# Patient Record
Sex: Female | Born: 1955
Health system: Southern US, Community
[De-identification: ages and names within clinical notes are randomized; demographics above are authoritative.]

## PROBLEM LIST (undated history)

## (undated) DIAGNOSIS — R7989 Other specified abnormal findings of blood chemistry: Secondary | ICD-10-CM

## (undated) DIAGNOSIS — Z95810 Presence of automatic (implantable) cardiac defibrillator: Secondary | ICD-10-CM

## (undated) DIAGNOSIS — I2510 Atherosclerotic heart disease of native coronary artery without angina pectoris: Secondary | ICD-10-CM

## (undated) DIAGNOSIS — I509 Heart failure, unspecified: Secondary | ICD-10-CM

## (undated) DIAGNOSIS — R519 Headache, unspecified: Secondary | ICD-10-CM

## (undated) DIAGNOSIS — F419 Anxiety disorder, unspecified: Secondary | ICD-10-CM

## (undated) DIAGNOSIS — E785 Hyperlipidemia, unspecified: Secondary | ICD-10-CM

## (undated) DIAGNOSIS — D126 Benign neoplasm of colon, unspecified: Secondary | ICD-10-CM

## (undated) DIAGNOSIS — R945 Abnormal results of liver function studies: Secondary | ICD-10-CM

## (undated) DIAGNOSIS — R413 Other amnesia: Secondary | ICD-10-CM

## (undated) DIAGNOSIS — R51 Headache: Secondary | ICD-10-CM

## (undated) DIAGNOSIS — Q2381 Bicuspid aortic valve: Secondary | ICD-10-CM

## (undated) DIAGNOSIS — Q231 Congenital insufficiency of aortic valve: Secondary | ICD-10-CM

## (undated) DIAGNOSIS — R296 Repeated falls: Secondary | ICD-10-CM

## (undated) DIAGNOSIS — I10 Essential (primary) hypertension: Secondary | ICD-10-CM

## (undated) DIAGNOSIS — R011 Cardiac murmur, unspecified: Secondary | ICD-10-CM

## (undated) DIAGNOSIS — I429 Cardiomyopathy, unspecified: Secondary | ICD-10-CM

## (undated) DIAGNOSIS — I350 Nonrheumatic aortic (valve) stenosis: Secondary | ICD-10-CM

## (undated) DIAGNOSIS — F32A Depression, unspecified: Secondary | ICD-10-CM

## (undated) DIAGNOSIS — F329 Major depressive disorder, single episode, unspecified: Secondary | ICD-10-CM

## (undated) HISTORY — DX: Benign neoplasm of colon, unspecified: D12.6

## (undated) HISTORY — DX: Nonrheumatic aortic (valve) stenosis: I35.0

## (undated) HISTORY — DX: Abnormal results of liver function studies: R94.5

## (undated) HISTORY — DX: Cardiomyopathy, unspecified: I42.9

## (undated) HISTORY — DX: Hypercalcemia: E83.52

## (undated) HISTORY — DX: Hyperlipidemia, unspecified: E78.5

## (undated) HISTORY — DX: Presence of automatic (implantable) cardiac defibrillator: Z95.810

## (undated) HISTORY — DX: Essential (primary) hypertension: I10

## (undated) HISTORY — PX: CHOLECYSTECTOMY: SHX55

## (undated) HISTORY — PX: OTHER SURGICAL HISTORY: SHX169

## (undated) HISTORY — DX: Repeated falls: R29.6

## (undated) HISTORY — DX: Other specified abnormal findings of blood chemistry: R79.89

## (undated) HISTORY — DX: Heart failure, unspecified: I50.9

## (undated) HISTORY — DX: Congenital insufficiency of aortic valve: Q23.1

## (undated) HISTORY — DX: Bicuspid aortic valve: Q23.81

## (undated) HISTORY — DX: Other amnesia: R41.3

---

## 1982-09-25 HISTORY — PX: BREAST EXCISIONAL BIOPSY: SUR124

## 1997-12-29 ENCOUNTER — Other Ambulatory Visit: Admission: RE | Admit: 1997-12-29 | Discharge: 1997-12-29 | Payer: Self-pay | Admitting: Gynecology

## 1999-04-28 ENCOUNTER — Other Ambulatory Visit: Admission: RE | Admit: 1999-04-28 | Discharge: 1999-04-28 | Payer: Self-pay | Admitting: Gynecology

## 2000-03-08 ENCOUNTER — Encounter: Payer: Self-pay | Admitting: Gynecology

## 2000-03-08 ENCOUNTER — Ambulatory Visit (HOSPITAL_COMMUNITY): Admission: RE | Admit: 2000-03-08 | Discharge: 2000-03-08 | Payer: Self-pay | Admitting: Gynecology

## 2000-07-25 ENCOUNTER — Other Ambulatory Visit: Admission: RE | Admit: 2000-07-25 | Discharge: 2000-07-25 | Payer: Self-pay | Admitting: Gynecology

## 2001-02-13 ENCOUNTER — Encounter (INDEPENDENT_AMBULATORY_CARE_PROVIDER_SITE_OTHER): Payer: Self-pay | Admitting: Specialist

## 2001-02-13 ENCOUNTER — Other Ambulatory Visit: Admission: RE | Admit: 2001-02-13 | Discharge: 2001-02-13 | Payer: Self-pay | Admitting: Gynecology

## 2001-03-11 ENCOUNTER — Encounter: Payer: Self-pay | Admitting: Gynecology

## 2001-03-11 ENCOUNTER — Ambulatory Visit (HOSPITAL_COMMUNITY): Admission: RE | Admit: 2001-03-11 | Discharge: 2001-03-11 | Payer: Self-pay | Admitting: Gynecology

## 2001-04-19 ENCOUNTER — Encounter (INDEPENDENT_AMBULATORY_CARE_PROVIDER_SITE_OTHER): Payer: Self-pay | Admitting: Specialist

## 2001-04-19 ENCOUNTER — Ambulatory Visit (HOSPITAL_COMMUNITY): Admission: RE | Admit: 2001-04-19 | Discharge: 2001-04-19 | Payer: Self-pay | Admitting: Gastroenterology

## 2001-04-19 DIAGNOSIS — D126 Benign neoplasm of colon, unspecified: Secondary | ICD-10-CM

## 2001-04-19 HISTORY — DX: Benign neoplasm of colon, unspecified: D12.6

## 2001-09-25 HISTORY — PX: OTHER SURGICAL HISTORY: SHX169

## 2002-06-19 ENCOUNTER — Inpatient Hospital Stay (HOSPITAL_COMMUNITY): Admission: EM | Admit: 2002-06-19 | Discharge: 2002-06-24 | Payer: Self-pay | Admitting: Emergency Medicine

## 2002-06-19 ENCOUNTER — Encounter: Payer: Self-pay | Admitting: Emergency Medicine

## 2002-06-20 ENCOUNTER — Encounter (INDEPENDENT_AMBULATORY_CARE_PROVIDER_SITE_OTHER): Payer: Self-pay | Admitting: Cardiology

## 2002-07-05 ENCOUNTER — Encounter: Payer: Self-pay | Admitting: *Deleted

## 2002-07-05 ENCOUNTER — Emergency Department (HOSPITAL_COMMUNITY): Admission: EM | Admit: 2002-07-05 | Discharge: 2002-07-05 | Payer: Self-pay | Admitting: *Deleted

## 2002-07-16 ENCOUNTER — Other Ambulatory Visit: Admission: RE | Admit: 2002-07-16 | Discharge: 2002-07-16 | Payer: Self-pay | Admitting: Gynecology

## 2003-03-03 ENCOUNTER — Other Ambulatory Visit: Admission: RE | Admit: 2003-03-03 | Discharge: 2003-03-03 | Payer: Self-pay | Admitting: Gynecology

## 2003-10-08 ENCOUNTER — Other Ambulatory Visit: Admission: RE | Admit: 2003-10-08 | Discharge: 2003-10-08 | Payer: Self-pay | Admitting: Gynecology

## 2004-04-18 ENCOUNTER — Ambulatory Visit (HOSPITAL_COMMUNITY): Admission: RE | Admit: 2004-04-18 | Discharge: 2004-04-18 | Payer: Self-pay | Admitting: Family Medicine

## 2004-09-05 ENCOUNTER — Ambulatory Visit: Payer: Self-pay | Admitting: Gastroenterology

## 2004-09-05 ENCOUNTER — Inpatient Hospital Stay (HOSPITAL_COMMUNITY): Admission: EM | Admit: 2004-09-05 | Discharge: 2004-09-07 | Payer: Self-pay | Admitting: Gastroenterology

## 2004-09-05 ENCOUNTER — Ambulatory Visit: Payer: Self-pay | Admitting: Internal Medicine

## 2004-09-06 ENCOUNTER — Encounter (INDEPENDENT_AMBULATORY_CARE_PROVIDER_SITE_OTHER): Payer: Self-pay | Admitting: *Deleted

## 2004-09-30 ENCOUNTER — Ambulatory Visit: Payer: Self-pay | Admitting: Internal Medicine

## 2004-10-06 ENCOUNTER — Inpatient Hospital Stay (HOSPITAL_COMMUNITY): Admission: EM | Admit: 2004-10-06 | Discharge: 2004-10-08 | Payer: Self-pay | Admitting: Emergency Medicine

## 2005-03-08 ENCOUNTER — Ambulatory Visit (HOSPITAL_COMMUNITY): Admission: RE | Admit: 2005-03-08 | Discharge: 2005-03-08 | Payer: Self-pay | Admitting: Cardiology

## 2005-03-09 ENCOUNTER — Ambulatory Visit: Payer: Self-pay | Admitting: Internal Medicine

## 2005-03-21 ENCOUNTER — Ambulatory Visit: Payer: Self-pay | Admitting: Internal Medicine

## 2005-03-24 ENCOUNTER — Inpatient Hospital Stay (HOSPITAL_COMMUNITY): Admission: RE | Admit: 2005-03-24 | Discharge: 2005-03-25 | Payer: Self-pay | Admitting: Internal Medicine

## 2005-03-24 ENCOUNTER — Ambulatory Visit: Payer: Self-pay | Admitting: Internal Medicine

## 2005-03-31 ENCOUNTER — Ambulatory Visit: Payer: Self-pay | Admitting: Cardiology

## 2005-04-12 ENCOUNTER — Ambulatory Visit: Payer: Self-pay

## 2005-04-25 ENCOUNTER — Ambulatory Visit: Payer: Self-pay

## 2005-04-28 ENCOUNTER — Ambulatory Visit: Payer: Self-pay

## 2005-05-16 ENCOUNTER — Ambulatory Visit: Payer: Self-pay | Admitting: Cardiology

## 2005-05-31 ENCOUNTER — Ambulatory Visit: Payer: Self-pay | Admitting: Cardiology

## 2005-06-02 ENCOUNTER — Ambulatory Visit: Payer: Self-pay | Admitting: Internal Medicine

## 2005-06-19 ENCOUNTER — Ambulatory Visit: Payer: Self-pay | Admitting: Internal Medicine

## 2005-07-14 ENCOUNTER — Ambulatory Visit: Payer: Self-pay | Admitting: Internal Medicine

## 2005-07-14 ENCOUNTER — Ambulatory Visit: Payer: Self-pay | Admitting: Cardiovascular Disease

## 2005-09-19 ENCOUNTER — Ambulatory Visit: Payer: Self-pay | Admitting: Internal Medicine

## 2005-10-11 ENCOUNTER — Ambulatory Visit: Payer: Self-pay | Admitting: Internal Medicine

## 2005-10-17 ENCOUNTER — Ambulatory Visit: Payer: Self-pay | Admitting: *Deleted

## 2005-11-02 ENCOUNTER — Encounter: Admission: RE | Admit: 2005-11-02 | Discharge: 2005-11-02 | Payer: Self-pay | Admitting: Family Medicine

## 2005-12-26 ENCOUNTER — Ambulatory Visit (HOSPITAL_COMMUNITY): Admission: RE | Admit: 2005-12-26 | Discharge: 2005-12-26 | Payer: Self-pay | Admitting: Family Medicine

## 2006-04-12 ENCOUNTER — Ambulatory Visit: Payer: Self-pay | Admitting: Internal Medicine

## 2006-07-12 ENCOUNTER — Ambulatory Visit: Payer: Self-pay | Admitting: Internal Medicine

## 2006-10-11 ENCOUNTER — Ambulatory Visit: Payer: Self-pay | Admitting: Internal Medicine

## 2006-11-26 ENCOUNTER — Ambulatory Visit: Payer: Self-pay | Admitting: Family Medicine

## 2006-12-10 ENCOUNTER — Ambulatory Visit: Payer: Self-pay | Admitting: Family Medicine

## 2006-12-27 ENCOUNTER — Ambulatory Visit: Payer: Self-pay | Admitting: Family Medicine

## 2007-01-04 ENCOUNTER — Ambulatory Visit: Payer: Self-pay | Admitting: Internal Medicine

## 2007-01-07 ENCOUNTER — Ambulatory Visit: Payer: Self-pay | Admitting: *Deleted

## 2007-01-09 ENCOUNTER — Ambulatory Visit: Payer: Self-pay | Admitting: Family Medicine

## 2007-01-10 ENCOUNTER — Ambulatory Visit: Payer: Self-pay | Admitting: Internal Medicine

## 2007-01-16 ENCOUNTER — Ambulatory Visit (HOSPITAL_COMMUNITY): Admission: RE | Admit: 2007-01-16 | Discharge: 2007-01-16 | Payer: Self-pay | Admitting: Family Medicine

## 2007-04-12 ENCOUNTER — Ambulatory Visit: Payer: Self-pay | Admitting: Internal Medicine

## 2007-06-27 ENCOUNTER — Ambulatory Visit: Payer: Self-pay | Admitting: Cardiology

## 2007-07-04 ENCOUNTER — Ambulatory Visit: Payer: Self-pay | Admitting: Cardiology

## 2007-07-11 ENCOUNTER — Ambulatory Visit: Payer: Self-pay | Admitting: Internal Medicine

## 2007-10-10 ENCOUNTER — Ambulatory Visit: Payer: Self-pay | Admitting: Internal Medicine

## 2007-11-13 ENCOUNTER — Ambulatory Visit: Payer: Self-pay | Admitting: Gastroenterology

## 2007-12-25 ENCOUNTER — Ambulatory Visit: Payer: Self-pay | Admitting: Gastroenterology

## 2008-01-09 ENCOUNTER — Ambulatory Visit: Payer: Self-pay | Admitting: Internal Medicine

## 2008-01-23 ENCOUNTER — Ambulatory Visit (HOSPITAL_COMMUNITY): Admission: RE | Admit: 2008-01-23 | Discharge: 2008-01-23 | Payer: Self-pay | Admitting: Family Medicine

## 2008-04-28 ENCOUNTER — Ambulatory Visit: Payer: Self-pay | Admitting: Internal Medicine

## 2008-05-05 ENCOUNTER — Ambulatory Visit: Payer: Self-pay

## 2008-05-05 ENCOUNTER — Encounter: Payer: Self-pay | Admitting: Internal Medicine

## 2008-07-30 ENCOUNTER — Ambulatory Visit: Payer: Self-pay | Admitting: Internal Medicine

## 2008-11-03 ENCOUNTER — Encounter: Payer: Self-pay | Admitting: Internal Medicine

## 2008-11-17 ENCOUNTER — Ambulatory Visit: Payer: Self-pay | Admitting: Internal Medicine

## 2009-02-12 ENCOUNTER — Telehealth: Payer: Self-pay | Admitting: Internal Medicine

## 2009-02-16 ENCOUNTER — Telehealth (INDEPENDENT_AMBULATORY_CARE_PROVIDER_SITE_OTHER): Payer: Self-pay | Admitting: *Deleted

## 2009-02-18 ENCOUNTER — Ambulatory Visit: Payer: Self-pay | Admitting: Internal Medicine

## 2009-03-12 ENCOUNTER — Encounter: Payer: Self-pay | Admitting: Internal Medicine

## 2009-03-18 ENCOUNTER — Telehealth (INDEPENDENT_AMBULATORY_CARE_PROVIDER_SITE_OTHER): Payer: Self-pay | Admitting: *Deleted

## 2009-04-13 ENCOUNTER — Telehealth: Payer: Self-pay | Admitting: Internal Medicine

## 2009-05-29 DIAGNOSIS — F17200 Nicotine dependence, unspecified, uncomplicated: Secondary | ICD-10-CM | POA: Insufficient documentation

## 2009-05-29 DIAGNOSIS — Q231 Congenital insufficiency of aortic valve: Secondary | ICD-10-CM | POA: Insufficient documentation

## 2009-05-29 DIAGNOSIS — Z862 Personal history of diseases of the blood and blood-forming organs and certain disorders involving the immune mechanism: Secondary | ICD-10-CM | POA: Insufficient documentation

## 2009-05-29 DIAGNOSIS — I359 Nonrheumatic aortic valve disorder, unspecified: Secondary | ICD-10-CM | POA: Insufficient documentation

## 2009-05-29 DIAGNOSIS — I429 Cardiomyopathy, unspecified: Secondary | ICD-10-CM | POA: Insufficient documentation

## 2009-05-29 DIAGNOSIS — I509 Heart failure, unspecified: Secondary | ICD-10-CM | POA: Insufficient documentation

## 2009-05-29 DIAGNOSIS — I10 Essential (primary) hypertension: Secondary | ICD-10-CM | POA: Insufficient documentation

## 2009-05-29 DIAGNOSIS — E785 Hyperlipidemia, unspecified: Secondary | ICD-10-CM | POA: Insufficient documentation

## 2009-05-29 DIAGNOSIS — Z8719 Personal history of other diseases of the digestive system: Secondary | ICD-10-CM | POA: Insufficient documentation

## 2009-05-29 DIAGNOSIS — Z8639 Personal history of other endocrine, nutritional and metabolic disease: Secondary | ICD-10-CM

## 2009-06-02 ENCOUNTER — Encounter: Payer: Self-pay | Admitting: Internal Medicine

## 2009-06-02 ENCOUNTER — Ambulatory Visit: Payer: Self-pay

## 2009-06-02 ENCOUNTER — Ambulatory Visit: Payer: Self-pay | Admitting: Internal Medicine

## 2009-06-02 DIAGNOSIS — Z95810 Presence of automatic (implantable) cardiac defibrillator: Secondary | ICD-10-CM | POA: Insufficient documentation

## 2009-06-10 ENCOUNTER — Ambulatory Visit (HOSPITAL_COMMUNITY): Admission: RE | Admit: 2009-06-10 | Discharge: 2009-06-10 | Payer: Self-pay | Admitting: Family Medicine

## 2009-08-19 ENCOUNTER — Ambulatory Visit: Payer: Self-pay | Admitting: Internal Medicine

## 2009-08-20 ENCOUNTER — Encounter: Payer: Self-pay | Admitting: Internal Medicine

## 2009-11-19 ENCOUNTER — Encounter: Payer: Self-pay | Admitting: Internal Medicine

## 2009-12-02 ENCOUNTER — Ambulatory Visit: Payer: Self-pay | Admitting: Internal Medicine

## 2010-01-24 ENCOUNTER — Telehealth (INDEPENDENT_AMBULATORY_CARE_PROVIDER_SITE_OTHER): Payer: Self-pay | Admitting: *Deleted

## 2010-02-14 ENCOUNTER — Telehealth (INDEPENDENT_AMBULATORY_CARE_PROVIDER_SITE_OTHER): Payer: Self-pay | Admitting: *Deleted

## 2010-03-10 ENCOUNTER — Ambulatory Visit: Payer: Self-pay | Admitting: Internal Medicine

## 2010-03-30 ENCOUNTER — Encounter: Payer: Self-pay | Admitting: Internal Medicine

## 2010-06-14 ENCOUNTER — Ambulatory Visit (HOSPITAL_COMMUNITY): Admission: RE | Admit: 2010-06-14 | Discharge: 2010-06-14 | Payer: Self-pay | Admitting: Family Medicine

## 2010-06-20 ENCOUNTER — Telehealth: Payer: Self-pay | Admitting: Internal Medicine

## 2010-06-22 ENCOUNTER — Encounter: Admission: RE | Admit: 2010-06-22 | Discharge: 2010-06-22 | Payer: Self-pay | Admitting: Family Medicine

## 2010-06-29 ENCOUNTER — Ambulatory Visit: Payer: Self-pay | Admitting: Internal Medicine

## 2010-09-29 ENCOUNTER — Ambulatory Visit
Admission: RE | Admit: 2010-09-29 | Discharge: 2010-09-29 | Payer: Self-pay | Source: Home / Self Care | Attending: Internal Medicine | Admitting: Internal Medicine

## 2010-09-29 ENCOUNTER — Encounter: Payer: Self-pay | Admitting: Internal Medicine

## 2010-09-29 ENCOUNTER — Telehealth: Payer: Self-pay | Admitting: Internal Medicine

## 2010-10-15 ENCOUNTER — Encounter: Payer: Self-pay | Admitting: Family Medicine

## 2010-10-16 ENCOUNTER — Encounter: Payer: Self-pay | Admitting: Family Medicine

## 2010-10-16 ENCOUNTER — Encounter: Payer: Self-pay | Admitting: Cardiology

## 2010-10-18 ENCOUNTER — Encounter: Payer: Self-pay | Admitting: Internal Medicine

## 2010-10-18 ENCOUNTER — Other Ambulatory Visit: Payer: Self-pay | Admitting: Internal Medicine

## 2010-10-18 ENCOUNTER — Ambulatory Visit
Admission: RE | Admit: 2010-10-18 | Discharge: 2010-10-18 | Payer: Self-pay | Source: Home / Self Care | Attending: Internal Medicine | Admitting: Internal Medicine

## 2010-10-19 LAB — PROTIME-INR
INR: 2.2 ratio — ABNORMAL HIGH (ref 0.8–1.0)
Prothrombin Time: 22.8 s — ABNORMAL HIGH (ref 10.2–12.4)

## 2010-10-19 LAB — CBC WITH DIFFERENTIAL/PLATELET
Basophils Absolute: 0 10*3/uL (ref 0.0–0.1)
Basophils Relative: 0.5 % (ref 0.0–3.0)
Eosinophils Absolute: 0 10*3/uL (ref 0.0–0.7)
Eosinophils Relative: 0.5 % (ref 0.0–5.0)
HCT: 44.9 % (ref 36.0–46.0)
Hemoglobin: 15 g/dL (ref 12.0–15.0)
Lymphocytes Relative: 53.1 % — ABNORMAL HIGH (ref 12.0–46.0)
Lymphs Abs: 2.7 10*3/uL (ref 0.7–4.0)
MCHC: 33.5 g/dL (ref 30.0–36.0)
MCV: 92.3 fl (ref 78.0–100.0)
Monocytes Absolute: 0.5 10*3/uL (ref 0.1–1.0)
Monocytes Relative: 9.7 % (ref 3.0–12.0)
Neutro Abs: 1.8 10*3/uL (ref 1.4–7.7)
Neutrophils Relative %: 36.2 % — ABNORMAL LOW (ref 43.0–77.0)
Platelets: 113 10*3/uL — ABNORMAL LOW (ref 150.0–400.0)
RBC: 4.86 Mil/uL (ref 3.87–5.11)
RDW: 15.5 % — ABNORMAL HIGH (ref 11.5–14.6)
WBC: 5.1 10*3/uL (ref 4.5–10.5)

## 2010-10-19 LAB — BASIC METABOLIC PANEL
BUN: 22 mg/dL (ref 6–23)
CO2: 27 mEq/L (ref 19–32)
Calcium: 10 mg/dL (ref 8.4–10.5)
Chloride: 107 mEq/L (ref 96–112)
Creatinine, Ser: 0.9 mg/dL (ref 0.4–1.2)
GFR: 83.08 mL/min (ref >60.00)
Glucose, Bld: 96 mg/dL (ref 70–99)
Potassium: 3.9 mEq/L (ref 3.5–5.1)
Sodium: 140 mEq/L (ref 135–145)

## 2010-10-19 LAB — APTT: aPTT: 44.6 s — ABNORMAL HIGH (ref 21.7–28.8)

## 2010-10-25 ENCOUNTER — Ambulatory Visit (HOSPITAL_COMMUNITY)
Admission: RE | Admit: 2010-10-25 | Discharge: 2010-10-25 | Payer: Self-pay | Source: Home / Self Care | Attending: Internal Medicine | Admitting: Internal Medicine

## 2010-10-25 ENCOUNTER — Encounter: Payer: Self-pay | Admitting: Internal Medicine

## 2010-10-25 LAB — SURGICAL PCR SCREEN
MRSA, PCR: NEGATIVE
Staphylococcus aureus: NEGATIVE

## 2010-10-25 LAB — PROTIME-INR
INR: 2.05 — ABNORMAL HIGH (ref 0.00–1.49)
Prothrombin Time: 23.3 seconds — ABNORMAL HIGH (ref 11.6–15.2)

## 2010-10-27 NOTE — Letter (Signed)
Summary: Device-Delinquent Phone Proofreader, [place]  [address] [place]   [place], [place] [address]   Phone: [phone]  Fax: [phone]     November 19, 2009 MRN: [id number]   [name] [place], [place]  [address]   Dear Ms. Dorian,  According to our records, you were scheduled for a device phone transmission on   November 18, 2009.     We did not receive any results from this check.  If you transmitted on your scheduled day, please call us to help troubleshoot your system.  If you forgot to send your transmission, please send one upon receipt of this letter.  Thank you,   [facility]

## 2010-10-27 NOTE — Progress Notes (Signed)
   Walk in Patient Form Recieved 02/16/09 patient needs Disability forms filled out sent over to [facility] for her to Complete "PLease call patient when ready she will pick up"  [facility]  Feb 16, 2009 12:10 PM

## 2010-10-27 NOTE — Progress Notes (Signed)
Summary: DENTAL QUESTION  Phone Note Call from Patient Call back at Home Phone [phone]   Reason for Call: Talk to Nurse Summary of Call: [place], .  Initial call taken by: Neil Crouch,  April 13, 2009 2:23 PM  Follow-up for Phone Call        pt wanted to know if I knew of a dentist that did dental work for people with no insurance that wouldn't charge so much.  I told her that I was not sure but that she could call [facility] and ask them and they maybe could guide her in the right direction. Janan Halter, RN, BSN  April 13, 2009 5:13 PM

## 2010-10-27 NOTE — Cardiovascular Report (Signed)
Summary: Office Visit  Office Visit   Imported By: Marilynne Drivers 06/18/2009 10:21:39  _____________________________________________________________________  External Attachment:    Type:   Image     Comment:   External Document

## 2010-10-27 NOTE — Letter (Signed)
Summary: Remote Device Check  [place], [place]  [id number] [place]   [place], [place] [address]   Phone: [phone]  Fax: [phone]     March 12, 2009 MRN: [id number]   [place] [place], [place]  [address]   Dear Ms. Corter,   Your remote transmission was recieved and reviewed by your physician.  All diagnostics were within normal limits for you.    ___X___Your next office visit is scheduled for:  August with Dr. Lovena Le with repeat echocardiogram. Please call our office to schedule an appointment.    Sincerely,  Hotel manager

## 2010-10-27 NOTE — Cardiovascular Report (Signed)
Summary: Office Visit Remote   Office Visit Remote   Imported By: Sallee Provencal 04/05/2010 13:34:21  _____________________________________________________________________  External Attachment:    Type:   Image     Comment:   External Document

## 2010-10-27 NOTE — Cardiovascular Report (Signed)
Summary: Office Visit Remote   Office Visit Remote   Imported By: Sallee Provencal 12/23/2009 11:56:16  _____________________________________________________________________  External Attachment:    Type:   Image     Comment:   External Document

## 2010-10-27 NOTE — Progress Notes (Signed)
  Pt dropped off papers to be completed from [place] for [place] sent to [facility] along w/ disability Packet she completed. Joelene Millin [place]ore  Feb 14, 2010 4:07 PM

## 2010-10-27 NOTE — Assessment & Plan Note (Signed)
Summary: 1 yr fu/mt   History of Present Illness: [name] returns today for ICD followup.  She is a very pleasant middle-aged woman with a history of nonischemic cardiomyopathy and left bundle-branch block and congestive heart failure, who is status post BiV  ICD insertion in 2006.  She returns today for followup.  She has donewell in the interim since her last visit a year ago.  She denies c/p, sob peripheral edema or any intercurrent ICD therapies.  She has moderate AS by echo.  She notes some non-exertional discomfort in her feet.  Current Medications (verified): 1)  Aldactone 25 Mg Tabs (Spironolactone) .Marland Kitchen.. 1 By Mouth Once Daily 2)  Coreg 25 Mg Tabs (Carvedilol) .... Take 1 Tablet By Mouth Twice A Day 3)  Bidil 20-37.5 Mg Tabs (Isosorb Dinitrate-Hydralazine) .Marland Kitchen.. 1 By Mouth Two Times A Day 4)  Benicar 20 Mg Tabs (Olmesartan Medoxomil) .... 1/2 By Mouth Once Daily 5)  Furosemide 40 Mg Tabs (Furosemide) .... Take One Tablet By Mouth Every Other Day 6)  Crestor 10 Mg Tabs (Rosuvastatin Calcium) .... Take One Tablet By Mouth Daily. 7)  Coumadin 5 Mg Tabs (Warfarin Sodium) .... As Directed  Allergies: No Known Drug Allergies  Past History:  Past Medical History: Last updated: 05/29/2009 AORTIC STENOSIS (ICD-424.1) CARDIOMYOPATHY, SECONDARY (ICD-425.9) SMOKER (ICD-305.1) HYPERLIPIDEMIA (ICD-272.4) HYPERTENSION (ICD-401.9) BICUSPID AORTIC VALVE (ICD-746.4) LIVER FUNCTION TESTS, ABNORMAL, HX OF (ICD-V12.2) CHF (ICD-428.0) CHOLECYSTITIS, HX OF (ICD-V12.70)  Past Surgical History: Last updated: 05/29/2009  Laparoscopic cholecystectomy with intraoperative cholangiogram. implantation of a Guidant  biventricular ICD   03/24/2005 Champ Mungo. Lovena Le, M.D.  Review of Systems  The patient denies chest pain, syncope, dyspnea on exertion, and peripheral edema.    Vital Signs:  Patient profile:   55 year old female Height:      65 inches Weight:      127 pounds BMI:     21.21 Pulse  rate:   78 / minute Resp:     14 per minute BP sitting:   121 / 73  (left arm)  Vitals Entered By: Tiffany Basque, [name] (June 29, 2010 12:23 PM)  Physical Exam  General:  Well developed, well nourished, in no acute distress. Head:  normocephalic and atraumatic Eyes:  PERRLA/EOM intact; conjunctiva and lids normal. Mouth:  Teeth, gums and palate normal. Oral mucosa normal. Neck:  Neck supple, no JVD. No masses, thyromegaly or abnormal cervical nodes. Chest Wall:  Well healed ICD incision. Lungs:  Clear bilaterally with no wheezes, rales, or rhonchi or increased work of breathing. Heart:  RRR with normal S1. S2 is reduced but present.  A 3/6 murmur of AS is noted.  Abdomen:  Bowel sounds positive; abdomen soft and non-tender without masses, organomegaly, or hernias noted. No hepatosplenomegaly. Msk:  Back normal, normal gait. Muscle strength and tone normal. Pulses:  pulses normal in all 4 extremities Extremities:  No clubbing or cyanosis. Neurologic:  Alert and oriented x 3.   EKG  Procedure date:  06/29/2010  Findings:      Normal sinus rhythm with rate of:  78. BiV pacing is present.   ICD Specifications Following MD:  Cristopher Peru, MD     ICD Vendor:  [place] Number:  [id number]     ICD Serial Number:  [id number] ICD DOI:  03/24/2005      Lead 1:    Location: RA     DOI: 03/24/2005     Model #: [id number]  Serial #: [id number]     Status: active Lead 2:    Location: RV     DOI: 03/24/2005     Model #: [id number]     Serial #: [id number]     Status: active Lead 3:    Location: LV     DOI: 03/24/2005     Model #: [id number]     Serial #: [id number]     Status: active  Indications::  NICM  Explantation Comments: Latitude  ICD Follow Up Battery Voltage:  2.54 V     Charge Time:  8.4 seconds     Underlying rhythm:  SR   ICD Device Measurements Atrium:  Amplitude: 4.5 mV, Impedance: 511 ohms, Threshold: 1.0 V at 0.5 msec Right Ventricle:  Amplitude: 14.0 mV, Impedance: 656 ohms,  Threshold: 0.8 V at 0.4 msec Left Ventricle:  Amplitude: 11.0 mV, Impedance: 468 ohms, Threshold: 1.0 V at 0.4 msec Shock Impedance: 44 ohms   Episodes MS Episodes:  1     Coumadin:  Yes Shock:  0     ATP:  0     Nonsustained:  0     Atrial Therapies:  0 Atrial Pacing:  0%     Ventricular Pacing:  100%  Brady Parameters Mode DDD     Lower Rate Limit:  40     Upper Rate Limit 120 PAV 120      Tachy Zones VF:  250     VT:  200     VT1:  175     Next Remote Date:  09/29/2010     Next Cardiology Appt Due:  06/26/2011 Tech Comments:  NORMAL DEVICE FUNCTION.  NO EPISODES SINCE LAST CHECK.  BATTERY VOLTAGE 2.54 V--WILL MONITOR W/LATITUDE TRANSMISSIONS. NO CHANGES MADE. LATITUDE TRANSMISSION 09-29-09 AND ROV IN 12 MTHS W/GT. Shelly Bombard  June 29, 2010 1:17 PM MD Comments:  Agree with above.  Normal device function.  Impression & Recommendations:  Problem # 1:  AUTOMATIC IMPLANTABLE CARDIAC DEFIBRILLATOR SITU (ICD-V45.02) Her device is working normally.  She is approaching ERI.  I will see her back in a year unless the device is in need of genertor change.  Problem # 2:  AORTIC STENOSIS (ICD-424.1) Her exam suggests moderate AS.  I will recheck her 2D echo next year. Her updated medication list for this problem includes:    Aldactone 25 Mg Tabs (Spironolactone) .Marland Kitchen... 1 by mouth once daily    Coreg 25 Mg Tabs (Carvedilol) .Marland Kitchen... Take 1 tablet by mouth twice a day    Benicar 20 Mg Tabs (Olmesartan medoxomil) .Marland Kitchen... 1/2 by mouth once daily    Furosemide 40 Mg Tabs (Furosemide) .Marland Kitchen... Take one tablet by mouth every other day  Problem # 3:  CHF (ICD-428.0) Her symptoms remain class 2.  A low sodium diet and current medications are requested. Her updated medication list for this problem includes:    Aldactone 25 Mg Tabs (Spironolactone) .Marland Kitchen... 1 by mouth once daily    Coreg 25 Mg Tabs (Carvedilol) .Marland Kitchen... Take 1 tablet by mouth twice a day    Benicar 20 Mg Tabs (Olmesartan medoxomil) .Marland Kitchen... 1/2 by  mouth once daily    Furosemide 40 Mg Tabs (Furosemide) .Marland Kitchen... Take one tablet by mouth every other day    Coumadin 5 Mg Tabs (Warfarin sodium) .Marland Kitchen... As directed  Other Orders: EKG w/ Interpretation (93000)  Patient Instructions: 1)  Your physician recommends that you continue on your current medications as directed. Please refer  to the Current Medication list given to you today. 2)  Your physician wants you to follow-up in: 1year  You will receive a reminder letter in the mail two months in advance. If you don't receive a letter, please call our office to schedule the follow-up appointment.

## 2010-10-27 NOTE — Progress Notes (Signed)
Summary: REQUEST CALL  Phone Note Call from Patient Call back at Home Phone [phone]   Caller: Patient Summary of Call: PT REQUEST CALL Initial call taken by: Delsa Sale,  September 29, 2010 12:54 PM  Follow-up for Phone Call        lmom to return call Janan Halter, RN, BSN  September 29, 2010 1:58 PM she sent lattitude transmission 09/28/2010 Janan Halter, RN, BSN  September 29, 2010 2:24 PM

## 2010-10-27 NOTE — Progress Notes (Signed)
Summary: pt needs disablity forms   Phone Note Call from Patient Call back at Home Phone [phone]   Caller: Patient Reason for Call: Talk to Nurse, Insurance Question Summary of Call: pt called to get disability paperwork. not at front desk Ms. Rachal sent it back over on the 17th. Initial call taken by: Shelda Pal,  March 18, 2009 9:15 AM  Follow-up for Phone Call        [place]. [id number] Neil Crouch  March 30, 2009 12:03 PM    Additional Follow-up for Phone Call Additional follow up Details #1::        This message just appeared on desk top today [facility] for pt to call if she hadnt gotten her paperwork Janan Halter, RN, BSN  April 16, 2009 10:03 AM

## 2010-10-27 NOTE — Assessment & Plan Note (Signed)
Summary: 6 MO F/U DF2   Visit Type:  Follow-up  CC:  No concerns .  History of Present Illness: [name] returns today for ICD followup.  She is a very pleasant middle-aged woman with a history of nonischemic cardiomyopathy and left bundle-branch block and congestive heart failure, who is status post BiV  ICD insertion in 2006.  She returns today for followup.  She has done well in the interim since her last visit a year ago.  She denies c/p, sob peripheral edema or any intercurrent ICD therapies.  Current Medications (verified): 1)  Aldactone 25 Mg Tabs (Spironolactone) .Marland Kitchen.. 1 By Mouth Once Daily 2)  Coreg 25 Mg Tabs (Carvedilol) .... Take 1 Tablet By Mouth Twice A Day 3)  Bidil 20-37.5 Mg Tabs (Isosorb Dinitrate-Hydralazine) .Marland Kitchen.. 1 By Mouth Once Daily 4)  Benicar 20 Mg Tabs (Olmesartan Medoxomil) .... 1/2 By Mouth Once Daily 5)  Furosemide 40 Mg Tabs (Furosemide) .... Take One Tablet By Mouth Every Other Day 6)  Crestor 10 Mg Tabs (Rosuvastatin Calcium) .... Take One Tablet By Mouth Daily. 7)  Coumadin 5 Mg Tabs (Warfarin Sodium) .... As Directed  Allergies (verified): No Known Drug Allergies  Past History:  Past Medical History: Last updated: 05/29/2009 AORTIC STENOSIS (ICD-424.1) CARDIOMYOPATHY, SECONDARY (ICD-425.9) SMOKER (ICD-305.1) HYPERLIPIDEMIA (ICD-272.4) HYPERTENSION (ICD-401.9) BICUSPID AORTIC VALVE (ICD-746.4) LIVER FUNCTION TESTS, ABNORMAL, HX OF (ICD-V12.2) CHF (ICD-428.0) CHOLECYSTITIS, HX OF (ICD-V12.70)  Past Surgical History: Last updated: 05/29/2009  Laparoscopic cholecystectomy with intraoperative cholangiogram. implantation of a Guidant  biventricular ICD   03/24/2005 Champ Mungo. Lovena Le, M.D.  Review of Systems  The patient denies chest pain, syncope, dyspnea on exertion, and peripheral edema.    Vital Signs:  Patient profile:   55 year old female Height:      65 inches Weight:      123.31 pounds BMI:     20.59 Pulse rate:   76 /  minute Pulse rhythm:   regular BP sitting:   124 / 76  (left arm) Cuff size:   regular  Vitals Entered By: Darrick Meigs (June 02, 2009 12:24 PM)  Physical Exam  General:  Well developed, well nourished, in no acute distress. Head:  normocephalic and atraumatic Eyes:  PERRLA/EOM intact; conjunctiva and lids normal. Mouth:  Teeth, gums and palate normal. Oral mucosa normal. Neck:  Neck supple, no JVD. No masses, thyromegaly or abnormal cervical nodes. Chest Wall:  Well healed ICD incision. Lungs:  Clear bilaterally with no wheezes, rales, or rhonchi or increased work of breathing. Heart:  RRR with normal S1 and S2.  A 3/6 murmur of AS is noted. Abdomen:  Bowel sounds positive; abdomen soft and non-tender without masses, organomegaly, or hernias noted. No hepatosplenomegaly. Msk:  Back normal, normal gait. Muscle strength and tone normal. Pulses:  pulses normal in all 4 extremities Extremities:  No clubbing or cyanosis. Neurologic:  Alert and oriented x 3.    ICD Specifications Following MD:  Cristopher Peru, MD     ICD Vendor:  [place]     ICD Model Number:  H210     ICD Serial Number:  [id number] ICD DOI:  03/24/2005      Lead 1:    Location: RA     DOI: 03/24/2005     Model #: [id number]     Serial #: [id number]     Status: active Lead 2:    Location: RV     DOI: 03/24/2005     Model #: [id number]  Serial #: [id number]     Status: active Lead 3:    Location: LV     DOI: 03/24/2005     Model #: [id number]     Serial #: [id number]     Status: active  Indications::  NICM  Explantation Comments: Latitude  ICD Follow Up Remote Check?  No Battery Voltage:  2.56 V     Charge Time:  7.4 seconds     Battery Est. Longevity:  15% Underlying rhythm:  SR@74    ICD Device Measurements Atrium:  Amplitude: 6.6 mV, Impedance: 517 ohms, Threshold: 1.0 V at 0.5 msec Right Ventricle:  Amplitude: 16.1 mV, Impedance: 630 ohms, Threshold: 0.8 V at 0.4 msec Left Ventricle:  Amplitude: 15.8 mV, Impedance: 500 ohms,  Threshold: 0.8 V at 0.4 msec Shock Impedance: 44 ohms   Episodes MS Episodes:  0     Percent Mode Switch:  0     Coumadin:  Yes Shock:  0     ATP:  0     Nonsustained:  0     Atrial Pacing:  0%     Ventricular Pacing:  100%  Brady Parameters Mode DDD     Lower Rate Limit:  40     Upper Rate Limit 120 PAV 120      Tachy Zones VF:  250     VT:  200     VT1:  175     Next Remote Date:  09/02/2009     Next Cardiology Appt Due:  05/26/2010 Tech Comments:  Battery near ERI, tones demonstrated.  She will send a Latitude transmission every 3 months with a return office visit in one year with Dr. Lovena Le. Alma Friendly, LPN  September  8, 624THL 12:33 PM  MD Comments:  Agree with above.  We turned her outputs down slightly to maximize battery longevity.  Impression & Recommendations:  Problem # 1:  AUTOMATIC IMPLANTABLE CARDIAC DEFIBRILLATOR SITU (ICD-V45.02) Her device is working normally though she is approaching ERI.  Will followup in several months.  Problem # 2:  CHF (ICD-428.0) Her chronic systolic CHF is well compensated.  Continue current meds as noted below.  A low sodium diet is encouraged. Her updated medication list for this problem includes:    Aldactone 25 Mg Tabs (Spironolactone) .Marland Kitchen... 1 by mouth once daily    Coreg 25 Mg Tabs (Carvedilol) .Marland Kitchen... Take 1 tablet by mouth twice a day    Benicar 20 Mg Tabs (Olmesartan medoxomil) .Marland Kitchen... 1/2 by mouth once daily    Furosemide 40 Mg Tabs (Furosemide) .Marland Kitchen... Take one tablet by mouth every other day    Coumadin 5 Mg Tabs (Warfarin sodium) .Marland Kitchen... As directed  Problem # 3:  AORTIC STENOSIS (ICD-424.1) Her valve does not appear to be tight by exam.  She will need a followup echo in the next year. Her updated medication list for this problem includes:    Aldactone 25 Mg Tabs (Spironolactone) .Marland Kitchen... 1 by mouth once daily    Coreg 25 Mg Tabs (Carvedilol) .Marland Kitchen... Take 1 tablet by mouth twice a day    Benicar 20 Mg Tabs (Olmesartan medoxomil) .Marland Kitchen... 1/2  by mouth once daily    Furosemide 40 Mg Tabs (Furosemide) .Marland Kitchen... Take one tablet by mouth every other day

## 2010-10-27 NOTE — Letter (Signed)
Summary: Implantable Device Instructions  Press photographer, [place]  [id number] [place]   [place], [place] [address]   Phone: [phone]  Fax: [phone]      Implantable Device Instructions  You are scheduled for:   _____ Generator Change  on  10/25/10 with Dr. Lovena Le.  1.  Please arrive at the [facility] at 5:30am on the day of your procedure.  2.  Do not eat or drink after midnight the night before your procedure.  3.  Complete lab work on 10/18/10.   You do not have to be fasting.  4.  Do NOT take these medications for the morningof your procedure:  Furosemide.  Take your last dose of Coumadin on --I will call you if you need to stop(usually do not stop)  5.  Plan for an overnight stay.  Bring your insurance cards and a list of your medications.  6.  Wash your chest and neck with antibacterial soap (any brand) the evening before and the morning of your procedure.  Rinse well.   *If you have ANY questions after you get home, please call the office [phone]. Leonia Reader  *Every attempt is made to prevent procedures from being rescheduled.  Due to the nauture of Electrophysiology, rescheduling can happen.  The physician is always aware and directs the staff when this occurs.

## 2010-10-27 NOTE — Cardiovascular Report (Signed)
Summary: Office Visit Remote  Office Visit Remote   Imported By: Sallee Provencal 09/02/2009 16:13:02  _____________________________________________________________________  External Attachment:    Type:   Image     Comment:   External Document

## 2010-10-27 NOTE — Progress Notes (Signed)
Summary: refill bidil per GT  Medications Added COREG 25 MG TABS (CARVEDILOL) Take 1 tablet by mouth twice a day BIDIL 20-37.5 MG TABS (ISOSORB DINITRATE-HYDRALAZINE) 1 by mouth once daily       Phone Note Refill Request Call back at Home Phone [phone] Message from:  Patient  Refills Requested: Medication #1:  BIDIL   Supply Requested: 1 month PATIENT WILL PICK UP RX. CALL WHEN READY   Method Requested: Pick up at Office Initial call taken by: Alexander Bergeron,  Feb 12, 2009 9:34 AM  Follow-up for Phone Call        PT CALLED AND ASKING ABT THE PRESCRIPTION REQUEST. PLS CALL PT WHEN IT IS READY TO PICK UP.  Follow-up by: Malena Edman,  Feb 15, 2009 10:15 AM    New/Updated Medications: COREG 25 MG TABS (CARVEDILOL) Take 1 tablet by mouth twice a day BIDIL 20-37.5 MG TABS (ISOSORB DINITRATE-HYDRALAZINE) 1 by mouth once daily   Prescriptions: BIDIL 20-37.5 MG TABS (ISOSORB DINITRATE-HYDRALAZINE) 1 by mouth once daily  #30 x 6   Entered by:   Darrick Meigs   Authorized by:   Sophronia Simas, MD, [facility]   Signed by:   Darrick Meigs on 02/17/2009   Method used:   Electronically to        [facility] (retail)       [phone]       [place], [place]  [address]       Ph: [phone]       Fax: [phone]   RxID:   [id number]

## 2010-10-27 NOTE — Cardiovascular Report (Signed)
Summary: Office Visit Remote   Office Visit Remote   Imported By: Sallee Provencal 10/14/2010 11:05:24  _____________________________________________________________________  External Attachment:    Type:   Image     Comment:   External Document

## 2010-10-27 NOTE — Letter (Signed)
Summary: Remote Device Check  [place], [place]  [id number] [place]   [place], [place] [address]   Phone: [phone]  Fax: [phone]     March 30, 2010 MRN: [id number]   [name] [name], [place]  [address]   Dear Ms. Stryker,   Your remote transmission was recieved and reviewed by your physician.  All diagnostics were within normal limits for you.   __X____Your next office visit is scheduled for:   September 2011. Please call our office to schedule an appointment.    Sincerely,  Shelly Bombard

## 2010-10-27 NOTE — Assessment & Plan Note (Signed)
Summary: reached eri/mt   Visit Type:  Follow-up   History of Present Illness: [name] returns today for ICD followup.  She is a very pleasant middle-aged woman with a history of nonischemic cardiomyopathy and left bundle-branch block and congestive heart failure, who is status post BiV  ICD insertion in 2006.  She returns today for followup.  She has done well in the interim since her last visit a year ago.  She denies c/p, sob peripheral edema or any intercurrent ICD therapies.  She has moderate AS by echo.  She notes some non-exertional discomfort in her feet. She is at [facility].    Current Medications (verified): 1)  Aldactone 25 Mg Tabs (Spironolactone) .Marland Kitchen.. 1 By Mouth Once Daily 2)  Coreg 25 Mg Tabs (Carvedilol) .... Take 1 Tablet By Mouth Twice A Day 3)  Bidil 20-37.5 Mg Tabs (Isosorb Dinitrate-Hydralazine) .Marland Kitchen.. 1 By Mouth Two Times A Day 4)  Benicar 20 Mg Tabs (Olmesartan Medoxomil) .... 1/2 By Mouth Once Daily 5)  Furosemide 40 Mg Tabs (Furosemide) .... Take One Tablet By Mouth Every Other Day 6)  Crestor 10 Mg Tabs (Rosuvastatin Calcium) .... Take One Tablet By Mouth Daily. 7)  Coumadin 5 Mg Tabs (Warfarin Sodium) .... As Directed  Allergies (verified): No Known Drug Allergies  Past History:  Past Medical History: Last updated: 05/29/2009 AORTIC STENOSIS (ICD-424.1) CARDIOMYOPATHY, SECONDARY (ICD-425.9) SMOKER (ICD-305.1) HYPERLIPIDEMIA (ICD-272.4) HYPERTENSION (ICD-401.9) BICUSPID AORTIC VALVE (ICD-746.4) LIVER FUNCTION TESTS, ABNORMAL, HX OF (ICD-V12.2) CHF (ICD-428.0) CHOLECYSTITIS, HX OF (ICD-V12.70)  Past Surgical History: Last updated: 05/29/2009  Laparoscopic cholecystectomy with intraoperative cholangiogram. implantation of a Guidant  biventricular ICD   03/24/2005 Champ Mungo. Lovena Le, M.D.  Review of Systems  The patient denies chest pain, syncope, dyspnea on exertion, and peripheral edema.    Vital Signs:  Patient profile:   55 year old female Height:       65 inches Weight:      125 pounds BMI:     20.88 Pulse rate:   76 / minute BP sitting:   106 / 60  (left arm)  Vitals Entered By: [name] CMA (October 18, 2010 2:58 PM)  Physical Exam  General:  Well developed, well nourished, in no acute distress. Head:  normocephalic and atraumatic Eyes:  PERRLA/EOM intact; conjunctiva and lids normal. Mouth:  Teeth, gums and palate normal. Oral mucosa normal. Neck:  Neck supple, no JVD. No masses, thyromegaly or abnormal cervical nodes. Chest Wall:  Well healed ICD incision. Lungs:  Clear bilaterally with no wheezes, rales, or rhonchi or increased work of breathing. Heart:  RRR with normal S1. S2 is reduced but present.  A 3/6 murmur of AS is noted.  Abdomen:  Bowel sounds positive; abdomen soft and non-tender without masses, organomegaly, or hernias noted. No hepatosplenomegaly. Msk:  Back normal, normal gait. Muscle strength and tone normal. Pulses:  pulses normal in all 4 extremities Extremities:  No clubbing or cyanosis. Neurologic:  Alert and oriented x 3.    ICD Specifications Following MD:  Cristopher Peru, MD     ICD Vendor:  [place] Number:  [id number]     ICD Serial Number:  [id number] ICD DOI:  03/24/2005      Lead 1:    Location: RA     DOI: 03/24/2005     Model #: [id number]     Serial #: [id number]     Status: active Lead 2:    Location: RV  DOI: 03/24/2005     Model #: [id number]     Serial #: [id number]     Status: active Lead 3:    Location: LV     DOI: 03/24/2005     Model #: [id number]     Serial #: [id number]     Status: active  Indications::  NICM  Explantation Comments: Latitude  Episodes Coumadin:  Yes  Brady Parameters Mode DDD     Lower Rate Limit:  40     Upper Rate Limit 120 PAV 120      Tachy Zones VF:  250     VT:  200     VT1:  175     MD Comments:  Her device is at [facility]. Will schedule generator change.  Impression & Recommendations:  Problem # 1:  AUTOMATIC IMPLANTABLE CARDIAC DEFIBRILLATOR SITU (ICD-V45.02) Her  device is at ERI. will schedule gen change. Orders: TLB-BMP (Basic Metabolic Panel-BMET) (99991111) TLB-CBC Platelet - w/Differential (85025-CBCD) TLB-PT (Protime) (85610-PTP) TLB-PTT (85730-PTTL)  Problem # 2:  HYPERTENSION (ICD-401.9) Her blood pressure is well controlled. Continue her current meds. Her updated medication list for this problem includes:    Aldactone 25 Mg Tabs (Spironolactone) .Marland Kitchen... 1 by mouth once daily    Coreg 25 Mg Tabs (Carvedilol) .Marland Kitchen... Take 1 tablet by mouth twice a day    Benicar 20 Mg Tabs (Olmesartan medoxomil) .Marland Kitchen... 1/2 by mouth once daily    Furosemide 40 Mg Tabs (Furosemide) .Marland Kitchen... Take one tablet by mouth every other day  Orders: TLB-BMP (Basic Metabolic Panel-BMET) (99991111) TLB-CBC Platelet - w/Differential (85025-CBCD) TLB-PT (Protime) (85610-PTP) TLB-PTT (85730-PTTL)  Patient Instructions: 1)  Schedule generator change for 10/25/10 at 7:30am

## 2010-10-27 NOTE — Progress Notes (Signed)
Summary: pt has questions regarding defib and her vacation  Phone Note Call from Patient Call back at Home Phone [phone]   Caller: Patient Reason for Call: Talk to Nurse, Talk to Doctor Summary of Call: pt is going on vacation 5/13 -5/16 and she has a low battery and she has a few questions prior to her going Initial call taken by: Shelda Pal,  Jan 24, 2010 11:42 AM  Follow-up for Phone Call        The patient was concerned about going on vacation with the battery near ERI.  Instructed her to not worry if she does reach ERI while away she has app. 3 months remaining battery life.  She can call us when she gets back and we'll turn off the alert and set her up for a change out.  Follow-up by: Alma Friendly, LPN,  May  2, [id number] PM

## 2010-10-27 NOTE — Miscellaneous (Signed)
Summary: Device preload  Clinical Lists Changes  Observations: Added new observation of ICDEXPLCOMM: Latitude (11/03/2008 16:28) Added new observation of ICD INDICATN: NICM (11/03/2008 16:28) Added new observation of ICDLEADSTAT3: active (11/03/2008 16:28) Added new observation of ICDLEADSER3: [id number]  (11/03/2008 16:28) Added new observation of ICDLEADMOD3: [id number]  (11/03/2008 16:28) Added new observation of ICDLEADDOI3: 03/24/2005  (11/03/2008 16:28) Added new observation of ICDLEADLOC3: LV  (11/03/2008 16:28) Added new observation of ICDLEADSTAT2: active  (11/03/2008 16:28) Added new observation of ICDLEADSER2: [id number]  (11/03/2008 16:28) Added new observation of ICDLEADMOD2: 0157  (11/03/2008 16:28) Added new observation of ICDLEADDOI2: 03/24/2005  (11/03/2008 16:28) Added new observation of ICDLEADLOC2: RV  (11/03/2008 16:28) Added new observation of ICDLEADSTAT1: active  (11/03/2008 16:28) Added new observation of ICDLEADSER1: [id number]  (11/03/2008 16:28) Added new observation of ICDLEADMOD1: [id number]  (11/03/2008 16:28) Added new observation of ICDLEADDOI1: 03/24/2005  (11/03/2008 16:28) Added new observation of ICDLEADLOC1: RA  (11/03/2008 16:28) Added new observation of ICD IMPL DTE: 03/24/2005  (11/03/2008 16:28) Added new observation of ICD SERL#: [id number]  (11/03/2008 16:28) Added new observation of ICD MODL#: [id number]  (11/03/2008 16:28) Added new observation of ICDMANUFACTR: [employer]  (11/03/2008 16:28) Added new observation of CARDIO MD: Cristopher Peru, MD  (11/03/2008 16:28)      PPM Specifications Following MD:  Cristopher Peru, MD       ICD Specifications Following MD: Cristopher Peru, MD      ICD Vendor: [place]     ICD Model Number: H210     ICD Serial Number: [id number]  ICD DOI: 03/24/2005      Lead 1:    Location: RA     DOI: 03/24/2005     Model #: [id number]     Serial #: [id number]     Status: active Lead 2:    Location: RV     DOI: 03/24/2005     Model #: [id number]      Serial #: [id number]     Status: active Lead 3:    Location: LV     DOI: 03/24/2005     Model #: [id number]     Serial #: [id number]     Status: active  Indications::  NICM  Explantation Comments: Latitude

## 2010-10-27 NOTE — Progress Notes (Signed)
Summary: TALK TO NURSE  Phone Note Call from Patient Call back at Home Phone [phone]   Caller: Patient Summary of Call: PT WOULD LIKE TO TALK TO NURSE CONCERNING SOME LABS SHE WANTS TO HAVE DONE.  Initial call taken by: Lorraine Lax,  June 20, 2010 9:15 AM  Follow-up for Phone Call        problems with feet.  Have concerns feet not swollen just feel swollen and no numbness just "tingling"  Have run a TSH, CBC, and COMP MET and VIT B 12 FOLate.  Will discuss with Dr Lovena Le at office visit and let him order if necessary. Janan Halter, RN, BSN  June 20, 2010 9:47 AM

## 2010-10-27 NOTE — Cardiovascular Report (Signed)
Summary: Office Visit Remote  Office Visit Remote   Imported By: Sallee Provencal 03/30/2009 15:20:49  _____________________________________________________________________  External Attachment:    Type:   Image     Comment:   External Document

## 2010-11-02 NOTE — Miscellaneous (Signed)
Summary: Device change out  Clinical Lists Changes  Observations: Added new observation of ICD IMP MD: Cristopher Peru, MD (10/25/2010 19:48) Added new observation of ICD IMPL DTE: 10/25/2010 (10/25/2010 19:48) Added new observation of ICD SERL#: [id number]  (10/25/2010 19:48) Added new observation of ICD MODL#: N119  (10/25/2010 19:48) Added new observation of ICDEXPLCOMM: 10/25/10 [place] H210/201780 explanted  (10/25/2010 19:48)       ICD Specifications Following MD:  Cristopher Peru, MD     ICD Vendor:  [place] Number:  [id number]     ICD Serial Number:  [id number] ICD DOI:  10/25/2010     ICD Implanting MD:  Cristopher Peru, MD  Lead 1:    Location: RA     DOI: 03/24/2005     Model #: [id number]     Serial #: [id number]     Status: active Lead 2:    Location: RV     DOI: 03/24/2005     Model #: [id number]     Serial #: [id number]     Status: active Lead 3:    Location: LV     DOI: 03/24/2005     Model #: [id number]     Serial #: [id number]     Status: active  Indications::  NICM  Explantation Comments: 10/25/10 [employer] H210/201780 explanted  Episodes Coumadin:  Yes  Brady Parameters Mode DDD     Lower Rate Limit:  40     Upper Rate Limit 120 PAV 120      Tachy Zones VF:  250     VT:  200     VT1:  175

## 2010-11-03 NOTE — Op Note (Signed)
  NAMEVENICIA, [name] NO.:  [id number]  MEDICAL RECORD NO.:  [id number]          PATIENT TYPE:  OIB  LOCATION:  2899                         FACILITY:  [facility]  PHYSICIAN:  Champ Mungo. Lovena Le, MD    DATE OF BIRTH:  [birth year]  DATE OF PROCEDURE:  10/25/2010 DATE OF DISCHARGE:  10/25/2010                              OPERATIVE REPORT   PROCEDURE PERFORMED:  Implantable cardioverter-defibrillator removal and insertion of a new implantable cardioverter-defibrillator with defibrillation threshold testing.  INTRODUCTION:  The patient is a 55 year old woman with a nonischemic cardiomyopathy and severe LV dysfunction, class III heart failure who underwent BiV ICD insertion approximately 5-1/2 years ago.  Her heart failure improved from class III to class II, and her EF improved as well.  She has reached elective replacement indication on her device, and she is now referred for insertion of a new device.  PROCEDURE IN DETAIL:  After informed consent was obtained, the patient was taken to the diagnostic EP lab in a fasting state.  After usual preparation and draping, intravenous fentanyl and midazolam were given for sedation.  A 30 mL of lidocaine was infiltrated into the left infraclavicular region.  A 7-cm incision was carried out over this region.  Electrocautery was utilized to dissect down to the ICD pocket. The pocket was entered with electrocautery and under gentle traction, the device was removed in total.  The leads were evaluated and found to be working satisfactorily.  P-waves were 6, R-waves were 16, the impedance was 470 in the atrium, 560 in the RV, and 530 in the LV. Thresholds were less than 1.5 volts at 0.4 milliseconds in both RA, RV, and LV.  With these satisfactory parameters, the pocket was irrigated with gentamicin irrigation.  The new [place] ICD, serial number [id number] was connected to the atrial RV and LV leads and placed back  in the subcutaneous pocket.  The pocket was irrigated with additional gentamicin irrigation, and the patient was more deeply sedated for defibrillation threshold testing.  After the patient was deeply sedated with fentanyl and Versed, VF was induced with a T-wave shock.  A 23-joule shock was delivered, which terminated VF and restored sinus rhythm.  No additional defibrillation threshold testing was carried out, and the incision was closed with 2-0 and 3-0 Vicryl.  Benzoin and Steri-Strips were painted on the skin.  A pressure dressing was applied and the patient was returned to her room in satisfactory condition.  COMPLICATIONS:  There were no immediate procedure complications.  RESULTS:  This demonstrated successful removal of previously implanted BiV ICD, which had reached elective replacement indication, insertion of a new BiV ICD with defibrillation threshold testing.    Champ Mungo. Lovena Le, MD    GWT/MEDQ  D:  10/25/2010  T:  10/25/2010  Job:  [id number]  Electronically Signed by Cristopher Peru MD on 11/03/2010 05:18:17 PM

## 2010-11-10 NOTE — Cardiovascular Report (Signed)
Summary: Pre-Op Orders  Pre-Op Orders   Imported By: Marilynne Drivers 11/03/2010 17:27:03  _____________________________________________________________________  External Attachment:    Type:   Image     Comment:   External Document

## 2010-11-15 ENCOUNTER — Telehealth: Payer: Self-pay | Admitting: Internal Medicine

## 2010-11-17 ENCOUNTER — Ambulatory Visit (INDEPENDENT_AMBULATORY_CARE_PROVIDER_SITE_OTHER): Payer: Medicare Other

## 2010-11-17 ENCOUNTER — Encounter: Payer: Self-pay | Admitting: Internal Medicine

## 2010-11-17 DIAGNOSIS — I428 Other cardiomyopathies: Secondary | ICD-10-CM

## 2010-11-22 NOTE — Progress Notes (Signed)
Summary: question re surgery  Phone Note Call from Patient Call back at Home Phone [phone]   Caller: Patient Reason for Call: Talk to Nurse Summary of Call: pt has question re outpatient surgery she had done. Initial call taken by: Regan Lemming,  November 15, 2010 8:52 AM  Follow-up for Phone Call        pt was never sch a post hosp wound check after her generator change.  Sch her for 11/17/10 at 11:00am Janan Halter, RN, BSN  November 15, 2010 12:59 PM

## 2010-12-01 NOTE — Procedures (Signed)
Summary: gen change on 10/25/10 [facility] Bi-VICD-[place]   Current Medications (verified): 1)  Aldactone 25 Mg Tabs (Spironolactone) .Marland Kitchen.. 1 By Mouth Once Daily 2)  Coreg 25 Mg Tabs (Carvedilol) .... Take 1 Tablet By Mouth Twice A Day 3)  Bidil 20-37.5 Mg Tabs (Isosorb Dinitrate-Hydralazine) .Marland Kitchen.. 1 By Mouth Two Times A Day 4)  Benicar 20 Mg Tabs (Olmesartan Medoxomil) .... 1/2 By Mouth Once Daily 5)  Furosemide 40 Mg Tabs (Furosemide) .... Take One Tablet By Mouth Every Other Day 6)  Crestor 10 Mg Tabs (Rosuvastatin Calcium) .... Take One Tablet By Mouth Daily. 7)  Coumadin 5 Mg Tabs (Warfarin Sodium) .... As Directed  Allergies (verified): No Known Drug Allergies    ICD Specifications Following MD:  Cristopher Peru, MD     ICD Vendor:  [place] Number:  [phone]     ICD Serial Number:  [id number] ICD DOI:  10/25/2010     ICD Implanting MD:  Cristopher Peru, MD  Lead 1:    Location: RA     DOI: 03/24/2005     Model #: [id number]     Serial #: [id number]     Status: active Lead 2:    Location: RV     DOI: 03/24/2005     Model #: [id number]     Serial #: [id number]     Status: active Lead 3:    Location: LV     DOI: 03/24/2005     Model #: [id number]     Serial #: [id number]     Status: active  Indications::  NICM  Explantation Comments: 10/25/10 [employer] H210/201780 explanted  Episodes Coumadin:  Yes  Brady Parameters Mode DDD     Lower Rate Limit:  40     Upper Rate Limit 120 PAV 120      Tachy Zones VF:  250     VT:  200     VT1:  175     Tech Comments:  See PaceArt Prescriptions: COREG 25 MG TABS (CARVEDILOL) Take 1 tablet by mouth twice a day  #60 x 5   Entered by:   Margaretmary Bayley CMA   Authorized by:   Sophronia Simas, MD, [facility]   Signed by:   Margaretmary Bayley CMA on 11/17/2010   Method used:   Electronically to        [facility] (retail)       [phone]       [place], [place]  [address]       Ph: [phone]       Fax: [phone]   RxID:    [id number]

## 2010-12-06 NOTE — Cardiovascular Report (Signed)
Summary: Office Visit   Office Visit   Imported By: Sallee Provencal 12/02/2010 15:04:58  _____________________________________________________________________  External Attachment:    Type:   Image     Comment:   External Document

## 2010-12-27 ENCOUNTER — Other Ambulatory Visit: Payer: Self-pay | Admitting: *Deleted

## 2010-12-27 DIAGNOSIS — I10 Essential (primary) hypertension: Secondary | ICD-10-CM

## 2010-12-27 MED ORDER — CARVEDILOL 25 MG PO TABS: 25.0000 mg | ORAL_TABLET | Freq: Two times a day (BID) | ORAL | Status: DC

## 2011-02-07 NOTE — Assessment & Plan Note (Signed)
[facility] OFFICE NOTE   [name], [name]                      MRN:          [id number]  DATE:04/12/2007                            DOB:          [birth year]    [name] returns today for followup.  She is a very pleasant middle-  aged woman with a nonischemic cardiomyopathy, left bundle branch block,  and fairly severe congestive heart failure, who underwent BiV ICD  implantation back in June, 2006.  She returns today for followup.  Overall, her heart failure symptoms are much improved.  She has gone  from class IIIB to class IIA at worst.  She otherwise had no specific  complaints today.  She denies peripheral edema.  She is walking between  30-45 minutes every day.   Her medications include Coumadin, multivitamin, Crestor, BiDil,  Aldactone, Benicar, Lasix, Coreg 25 mg 1 tablet in the morning and a  half tablet in the evening.   PHYSICAL EXAMINATION:  GENERAL:  She is a pleasant, well-appearing,  middle-aged woman in no acute distress.  VITAL SIGNS:  Blood pressure is 102/58.  The pulse was 75 and regular.  The respirations were 18.  The weight was 124 pounds.  NECK:  No jugular venous distention.  LUNGS:  Clear bilaterally to auscultation.  There are no wheezes, rales  or rhonchi present.  CARDIOVASCULAR:  Regular rate and rhythm with a normal S1 and S2.  There  is a soft systolic murmur at the left lower sternal border.  ABDOMEN:  Soft and nontender.  EXTREMITIES:  No clubbing, cyanosis or edema.   Interrogation of her defibrillator demonstrates a Guidant H-210 BiV  device with P waves of 7, R waves of 17, and impedance of 500 in the  atrium, 614 in the right ventricle, and 512 in the left ventricle with a  threshold of 1 volt at 0.4 in the atrium and 0.8 at 0.4 in the right  ventricle and 1 volt of 0.4 in the left ventricle.  The battery voltage  was 3.01 volts.  The underlying rhythm was  sinus.  There were no  intercurrent ICD therapies.   The EKG today demonstrates a sinus rhythm with biventricular pacing.   IMPRESSION:  1. Nonischemic cardiomyopathy.  2. Congestive heart failure.  3. Status post biventricular implantable cardioverter/defibrillator      insertion with much improvement in her heart failure symptoms.   DISCUSSION:  I have asked the patient to follow back up with Dr. Concepcion Elk, who is her primary cardiologist.  She may have already undergone  2D echo, but if not, I have asked that  she asked to have this accomplished to see if we have had an incremental  improvement in her LV function, which was previously 10%.     Champ Mungo. Lovena Le, MD  Electronically Signed    GWT/MedQ  DD: 04/12/2007  DT: 04/12/2007  Job #: [id number]   cc:   Freeman Caldron. Pauline Aus, M.D.

## 2011-02-07 NOTE — Assessment & Plan Note (Signed)
[name] OFFICE NOTE   [name], [name]                      MRN:          [id number]  DATE:11/17/2008                            DOB:          1956/09/08    [name] returns today for ICD followup.  She is a very pleasant  middle-aged woman with a history of nonischemic cardiomyopathy and left  bundle-branch block and congestive heart failure, who is status post  biopsy ICD insertion in 2006.  She returns today for followup.  Her  heart failure remains class II.  She does note that she has stopped  smoking back in December.  She had no specific complaints today.   CURRENT MEDICATIONS:  1. Coumadin 5 mg a day as directed.  2. Multivitamin.  3. Crestor 10 a day.  4. BiDil 5 mg daily.  5. Benicar 20 mg daily.  6. Aldactone 2.5 mg daily.  7. Lasix 40 a day.  8. Coreg 25 twice a day.   PHYSICAL EXAMINATION:  GENERAL:  She is a pleasant middle-aged woman in  no acute distress.  VITAL SIGNS:  Blood pressure today was 109/70, the pulse 72 and regular,  respirations were 18.  Weight was not recorded.  NECK:  No jugular venous distention.  There is no thyromegaly.  LUNGS:  Clear bilaterally to auscultation.  No wheezes, rales, or  rhonchi are present with no increased work of breathing.  ABDOMEN:  Soft, nontender.  There is no organomegaly.  EXTREMITIES:  No edema.  CARDIOVASCULAR:  She had a grade 3/6 systolic murmur best heard at the  right upper sternal border.   IMPRESSION:  1. Nonischemic cardiomyopathy status post biventricular implantable      cardioverter-defibrillator insertion now with near normalization of      her left ventricular function.  2. Congestive heart failure secondary to nonischemic cardiomyopathy      status post biventricular implantable cardioverter-defibrillator      insertion now with near normalization of her left ventricular      function, now class I-II.  3. Aortic  stenosis by 2-D echo demonstrated back in August.   DISCUSSION:  Overall, the patient is stable.  Her defibrillator is  working normally.  I should note that P and R waves were 4 and 16  respectively.  The impedance was 500 and 600 and 500 in the right  atrium, right ventricle, and left ventricle respectively, and thresholds  were all less than 1 volt at 0.5 milliseconds in the right atrium, right  ventricle, and left ventricle.  Her battery voltage was 2.57, which is  MOL 2.   I have recommended a period of watchful waiting with the patient.  She  has aortic stenosis and will likely some day need valve surgery, though  she is not severe enough to recommend this at that time.  Her return to  normal LV function with biV defibrillator insertion has resulted in her  being much improved symptomatically.  I will see her back in several  months.  My expectation is that we will repeat her 2-D echo a year  after  the most recent one to see what was going on with her aortic valve.     Champ Mungo. Lovena Le, MD  Electronically Signed    GWT/MedQ  DD: 11/17/2008  DT: 11/18/2008  Job #: [id number]   cc:   Teodoro Kil. Jonny Ruiz, M.D.

## 2011-02-07 NOTE — Assessment & Plan Note (Signed)
[facility] OFFICE NOTE   [name], [name]                      MRN:          [id number]  DATE:11/13/2007                            DOB:          Apr 23, 1956    REQUESTING PHYSICIAN:  [name], M.D.   REASON FOR CONSULTATION:  Personal history of adenomatous colon polyps,  on Coumadin anticoagulation.   HISTORY OF PRESENT ILLNESS:  This is a 55 year old African-American  female who previously worked in [employer].  She was diagnosed with a nonischemic cardiomyopathy and congestive heart  failure and is status post biventricular implantable  cardioverter/defibrillator.  She has had a relatively good course since  her implantable defibrillator was placed and she is followed by Dr.  Pauline [name].  She previously underwent colonoscopy in April, 1999 which  revealed an adenomatous colon polyp.  A subsequent colonoscopy was  performed in July, 2002, which revealed a hyperplastic polyp.  She was  recommended to return for a five year recall, but due to her cardiac  problems, she has deferred until this time.  She relates that her mother  had pancreatic cancer, and her brother had a form of bone cancer.  She  does not know of any family members with colon cancer, colon polyps, or  inflammatory bowel disease.  She has no gastrointestinal complaints  whatsoever and specifically denies any abdominal pain, dyspeptic  symptoms, change in bowel habits, change in stool caliber, melena, or  hematochezia.   PAST MEDICAL HISTORY:  1. Adenomatous colon polyps, initial diagnosis in April, 1999.  2. Nonischemic cardiomyopathy.  3. Congestive heart failure, status post biventricular implantable      cardioverter/defibrillator placement.  4. Status post cholecystectomy.  5. Status post hysterectomy.  6. Status post tubal ligation.   CURRENT MEDICATIONS:  Listed on the chart, updated, and  reviewed.   MEDICATION ALLERGIES:  None known.   SOCIAL HISTORY:  Per the handwritten form.   REVIEW OF SYSTEMS:  Per the handwritten form.   PHYSICAL EXAMINATION:  Well-developed and well-nourished in no acute  distress.  Height 5 feet 3 inches, weight 128 pounds.  Blood pressure 152/72, pulse  80 and regular.  HEENT:  Anicteric sclerae.  Oropharynx clear.  CHEST:  Clear to auscultation bilaterally.  CARDIAC:  Regular rate and rhythm without murmurs appreciated.  The  implantable cardioverter/defibrillator is palpable in the left upper  chest.  ABDOMEN:  Soft, nontender, nondistended.  Normoactive bowel sounds.  No  palpable organomegaly, masses, or hernias.  RECTAL:  Deferred to the time of colonoscopy.  EXTREMITIES:  Without clubbing, cyanosis or edema.  NEUROLOGIC:  Alert and oriented x3.  Grossly nonfocal.   ASSESSMENT/PLAN:  A personal history of adenomatous colon polyps.  Coumadin anticoagulation for cardiomyopathy.  Status post implantable  cardioverter/defibrillator for cardiomyopathy.  Risks, benefits and  alternatives to colonoscopy with possible biopsy and possible  polypectomy performed while off Coumadin anticoagulation for five days  discussed with the patient, and she consents to proceed.  This will be  scheduled electively.  We will obtain clearance from Dr. Pauline [name] for a  hold  on Coumadin with plans to restart Coumadin on the day of the  procedure.  If electrocautery is needed, her implantable defibrillator  will need to be switched off temporarily.     [name] Plan, MD, [facility]  Electronically Signed    MTS/MedQ  DD: 11/13/2007  DT: 11/14/2007  Job #: [id number]   cc:   [name], M.D.

## 2011-02-07 NOTE — Assessment & Plan Note (Signed)
[place] OFFICE NOTE   ROBBEN, FANFAN                      MRN:          [id number]  DATE:04/28/2008                            DOB:          08-23-1956    Ms. Leffers returns today for followup.  She is a very pleasant middle-  aged woman with a history of nonischemic cardiomyopathy, left bundle-  branch block, and severe congestive heart failure who is status post BiV  ICD insertion back in 2006.  She returns today for followup.  Her heart  failure continues to be class II.  She has very mild dyspnea with  exertion.  She had no other complaints today.   MEDICATIONS:  1. Coumadin daily as directed.  2. Crestor 10 a day.  3. BiDil daily as directed.  4. Aldactone 25 a day.  5. Benicar 20 a day.  6. Coreg 25 twice a day.  7. Lasix 40 mg every other day.   PHYSICAL EXAMINATION:  GENERAL:  She is a pleasant, middle-aged woman in  no acute distress.  VITAL SIGNS:  Blood pressure was 122/68, the pulse was 80 and regular,  the respirations were 18, and weight was 125 pounds.  NECK:  No jugular venous distention.  LUNGS:  Clear bilaterally to auscultation.  CAROTIDS:  Had a very loud murmurs bilaterally versus bruits, I suspect  the former.  CARDIOVASCULAR:  Regular rate and rhythm with normal S1 and a grade 3/6  systolic murmur at the right upper sternal border (this is louder than I  remember).  PMI is enlarged and laterally displaced.  ABDOMEN:  Soft and nontender.  EXTREMITIES:  Demonstrated no edema.   Interrogation of her defibrillator demonstrates a Guidant contact H-210.  The P-waves are 5, the R-waves are 14, the impedance is 495 in the A,  614 in the V,  512 in the LV.  Threshold volt at 0.5 in the A, 8.4 in  the RV, and 1.4 in the LV.  Battery voltage was 2.64 volts.  There were  no intercurrent IC therapies.   IMPRESSION:  1. Nonischemic cardiomyopathy.  2. Congestive heart  failure.  3. Status post biventricular pacemaker-implantable cardioverter      defibrillator insertion.   DISCUSSION:  Overall, Ms. Broman is stable and her defibrillator is  working normally.  We will plan to see the patient back in the office in  several months.  Because of her worsening murmur, I will plan on  obtaining a 2-D echo to evaluate aortic stenosis and mitral  regurgitation.     Champ Mungo. Lovena Le, MD  Electronically Signed    GWT/MedQ  DD: 04/28/2008  DT: 04/29/2008  Job #: [id number]

## 2011-02-10 NOTE — Consult Note (Signed)
NAMECAPRICIA, [name]                         ACCOUNT NO.:  [id number]   MEDICAL RECORD NO.:  [id number]                   PATIENT TYPE:  INP   LOCATION:  0160                                 FACILITY:  [facility]   PHYSICIAN:  Freeman Caldron. Pauline Aus, M.D.                DATE OF BIRTH:  August 04, 1956   DATE OF CONSULTATION:  06/20/2002  DATE OF DISCHARGE:                                   CONSULTATION   HISTORY OF PRESENT ILLNESS:  It is a privilege to be asked to participate in  the care of this very nice 55 year old patient, a patient of Dr. Sherrian Divers, by  providing consultative services at his request, in regard to her congestive  heart failure.   The patient has had known bicuspid aortic valve since the 1970s.  This has  been treated with sustained release Nifedipine, in accordance with the  studies published in the early 1990s.  I had seen her sporadically for 20  years, but have not been directly involved in her care in the last three or  four years since she has been seeing another physician.  Her primary care  physician is Dr. Hosie Poisson.   She relates a history of three to four weeks of suspected bronchitis.  She  says that she did not respond very well to Z-pack, and was on prednisone for  a number of days.  She became gradually more short of breath, and for  approximately eight days prior to admission, could not lie down flat to  sleep.  She says that she did not notice that her ankles were swelling.  Finally, on the day of admission she became so short of breath that she  could not tolerate it anymore, and presented for evaluation.  It is of note  that she ran out of her sustained release nifedipine about one week prior to  this admission.   PAST MEDICAL HISTORY:  She has had a total abdominal hysterectomy in about  1999 and had a ruptured appendix requiring a month of hospitalization in the  late 1970s.   FAMILY HISTORY:  Her mother died of cancer of the pancreas.  Her father and  a brother both have high blood pressure.   SOCIAL HISTORY:  She is separated from her husband who is currently on  dialysis.  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  She smokes  one pack per day and has done so for many years (in spite of extensive  admonishing to the contrary).   CURRENT MEDICATIONS:  1. Adalat 60 mg daily when she has it.  2. Hydrochlorothiazide 25 mg daily.  3. Multivitamins.  4. Hormone patch.  5. Aspirin one daily.   REVIEW OF SYSTEMS:  CONSTITUTIONAL:  She denies fever or chills with her  recent bronchitis.  She does not think that she gained much weight while  taking the prednisone.  Edema as noted  above.  EYES:  No diplopia or  blurring.  She does wear reading glasses.  ENT:  No deafness or dizziness.  She has full dentures.  CARDIOVASCULAR:  See history of present illness.  RESPIRATORY:  She has had a good bit of cough with the recent problems.  Her  smoking history is as noted above.  GASTROINTESTINAL:  She has been  constipated and uses Metamucil p.r.n.  GU:  No dysuria or pyuria.  MUSCULOSKELETAL:  No  swelling joint or myalgias.  SKIN AND BREAST:  No rash  or nodule.  NEUROLOGIC:  No faintness or syncope.  PSYCHIATRIC:  No  depression or hallucinations.  ENDOCRINE:  No diabetes or thyroid disease.  She is postmenopausal and uses  a hormone replacement place following her  hysterectomy.  HEMATOLOGY AND LYMPH:  No swelling in neck, axilla or groin.  ALLERGIC/LYMPHATIC:  She has no known drug allergy.  All the remaining  systems in her comprehensive 14-system review are negative.   PHYSICAL EXAMINATION:  GENERAL APPEARANCE:  She is a well-developed, well-  nourished, thin middle-age woman now in no acute distress.  She is oriented  to person, place and time.  Her mood and affect are appropriate.  VITAL SIGNS:  Blood pressure 140/90, pulse 100 and regular, respiratory rate  20 to 28 and slightly labored.  Temperature afebrile.  HEENT:   Conjunctivae and lids reveal no xanthelasma, icterus or senilis.  She has full dentures.  The oral mucosa reveals no pallor or cyanosis.  NECK:  Supple and symmetric.  Trachea is midline and mobile.  There is no  palpable thyromegaly or cervical node.  No carotid bruit or apparent jugular  venous distension.  LUNGS:  Her respiratory effort is mildly decreased.  Her diaphragmatic  excursion is normal.  Her lungs now reveal very few rales and no wheezes.  At the initial history and physical, reports considerable rales.  BACK:  Straight and nontender.  Her gait is not tested. She could undergo  stress test, however.  Muscle strength and tone are normal.  CARDIOVASCULAR:  The cardiac apical impulse is somewhat prolonged and there  is a palpable third sound at the apex, which lies in the fifth intercostal  space just at the left anterior axillary line.  The heart rhythm is regular  and rate is 100 beats per minute.  There is a grade 3/6 systolic murmur at  the right upper sternal border. There is a grade 3/6 apical systolic murmur  with faint radiations at the axilla.  There is a third sound or a short  diastolic murmur at the apex as well.  ABDOMEN:  Flat and nontender. There is no definite enlargement of liver or  spleen.  The abdominal aorta is palpable and there is no bruit.  The femoral  arteries are without bruits.  EXTREMITIES:  The digits and nails reveal no clubbing or cyanosis.  The  pedal pulses are intact.  There is 2+ posterior tibial and dorsal pedis  pulses bilaterally.  Her legs reveal no edema.  SKIN AND SUBCUTANEOUS TISSUE:  No stasis dermatitis or ulcer.   Her admitting electrocardiogram shows sinus rhythm, left axis deviation,  left bundle branch block, and biatrial enlargement.   The patient has congestive heart failure, in the setting of a known bicuspid  aortic valve, with her hemodynamic situation complicated by a long period of bronchitis, multiple medication  therapy, including a considerable dose of  prednisone, and her untimely lack of her usual  sustained release nifedipine.  Her screening tests for ischemia are negative, and that is not surprising.   At this point, she will clearly need a cardiac catheterization because she  now has symptomatic disease related to her valvular status.  Whether her  current deterioration is simply a result of multiple concurrent issues noted  above, or whether her aortic insufficiency has increased, or whether with  the other problems and left ventricular dilatation, her mitral regurgitation  has gotten worse, would be difficult to sort out without additional testing.  I do not think she really needs to remain in the hospital over the weekend  for cardiac catheterization on Monday if her hemodynamic situation continues  to improve.  She did have a period of tachycardia this morning, which  resolved, and I have given her additional diuretics.  When she is stable, if  that occurs before the weekend is over, it would not be unreasonable to  discharge her and schedule her for cardiac catheterization next week.  I  will be glad to arrange this as an outpatient catheterization with Dr.  Glade Lloyd next week if you wish or if you desire to arrange that per your own  group's cardiologist, that also would be quite reasonable.   To summarize, it would be reasonable to think that her current episode of  congestive heart failure is due to her multiplicity of elements, indicated  above, all occurring in context of chronic bicuspid aortic valve.  Since it  took her several weeks to get into congestive heart failure, I think we can  assume that her left ventricular function is not so poor as to obviate the  reasonableness of evaluation in the outpatient setting when she is stable.  She should be on her Adalat and she should be on a loop diuretic and a  minimum of 40 mg of Lasix a day.  Further evaluation will need to follow  the  cardiac catheterization.  It may well be time to replace one or another  valve.  Incidentally, I did not mention above that we cannot discount the  possibility that she has ruptured some chordae from mitral valve, and in  that case would need mitral valve repair as well.   I appreciate the opportunity to see her.  [facility] will  be covering for me over the weekend and they will be available if she needs  further follow-up.  I presume that she will not need any more specific  cardiology follow-up; however, before her cardiac catheterization.                                                Freeman Caldron. Pauline Aus, M.D.    DDG/MEDQ  D:  06/20/2002  T:  06/20/2002  Job:  [id number]

## 2011-02-10 NOTE — Consult Note (Signed)
[name], HYMAN NO.:  [id number]   MEDICAL RECORD NO.:  [id number]          PATIENT TYPE:  INP   LOCATION:  0162                         FACILITY:  [name]   PHYSICIAN:  Kathrin Penner, M.D.   DATE OF BIRTH:  [birth year]   DATE OF CONSULTATION:  09/05/2004  DATE OF DISCHARGE:                                   CONSULTATION   REFERRING PHYSICIAN:  Dr. Lucio Edward and Ms. Amy __________ PA-C.   PROBLEMS:  1.  Acute cholecystitis.  2.  Dilated cardiomyopathy on anticoagulation with left ventricular ejection      fraction of less than 25%.  3.  History of hypertension.  4.  History of coronary artery disease, status post catheterization showing      noncritical coronary artery stenoses.   CURRENT MEDICATIONS:  1.  Coumadin 2 mg daily.  2.  Lisinopril 10 mg daily.  3.  Zoloft 50 mg daily.  4.  Lasix 40 mg daily.  5.  Zocor 40 mg daily.  6.  Toprol XL 50 mg b.i.d.   ALLERGIES:  No known allergies.   PAST MEDICAL/SURGICAL HISTORY:  1.  Depression.  2.  Status post appendectomy.  3.  Status post abdominal hysterectomy.   HISTORY:  [facility] patient is a 55 year old African-American female with a 3 week  history of worsening epigastric and right upper quadrant abdominal pain  associated with nausea and vomiting.  Prior to admission, she had a  temperature up to 103.  She was [facility] at [name]  where an abdominal ultrasound was done and showed a thick-walled  gallbladder, measuring some 13 mm in thickness.  There were no stone seen.  There was no evidence of ductal dilatation.  She had a white blood cell  count of 4.8 with a hemoglobin of 14.7 and a platelet count of 218,000.  Amylase was normal.  Total bilirubin is mildly elevated to 1.4.  SGOT and  SGPT were mildly elevated, and coags showed an INR of 1.8.   SOCIAL HISTORY:  [facility] patient is a 55 year old African-American female.  She  lives alone.  Her closest relative is a son, who is  82 years old and lives  in [name].  She drinks alcohol occasionally.  She smokes 1 pack of  cigarettes a day, although she says she quit approximately 15 days prior to  admission.   REVIEW OF SYSTEMS:  Negative in detail except as outlined above.   PHYSICAL EXAMINATION:  VITAL SIGNS:  [facility] patient is afebrile with a  temperature of 97.  Pulse is 76, respirations 20, and her blood pressure is  normal at 130/84.  HEENT:  Sclerae anicteric.  She does have a positive jugular venous  distension.  There is no thyromegaly and no palpable cervical nodes.  LUNGS:  Clear to auscultation bilaterally.  HEART:  Regular rate and rhythm.  There is a 2/6 systolic murmur.  ABDOMEN:  Tender in [facility] right upper quadrant without rebound.  There are no  masses or visceromegaly palpated.  EXTREMITIES:  There is no cyanosis, clubbing, or edema.  NEUROLOGIC EXAMINATION:  [facility] patient is fully alert and oriented.  She moves  all four extremities well and shows of evidence of sensory deficits.   ASSESSMENT:  1.  Acute acalculous cholecystitis.  2.  Dilated cardiomyopathy with congestive heart failure.  3.  Bicuspid aortic valve.  No significant gradient noted in 2003.  4.  Currently on Coumadin anticoagulation with an INR of 1.8.   PLAN:  We will reverse anticoagulation as necessary.  We are giving her  vitamin K and fresh frozen plasma.  I agree with her being on Unasyn as her  initial antibiotic.  I will request cardiologic evaluation from Dr. Concepcion Elk, who is her cardiologist.  I will ask [facility] critical care management  team to take a look at her, as I anticipate that because of her  cardiomyopathy, she may require much closer fluid monitoring during surgery.  Following this, she will be taken to [facility] operating room for laparoscopic  cholecystectomy.  [facility] risks and potential benefits of surgery have been  discussed with her, including her cardiac risks, and [facility] patient understands  and gives  consent.     Patr   PB/MEDQ  D:  09/06/2004  T:  09/06/2004  Job:  [id number]   cc:   Pricilla [name]. Fuller Plan, M.D. [facility]

## 2011-02-10 NOTE — H&P (Signed)
[name], [name] NO.:  [id number]   MEDICAL RECORD NO.:  [id number]          PATIENT TYPE:  INP   LOCATION:  [place]                         FACILITY:  [facility]   PHYSICIAN:  Malcolm T. Fuller Plan, M.D. [facility] OF BIRTH:  21-Oct-1955   DATE OF ADMISSION:  09/05/2004  DATE OF DISCHARGE:                                HISTORY & PHYSICAL   CHIEF COMPLAINT:  Epigastric pain, nausea, vomiting, and fever.   HISTORY:  Oshun is a pleasant 55 year old African-American female, employee  of [employer], who has history of hypertension, congestive heart failure,  cardiomyopathy with ejection fraction 25 to 35% documented in 2003. She has  had a bicuspid aortic valve diagnosed in the 70s. She also has left bundle  branch block. The patient was admitted last in September of 2003 with an  episode of congestive heart failure. She did undergo cardiac catheterization  at that time and was found to have noncritical coronary disease. She now  presents with a 2 to 3 week history of intermittent epigastric pain which  has progressed. This has been associated with nausea and vomiting and some  radiation of discomfort into her back. Her appetite has been okay. She has  had some intermittent mild diarrhea. No melena or hematochezia. Apparently,  she has been having fevers off and on as well to 101 at home and had  temperature last night of 103. Her pain has been worse over the past few  days associated with some dyspnea. She says that she thought her pain may  have been cardiac and thus went to see Dr. Pauline Aus on Friday, September 02, 2004. She did not have any changes in her regimen at that time. She came  back to work today ill with nausea and vomiting and was seen by Dr. Fuller Plan.  Abdominal ultrasound today showed a markedly abnormal gallbladder with walls  13 to 15 mm. No stones seen. Common bile duct normal at 2.4 mm, liver with  some increased portal markings. Labs showed a WBC of 4.8,  hemoglobin 14.7,  hematocrit of 45.9, potassium 4.7, BUN 71, creatinine 1.3, amylase 104;  total bilirubin 1.4, alkaline phosphatase 120, SGOT 141, and SGPT of 127.  She is admitted at this time with acute cholecystitis for pain control,  antiemetics, IV antibiotics, and surgical consultation.   CURRENT MEDICATIONS:  1.  Coumadin 2 mg q.d.  2.  Lasix 40 q.d.  3.  Zoloft 50 q.d.  4.  Zocor 40 q.d.  5.  Lisinopril 10 p.o. q.d.   ALLERGIES:  No known drug allergies.   PAST HISTORY:  As outlined above. She is also status post total abdominal  hysterectomy, bicuspid aortic valve, and is status post appendectomy with a  ruptured appendix in the 15s.   FAMILY HISTORY:  Mother deceased with pancreatic CA. She has a maternal aunt  with colon CA. One brother with bone CA.   SOCIAL HISTORY:  The patient is employed as a Research scientist (physical sciences) at [employer]. She  is a smoker but has not had any cigarettes over the past 15 days. Occasional  ETOH.  REVIEW OF SYSTEMS:  Currently denies any chest pain. PULMONARY:  Pertinent  for some recent increase in dyspnea with movement and/or pain.  GENITOURINARY:  Negative. GASTROINTESTINAL:  As above.   PHYSICAL EXAMINATION:  GENERAL:  Well-developed, African-American female in  no acute distress. She is ill appearing. Temperature is 97.7, blood pressure  130/84, pulse is 76.  HEENT:  Normocephalic, atraumatic. EOMI. PERRLA. Sclerae anicteric. She does  have some jugular venous distention to the jawline.  CARDIOVASCULAR:  Regular rate and rhythm with S1 and S2. She has systolic  murmur.  PULMONARY:  Clear.  ABDOMEN:  Soft. Bowel sounds are active. She is tender in the right upper  quadrant. There is no definite hepatomegaly. No masses.  RECTAL:  Not done today.  EXTREMITIES:  Without edema. Pulses were intact.   LABORATORY DATA:  Remaining labs are pending.   IMPRESSION:  61.  A 55 year old African-American female with acute cholecystitis with      nausea,  vomiting, fever, and abnormal gallbladder on ultrasound.  2.  Mildly elevated liver function tests.  3.  Congestive heart failure.  4.  Bicuspid aortic valve.  5.  Cardiomyopathy with ejection fraction 25 to 35% documented in 2003.  6.  Chronically anticoagulated secondary to bicuspid valve.  7.  Hypertension.  8.  Hyperlipidemia.  9.  Smoker.   PLAN:  The patient is admitted. Will check coags. Obtain surgical  consultation. Hold her Coumadin. We may need to reverse this. May cardiac  clearance per Dr. Pauline Aus. She will be covered with IV Unasyn. For details,  please see the orders.      AE/MEDQ  D:  09/05/2004  T:  09/05/2004  Job:  [id number]

## 2011-02-10 NOTE — Cardiovascular Report (Signed)
[name], [name] NO.:  [id number]   MEDICAL RECORD NO.:  [id number]                   PATIENT TYPE:  INP   LOCATION:  [id number]                                 FACILITY:  [facility]   PHYSICIAN:  Lorretta Harp, M.D.             DATE OF BIRTH:  1956-03-17   DATE OF PROCEDURE:  06/23/2002  DATE OF DISCHARGE:                              CARDIAC CATHETERIZATION   INDICATIONS FOR PROCEDURE:  The patient is a 55 year old African-American  female with congenital bi____aortic stenosis.  She was admitted September 25  with congestive heart failure.  She presents now for right and left heart  catheterization.   DESCRIPTION OF PROCEDURE:  The patient was brought to the second floor [facility] in the postabsorptive state.  She was  premedicated with p.o. Valium and IV Versed.  Her right groin was prepped  and shaved in the usual sterile fashion.  Xylocaine, 1%, was used for local  anesthesia.  A #7 French sheath was inserted into the right femoral artery  using the standard Seldinger technique.  A #8 French sheath was inserted  into the right femoral vein.  A #7 French balloon-tip thermodilution Swan-  Ganz catheter was then advanced to the right heart chambers obtaining  sequential pressures, Fick and thermodilution cardiac output.  The #6 French  right and left Judkins diagnostic catheters along with a #6 French pigtail  catheter were used for selective coronary angiography, left  ventriculography, and abdominal aortography.  Omnipaque dye was used for the  entirety of the case.  Retrograde aortic, left ventricular, and pullback  pressures were recorded.   HEMODYNAMICS:  1. Aortic systolic pressure [id number], diastolic pressure 76.  2. Left ventricular systolic pressure [id number], end diastolic pressure 19.  3. RA pressure:  A wave 6, V wave 3, mean 2.  4. Right ventricular pressure:  Systolic 40, diastolic pressure 2.  5. PA pressure:   Systolic 39, diastolic 19, mean 31.  6. Pulmonary capillary wedge pressure:  A wave 26, V wave 27, mean 23.  7. Aortic valve gradient was 7 mmHg with a aortic valve area calculated by     the continuity equation at 1.34 cm sq.  8. Cardiac output by Fick was 2.7 liters/minute with an index of 1.75     liters/minute per m sq.  9. The cardiac output by thermodilution was 2.6 liters/minute with an index     of 1.69 liters/minute per m sq.   SELECTIVE CORONARY ANGIOGRAPHY:  1. Left main:  Normal.  2. LAD:  The LAD had segmental 50% stenosis in the proximal third between     the first and second diagonal branches.  3. Left circumflex:  This is a large vessel but was nondominant.  It is free     of significant disease.  4. Right coronary artery:  This is a large dominant vessel with a 95%  stenosis in a small distal PLA branch.  The acute marginal branch which     is a bifurcating vessel had a 95% proximal stenosis.  5. Left ventriculography:  RAO left ventriculogram was performed using 25 cc     of Omnipaque dye at 12 cc/second.  LVEF was estimated at approximately     30% with global hypokinesia.  6. Distal abdominal aortography:  A distal abdominal aortogram was performed     using 20 cc of Omnipaque dye at 20 cc/second.  The renal arteries were     widely patent.  The infrarenal abdominal aorta and the iliac bifurcation     appeared free of significant atherosclerotic changes.   IMPRESSION:  The patient essentially has noncritical coronary artery disease  with moderate left ventricular dysfunction out of proportion suggesting a  nonischemic cardiomyopathy.  Her aortic stenosis is trivial with no  significant gradient.  Medical therapy will be activated.   The sheaths were removed and pressure was held on the groin to achieve  hemostasis.  The patient left the lab in stable condition.  She will be  transferred back to [facility] where continued medical therapy  will be  recommended.  Dr. Concepcion Elk was notified of these results.                                               Lorretta Harp, M.D.    JJB/MEDQ  D:  06/23/2002  T:  06/25/2002  Job:  [id number]   cc:   Jerelene Redden, MD  [address] [place]  [place] [address]  Fax: [phone]   Freeman Caldron. Pauline Aus, M.D.  [address] [place]  [place] [address]  Fax: [phone]

## 2011-02-10 NOTE — Op Note (Signed)
[name], [name] NO.:  [id number]   MEDICAL RECORD NO.:  [id number]          PATIENT TYPE:  INP   LOCATION:  0499                         FACILITY:  [facility]   PHYSICIAN:  Kathrin Penner, M.D.   DATE OF BIRTH:  28-Oct-1955   DATE OF PROCEDURE:  09/06/2004  DATE OF DISCHARGE:                                 OPERATIVE REPORT   PREOPERATIVE DIAGNOSES:  Acute cholecystitis.   POSTOPERATIVE DIAGNOSES:  Acute cholecystitis.   PROCEDURE:  Laparoscopic cholecystectomy with intraoperative cholangiogram.   SURGEON:  Kathrin Penner, M.D.   ASSISTANT:  Orson Ape. Rise Patience, M.D.   ANESTHESIA:  General.   The patient is a 55 year old female with comorbidities of dilated  cardiomyopathy, hypertension and coronary artery disease admitted acutely  for three week history of rising temperature and severe abdominal pain.  A  gallbladder ultrasound showing a thick walled acutely inflamed gallbladder  with pericholecystic fluid and without any dilated ducts.  The patient comes  to the operating room now after the risks and potential benefits of surgery  have been fully discussed and all questions answered. Her anticoagulation  has been reversed for the purposes of surgery.   DESCRIPTION OF PROCEDURE:  Following the induction, of satisfactory general  anesthesia, the patient is positioned supinely, the abdomen is prepped and  draped routinely, open laparoscopy is created at the umbilicus with  insufflation of the peritoneal cavity to 14 mmHg pressure through a Hasson  cannula. The camera is inserted, the liver is somewhat sugar coated, the  gallbladder is acutely inflamed. There are multiple adhesions that around  the ampulla of the gallbladder. There are also several adhesions over the  dome of the liver and in the pelvis from previous surgery.  The anterior  gastric wall and duodenum appeared to be normal. Epigastric and lateral  ports are placed. The gallbladder is grasped  and retracted cephalad and  adhesions of the gallbladder taken down to expose the ampulla. Dissection  carried down in the region of the ampulla near the hepatoduodenal ligament  where the cystic artery and cystic duct were isolated. The cystic artery was  clipped proximally and opened and the cystic duct cholangiogram was carried  out by passing a Cook catheter through the abdominal wall and placing it in  the cystic duct and injecting through it 1/2 strength Hypaque. The resulting  cholangiogram showed prompt flow of contrast into the duodenum, no filling  defects, no ductal diltation noted.  The cholangiocatheter was removed and  the cystic duct was triply clipped and then transected. The cystic artery  was doubly clipped and transected and the gallbladder was dissected free  from the liver bed using electrocautery and maintaining hemostasis  throughout the course of the dissection.  At the end of the dissection, the  liver bed was again checked for hemostasis and noted to be dry. The  gallbladder was placed in the endoscopic pouch and retrieved through the  umbilical wound without difficulty. The right upper quadrant was then  thoroughly irrigated with multiple aliquots of saline and sucked dry.  All  areas of dissection  checked for hemostasis and noted to be dry. Sponge,  instrument and sharp counts were verified. The wound was then closed after  the trocars were removed under direct vision, the umbilical wound in two  layers with  #0 Vicryl and 4-0 Monocryl, epigastric and lateral flank wounds closed 4-0  Monocryl. All wounds are reinforced with Steri-Strips and sterile dressings  are applied. The anesthetic is reversed, patient removed from the operating  room to the recovery room in stable condition. She tolerated the procedure  well.     Patr   PB/MEDQ  D:  09/06/2004  T:  09/06/2004  Job:  [id number]   cc:   Pricilla Riffle. Fuller Plan, M.D. [facility]

## 2011-02-10 NOTE — Consult Note (Signed)
[name], [name] NO.:  [id number]   MEDICAL RECORD NO.:  [id number]          PATIENT TYPE:  INP   LOCATION:  [place]                         FACILITY:  [facility]   PHYSICIAN:  Christena Deem. Melvyn Novas, M.D. [name] OF BIRTH:  [birth year]   DATE OF CONSULTATION:  DATE OF DISCHARGE:                                   CONSULTATION   REQUESTING PHYSICIAN:  [name], M.D.   PREOPERATIVE DIAGNOSIS:  Preoperatively pulmonary evaluation.   HISTORY:  This is a 55 year old black female who carries a diagnosis of  nonischemic cardiomyopathy with increasing dyspnea associated with dry cough  for the last month and now being evaluated for cholecystectomy for tomorrow.  She states that she can walk perhaps a half block to a block before giving  out due to dyspnea.  She has noticed that her exercise tolerance has  declined but not her ability to lie flat and associates the dyspnea with a  raspy upper airway type of cough with no sputum production, chest pain,  fever, chills, sweats, or leg swelling.  Of note, she quit smoking 15 days  ago because she felt so bad with the coughing.   She comes in now complaining of new onset of epigastric pain, which on  workup is felt to be consistent with acute and chronic cholecystitis, and  therefore, in need of cholecystectomy.   PAST MEDICAL HISTORY:  She is status post abdominal hysterectomy and has a  bicuspid aortic valve.  She is status post appendectomy with a ruptured  appendix in 1970.   FAMILY HISTORY:  Mother died of pancreatic cancer.  She has a maternal aunt  with colon cancer.  One brother with bone cancer.   SOCIAL HISTORY:  The patient is a Research scientist (physical sciences) at [employer].  She  quit smoking 15 days ago, as noted above.   MEDICATIONS:  Coumadin, Lasix, Zoloft, Zocor, and lisinopril.   ALLERGIES:  None known.   REVIEW OF SYSTEMS:  Taken in detail, essentially negative except as outlined  above.  Note the absence of  __________.   PHYSICAL EXAMINATION:  VITAL SIGNS:  Stable vital signs.  GENERAL:  This is a pleasant ambulatory black female in no acute distress.  HEENT:  Unremarkable.  Pharynx is clear.  LUNGS:  Lung fields have a trace of crackles on inspiration.  She has trace  of pseudowheeze.  No true wheezing.  HEART:  She has a [id number] systolic murmur.  There is no wheezing on  expiration.  ABDOMEN:  Soft, benign with positive tenderness in the right upper quadrant.  No definite guarding or rebound.  No hepatomegaly.  EXTREMITIES:  Warm without calf tenderness.  No clubbing, cyanosis or edema.   Chest x-ray reveals cardiomegaly but no definite infiltrates or effusions.   Hemoglobin saturation is 100% on question FiO2.   IMPRESSION:  Progressive dyspnea over the last month associated with a dry,  raspy cough and pseudowheeze on exam.  This is classic for ACE inhibitor-  induced upper airway dysfunction, perhaps exacerbated by occult reflux.  My  experience has been ACE inhibitors by  themselves do not usually cause a  problem unless they are associated with second insult such as URI,  sinusitis, reflux, or intubation.  Since this patient is to undergo  intubation for general anesthesia tomorrow, we need to be careful that she  does not develop laryngospasm postop, but I would use this opportunity to  take her off of ACE inhibitor to see to what extent her dyspnea improves.  A  BNP will also need to be checked serially as well as perhaps perioperative  management with either CVP or Swan-Ganz to optimize her fluid status;  however, I see no pulmonary indications to postpone surgery with excellent  cough effectiveness and no significant excess sputum production.      MBW/MEDQ  D:  09/05/2004  T:  09/05/2004  Job:  [id number]

## 2011-02-10 NOTE — Discharge Summary (Signed)
NAMEBURNIE, Tiffany NO.:  [id number]   MEDICAL RECORD NO.:  [id number]                   PATIENT TYPE:  INP   LOCATION:  [id number]                                 FACILITY:  [facility]   PHYSICIAN:  Judithann Graves, M.D.        DATE OF BIRTH:  October 05, 1955   DATE OF ADMISSION:  06/19/2002  DATE OF DISCHARGE:  06/24/2002                                 DISCHARGE SUMMARY   PRIMARY CARE DOCTOR:  Dr. Jonny Ruiz.   CARDIOLOGIST:  Dr. Pauline Aus.   DISCHARGE DIAGNOSES:  1. Cardiomyopathy, likely dilated idiopathic cardiomyopathy with ejection     fraction of 25-35%.  2. Congestive heart failure.  3. Left bundle branch block.  4. Bicuspid aortic valve.  5. Hypertension.  6. Tobacco abuse.   HISTORY OF PRESENT ILLNESS:  The patient is a 55 year old who presented to  the emergency room with two weeks of shortness of breath, orthopnea, PND,  and rather severe exertional dyspnea.  In the emergency room she was found  to be in congestive heart failure.  She was started on aggressive diuresis  and admitted for rule out myocardial infarction.   HOSPITAL COURSE:  Cardiovascular:  The patient ruled out for myocardial  infarction.  Troponin peaked at 0.10.  Her CPKs were all within normal  limits.  Cardiology consultation was obtained as the patient had been  followed by Dr. Pauline Aus for her bicuspid aortic valve.  They felt that she  should have a cardiac catheterization in order to assess both her valves and  the coronary arteries.  This was done on June 23, 2002.  The results  were noncritical coronary artery disease with moderate left ventricular  dysfunction, EF of 25%, mild aortic stenosis.  Aortic valve area was 1.4 cm  sq without sign of a gradient.  Her wedge pressure was 23.  Her cardiac  output was 2.7 with a cardiac index of 1.75.  Dr. Pauline Aus recommended  continuing ACE inhibitor, beta blocker, and statin and decided to start the  patient on Coumadin as  treatment for her dilated cardiomyopathy.  He is to  follow her up next week at which case he might titrate her medications.   The patient continued to do well and on September 30 she was discharged in  stable condition, afebrile, pulse was 81, blood pressure was 116/72.  She  was alert and oriented x 3.  There was no JVD.  Normal S1, S2.  Rhythm was  regular.  She had the systolic ejection murmur.  Her lungs were clear  without any rales.  Her abdomen was soft.   DISCHARGE LABORATORY DATA:  Within normal limits.   DISCHARGE MEDICATIONS:  1. Coumadin 2 mg p.o. q.d.  2. Lisinopril 10 mg p.o. q.d.  3. Zocor 20 mg p.o. q.h.s.  4. Lasix 40 mg p.o. q.d.  5. Lopressor 25 mg p.o. b.i.d.   DISCHARGE INSTRUCTIONS:  The patient called Dr. Gertie Gowda  office for the first  available appointment.  She is to be followed up in his office July 01, 2002, at 1:45 p.m.  She has an appointment with his physician assistant.  She is to have her INR checked at that appointment.  The above plan was  discussed with Dr. Pauline Aus.  She is to call earlier if she is having increased  shortness of breath, if her weight is increasing, or if she has any chest  pain.  She is to refrain from strenuous exertion until cleared by Dr. Pauline Aus.                                               Judithann Graves, M.D.    AMC/MEDQ  D:  06/24/2002  T:  06/25/2002  Job:  [id number]   cc:   Teodoro Kil. Jonny Ruiz, M.D.  [place]  [place] [address]  Fax: [phone]   Freeman Caldron. Pauline Aus, M.D.  [address] [place]  [place] [address]  Fax: [phone]

## 2011-02-10 NOTE — Op Note (Signed)
NAMEROZENIA, SWANSEN NO.:  [id number]   MEDICAL RECORD NO.:  [id number]          PATIENT TYPE:  INP   LOCATION:  2899                         FACILITY:  [facility]   PHYSICIAN:  Champ Mungo. Lovena Le, M.D.  DATE OF BIRTH:  12-Dec-1955   DATE OF PROCEDURE:  03/24/2005  DATE OF DISCHARGE:                                 OPERATIVE REPORT   PROCEDURE PERFORMED:  Biventricular implantable cardioverter-defibrillator  implantation with defibrillation threshold testing.   SURGEON:  Champ Mungo. Lovena Le, M.D.   I. INTRODUCTION:  The patient is a 55 year old woman with a history of  nonischemic cardiomyopathy secondary to valvular heart disease and  hypertension.  She has class III heart failure and left bundle branch block.  She is referred for BiV ICD implantation as she is maximally medically  treated.   II. PROCEDURE:  After informed consent was obtained, the patient is taken to  the diagnostic EP lab in the fasting state.  After the usual preparation and  draping, intravenous fentanyl and midazolam were given for sedation.  Thirty  milliliters of lidocaine were infiltrated into the left infraclavicular  region.  A 9-cm incision was carried out over this region and electrocautery  utilized to dissect down to the fascial plane.  The left subclavian vein was  documented to be patent after 10 mL of contrast were injected in the left  upper extremity venous system.  The subclavian vein was punctured x3.  The  Guidant model [id number] active-fixation defibrillation lead was advanced  into the right ventricle, the Guidant model [id number] active-fixation  pacing lead was advanced into the right atrium.  Mapping was carried out in  the right ventricle and at the final site, the R-waves measured 13 mV and  the pacing impedance was 598 ohms and the pacing threshold 0.4 volts at 0.5  milliseconds, once lead was actively affixed.  Ten-volt pacing did not  stimulate the diaphragm.  With the  RV lead in satisfactory position,  attention was then turned to placement of the atrial lead.  It was placed in  the right atrial appendage where P waves measured 3.8 mV and the pacing  impedance was 614 ohms.  The pacing threshold was 0.4 volts at 0.5  milliseconds.  With both atrial and RV leads in satisfactory position,  attention was then turned to placement of the LV lead.  Coronary sinus  cannulation was difficult.  Multiple guiding catheters were utilized.  Venography of the right atrium was utilized and contrast was infused.  Ultimately, the coronary sinus was cannulated with a large sweep guiding  catheter.  Venography of the coronary sinus was carried out, demonstrating  an acceptable lateral vein.  This was cannulated using the rapid RAPIDO  guide system.  Venography of this vein was subselectively carried out.  The  RAPIDO was removed and the Guidant EASYTRAK 2, model [id number] LV pacing  lead was advanced by way of the guiding catheter over the angioplasty wire  into the vein.  At the final site, approximately two-thirds from base to  apex, the R-waves measured 20 mV and the  pacing impedance was 981 ohms with  a pacing threshold 0.5 volts at 0.5 milliseconds.  Ten-volt pacing did not  stimulate the diaphragm.  With the LV lead in satisfactory position, both  LV, defibrillation, and right atrial pacing leads were secured to the  subpectoralis fascia with a figure-of-eight silk suture.  Sew-in sleeves  were also secured with silk suture.  Electrocautery was utilized to make a  subcutaneous pocket.  Kanamycin irrigation was utilized to irrigate the  pocket.  Electrocautery was utilized to assure hemostasis.  The Guidant  Contak RENEWAL 3 RF, model H210, serial number [id number], was connected to the  right atrial, right ventricular and left ventricular leads, and placed back  in the subcutaneous pocket.  The generator was secured with silk suture.  The patient was then more deeply  sedated and defibrillation threshold  testing carried out.   After the patient was more deeply sedated with fentanyl and Versed, VF was  induced with a T-wave shock.  A 14-joule shock was delivered which did not  terminate VF.  A 21-joule shock was then delivered which in fact terminated  ventricular fibrillation.  Five minutes were allowed to elapse and a second  DFT test carried out.  Again, VF was induced with a T wave shock and again,  a 21-joule shock was delivered which terminated ventricular fibrillation and  restored sinus rhythm.  With these satisfactory parameters, the incision was  closed with a layer of 2-0 Vicryl, followed by a layer 3-0 Vicryl, followed  by a layer of 4-0 Vicryl.  Benzoin was painted on the skin, Steri-Strips  were applied and a pressure dressing was placed, and the patient was  returned to her room in satisfactory condition.   III. COMPLICATIONS:  There were no immediate procedure complications.   IV. RESULTS:  This demonstrates successful implantation of a Guidant  biventricular ICD in a patient with a nonischemic cardiomyopathy, class III  heart failure, and left bundle branch block.       GWT/MEDQ  D:  03/24/2005  T:  03/24/2005  Job:  [id number]

## 2011-02-10 NOTE — Discharge Summary (Signed)
NAMEHEATHR, SCHWERIN NO.:  [id number]   MEDICAL RECORD NO.:  [id number]          PATIENT TYPE:  INP   LOCATION:  3733                         FACILITY:  [facility]   PHYSICIAN:  Champ Mungo. Lovena Le, M.D.  DATE OF BIRTH:  March 15, 1956   DATE OF ADMISSION:  03/24/2005  DATE OF DISCHARGE:  03/25/2005                           DISCHARGE SUMMARY - REFERRING   SUMMARY OF HISTORY:  Ms. Lupton is a 55 year old female with a nonischemic  cardiomyopathy and history of valvular heart disease.  She was referred to  Dr. Lovena Le on March 09, 2005, in regards to possible cardiodefibrillator  insertion give her ejection fraction of 20% by cardiac MRI, severe AI, as  well as MR.  She is felt not to be a good candidate for valvular heart  surgery.  After evaluating, Dr. Lovena Le felt that given her maximal medical  treatment, she would be a good candidate for BiV-ICD.   Her medical history is also notable for peripheral vascular disease, class  III congestive heart failure, tobacco use.   LABORATORY DATA:  Chest x-ray shows cardiomegaly and bronchitic changes.  Post-procedure chest x-ray did not reveal any pneumothorax, per Dr. Tanna Furry  verbal report.  Admission hemoglobin was 14.2, hematocrit 43.8, normal  indices, platelets 151,000, WBC 3.3.  On March 21, 2005, PT was 27.3 with INR  of 4.2.  On March 24, 2005, PT was 22.1 with an INR of 1.9.  Sodium 141,  potassium 3.7, BUN 18, creatinine 0.9, glucose 98.  The EKG on March 24, 2005  showed ventricular pacing.   HOSPITAL COURSE:  Ms. Koopmans underwent a Guidant Contact Renewal BiV-ICD  without difficulty by Dr. Lovena Le.  He did note she had a difficult CS  cannulation.  On March 25, 2005, she was re-assessed by Dr. Lovena Le.  Dr.  Lovena Le felt that her incision was intact and that she could be discharged  later today.   DISCHARGE DIAGNOSES:  1.  Dilated cardiomyopathy with Class III congestive heart failure symptoms,      status post Guidant  BiV-ICD.  2.  History, as previously.   DISPOSITION:  She was asked to continue her home medications which include:  1.  Coumadin as previously.  2.  Lasix 20 mg b.i.d.  3.  Toprol XL 100 mg 1/2 tablet daily.  4.  Multivitamin daily.  5.  B12 daily.  6.  Crestor 10 mg daily.  7.  Coreg 25 b.i.d.  8.  BiDil 20/37.5 mg b.i.d.  9.  Benicar 20 mg daily.  10. Spironolactone 25 daily.   She was asked to call our office on Monday after 10 a.m. to arrange a PT/INR  in approximately one week, a wound check/pacer clinic in approximately 20  days, and Dr. Lovena Le appointment in approximately three months.  She also  received supplemental discharge instructions for pacer defibrillator  patients.      Raquel Sarna   EW/MEDQ  D:  03/25/2005  T:  03/25/2005  Job:  [id number]   cc:   Freeman Caldron. Pauline Aus, M.D.  [place]  [place] [address]  Fax: [phone]

## 2011-02-14 ENCOUNTER — Encounter: Payer: Self-pay | Admitting: Internal Medicine

## 2011-02-21 ENCOUNTER — Encounter: Payer: Self-pay | Admitting: Internal Medicine

## 2011-02-21 ENCOUNTER — Ambulatory Visit (INDEPENDENT_AMBULATORY_CARE_PROVIDER_SITE_OTHER): Payer: Medicare Other | Admitting: Internal Medicine

## 2011-02-21 VITALS — BP 103/57 | HR 75 | Resp 16 | Ht 64.0 in | Wt 123.0 lb

## 2011-02-21 DIAGNOSIS — I472 Ventricular tachycardia: Secondary | ICD-10-CM

## 2011-02-21 DIAGNOSIS — I509 Heart failure, unspecified: Secondary | ICD-10-CM

## 2011-02-21 DIAGNOSIS — I10 Essential (primary) hypertension: Secondary | ICD-10-CM

## 2011-02-21 DIAGNOSIS — Z95810 Presence of automatic (implantable) cardiac defibrillator: Secondary | ICD-10-CM

## 2011-02-21 NOTE — Assessment & Plan Note (Signed)
Her device is working normally. We'll recheck in several months.

## 2011-02-21 NOTE — Progress Notes (Signed)
HPI [name] returns today for followup. She is a 55 year old woman with a nonischemic cardiomyopathy, congestive heart failure, and ventricular tachycardia. She is status post ICD generator change several months ago. She denies tenderness or pain at her insertion site. She has had no trouble with her incision. She denies fever or chills. No syncope. No chest pain and no shortness of breath. Not on File   Current Outpatient Prescriptions  Medication Sig Dispense Refill  . carvedilol (COREG) 25 MG tablet Take 1 tablet (25 mg total) by mouth 2 (two) times daily with a meal.  180 tablet  3  . furosemide (LASIX) 40 MG tablet Take 40 mg by mouth every other day.        . isosorbide-hydrALAZINE (BIDIL) 20-37.5 MG per tablet Take 1 tablet by mouth 2 (two) times daily.        . Multiple Vitamin (MULTIVITAMIN) capsule Take 1 capsule by mouth daily.        Marland Kitchen olmesartan (BENICAR) 20 MG tablet Take 10 mg by mouth daily.        . rosuvastatin (CRESTOR) 10 MG tablet Take 10 mg by mouth daily.        Marland Kitchen spironolactone (ALDACTONE) 25 MG tablet Take 25 mg by mouth daily.        Marland Kitchen warfarin (COUMADIN) 5 MG tablet Use as directed          Past Medical History  Diagnosis Date  . Aortic stenosis   . Cardiomyopathy secondary   . Hyperlipidemia   . HTN (hypertension)   . Bicuspid aortic valve   . Abnormal liver function tests   . CHF (congestive heart failure)   . Cholecystitis     ROS:   All systems reviewed and negative except as noted in the HPI.   Past Surgical History  Procedure Date  . Laparoscopic cholecystectomy with intraoperative cholangiogram   . Implantation of a guidant biventricular icd      Family History  Problem Relation Age of Onset  . Pancreatic cancer Mother   . Colon cancer Maternal Aunt   . Bone cancer Brother      History   Social History  . Marital Status: Single    Spouse Name: N/A    Number of Children: N/A  . Years of Education: N/A   Occupational  History  . [employer] receptionist    Social History Main Topics  . Smoking status: Former Smoker    Quit date: 09/25/2010  . Smokeless tobacco: Not on file   Comment: none in last 15 days  . Alcohol Use: Yes     occasional  . Drug Use: Not on file  . Sexually Active: Not on file   Other Topics Concern  . Not on file   Social History Narrative  . No narrative on file     BP 103/57  Pulse 75  Resp 16  Ht 5\' 4"  (1.626 m)  Wt 123 lb (55.792 kg)  BMI 21.11 kg/m2  Physical Exam:  Well appearing NAD HEENT: Unremarkable Neck:  No JVD, no thyromegally Lymphatics:  No adenopathy Back:  No CVA tenderness Lungs:  Clear. Well-healed ICD incision. HEART:  Regular rate rhythm with a soft low pitched systolic murmur at the right upper sternal border. Abd:  Flat, positive bowel sounds, no organomegally, no rebound, no guarding Ext:  2 plus pulses, no edema, no cyanosis, no clubbing Skin:  No rashes no nodules Neuro:  CN II through XII intact, motor grossly intact  DEVICE  Normal device function.  See PaceArt for details.   Assess/Plan:

## 2011-02-21 NOTE — Assessment & Plan Note (Signed)
Her chronic systolic heart failure is well compensated. She will continue her regular daily exercise and maintain a low-sodium diet. She will continue her current medications.

## 2011-02-21 NOTE — Patient Instructions (Signed)
Your physician wants you to follow-up in: 12 months with Dr. Taylor. You will receive a reminder letter in the mail two months in advance. If you don't receive a letter, please call our office to schedule the follow-up appointment.    

## 2011-02-21 NOTE — Assessment & Plan Note (Signed)
She has had several nonsustained episodes. These are largely asymptomatic. No additional medical therapy is recommended today.

## 2011-02-21 NOTE — Assessment & Plan Note (Signed)
Her blood pressure is well controlled. I have asked that she maintain her current medical therapy and a low-sodium diet.

## 2011-04-06 ENCOUNTER — Other Ambulatory Visit: Payer: Self-pay | Admitting: *Deleted

## 2011-04-06 MED ORDER — SPIRONOLACTONE 25 MG PO TABS: 25.0000 mg | ORAL_TABLET | Freq: Every day | ORAL | Status: DC

## 2011-05-01 ENCOUNTER — Other Ambulatory Visit: Payer: Self-pay | Admitting: *Deleted

## 2011-05-01 MED ORDER — SPIRONOLACTONE 25 MG PO TABS: 25.0000 mg | ORAL_TABLET | Freq: Every day | ORAL | Status: DC

## 2011-05-01 MED ORDER — ROSUVASTATIN CALCIUM 10 MG PO TABS: 10.0000 mg | ORAL_TABLET | Freq: Every day | ORAL | Status: DC

## 2011-05-25 ENCOUNTER — Other Ambulatory Visit: Payer: Self-pay | Admitting: Internal Medicine

## 2011-05-25 ENCOUNTER — Encounter: Payer: Self-pay | Admitting: Internal Medicine

## 2011-05-25 ENCOUNTER — Ambulatory Visit (INDEPENDENT_AMBULATORY_CARE_PROVIDER_SITE_OTHER): Payer: Medicare Other | Admitting: *Deleted

## 2011-05-25 DIAGNOSIS — I428 Other cardiomyopathies: Secondary | ICD-10-CM

## 2011-05-26 LAB — REMOTE ICD DEVICE
AL AMPLITUDE: 2.9 mv
AL IMPEDENCE ICD: 489 Ohm
ATRIAL PACING ICD: 0 pct
BRDY-0002RA: 40 {beats}/min
BRDY-0003RA: 120 {beats}/min
CHARGE TIME: 8.5 s
DEVICE MODEL ICD: 498164
FVT: 0
HV IMPEDENCE: 52 Ohm
LV LEAD IMPEDENCE ICD: 579 Ohm
PACEART VT: 0
RV LEAD IMPEDENCE ICD: 610 Ohm
TOT-0006: 20120529000000
TZAT-0001FASTVT: 1
TZAT-0001FASTVT: 2
TZAT-0001SLOWVT: 1
TZAT-0001SLOWVT: 2
TZAT-0002FASTVT: NEGATIVE
TZAT-0013FASTVT: 1
TZAT-0013SLOWVT: 2
TZAT-0013SLOWVT: 2
TZAT-0018FASTVT: NEGATIVE
TZAT-0018FASTVT: NEGATIVE
TZAT-0018SLOWVT: NEGATIVE
TZAT-0018SLOWVT: NEGATIVE
TZON-0003FASTVT: 300 ms
TZON-0003SLOWVT: 342.9 ms
TZST-0001FASTVT: 3
TZST-0001FASTVT: 4
TZST-0001FASTVT: 5
TZST-0001FASTVT: 6
TZST-0001FASTVT: 7
TZST-0001FASTVT: 8
TZST-0001SLOWVT: 3
TZST-0001SLOWVT: 4
TZST-0001SLOWVT: 5
TZST-0001SLOWVT: 6
TZST-0001SLOWVT: 7
TZST-0003FASTVT: 31 J
TZST-0003FASTVT: 41 J
TZST-0003FASTVT: 41 J
TZST-0003FASTVT: 41 J
TZST-0003FASTVT: 41 J
TZST-0003FASTVT: 41 J
TZST-0003SLOWVT: 31 J
TZST-0003SLOWVT: 41 J
TZST-0003SLOWVT: 41 J
TZST-0003SLOWVT: 41 J
TZST-0003SLOWVT: 41 J
VENTRICULAR PACING ICD: 100 pct
VF: 0

## 2011-06-08 NOTE — Progress Notes (Signed)
ICD checked by remote. 

## 2011-06-22 ENCOUNTER — Other Ambulatory Visit: Payer: Self-pay | Admitting: *Deleted

## 2011-06-22 MED ORDER — ISOSORB DINITRATE-HYDRALAZINE 20-37.5 MG PO TABS: 1.0000 | ORAL_TABLET | Freq: Two times a day (BID) | ORAL | Status: DC

## 2011-06-29 ENCOUNTER — Other Ambulatory Visit (HOSPITAL_COMMUNITY): Payer: Self-pay | Admitting: Family Medicine

## 2011-06-29 DIAGNOSIS — Z1231 Encounter for screening mammogram for malignant neoplasm of breast: Secondary | ICD-10-CM

## 2011-07-12 ENCOUNTER — Ambulatory Visit (HOSPITAL_COMMUNITY): Payer: Medicare Other

## 2011-07-13 ENCOUNTER — Ambulatory Visit (HOSPITAL_COMMUNITY)
Admission: RE | Admit: 2011-07-13 | Discharge: 2011-07-13 | Disposition: A | Discharge status: Home / Self Care | Payer: Medicare Other | Source: Ambulatory Visit | Attending: Family Medicine | Admitting: Family Medicine

## 2011-07-13 DIAGNOSIS — Z1231 Encounter for screening mammogram for malignant neoplasm of breast: Secondary | ICD-10-CM

## 2011-07-18 ENCOUNTER — Other Ambulatory Visit: Payer: Self-pay | Admitting: *Deleted

## 2011-07-18 NOTE — Telephone Encounter (Signed)
Open in error

## 2011-08-31 ENCOUNTER — Encounter: Payer: Self-pay | Admitting: Internal Medicine

## 2011-08-31 ENCOUNTER — Ambulatory Visit (INDEPENDENT_AMBULATORY_CARE_PROVIDER_SITE_OTHER): Payer: Medicare Other | Admitting: *Deleted

## 2011-08-31 DIAGNOSIS — I472 Ventricular tachycardia: Secondary | ICD-10-CM

## 2011-08-31 DIAGNOSIS — Z95810 Presence of automatic (implantable) cardiac defibrillator: Secondary | ICD-10-CM

## 2011-09-06 ENCOUNTER — Other Ambulatory Visit: Payer: Self-pay

## 2011-09-06 DIAGNOSIS — I10 Essential (primary) hypertension: Secondary | ICD-10-CM

## 2011-09-06 LAB — REMOTE ICD DEVICE
AL AMPLITUDE: 3.9 mv
AL IMPEDENCE ICD: 494 Ohm
ATRIAL PACING ICD: 0 pct
BRDY-0002RA: 40 {beats}/min
BRDY-0003RA: 120 {beats}/min
CHARGE TIME: 8.6 s
DEVICE MODEL ICD: 498164
FVT: 0
HV IMPEDENCE: 52 Ohm
LV LEAD IMPEDENCE ICD: 601 Ohm
PACEART VT: 0
RV LEAD IMPEDENCE ICD: 597 Ohm
TOT-0006: 20120830000000
TZAT-0001FASTVT: 1
TZAT-0001FASTVT: 2
TZAT-0001SLOWVT: 1
TZAT-0001SLOWVT: 2
TZAT-0002FASTVT: NEGATIVE
TZAT-0013FASTVT: 1
TZAT-0013SLOWVT: 2
TZAT-0013SLOWVT: 2
TZAT-0018FASTVT: NEGATIVE
TZAT-0018FASTVT: NEGATIVE
TZAT-0018SLOWVT: NEGATIVE
TZAT-0018SLOWVT: NEGATIVE
TZON-0003FASTVT: 300 ms
TZON-0003SLOWVT: 342.9 ms
TZST-0001FASTVT: 3
TZST-0001FASTVT: 4
TZST-0001FASTVT: 5
TZST-0001FASTVT: 6
TZST-0001FASTVT: 7
TZST-0001FASTVT: 8
TZST-0001SLOWVT: 3
TZST-0001SLOWVT: 4
TZST-0001SLOWVT: 5
TZST-0001SLOWVT: 6
TZST-0001SLOWVT: 7
TZST-0003FASTVT: 31 J
TZST-0003FASTVT: 41 J
TZST-0003FASTVT: 41 J
TZST-0003FASTVT: 41 J
TZST-0003FASTVT: 41 J
TZST-0003FASTVT: 41 J
TZST-0003SLOWVT: 31 J
TZST-0003SLOWVT: 41 J
TZST-0003SLOWVT: 41 J
TZST-0003SLOWVT: 41 J
TZST-0003SLOWVT: 41 J
VENTRICULAR PACING ICD: 100 pct
VF: 0

## 2011-09-06 MED ORDER — CARVEDILOL 25 MG PO TABS: 25.0000 mg | ORAL_TABLET | Freq: Two times a day (BID) | ORAL | Status: DC

## 2011-09-12 NOTE — Progress Notes (Signed)
Remote icd check  

## 2011-09-29 ENCOUNTER — Other Ambulatory Visit: Payer: Self-pay

## 2011-09-29 DIAGNOSIS — I10 Essential (primary) hypertension: Secondary | ICD-10-CM

## 2011-11-03 ENCOUNTER — Other Ambulatory Visit: Payer: Self-pay

## 2011-11-03 DIAGNOSIS — I10 Essential (primary) hypertension: Secondary | ICD-10-CM

## 2011-11-03 MED ORDER — CARVEDILOL 25 MG PO TABS: 25.0000 mg | ORAL_TABLET | Freq: Two times a day (BID) | ORAL | Status: DC

## 2011-11-10 DIAGNOSIS — F341 Dysthymic disorder: Secondary | ICD-10-CM | POA: Diagnosis not present

## 2011-11-10 DIAGNOSIS — I509 Heart failure, unspecified: Secondary | ICD-10-CM | POA: Diagnosis not present

## 2011-11-10 DIAGNOSIS — Z7901 Long term (current) use of anticoagulants: Secondary | ICD-10-CM | POA: Diagnosis not present

## 2011-11-22 ENCOUNTER — Other Ambulatory Visit: Payer: Self-pay | Admitting: *Deleted

## 2011-12-07 ENCOUNTER — Encounter: Payer: Self-pay | Admitting: Internal Medicine

## 2011-12-07 ENCOUNTER — Ambulatory Visit (INDEPENDENT_AMBULATORY_CARE_PROVIDER_SITE_OTHER): Payer: Medicare Other | Admitting: *Deleted

## 2011-12-07 DIAGNOSIS — I429 Cardiomyopathy, unspecified: Secondary | ICD-10-CM | POA: Diagnosis not present

## 2011-12-07 DIAGNOSIS — I472 Ventricular tachycardia: Secondary | ICD-10-CM | POA: Diagnosis not present

## 2011-12-08 LAB — REMOTE ICD DEVICE
AL AMPLITUDE: 4.3 mv
AL IMPEDENCE ICD: 476 Ohm
ATRIAL PACING ICD: 0 pct
BRDY-0002RA: 40 {beats}/min
BRDY-0003RA: 120 {beats}/min
CHARGE TIME: 8.6 s
DEVICE MODEL ICD: 498164
FVT: 0
HV IMPEDENCE: 53 Ohm
LV LEAD IMPEDENCE ICD: 579 Ohm
PACEART VT: 0
RV LEAD IMPEDENCE ICD: 589 Ohm
TOT-0006: 20130307000000
TZAT-0001FASTVT: 1
TZAT-0001FASTVT: 2
TZAT-0001SLOWVT: 1
TZAT-0001SLOWVT: 2
TZAT-0002FASTVT: NEGATIVE
TZAT-0013FASTVT: 1
TZAT-0013SLOWVT: 2
TZAT-0013SLOWVT: 2
TZAT-0018FASTVT: NEGATIVE
TZAT-0018FASTVT: NEGATIVE
TZAT-0018SLOWVT: NEGATIVE
TZAT-0018SLOWVT: NEGATIVE
TZON-0003FASTVT: 300 ms
TZON-0003SLOWVT: 342.9 ms
TZST-0001FASTVT: 3
TZST-0001FASTVT: 4
TZST-0001FASTVT: 5
TZST-0001FASTVT: 6
TZST-0001FASTVT: 7
TZST-0001FASTVT: 8
TZST-0001SLOWVT: 3
TZST-0001SLOWVT: 4
TZST-0001SLOWVT: 5
TZST-0001SLOWVT: 6
TZST-0001SLOWVT: 7
TZST-0003FASTVT: 31 J
TZST-0003FASTVT: 41 J
TZST-0003FASTVT: 41 J
TZST-0003FASTVT: 41 J
TZST-0003FASTVT: 41 J
TZST-0003FASTVT: 41 J
TZST-0003SLOWVT: 31 J
TZST-0003SLOWVT: 41 J
TZST-0003SLOWVT: 41 J
TZST-0003SLOWVT: 41 J
TZST-0003SLOWVT: 41 J
VENTRICULAR PACING ICD: 100 pct
VF: 0

## 2011-12-15 DIAGNOSIS — I509 Heart failure, unspecified: Secondary | ICD-10-CM | POA: Diagnosis not present

## 2011-12-15 DIAGNOSIS — F329 Major depressive disorder, single episode, unspecified: Secondary | ICD-10-CM | POA: Diagnosis not present

## 2011-12-15 DIAGNOSIS — I428 Other cardiomyopathies: Secondary | ICD-10-CM | POA: Diagnosis not present

## 2011-12-15 DIAGNOSIS — Z7901 Long term (current) use of anticoagulants: Secondary | ICD-10-CM | POA: Diagnosis not present

## 2011-12-15 DIAGNOSIS — I10 Essential (primary) hypertension: Secondary | ICD-10-CM | POA: Diagnosis not present

## 2011-12-20 ENCOUNTER — Encounter: Payer: Self-pay | Admitting: *Deleted

## 2011-12-21 NOTE — Progress Notes (Signed)
Remote icd check  

## 2012-01-02 ENCOUNTER — Telehealth: Payer: Self-pay | Admitting: Internal Medicine

## 2012-01-02 NOTE — Telephone Encounter (Signed)
Walk In pt form " Pt Dropped Off Supplementary Claim Disability Benefits papers, No ROI or PMT collected, will forward to [employer] for Completion  01/02/12/Km

## 2012-01-11 ENCOUNTER — Encounter: Payer: Self-pay | Admitting: Internal Medicine

## 2012-01-16 DIAGNOSIS — Z7901 Long term (current) use of anticoagulants: Secondary | ICD-10-CM | POA: Diagnosis not present

## 2012-01-22 ENCOUNTER — Other Ambulatory Visit: Payer: Self-pay | Admitting: *Deleted

## 2012-01-22 MED ORDER — SPIRONOLACTONE 25 MG PO TABS: 25.0000 mg | ORAL_TABLET | Freq: Every day | ORAL | Status: DC

## 2012-02-01 ENCOUNTER — Other Ambulatory Visit: Payer: Self-pay | Admitting: Internal Medicine

## 2012-02-01 MED ORDER — SPIRONOLACTONE 25 MG PO TABS: 25.0000 mg | ORAL_TABLET | Freq: Every day | ORAL | Status: DC

## 2012-02-08 DIAGNOSIS — H04129 Dry eye syndrome of unspecified lacrimal gland: Secondary | ICD-10-CM | POA: Diagnosis not present

## 2012-02-08 DIAGNOSIS — H10029 Other mucopurulent conjunctivitis, unspecified eye: Secondary | ICD-10-CM | POA: Diagnosis not present

## 2012-02-27 ENCOUNTER — Ambulatory Visit (INDEPENDENT_AMBULATORY_CARE_PROVIDER_SITE_OTHER): Payer: Medicare Other | Admitting: Internal Medicine

## 2012-02-27 ENCOUNTER — Ambulatory Visit (HOSPITAL_COMMUNITY): Payer: Medicare Other | Attending: Cardiovascular Disease | Admitting: Radiology

## 2012-02-27 ENCOUNTER — Encounter: Payer: Self-pay | Admitting: Internal Medicine

## 2012-02-27 VITALS — BP 107/61 | HR 80 | Ht 63.0 in | Wt 125.0 lb

## 2012-02-27 DIAGNOSIS — I359 Nonrheumatic aortic valve disorder, unspecified: Secondary | ICD-10-CM

## 2012-02-27 DIAGNOSIS — I2589 Other forms of chronic ischemic heart disease: Secondary | ICD-10-CM

## 2012-02-27 DIAGNOSIS — Z95810 Presence of automatic (implantable) cardiac defibrillator: Secondary | ICD-10-CM | POA: Diagnosis not present

## 2012-02-27 DIAGNOSIS — I509 Heart failure, unspecified: Secondary | ICD-10-CM

## 2012-02-27 DIAGNOSIS — E785 Hyperlipidemia, unspecified: Secondary | ICD-10-CM | POA: Insufficient documentation

## 2012-02-27 DIAGNOSIS — I350 Nonrheumatic aortic (valve) stenosis: Secondary | ICD-10-CM

## 2012-02-27 DIAGNOSIS — I5022 Chronic systolic (congestive) heart failure: Secondary | ICD-10-CM | POA: Diagnosis not present

## 2012-02-27 DIAGNOSIS — I10 Essential (primary) hypertension: Secondary | ICD-10-CM | POA: Insufficient documentation

## 2012-02-27 DIAGNOSIS — Z87891 Personal history of nicotine dependence: Secondary | ICD-10-CM | POA: Insufficient documentation

## 2012-02-27 DIAGNOSIS — I429 Cardiomyopathy, unspecified: Secondary | ICD-10-CM | POA: Diagnosis not present

## 2012-02-27 LAB — ICD DEVICE OBSERVATION
AL AMPLITUDE: 6.1 mv
AL IMPEDENCE ICD: 493 Ohm
AL THRESHOLD: 0.9 V
DEVICE MODEL ICD: 498164
HV IMPEDENCE: 57 Ohm
LV LEAD AMPLITUDE: 19 mv
LV LEAD IMPEDENCE ICD: 651 Ohm
LV LEAD THRESHOLD: 0.9 V
RV LEAD AMPLITUDE: 14.9 mv
RV LEAD IMPEDENCE ICD: 630 Ohm
RV LEAD THRESHOLD: 0.7 V
TZAT-0001FASTVT: 1
TZAT-0001FASTVT: 2
TZAT-0001SLOWVT: 1
TZAT-0001SLOWVT: 2
TZAT-0002FASTVT: NEGATIVE
TZAT-0013FASTVT: 1
TZAT-0013SLOWVT: 2
TZAT-0013SLOWVT: 2
TZAT-0018FASTVT: NEGATIVE
TZAT-0018FASTVT: NEGATIVE
TZAT-0018SLOWVT: NEGATIVE
TZAT-0018SLOWVT: NEGATIVE
TZON-0003FASTVT: 300 ms
TZON-0003SLOWVT: 342.9 ms
TZST-0001FASTVT: 3
TZST-0001FASTVT: 4
TZST-0001FASTVT: 5
TZST-0001FASTVT: 6
TZST-0001FASTVT: 7
TZST-0001FASTVT: 8
TZST-0001SLOWVT: 3
TZST-0001SLOWVT: 4
TZST-0001SLOWVT: 5
TZST-0001SLOWVT: 6
TZST-0001SLOWVT: 7
TZST-0003FASTVT: 31 J
TZST-0003FASTVT: 41 J
TZST-0003FASTVT: 41 J
TZST-0003FASTVT: 41 J
TZST-0003FASTVT: 41 J
TZST-0003FASTVT: 41 J
TZST-0003SLOWVT: 31 J
TZST-0003SLOWVT: 41 J
TZST-0003SLOWVT: 41 J
TZST-0003SLOWVT: 41 J
TZST-0003SLOWVT: 41 J

## 2012-02-27 NOTE — Assessment & Plan Note (Signed)
Her device is working normally. She is biventricular pacing. No recent ICD shocks.

## 2012-02-27 NOTE — Assessment & Plan Note (Signed)
Her chronic systolic heart failure is currently class II. She will continue her current medical therapy, and maintain a low-sodium diet.

## 2012-02-27 NOTE — Progress Notes (Signed)
HPI [name] returns today for followup. She is a very pleasant 56 year old woman with a long-standing nonischemic cardiomyopathy, left bundle branch block, chronic systolic heart failure, status post biventricular ICD implantation. Her heart failure remains class II. She denies chest pain, shortness of breath, or any recent ICD discharge. No syncope. No peripheral edema. No Known Allergies   Current Outpatient Prescriptions  Medication Sig Dispense Refill  . carvedilol (COREG) 25 MG tablet Take 1 tablet (25 mg total) by mouth 2 (two) times daily with a meal.  180 tablet  3  . furosemide (LASIX) 40 MG tablet Take 40 mg by mouth every other day.        . isosorbide-hydrALAZINE (BIDIL) 20-37.5 MG per tablet Take 1 tablet by mouth 2 (two) times daily.  180 tablet  2  . Multiple Vitamin (MULTIVITAMIN) capsule Take 1 capsule by mouth daily.        Marland Kitchen olmesartan (BENICAR) 20 MG tablet Take 10 mg by mouth daily.        . rosuvastatin (CRESTOR) 10 MG tablet Take 1 tablet (10 mg total) by mouth daily.  90 tablet  3  . spironolactone (ALDACTONE) 25 MG tablet Take 1 tablet (25 mg total) by mouth daily.  90 tablet  0  . warfarin (COUMADIN) 5 MG tablet Use as directed          Past Medical History  Diagnosis Date  . Aortic stenosis   . Cardiomyopathy secondary   . Hyperlipidemia   . HTN (hypertension)   . Bicuspid aortic valve   . Abnormal liver function tests   . CHF (congestive heart failure)   . Cholecystitis     ROS:   All systems reviewed and negative except as noted in the HPI.   Past Surgical History  Procedure Date  . Laparoscopic cholecystectomy with intraoperative cholangiogram   . Implantation of a guidant biventricular icd      Family History  Problem Relation Age of Onset  . Pancreatic cancer Mother   . Colon cancer Maternal Aunt   . Bone cancer Brother      History   Social History  . Marital Status: Single    Spouse Name: N/A    Number of Children: N/A  .  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 Smoking status: Former Smoker    Quit date: 09/25/2010  . Smokeless tobacco: Not on file   Comment: none in last 15 days  . Alcohol Use: Yes     occasional  . Drug Use: Not on file  . Sexually Active: Not on file   Other Topics Concern  . Not on file   Social History Narrative  . No narrative on file     BP 107/61  Pulse 80  Ht 5\' 3"  (1.6 m)  Wt 125 lb (56.7 kg)  BMI 22.14 kg/m2  Physical Exam:  Well appearing middle-aged woman, NAD HEENT: Unremarkable Neck:  7 cm JVD, no thyromegally Lungs:  Clear with no wheezes, rales, or rhonchi. Well-healed ICD incision. HEART:  Regular rate rhythm, grade 2/6 systolic murmur heard best at the right upper sternal border., no rubs, no clicks, S2 is markedly reduced. Abd:  soft, positive bowel sounds, no organomegally, no rebound, no guarding Ext:  2 plus pulses, no edema, no cyanosis, no clubbing Skin:  No rashes no nodules Neuro:  CN II through XII intact, motor grossly intact  DEVICE  Normal device function.  See PaceArt for details.   Assess/Plan:

## 2012-02-27 NOTE — Patient Instructions (Addendum)
Your physician has requested that you have an echocardiogram. Echocardiography is a painless test that uses sound waves to create images of your heart. It provides your doctor with information about the size and shape of your heart and how well your heart's chambers and valves are working. This procedure takes approximately one hour. There are no restrictions for this procedure.---TODAY if possible for AS    Remote monitoring is used to monitor your Pacemaker of ICD from home. This monitoring reduces the number of office visits required to check your device to one time per year. It allows [place] to keep an eye on the functioning of your device to ensure it is working properly. You are scheduled for a device check from home on 06/06/12. You may send your transmission at any time that day. If you have a wireless device, the transmission will be sent automatically. After your physician reviews your transmission, you will receive a postcard with your next transmission date.  Your physician wants you to follow-up in: 12 months with Dr Knox Saliva will receive a reminder letter in the mail two months in advance. If you don't receive a letter, please call our office to schedule the follow-up appointment.

## 2012-02-27 NOTE — Assessment & Plan Note (Signed)
By exam, her aortic stenosis appears to be worsened. She is minimally symptomatic. I will recommend a repeat 2-D echocardiogram.

## 2012-02-27 NOTE — Progress Notes (Signed)
Echocardiogram performed.  

## 2012-03-14 DIAGNOSIS — Z79899 Other long term (current) drug therapy: Secondary | ICD-10-CM | POA: Diagnosis not present

## 2012-03-14 DIAGNOSIS — I428 Other cardiomyopathies: Secondary | ICD-10-CM | POA: Diagnosis not present

## 2012-03-14 DIAGNOSIS — I10 Essential (primary) hypertension: Secondary | ICD-10-CM | POA: Diagnosis not present

## 2012-03-14 DIAGNOSIS — F411 Generalized anxiety disorder: Secondary | ICD-10-CM | POA: Diagnosis not present

## 2012-03-14 DIAGNOSIS — E7800 Pure hypercholesterolemia, unspecified: Secondary | ICD-10-CM | POA: Diagnosis not present

## 2012-03-14 DIAGNOSIS — Z7901 Long term (current) use of anticoagulants: Secondary | ICD-10-CM | POA: Diagnosis not present

## 2012-04-02 ENCOUNTER — Telehealth: Payer: Self-pay | Admitting: Internal Medicine

## 2012-04-02 DIAGNOSIS — R5381 Other malaise: Secondary | ICD-10-CM | POA: Diagnosis not present

## 2012-04-02 DIAGNOSIS — I428 Other cardiomyopathies: Secondary | ICD-10-CM | POA: Diagnosis not present

## 2012-04-02 DIAGNOSIS — R5383 Other fatigue: Secondary | ICD-10-CM | POA: Diagnosis not present

## 2012-04-02 DIAGNOSIS — Z7901 Long term (current) use of anticoagulants: Secondary | ICD-10-CM | POA: Diagnosis not present

## 2012-04-02 DIAGNOSIS — I998 Other disorder of circulatory system: Secondary | ICD-10-CM | POA: Diagnosis not present

## 2012-04-02 NOTE — Telephone Encounter (Signed)
Mrs Vaneaton is calling for the results of her echo.

## 2012-04-02 NOTE — Telephone Encounter (Signed)
New msg Pt was calling about echo results. She also said she went to her pcp and her BP was low 87/47. Please call her back

## 2012-04-02 NOTE — Telephone Encounter (Signed)
Pt states her BP was low today at her pcp's office so her benicar was decreased to 5mg  daily.

## 2012-04-16 DIAGNOSIS — I10 Essential (primary) hypertension: Secondary | ICD-10-CM | POA: Diagnosis not present

## 2012-04-16 DIAGNOSIS — Z7901 Long term (current) use of anticoagulants: Secondary | ICD-10-CM | POA: Diagnosis not present

## 2012-04-16 DIAGNOSIS — I428 Other cardiomyopathies: Secondary | ICD-10-CM | POA: Diagnosis not present

## 2012-04-16 DIAGNOSIS — R5381 Other malaise: Secondary | ICD-10-CM | POA: Diagnosis not present

## 2012-04-16 DIAGNOSIS — I509 Heart failure, unspecified: Secondary | ICD-10-CM | POA: Diagnosis not present

## 2012-04-17 ENCOUNTER — Telehealth: Payer: Self-pay | Admitting: Internal Medicine

## 2012-04-17 NOTE — Telephone Encounter (Signed)
Her BP is still 88/44  She is not symptomatic however.  She feels better since the decrease in Benicar on 03/14/12.  She is now taking 5mg  of Benicar daily.

## 2012-04-17 NOTE — Telephone Encounter (Signed)
New Problem:    Patient called in wanting about her ECHO results and had a few questions about a drop in her BP.  Please call back.

## 2012-04-18 NOTE — Telephone Encounter (Signed)
She is going to see Dr Lovena Le next week on 04/23/12

## 2012-04-18 NOTE — Telephone Encounter (Signed)
Will set up to see Dr Lovena Le soon per him

## 2012-04-23 ENCOUNTER — Ambulatory Visit (INDEPENDENT_AMBULATORY_CARE_PROVIDER_SITE_OTHER): Payer: Medicare Other | Admitting: Internal Medicine

## 2012-04-23 ENCOUNTER — Encounter: Payer: Self-pay | Admitting: Internal Medicine

## 2012-04-23 ENCOUNTER — Other Ambulatory Visit: Payer: Self-pay | Admitting: Cardiology

## 2012-04-23 VITALS — BP 110/60 | HR 70 | Ht 64.0 in | Wt 124.4 lb

## 2012-04-23 DIAGNOSIS — I10 Essential (primary) hypertension: Secondary | ICD-10-CM

## 2012-04-23 DIAGNOSIS — Z95810 Presence of automatic (implantable) cardiac defibrillator: Secondary | ICD-10-CM

## 2012-04-23 DIAGNOSIS — I472 Ventricular tachycardia: Secondary | ICD-10-CM

## 2012-04-23 DIAGNOSIS — I350 Nonrheumatic aortic (valve) stenosis: Secondary | ICD-10-CM

## 2012-04-23 DIAGNOSIS — I359 Nonrheumatic aortic valve disorder, unspecified: Secondary | ICD-10-CM | POA: Diagnosis not present

## 2012-04-23 DIAGNOSIS — I429 Cardiomyopathy, unspecified: Secondary | ICD-10-CM | POA: Diagnosis not present

## 2012-04-23 LAB — ICD DEVICE OBSERVATION
AL AMPLITUDE: 4.8 mv
AL IMPEDENCE ICD: 483 Ohm
AL THRESHOLD: 1 V
ATRIAL PACING ICD: 1 pct
CHARGE TIME: 8.7 s
DEVICE MODEL ICD: 498164
HV IMPEDENCE: 56 Ohm
LV LEAD AMPLITUDE: 16.4 mv
LV LEAD IMPEDENCE ICD: 674 Ohm
LV LEAD THRESHOLD: 1.1 V
RV LEAD AMPLITUDE: 18.1 mv
RV LEAD IMPEDENCE ICD: 602 Ohm
RV LEAD THRESHOLD: 0.9 V
TZAT-0001FASTVT: 1
TZAT-0001FASTVT: 2
TZAT-0001SLOWVT: 1
TZAT-0001SLOWVT: 2
TZAT-0002FASTVT: NEGATIVE
TZAT-0013FASTVT: 1
TZAT-0013SLOWVT: 2
TZAT-0013SLOWVT: 2
TZAT-0018FASTVT: NEGATIVE
TZAT-0018FASTVT: NEGATIVE
TZAT-0018SLOWVT: NEGATIVE
TZAT-0018SLOWVT: NEGATIVE
TZON-0003FASTVT: 300 ms
TZON-0003SLOWVT: 342.9 ms
TZST-0001FASTVT: 3
TZST-0001FASTVT: 4
TZST-0001FASTVT: 5
TZST-0001FASTVT: 6
TZST-0001FASTVT: 7
TZST-0001FASTVT: 8
TZST-0001SLOWVT: 3
TZST-0001SLOWVT: 4
TZST-0001SLOWVT: 5
TZST-0001SLOWVT: 6
TZST-0001SLOWVT: 7
TZST-0003FASTVT: 31 J
TZST-0003FASTVT: 41 J
TZST-0003FASTVT: 41 J
TZST-0003FASTVT: 41 J
TZST-0003FASTVT: 41 J
TZST-0003FASTVT: 41 J
TZST-0003SLOWVT: 31 J
TZST-0003SLOWVT: 41 J
TZST-0003SLOWVT: 41 J
TZST-0003SLOWVT: 41 J
TZST-0003SLOWVT: 41 J
VENTRICULAR PACING ICD: 100 pct

## 2012-04-23 MED ORDER — ISOSORB DINITRATE-HYDRALAZINE 20-37.5 MG PO TABS: 1.0000 | ORAL_TABLET | Freq: Two times a day (BID) | ORAL | Status: DC

## 2012-04-23 NOTE — Progress Notes (Signed)
HPI [name] returns today for followup. She is a pleasant middle aged woman with a h/o chronic systolic CHF, LBBB, s/p BiV ICD with marked improvement in her LV function, (30% to 55%) after BiV ICD insertion. Over the past 2-3 yrs, she has developed an increasingly concerning AS murmur. Her 2D echo 3 years ago demonstrated moderate aortic stenosis, and she underwent repeat echo 2 months ago which demonstrated severe AS with a mean gradient of 40 mm Hg. Despite this, she remains active and denies chest pain or sob. No trouble walking up two flights of stairs. No edema, chest pain or syncope.  No Known Allergies   Current Outpatient Prescriptions  Medication Sig Dispense Refill  . carvedilol (COREG) 25 MG tablet Take 1 tablet (25 mg total) by mouth 2 (two) times daily with a meal.  180 tablet  3  . citalopram (CELEXA) 20 MG tablet Take 10 mg by mouth daily.      . furosemide (LASIX) 40 MG tablet Take 40 mg by mouth every other day.        . Multiple Vitamin (MULTIVITAMIN) capsule Take 1 capsule by mouth daily.        Marland Kitchen olmesartan (BENICAR) 20 MG tablet Take 10 mg by mouth daily.        . rosuvastatin (CRESTOR) 10 MG tablet Take 1 tablet (10 mg total) by mouth daily.  90 tablet  3  . spironolactone (ALDACTONE) 25 MG tablet Take 1 tablet (25 mg total) by mouth daily.  90 tablet  0  . warfarin (COUMADIN) 5 MG tablet Use as directed       . isosorbide-hydrALAZINE (BIDIL) 20-37.5 MG per tablet Take 1 tablet by mouth 2 (two) times daily.  180 tablet  3     Past Medical History  Diagnosis Date  . Aortic stenosis   . Cardiomyopathy secondary   . Hyperlipidemia   . HTN (hypertension)   . Bicuspid aortic valve   . Abnormal liver function tests   . CHF (congestive heart failure)   . Cholecystitis     ROS:   All systems reviewed and negative except as noted in the HPI.   Past Surgical History  Procedure Date  . Laparoscopic cholecystectomy with intraoperative cholangiogram   .  Implantation of a guidant biventricular icd      Family History  Problem Relation Age of Onset  . Pancreatic cancer Mother   . Colon cancer Maternal Aunt   . Bone cancer Brother      History   Social History  . Marital Status: Single    Spouse Name: N/A    Number of Children: N/A  . 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 Smoking status: Former Smoker    Quit date: 09/25/2010  . Smokeless tobacco: Not on file   Comment: none in last 15 days  . Alcohol Use: Yes     occasional  . Drug Use: Not on file  . Sexually Active: Not on file   Other Topics Concern  . Not on file   Social History Narrative  . No narrative on file     BP 110/60  Pulse 70  Ht 5\' 4"  (1.626 m)  Wt 124 lb 6.4 oz (56.427 kg)  BMI 21.35 kg/m2  Physical Exam:  Well appearing NAD HEENT: Unremarkable Neck:  No JVD, no thyromegally Lungs:  Clear with no wheezes, rales, or rhonchi. HEART:  Regular rate rhythm, 2/6 AS murmur, no rubs, no clicks, S2 is reduced. Abd:  soft, positive bowel sounds, no organomegally, no rebound, no guarding Ext:  2 plus pulses, no edema, no cyanosis, no clubbing Skin:  No rashes no nodules Neuro:  CN II through XII intact, motor grossly intact  DEVICE  Normal device function.  See PaceArt for details.   Assess/Plan:

## 2012-04-23 NOTE — Patient Instructions (Addendum)
Your physician wants you to follow-up in: 12 months with Dr Knox Saliva will receive a reminder letter in the mail two months in advance. If you don't receive a letter, please call our office to schedule the follow-up appointment.   Remote monitoring is used to monitor your Pacemaker of ICD from home. This monitoring reduces the number of office visits required to check your device to one time per year. It allows [place] to keep an eye on the functioning of your device to ensure it is working properly. You are scheduled for a device check from home on 07/25/12. You may send your transmission at any time that day. If you have a wireless device, the transmission will be sent automatically. After your physician reviews your transmission, you will receive a postcard with your next transmission date.   Your physician has requested that you have an echocardiogram. Echocardiography is a painless test that uses sound waves to create images of your heart. It provides your doctor with information about the size and shape of your heart and how well your heart's chambers and valves are working. This procedure takes approximately one hour. There are no restrictions for this procedure.---IN June ---Please call before if you have any chest pain, Shortness of breath, or you pass out

## 2012-04-23 NOTE — Assessment & Plan Note (Signed)
Her blood pressure is well controlled. She will continue a low sodium diet.

## 2012-04-23 NOTE — Assessment & Plan Note (Signed)
She has severe AS but is currently asymptomatic. I have recommended that she undergo watchful waiting. If she develops any symptoms then I will refer her for AVR.

## 2012-04-23 NOTE — Assessment & Plan Note (Signed)
Her device is working normally. Continue current meds.

## 2012-05-07 ENCOUNTER — Other Ambulatory Visit: Payer: Self-pay

## 2012-05-07 MED ORDER — SPIRONOLACTONE 25 MG PO TABS: 25.0000 mg | ORAL_TABLET | Freq: Every day | ORAL | Status: DC

## 2012-05-07 NOTE — Telephone Encounter (Signed)
..   Requested Prescriptions   Signed Prescriptions Disp Refills  . spironolactone (ALDACTONE) 25 MG tablet 90 tablet 3    Sig: Take 1 tablet (25 mg total) by mouth daily.    Authorizing Provider: Evans Lance    Ordering User: Ardis Hughs, Elaysha Bevard Jerilynn Mages

## 2012-06-05 DIAGNOSIS — Z7901 Long term (current) use of anticoagulants: Secondary | ICD-10-CM | POA: Diagnosis not present

## 2012-06-21 ENCOUNTER — Other Ambulatory Visit (HOSPITAL_COMMUNITY): Payer: Self-pay | Admitting: Family Medicine

## 2012-06-21 DIAGNOSIS — Z1231 Encounter for screening mammogram for malignant neoplasm of breast: Secondary | ICD-10-CM

## 2012-07-05 ENCOUNTER — Ambulatory Visit (HOSPITAL_COMMUNITY): Payer: Medicare Other

## 2012-07-17 ENCOUNTER — Ambulatory Visit (HOSPITAL_COMMUNITY): Payer: Medicare Other

## 2012-07-23 DIAGNOSIS — Z23 Encounter for immunization: Secondary | ICD-10-CM | POA: Diagnosis not present

## 2012-07-23 DIAGNOSIS — Z7901 Long term (current) use of anticoagulants: Secondary | ICD-10-CM | POA: Diagnosis not present

## 2012-07-25 ENCOUNTER — Ambulatory Visit (INDEPENDENT_AMBULATORY_CARE_PROVIDER_SITE_OTHER): Payer: Medicare Other | Admitting: *Deleted

## 2012-07-25 ENCOUNTER — Encounter: Payer: Self-pay | Admitting: Internal Medicine

## 2012-07-25 DIAGNOSIS — I472 Ventricular tachycardia, unspecified: Secondary | ICD-10-CM

## 2012-07-25 DIAGNOSIS — Z95810 Presence of automatic (implantable) cardiac defibrillator: Secondary | ICD-10-CM

## 2012-07-26 LAB — REMOTE ICD DEVICE
AL AMPLITUDE: 5.1 mv
AL IMPEDENCE ICD: 492 Ohm
ATRIAL PACING ICD: 0 pct
BRDY-0002RA: 40 {beats}/min
BRDY-0003RA: 120 {beats}/min
CHARGE TIME: 8.7 s
DEVICE MODEL ICD: 498164
FVT: 0
HV IMPEDENCE: 58 Ohm
LV LEAD IMPEDENCE ICD: 786 Ohm
PACEART VT: 0
RV LEAD IMPEDENCE ICD: 623 Ohm
TOT-0006: 20130730000000
TZAT-0001FASTVT: 1
TZAT-0001FASTVT: 2
TZAT-0001SLOWVT: 1
TZAT-0001SLOWVT: 2
TZAT-0002FASTVT: NEGATIVE
TZAT-0013FASTVT: 1
TZAT-0013SLOWVT: 2
TZAT-0013SLOWVT: 2
TZAT-0018FASTVT: NEGATIVE
TZAT-0018FASTVT: NEGATIVE
TZAT-0018SLOWVT: NEGATIVE
TZAT-0018SLOWVT: NEGATIVE
TZON-0003FASTVT: 300 ms
TZON-0003SLOWVT: 342.9 ms
TZST-0001FASTVT: 3
TZST-0001FASTVT: 4
TZST-0001FASTVT: 5
TZST-0001FASTVT: 6
TZST-0001FASTVT: 7
TZST-0001FASTVT: 8
TZST-0001SLOWVT: 3
TZST-0001SLOWVT: 4
TZST-0001SLOWVT: 5
TZST-0001SLOWVT: 6
TZST-0001SLOWVT: 7
TZST-0003FASTVT: 31 J
TZST-0003FASTVT: 41 J
TZST-0003FASTVT: 41 J
TZST-0003FASTVT: 41 J
TZST-0003FASTVT: 41 J
TZST-0003FASTVT: 41 J
TZST-0003SLOWVT: 31 J
TZST-0003SLOWVT: 41 J
TZST-0003SLOWVT: 41 J
TZST-0003SLOWVT: 41 J
TZST-0003SLOWVT: 41 J
VENTRICULAR PACING ICD: 100 pct
VF: 0

## 2012-09-06 DIAGNOSIS — Z7901 Long term (current) use of anticoagulants: Secondary | ICD-10-CM | POA: Diagnosis not present

## 2012-09-13 DIAGNOSIS — Z7901 Long term (current) use of anticoagulants: Secondary | ICD-10-CM | POA: Diagnosis not present

## 2012-10-21 ENCOUNTER — Telehealth: Payer: Self-pay | Admitting: Internal Medicine

## 2012-10-21 NOTE — Telephone Encounter (Signed)
New Problem     Pt C/O increasing numbness in feet

## 2012-10-21 NOTE — Telephone Encounter (Signed)
Spoke with patient and let her know if she continues to have problems with her feet she should make an appointment with her PCP to discuss.  She is a little worried because she has not been able to keep appointments and pick up her medications regularly due to some hardships.  I have let her know that she is due to see Dr Lovena Le in 03/2013 and should get a reminder in the mail.  If her PCP wants there to be seen for her feet then they can call us to set up.  She verbalized understanding

## 2012-10-24 ENCOUNTER — Ambulatory Visit (INDEPENDENT_AMBULATORY_CARE_PROVIDER_SITE_OTHER): Payer: Medicare Other | Admitting: *Deleted

## 2012-10-24 DIAGNOSIS — I472 Ventricular tachycardia, unspecified: Secondary | ICD-10-CM

## 2012-10-24 DIAGNOSIS — Z95810 Presence of automatic (implantable) cardiac defibrillator: Secondary | ICD-10-CM | POA: Diagnosis not present

## 2012-11-08 LAB — REMOTE ICD DEVICE
AL AMPLITUDE: 4.2 mv
AL IMPEDENCE ICD: 490 Ohm
ATRIAL PACING ICD: 0 pct
BRDY-0002RA: 40 {beats}/min
BRDY-0003RA: 120 {beats}/min
CHARGE TIME: 8.8 s
DEVICE MODEL ICD: 498164
FVT: 0
HV IMPEDENCE: 58 Ohm
LV LEAD IMPEDENCE ICD: 800 Ohm
PACEART VT: 0
RV LEAD IMPEDENCE ICD: 620 Ohm
TOT-0006: 20131031000000
TZAT-0001FASTVT: 1
TZAT-0001FASTVT: 2
TZAT-0001SLOWVT: 1
TZAT-0001SLOWVT: 2
TZAT-0002FASTVT: NEGATIVE
TZAT-0013FASTVT: 1
TZAT-0013SLOWVT: 2
TZAT-0013SLOWVT: 2
TZAT-0018FASTVT: NEGATIVE
TZAT-0018FASTVT: NEGATIVE
TZAT-0018SLOWVT: NEGATIVE
TZAT-0018SLOWVT: NEGATIVE
TZON-0003FASTVT: 300 ms
TZON-0003SLOWVT: 342.9 ms
TZST-0001FASTVT: 3
TZST-0001FASTVT: 4
TZST-0001FASTVT: 5
TZST-0001FASTVT: 6
TZST-0001FASTVT: 7
TZST-0001FASTVT: 8
TZST-0001SLOWVT: 3
TZST-0001SLOWVT: 4
TZST-0001SLOWVT: 5
TZST-0001SLOWVT: 6
TZST-0001SLOWVT: 7
TZST-0003FASTVT: 31 J
TZST-0003FASTVT: 41 J
TZST-0003FASTVT: 41 J
TZST-0003FASTVT: 41 J
TZST-0003FASTVT: 41 J
TZST-0003FASTVT: 41 J
TZST-0003SLOWVT: 31 J
TZST-0003SLOWVT: 41 J
TZST-0003SLOWVT: 41 J
TZST-0003SLOWVT: 41 J
TZST-0003SLOWVT: 41 J
VENTRICULAR PACING ICD: 100 pct
VF: 0

## 2012-11-22 DIAGNOSIS — I509 Heart failure, unspecified: Secondary | ICD-10-CM | POA: Diagnosis not present

## 2012-11-22 DIAGNOSIS — I10 Essential (primary) hypertension: Secondary | ICD-10-CM | POA: Diagnosis not present

## 2012-11-22 DIAGNOSIS — Z79899 Other long term (current) drug therapy: Secondary | ICD-10-CM | POA: Diagnosis not present

## 2012-11-22 DIAGNOSIS — Z0000 Encounter for general adult medical examination without abnormal findings: Secondary | ICD-10-CM | POA: Diagnosis not present

## 2012-11-22 DIAGNOSIS — R209 Unspecified disturbances of skin sensation: Secondary | ICD-10-CM | POA: Diagnosis not present

## 2012-11-22 DIAGNOSIS — Z7901 Long term (current) use of anticoagulants: Secondary | ICD-10-CM | POA: Diagnosis not present

## 2012-11-22 DIAGNOSIS — E7800 Pure hypercholesterolemia, unspecified: Secondary | ICD-10-CM | POA: Diagnosis not present

## 2012-11-22 DIAGNOSIS — I428 Other cardiomyopathies: Secondary | ICD-10-CM | POA: Diagnosis not present

## 2012-11-27 ENCOUNTER — Encounter: Payer: Self-pay | Admitting: *Deleted

## 2012-12-04 ENCOUNTER — Encounter: Payer: Self-pay | Admitting: Internal Medicine

## 2012-12-04 DIAGNOSIS — Z7901 Long term (current) use of anticoagulants: Secondary | ICD-10-CM | POA: Diagnosis not present

## 2012-12-13 DIAGNOSIS — Z7901 Long term (current) use of anticoagulants: Secondary | ICD-10-CM | POA: Diagnosis not present

## 2012-12-30 ENCOUNTER — Other Ambulatory Visit (HOSPITAL_COMMUNITY): Payer: Self-pay | Admitting: Family Medicine

## 2012-12-30 DIAGNOSIS — Z1231 Encounter for screening mammogram for malignant neoplasm of breast: Secondary | ICD-10-CM

## 2013-01-01 ENCOUNTER — Encounter: Payer: Self-pay | Admitting: Gastroenterology

## 2013-01-03 ENCOUNTER — Ambulatory Visit (HOSPITAL_COMMUNITY)
Admission: RE | Admit: 2013-01-03 | Discharge: 2013-01-03 | Disposition: A | Discharge status: Home / Self Care | Payer: Medicare Other | Source: Ambulatory Visit | Attending: Family Medicine | Admitting: Family Medicine

## 2013-01-03 DIAGNOSIS — Z1231 Encounter for screening mammogram for malignant neoplasm of breast: Secondary | ICD-10-CM | POA: Insufficient documentation

## 2013-01-03 DIAGNOSIS — Z79899 Other long term (current) drug therapy: Secondary | ICD-10-CM | POA: Diagnosis not present

## 2013-01-20 DIAGNOSIS — Z7901 Long term (current) use of anticoagulants: Secondary | ICD-10-CM | POA: Diagnosis not present

## 2013-01-23 ENCOUNTER — Ambulatory Visit (INDEPENDENT_AMBULATORY_CARE_PROVIDER_SITE_OTHER): Payer: Medicare Other | Admitting: *Deleted

## 2013-01-23 DIAGNOSIS — I472 Ventricular tachycardia, unspecified: Secondary | ICD-10-CM

## 2013-01-23 DIAGNOSIS — Z95810 Presence of automatic (implantable) cardiac defibrillator: Secondary | ICD-10-CM | POA: Diagnosis not present

## 2013-01-27 DIAGNOSIS — Z7901 Long term (current) use of anticoagulants: Secondary | ICD-10-CM | POA: Diagnosis not present

## 2013-01-30 ENCOUNTER — Other Ambulatory Visit: Payer: Self-pay | Admitting: Internal Medicine

## 2013-01-30 ENCOUNTER — Encounter: Payer: Self-pay | Admitting: Internal Medicine

## 2013-02-03 ENCOUNTER — Encounter: Payer: Self-pay | Admitting: Physician Assistant

## 2013-02-07 DIAGNOSIS — Z7901 Long term (current) use of anticoagulants: Secondary | ICD-10-CM | POA: Diagnosis not present

## 2013-02-13 ENCOUNTER — Ambulatory Visit: Payer: Medicare Other | Admitting: Physician Assistant

## 2013-02-17 LAB — REMOTE ICD DEVICE
AL AMPLITUDE: 8.1 mv
AL IMPEDENCE ICD: 507 Ohm
ATRIAL PACING ICD: 0 pct
BRDY-0002RA: 40 {beats}/min
BRDY-0003RA: 120 {beats}/min
CHARGE TIME: 8.9 s
DEVICE MODEL ICD: 498164
FVT: 0
HV IMPEDENCE: 62 Ohm
LV LEAD IMPEDENCE ICD: 825 Ohm
PACEART VT: 0
RV LEAD IMPEDENCE ICD: 631 Ohm
TOT-0006: 20140130000000
TZAT-0001FASTVT: 1
TZAT-0001FASTVT: 2
TZAT-0001SLOWVT: 1
TZAT-0001SLOWVT: 2
TZAT-0002FASTVT: NEGATIVE
TZAT-0013FASTVT: 1
TZAT-0013SLOWVT: 2
TZAT-0013SLOWVT: 2
TZAT-0018FASTVT: NEGATIVE
TZAT-0018FASTVT: NEGATIVE
TZAT-0018SLOWVT: NEGATIVE
TZAT-0018SLOWVT: NEGATIVE
TZON-0003FASTVT: 300 ms
TZON-0003SLOWVT: 342.9 ms
TZST-0001FASTVT: 3
TZST-0001FASTVT: 4
TZST-0001FASTVT: 5
TZST-0001FASTVT: 6
TZST-0001FASTVT: 7
TZST-0001FASTVT: 8
TZST-0001SLOWVT: 3
TZST-0001SLOWVT: 4
TZST-0001SLOWVT: 5
TZST-0001SLOWVT: 6
TZST-0001SLOWVT: 7
TZST-0003FASTVT: 31 J
TZST-0003FASTVT: 41 J
TZST-0003FASTVT: 41 J
TZST-0003FASTVT: 41 J
TZST-0003FASTVT: 41 J
TZST-0003FASTVT: 41 J
TZST-0003SLOWVT: 31 J
TZST-0003SLOWVT: 41 J
TZST-0003SLOWVT: 41 J
TZST-0003SLOWVT: 41 J
TZST-0003SLOWVT: 41 J
VENTRICULAR PACING ICD: 100 pct
VF: 0

## 2013-02-21 ENCOUNTER — Encounter: Payer: Self-pay | Admitting: *Deleted

## 2013-02-21 DIAGNOSIS — Z7901 Long term (current) use of anticoagulants: Secondary | ICD-10-CM | POA: Diagnosis not present

## 2013-02-28 ENCOUNTER — Telehealth: Payer: Self-pay | Admitting: Internal Medicine

## 2013-02-28 NOTE — Telephone Encounter (Signed)
Spoke With Pt Let Her Know We Do not Have any paper Work here that Was Dropped Off, She stated she made a Mistake and her Paperwork Is still st Her house with Her. She Will Take this To [employer] Directly and Drop Off.

## 2013-02-28 NOTE — Telephone Encounter (Signed)
Follow up  Pt is calling about a disability form she dropped off in May.  She asked if you could give her a call.

## 2013-04-02 DIAGNOSIS — Z7901 Long term (current) use of anticoagulants: Secondary | ICD-10-CM | POA: Diagnosis not present

## 2013-04-08 ENCOUNTER — Encounter: Payer: Self-pay | Admitting: Internal Medicine

## 2013-04-08 ENCOUNTER — Ambulatory Visit (INDEPENDENT_AMBULATORY_CARE_PROVIDER_SITE_OTHER): Payer: Medicare Other | Admitting: Internal Medicine

## 2013-04-08 VITALS — BP 104/65 | HR 69 | Ht 63.0 in | Wt 121.2 lb

## 2013-04-08 DIAGNOSIS — Z95810 Presence of automatic (implantable) cardiac defibrillator: Secondary | ICD-10-CM

## 2013-04-08 DIAGNOSIS — I472 Ventricular tachycardia: Secondary | ICD-10-CM

## 2013-04-08 DIAGNOSIS — I509 Heart failure, unspecified: Secondary | ICD-10-CM

## 2013-04-08 LAB — ICD DEVICE OBSERVATION
AL AMPLITUDE: 7 mv
AL IMPEDENCE ICD: 489 Ohm
AL THRESHOLD: 1 V
ATRIAL PACING ICD: 1 pct
DEVICE MODEL ICD: 498164
HV IMPEDENCE: 56 Ohm
LV LEAD AMPLITUDE: 21.9 mv
LV LEAD IMPEDENCE ICD: 823 Ohm
LV LEAD THRESHOLD: 1.2 V
RV LEAD AMPLITUDE: 14.9 mv
RV LEAD IMPEDENCE ICD: 603 Ohm
RV LEAD THRESHOLD: 0.9 V
TZAT-0001FASTVT: 1
TZAT-0001FASTVT: 2
TZAT-0001SLOWVT: 1
TZAT-0001SLOWVT: 2
TZAT-0002FASTVT: NEGATIVE
TZAT-0013FASTVT: 1
TZAT-0013SLOWVT: 2
TZAT-0013SLOWVT: 2
TZAT-0018FASTVT: NEGATIVE
TZAT-0018FASTVT: NEGATIVE
TZAT-0018SLOWVT: NEGATIVE
TZAT-0018SLOWVT: NEGATIVE
TZON-0003FASTVT: 300 ms
TZON-0003SLOWVT: 342.9 ms
TZST-0001FASTVT: 3
TZST-0001FASTVT: 4
TZST-0001FASTVT: 5
TZST-0001FASTVT: 6
TZST-0001FASTVT: 7
TZST-0001FASTVT: 8
TZST-0001SLOWVT: 3
TZST-0001SLOWVT: 4
TZST-0001SLOWVT: 5
TZST-0001SLOWVT: 6
TZST-0001SLOWVT: 7
TZST-0003FASTVT: 31 J
TZST-0003FASTVT: 41 J
TZST-0003FASTVT: 41 J
TZST-0003FASTVT: 41 J
TZST-0003FASTVT: 41 J
TZST-0003FASTVT: 41 J
TZST-0003SLOWVT: 31 J
TZST-0003SLOWVT: 41 J
TZST-0003SLOWVT: 41 J
TZST-0003SLOWVT: 41 J
TZST-0003SLOWVT: 41 J
VENTRICULAR PACING ICD: 100 pct

## 2013-04-08 NOTE — Assessment & Plan Note (Signed)
Her device is working normally.no intercurrent ICD shocks. Appropriate pacing and sensing thresholds. Estimated battery longevity 8 years

## 2013-04-08 NOTE — Progress Notes (Signed)
HPI [name] returns today for followup. She is a very pleasant 57 year old woman with a history of chronic systolic heart failure and left bundle branch block, as well as dyslipidemia. She is status post biventricular ICD implantation. In the interim, she has done well. She has rare palpitations, denies chest pain, shortness of breath, or peripheral edema. She notes occasional tingling in her lower extremities. This is worse at night. No Known Allergies   Current Outpatient Prescriptions  Medication Sig Dispense Refill  . carvedilol (COREG) 25 MG tablet Take 25 mg by mouth 2 (two) times daily with a meal. Take 1/2 tab twice a day      . furosemide (LASIX) 40 MG tablet Take 40 mg by mouth. Take one tablet every other day      . isosorbide-hydrALAZINE (BIDIL) 20-37.5 MG per tablet Take 1 tablet by mouth 2 (two) times daily.      . Multiple Vitamin (MULTIVITAMIN) capsule Take 1 capsule by mouth daily.        Marland Kitchen olmesartan (BENICAR) 20 MG tablet Take 10 mg by mouth daily.        . rosuvastatin (CRESTOR) 10 MG tablet Take 1 tablet (10 mg total) by mouth daily.  90 tablet  3  . spironolactone (ALDACTONE) 25 MG tablet Take 1 tablet (25 mg total) by mouth daily.  90 tablet  3  . warfarin (COUMADIN) 5 MG tablet Use as directed        No current facility-administered medications for this visit.     Past Medical History  Diagnosis Date  . Aortic stenosis   . Cardiomyopathy secondary   . Hyperlipidemia   . HTN (hypertension)   . Bicuspid aortic valve   . Abnormal liver function tests   . CHF (congestive heart failure)   . Cholecystitis     ROS:   All systems reviewed and negative except as noted in the HPI.   Past Surgical History  Procedure Laterality Date  . Laparoscopic cholecystectomy with intraoperative cholangiogram    . Implantation of a guidant biventricular icd       Family History  Problem Relation Age of Onset  . Pancreatic cancer Mother   . Colon cancer Maternal Aunt    . Bone cancer Brother      History   Social History  . Marital Status: Single    Spouse Name: N/A    Number of Children: N/A  . 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 Smoking status: Former Smoker    Quit date: 09/25/2010  . Smokeless tobacco: Not on file     Comment: none in last 15 days  . Alcohol Use: Yes     Comment: occasional  . Drug Use: Not on file  . Sexually Active: Not on file   Other Topics Concern  . Not on file   Social History Narrative  . No narrative on file     BP 104/65  Pulse 69  Ht 5\' 3"  (1.6 m)  Wt 121 lb 3.2 oz (54.976 kg)  BMI 21.48 kg/m2  Physical Exam:  Well appearing middle aged woman, NAD HEENT: Unremarkable Neck: 7 cm  JVD, no thyromegally Lungs:  Clear with no wheezes, rales, or rhonchi. HEART:  Regular rate rhythm, no murmurs, no rubs, no clicks Abd:  soft, positive bowel sounds, no organomegally, no rebound, no guarding Ext:  2 plus pulses, no edema, no cyanosis, no  clubbing Skin:  No rashes no nodules Neuro:  CN II through XII intact, motor grossly intact  ECG - normal sinus rhythm with biventricular pacing  DEVICE  Normal device function.  See PaceArt for details.   Assess/Plan:

## 2013-04-08 NOTE — Assessment & Plan Note (Signed)
She has had no sustained ventricular arrhythmias. Interrogation of her [place] demonstrates several nonsustained episodes only

## 2013-04-08 NOTE — Assessment & Plan Note (Signed)
Her chronic systolic heart failure is currently class IIA. She will continue her current medical therapy, and maintain a low-sodium diet.

## 2013-04-08 NOTE — Patient Instructions (Addendum)
Your physician wants you to follow-up in: 12 months with Dr Knox Saliva will receive a reminder letter in the mail two months in advance. If you don't receive a letter, please call our office to schedule the follow-up appointment.   Remote monitoring is used to monitor your Pacemaker or ICD from home. This monitoring reduces the number of office visits required to check your device to one time per year. It allows [place] to keep an eye on the functioning of your device to ensure it is working properly. You are scheduled for a device check from home on 07/14/13. You may send your transmission at any time that day. If you have a wireless device, the transmission will be sent automatically. After your physician reviews your transmission, you will receive a postcard with your next transmission date.

## 2013-04-28 ENCOUNTER — Telehealth: Payer: Self-pay | Admitting: Internal Medicine

## 2013-04-28 NOTE — Telephone Encounter (Signed)
Follow Up    [name] calling over following up on disability forms that should have been faxed to him. Please call.

## 2013-05-14 ENCOUNTER — Other Ambulatory Visit: Payer: Self-pay | Admitting: Cardiology

## 2013-05-14 DIAGNOSIS — I509 Heart failure, unspecified: Secondary | ICD-10-CM

## 2013-05-14 MED ORDER — ISOSORB DINITRATE-HYDRALAZINE 20-37.5 MG PO TABS: 1.0000 | ORAL_TABLET | Freq: Two times a day (BID) | ORAL | Status: DC

## 2013-05-15 ENCOUNTER — Other Ambulatory Visit: Payer: Self-pay | Admitting: *Deleted

## 2013-05-15 DIAGNOSIS — I509 Heart failure, unspecified: Secondary | ICD-10-CM

## 2013-05-15 DIAGNOSIS — Z7901 Long term (current) use of anticoagulants: Secondary | ICD-10-CM | POA: Diagnosis not present

## 2013-05-15 MED ORDER — ISOSORB DINITRATE-HYDRALAZINE 20-37.5 MG PO TABS: 1.0000 | ORAL_TABLET | Freq: Two times a day (BID) | ORAL | Status: DC

## 2013-05-15 MED ORDER — FUROSEMIDE 40 MG PO TABS: 40.0000 mg | ORAL_TABLET | ORAL | Status: DC

## 2013-05-15 NOTE — Telephone Encounter (Deleted)
error 

## 2013-05-22 DIAGNOSIS — Z7901 Long term (current) use of anticoagulants: Secondary | ICD-10-CM | POA: Diagnosis not present

## 2013-05-22 DIAGNOSIS — R209 Unspecified disturbances of skin sensation: Secondary | ICD-10-CM | POA: Diagnosis not present

## 2013-06-04 ENCOUNTER — Other Ambulatory Visit: Payer: Self-pay

## 2013-06-04 MED ORDER — SPIRONOLACTONE 25 MG PO TABS: 25.0000 mg | ORAL_TABLET | Freq: Every day | ORAL | Status: DC

## 2013-06-05 DIAGNOSIS — Z7901 Long term (current) use of anticoagulants: Secondary | ICD-10-CM | POA: Diagnosis not present

## 2013-06-17 ENCOUNTER — Ambulatory Visit (INDEPENDENT_AMBULATORY_CARE_PROVIDER_SITE_OTHER): Payer: Medicare Other | Admitting: Physician Assistant

## 2013-06-17 ENCOUNTER — Encounter: Payer: Self-pay | Admitting: Physician Assistant

## 2013-06-17 VITALS — BP 136/70 | HR 70 | Ht 64.0 in | Wt 123.0 lb

## 2013-06-17 DIAGNOSIS — I255 Ischemic cardiomyopathy: Secondary | ICD-10-CM

## 2013-06-17 DIAGNOSIS — Z7901 Long term (current) use of anticoagulants: Secondary | ICD-10-CM | POA: Diagnosis not present

## 2013-06-17 DIAGNOSIS — Z86010 Personal history of colonic polyps: Secondary | ICD-10-CM | POA: Diagnosis not present

## 2013-06-17 DIAGNOSIS — I2589 Other forms of chronic ischemic heart disease: Secondary | ICD-10-CM | POA: Diagnosis not present

## 2013-06-17 NOTE — Patient Instructions (Addendum)
Make follow up appointment as needed.

## 2013-06-17 NOTE — Progress Notes (Signed)
Subjective:    Patient ID: [name], female    DOB: 15-Oct-1955, 57 y.o.   MRN: [id number]  HPI  [name] is a very nice 57 year old Afro-American female known to Dr. Fuller Plan who comes in today to discuss followup colonoscopy. She initially had colonoscopy in 1999 and had one to 2 tubular adenomas found and removed. She had followup colonoscopy in 2002 with 24 mm polyps removed from the left colon path on these consistent with hyperplastic polyps. Last colonoscopy 2009 with no polyps found, she does have internal hemorrhoids. Patient has significant comorbidities with history of chronic systolic heart failure and cardiomyopathy though last ease EF was measured at 65% in July of 2013. She also has a history of nonsustained V. tach and is status post ICD placement. She has a left bundle-branch block and aortic stenosis as well as hypertension and hyperlipidemia. She is maintained on chronic Coumadin. She currently has no GI symptoms, specifically denies any abdominal pain changes in her bowel habits melena or hematochezia. Family history is positive for one maternal aunt with colon cancer, no First-degree relatives with colon cancers. She expresses concerns today about fear of having a stroke, sedation and coming off of her Coumadin.    Review of Systems  Constitutional: Negative.   HENT: Negative.   Eyes: Negative.   Respiratory: Negative.   Cardiovascular: Negative.   Gastrointestinal: Negative.   Endocrine: Negative.   Genitourinary: Negative.   Musculoskeletal: Negative.   Skin: Negative.   Allergic/Immunologic: Negative.   Neurological: Positive for numbness.  Hematological: Negative.   Psychiatric/Behavioral: Negative.    Outpatient Prescriptions Prior to Visit  Medication Sig Dispense Refill  . carvedilol (COREG) 25 MG tablet Take 25 mg by mouth 2 (two) times daily with a meal. Take 1/2 tab twice a day      . furosemide (LASIX) 40 MG tablet Take 1 tablet (40 mg total) by mouth  every other day. Take one tablet every other day  15 tablet  5  . furosemide (LASIX) 40 MG tablet Take 1 tablet (40 mg total) by mouth every other day.  15 tablet  5  . isosorbide-hydrALAZINE (BIDIL) 20-37.5 MG per tablet Take 1 tablet by mouth 2 (two) times daily.  60 tablet  5  . Multiple Vitamin (MULTIVITAMIN) capsule Take 1 capsule by mouth daily.        Marland Kitchen olmesartan (BENICAR) 20 MG tablet Take 10 mg by mouth daily.        . rosuvastatin (CRESTOR) 10 MG tablet Take 1 tablet (10 mg total) by mouth daily.  90 tablet  3  . spironolactone (ALDACTONE) 25 MG tablet Take 1 tablet (25 mg total) by mouth daily.  90 tablet  3  . warfarin (COUMADIN) 5 MG tablet Use as directed        No facility-administered medications prior to visit.   No Known Allergies Patient Active Problem List   Diagnosis Date Noted  . Ventricular tachycardia 02/21/2011  . AUTOMATIC IMPLANTABLE CARDIAC DEFIBRILLATOR SITU 06/02/2009  . HYPERLIPIDEMIA 05/29/2009  . SMOKER 05/29/2009  . HYPERTENSION 05/29/2009  . AORTIC STENOSIS 05/29/2009  . CARDIOMYOPATHY, SECONDARY 05/29/2009  . CHF 05/29/2009  . BICUSPID AORTIC VALVE 05/29/2009  . LIVER FUNCTION TESTS, ABNORMAL, HX OF 05/29/2009  . CHOLECYSTITIS, HX OF 05/29/2009   History  Substance Use Topics  . Smoking status: Former Smoker    Quit date: 09/25/2010  . Smokeless tobacco: Never Used     Comment: none in last 15 days  .  Alcohol Use: Yes     Comment: occasional   family history includes Bone cancer in her brother; Colon cancer in her maternal aunt; Pancreatic cancer in her mother.     Objective:   Physical Exam   Female in no acute distress, pleasant blood pressure 136/70 pulse 70 height 5 foot 4 weight 123. HEENT; nontraumatic normocephalic EOMI PERRLA sclera anicteric, Supple; no JVD, Cardiovascular; regular rate and rhythm with S1-S2 she has a 4/6 systolic murmur and has a defibrillator in the left chest wall. Lungs; clear bilaterally, Abdomen; soft  nontender nondistended bowel sounds are active there is no palpable mass or hepatosplenomegaly, Rectal; exam not done, Extremities; no clubbing cyanosis or edema skin warm and dry, Psych; mood and affect normal and appropriate.       Assessment & Plan:  #7  57 year old African American female with history of adenomatous colon polyps on initial exam it 1999, with no recurrent adenomatous polyps on 2 serial followup exams and no polyps found on last colonoscopy 2009.. Current AGA guidelines states that she is in a low risk category and can be followed at a 10 year interval #2 Significant comorbidities including chronic congestive heart failure, cardiomyopathy, history of nonsustained V. tach and prior placement of an ICD. #3 chronic anticoagulation with Coumadin #4 aortic stenosis Plan; discussed with patient in detail, regarding current guidelines. Do not feel that she needs followup colonoscopy at this time and will put in for recall in 5 years. She is aware that we will be happy to see her in the interim for any problems. She is accepting of this plan.

## 2013-06-17 NOTE — Progress Notes (Signed)
Reviewed and agree with management plan. I discussed the plans with Nicoletta Ba, PAC during the office visit.  Pricilla Riffle. Fuller Plan, MD [facility]

## 2013-06-26 DIAGNOSIS — I10 Essential (primary) hypertension: Secondary | ICD-10-CM | POA: Diagnosis not present

## 2013-06-26 DIAGNOSIS — Z7901 Long term (current) use of anticoagulants: Secondary | ICD-10-CM | POA: Diagnosis not present

## 2013-06-26 DIAGNOSIS — R209 Unspecified disturbances of skin sensation: Secondary | ICD-10-CM | POA: Diagnosis not present

## 2013-06-26 DIAGNOSIS — Z23 Encounter for immunization: Secondary | ICD-10-CM | POA: Diagnosis not present

## 2013-07-09 ENCOUNTER — Encounter: Payer: Self-pay | Admitting: Internal Medicine

## 2013-07-10 ENCOUNTER — Ambulatory Visit (INDEPENDENT_AMBULATORY_CARE_PROVIDER_SITE_OTHER): Payer: Medicare Other | Admitting: *Deleted

## 2013-07-10 DIAGNOSIS — Z95810 Presence of automatic (implantable) cardiac defibrillator: Secondary | ICD-10-CM | POA: Diagnosis not present

## 2013-07-10 DIAGNOSIS — I472 Ventricular tachycardia: Secondary | ICD-10-CM

## 2013-07-10 DIAGNOSIS — I429 Cardiomyopathy, unspecified: Secondary | ICD-10-CM

## 2013-07-14 LAB — REMOTE ICD DEVICE
AL AMPLITUDE: 3.9 mv
AL IMPEDENCE ICD: 471 Ohm
ATRIAL PACING ICD: 100 pct
BRDY-0002RA: 40 {beats}/min
BRDY-0003RA: 120 {beats}/min
DEVICE MODEL ICD: 498164
HV IMPEDENCE: 51 Ohm
LV LEAD IMPEDENCE ICD: 875 Ohm
RV LEAD IMPEDENCE ICD: 566 Ohm
TZAT-0001FASTVT: 1
TZAT-0001FASTVT: 2
TZAT-0001SLOWVT: 1
TZAT-0001SLOWVT: 2
TZAT-0002FASTVT: NEGATIVE
TZAT-0013FASTVT: 1
TZAT-0013SLOWVT: 2
TZAT-0013SLOWVT: 2
TZAT-0018FASTVT: NEGATIVE
TZAT-0018FASTVT: NEGATIVE
TZAT-0018SLOWVT: NEGATIVE
TZAT-0018SLOWVT: NEGATIVE
TZON-0003FASTVT: 300 ms
TZON-0003SLOWVT: 342.9 ms
TZST-0001FASTVT: 3
TZST-0001FASTVT: 4
TZST-0001FASTVT: 5
TZST-0001FASTVT: 6
TZST-0001FASTVT: 7
TZST-0001FASTVT: 8
TZST-0001SLOWVT: 3
TZST-0001SLOWVT: 4
TZST-0001SLOWVT: 5
TZST-0001SLOWVT: 6
TZST-0001SLOWVT: 7
TZST-0003FASTVT: 31 J
TZST-0003FASTVT: 41 J
TZST-0003FASTVT: 41 J
TZST-0003FASTVT: 41 J
TZST-0003FASTVT: 41 J
TZST-0003FASTVT: 41 J
TZST-0003SLOWVT: 31 J
TZST-0003SLOWVT: 41 J
TZST-0003SLOWVT: 41 J
TZST-0003SLOWVT: 41 J
TZST-0003SLOWVT: 41 J
VENTRICULAR PACING ICD: 100 pct

## 2013-07-30 DIAGNOSIS — Z7901 Long term (current) use of anticoagulants: Secondary | ICD-10-CM | POA: Diagnosis not present

## 2013-07-30 DIAGNOSIS — I509 Heart failure, unspecified: Secondary | ICD-10-CM | POA: Diagnosis not present

## 2013-08-08 DIAGNOSIS — Z7901 Long term (current) use of anticoagulants: Secondary | ICD-10-CM | POA: Diagnosis not present

## 2013-08-08 DIAGNOSIS — I509 Heart failure, unspecified: Secondary | ICD-10-CM | POA: Diagnosis not present

## 2013-08-20 DIAGNOSIS — Z7901 Long term (current) use of anticoagulants: Secondary | ICD-10-CM | POA: Diagnosis not present

## 2013-08-20 DIAGNOSIS — I509 Heart failure, unspecified: Secondary | ICD-10-CM | POA: Diagnosis not present

## 2013-09-05 DIAGNOSIS — Z7901 Long term (current) use of anticoagulants: Secondary | ICD-10-CM | POA: Diagnosis not present

## 2013-09-05 DIAGNOSIS — I509 Heart failure, unspecified: Secondary | ICD-10-CM | POA: Diagnosis not present

## 2013-09-09 ENCOUNTER — Telehealth: Payer: Self-pay | Admitting: Internal Medicine

## 2013-09-09 ENCOUNTER — Other Ambulatory Visit: Payer: Self-pay | Admitting: *Deleted

## 2013-09-09 DIAGNOSIS — I509 Heart failure, unspecified: Secondary | ICD-10-CM

## 2013-09-09 MED ORDER — ISOSORB DINITRATE-HYDRALAZINE 20-37.5 MG PO TABS: 1.0000 | ORAL_TABLET | Freq: Two times a day (BID) | ORAL | Status: DC

## 2013-09-09 NOTE — Telephone Encounter (Signed)
New problem    Pt called about her BIDIL her pharm ( [facility]) is out.    Pt asked if this office can call it in to [facility] please.  If any questions please give her a call.

## 2013-09-15 DIAGNOSIS — I509 Heart failure, unspecified: Secondary | ICD-10-CM | POA: Diagnosis not present

## 2013-09-15 DIAGNOSIS — Z7901 Long term (current) use of anticoagulants: Secondary | ICD-10-CM | POA: Diagnosis not present

## 2013-10-02 DIAGNOSIS — Z7901 Long term (current) use of anticoagulants: Secondary | ICD-10-CM | POA: Diagnosis not present

## 2013-10-02 DIAGNOSIS — I509 Heart failure, unspecified: Secondary | ICD-10-CM | POA: Diagnosis not present

## 2013-10-09 ENCOUNTER — Encounter: Payer: Self-pay | Admitting: Internal Medicine

## 2013-10-09 ENCOUNTER — Encounter: Payer: Medicare Other | Admitting: *Deleted

## 2013-10-09 DIAGNOSIS — I472 Ventricular tachycardia, unspecified: Secondary | ICD-10-CM

## 2013-10-09 DIAGNOSIS — I509 Heart failure, unspecified: Secondary | ICD-10-CM

## 2013-10-09 DIAGNOSIS — Z95810 Presence of automatic (implantable) cardiac defibrillator: Secondary | ICD-10-CM

## 2013-10-09 LAB — MDC_IDC_ENUM_SESS_TYPE_REMOTE
Brady Statistic RA Percent Paced: 0 %
HighPow Impedance: 52 Ohm
Implantable Pulse Generator Serial Number: 498164
Lead Channel Impedance Value: 1026 Ohm
Lead Channel Impedance Value: 473 Ohm
Lead Channel Impedance Value: 575 Ohm
Lead Channel Sensing Intrinsic Amplitude: 7.7 mV
Lead Channel Setting Pacing Amplitude: 2 V
Lead Channel Setting Pacing Amplitude: 2.2 V
Lead Channel Setting Pacing Amplitude: 2.4 V
Lead Channel Setting Pacing Pulse Width: 0.4 ms
Lead Channel Setting Pacing Pulse Width: 0.4 ms
Zone Setting Detection Interval: 240 ms
Zone Setting Detection Interval: 300 ms
Zone Setting Detection Interval: 342.9 ms

## 2013-10-21 ENCOUNTER — Encounter: Payer: Self-pay | Admitting: *Deleted

## 2013-10-31 DIAGNOSIS — I509 Heart failure, unspecified: Secondary | ICD-10-CM | POA: Diagnosis not present

## 2013-10-31 DIAGNOSIS — Z7901 Long term (current) use of anticoagulants: Secondary | ICD-10-CM | POA: Diagnosis not present

## 2013-11-25 DIAGNOSIS — I10 Essential (primary) hypertension: Secondary | ICD-10-CM | POA: Diagnosis not present

## 2013-11-25 DIAGNOSIS — Z0000 Encounter for general adult medical examination without abnormal findings: Secondary | ICD-10-CM | POA: Diagnosis not present

## 2013-11-25 DIAGNOSIS — L821 Other seborrheic keratosis: Secondary | ICD-10-CM | POA: Diagnosis not present

## 2013-11-25 DIAGNOSIS — Z7901 Long term (current) use of anticoagulants: Secondary | ICD-10-CM | POA: Diagnosis not present

## 2013-11-25 DIAGNOSIS — E7800 Pure hypercholesterolemia, unspecified: Secondary | ICD-10-CM | POA: Diagnosis not present

## 2013-11-25 DIAGNOSIS — I509 Heart failure, unspecified: Secondary | ICD-10-CM | POA: Diagnosis not present

## 2013-11-25 DIAGNOSIS — I428 Other cardiomyopathies: Secondary | ICD-10-CM | POA: Diagnosis not present

## 2013-11-27 ENCOUNTER — Other Ambulatory Visit: Payer: Self-pay

## 2013-11-27 DIAGNOSIS — I509 Heart failure, unspecified: Secondary | ICD-10-CM

## 2013-11-27 MED ORDER — FUROSEMIDE 40 MG PO TABS: 40.0000 mg | ORAL_TABLET | ORAL | Status: DC

## 2013-12-03 DIAGNOSIS — Z7901 Long term (current) use of anticoagulants: Secondary | ICD-10-CM | POA: Diagnosis not present

## 2013-12-03 DIAGNOSIS — I509 Heart failure, unspecified: Secondary | ICD-10-CM | POA: Diagnosis not present

## 2013-12-25 DIAGNOSIS — Z7901 Long term (current) use of anticoagulants: Secondary | ICD-10-CM | POA: Diagnosis not present

## 2013-12-25 DIAGNOSIS — I509 Heart failure, unspecified: Secondary | ICD-10-CM | POA: Diagnosis not present

## 2014-01-02 ENCOUNTER — Other Ambulatory Visit (HOSPITAL_COMMUNITY): Payer: Self-pay | Admitting: Family Medicine

## 2014-01-02 DIAGNOSIS — Z1231 Encounter for screening mammogram for malignant neoplasm of breast: Secondary | ICD-10-CM

## 2014-01-05 DIAGNOSIS — H11159 Pinguecula, unspecified eye: Secondary | ICD-10-CM | POA: Diagnosis not present

## 2014-01-05 DIAGNOSIS — H04129 Dry eye syndrome of unspecified lacrimal gland: Secondary | ICD-10-CM | POA: Diagnosis not present

## 2014-01-05 DIAGNOSIS — H43399 Other vitreous opacities, unspecified eye: Secondary | ICD-10-CM | POA: Diagnosis not present

## 2014-01-06 ENCOUNTER — Ambulatory Visit (HOSPITAL_COMMUNITY)
Admission: RE | Admit: 2014-01-06 | Discharge: 2014-01-06 | Disposition: A | Discharge status: Home / Self Care | Payer: Medicare Other | Source: Ambulatory Visit | Attending: Family Medicine | Admitting: Family Medicine

## 2014-01-06 DIAGNOSIS — Z1231 Encounter for screening mammogram for malignant neoplasm of breast: Secondary | ICD-10-CM | POA: Diagnosis not present

## 2014-01-15 ENCOUNTER — Encounter: Payer: Self-pay | Admitting: Internal Medicine

## 2014-01-15 ENCOUNTER — Ambulatory Visit (INDEPENDENT_AMBULATORY_CARE_PROVIDER_SITE_OTHER): Payer: Medicare Other | Admitting: *Deleted

## 2014-01-15 ENCOUNTER — Telehealth: Payer: Self-pay | Admitting: Internal Medicine

## 2014-01-15 DIAGNOSIS — I429 Cardiomyopathy, unspecified: Secondary | ICD-10-CM | POA: Diagnosis not present

## 2014-01-15 DIAGNOSIS — I509 Heart failure, unspecified: Secondary | ICD-10-CM

## 2014-01-15 DIAGNOSIS — Z7901 Long term (current) use of anticoagulants: Secondary | ICD-10-CM | POA: Diagnosis not present

## 2014-01-15 LAB — MDC_IDC_ENUM_SESS_TYPE_REMOTE
Battery Remaining Longevity: 96 mo
Brady Statistic RA Percent Paced: 0 %
Brady Statistic RV Percent Paced: 100 %
Implantable Pulse Generator Serial Number: 498164
Lead Channel Impedance Value: 1061 Ohm
Lead Channel Impedance Value: 472 Ohm
Lead Channel Impedance Value: 591 Ohm
Lead Channel Sensing Intrinsic Amplitude: 4.1 mV
Lead Channel Setting Pacing Amplitude: 2 V
Lead Channel Setting Pacing Amplitude: 2.2 V
Lead Channel Setting Pacing Amplitude: 2.4 V
Lead Channel Setting Pacing Pulse Width: 0.4 ms
Lead Channel Setting Pacing Pulse Width: 0.4 ms
Zone Setting Detection Interval: 240 ms
Zone Setting Detection Interval: 300 ms
Zone Setting Detection Interval: 342.9 ms

## 2014-01-15 MED ORDER — ISOSORB DINITRATE-HYDRALAZINE 20-37.5 MG PO TABS: 1.0000 | ORAL_TABLET | Freq: Two times a day (BID) | ORAL | Status: DC

## 2014-01-15 NOTE — Telephone Encounter (Signed)
New Prob    Pt requesting a call regarding a prescription. Please call.

## 2014-01-15 NOTE — Telephone Encounter (Signed)
Gets her Bi-dil through the [facility] on [address] in the [employer] building

## 2014-01-26 NOTE — Progress Notes (Signed)
ICD remote 

## 2014-01-27 ENCOUNTER — Encounter: Payer: Self-pay | Admitting: *Deleted

## 2014-02-09 DIAGNOSIS — I509 Heart failure, unspecified: Secondary | ICD-10-CM | POA: Diagnosis not present

## 2014-02-09 DIAGNOSIS — Z7901 Long term (current) use of anticoagulants: Secondary | ICD-10-CM | POA: Diagnosis not present

## 2014-02-09 DIAGNOSIS — I10 Essential (primary) hypertension: Secondary | ICD-10-CM | POA: Diagnosis not present

## 2014-02-09 DIAGNOSIS — Z23 Encounter for immunization: Secondary | ICD-10-CM | POA: Diagnosis not present

## 2014-02-09 DIAGNOSIS — R51 Headache: Secondary | ICD-10-CM | POA: Diagnosis not present

## 2014-02-09 DIAGNOSIS — I428 Other cardiomyopathies: Secondary | ICD-10-CM | POA: Diagnosis not present

## 2014-02-09 DIAGNOSIS — L84 Corns and callosities: Secondary | ICD-10-CM | POA: Diagnosis not present

## 2014-03-19 ENCOUNTER — Telehealth: Payer: Self-pay | Admitting: Internal Medicine

## 2014-03-19 NOTE — Telephone Encounter (Signed)
error 

## 2014-03-20 DIAGNOSIS — I509 Heart failure, unspecified: Secondary | ICD-10-CM | POA: Diagnosis not present

## 2014-03-20 DIAGNOSIS — Z7901 Long term (current) use of anticoagulants: Secondary | ICD-10-CM | POA: Diagnosis not present

## 2014-03-20 DIAGNOSIS — B029 Zoster without complications: Secondary | ICD-10-CM | POA: Diagnosis not present

## 2014-04-17 DIAGNOSIS — Z7901 Long term (current) use of anticoagulants: Secondary | ICD-10-CM | POA: Diagnosis not present

## 2014-04-17 DIAGNOSIS — I509 Heart failure, unspecified: Secondary | ICD-10-CM | POA: Diagnosis not present

## 2014-04-27 DIAGNOSIS — Z7901 Long term (current) use of anticoagulants: Secondary | ICD-10-CM | POA: Diagnosis not present

## 2014-04-27 DIAGNOSIS — I509 Heart failure, unspecified: Secondary | ICD-10-CM | POA: Diagnosis not present

## 2014-05-06 DIAGNOSIS — Z7901 Long term (current) use of anticoagulants: Secondary | ICD-10-CM | POA: Diagnosis not present

## 2014-05-06 DIAGNOSIS — I509 Heart failure, unspecified: Secondary | ICD-10-CM | POA: Diagnosis not present

## 2014-05-07 ENCOUNTER — Ambulatory Visit (INDEPENDENT_AMBULATORY_CARE_PROVIDER_SITE_OTHER): Payer: Medicare Other | Admitting: Internal Medicine

## 2014-05-07 ENCOUNTER — Ambulatory Visit (HOSPITAL_COMMUNITY): Payer: Medicare Other | Attending: Internal Medicine | Admitting: Radiology

## 2014-05-07 ENCOUNTER — Encounter: Payer: Self-pay | Admitting: Internal Medicine

## 2014-05-07 VITALS — BP 85/52 | HR 81 | Ht 64.0 in | Wt 117.1 lb

## 2014-05-07 DIAGNOSIS — I5022 Chronic systolic (congestive) heart failure: Secondary | ICD-10-CM

## 2014-05-07 DIAGNOSIS — I359 Nonrheumatic aortic valve disorder, unspecified: Secondary | ICD-10-CM | POA: Diagnosis not present

## 2014-05-07 DIAGNOSIS — I472 Ventricular tachycardia, unspecified: Secondary | ICD-10-CM | POA: Insufficient documentation

## 2014-05-07 DIAGNOSIS — I447 Left bundle-branch block, unspecified: Secondary | ICD-10-CM | POA: Diagnosis not present

## 2014-05-07 DIAGNOSIS — I4729 Other ventricular tachycardia: Secondary | ICD-10-CM | POA: Diagnosis not present

## 2014-05-07 DIAGNOSIS — Z95810 Presence of automatic (implantable) cardiac defibrillator: Secondary | ICD-10-CM

## 2014-05-07 DIAGNOSIS — E785 Hyperlipidemia, unspecified: Secondary | ICD-10-CM | POA: Diagnosis not present

## 2014-05-07 DIAGNOSIS — I059 Rheumatic mitral valve disease, unspecified: Secondary | ICD-10-CM | POA: Insufficient documentation

## 2014-05-07 DIAGNOSIS — Z87891 Personal history of nicotine dependence: Secondary | ICD-10-CM | POA: Insufficient documentation

## 2014-05-07 DIAGNOSIS — I10 Essential (primary) hypertension: Secondary | ICD-10-CM | POA: Insufficient documentation

## 2014-05-07 DIAGNOSIS — I5032 Chronic diastolic (congestive) heart failure: Secondary | ICD-10-CM | POA: Insufficient documentation

## 2014-05-07 NOTE — Progress Notes (Signed)
Echocardiogram performed.  

## 2014-05-07 NOTE — Assessment & Plan Note (Signed)
Her biventricular ICD ([place]) is working normally. Will recheck in several months.

## 2014-05-07 NOTE — Assessment & Plan Note (Signed)
She has minimal symptoms. She has a fairly worrisome murmur. I will ask her to repeat her echo as she has been 2 years since she has gotten an echo.

## 2014-05-07 NOTE — Assessment & Plan Note (Signed)
She has had normalization of her LV function by echo 2 years ago. She is now class 1. She will continue her current meds.

## 2014-05-07 NOTE — Patient Instructions (Signed)
Your physician recommends that you continue on your current medications as directed. Please refer to the Current Medication list given to you today.  Your physician has requested that you have an echocardiogram. Echocardiography is a painless test that uses sound waves to create images of your heart. It provides your doctor with information about the size and shape of your heart and how well your heart's chambers and valves are working. This procedure takes approximately one hour. There are no restrictions for this procedure.  Your physician wants you to follow-up in: 1 year with  You will receive a reminder letter in the mail two months in advance. If you don't receive a letter, please call our office to schedule the follow-up appointment.

## 2014-05-07 NOTE — Progress Notes (Signed)
HPI [name] returns today for followup. She is a very pleasant 58 year old woman with a history of chronic systolic heart failure and left bundle branch block, as well as dyslipidemia. She is status post biventricular ICD implantation. In the interim, she has done well. She has a h/o aortic stenosis but has remained asymptomatic. She has rare palpitations, denies chest pain, shortness of breath, or peripheral edema.  No Known Allergies   Current Outpatient Prescriptions  Medication Sig Dispense Refill  . carvedilol (COREG) 25 MG tablet Take 25 mg by mouth 2 (two) times daily with a meal. Take 1/2 tab twice a day      . furosemide (LASIX) 40 MG tablet Take 1 tablet (40 mg total) by mouth every other day. Take one tablet every other day  15 tablet  5  . isosorbide-hydrALAZINE (BIDIL) 20-37.5 MG per tablet Take 1 tablet by mouth 2 (two) times daily.  180 tablet  3  . Multiple Vitamin (MULTIVITAMIN) capsule Take 1 capsule by mouth daily.        Marland Kitchen olmesartan (BENICAR) 20 MG tablet Take 10 mg by mouth daily.        . rosuvastatin (CRESTOR) 10 MG tablet Take 1 tablet (10 mg total) by mouth daily.  90 tablet  3  . spironolactone (ALDACTONE) 25 MG tablet Take 1 tablet (25 mg total) by mouth daily.  90 tablet  3  . warfarin (COUMADIN) 5 MG tablet Use as directed        No current facility-administered medications for this visit.     Past Medical History  Diagnosis Date  . Aortic stenosis   . Cardiomyopathy secondary   . Hyperlipidemia   . HTN (hypertension)   . Bicuspid aortic valve   . Abnormal liver function tests   . CHF (congestive heart failure)   . Benign neoplasm of colon 04/19/2001    Hyperplastic  . S/P ICD (internal cardiac defibrillator) procedure     ROS:   All systems reviewed and negative except as noted in the HPI.   Past Surgical History  Procedure Laterality Date  . Laparoscopic cholecystectomy with intraoperative cholangiogram    . Implantation of a guidant  biventricular icd       Family History  Problem Relation Age of Onset  . Pancreatic cancer Mother   . Colon cancer Maternal Aunt   . Bone cancer Brother      History   Social History  . Marital Status: Single    Spouse Name: N/A    Number of Children: 1  . Years of Education: N/A   Occupational History  . Therapist, art   .     Social History Main Topics  . Smoking status: Former Smoker    Quit date: 09/25/2010  . Smokeless tobacco: Never Used     Comment: none in last 15 days  . Alcohol Use: Yes     Comment: occasional  . Drug Use: No  . Sexual Activity: Not on file   Other Topics Concern  . Not on file   Social History Narrative  . No narrative on file     BP 85/52  Pulse 81  Ht 5\' 4"  (1.626 m)  Wt 117 lb 1.9 oz (53.125 kg)  BMI 20.09 kg/m2  Physical Exam:  Well appearing middle aged woman, NAD HEENT: Unremarkable Neck: 7 cm  JVD, no thyromegally Lungs:  Clear with no wheezes, rales, or rhonchi. HEART:  Regular rate rhythm, 2/6 systolic murmurs and soft  diastolic murmur, no rubs, no clicks Abd:  soft, positive bowel sounds, no organomegally, no rebound, no guarding Ext:  2 plus pulses, no edema, no cyanosis, no clubbing Skin:  No rashes no nodules Neuro:  CN II through XII intact, motor grossly intact  ECG - normal sinus rhythm with biventricular pacing  DEVICE  Normal device function.  See PaceArt for details.   Assess/Plan:

## 2014-05-08 LAB — MDC_IDC_ENUM_SESS_TYPE_INCLINIC
Battery Remaining Longevity: 96 mo
Brady Statistic RA Percent Paced: 1 %
Brady Statistic RV Percent Paced: 100 %
HighPow Impedance: 56 Ohm
Implantable Pulse Generator Serial Number: 498164
Lead Channel Impedance Value: 1104 Ohm
Lead Channel Impedance Value: 496 Ohm
Lead Channel Impedance Value: 594 Ohm
Lead Channel Pacing Threshold Amplitude: 0.9 V
Lead Channel Pacing Threshold Amplitude: 1 V
Lead Channel Pacing Threshold Amplitude: 1.1 V
Lead Channel Pacing Threshold Pulse Width: 0.4 ms
Lead Channel Pacing Threshold Pulse Width: 0.4 ms
Lead Channel Pacing Threshold Pulse Width: 0.4 ms
Lead Channel Sensing Intrinsic Amplitude: 16.1 mV
Lead Channel Sensing Intrinsic Amplitude: 19.6 mV
Lead Channel Sensing Intrinsic Amplitude: 5.1 mV
Lead Channel Setting Pacing Amplitude: 2 V
Lead Channel Setting Pacing Amplitude: 2.2 V
Lead Channel Setting Pacing Amplitude: 2.4 V
Lead Channel Setting Pacing Pulse Width: 0.4 ms
Lead Channel Setting Pacing Pulse Width: 0.4 ms
Zone Setting Detection Interval: 240 ms
Zone Setting Detection Interval: 300 ms
Zone Setting Detection Interval: 342.9 ms

## 2014-05-13 DIAGNOSIS — I509 Heart failure, unspecified: Secondary | ICD-10-CM | POA: Diagnosis not present

## 2014-05-13 DIAGNOSIS — Z7901 Long term (current) use of anticoagulants: Secondary | ICD-10-CM | POA: Diagnosis not present

## 2014-05-20 DIAGNOSIS — I509 Heart failure, unspecified: Secondary | ICD-10-CM | POA: Diagnosis not present

## 2014-05-20 DIAGNOSIS — Z7901 Long term (current) use of anticoagulants: Secondary | ICD-10-CM | POA: Diagnosis not present

## 2014-05-25 ENCOUNTER — Telehealth: Payer: Self-pay | Admitting: Internal Medicine

## 2014-05-25 NOTE — Telephone Encounter (Signed)
Spoke with patient and gave her Dr Tanna Furry recommendations

## 2014-05-25 NOTE — Telephone Encounter (Signed)
New message      Want test results from echo.

## 2014-06-04 DIAGNOSIS — E7800 Pure hypercholesterolemia, unspecified: Secondary | ICD-10-CM | POA: Diagnosis not present

## 2014-06-04 DIAGNOSIS — I428 Other cardiomyopathies: Secondary | ICD-10-CM | POA: Diagnosis not present

## 2014-06-04 DIAGNOSIS — Z23 Encounter for immunization: Secondary | ICD-10-CM | POA: Diagnosis not present

## 2014-06-04 DIAGNOSIS — I10 Essential (primary) hypertension: Secondary | ICD-10-CM | POA: Diagnosis not present

## 2014-06-04 DIAGNOSIS — Z79899 Other long term (current) drug therapy: Secondary | ICD-10-CM | POA: Diagnosis not present

## 2014-06-04 DIAGNOSIS — Z7901 Long term (current) use of anticoagulants: Secondary | ICD-10-CM | POA: Diagnosis not present

## 2014-06-04 DIAGNOSIS — I509 Heart failure, unspecified: Secondary | ICD-10-CM | POA: Diagnosis not present

## 2014-06-05 ENCOUNTER — Other Ambulatory Visit: Payer: Self-pay

## 2014-06-05 MED ORDER — SPIRONOLACTONE 25 MG PO TABS
25.0000 mg | ORAL_TABLET | Freq: Every day | ORAL | Status: DC
Start: 2014-06-05 — End: 2015-07-08

## 2014-07-08 DIAGNOSIS — Z7901 Long term (current) use of anticoagulants: Secondary | ICD-10-CM | POA: Diagnosis not present

## 2014-07-08 DIAGNOSIS — I509 Heart failure, unspecified: Secondary | ICD-10-CM | POA: Diagnosis not present

## 2014-08-07 DIAGNOSIS — I509 Heart failure, unspecified: Secondary | ICD-10-CM | POA: Diagnosis not present

## 2014-08-07 DIAGNOSIS — Z7901 Long term (current) use of anticoagulants: Secondary | ICD-10-CM | POA: Diagnosis not present

## 2014-08-10 ENCOUNTER — Ambulatory Visit (INDEPENDENT_AMBULATORY_CARE_PROVIDER_SITE_OTHER): Payer: Medicare Other | Admitting: *Deleted

## 2014-08-10 ENCOUNTER — Encounter: Payer: Self-pay | Admitting: Internal Medicine

## 2014-08-10 DIAGNOSIS — I472 Ventricular tachycardia, unspecified: Secondary | ICD-10-CM

## 2014-08-10 LAB — MDC_IDC_ENUM_SESS_TYPE_REMOTE
Battery Remaining Longevity: 90 mo
Battery Remaining Percentage: 100 %
Brady Statistic RA Percent Paced: 0 %
Brady Statistic RV Percent Paced: 100 %
Date Time Interrogation Session: 20151116062200
HighPow Impedance: 53 Ohm
Implantable Pulse Generator Serial Number: 498164
Lead Channel Impedance Value: 1078 Ohm
Lead Channel Impedance Value: 457 Ohm
Lead Channel Impedance Value: 567 Ohm
Lead Channel Pacing Threshold Amplitude: 0.9 V
Lead Channel Pacing Threshold Amplitude: 1 V
Lead Channel Pacing Threshold Amplitude: 1.1 V
Lead Channel Pacing Threshold Pulse Width: 0.4 ms
Lead Channel Pacing Threshold Pulse Width: 0.4 ms
Lead Channel Pacing Threshold Pulse Width: 0.4 ms
Lead Channel Sensing Intrinsic Amplitude: 2.6 mV
Lead Channel Setting Pacing Amplitude: 2 V
Lead Channel Setting Pacing Amplitude: 2.2 V
Lead Channel Setting Pacing Amplitude: 2.4 V
Lead Channel Setting Pacing Pulse Width: 0.4 ms
Lead Channel Setting Pacing Pulse Width: 0.4 ms
Lead Channel Setting Sensing Sensitivity: 0.5 mV
Lead Channel Setting Sensing Sensitivity: 1 mV
Zone Setting Detection Interval: 240 ms
Zone Setting Detection Interval: 300 ms
Zone Setting Detection Interval: 343 ms

## 2014-08-10 NOTE — Progress Notes (Signed)
Remote ICD transmission.   

## 2014-08-13 ENCOUNTER — Encounter: Payer: Self-pay | Admitting: Cardiology

## 2014-08-18 ENCOUNTER — Other Ambulatory Visit: Payer: Self-pay | Admitting: *Deleted

## 2014-08-18 MED ORDER — FUROSEMIDE 40 MG PO TABS: ORAL_TABLET | ORAL | Status: DC

## 2014-09-04 DIAGNOSIS — Z7901 Long term (current) use of anticoagulants: Secondary | ICD-10-CM | POA: Diagnosis not present

## 2014-09-04 DIAGNOSIS — I509 Heart failure, unspecified: Secondary | ICD-10-CM | POA: Diagnosis not present

## 2014-09-22 DIAGNOSIS — I509 Heart failure, unspecified: Secondary | ICD-10-CM | POA: Diagnosis not present

## 2014-09-22 DIAGNOSIS — Z7901 Long term (current) use of anticoagulants: Secondary | ICD-10-CM | POA: Diagnosis not present

## 2014-10-13 DIAGNOSIS — Z7901 Long term (current) use of anticoagulants: Secondary | ICD-10-CM | POA: Diagnosis not present

## 2014-11-10 ENCOUNTER — Ambulatory Visit (INDEPENDENT_AMBULATORY_CARE_PROVIDER_SITE_OTHER): Payer: Medicare Other | Admitting: *Deleted

## 2014-11-10 DIAGNOSIS — I472 Ventricular tachycardia, unspecified: Secondary | ICD-10-CM

## 2014-11-10 LAB — MDC_IDC_ENUM_SESS_TYPE_REMOTE
Battery Remaining Longevity: 84 mo
Battery Remaining Percentage: 100 %
Brady Statistic RA Percent Paced: 0 %
Brady Statistic RV Percent Paced: 100 %
Date Time Interrogation Session: 20160216062100
HighPow Impedance: 55 Ohm
Implantable Pulse Generator Serial Number: 498164
Lead Channel Impedance Value: 1105 Ohm
Lead Channel Impedance Value: 464 Ohm
Lead Channel Impedance Value: 584 Ohm
Lead Channel Pacing Threshold Amplitude: 0.9 V
Lead Channel Pacing Threshold Amplitude: 1 V
Lead Channel Pacing Threshold Amplitude: 1.1 V
Lead Channel Pacing Threshold Pulse Width: 0.4 ms
Lead Channel Pacing Threshold Pulse Width: 0.4 ms
Lead Channel Pacing Threshold Pulse Width: 0.4 ms
Lead Channel Setting Pacing Amplitude: 2 V
Lead Channel Setting Pacing Amplitude: 2.2 V
Lead Channel Setting Pacing Amplitude: 2.4 V
Lead Channel Setting Pacing Pulse Width: 0.4 ms
Lead Channel Setting Pacing Pulse Width: 0.4 ms
Lead Channel Setting Sensing Sensitivity: 0.5 mV
Lead Channel Setting Sensing Sensitivity: 1 mV
Zone Setting Detection Interval: 240 ms
Zone Setting Detection Interval: 300 ms
Zone Setting Detection Interval: 343 ms

## 2014-11-10 NOTE — Progress Notes (Signed)
Remote ICD transmission.   

## 2014-11-13 DIAGNOSIS — Z7901 Long term (current) use of anticoagulants: Secondary | ICD-10-CM | POA: Diagnosis not present

## 2014-11-20 ENCOUNTER — Encounter: Payer: Self-pay | Admitting: *Deleted

## 2014-11-27 DIAGNOSIS — E780 Pure hypercholesterolemia: Secondary | ICD-10-CM | POA: Diagnosis not present

## 2014-11-27 DIAGNOSIS — Z79899 Other long term (current) drug therapy: Secondary | ICD-10-CM | POA: Diagnosis not present

## 2014-11-27 DIAGNOSIS — I10 Essential (primary) hypertension: Secondary | ICD-10-CM | POA: Diagnosis not present

## 2014-11-27 DIAGNOSIS — Z0001 Encounter for general adult medical examination with abnormal findings: Secondary | ICD-10-CM | POA: Diagnosis not present

## 2014-11-27 DIAGNOSIS — R52 Pain, unspecified: Secondary | ICD-10-CM | POA: Diagnosis not present

## 2014-11-27 DIAGNOSIS — I5022 Chronic systolic (congestive) heart failure: Secondary | ICD-10-CM | POA: Diagnosis not present

## 2014-11-27 DIAGNOSIS — Z7901 Long term (current) use of anticoagulants: Secondary | ICD-10-CM | POA: Diagnosis not present

## 2014-12-01 ENCOUNTER — Encounter: Payer: Self-pay | Admitting: Internal Medicine

## 2014-12-29 ENCOUNTER — Other Ambulatory Visit (HOSPITAL_COMMUNITY): Payer: Self-pay | Admitting: Family Medicine

## 2014-12-29 DIAGNOSIS — Z1231 Encounter for screening mammogram for malignant neoplasm of breast: Secondary | ICD-10-CM

## 2015-01-05 DIAGNOSIS — Z7901 Long term (current) use of anticoagulants: Secondary | ICD-10-CM | POA: Diagnosis not present

## 2015-01-12 ENCOUNTER — Ambulatory Visit (HOSPITAL_COMMUNITY)
Admission: RE | Admit: 2015-01-12 | Discharge: 2015-01-12 | Disposition: A | Discharge status: Home / Self Care | Payer: Medicare Other | Source: Ambulatory Visit | Attending: Family Medicine | Admitting: Family Medicine

## 2015-01-12 DIAGNOSIS — Z1231 Encounter for screening mammogram for malignant neoplasm of breast: Secondary | ICD-10-CM

## 2015-02-09 ENCOUNTER — Encounter: Payer: Self-pay | Admitting: Internal Medicine

## 2015-02-09 ENCOUNTER — Ambulatory Visit (INDEPENDENT_AMBULATORY_CARE_PROVIDER_SITE_OTHER): Payer: Medicare Other | Admitting: *Deleted

## 2015-02-09 DIAGNOSIS — I472 Ventricular tachycardia, unspecified: Secondary | ICD-10-CM

## 2015-02-09 LAB — CUP PACEART REMOTE DEVICE CHECK
Battery Remaining Longevity: 6.5
Brady Statistic RA Percent Paced: 0 %
Brady Statistic RV Percent Paced: 100 %
Date Time Interrogation Session: 20160520105541
HighPow Impedance: 50 Ohm
Lead Channel Impedance Value: 1067 Ohm
Lead Channel Impedance Value: 449 Ohm
Lead Channel Impedance Value: 564 Ohm
Lead Channel Sensing Intrinsic Amplitude: 2.6 mV
Lead Channel Setting Pacing Amplitude: 2 V
Lead Channel Setting Pacing Amplitude: 2.2 V
Lead Channel Setting Pacing Amplitude: 2.4 V
Lead Channel Setting Pacing Pulse Width: 0.4 ms
Lead Channel Setting Pacing Pulse Width: 0.4 ms
Pulse Gen Serial Number: 498164
Zone Setting Detection Interval: 240 ms
Zone Setting Detection Interval: 300 ms
Zone Setting Detection Interval: 342.9 ms

## 2015-02-09 NOTE — Progress Notes (Signed)
Remote ICD transmission.   

## 2015-02-10 DIAGNOSIS — Z7901 Long term (current) use of anticoagulants: Secondary | ICD-10-CM | POA: Diagnosis not present

## 2015-02-24 ENCOUNTER — Encounter: Payer: Self-pay | Admitting: Cardiology

## 2015-03-11 DIAGNOSIS — Z7901 Long term (current) use of anticoagulants: Secondary | ICD-10-CM | POA: Diagnosis not present

## 2015-03-12 ENCOUNTER — Other Ambulatory Visit: Payer: Self-pay | Admitting: *Deleted

## 2015-03-12 DIAGNOSIS — I509 Heart failure, unspecified: Secondary | ICD-10-CM

## 2015-03-12 MED ORDER — ISOSORB DINITRATE-HYDRALAZINE 20-37.5 MG PO TABS: 1.0000 | ORAL_TABLET | Freq: Two times a day (BID) | ORAL | Status: DC

## 2015-04-19 ENCOUNTER — Telehealth: Payer: Self-pay | Admitting: Internal Medicine

## 2015-04-19 NOTE — Telephone Encounter (Signed)
New Message  Pt request a call back to discuss if it is ok to intimate. Please call

## 2015-04-19 NOTE — Telephone Encounter (Signed)
Okay to be sexually active

## 2015-04-27 DIAGNOSIS — Z7901 Long term (current) use of anticoagulants: Secondary | ICD-10-CM | POA: Diagnosis not present

## 2015-04-28 ENCOUNTER — Encounter: Payer: Self-pay | Admitting: Gastroenterology

## 2015-05-04 DIAGNOSIS — Z7901 Long term (current) use of anticoagulants: Secondary | ICD-10-CM | POA: Diagnosis not present

## 2015-05-18 DIAGNOSIS — I10 Essential (primary) hypertension: Secondary | ICD-10-CM | POA: Diagnosis not present

## 2015-05-18 DIAGNOSIS — I959 Hypotension, unspecified: Secondary | ICD-10-CM | POA: Diagnosis not present

## 2015-05-18 DIAGNOSIS — Z7901 Long term (current) use of anticoagulants: Secondary | ICD-10-CM | POA: Diagnosis not present

## 2015-05-18 DIAGNOSIS — I5022 Chronic systolic (congestive) heart failure: Secondary | ICD-10-CM | POA: Diagnosis not present

## 2015-06-02 ENCOUNTER — Encounter: Payer: Self-pay | Admitting: Internal Medicine

## 2015-06-02 ENCOUNTER — Ambulatory Visit (INDEPENDENT_AMBULATORY_CARE_PROVIDER_SITE_OTHER): Payer: Medicare Other | Admitting: Internal Medicine

## 2015-06-02 VITALS — BP 110/56 | HR 71 | Ht 63.0 in | Wt 120.0 lb

## 2015-06-02 DIAGNOSIS — Z95810 Presence of automatic (implantable) cardiac defibrillator: Secondary | ICD-10-CM | POA: Diagnosis not present

## 2015-06-02 DIAGNOSIS — I359 Nonrheumatic aortic valve disorder, unspecified: Secondary | ICD-10-CM | POA: Diagnosis not present

## 2015-06-02 DIAGNOSIS — I5022 Chronic systolic (congestive) heart failure: Secondary | ICD-10-CM

## 2015-06-02 DIAGNOSIS — I472 Ventricular tachycardia, unspecified: Secondary | ICD-10-CM

## 2015-06-02 LAB — CUP PACEART INCLINIC DEVICE CHECK
Date Time Interrogation Session: 20160907040000
HighPow Impedance: 38 Ohm
HighPow Impedance: 56 Ohm
Lead Channel Impedance Value: 1163 Ohm
Lead Channel Impedance Value: 481 Ohm
Lead Channel Impedance Value: 598 Ohm
Lead Channel Pacing Threshold Amplitude: 0.9 V
Lead Channel Pacing Threshold Amplitude: 1 V
Lead Channel Pacing Threshold Amplitude: 1.1 V
Lead Channel Pacing Threshold Pulse Width: 0.4 ms
Lead Channel Pacing Threshold Pulse Width: 0.4 ms
Lead Channel Pacing Threshold Pulse Width: 0.4 ms
Lead Channel Sensing Intrinsic Amplitude: 16.6 mV
Lead Channel Sensing Intrinsic Amplitude: 18.1 mV
Lead Channel Sensing Intrinsic Amplitude: 3.7 mV
Lead Channel Setting Pacing Amplitude: 2 V
Lead Channel Setting Pacing Amplitude: 2.2 V
Lead Channel Setting Pacing Amplitude: 2.4 V
Lead Channel Setting Pacing Pulse Width: 0.4 ms
Lead Channel Setting Pacing Pulse Width: 0.4 ms
Lead Channel Setting Sensing Sensitivity: 0.5 mV
Lead Channel Setting Sensing Sensitivity: 1 mV
Pulse Gen Serial Number: 498164
Zone Setting Detection Interval: 240 ms
Zone Setting Detection Interval: 300 ms
Zone Setting Detection Interval: 343 ms

## 2015-06-02 NOTE — Patient Instructions (Addendum)
Medication Instructions:  Your physician recommends that you continue on your current medications as directed. Please refer to the Current Medication list given to you today.  Labwork: None ordered  Testing/Procedures: Your physician has requested that you have an echocardiogram in 5 months (before next appointment with Dr. Lovena Le). Echocardiography is a painless test that uses sound waves to create images of your heart. It provides your doctor with information about the size and shape of your heart and how well your heart's chambers and valves are working. This procedure takes approximately one hour. There are no restrictions for this procedure.  Follow-Up: Remote monitoring is used to monitor your Pacemaker of ICD from home. This monitoring reduces the number of office visits required to check your device to one time per year. It allows [place] to keep an eye on the functioning of your device to ensure it is working properly. You are scheduled for a device check from home on 09/01/15. You may send your transmission at any time that day. If you have a wireless device, the transmission will be sent automatically. After your physician reviews your transmission, you will receive a postcard with your next transmission date.  Your physician wants you to follow-up in: 6 months with Dr. Lovena Le.  You will receive a reminder letter in the mail two months in advance. If you don't receive a letter, please call our office to schedule the follow-up appointment.   Any Other Special Instructions Will Be Listed Below (If Applicable). Thank you for choosing [facility]!!

## 2015-06-02 NOTE — Assessment & Plan Note (Signed)
Her BiV ICD is working normally. Will recheck in several months.

## 2015-06-02 NOTE — Assessment & Plan Note (Signed)
Her EF has normalized by echo after BiV implant. She is on maixmal CHF medications. Will follow.

## 2015-06-02 NOTE — Assessment & Plan Note (Signed)
She remains asymptomatic despite a severely increased mean gradient. I have discussed the symptoms to watch for: syncope, sob, and chest pressure. She denies all. She will call us if she develops any of the above. Surgery would be consulted.

## 2015-06-02 NOTE — Progress Notes (Signed)
HPI [name] returns today for followup. She is a very pleasant 59 year old woman with a history of chronic systolic heart failure and left bundle branch block, with normalization of her LV function after BiV Pacing as well as dyslipidemia. She is status post biventricular ICD implantation. In the interim, she has done well. She has a h/o aortic stenosis but has remained asymptomatic despite an elevated mean gradient. She has rare palpitations, denies chest pain, shortness of breath, or peripheral edema. Her dose of Bidil has been decreased. No Known Allergies   Current Outpatient Prescriptions  Medication Sig Dispense Refill  . carvedilol (COREG) 25 MG tablet Take 25 mg by mouth 2 (two) times daily with a meal. Take 1/2 tab twice a day    . furosemide (LASIX) 40 MG tablet Take 1 tablet (40 mg) by mouth every other day. 15 tablet 11  . isosorbide-hydrALAZINE (BIDIL) 20-37.5 MG per tablet Take 1 tablet by mouth 2 (two) times daily. (Patient taking differently: Take 1 tablet by mouth 2 (two) times daily. 1/2 twice daily) 180 tablet 1  . Multiple Vitamin (MULTIVITAMIN) capsule Take 1 capsule by mouth daily.      Marland Kitchen olmesartan (BENICAR) 20 MG tablet Take 10 mg by mouth daily.      . rosuvastatin (CRESTOR) 10 MG tablet Take 1 tablet (10 mg total) by mouth daily. 90 tablet 3  . spironolactone (ALDACTONE) 25 MG tablet Take 1 tablet (25 mg total) by mouth daily. 90 tablet 3  . warfarin (COUMADIN) 5 MG tablet Use as directed      No current facility-administered medications for this visit.     Past Medical History  Diagnosis Date  . Aortic stenosis   . Cardiomyopathy secondary   . Hyperlipidemia   . HTN (hypertension)   . Bicuspid aortic valve   . Abnormal liver function tests   . CHF (congestive heart failure)   . Benign neoplasm of colon 04/19/2001    Hyperplastic  . S/P ICD (internal cardiac defibrillator) procedure     ROS:   All systems reviewed and negative except as noted in the  HPI.   Past Surgical History  Procedure Laterality Date  . Laparoscopic cholecystectomy with intraoperative cholangiogram    . Implantation of a guidant biventricular icd       Family History  Problem Relation Age of Onset  . Pancreatic cancer Mother   . Colon cancer Maternal Aunt   . Bone cancer Brother      Social History   Social History  . Marital Status: Single    Spouse Name: N/A  . Number of Children: 1  . Years of Education: N/A   Occupational History  . Therapist, art   .     Social History Main Topics  . Smoking status: Former Smoker    Quit date: 09/25/2010  . Smokeless tobacco: Never Used     Comment: none in last 15 days  . Alcohol Use: Yes     Comment: occasional  . Drug Use: No  . Sexual Activity: Not on file   Other Topics Concern  . Not on file   Social History Narrative     BP 110/56 mmHg  Pulse 71  Ht 5\' 3"  (1.6 m)  Wt 120 lb (54.432 kg)  BMI 21.26 kg/m2  Physical Exam:  Well appearing middle aged woman, NAD HEENT: Unremarkable Neck: 7 cm  JVD, no thyromegally Lungs:  Clear with no wheezes, rales, or rhonchi. HEART:  Regular rate rhythm,  2/6 systolic murmurs and soft diastolic murmur, no rubs, no clicks, A2 is reduced markedly Abd:  soft, positive bowel sounds, no organomegally, no rebound, no guarding Ext:  2 plus pulses, no edema, no cyanosis, no clubbing Skin:  No rashes no nodules Neuro:  CN II through XII intact, motor grossly intact  ECG - normal sinus rhythm with biventricular pacing  DEVICE  Normal device function.  See PaceArt for details.   Assess/Plan:

## 2015-06-09 DIAGNOSIS — Z79899 Other long term (current) drug therapy: Secondary | ICD-10-CM | POA: Diagnosis not present

## 2015-06-09 DIAGNOSIS — Z7901 Long term (current) use of anticoagulants: Secondary | ICD-10-CM | POA: Diagnosis not present

## 2015-06-09 DIAGNOSIS — I10 Essential (primary) hypertension: Secondary | ICD-10-CM | POA: Diagnosis not present

## 2015-06-09 DIAGNOSIS — I5022 Chronic systolic (congestive) heart failure: Secondary | ICD-10-CM | POA: Diagnosis not present

## 2015-06-09 DIAGNOSIS — I429 Cardiomyopathy, unspecified: Secondary | ICD-10-CM | POA: Diagnosis not present

## 2015-06-15 DIAGNOSIS — Z7901 Long term (current) use of anticoagulants: Secondary | ICD-10-CM | POA: Diagnosis not present

## 2015-06-29 DIAGNOSIS — Z7901 Long term (current) use of anticoagulants: Secondary | ICD-10-CM | POA: Diagnosis not present

## 2015-07-06 DIAGNOSIS — Z23 Encounter for immunization: Secondary | ICD-10-CM | POA: Diagnosis not present

## 2015-07-08 ENCOUNTER — Other Ambulatory Visit: Payer: Self-pay

## 2015-07-08 MED ORDER — SPIRONOLACTONE 25 MG PO TABS: 25.0000 mg | ORAL_TABLET | Freq: Every day | ORAL | Status: DC

## 2015-07-08 NOTE — Telephone Encounter (Signed)
Tiffany Lance, MD at 06/02/2015 8:23 AM  spironolactone (ALDACTONE) 25 MG tabletTake 1 tablet (25 mg total) by mouth daily Patient Instructions     Medication Instructions:  Your physician recommends that you continue on your current medications as directed. Please refer to the Current Medication list given to you today.

## 2015-07-28 DIAGNOSIS — Z7901 Long term (current) use of anticoagulants: Secondary | ICD-10-CM | POA: Diagnosis not present

## 2015-08-04 DIAGNOSIS — Z7901 Long term (current) use of anticoagulants: Secondary | ICD-10-CM | POA: Diagnosis not present

## 2015-08-11 DIAGNOSIS — F320 Major depressive disorder, single episode, mild: Secondary | ICD-10-CM | POA: Diagnosis not present

## 2015-08-11 DIAGNOSIS — F411 Generalized anxiety disorder: Secondary | ICD-10-CM | POA: Diagnosis not present

## 2015-08-11 DIAGNOSIS — I5022 Chronic systolic (congestive) heart failure: Secondary | ICD-10-CM | POA: Diagnosis not present

## 2015-08-11 DIAGNOSIS — Z7901 Long term (current) use of anticoagulants: Secondary | ICD-10-CM | POA: Diagnosis not present

## 2015-08-26 DIAGNOSIS — Z7901 Long term (current) use of anticoagulants: Secondary | ICD-10-CM | POA: Diagnosis not present

## 2015-08-30 DIAGNOSIS — F4323 Adjustment disorder with mixed anxiety and depressed mood: Secondary | ICD-10-CM | POA: Diagnosis not present

## 2015-09-01 ENCOUNTER — Ambulatory Visit (INDEPENDENT_AMBULATORY_CARE_PROVIDER_SITE_OTHER): Payer: Medicare Other | Admitting: *Deleted

## 2015-09-01 DIAGNOSIS — I472 Ventricular tachycardia, unspecified: Secondary | ICD-10-CM

## 2015-09-02 NOTE — Progress Notes (Signed)
Remote ICD transmission.   

## 2015-09-08 DIAGNOSIS — F411 Generalized anxiety disorder: Secondary | ICD-10-CM | POA: Diagnosis not present

## 2015-09-09 LAB — CUP PACEART REMOTE DEVICE CHECK
Battery Remaining Longevity: 72 mo
Brady Statistic RA Percent Paced: 0 %
Brady Statistic RV Percent Paced: 100 %
Date Time Interrogation Session: 20161215152621
HighPow Impedance: 50 Ohm
Implantable Lead Implant Date: 20060630
Implantable Lead Implant Date: 20060630
Implantable Lead Implant Date: 20060630
Implantable Lead Location: 753858
Implantable Lead Location: 753859
Implantable Lead Location: 753860
Implantable Lead Model: 157
Implantable Lead Model: 4086
Implantable Lead Model: 4543
Implantable Lead Serial Number: 119717
Implantable Lead Serial Number: 131677
Implantable Lead Serial Number: 223436
Lead Channel Impedance Value: 1124 Ohm
Lead Channel Impedance Value: 454 Ohm
Lead Channel Impedance Value: 549 Ohm
Lead Channel Sensing Intrinsic Amplitude: 3.1 mV
Pulse Gen Serial Number: 498164

## 2015-09-15 ENCOUNTER — Encounter: Payer: Self-pay | Admitting: Cardiology

## 2015-09-16 DIAGNOSIS — Z7901 Long term (current) use of anticoagulants: Secondary | ICD-10-CM | POA: Diagnosis not present

## 2015-10-07 DIAGNOSIS — F4323 Adjustment disorder with mixed anxiety and depressed mood: Secondary | ICD-10-CM | POA: Diagnosis not present

## 2015-10-19 DIAGNOSIS — Z7901 Long term (current) use of anticoagulants: Secondary | ICD-10-CM | POA: Diagnosis not present

## 2015-10-26 DIAGNOSIS — Z7901 Long term (current) use of anticoagulants: Secondary | ICD-10-CM | POA: Diagnosis not present

## 2015-11-02 ENCOUNTER — Ambulatory Visit (HOSPITAL_COMMUNITY): Payer: Medicare Other | Attending: Cardiovascular Disease

## 2015-11-02 ENCOUNTER — Other Ambulatory Visit: Payer: Self-pay

## 2015-11-02 DIAGNOSIS — I10 Essential (primary) hypertension: Secondary | ICD-10-CM | POA: Diagnosis not present

## 2015-11-02 DIAGNOSIS — I472 Ventricular tachycardia, unspecified: Secondary | ICD-10-CM

## 2015-11-02 DIAGNOSIS — I059 Rheumatic mitral valve disease, unspecified: Secondary | ICD-10-CM | POA: Diagnosis not present

## 2015-11-02 DIAGNOSIS — E785 Hyperlipidemia, unspecified: Secondary | ICD-10-CM | POA: Insufficient documentation

## 2015-11-02 DIAGNOSIS — I352 Nonrheumatic aortic (valve) stenosis with insufficiency: Secondary | ICD-10-CM | POA: Insufficient documentation

## 2015-11-02 DIAGNOSIS — I517 Cardiomegaly: Secondary | ICD-10-CM | POA: Diagnosis not present

## 2015-11-02 DIAGNOSIS — I5022 Chronic systolic (congestive) heart failure: Secondary | ICD-10-CM

## 2015-11-09 DIAGNOSIS — Z7901 Long term (current) use of anticoagulants: Secondary | ICD-10-CM | POA: Diagnosis not present

## 2015-11-09 NOTE — Progress Notes (Unsigned)
The patients AS has worsened and is now critical. She will need referral to our structural heart program to consider AVR vs TAVR.

## 2015-11-25 DIAGNOSIS — L02411 Cutaneous abscess of right axilla: Secondary | ICD-10-CM | POA: Diagnosis not present

## 2015-11-25 DIAGNOSIS — Z7901 Long term (current) use of anticoagulants: Secondary | ICD-10-CM | POA: Diagnosis not present

## 2015-11-25 DIAGNOSIS — F411 Generalized anxiety disorder: Secondary | ICD-10-CM | POA: Diagnosis not present

## 2015-11-29 DIAGNOSIS — L02411 Cutaneous abscess of right axilla: Secondary | ICD-10-CM | POA: Diagnosis not present

## 2015-11-29 DIAGNOSIS — Z7901 Long term (current) use of anticoagulants: Secondary | ICD-10-CM | POA: Diagnosis not present

## 2015-12-02 ENCOUNTER — Encounter: Payer: Self-pay | Admitting: Cardiovascular Disease

## 2015-12-02 ENCOUNTER — Ambulatory Visit (INDEPENDENT_AMBULATORY_CARE_PROVIDER_SITE_OTHER): Payer: Medicare Other | Admitting: Cardiovascular Disease

## 2015-12-02 VITALS — BP 88/40 | HR 68 | Ht 64.0 in | Wt 128.4 lb

## 2015-12-02 DIAGNOSIS — I350 Nonrheumatic aortic (valve) stenosis: Secondary | ICD-10-CM

## 2015-12-02 NOTE — Patient Instructions (Signed)
Medication Instructions:  Your physician recommends that you continue on your current medications as directed. Please refer to the Current Medication list given to you today.   Labwork: none  Testing/Procedures: Your physician has requested that you have an echocardiogram. Echocardiography is a painless test that uses sound waves to create images of your heart. It provides your doctor with information about the size and shape of your heart and how well your heart's chambers and valves are working. This procedure takes approximately one hour. There are no restrictions for this procedure. To be done in 6 months.     Follow-Up: Your physician wants you to follow-up in: 6 months--week or so after echo.  You will receive a reminder letter in the mail two months in advance. If you don't receive a letter, please call our office to schedule the follow-up appointment.   Any Other Special Instructions Will Be Listed Below (If Applicable).     If you need a refill on your cardiac medications before your next appointment, please call your pharmacy.

## 2015-12-02 NOTE — Progress Notes (Signed)
Multi-disciplinary Valve Clinic  Chief Complaint  Patient presents with  . Aortic Stenosis   Referring: Cristopher Peru  History of Present Illness: 60 yo female with history of non-ischemic cardiomyopathy s/p ICD with resolution of LV function, chronic systolic CHF, aortic stenosis, HLD, HTN here today as a new patient for evaluation of aortic stenosis. She is followed by Dr. Lovena Le for her non-ischemic cardiomyopathy and ICD. She is referred to valve clinic today for further of her aortic valve stenosis. Most recent echo 11/02/15 with severe aortic valve stenosis with mean gradient of 51 mm Hg. She is a former smoker. She tells me today that she has felt well. She has no dyspnea, dizziness, chest pain, weight gain or lower extremity edema. She is on chronic coumadin therapy. She was told this was for her cardiomyopathy. She has had no bleeding issues. She is an active person.   Primary Care Physician: Lujean Amel, MD   Past Medical History  Diagnosis Date  . Aortic stenosis   . Cardiomyopathy secondary   . Hyperlipidemia   . HTN (hypertension)   . Bicuspid aortic valve   . Abnormal liver function tests   . CHF (congestive heart failure) ([place])   . Benign neoplasm of colon 04/19/2001    Hyperplastic  . S/P ICD (internal cardiac defibrillator) procedure     Past Surgical History  Procedure Laterality Date  . Laparoscopic cholecystectomy with intraoperative cholangiogram    . Implantation of a guidant biventricular icd      Current Outpatient Prescriptions  Medication Sig Dispense Refill  . ALPRAZolam (XANAX) 0.25 MG tablet Take 0.25 mg by mouth at bedtime as needed for anxiety.    . carvedilol (COREG) 25 MG tablet Take 25 mg by mouth 2 (two) times daily with a meal.     . furosemide (LASIX) 40 MG tablet Take 1 tablet (40 mg) by mouth every other day. 15 tablet 11  . isosorbide-hydrALAZINE (BIDIL) 20-37.5 MG tablet Take by mouth. TAKE 1/2 TABLET BY MOUTH TWO TIMES DAILY    .  Multiple Vitamin (MULTIVITAMIN) capsule Take 1 capsule by mouth daily.      Marland Kitchen olmesartan (BENICAR) 20 MG tablet Take 10 mg by mouth daily.      . rosuvastatin (CRESTOR) 10 MG tablet Take 1 tablet (10 mg total) by mouth daily. 90 tablet 3  . spironolactone (ALDACTONE) 25 MG tablet Take 1 tablet (25 mg total) by mouth daily. 90 tablet 3  . sulfamethoxazole-trimethoprim (BACTRIM DS,SEPTRA DS) 800-160 MG tablet Take 1 tablet by mouth 2 (two) times daily.    Marland Kitchen warfarin (COUMADIN) 5 MG tablet Use as directed      No current facility-administered medications for this visit.    No Known Allergies  Social History   Social History  . Marital Status: Single    Spouse Name: N/A  . Number of Children: 1  . Years of Education: N/A   Occupational History  . Therapist, art   .     Social History Main Topics  . Smoking status: Former Smoker -- 1.00 packs/day for 40 years    Types: Cigarettes    Quit date: 09/25/2010  . Smokeless tobacco: Never Used     Comment: none in last 15 days  . Alcohol Use: 0.0 oz/week    0 Standard drinks or equivalent per week     Comment: occasional  . Drug Use: No  . Sexual Activity: Not on file   Other Topics Concern  . Not on file  Social History Narrative    Family History  Problem Relation Age of Onset  . Pancreatic cancer Mother   . Colon cancer Maternal Aunt   . Bone cancer Brother   . Heart attack Brother     Review of Systems:  As stated in the HPI and otherwise negative.   BP 88/40 mmHg  Pulse 68  Ht 5\' 4"  (1.626 m)  Wt 128 lb 6.4 oz (58.242 kg)  BMI 22.03 kg/m2  Physical Examination: General: Well developed, well nourished, NAD HEENT: OP clear, mucus membranes moist SKIN: warm, dry. No rashes. Neuro: No focal deficits Musculoskeletal: Muscle strength 5/5 all ext Psychiatric: Mood and affect normal Neck: No JVD, no carotid bruits, no thyromegaly, no lymphadenopathy. Lungs:Clear bilaterally, no wheezes, rhonci,  crackles Cardiovascular: Regular rate and rhythm. Loud harsh systolic murmur. No gallops or rubs. Abdomen:Soft. Bowel sounds present. Non-tender.  Extremities: No lower extremity edema. Pulses are 2 + in the bilateral DP/PT.  Echo 11/02/15: Left ventricle: The cavity size was normal. There was moderate  concentric hypertrophy. Systolic function was normal. The  estimated ejection fraction was in the range of 60% to 65%. Wall  motion was normal; there were no regional wall motion  abnormalities. Features are consistent with a pseudonormal left  ventricular filling pattern, with concomitant abnormal relaxation  and increased filling pressure (grade 2 diastolic dysfunction).  Doppler parameters are consistent with elevated ventricular  end-diastolic filling pressure. - Aortic valve: Trileaflet; moderately thickened, severely  calcified leaflets. Valve mobility was restricted. There was  severe stenosis. There was moderate regurgitation. Mean gradient  (S): 51 mm Hg. Valve area (VTI): 0.81 cm^2. - Mitral valve: Calcified annulus. Transvalvular velocity was  within the normal range. There was no evidence for stenosis. - Right ventricle: The cavity size was normal. Wall thickness was  normal. Systolic function was mildly reduced. - Inferior vena cava: The vessel was normal in size. The  respirophasic diameter changes were in the normal range (>= 50%),  consistent with normal central venous pressure.  EKG:  EKG is not ordered today. The ekg from 9/16 shows ventricular pacing  Recent Labs: No results found for requested labs within last 365 days.    Wt Readings from Last 3 Encounters:  12/02/15 128 lb 6.4 oz (58.242 kg)  06/02/15 120 lb (54.432 kg)  05/07/14 117 lb 1.9 oz (53.125 kg)    Other studies Reviewed: Additional studies/ records that were reviewed today include: Echo images. I personally reviewed. . Review of the above records demonstrates: severe AS  STS Risk  Score:  Risk of Mortality: 1.231%  Morbidity or Mortality: 10.963%  Long Length of Stay: 3.462%  Short Length of Stay: 51.122%  Permanent Stroke: 0.987%  Prolonged Ventilation: 5.904%  DSW Infection: 0.075%  Renal Failure: 1.635%  Reoperation: 6.114%    Assessment and Plan:   1. Aortic stenosis: She has severe aortic valve stenosis. Her aortic valve is thickened and calcified. I have personally reviewed her echo images. She most likely has a bicuspid valve. She is asymptomatic at this time so I think we have time to wait before proceeding with AVR. She is most likely will not be considered for TAVR as she is young with few comorbid conditions. She would likely be a candidate for conventional surgical AVR. STS risk of mortality 1.2% with surgical AVR. I have reviewed in detail today with the patient the etiology of aortic stenosis, symptoms and treatment options. Will repeat echo in 6 months and will plan to  see her back in valve clinic after that.  It is not clear why she is on coumadin and she does not know.   Current medicines are reviewed at length with the patient today.  The patient does not have concerns regarding medicines.  The following changes have been made:  no change  Labs/ tests ordered today include: Echo in 6 months  Orders Placed This Encounter  Procedures  . Echocardiogram    Disposition:   FU with me in 6 months  Signed, Lauree Chandler, MD 12/02/2015 1:37 PM    [facility] [address], [place], [place]  [address] Phone: [phone]; Fax: [phone]

## 2015-12-06 ENCOUNTER — Encounter: Payer: Self-pay | Admitting: Internal Medicine

## 2015-12-06 ENCOUNTER — Ambulatory Visit (INDEPENDENT_AMBULATORY_CARE_PROVIDER_SITE_OTHER): Payer: Medicare Other | Admitting: Internal Medicine

## 2015-12-06 VITALS — BP 110/86 | HR 76 | Ht 64.0 in | Wt 128.0 lb

## 2015-12-06 DIAGNOSIS — I10 Essential (primary) hypertension: Secondary | ICD-10-CM

## 2015-12-06 DIAGNOSIS — I5022 Chronic systolic (congestive) heart failure: Secondary | ICD-10-CM | POA: Diagnosis not present

## 2015-12-06 DIAGNOSIS — Z95810 Presence of automatic (implantable) cardiac defibrillator: Secondary | ICD-10-CM | POA: Diagnosis not present

## 2015-12-06 DIAGNOSIS — I472 Ventricular tachycardia, unspecified: Secondary | ICD-10-CM

## 2015-12-06 LAB — CUP PACEART INCLINIC DEVICE CHECK
Brady Statistic RA Percent Paced: 1 %
Brady Statistic RV Percent Paced: 100 %
Date Time Interrogation Session: 20170313040000
HighPow Impedance: 38 Ohm
HighPow Impedance: 62 Ohm
Implantable Lead Implant Date: 20060630
Implantable Lead Implant Date: 20060630
Implantable Lead Implant Date: 20060630
Implantable Lead Location: 753858
Implantable Lead Location: 753859
Implantable Lead Location: 753860
Implantable Lead Model: 157
Implantable Lead Model: 4086
Implantable Lead Model: 4543
Implantable Lead Serial Number: 119717
Implantable Lead Serial Number: 131677
Implantable Lead Serial Number: 223436
Lead Channel Impedance Value: 1281 Ohm
Lead Channel Impedance Value: 487 Ohm
Lead Channel Impedance Value: 637 Ohm
Lead Channel Pacing Threshold Amplitude: 0.7 V
Lead Channel Pacing Threshold Amplitude: 0.9 V
Lead Channel Pacing Threshold Amplitude: 1 V
Lead Channel Pacing Threshold Pulse Width: 0.4 ms
Lead Channel Pacing Threshold Pulse Width: 0.4 ms
Lead Channel Pacing Threshold Pulse Width: 0.4 ms
Lead Channel Sensing Intrinsic Amplitude: 14.9 mV
Lead Channel Sensing Intrinsic Amplitude: 19.7 mV
Lead Channel Sensing Intrinsic Amplitude: 3.5 mV
Lead Channel Setting Pacing Amplitude: 2 V
Lead Channel Setting Pacing Amplitude: 2.2 V
Lead Channel Setting Pacing Amplitude: 2.4 V
Lead Channel Setting Pacing Pulse Width: 0.4 ms
Lead Channel Setting Pacing Pulse Width: 0.4 ms
Lead Channel Setting Sensing Sensitivity: 0.5 mV
Lead Channel Setting Sensing Sensitivity: 1 mV
Pulse Gen Serial Number: 498164

## 2015-12-06 NOTE — Progress Notes (Signed)
HPI [name] returns today for followup. She is a very pleasant 60 year old woman with a history of chronic systolic heart failure and left bundle branch block, with normalization of her LV function after BiV Pacing as well as dyslipidemia. She is status post biventricular ICD implantation. In the interim, she has done well. She has a h/o aortic stenosis but has remained asymptomatic despite an elevated mean gradient. She has rare palpitations, denies chest pain, shortness of breath, or peripheral edema. Her dose of Bidil has been decreased. She saw Dr. [facility] several weeks ago but she was felt not to be symptomatic enough to justify valve replacement and he thought her preoperative risk not high enough to warrant TAVR. He did note that it was unclear as to why she was on coumadin. I am not sure either. Previously she had severe LV dysfunction and sometime around 2005 but not in 2003 was she placed on coumadin and has been on this medication since. No bleeding problems. I do not see a h/o of atrial fib. She may have had a LV thrombus. No Known Allergies   Current Outpatient Prescriptions  Medication Sig Dispense Refill  . ALPRAZolam (XANAX) 0.25 MG tablet Take 0.25 mg by mouth at bedtime as needed for anxiety.    . carvedilol (COREG) 25 MG tablet Take 25 mg by mouth 2 (two) times daily with a meal.     . furosemide (LASIX) 40 MG tablet Take 1 tablet (40 mg) by mouth every other day. 15 tablet 11  . isosorbide-hydrALAZINE (BIDIL) 20-37.5 MG tablet Take by mouth. TAKE 1/2 TABLET BY MOUTH TWO TIMES DAILY    . Multiple Vitamin (MULTIVITAMIN) capsule Take 1 capsule by mouth daily.      Marland Kitchen olmesartan (BENICAR) 20 MG tablet Take 10 mg by mouth daily.      . rosuvastatin (CRESTOR) 10 MG tablet Take 1 tablet (10 mg total) by mouth daily. 90 tablet 3  . spironolactone (ALDACTONE) 25 MG tablet Take 1 tablet (25 mg total) by mouth daily. 90 tablet 3  . sulfamethoxazole-trimethoprim (BACTRIM DS,SEPTRA DS) 800-160  MG tablet Take 1 tablet by mouth 2 (two) times daily.    Marland Kitchen warfarin (COUMADIN) 5 MG tablet TAKES 2.5 MG 3 DAYS A WEEK AND 5 MG THE OTHER DAYS BY MOUTH. Use as directed     No current facility-administered medications for this visit.     Past Medical History  Diagnosis Date  . Aortic stenosis   . Cardiomyopathy secondary   . Hyperlipidemia   . HTN (hypertension)   . Bicuspid aortic valve   . Abnormal liver function tests   . CHF (congestive heart failure) ([place])   . Benign neoplasm of colon 04/19/2001    Hyperplastic  . S/P ICD (internal cardiac defibrillator) procedure     ROS:   All systems reviewed and negative except as noted in the HPI.   Past Surgical History  Procedure Laterality Date  . Laparoscopic cholecystectomy with intraoperative cholangiogram    . Implantation of a guidant biventricular icd       Family History  Problem Relation Age of Onset  . Pancreatic cancer Mother   . Colon cancer Maternal Aunt   . Bone cancer Brother   . Heart attack Brother      Social History   Social History  . Marital Status: Single    Spouse Name: N/A  . Number of Children: 1  . Years of Education: N/A   Occupational History  . [employer]  receptionist   .     Social History Main Topics  . Smoking status: Former Smoker -- 1.00 packs/day for 40 years    Types: Cigarettes    Quit date: 09/25/2010  . Smokeless tobacco: Never Used     Comment: none in last 15 days  . Alcohol Use: 0.0 oz/week    0 Standard drinks or equivalent per week     Comment: occasional  . Drug Use: No  . Sexual Activity: Not on file   Other Topics Concern  . Not on file   Social History Narrative     BP 110/86 mmHg  Pulse 76  Ht 5\' 4"  (1.626 m)  Wt 128 lb (58.06 kg)  BMI 21.96 kg/m2  Physical Exam:  Well appearing middle aged woman, NAD HEENT: Unremarkable Neck: 7 cm  JVD, no thyromegally Lungs:  Clear with no wheezes, rales, or rhonchi. HEART:  Regular rate rhythm, 2/6 systolic  murmurs and soft diastolic murmur, no rubs, no clicks, A2 is reduced markedly Abd:  soft, positive bowel sounds, no organomegally, no rebound, no guarding Ext:  2 plus pulses, no edema, no cyanosis, no clubbing Skin:  No rashes no nodules Neuro:  CN II through XII intact, motor grossly intact  ECG - normal sinus rhythm with biventricular pacing  DEVICE  Normal device function.  See PaceArt for details.   Assess/Plan: 1. Chronic systolic heart failure, now with normalization of her LV function - will continue her current meds for now. 2. Aortic stenosis, likely related to a bicuspid aortic valve - she remains minimally if at all symptomatic. She will follow with Dr. [facility] 3. ICD - she is s/p Biv ICD and her device is working normally.   Mikle Bosworth.D.

## 2015-12-06 NOTE — Patient Instructions (Signed)
Medication Instructions:  Your physician recommends that you continue on your current medications as directed. Please refer to the Current Medication list given to you today.   Labwork: none  Testing/Procedures: none  Follow-Up: Remote monitoring is used to monitor your Pacemaker or ICD from home. This monitoring reduces the number of office visits required to check your device to one time per year. It allows [place] to keep an eye on the functioning of your device to ensure it is working properly. You are scheduled for a device check from home on 03/06/2016. You may send your transmission at any time that day. If you have a wireless device, the transmission will be sent automatically. After your physician reviews your transmission, you will receive a postcard with your next transmission date.  Your physician wants you to follow-up in: 12 months with Dr. Lovena Le. You will receive a reminder letter in the mail two months in advance. If you don't receive a letter, please call our office to schedule the follow-up appointment.   Any Other Special Instructions Will Be Listed Below (If Applicable).     If you need a refill on your cardiac medications before your next appointment, please call your pharmacy.

## 2015-12-16 DIAGNOSIS — Z79899 Other long term (current) drug therapy: Secondary | ICD-10-CM | POA: Diagnosis not present

## 2015-12-16 DIAGNOSIS — Z0001 Encounter for general adult medical examination with abnormal findings: Secondary | ICD-10-CM | POA: Diagnosis not present

## 2015-12-16 DIAGNOSIS — I5022 Chronic systolic (congestive) heart failure: Secondary | ICD-10-CM | POA: Diagnosis not present

## 2015-12-16 DIAGNOSIS — I10 Essential (primary) hypertension: Secondary | ICD-10-CM | POA: Diagnosis not present

## 2015-12-16 DIAGNOSIS — Z7901 Long term (current) use of anticoagulants: Secondary | ICD-10-CM | POA: Diagnosis not present

## 2015-12-16 DIAGNOSIS — E7800 Pure hypercholesterolemia, unspecified: Secondary | ICD-10-CM | POA: Diagnosis not present

## 2015-12-16 DIAGNOSIS — F411 Generalized anxiety disorder: Secondary | ICD-10-CM | POA: Diagnosis not present

## 2016-01-13 DIAGNOSIS — Z7901 Long term (current) use of anticoagulants: Secondary | ICD-10-CM | POA: Diagnosis not present

## 2016-01-13 LAB — PROTIME-INR: INR: 2.7 — AB (ref 0.9–1.1)

## 2016-01-26 ENCOUNTER — Other Ambulatory Visit: Payer: Self-pay

## 2016-01-26 DIAGNOSIS — Z1231 Encounter for screening mammogram for malignant neoplasm of breast: Secondary | ICD-10-CM

## 2016-02-04 ENCOUNTER — Ambulatory Visit
Admission: RE | Admit: 2016-02-04 | Discharge: 2016-02-04 | Disposition: A | Discharge status: Home / Self Care | Payer: Medicare Other | Source: Ambulatory Visit

## 2016-02-04 DIAGNOSIS — Z1231 Encounter for screening mammogram for malignant neoplasm of breast: Secondary | ICD-10-CM

## 2016-02-08 ENCOUNTER — Telehealth: Payer: Self-pay | Admitting: Cardiovascular Disease

## 2016-02-08 NOTE — Telephone Encounter (Signed)
New Message   Patient calling the office for samples of medication:   1.  What medication and dosage are you requesting samples for? Bidil 20-37.5  2.  Are you currently out of this medication? Yes

## 2016-02-16 ENCOUNTER — Encounter: Payer: Self-pay | Admitting: Endocrinology

## 2016-02-16 ENCOUNTER — Ambulatory Visit (INDEPENDENT_AMBULATORY_CARE_PROVIDER_SITE_OTHER): Payer: Medicare Other | Admitting: Endocrinology

## 2016-02-16 DIAGNOSIS — E213 Hyperparathyroidism, unspecified: Secondary | ICD-10-CM

## 2016-02-16 DIAGNOSIS — E210 Primary hyperparathyroidism: Secondary | ICD-10-CM | POA: Insufficient documentation

## 2016-02-16 DIAGNOSIS — N951 Menopausal and female climacteric states: Secondary | ICD-10-CM

## 2016-02-16 LAB — MAGNESIUM: Magnesium: 1.9 mg/dL (ref 1.5–2.5)

## 2016-02-16 LAB — PHOSPHORUS: Phosphorus: 2.5 mg/dL (ref 2.3–4.6)

## 2016-02-16 LAB — VITAMIN D 25 HYDROXY (VIT D DEFICIENCY, FRACTURES): VITD: 33.62 ng/mL (ref 30.00–100.00)

## 2016-02-16 NOTE — Patient Instructions (Addendum)
blood tests, and a 24-HR urine test, are requested for you today. Let's also check a bone density test.   We'll let you know about the results.

## 2016-02-16 NOTE — Progress Notes (Signed)
Subjective:    Patient ID: [name], female    DOB: 11-May-1956, 60 y.o.   MRN: [id number]  HPI Pt was noted to have moderate hypercalcemia in 2017 (it was normal in 2016). she has never had osteoporosis, urolithiasis, thyroid probs, parathyroid probs, sarcoidosis, cancer, PUD, pancreatitis, recent severe injury, depression, or bony fracture.  she does not take vitamin-D or A supplements.  Pt has no h/o any of these: [place], or prolonged immobilization.  Pt denies taking antacids, Li++, or HCTZ.  She has intermittent mild cramping of the feet, and assoc anxiety.   Past Medical History  Diagnosis Date  . Aortic stenosis   . Cardiomyopathy secondary   . Hyperlipidemia   . HTN (hypertension)   . Bicuspid aortic valve   . Abnormal liver function tests   . CHF (congestive heart failure) ([place])   . Benign neoplasm of colon 04/19/2001    Hyperplastic  . S/P ICD (internal cardiac defibrillator) procedure   . Hypercalcemia     Past Surgical History  Procedure Laterality Date  . Laparoscopic cholecystectomy with intraoperative cholangiogram    . Implantation of a guidant biventricular icd      Social History   Social History  . Marital Status: Single    Spouse Name: N/A  . Number of Children: 1  . Years of Education: N/A   Occupational History  . Therapist, art   .     Social History Main Topics  . Smoking status: Former Smoker -- 1.00 packs/day for 40 years    Types: Cigarettes    Quit date: 09/25/2010  . Smokeless tobacco: Never Used     Comment: none in last 15 days  . Alcohol Use: 0.0 oz/week    0 Standard drinks or equivalent per week     Comment: occasional  . Drug Use: No  . Sexual Activity: Not on file   Other Topics Concern  . Not on file   Social History Narrative    Current Outpatient Prescriptions on File Prior to Visit  Medication Sig Dispense Refill  . ALPRAZolam (XANAX) 0.25 MG tablet Take 0.25 mg by mouth at bedtime as needed for anxiety.      . carvedilol (COREG) 25 MG tablet Take 25 mg by mouth 2 (two) times daily with a meal.     . furosemide (LASIX) 40 MG tablet Take 1 tablet (40 mg) by mouth every other day. 15 tablet 11  . isosorbide-hydrALAZINE (BIDIL) 20-37.5 MG tablet Take by mouth. TAKE 1/2 TABLET BY MOUTH TWO TIMES DAILY    . Multiple Vitamin (MULTIVITAMIN) capsule Take 1 capsule by mouth daily.      Marland Kitchen olmesartan (BENICAR) 20 MG tablet Take 10 mg by mouth daily.      . rosuvastatin (CRESTOR) 10 MG tablet Take 1 tablet (10 mg total) by mouth daily. 90 tablet 3  . spironolactone (ALDACTONE) 25 MG tablet Take 1 tablet (25 mg total) by mouth daily. 90 tablet 3  . warfarin (COUMADIN) 5 MG tablet TAKES 2.5 MG 3 DAYS A WEEK AND 5 MG THE OTHER DAYS BY MOUTH. Use as directed     No current facility-administered medications on file prior to visit.    No Known Allergies  Family History  Problem Relation Age of Onset  . Pancreatic cancer Mother   . Colon cancer Maternal Aunt   . Bone cancer Brother   . Heart attack Brother   . Hypercalcemia Neg Hx     BP 134/80  mmHg  Pulse 71  Temp(Src) 98.2 F (36.8 C) (Oral)  Ht 5\' 4"  (1.626 m)  Wt 127 lb (57.607 kg)  BMI 21.79 kg/m2  SpO2 98% Review of Systems denies weight loss, galactorrhea, numbness, abdominal pain, muscle weakness, hematuria, hypoglycemia, skin rash, visual loss, sob, diarrhea, rhinorrhea, easy bruising.  She has depression.      Objective:   Physical Exam VS: see vs page GEN: no distress HEAD: head: no deformity eyes: no periorbital swelling, no proptosis external nose and ears are normal mouth: no lesion seen.  Upper and lower dental plates NECK: supple, thyroid is not enlarged CHEST WALL: no deformity.  No kyphosis.  LUNGS: clear to auscultation.  CV: reg rate and rhythm; soft systolic murmur.  ABD: abdomen is soft, nontender.  no hepatosplenomegaly.  not distended.  no hernia MUSCULOSKELETAL: muscle bulk and strength are grossly normal.  no  obvious joint swelling.  gait is normal and steady EXTEMITIES: no deformity.  no ulcer on the feet.  feet are of normal color and temp.  no edema PULSES: dorsalis pedis intact bilat.  no carotid bruit NEURO:  cn 2-12 grossly intact.   readily moves all 4's.  sensation is intact to touch on the feet SKIN:  Normal texture and temperature.  No rash or suspicious lesion is visible.   NODES:  None palpable at the neck PSYCH: alert, well-oriented.  Does not appear anxious nor depressed.    I have reviewed outside records, and summarized: Pt was noted to have elevated Ca++, and referred here.  She also reported that she does not cry, even with the death of a family member.  HTN and CHF are well-controlled.  outside test results are reviewed: Ca++=11.0 PTH=63 AP=74 Creat=0.9  CT L-spine (2007): no mention is made of osteoporosis.     Assessment & Plan:  Hypercalcemia, new, uncertain etiology.    Patient is advised the following: Patient Instructions  blood tests, and a 24-HR urine test, are requested for you today. Let's also check a bone density test.   We'll let you know about the results.

## 2016-02-17 ENCOUNTER — Other Ambulatory Visit (INDEPENDENT_AMBULATORY_CARE_PROVIDER_SITE_OTHER): Payer: Medicare Other

## 2016-02-17 LAB — TSH: TSH: 1.02 u[IU]/mL (ref 0.35–4.50)

## 2016-02-18 LAB — PTH, INTACT AND CALCIUM
Calcium: 11 mg/dL — ABNORMAL HIGH (ref 8.4–10.5)
PTH: 116 pg/mL — ABNORMAL HIGH (ref 14–64)

## 2016-02-20 LAB — VITAMIN D 1,25 DIHYDROXY
Vitamin D 1, 25 (OH)2 Total: 64 pg/mL (ref 18–72)
Vitamin D2 1, 25 (OH)2: <8 pg/mL
Vitamin D3 1, 25 (OH)2: 64 pg/mL

## 2016-02-22 LAB — PROTEIN ELECTROPHORESIS, SERUM
Albumin ELP: 4.4 g/dL (ref 3.8–4.8)
Alpha-1-Globulin: 0.3 g/dL (ref 0.2–0.3)
Alpha-2-Globulin: 0.6 g/dL (ref 0.5–0.9)
Beta 2: 0.5 g/dL (ref 0.2–0.5)
Beta Globulin: 0.5 g/dL (ref 0.4–0.6)
Gamma Globulin: 1.5 g/dL (ref 0.8–1.7)
Total Protein, Serum Electrophoresis: 7.8 g/dL (ref 6.1–8.1)

## 2016-02-22 LAB — VITAMIN A: Vitamin A (Retinoic Acid): 69 ug/dL (ref 38–98)

## 2016-02-24 NOTE — Addendum Note (Signed)
Addended by: Kaylyn Lim I on: 02/24/2016 11:55 AM   Modules accepted: Orders

## 2016-02-25 LAB — CALCIUM, URINE, 24 HOUR
Calcium, 24 hour urine: 72 mg/24 h (ref 35–250)
Calcium, Ur: 8 mg/dL

## 2016-02-28 ENCOUNTER — Ambulatory Visit (INDEPENDENT_AMBULATORY_CARE_PROVIDER_SITE_OTHER)
Admission: RE | Admit: 2016-02-28 | Discharge: 2016-02-28 | Disposition: A | Discharge status: Home / Self Care | Payer: Commercial Managed Care - HMO | Source: Ambulatory Visit | Attending: Endocrinology | Admitting: Endocrinology

## 2016-02-28 DIAGNOSIS — E213 Hyperparathyroidism, unspecified: Secondary | ICD-10-CM | POA: Diagnosis not present

## 2016-03-06 ENCOUNTER — Ambulatory Visit (INDEPENDENT_AMBULATORY_CARE_PROVIDER_SITE_OTHER): Payer: Commercial Managed Care - HMO | Admitting: *Deleted

## 2016-03-06 DIAGNOSIS — I472 Ventricular tachycardia, unspecified: Secondary | ICD-10-CM

## 2016-03-06 DIAGNOSIS — Z95810 Presence of automatic (implantable) cardiac defibrillator: Secondary | ICD-10-CM

## 2016-03-06 NOTE — Progress Notes (Signed)
Remote ICD transmission.   

## 2016-03-07 DIAGNOSIS — Z7901 Long term (current) use of anticoagulants: Secondary | ICD-10-CM | POA: Diagnosis not present

## 2016-03-13 LAB — CUP PACEART REMOTE DEVICE CHECK
Battery Remaining Longevity: 66 mo
Battery Remaining Percentage: 100 %
Brady Statistic RA Percent Paced: 0 %
Brady Statistic RV Percent Paced: 100 %
Date Time Interrogation Session: 20170612052200
HighPow Impedance: 58 Ohm
Implantable Lead Implant Date: 20060630
Implantable Lead Implant Date: 20060630
Implantable Lead Implant Date: 20060630
Implantable Lead Location: 753858
Implantable Lead Location: 753859
Implantable Lead Location: 753860
Implantable Lead Model: 157
Implantable Lead Model: 4086
Implantable Lead Model: 4543
Implantable Lead Serial Number: 119717
Implantable Lead Serial Number: 131677
Implantable Lead Serial Number: 223436
Lead Channel Impedance Value: 1209 Ohm
Lead Channel Impedance Value: 461 Ohm
Lead Channel Impedance Value: 573 Ohm
Lead Channel Pacing Threshold Amplitude: 0.7 V
Lead Channel Pacing Threshold Amplitude: 0.9 V
Lead Channel Pacing Threshold Amplitude: 1 V
Lead Channel Pacing Threshold Pulse Width: 0.4 ms
Lead Channel Pacing Threshold Pulse Width: 0.4 ms
Lead Channel Pacing Threshold Pulse Width: 0.4 ms
Lead Channel Setting Pacing Amplitude: 2 V
Lead Channel Setting Pacing Amplitude: 2.2 V
Lead Channel Setting Pacing Amplitude: 2.4 V
Lead Channel Setting Pacing Pulse Width: 0.4 ms
Lead Channel Setting Pacing Pulse Width: 0.4 ms
Lead Channel Setting Sensing Sensitivity: 0.5 mV
Lead Channel Setting Sensing Sensitivity: 1 mV
Pulse Gen Serial Number: 498164

## 2016-03-16 ENCOUNTER — Encounter: Payer: Self-pay | Admitting: Cardiology

## 2016-04-04 DIAGNOSIS — Z7901 Long term (current) use of anticoagulants: Secondary | ICD-10-CM | POA: Diagnosis not present

## 2016-05-03 DIAGNOSIS — Z7901 Long term (current) use of anticoagulants: Secondary | ICD-10-CM | POA: Diagnosis not present

## 2016-05-03 DIAGNOSIS — Z79899 Other long term (current) drug therapy: Secondary | ICD-10-CM | POA: Diagnosis not present

## 2016-05-05 DIAGNOSIS — T148 Other injury of unspecified body region: Secondary | ICD-10-CM | POA: Diagnosis not present

## 2016-05-05 DIAGNOSIS — L309 Dermatitis, unspecified: Secondary | ICD-10-CM | POA: Diagnosis not present

## 2016-05-16 DIAGNOSIS — D582 Other hemoglobinopathies: Secondary | ICD-10-CM | POA: Diagnosis not present

## 2016-05-24 DIAGNOSIS — L299 Pruritus, unspecified: Secondary | ICD-10-CM | POA: Diagnosis not present

## 2016-05-24 DIAGNOSIS — R413 Other amnesia: Secondary | ICD-10-CM | POA: Diagnosis not present

## 2016-05-24 DIAGNOSIS — R202 Paresthesia of skin: Secondary | ICD-10-CM | POA: Diagnosis not present

## 2016-05-24 DIAGNOSIS — Z7901 Long term (current) use of anticoagulants: Secondary | ICD-10-CM | POA: Diagnosis not present

## 2016-05-24 DIAGNOSIS — D751 Secondary polycythemia: Secondary | ICD-10-CM | POA: Diagnosis not present

## 2016-05-25 ENCOUNTER — Telehealth: Payer: Self-pay | Admitting: Endocrinology

## 2016-05-25 NOTE — Telephone Encounter (Signed)
please call patient: Dr Dorthy Cooler called today He found you had too many red blood cells.   Therefore, please move up appt to next available.

## 2016-05-25 NOTE — Telephone Encounter (Signed)
Transferred referring MD to Dr. Loanne Drilling

## 2016-05-25 NOTE — Telephone Encounter (Signed)
Patient scheduled for 06/06/2016 at 1 pm.

## 2016-06-03 ENCOUNTER — Other Ambulatory Visit: Payer: Self-pay | Admitting: Family Medicine

## 2016-06-03 DIAGNOSIS — R413 Other amnesia: Secondary | ICD-10-CM

## 2016-06-05 ENCOUNTER — Telehealth: Payer: Self-pay | Admitting: Cardiology

## 2016-06-05 ENCOUNTER — Ambulatory Visit (INDEPENDENT_AMBULATORY_CARE_PROVIDER_SITE_OTHER): Payer: Commercial Managed Care - HMO | Admitting: *Deleted

## 2016-06-05 DIAGNOSIS — I472 Ventricular tachycardia, unspecified: Secondary | ICD-10-CM

## 2016-06-05 NOTE — Telephone Encounter (Signed)
LMOVM reminding pt to send remote transmission.   

## 2016-06-05 NOTE — Progress Notes (Signed)
Remote ICD transmission.   

## 2016-06-06 ENCOUNTER — Ambulatory Visit (INDEPENDENT_AMBULATORY_CARE_PROVIDER_SITE_OTHER): Payer: Commercial Managed Care - HMO | Admitting: Endocrinology

## 2016-06-06 ENCOUNTER — Encounter: Payer: Self-pay | Admitting: Endocrinology

## 2016-06-06 VITALS — BP 122/80 | HR 87 | Ht 64.0 in | Wt 131.0 lb

## 2016-06-06 DIAGNOSIS — E213 Hyperparathyroidism, unspecified: Secondary | ICD-10-CM

## 2016-06-06 DIAGNOSIS — D751 Secondary polycythemia: Secondary | ICD-10-CM | POA: Diagnosis not present

## 2016-06-06 LAB — CBC WITH DIFFERENTIAL/PLATELET
Basophils Absolute: 0 10*3/uL (ref 0.0–0.1)
Basophils Relative: 0.5 % (ref 0.0–3.0)
Eosinophils Absolute: 0.1 10*3/uL (ref 0.0–0.7)
Eosinophils Relative: 1.2 % (ref 0.0–5.0)
HCT: 48.8 % — ABNORMAL HIGH (ref 36.0–46.0)
Hemoglobin: 16.2 g/dL — ABNORMAL HIGH (ref 12.0–15.0)
Lymphocytes Relative: 41 % (ref 12.0–46.0)
Lymphs Abs: 1.9 10*3/uL (ref 0.7–4.0)
MCHC: 33.3 g/dL (ref 30.0–36.0)
MCV: 91.6 fl (ref 78.0–100.0)
Monocytes Absolute: 0.5 10*3/uL (ref 0.1–1.0)
Monocytes Relative: 11.9 % (ref 3.0–12.0)
Neutro Abs: 2.1 10*3/uL (ref 1.4–7.7)
Neutrophils Relative %: 45.4 % (ref 43.0–77.0)
Platelets: 130 10*3/uL — ABNORMAL LOW (ref 150.0–400.0)
RBC: 5.33 Mil/uL — ABNORMAL HIGH (ref 3.87–5.11)
RDW: 15 % (ref 11.5–15.5)
WBC: 4.5 10*3/uL (ref 4.0–10.5)

## 2016-06-06 LAB — VITAMIN D 25 HYDROXY (VIT D DEFICIENCY, FRACTURES): VITD: 31.26 ng/mL (ref 30.00–100.00)

## 2016-06-06 LAB — IBC PANEL
Iron: 74 ug/dL (ref 42–145)
Saturation Ratios: 22.3 % (ref 20.0–50.0)
Transferrin: 237 mg/dL (ref 212.0–360.0)

## 2016-06-06 NOTE — Patient Instructions (Addendum)
blood tests are requested for you today.  We'll let you know about the results. If the calcium is mildly high, Please come back for a follow-up appointment in 6 months for recheck.  If the blood cells are high, I'll request for your to see a blood specialist.  We'll let you know about the results.

## 2016-06-06 NOTE — Progress Notes (Signed)
Subjective:    Patient ID: [name], female    DOB: 1955-10-22, 60 y.o.   MRN: [id number]  HPI Pt was noted to have primary hyperparathyroidism (dx'ed 2017 (it was normal in 2016); she has never had osteoporosis, urolithiasis, thyroid probs, prolonged immobilization, sarcoidosis, cancer, PUD, pancreatitis, recent severe injury, depression, or bony fracture; she does not take vitamin-D or A supplements;  Pt denies taking antacids, Li++, or HCTZ; these labs were normal: 24-HR urine Ca++,1,25-OH vit-D, TSH, phos, Mg++, and BMD).  She was recently found to have polycythemia.  pt states she feels well in general, except for difficulty with concentration.    Past Medical History:  Diagnosis Date  . Abnormal liver function tests   . Aortic stenosis   . Benign neoplasm of colon 04/19/2001   Hyperplastic  . Bicuspid aortic valve   . Cardiomyopathy secondary   . CHF (congestive heart failure) ([place])   . HTN (hypertension)   . Hypercalcemia   . Hyperlipidemia   . S/P ICD (internal cardiac defibrillator) procedure     Past Surgical History:  Procedure Laterality Date  . implantation of a Guidant biventricular ICD    . laparoscopic cholecystectomy with intraoperative cholangiogram      Social History   Social History  . Marital status: Single    Spouse name: N/A  . Number of children: 1  . Years of education: N/A   Occupational History  . Therapist, art   .  Unemployed   Social History Main Topics  . Smoking status: Former Smoker    Packs/day: 1.00    Years: 40.00    Types: Cigarettes    Quit date: 09/25/2010  . Smokeless tobacco: Never Used     Comment: none in last 15 days  . Alcohol use 0.0 oz/week     Comment: occasional  . Drug use: No  . Sexual activity: Not on file   Other Topics Concern  . Not on file   Social History Narrative  . No narrative on file    Current Outpatient Prescriptions on File Prior to Visit  Medication Sig Dispense Refill  .  carvedilol (COREG) 25 MG tablet Take 25 mg by mouth 2 (two) times daily with a meal.     . furosemide (LASIX) 40 MG tablet Take 1 tablet (40 mg) by mouth every other day. 15 tablet 11  . isosorbide-hydrALAZINE (BIDIL) 20-37.5 MG tablet Take by mouth. TAKE 1/2 TABLET BY MOUTH TWO TIMES DAILY    . Multiple Vitamin (MULTIVITAMIN) capsule Take 1 capsule by mouth daily.      Marland Kitchen olmesartan (BENICAR) 20 MG tablet Take 10 mg by mouth daily.      . rosuvastatin (CRESTOR) 10 MG tablet Take 1 tablet (10 mg total) by mouth daily. 90 tablet 3  . spironolactone (ALDACTONE) 25 MG tablet Take 1 tablet (25 mg total) by mouth daily. 90 tablet 3  . warfarin (COUMADIN) 5 MG tablet TAKES 2.5 MG 3 DAYS A WEEK AND 5 MG THE OTHER DAYS BY MOUTH. Use as directed     No current facility-administered medications on file prior to visit.     No Known Allergies  Family History  Problem Relation Age of Onset  . Pancreatic cancer Mother   . Colon cancer Maternal Aunt   . Bone cancer Brother   . Heart attack Brother   . Hypercalcemia Neg Hx     BP 122/80   Pulse 87   Ht 5\' 4"  (1.626 m)  Wt 131 lb (59.4 kg)   SpO2 99%   BMI 22.49 kg/m   Review of Systems Denies numbness and sob, but she has depression.      Objective:   Physical Exam VITAL SIGNS:  See vs page.  GENERAL: no distress. head: no deformity  eyes: no periorbital swelling, no proptosis  external nose and ears are normal  NECK: There is no palpable thyroid enlargement.  No thyroid nodule is palpable.  No palpable lymphadenopathy at the anterior neck.  Chest wall: no kyphosis.  Ext: no edema.  MSK: gait is normal and steady PSYCH: Alert and well-oriented.  Does not appear anxious nor depressed.    Lab Results  Component Value Date   PTH 116 (H) 02/16/2016   CALCIUM 11.0 (H) 02/16/2016   PHOS 2.5 02/16/2016   Lab Results  Component Value Date   WBC 4.5 06/06/2016   HGB 16.2 (H) 06/06/2016   HCT 48.8 (H) 06/06/2016   MCV 91.6 06/06/2016     PLT 130.0 (L) 06/06/2016      Assessment & Plan:  Primary hyperparathyroidism: doesn't meet criteria for surgery; Polycythemia: I am unaware of any connection between this and PTH.  Ref hematol

## 2016-06-07 ENCOUNTER — Telehealth: Payer: Self-pay | Admitting: Hematology

## 2016-06-07 ENCOUNTER — Encounter: Payer: Self-pay | Admitting: Hematology

## 2016-06-07 LAB — PTH, INTACT AND CALCIUM
Calcium: 10.4 mg/dL (ref 8.6–10.4)
PTH: 137 pg/mL — ABNORMAL HIGH (ref 14–64)

## 2016-06-07 NOTE — Telephone Encounter (Signed)
Pt confirmed appt, completed intake, contacted referring provider with date/time of appt, gave info to obtain prior autho for appt (lt mess), requested autho be faxed and provided number, mailed pt letter.

## 2016-06-08 ENCOUNTER — Encounter: Payer: Self-pay | Admitting: Cardiology

## 2016-06-13 ENCOUNTER — Ambulatory Visit (HOSPITAL_COMMUNITY): Payer: Commercial Managed Care - HMO | Attending: Cardiology

## 2016-06-13 ENCOUNTER — Other Ambulatory Visit: Payer: Self-pay

## 2016-06-13 DIAGNOSIS — I340 Nonrheumatic mitral (valve) insufficiency: Secondary | ICD-10-CM | POA: Diagnosis not present

## 2016-06-13 DIAGNOSIS — I350 Nonrheumatic aortic (valve) stenosis: Secondary | ICD-10-CM | POA: Diagnosis not present

## 2016-06-13 DIAGNOSIS — I447 Left bundle-branch block, unspecified: Secondary | ICD-10-CM | POA: Diagnosis not present

## 2016-06-13 DIAGNOSIS — I509 Heart failure, unspecified: Secondary | ICD-10-CM | POA: Insufficient documentation

## 2016-06-13 DIAGNOSIS — E785 Hyperlipidemia, unspecified: Secondary | ICD-10-CM | POA: Insufficient documentation

## 2016-06-13 DIAGNOSIS — Z87891 Personal history of nicotine dependence: Secondary | ICD-10-CM | POA: Insufficient documentation

## 2016-06-13 DIAGNOSIS — I071 Rheumatic tricuspid insufficiency: Secondary | ICD-10-CM | POA: Insufficient documentation

## 2016-06-14 DIAGNOSIS — Z7901 Long term (current) use of anticoagulants: Secondary | ICD-10-CM | POA: Diagnosis not present

## 2016-06-16 ENCOUNTER — Ambulatory Visit (INDEPENDENT_AMBULATORY_CARE_PROVIDER_SITE_OTHER): Payer: Commercial Managed Care - HMO | Admitting: Cardiovascular Disease

## 2016-06-16 ENCOUNTER — Encounter: Payer: Self-pay | Admitting: *Deleted

## 2016-06-16 VITALS — BP 130/60 | HR 72 | Ht 64.0 in | Wt 129.8 lb

## 2016-06-16 DIAGNOSIS — I350 Nonrheumatic aortic (valve) stenosis: Secondary | ICD-10-CM | POA: Diagnosis not present

## 2016-06-16 NOTE — Progress Notes (Signed)
Multi-disciplinary Valve Clinic  Chief Complaint  Patient presents with  . Chronic Systolic Heart Failure    f/u  . Aortic Valve Disorder    f/u   Referring: Cristopher Peru  History of Present Illness: 60 yo female with history of non-ischemic cardiomyopathy s/p ICD with resolution of LV function, chronic systolic CHF, severe aortic stenosis, HLD, HTN who is here today for follow up of her aortic stenosis. I met her in March 2017. She was referred to the valve clinic by Dr. Lovena Le for evaluation of aortic stenosis. She is followed by Dr. Lovena Le for her non-ischemic cardiomyopathy and ICD. Echo 11/02/15 with normal LV systolic function, severe aortic valve stenosis with mean gradient of 51 mm Hg. She was asymptomatic at that time. Repeat echo September 2017 with normal LV function, severe AS with mean gradient 47 mmHg, peak gradient 86 mmHg, AVA 0.79cm2, mild AI, mild MR.   She is here today for follow up. She tells me today that she does not feel well. She denies chest pain or dyspnea. She does endorse dizziness when first standing. No syncope. No weight gain or lower extremity edema. She is on chronic coumadin therapy. She was told this was for her cardiomyopathy. She has had no bleeding issues. She is an active person but has been moving slower lately due to fatigue.    Primary Care Physician: Lujean Amel, MD   Past Medical History:  Diagnosis Date  . Abnormal liver function tests   . Aortic stenosis   . Benign neoplasm of colon 04/19/2001   Hyperplastic  . Bicuspid aortic valve   . Cardiomyopathy secondary   . CHF (congestive heart failure) ([place])   . HTN (hypertension)   . Hypercalcemia   . Hyperlipidemia   . S/P ICD (internal cardiac defibrillator) procedure     Past Surgical History:  Procedure Laterality Date  . implantation of a Guidant biventricular ICD    . laparoscopic cholecystectomy with intraoperative cholangiogram      Current Outpatient Prescriptions  Medication  Sig Dispense Refill  . carvedilol (COREG) 25 MG tablet Take 25 mg by mouth 2 (two) times daily with a meal.     . furosemide (LASIX) 40 MG tablet Take 1 tablet (40 mg) by mouth every other day. 15 tablet 11  . isosorbide-hydrALAZINE (BIDIL) 20-37.5 MG tablet Take by mouth. TAKE 1/2 TABLET BY MOUTH TWO TIMES DAILY    . Multiple Vitamin (MULTIVITAMIN) capsule Take 1 capsule by mouth daily.      Marland Kitchen olmesartan (BENICAR) 20 MG tablet Take 10 mg by mouth daily.      . rosuvastatin (CRESTOR) 10 MG tablet Take 1 tablet (10 mg total) by mouth daily. 90 tablet 3  . spironolactone (ALDACTONE) 25 MG tablet Take 1 tablet (25 mg total) by mouth daily. 90 tablet 3  . warfarin (COUMADIN) 5 MG tablet TAKES 2.5 MG 3 DAYS A WEEK AND 5 MG THE OTHER DAYS BY MOUTH. Use as directed     No current facility-administered medications for this visit.     No Known Allergies  Social History   Social History  . Marital status: Single    Spouse name: N/A  . Number of children: 1  . Years of education: N/A   Occupational History  . Therapist, art   .  Unemployed   Social History Main Topics  . Smoking status: Former Smoker    Packs/day: 1.00    Years: 40.00    Types: Cigarettes    Quit  date: 09/25/2010  . Smokeless tobacco: Never Used     Comment: none in last 15 days  . Alcohol use 0.0 oz/week     Comment: occasional  . Drug use: No  . Sexual activity: Not on file   Other Topics Concern  . Not on file   Social History Narrative  . No narrative on file    Family History  Problem Relation Age of Onset  . Pancreatic cancer Mother   . Colon cancer Maternal Aunt   . Bone cancer Brother   . Heart attack Brother   . Hypercalcemia Neg Hx     Review of Systems:  As stated in the HPI and otherwise negative.   BP 130/60   Pulse 72   Ht _0  (1.626 m)   Wt 58.9 kg (129 lb 12.8 oz)   SpO2 99%   BMI 22.28 kg/m   Physical Examination: General: Well developed, well nourished, NAD  HEENT: OP  clear, mucus membranes moist  SKIN: warm, dry. No rashes. Neuro: No focal deficits  Musculoskeletal: Muscle strength 5/5 all ext  Psychiatric: Mood and affect normal  Neck: No JVD, no carotid bruits, no thyromegaly, no lymphadenopathy.  Lungs:Clear bilaterally, no wheezes, rhonci, crackles Cardiovascular: Regular rate and rhythm. Loud harsh systolic murmur. No gallops or rubs. Abdomen:Soft. Bowel sounds present. Non-tender.  Extremities: No lower extremity edema. Pulses are 2 + in the bilateral DP/PT.  Echo 06/13/16: Left ventricle: The cavity size was normal. Wall thickness was   increased in a pattern of moderate LVH. Systolic function was   normal. The estimated ejection fraction was in the range of 55%   to 60%. Wall motion was normal; there were no regional wall   motion abnormalities. Doppler parameters are consistent with   abnormal left ventricular relaxation (grade 1 diastolic   dysfunction). Aortic valve:   Trileaflet; severely calcified leaflets. Valve mobility was restricted.  Doppler:   There was severe stenosis. There was mild regurgitation.    VTI ratio of LVOT to aortic valve: 0.23. Valve area (VTI): 0.74 cm^2. Indexed valve area (VTI): 0.45 cm^2/m^2. Peak velocity ratio of LVOT to aortic valve: 0.25. Valve area (Vmax): 0.79 cm^2. Indexed valve area (Vmax): 0.48 cm^2/m^2. Mean velocity ratio of LVOT to aortic valve: 0.23. Valve area (Vmean): 0.71 cm^2. Indexed valve area (Vmean): 0.43 cm^2/m^2. Mean gradient (S): 47 mm Hg. Peak gradient (S): 86 mm Hg. - Mitral valve: Calcified annulus. There was mild regurgitation. - Left atrium: The atrium was mildly dilated.  Impressions:  - Normal LV systolic function; moderate LVH; grade 1 diastolic   dysfunction; severely calcified aortic valve with severe AS; mild   AI; mild MR; mild LAE; trace TR.   EKG:  EKG is not  ordered today.  Recent Labs: 02/16/2016: Magnesium 1.9 02/17/2016: TSH 1.02 06/06/2016: Hemoglobin 16.2;  Platelets 130.0    Wt Readings from Last 3 Encounters:  06/16/16 58.9 kg (129 lb 12.8 oz)  06/06/16 59.4 kg (131 lb)  02/16/16 57.6 kg (127 lb)    Other studies Reviewed: Additional studies/ records that were reviewed today include: Echo images. I personally reviewed. . Review of the above records demonstrates: severe AS  STS Risk Score:  Risk of Mortality: 1.231%  Morbidity or Mortality: 10.963%  Long Length of Stay: 3.462%  Short Length of Stay: 51.122%  Permanent Stroke: 0.987%  Prolonged Ventilation: 5.904%  DSW Infection: 0.075%  Renal Failure: 1.635%  Reoperation: 6.114%    Assessment and Plan:   1.  Aortic stenosis: She has severe aortic valve stenosis. Her aortic valve is thickened and calcified. I have personally reviewed her echo images from this month. She most likely has a bicuspid valve. She is now symptomatic. I think AVR is indicated. I think she will likely be treated with a surgical AVR as her risk is low (STS risk score 1.2). I have reviewed in detail today with the patient the etiology of aortic stenosis, symptoms and treatment options. I will arrange a right and left heart catheterization at [facility] on 07/05/16. Will check pre-cath labs the week of the procedure. Risks and benefits of cath reviewed with pt. She will hold coumadin 5 days before the cath. She has no cardiac indication for coumadin. I will route this to her primary care doctor. If there is no other reason for coumadin, this can be stopped long term.  She will call her primary care doctor to discuss this issue.   -I will refer her to CT surgery following her cath to discuss surgical AVR  Current medicines are reviewed at length with the patient today.  The patient does not have concerns regarding medicines.  The following changes have been made:  no change  Labs/ tests ordered today include:   Orders Placed This Encounter  Procedures  . Basic Metabolic Panel (BMET)  . CBC w/Diff  . INR/PT     Disposition:   FU with me in 6 months  Signed, Lauree Chandler, MD 06/16/2016 10:41 AM    [facility] [address], [place], [place]  [address] Phone: [phone]; Fax: [phone]

## 2016-06-16 NOTE — Patient Instructions (Signed)
Medication Instructions:  Your physician recommends that you continue on your current medications as directed. Please refer to the Current Medication list given to you today.   Labwork: Your physician recommends that you return for lab work on October 9,2017. The lab opens at  7:30 AM.  You do not have to fast for this lab work.    Testing/Procedures: Your physician has requested that you have a cardiac catheterization. Cardiac catheterization is used to diagnose and/or treat various heart conditions. Doctors may recommend this procedure for a number of different reasons. The most common reason is to evaluate chest pain. Chest pain can be a symptom of coronary artery disease (CAD), and cardiac catheterization can show whether plaque is narrowing or blocking your heart's arteries. This procedure is also used to evaluate the valves, as well as measure the blood flow and oxygen levels in different parts of your heart. For further information please visit [email or web address]. Please follow instruction sheet, as given.    Follow-Up: To be determined after catheterization.   Any Other Special Instructions Will Be Listed Below (If Applicable).     If you need a refill on your cardiac medications before your next appointment, please call your pharmacy.

## 2016-06-16 NOTE — Addendum Note (Signed)
Addended by: Thompson Grayer on: 06/16/2016 10:49 AM   Modules accepted: Orders

## 2016-06-20 LAB — CUP PACEART REMOTE DEVICE CHECK
Battery Remaining Longevity: 66 mo
Battery Remaining Percentage: 100 %
Brady Statistic RA Percent Paced: 0 %
Brady Statistic RV Percent Paced: 100 %
Date Time Interrogation Session: 20170911190100
HighPow Impedance: 61 Ohm
Implantable Lead Implant Date: 20060630
Implantable Lead Implant Date: 20060630
Implantable Lead Implant Date: 20060630
Implantable Lead Location: 753858
Implantable Lead Location: 753859
Implantable Lead Location: 753860
Implantable Lead Model: 157
Implantable Lead Model: 4086
Implantable Lead Model: 4543
Implantable Lead Serial Number: 119717
Implantable Lead Serial Number: 131677
Implantable Lead Serial Number: 223436
Lead Channel Impedance Value: 1332 Ohm
Lead Channel Impedance Value: 481 Ohm
Lead Channel Impedance Value: 605 Ohm
Lead Channel Pacing Threshold Amplitude: 0.7 V
Lead Channel Pacing Threshold Amplitude: 0.9 V
Lead Channel Pacing Threshold Amplitude: 1 V
Lead Channel Pacing Threshold Pulse Width: 0.4 ms
Lead Channel Pacing Threshold Pulse Width: 0.4 ms
Lead Channel Pacing Threshold Pulse Width: 0.4 ms
Lead Channel Setting Pacing Amplitude: 2 V
Lead Channel Setting Pacing Amplitude: 2.2 V
Lead Channel Setting Pacing Amplitude: 2.4 V
Lead Channel Setting Pacing Pulse Width: 0.4 ms
Lead Channel Setting Pacing Pulse Width: 0.4 ms
Lead Channel Setting Sensing Sensitivity: 0.5 mV
Lead Channel Setting Sensing Sensitivity: 1 mV
Pulse Gen Serial Number: 498164

## 2016-06-21 ENCOUNTER — Encounter: Payer: Self-pay | Admitting: Endocrinology

## 2016-06-21 ENCOUNTER — Ambulatory Visit (INDEPENDENT_AMBULATORY_CARE_PROVIDER_SITE_OTHER): Payer: Commercial Managed Care - HMO | Admitting: Endocrinology

## 2016-06-21 VITALS — BP 116/78 | HR 78 | Ht 64.0 in | Wt 131.0 lb

## 2016-06-21 DIAGNOSIS — E213 Hyperparathyroidism, unspecified: Secondary | ICD-10-CM | POA: Diagnosis not present

## 2016-06-21 NOTE — Progress Notes (Signed)
Subjective:    Patient ID: [name], female    DOB: November 07, 1955, 60 y.o.   MRN: [id number]  HPI Pt returns for f/u of primary hyperparathyroidism (dx'ed 2017 (it was normal in 2016); she has never had osteoporosis, urolithiasis, thyroid probs, prolonged immobilization, sarcoidosis, cancer, PUD, pancreatitis, recent severe injury, depression, or bony fracture; she does not take vitamin-D or A supplements;  Pt denies taking antacids, Li++, or HCTZ; these labs were normal: 24-HR urine Ca++,1,25-OH vit-D, TSH, phos, Mg++, and BMD).  pt reports fatigue, difficulty with concentration, and anxiety.   Past Medical History:  Diagnosis Date  . Abnormal liver function tests   . Aortic stenosis   . Benign neoplasm of colon 04/19/2001   Hyperplastic  . Bicuspid aortic valve   . Cardiomyopathy secondary   . CHF (congestive heart failure) ([place])   . HTN (hypertension)   . Hypercalcemia   . Hyperlipidemia   . S/P ICD (internal cardiac defibrillator) procedure     Past Surgical History:  Procedure Laterality Date  . implantation of a Guidant biventricular ICD    . laparoscopic cholecystectomy with intraoperative cholangiogram      Social History   Social History  . Marital status: Single    Spouse name: N/A  . Number of children: 1  . Years of education: N/A   Occupational History  . Therapist, art   .  Unemployed   Social History Main Topics  . Smoking status: Former Smoker    Packs/day: 1.00    Years: 40.00    Types: Cigarettes    Quit date: 09/25/2010  . Smokeless tobacco: Never Used     Comment: none in last 15 days  . Alcohol use 0.0 oz/week     Comment: occasional  . Drug use: No  . Sexual activity: Not on file   Other Topics Concern  . Not on file   Social History Narrative  . No narrative on file    Current Outpatient Prescriptions on File Prior to Visit  Medication Sig Dispense Refill  . carvedilol (COREG) 25 MG tablet Take 25 mg by mouth 2 (two) times  daily with a meal.     . furosemide (LASIX) 40 MG tablet Take 1 tablet (40 mg) by mouth every other day. 15 tablet 11  . isosorbide-hydrALAZINE (BIDIL) 20-37.5 MG tablet Take by mouth. TAKE 1/2 TABLET BY MOUTH TWO TIMES DAILY    . Multiple Vitamin (MULTIVITAMIN) capsule Take 1 capsule by mouth daily.      Marland Kitchen olmesartan (BENICAR) 20 MG tablet Take 10 mg by mouth daily.      . rosuvastatin (CRESTOR) 10 MG tablet Take 1 tablet (10 mg total) by mouth daily. 90 tablet 3  . spironolactone (ALDACTONE) 25 MG tablet Take 1 tablet (25 mg total) by mouth daily. 90 tablet 3  . warfarin (COUMADIN) 5 MG tablet TAKES 2.5 MG 3 DAYS A WEEK AND 5 MG THE OTHER DAYS BY MOUTH. Use as directed     No current facility-administered medications on file prior to visit.     No Known Allergies  Family History  Problem Relation Age of Onset  . Pancreatic cancer Mother   . Colon cancer Maternal Aunt   . Bone cancer Brother   . Heart attack Brother   . Hypercalcemia Neg Hx     BP 116/78   Pulse 78   Ht 5\' 4"  (1.626 m)   Wt 131 lb (59.4 kg)   SpO2 97%  BMI 22.49 kg/m   Review of Systems She has slight numbness of the feet.      Objective:   Physical Exam VITAL SIGNS:  See vs page GENERAL: no distress. Ext: no edema.    Lab Results  Component Value Date   PTH 142 (H) 06/21/2016   CALCIUM 10.3 06/21/2016   PHOS 2.5 02/16/2016       Assessment & Plan:  Primary hyperparathyroidism: no current indication for surgery

## 2016-06-21 NOTE — Patient Instructions (Addendum)
blood tests are requested for you today.  We'll let you know about the results.  Please come back for a follow-up appointment in 6 months for recheck.

## 2016-06-22 LAB — PTH, INTACT AND CALCIUM
Calcium: 10.3 mg/dL (ref 8.6–10.4)
PTH: 142 pg/mL — ABNORMAL HIGH (ref 14–64)

## 2016-06-30 ENCOUNTER — Telehealth: Payer: Self-pay

## 2016-06-30 NOTE — Telephone Encounter (Signed)
Pt called, stated she was confused about her appts for next week and inquired why she was seeing Dr. Irene Limbo. Informed her she was referred for polycythemia by her PCP. Pt verbalized understanding and remembered talking about that, states she just has so many appts next week she wanted to make sure she knew what was going on. Informed pt to call if she had any more questions or concerns.

## 2016-07-03 ENCOUNTER — Other Ambulatory Visit: Payer: Commercial Managed Care - HMO | Admitting: *Deleted

## 2016-07-03 DIAGNOSIS — I350 Nonrheumatic aortic (valve) stenosis: Secondary | ICD-10-CM

## 2016-07-03 LAB — CBC WITH DIFFERENTIAL/PLATELET
Basophils Absolute: 42 cells/uL (ref 0–200)
Basophils Relative: 1 %
Eosinophils Absolute: 42 cells/uL (ref 15–500)
Eosinophils Relative: 1 %
HCT: 46.2 % — ABNORMAL HIGH (ref 35.0–45.0)
Hemoglobin: 15.1 g/dL (ref 11.7–15.5)
Lymphocytes Relative: 47 %
Lymphs Abs: 1974 cells/uL (ref 850–3900)
MCH: 30.3 pg (ref 27.0–33.0)
MCHC: 32.7 g/dL (ref 32.0–36.0)
MCV: 92.8 fL (ref 80.0–100.0)
MPV: 10.6 fL (ref 7.5–12.5)
Monocytes Absolute: 504 cells/uL (ref 200–950)
Monocytes Relative: 12 %
Neutro Abs: 1638 cells/uL (ref 1500–7800)
Neutrophils Relative %: 39 %
Platelets: 168 10*3/uL (ref 140–400)
RBC: 4.98 MIL/uL (ref 3.80–5.10)
RDW: 15 % (ref 11.0–15.0)
WBC: 4.2 10*3/uL (ref 3.8–10.8)

## 2016-07-03 LAB — BASIC METABOLIC PANEL
BUN: 14 mg/dL (ref 7–25)
CO2: 22 mmol/L (ref 20–31)
Calcium: 10.1 mg/dL (ref 8.6–10.4)
Chloride: 105 mmol/L (ref 98–110)
Creat: 0.95 mg/dL (ref 0.50–0.99)
Glucose, Bld: 116 mg/dL — ABNORMAL HIGH (ref 65–99)
Potassium: 4.2 mmol/L (ref 3.5–5.3)
Sodium: 138 mmol/L (ref 135–146)

## 2016-07-03 LAB — PROTIME-INR
INR: 1
Prothrombin Time: 10.3 s (ref 9.0–11.5)

## 2016-07-04 ENCOUNTER — Ambulatory Visit (HOSPITAL_BASED_OUTPATIENT_CLINIC_OR_DEPARTMENT_OTHER): Payer: Commercial Managed Care - HMO | Admitting: Hematology

## 2016-07-04 ENCOUNTER — Encounter: Payer: Self-pay | Admitting: Hematology

## 2016-07-04 VITALS — BP 109/63 | HR 92 | Temp 99.0°F | Resp 18 | Wt 132.3 lb

## 2016-07-04 DIAGNOSIS — D751 Secondary polycythemia: Secondary | ICD-10-CM | POA: Diagnosis not present

## 2016-07-04 NOTE — Progress Notes (Signed)
Marland Kitchen    HEMATOLOGY/ONCOLOGY CONSULTATION NOTE  Date of Service: 07/04/2016  Patient Care Team: Dibas Koirala, MD as PCP - General (Family Medicine)  CHIEF COMPLAINTS/PURPOSE OF CONSULTATION:  Polycythemia  HISTORY OF PRESENTING ILLNESS:   [name] is a wonderful 60 y.o. female who has been referred to [place] by Dr .Dorthy Cooler, Dib[place], MD for evaluation and management of Polycythemia.  Patient has multiple medical problems as noted below including hypertension, dyslipidemia, CHF, aortic stenosis, hyperparathyroidism, but difficult to disease, pancreatitis, depression who was referred to Dr. Dorthy Cooler for polycythemia.  Patient had labs on 06/06/2016 that showed a slightly elevated hemoglobin of 16.2 with hematocrit of 48.8 with a normal WBC count of 4.5k and mild thrombocytopenic with platelets of 130k.  She had repeat labs done on 07/03/2016 that showed normalization of her CBC with resolution of her thrombocytopenia with platelets of 168k and hemoglobin of 15.1 and a hematocrit of 46.2.  Rapid normalization of counts suggest that the previous mild polycythemia was likely related to dehydration or hemoconcentration possibly from her diuretics.  Patient is a previous smoker and smoked 1 pack per day for 40 years but quit in 2012.   she has had issues with fatigue memory loss and decreased appetite which appears to be a function of her multiple other medical comorbidities.      MEDICAL HISTORY:  Past Medical History:  Diagnosis Date  . Abnormal liver function tests   . Aortic stenosis   . Benign neoplasm of colon 04/19/2001   Hyperplastic  . Bicuspid aortic valve   . Cardiomyopathy secondary   . CHF (congestive heart failure) ([place])   . HTN (hypertension)   . Hypercalcemia   . Hyperlipidemia   . S/P ICD (internal cardiac defibrillator) procedure     SURGICAL HISTORY: Past Surgical History:  Procedure Laterality Date  . implantation of a Guidant biventricular ICD    .  laparoscopic cholecystectomy with intraoperative cholangiogram      SOCIAL HISTORY: Social History   Social History  . Marital status: Single    Spouse name: N/A  . Number of children: 1  . Years of education: N/A   Occupational History  . Therapist, art   .  Unemployed   Social History Main Topics  . Smoking status: Former Smoker    Packs/day: 1.00    Years: 40.00    Types: Cigarettes    Quit date: 09/25/2010  . Smokeless tobacco: Never Used     Comment: none in last 15 days  . Alcohol use 0.0 oz/week     Comment: occasional  . Drug use: No  . Sexual activity: Not on file   Other Topics Concern  . Not on file   Social History Narrative  . No narrative on file    FAMILY HISTORY: Family History  Problem Relation Age of Onset  . Pancreatic cancer Mother   . Colon cancer Maternal Aunt   . Bone cancer Brother   . Heart attack Brother   . Hypercalcemia Neg Hx     ALLERGIES:  has No Known Allergies.  MEDICATIONS:  Current Outpatient Prescriptions  Medication Sig Dispense Refill  . acetaminophen (TYLENOL) 325 MG tablet Take 325 mg by mouth every 6 (six) hours as needed for mild pain.    . carvedilol (COREG) 25 MG tablet Take 25 mg by mouth 2 (two) times daily with a meal.     . furosemide (LASIX) 40 MG tablet Take 1 tablet (40 mg) by mouth every other  day. (Patient taking differently: Take 40 mg by mouth every other day. Take 1 tablet (40 mg) by mouth every other day.) 15 tablet 11  . isosorbide-hydrALAZINE (BIDIL) 20-37.5 MG tablet Take 0.5 tablets by mouth 2 (two) times daily. TAKE 1/2 TABLET BY MOUTH TWO TIMES DAILY     . Multiple Vitamin (MULTIVITAMIN) capsule Take 1 capsule by mouth daily.      Marland Kitchen olmesartan (BENICAR) 20 MG tablet Take 20 mg by mouth daily.     . rosuvastatin (CRESTOR) 10 MG tablet Take 1 tablet (10 mg total) by mouth daily. 90 tablet 3  . spironolactone (ALDACTONE) 25 MG tablet Take 1 tablet (25 mg total) by mouth daily. 90 tablet 3   No  current facility-administered medications for this visit.     REVIEW OF SYSTEMS:    10 Point review of Systems was done is negative except as noted above.  PHYSICAL EXAMINATION: ECOG PERFORMANCE STATUS: 2 - Symptomatic, <50% confined to bed  . Vitals:   07/04/16 1406  BP: 109/63  Pulse: 92  Resp: 18  Temp: 99 F (37.2 C)   Filed Weights   07/04/16 1406  Weight: 132 lb 4.8 oz (60 kg)   .Body mass index is 22.71 kg/m.  GENERAL:alert, in no acute distress and comfortable SKIN: skin color, texture, turgor are normal, no rashes or significant lesions EYES: normal, conjunctiva are pink and non-injected, sclera clear OROPHARYNX:no exudate, no erythema and lips, buccal mucosa, and tongue normal  NECK: supple, no JVD, thyroid normal size, non-tender, without nodularity LYMPH:  no palpable lymphadenopathy in the cervical, axillary or inguinal LUNGS: clear to auscultation with normal respiratory effort HEART: regular rate & rhythm,  no murmurs and no lower extremity edema ABDOMEN: abdomen soft, non-tender, normoactive bowel sounds  Musculoskeletal: no cyanosis of digits and no clubbing  PSYCH: alert & oriented x 3 with fluent speech NEURO: no focal motor/sensory deficits  LABORATORY DATA:  I have reviewed the data as listed  . CBC Latest Ref Rng & Units 07/03/2016 06/06/2016 10/18/2010  WBC 3.8 - 10.8 K/uL 4.2 4.5 5.1  Hemoglobin 11.7 - 15.5 g/dL 15.1 16.2(H) 15.0  Hematocrit 35.0 - 45.0 % 46.2(H) 48.8(H) 44.9  Platelets 140 - 400 K/uL 168 130.0(L) 113.0(L)    . CMP Latest Ref Rng & Units 07/03/2016 06/21/2016 06/06/2016  Glucose 65 - 99 mg/dL 116(H) - -  BUN 7 - 25 mg/dL 14 - -  Creatinine 0.50 - 0.99 mg/dL 0.95 - -  Sodium 135 - 146 mmol/L 138 - -  Potassium 3.5 - 5.3 mmol/L 4.2 - -  Chloride 98 - 110 mmol/L 105 - -  CO2 20 - 31 mmol/L 22 - -  Calcium 8.6 - 10.4 mg/dL 10.1 10.3 10.4     RADIOGRAPHIC STUDIES: I have personally reviewed the radiological images as listed  and agreed with the findings in the report. No results found.  ASSESSMENT & PLAN:   60 year old female with   #1 transient borderline polycythemia with hemoglobin of 16.2/hematocrit of 48.8. This appears to be secondary to hemoconcentration likely due to her diuretics. On repeat labs done yesterday on 07/13/2016 the patient's polycythemia has resolved and her hemoglobin and hematocrit are down to 15.1 and 46.2 with a normal platelet count of 168k.  Plan -Patient's polycythemia has resolved. This patient much rules out the possibility of polycythemia vera. -No additional workup and recommended for the patient's polycythemia at this time which appeared to be due to hemoconcentration.  #2  Patient  Active Problem List   Diagnosis Date Noted  . S/P AVR (aortic valve replacement) 12/12/2016  . Aortic stenosis 11/16/2016  . Severe aortic stenosis   . Polycythemia 06/06/2016  . Menopausal state 02/16/2016  . Hyperparathyroidism ([place]) 02/16/2016  . Chronic systolic heart failure ([place]) 05/07/2014  . Hx of colonic polyps 06/17/2013  . Ventricular tachycardia ([place]) 02/21/2011  . Automatic implantable cardioverter-defibrillator in situ 06/02/2009  . HYPERLIPIDEMIA 05/29/2009  . SMOKER 05/29/2009  . Essential hypertension 05/29/2009  . Aortic valve disorder 05/29/2009  . CARDIOMYOPATHY, SECONDARY 05/29/2009  . CHF 05/29/2009  . BICUSPID AORTIC VALVE 05/29/2009  . LIVER FUNCTION TESTS, ABNORMAL, HX OF 05/29/2009  . CHOLECYSTITIS, HX OF 05/29/2009   -Continue follow-up with primary care physician for management of other medical comorbidities.  Please call if any new questions or concerns arise  All of the patients questions were answered with apparent satisfaction. The patient knows to call the clinic with any problems, questions or concerns.  I spent 30 minutes counseling the patient face to face. The total time spent in the appointment was 40 minutes and more than 50% was on counseling  and direct patient cares.    Sullivan Lone MD [place] AAHIVMS [facility] Hematology/Oncology Physician [facility]  (Office):       [phone] (Work cell):  [phone] (Fax):           [phone]  07/04/2016 2:19 PM

## 2016-07-05 ENCOUNTER — Ambulatory Visit (HOSPITAL_COMMUNITY)
Admission: RE | Admit: 2016-07-05 | Discharge: 2016-07-05 | Disposition: A | Discharge status: Home / Self Care | Payer: Commercial Managed Care - HMO | Source: Ambulatory Visit | Attending: Cardiovascular Disease | Admitting: Cardiovascular Disease

## 2016-07-05 ENCOUNTER — Encounter (HOSPITAL_COMMUNITY): Payer: Self-pay | Admitting: Cardiovascular Disease

## 2016-07-05 ENCOUNTER — Other Ambulatory Visit: Payer: Self-pay

## 2016-07-05 ENCOUNTER — Encounter (HOSPITAL_COMMUNITY)
Admission: RE | Disposition: A | Discharge status: Home / Self Care | Payer: Self-pay | Source: Ambulatory Visit | Attending: Cardiovascular Disease

## 2016-07-05 DIAGNOSIS — Z8249 Family history of ischemic heart disease and other diseases of the circulatory system: Secondary | ICD-10-CM | POA: Diagnosis not present

## 2016-07-05 DIAGNOSIS — I5022 Chronic systolic (congestive) heart failure: Secondary | ICD-10-CM | POA: Insufficient documentation

## 2016-07-05 DIAGNOSIS — Z7901 Long term (current) use of anticoagulants: Secondary | ICD-10-CM | POA: Insufficient documentation

## 2016-07-05 DIAGNOSIS — I2510 Atherosclerotic heart disease of native coronary artery without angina pectoris: Secondary | ICD-10-CM | POA: Diagnosis not present

## 2016-07-05 DIAGNOSIS — I110 Hypertensive heart disease with heart failure: Secondary | ICD-10-CM | POA: Diagnosis not present

## 2016-07-05 DIAGNOSIS — Z87891 Personal history of nicotine dependence: Secondary | ICD-10-CM | POA: Insufficient documentation

## 2016-07-05 DIAGNOSIS — I082 Rheumatic disorders of both aortic and tricuspid valves: Secondary | ICD-10-CM | POA: Insufficient documentation

## 2016-07-05 DIAGNOSIS — Z800 Family history of malignant neoplasm of digestive organs: Secondary | ICD-10-CM | POA: Insufficient documentation

## 2016-07-05 DIAGNOSIS — I350 Nonrheumatic aortic (valve) stenosis: Secondary | ICD-10-CM | POA: Diagnosis not present

## 2016-07-05 DIAGNOSIS — E785 Hyperlipidemia, unspecified: Secondary | ICD-10-CM | POA: Diagnosis not present

## 2016-07-05 DIAGNOSIS — Z808 Family history of malignant neoplasm of other organs or systems: Secondary | ICD-10-CM | POA: Insufficient documentation

## 2016-07-05 DIAGNOSIS — Z95810 Presence of automatic (implantable) cardiac defibrillator: Secondary | ICD-10-CM | POA: Diagnosis not present

## 2016-07-05 DIAGNOSIS — I428 Other cardiomyopathies: Secondary | ICD-10-CM | POA: Insufficient documentation

## 2016-07-05 HISTORY — PX: CARDIAC CATHETERIZATION: SHX172

## 2016-07-05 LAB — POCT I-STAT 3, VENOUS BLOOD GAS (G3P V)
Acid-base deficit: 3 mmol/L — ABNORMAL HIGH (ref 0.0–2.0)
Acid-base deficit: 3 mmol/L — ABNORMAL HIGH (ref 0.0–2.0)
Bicarbonate: 21.2 mmol/L (ref 20.0–28.0)
Bicarbonate: 22.1 mmol/L (ref 20.0–28.0)
O2 Saturation: 68 %
O2 Saturation: 94 %
TCO2: 22 mmol/L (ref 0–100)
TCO2: 23 mmol/L (ref 0–100)
pCO2, Ven: 36.2 mmHg — ABNORMAL LOW (ref 44.0–60.0)
pCO2, Ven: 39.7 mmHg — ABNORMAL LOW (ref 44.0–60.0)
pH, Ven: 7.354 (ref 7.250–7.430)
pH, Ven: 7.375 (ref 7.250–7.430)
pO2, Ven: 37 mmHg (ref 32.0–45.0)
pO2, Ven: 71 mmHg — ABNORMAL HIGH (ref 32.0–45.0)

## 2016-07-05 LAB — POCT ACTIVATED CLOTTING TIME
Activated Clotting Time: 202 seconds
Activated Clotting Time: 180 seconds

## 2016-07-05 SURGERY — RIGHT/LEFT HEART CATH AND CORONARY ANGIOGRAPHY
Anesthesia: LOCAL

## 2016-07-05 MED ORDER — SODIUM CHLORIDE 0.9 % IV SOLN: 250.0000 mL | INTRAVENOUS | Status: DC | PRN

## 2016-07-05 MED ORDER — ASPIRIN 81 MG PO CHEW
CHEWABLE_TABLET | ORAL | Status: AC
  Administered 2016-07-05: 81 mg
  Filled 2016-07-05: qty 1

## 2016-07-05 MED ORDER — SODIUM CHLORIDE 0.9 % IV SOLN: INTRAVENOUS | Status: AC

## 2016-07-05 MED ORDER — MIDAZOLAM HCL 2 MG/2ML IJ SOLN
INTRAMUSCULAR | Status: AC
  Filled 2016-07-05: qty 2

## 2016-07-05 MED ORDER — LIDOCAINE HCL (PF) 1 % IJ SOLN
INTRAMUSCULAR | Status: DC | PRN
  Administered 2016-07-05 (×2): 2 mL via INTRADERMAL
  Administered 2016-07-05: 20 mL via INTRADERMAL

## 2016-07-05 MED ORDER — SODIUM CHLORIDE 0.9% FLUSH: 3.0000 mL | Freq: Two times a day (BID) | INTRAVENOUS | Status: DC

## 2016-07-05 MED ORDER — ASPIRIN 81 MG PO CHEW: 81.0000 mg | CHEWABLE_TABLET | ORAL | Status: DC

## 2016-07-05 MED ORDER — LIDOCAINE HCL (PF) 1 % IJ SOLN
INTRAMUSCULAR | Status: AC
  Filled 2016-07-05: qty 30

## 2016-07-05 MED ORDER — IOPAMIDOL (ISOVUE-370) INJECTION 76%
INTRAVENOUS | Status: AC
  Filled 2016-07-05: qty 100

## 2016-07-05 MED ORDER — VERAPAMIL HCL 2.5 MG/ML IV SOLN
INTRAVENOUS | Status: AC
  Filled 2016-07-05: qty 2

## 2016-07-05 MED ORDER — NITROGLYCERIN 1 MG/10 ML FOR IR/CATH LAB
INTRA_ARTERIAL | Status: AC
  Filled 2016-07-05: qty 10

## 2016-07-05 MED ORDER — HEPARIN SODIUM (PORCINE) 1000 UNIT/ML IJ SOLN
INTRAMUSCULAR | Status: AC
  Filled 2016-07-05: qty 1

## 2016-07-05 MED ORDER — VERAPAMIL HCL 2.5 MG/ML IV SOLN
INTRAVENOUS | Status: DC | PRN
  Administered 2016-07-05: 10 mL via INTRA_ARTERIAL

## 2016-07-05 MED ORDER — ACETAMINOPHEN 500 MG PO TABS
1000.0000 mg | ORAL_TABLET | Freq: Once | ORAL | Status: AC
  Administered 2016-07-05: 1000 mg via ORAL

## 2016-07-05 MED ORDER — SODIUM CHLORIDE 0.9% FLUSH: 3.0000 mL | INTRAVENOUS | Status: DC | PRN

## 2016-07-05 MED ORDER — ACETAMINOPHEN 500 MG PO TABS
ORAL_TABLET | ORAL | Status: AC
  Administered 2016-07-05: 1000 mg via ORAL
  Filled 2016-07-05: qty 2

## 2016-07-05 MED ORDER — MIDAZOLAM HCL 2 MG/2ML IJ SOLN
INTRAMUSCULAR | Status: DC | PRN
  Administered 2016-07-05: 1 mg via INTRAVENOUS
  Administered 2016-07-05: 2 mg via INTRAVENOUS

## 2016-07-05 MED ORDER — HEPARIN (PORCINE) IN NACL 2-0.9 UNIT/ML-% IJ SOLN
INTRAMUSCULAR | Status: AC
  Filled 2016-07-05: qty 1000

## 2016-07-05 MED ORDER — FENTANYL CITRATE (PF) 100 MCG/2ML IJ SOLN
INTRAMUSCULAR | Status: DC | PRN
  Administered 2016-07-05 (×2): 25 ug via INTRAVENOUS

## 2016-07-05 MED ORDER — IOPAMIDOL (ISOVUE-370) INJECTION 76%
INTRAVENOUS | Status: DC | PRN
Start: 2016-07-05 — End: 2016-07-05
  Administered 2016-07-05: 70 mL via INTRA_ARTERIAL

## 2016-07-05 MED ORDER — FENTANYL CITRATE (PF) 100 MCG/2ML IJ SOLN
INTRAMUSCULAR | Status: AC
  Filled 2016-07-05: qty 2

## 2016-07-05 MED ORDER — HEPARIN SODIUM (PORCINE) 1000 UNIT/ML IJ SOLN
INTRAMUSCULAR | Status: DC | PRN
  Administered 2016-07-05: 3000 [IU] via INTRAVENOUS

## 2016-07-05 MED ORDER — SODIUM CHLORIDE 0.9 % IV SOLN
INTRAVENOUS | Status: DC
  Administered 2016-07-05: 07:00:00 via INTRAVENOUS

## 2016-07-05 MED ORDER — HEPARIN (PORCINE) IN NACL 2-0.9 UNIT/ML-% IJ SOLN
INTRAMUSCULAR | Status: DC | PRN
  Administered 2016-07-05: 1000 mL

## 2016-07-05 SURGICAL SUPPLY — 18 items
CATH BALLN WEDGE 5F 110CM (CATHETERS) ×2 IMPLANT
CATH INFINITI 5 FR 3DRC (CATHETERS) ×2 IMPLANT
CATH INFINITI 5 FR AL2 (CATHETERS) ×2 IMPLANT
CATH INFINITI 5FR AL1 (CATHETERS) ×2 IMPLANT
CATH INFINITI 5FR MULTPACK ANG (CATHETERS) ×2 IMPLANT
DEVICE RAD COMP TR BAND LRG (VASCULAR PRODUCTS) ×2 IMPLANT
GLIDESHEATH SLEND SS 6F .021 (SHEATH) ×2 IMPLANT
KIT HEART LEFT (KITS) ×2 IMPLANT
KIT HEART RIGHT NAMIC (KITS) ×2 IMPLANT
PACK CARDIAC CATHETERIZATION (CUSTOM PROCEDURE TRAY) ×2 IMPLANT
SHEATH FAST CATH BRACH 5F 5CM (SHEATH) ×2 IMPLANT
SHEATH PINNACLE 5F 10CM (SHEATH) ×2 IMPLANT
TRANSDUCER W/STOPCOCK (MISCELLANEOUS) ×2 IMPLANT
TUBING CIL FLEX 10 FLL-RA (TUBING) ×2 IMPLANT
WIRE EMERALD 3MM-J .035X150CM (WIRE) ×2 IMPLANT
WIRE EMERALD 3MM-J .035X260CM (WIRE) ×2 IMPLANT
WIRE EMERALD ST .035X150CM (WIRE) ×2 IMPLANT
WIRE HI TORQ VERSACORE-J 145CM (WIRE) ×2 IMPLANT

## 2016-07-05 NOTE — H&P (View-Only) (Signed)
[facility]ve Clinic  Chief Complaint  Patient presents with  . Chronic Systolic Heart Failure    f/u  . Aortic Valve Disorder    f/u   Referring: Cristopher Peru  History of Present Illness: 60 yo female with history of non-ischemic cardiomyopathy s/p ICD with resolution of LV function, chronic systolic CHF, severe aortic stenosis, HLD, HTN who is here today for follow up of her aortic stenosis. I met her in March 2017. She was referred to the valve clinic by Dr. Lovena Le for evaluation of aortic stenosis. She is followed by Dr. Lovena Le for her non-ischemic cardiomyopathy and ICD. Echo 11/02/15 with normal LV systolic function, severe aortic valve stenosis with mean gradient of 51 mm Hg. She was asymptomatic at that time. Repeat echo September 2017 with normal LV function, severe AS with mean gradient 47 mmHg, peak gradient 86 mmHg, AVA 0.79cm2, mild AI, mild MR.   She is here today for follow up. She tells me today that she does not feel well. She denies chest pain or dyspnea. She does endorse dizziness when first standing. No syncope. No weight gain or lower extremity edema. She is on chronic coumadin therapy. She was told this was for her cardiomyopathy. She has had no bleeding issues. She is an active person but has been moving slower lately due to fatigue.    Primary Care Physician: Lujean Amel, MD   Past Medical History:  Diagnosis Date  . Abnormal liver function tests   . Aortic stenosis   . Benign neoplasm of colon 04/19/2001   Hyperplastic  . Bicuspid aortic valve   . Cardiomyopathy secondary   . CHF (congestive heart failure) ([place])   . HTN (hypertension)   . Hypercalcemia   . Hyperlipidemia   . S/P ICD (internal cardiac defibrillator) procedure     Past Surgical History:  Procedure Laterality Date  . implantation of a Guidant biventricular ICD    . laparoscopic cholecystectomy with intraoperative cholangiogram      Current Outpatient Prescriptions  Medication  Sig Dispense Refill  . carvedilol (COREG) 25 MG tablet Take 25 mg by mouth 2 (two) times daily with a meal.     . furosemide (LASIX) 40 MG tablet Take 1 tablet (40 mg) by mouth every other day. 15 tablet 11  . isosorbide-hydrALAZINE (BIDIL) 20-37.5 MG tablet Take by mouth. TAKE 1/2 TABLET BY MOUTH TWO TIMES DAILY    . Multiple Vitamin (MULTIVITAMIN) capsule Take 1 capsule by mouth daily.      Marland Kitchen olmesartan (BENICAR) 20 MG tablet Take 10 mg by mouth daily.      . rosuvastatin (CRESTOR) 10 MG tablet Take 1 tablet (10 mg total) by mouth daily. 90 tablet 3  . spironolactone (ALDACTONE) 25 MG tablet Take 1 tablet (25 mg total) by mouth daily. 90 tablet 3  . warfarin (COUMADIN) 5 MG tablet TAKES 2.5 MG 3 DAYS A WEEK AND 5 MG THE OTHER DAYS BY MOUTH. Use as directed     No current facility-administered medications for this visit.     No Known Allergies  Social History   Social History  . Marital status: Single    Spouse name: N/A  . Number of children: 1  . Years of education: N/A   Occupational History  . Therapist, art   .  Unemployed   Social History Main Topics  . Smoking status: Former Smoker    Packs/day: 1.00    Years: 40.00    Types: Cigarettes    Quit  date: 09/25/2010  . Smokeless tobacco: Never Used     Comment: none in last 15 days  . Alcohol use 0.0 oz/week     Comment: occasional  . Drug use: No  . Sexual activity: Not on file   Other Topics Concern  . Not on file   Social History Narrative  . No narrative on file    Family History  Problem Relation Age of Onset  . Pancreatic cancer Mother   . Colon cancer Maternal Aunt   . Bone cancer Brother   . Heart attack Brother   . Hypercalcemia Neg Hx     Review of Systems:  As stated in the HPI and otherwise negative.   BP 130/60   Pulse 72   Ht _0  (1.626 m)   Wt 58.9 kg (129 lb 12.8 oz)   SpO2 99%   BMI 22.28 kg/m   Physical Examination: General: Well developed, well nourished, NAD  HEENT: OP  clear, mucus membranes moist  SKIN: warm, dry. No rashes. Neuro: No focal deficits  Musculoskeletal: Muscle strength 5/5 all ext  Psychiatric: Mood and affect normal  Neck: No JVD, no carotid bruits, no thyromegaly, no lymphadenopathy.  Lungs:Clear bilaterally, no wheezes, rhonci, crackles Cardiovascular: Regular rate and rhythm. Loud harsh systolic murmur. No gallops or rubs. Abdomen:Soft. Bowel sounds present. Non-tender.  Extremities: No lower extremity edema. Pulses are 2 + in the bilateral DP/PT.  Echo 06/13/16: Left ventricle: The cavity size was normal. Wall thickness was   increased in a pattern of moderate LVH. Systolic function was   normal. The estimated ejection fraction was in the range of 55%   to 60%. Wall motion was normal; there were no regional wall   motion abnormalities. Doppler parameters are consistent with   abnormal left ventricular relaxation (grade 1 diastolic   dysfunction). Aortic valve:   Trileaflet; severely calcified leaflets. Valve mobility was restricted.  Doppler:   There was severe stenosis. There was mild regurgitation.    VTI ratio of LVOT to aortic valve: 0.23. Valve area (VTI): 0.74 cm^2. Indexed valve area (VTI): 0.45 cm^2/m^2. Peak velocity ratio of LVOT to aortic valve: 0.25. Valve area (Vmax): 0.79 cm^2. Indexed valve area (Vmax): 0.48 cm^2/m^2. Mean velocity ratio of LVOT to aortic valve: 0.23. Valve area (Vmean): 0.71 cm^2. Indexed valve area (Vmean): 0.43 cm^2/m^2. Mean gradient (S): 47 mm Hg. Peak gradient (S): 86 mm Hg. - Mitral valve: Calcified annulus. There was mild regurgitation. - Left atrium: The atrium was mildly dilated.  Impressions:  - Normal LV systolic function; moderate LVH; grade 1 diastolic   dysfunction; severely calcified aortic valve with severe AS; mild   AI; mild MR; mild LAE; trace TR.   EKG:  EKG is not  ordered today.  Recent Labs: 02/16/2016: Magnesium 1.9 02/17/2016: TSH 1.02 06/06/2016: Hemoglobin 16.2;  Platelets 130.0    Wt Readings from Last 3 Encounters:  06/16/16 58.9 kg (129 lb 12.8 oz)  06/06/16 59.4 kg (131 lb)  02/16/16 57.6 kg (127 lb)    Other studies Reviewed: Additional studies/ records that were reviewed today include: Echo images. I personally reviewed. . Review of the above records demonstrates: severe AS  STS Risk Score:  Risk of Mortality: 1.231%  Morbidity or Mortality: 10.963%  Long Length of Stay: 3.462%  Short Length of Stay: 51.122%  Permanent Stroke: 0.987%  Prolonged Ventilation: 5.904%  DSW Infection: 0.075%  Renal Failure: 1.635%  Reoperation: 6.114%    Assessment and Plan:   1.  Aortic stenosis: She has severe aortic valve stenosis. Her aortic valve is thickened and calcified. I have personally reviewed her echo images from this month. She most likely has a bicuspid valve. She is now symptomatic. I think AVR is indicated. I think she will likely be treated with a surgical AVR as her risk is low (STS risk score 1.2). I have reviewed in detail today with the patient the etiology of aortic stenosis, symptoms and treatment options. I will arrange a right and left heart catheterization at [facility] on 07/05/16. Will check pre-cath labs the week of the procedure. Risks and benefits of cath reviewed with pt. She will hold coumadin 5 days before the cath. She has no cardiac indication for coumadin. I will route this to her primary care doctor. If there is no other reason for coumadin, this can be stopped long term.  She will call her primary care doctor to discuss this issue.   -I will refer her to CT surgery following her cath to discuss surgical AVR  Current medicines are reviewed at length with the patient today.  The patient does not have concerns regarding medicines.  The following changes have been made:  no change  Labs/ tests ordered today include:   Orders Placed This Encounter  Procedures  . Basic Metabolic Panel (BMET)  . CBC w/Diff  . INR/PT     Disposition:   FU with me in 6 months  Signed, Lauree Chandler, MD 06/16/2016 10:41 AM    [facility] [address], [place], [place]  [address] Phone: [phone]; Fax: [phone]

## 2016-07-05 NOTE — Interval H&P Note (Signed)
History and Physical Interval Note:  07/05/2016 8:34 AM  [name]  has presented today for cardiac cath with the diagnosis of Aortic Stenosis  The various methods of treatment have been discussed with the patient and family. After consideration of risks, benefits and other options for treatment, the patient has consented to  Procedure(s): Right/Left Heart Cath and Coronary Angiography (N/A) as a surgical intervention .  The patient's history has been reviewed, patient examined, no change in status, stable for surgery.  I have reviewed the patient's chart and labs.  Questions were answered to the patient's satisfaction.    No PCI planned.    Lauree Chandler

## 2016-07-05 NOTE — Discharge Instructions (Signed)
Angiogram, Care After °Refer to this sheet in the next few weeks. These instructions provide you with information about caring for yourself after your procedure. Your health care provider may also give you more specific instructions. Your treatment has been planned according to current medical practices, but problems sometimes occur. Call your health care provider if you have any problems or questions after your procedure. °WHAT TO EXPECT AFTER THE PROCEDURE °After your procedure, it is typical to have the following: °· Bruising at the catheter insertion site that usually fades within 1-2 weeks. °· Blood collecting in the tissue (hematoma) that may be painful to the touch. It should usually decrease in size and tenderness within 1-2 weeks. °HOME CARE INSTRUCTIONS °· Take medicines only as directed by your health care provider. °· You may shower 24-48 hours after the procedure or as directed by your health care provider. Remove the bandage (dressing) and gently wash the site with plain soap and water. Pat the area dry with a clean towel. Do not rub the site, because this may cause bleeding. °· Do not take baths, swim, or use a hot tub until your health care provider approves. °· Check your insertion site every day for redness, swelling, or drainage. °· Do not apply powder or lotion to the site. °· Do not lift over 10 lb (4.5 kg) for 5 days after your procedure or as directed by your health care provider. °· Ask your health care provider when it is okay to: °¨ Return to work or school. °¨ Resume usual physical activities or sports. °¨ Resume sexual activity. °· Do not drive home if you are discharged the same day as the procedure. Have someone else drive you. °· You may drive 24 hours after the procedure unless otherwise instructed by your health care provider. °· Do not operate machinery or power tools for 24 hours after the procedure or as directed by your health care provider. °· If your procedure was done as an  outpatient procedure, which means that you went home the same day as your procedure, a responsible adult should be with you for the first 24 hours after you arrive home. °· Keep all follow-up visits as directed by your health care provider. This is important. °SEEK MEDICAL CARE IF: °· You have a fever. °· You have chills. °· You have increased bleeding from the catheter insertion site. Hold pressure on the site. °SEEK IMMEDIATE MEDICAL CARE IF: °· You have unusual pain at the catheter insertion site. °· You have redness, warmth, or swelling at the catheter insertion site. °· You have drainage (other than a small amount of blood on the dressing) from the catheter insertion site. °· The catheter insertion site is bleeding, and the bleeding does not stop after 30 minutes of holding steady pressure on the site. °· The area near or just beyond the catheter insertion site becomes pale, cool, tingly, or numb. °  °This information is not intended to replace advice given to you by your health care provider. Make sure you discuss any questions you have with your health care provider. °  °Document Released: 03/30/2005 Document Revised: 10/02/2014 Document Reviewed: 02/12/2013 °Elsevier Interactive Patient Education ©2016 [employer] °Radial Site Care °Refer to this sheet in the next few weeks. These instructions provide you with information about caring for yourself after your procedure. Your health care provider may also give you more specific instructions. Your treatment has been planned according to current medical practices, but problems sometimes occur. Call your   health care provider if you have any problems or questions after your procedure. °WHAT TO EXPECT AFTER THE PROCEDURE °After your procedure, it is typical to have the following: °· Bruising at the radial site that usually fades within 1-2 weeks. °· Blood collecting in the tissue (hematoma) that may be painful to the touch. It should usually decrease in size and  tenderness within 1-2 weeks. °HOME CARE INSTRUCTIONS °· Take medicines only as directed by your health care provider. °· You may shower 24-48 hours after the procedure or as directed by your health care provider. Remove the bandage (dressing) and gently wash the site with plain soap and water. Pat the area dry with a clean towel. Do not rub the site, because this may cause bleeding. °· Do not take baths, swim, or use a hot tub until your health care provider approves. °· Check your insertion site every day for redness, swelling, or drainage. °· Do not apply powder or lotion to the site. °· Do not flex or bend the affected arm for 24 hours or as directed by your health care provider. °· Do not push or pull heavy objects with the affected arm for 24 hours or as directed by your health care provider. °· Do not lift over 10 lb (4.5 kg) for 5 days after your procedure or as directed by your health care provider. °· Ask your health care provider when it is okay to: °¨ Return to work or school. °¨ Resume usual physical activities or sports. °¨ Resume sexual activity. °· Do not drive home if you are discharged the same day as the procedure. Have someone else drive you. °· You may drive 24 hours after the procedure unless otherwise instructed by your health care provider. °· Do not operate machinery or power tools for 24 hours after the procedure. °· If your procedure was done as an outpatient procedure, which means that you went home the same day as your procedure, a responsible adult should be with you for the first 24 hours after you arrive home. °· Keep all follow-up visits as directed by your health care provider. This is important. °SEEK MEDICAL CARE IF: °· You have a fever. °· You have chills. °· You have increased bleeding from the radial site. Hold pressure on the site. °SEEK IMMEDIATE MEDICAL CARE IF: °· You have unusual pain at the radial site. °· You have redness, warmth, or swelling at the radial site. °· You  have drainage (other than a small amount of blood on the dressing) from the radial site. °· The radial site is bleeding, and the bleeding does not stop after 30 minutes of holding steady pressure on the site. °· Your arm or hand becomes pale, cool, tingly, or numb. °  °This information is not intended to replace advice given to you by your health care provider. Make sure you discuss any questions you have with your health care provider. °  °Document Released: 10/14/2010 Document Revised: 10/02/2014 Document Reviewed: 03/30/2014 °Elsevier Interactive Patient Education ©2016 [employer] ° °

## 2016-07-05 NOTE — Progress Notes (Addendum)
Site area: RFA Site Prior to Removal:  Level 0 Pressure Applied For:2min Manual:  YES  Patient Status During Pull:  stable Post Pull Site:  Level Post Pull Instructions Given:  yes Post Pull Pulses Present: palpable Dressing Applied: tegaderm  Bedrest begins @ 2233 till 1415 Comments:

## 2016-07-06 MED FILL — Nitroglycerin IV Soln 100 MCG/ML in D5W: INTRA_ARTERIAL | Qty: 10 | Status: AC

## 2016-07-12 ENCOUNTER — Institutional Professional Consult (permissible substitution) (INDEPENDENT_AMBULATORY_CARE_PROVIDER_SITE_OTHER): Payer: Commercial Managed Care - HMO | Admitting: Surgery

## 2016-07-12 ENCOUNTER — Encounter: Payer: Self-pay | Admitting: Surgery

## 2016-07-12 VITALS — BP 92/62 | HR 79 | Resp 20 | Ht 64.0 in | Wt 132.0 lb

## 2016-07-12 DIAGNOSIS — I350 Nonrheumatic aortic (valve) stenosis: Secondary | ICD-10-CM

## 2016-07-13 ENCOUNTER — Encounter: Payer: Self-pay | Admitting: Surgery

## 2016-07-13 NOTE — Progress Notes (Signed)
Cardiothoracic Surgery Consultation  PCP is Lujean Amel, MD Referring Provider is Burnell Blanks*  Chief Complaint  Patient presents with  . Aortic Stenosis    Surgical eval, Cardiac Cath 07/05/16, ECHO 06/13/16    HPI:  The patient is a 60 year old woman with hypertension, hyperlipidemia and a remote history of non-ischemic cardiomyopathy dating back to 05/2002. She developed shortness of breath at that time and an echo showed an EF of 20-30% with mild AS, mild-moderate AI, and mild-moderate MR. She was treated medically with improvement and subsequently underwent implantation of a Bi-V ICD on 03/24/2005 for NICM with class III CHF and LBBB morphology. The etiology of her cardiomyopathy at that time was felt to be valvular heart disease and HTN. She says that she felt much better after that and a follow-up echo in 04/2008 showed an improvement in her EF to 65% with overall normal LV function, moderate AS with a mean gradient of 21 mm Hg and an AVA of 1.08 cm2 with mild to moderate AI, mild MR. She has a repeat echo in 02/2012 that showed an increased in the AV gradient to 42 mm Hg consistent with severe AS. The mean gradient was 38 mm Hg in 04/2014. She had an echo in 10/2015 that showed an increase in the mean gradient to 51 mm Hg with an AVA of 0.81 cm2 with moderate AI and normal LV function with moderate LVH. She was seen by Dr. Angelena Form at that time but was asymptomatic. A repeat echo in 05/2016 showed a mean gradient of 47 mm Hg with an AVA of 0.79 cm2 with mild AI and mild MR. She now says that she has developed exertional fatigue and tiredness. She denies any chest pain or pressure and has no shortness of breath. She describes some mental cloudiness and memory disturbance. Her niece and son are with her today and say that they have noted some decline in her activity and functional capacity.   Past Medical History:  Diagnosis Date  . Abnormal liver function tests   . Aortic stenosis    . Benign neoplasm of colon 04/19/2001   Hyperplastic  . Bicuspid aortic valve   . Cardiomyopathy secondary   . CHF (congestive heart failure) ([place])   . HTN (hypertension)   . Hypercalcemia   . Hyperlipidemia   . S/P ICD (internal cardiac defibrillator) procedure     Past Surgical History:  Procedure Laterality Date  . CARDIAC CATHETERIZATION N/A 07/05/2016   Procedure: Right/Left Heart Cath and Coronary Angiography;  Surgeon: Burnell Blanks, MD;  Location: [facility];  Service: Cardiovascular;  Laterality: N/A;  . implantation of a Guidant biventricular ICD    . laparoscopic cholecystectomy with intraoperative cholangiogram      Family History  Problem Relation Age of Onset  . Pancreatic cancer Mother   . Colon cancer Maternal Aunt   . Bone cancer Brother   . Heart attack Brother   . Hypercalcemia Neg Hx     Social History Social History  Substance Use Topics  . Smoking status: Current Smoker    Packs/day: 1.00    Years: 40.00    Types: Cigarettes    Quit date:   . Smokeless tobacco: Never Used     Comment: none in last 15 days  . Alcohol use 0.0 oz/week     Comment: occasional   She minimizes her smoking but her family says that she still smokes a pack a day. Only quit briefly  in the past.   Current Outpatient Prescriptions  Medication Sig Dispense Refill  . acetaminophen (TYLENOL) 325 MG tablet Take 325 mg by mouth every 6 (six) hours as needed for mild pain.    . carvedilol (COREG) 25 MG tablet Take 25 mg by mouth 2 (two) times daily with a meal.     . furosemide (LASIX) 40 MG tablet Take 1 tablet (40 mg) by mouth every other day. (Patient taking differently: Take 40 mg by mouth every other day. Take 1 tablet (40 mg) by mouth every other day.) 15 tablet 11  . isosorbide-hydrALAZINE (BIDIL) 20-37.5 MG tablet Take 0.5 tablets by mouth 2 (two) times daily. TAKE 1/2 TABLET BY MOUTH TWO TIMES DAILY     . Multiple Vitamin (MULTIVITAMIN) capsule Take 1  capsule by mouth daily.      Marland Kitchen olmesartan (BENICAR) 20 MG tablet Take 20 mg by mouth daily.     . rosuvastatin (CRESTOR) 10 MG tablet Take 1 tablet (10 mg total) by mouth daily. 90 tablet 3  . spironolactone (ALDACTONE) 25 MG tablet Take 1 tablet (25 mg total) by mouth daily. 90 tablet 3   No current facility-administered medications for this visit.     No Known Allergies  Review of Systems  Constitutional: Positive for activity change, appetite change, fatigue and unexpected weight change.  HENT: Positive for dental problem.        Has a loose tooth and has not seen dentist in years.  Eyes: Negative.   Respiratory: Negative for chest tightness and shortness of breath.   Cardiovascular: Negative for chest pain, palpitations and leg swelling.  Gastrointestinal: Negative.   Endocrine: Negative.   Genitourinary: Negative.   Musculoskeletal: Negative.   Allergic/Immunologic: Negative.   Neurological: Positive for dizziness.       Memory disturbance  Hematological: Negative.   Psychiatric/Behavioral: Positive for decreased concentration. The patient is nervous/anxious.     BP 92/62 (BP Location: Right Arm, Cuff Size: Small)   Pulse 79   Resp 20   Ht 5\' 4"  (1.626 m)   Wt 132 lb (59.9 kg)   SpO2 98% Comment: RA  BMI 22.66 kg/m  Physical Exam  Constitutional: She is oriented to person, place, and time. She appears well-developed and well-nourished. No distress.  HENT:  Head: Normocephalic and atraumatic.  Mouth/Throat: Oropharynx is clear and moist.  Multiple missing teeth.  Eyes: EOM are normal. Pupils are equal, round, and reactive to light.  Neck: Normal range of motion. Neck supple. No JVD present. No thyromegaly present.  Cardiovascular: Normal rate, regular rhythm and intact distal pulses.   Murmur heard. 3/6 systolic murmur RSB  Pulmonary/Chest: Effort normal and breath sounds normal. No respiratory distress. She has no wheezes. She has no rales.  Abdominal: Soft. Bowel  sounds are normal. She exhibits no distension and no mass. There is no tenderness.  Musculoskeletal: Normal range of motion. She exhibits no edema.  Lymphadenopathy:    She has no cervical adenopathy.  Neurological: She is alert and oriented to person, place, and time. She has normal strength. No cranial nerve deficit or sensory deficit.  Skin: Skin is warm and dry.  Psychiatric: She has a normal mood and affect.    Diagnostic Tests:   [facility]                        [address]  [place], [place] [address]                            [phone]  ------------------------------------------------------------------- Transthoracic Echocardiography  Patient:    Kristiana, Jacko MR #:       [id number] Study Date: 06/13/2016 Gender:     F Age:        66 Height:     162.6 cm Weight:     59.4 kg BSA:        1.64 m^2 Pt. Status: Room:   ATTENDING    Kirk Ruths  SONOGRAPHER  Oletta Lamas, Will  Willia Craze, Christopher  REFERRING    McAlhany, Christopher  PERFORMING   [facility], Outpatient  cc:  ------------------------------------------------------------------- LV EF: 55% -   60%  ------------------------------------------------------------------- History:   PMH:  LBBB. Bicuspid aortic valve. ICD. Acquired from the patient and from the patient&'s chart.  Congestive heart failure.  Aortic stenosis.  Aortic valve disease.  Risk factors: Former tobacco use. Hypertension. Dyslipidemia.  ------------------------------------------------------------------- Study Conclusions  - Left ventricle: The cavity size was normal. Wall thickness was   increased in a pattern of moderate LVH. Systolic function was   normal. The estimated ejection fraction was in the range of 55%   to 60%. Wall motion was normal; there were no regional wall   motion abnormalities. Doppler parameters are consistent with   abnormal left ventricular relaxation  (grade 1 diastolic   dysfunction). - Aortic valve: Valve mobility was restricted. There was severe   stenosis. There was mild regurgitation. - Mitral valve: Calcified annulus. There was mild regurgitation. - Left atrium: The atrium was mildly dilated.  Impressions:  - Normal LV systolic function; moderate LVH; grade 1 diastolic   dysfunction; severely calcified aortic valve with severe AS; mild   AI; mild MR; mild LAE; trace TR.  ------------------------------------------------------------------- Study data:  Comparison was made to the study of 11/02/2015.  Study status:  Routine.  Procedure:  The patient reported no pain pre or post test. Transthoracic echocardiography for left ventricular function evaluation and for assessment of valvular function. Image quality was adequate.  Study completion:  There were no complications.          Transthoracic echocardiography.  M-mode, complete 2D, spectral Doppler, and color Doppler.  Birthdate: Patient birthdate: [birth year].  Age:  Patient is 60 yr old.  Sex: Gender: female.    BMI: 22.5 kg/m^2.  Blood pressure:     88/40 Patient status:  Outpatient.  Study date:  Study date: 06/13/2016. Study time: 10:55 AM.  Location:  [facility]  -------------------------------------------------------------------  ------------------------------------------------------------------- Left ventricle:  The cavity size was normal. Wall thickness was increased in a pattern of moderate LVH. Systolic function was normal. The estimated ejection fraction was in the range of 55% to 60%. Wall motion was normal; there were no regional wall motion abnormalities. Doppler parameters are consistent with abnormal left ventricular relaxation (grade 1 diastolic dysfunction).  ------------------------------------------------------------------- Aortic valve:   Trileaflet; severely calcified leaflets. Valve mobility was restricted.  Doppler:   There was severe  stenosis. There was mild regurgitation.    VTI ratio of LVOT to aortic valve: 0.23. Valve area (VTI): 0.74 cm^2. Indexed valve area (VTI): 0.45 cm^2/m^2. Peak velocity ratio of LVOT to aortic valve: 0.25. Valve area (Vmax): 0.79 cm^2. Indexed valve area (Vmax): 0.48 cm^2/m^2. Mean velocity ratio of LVOT to aortic valve: 0.23. Valve area (Vmean): 0.71 cm^2. Indexed valve  area (Vmean): 0.43 cm^2/m^2. Mean gradient (S): 47 mm Hg. Peak gradient (S): 86 mm Hg.  ------------------------------------------------------------------- Aorta:  Aortic root: The aortic root was normal in size.  ------------------------------------------------------------------- Mitral valve:   Calcified annulus. Mobility was not restricted. Doppler:  Transvalvular velocity was within the normal range. There was no evidence for stenosis. There was mild regurgitation.    Peak gradient (D): 3 mm Hg.  ------------------------------------------------------------------- Left atrium:  The atrium was mildly dilated.  ------------------------------------------------------------------- Right ventricle:  The cavity size was normal. Pacer wire or catheter noted in right ventricle. Systolic function was normal.   ------------------------------------------------------------------- Pulmonic valve:    Doppler:  Transvalvular velocity was within the normal range. There was no evidence for stenosis.  ------------------------------------------------------------------- Tricuspid valve:   Structurally normal valve.    Doppler: Transvalvular velocity was within the normal range. There was trivial regurgitation.  ------------------------------------------------------------------- Pulmonary artery:   Systolic pressure was within the normal range.   ------------------------------------------------------------------- Right atrium:  The atrium was normal in size. Pacer wire or catheter noted in right  atrium.  ------------------------------------------------------------------- Pericardium:  There was no pericardial effusion.  ------------------------------------------------------------------- Systemic veins: Inferior vena cava: The vessel was normal in size.  ------------------------------------------------------------------- Measurements   Left ventricle                            Value          Reference  LV ID, ED, PLAX chordal           (L)     40.2  mm       43 - 52  LV ID, ES, PLAX chordal                   27.3  mm       23 - 38  LV fx shortening, PLAX chordal            32    %        >=29  LV PW thickness, ED                       13.18 mm       ---------  IVS/LV PW ratio, ED                       1.02           <=1.3  Stroke volume, 2D                         79    ml       ---------  Stroke volume/bsa, 2D                     48    ml/m^2   ---------    Ventricular septum                        Value          Reference  IVS thickness, ED                         13.43 mm       ---------    LVOT  Value          Reference  LVOT ID, S                                20    mm       ---------  LVOT area                                 3.14  cm^2     ---------  LVOT ID                                   20    mm       ---------  LVOT peak velocity, S                     116   cm/s     ---------  LVOT mean velocity, S                     72.1  cm/s     ---------  LVOT VTI, S                               25.3  cm       ---------  LVOT peak gradient, S                     5     mm Hg    ---------  Stroke volume (SV), LVOT DP               79.5  ml       ---------  Stroke index (SV/bsa), LVOT DP            48.4  ml/m^2   ---------    Aortic valve                              Value          Reference  Aortic valve peak velocity, S             464   cm/s     ---------  Aortic valve mean velocity, S             318   cm/s     ---------   Aortic valve VTI, S                       108   cm       ---------  Aortic mean gradient, S                   47    mm Hg    ---------  Aortic peak gradient, S                   86    mm Hg    ---------  VTI ratio, LVOT/AV                        0.23           ---------  Aortic valve area, VTI  0.74  cm^2     ---------  Aortic valve area/bsa, VTI                0.45  cm^2/m^2 ---------  Velocity ratio, peak, LVOT/AV             0.25           ---------  Aortic valve area, peak velocity          0.79  cm^2     ---------  Aortic valve area/bsa, peak               0.48  cm^2/m^2 ---------  velocity  Velocity ratio, mean, LVOT/AV             0.23           ---------  Aortic valve area, mean velocity          0.71  cm^2     ---------  Aortic valve area/bsa, mean               0.43  cm^2/m^2 ---------  velocity  Aortic regurg pressure half-time          349   ms       ---------    Aorta                                     Value          Reference  Aortic root ID, ED                        30    mm       ---------  Ascending aorta ID, A-P, S                35    mm       ---------    Left atrium                               Value          Reference  LA ID, A-P, ES                            37    mm       ---------  LA ID/bsa, A-P                    (H)     2.25  cm/m^2   <=2.2  LA volume, S                              56    ml       ---------  LA volume/bsa, S                          34.1  ml/m^2   ---------  LA volume, ES, 1-p A4C                    52    ml       ---------  LA volume/bsa, ES, 1-p A4C                31.7  ml/m^2   ---------  LA volume, ES, 1-p A2C                    60    ml       ---------  LA volume/bsa, ES, 1-p A2C                36.5  ml/m^2   ---------    Mitral valve                              Value          Reference  Mitral E-wave peak velocity               80.5  cm/s     ---------  Mitral A-wave peak velocity               98.7  cm/s      ---------  Mitral deceleration time          (H)     271   ms       150 - 230  Mitral peak gradient, D                   3     mm Hg    ---------  Mitral E/A ratio, peak                    0.8            ---------    Pulmonary arteries                        Value          Reference  PA pressure, S, DP                        20    mm Hg    <=30    Tricuspid valve                           Value          Reference  Tricuspid regurg peak velocity            205   cm/s     ---------  Tricuspid peak RV-RA gradient             17    mm Hg    ---------    Systemic veins                            Value          Reference  Estimated CVP                             3     mm Hg    ---------    Right ventricle                           Value          Reference  RV pressure, S, DP                        20    mm Hg    <=  30  Legend: (L)  and  (H)  mark values outside specified reference range.  ------------------------------------------------------------------- Prepared and Electronically Authenticated by  Kirk Ruths 2017-09-19T14:22:54   Burnell Blanks, MD (Primary)    Procedures   Right/Left Heart Cath and Coronary Angiography  Conclusion     Prox RCA lesion, 20 %stenosed.  Mid RCA lesion, 50 %stenosed.  Dist RCA lesion, 90 %stenosed.  Ost 2nd Mrg to 2nd Mrg lesion, 20 %stenosed.  Ost LAD to Mid LAD lesion, 20 %stenosed.  Ost 2nd Diag lesion, 20 %stenosed.  There is severe aortic valve stenosis.   1. No obstructive disease noted in the major epicardial vessels.  2. Severe aortic valve stenosis (mean gradient 51.4 mmHg, peak gradient 59 mm Hg, AVA 0.5cm2)  Recommendations: Medical management of CAD. Surgical referral for evaluation for surgical aortic valve replacement.    Indications   Severe aortic stenosis [I35.0 (ICD-10-CM)]  Procedural Details/Technique   Technical Details Indication: 60 yo female with severe aortic stenosis, NICM, HTN, HLD.    Procedure: The risks, benefits, complications, treatment options, and expected outcomes were discussed with the patient. The patient and/or family concurred with the proposed plan, giving informed consent. The patient was brought to the cath lab after IV hydration was begun and oral premedication was given. The patient was further sedated with Versed and Fentanyl. There was an IV catheter present in the right antecubital vein. This area was prepped and draped. I changed out the IV catheter for a 5 French sheath. Balloon tipped catheter used for right heart catheterization. The right wrist was prepped and draped in a sterile fashion. 1% lidocaine was used for local anesthesia. Using the modified Seldinger access technique, a 5 French sheath was placed in the right radial artery. 3 mg Verapamil was given through the sheath. 3000 units IV heparin was given. I could not engage the coronaries due to severe tortuosity of the right subclavian artery. I then prepped the right groin and placed a 5 French sheath in the right femoral artery. Standard diagnostic catheters were used to perform selective coronary angiography. I crossed the aortic valve with an AL-1 catheter and a straight wire. No LV gram performed. The sheath was removed from the right radial artery and a Terumo hemostasis band was applied at the arteriotomy site on the right wrist.     Estimated blood loss <50 mL.  During this procedure the patient was administered the following to achieve and maintain moderate conscious sedation: Versed 3 mg, Fentanyl 50 mcg, while the patient's heart rate, blood pressure, and oxygen saturation were continuously monitored. The period of conscious sedation was 60 minutes, of which I was present face-to-face 100% of this time.    Complications   Complications documented before study signed (07/05/2016 10:27 AM EDT)    RIGHT/LEFT HEART CATH AND CORONARY ANGIOGRAPHY   None Documented by Burnell Blanks, MD  07/05/2016 9:51 AM EDT  Time Range: Intra-procedure      Coronary Findings   Dominance: Right  Left Anterior Descending  Ost LAD to Mid LAD lesion, 20% stenosed.  First Diagonal Branch  Vessel is small in size.  Second Diagonal Branch  Vessel is moderate in size.  Ost 2nd Diag lesion, 20% stenosed.  Third Diagonal Branch  Vessel is small in size.  Left Circumflex  Vessel is large.  First Obtuse Marginal Branch  Vessel is small in size.  Second Obtuse Marginal Branch  Vessel is large in size.  Ost 2nd Mrg to 2nd  Mrg lesion, 20% stenosed.  Third Obtuse Marginal Branch  Vessel is moderate in size.  Right Coronary Artery  Prox RCA lesion, 20% stenosed.  Mid RCA lesion, 50% stenosed.  Dist RCA lesion, 90% stenosed.  Left Heart   Aortic Valve There is severe aortic valve stenosis. The aortic valve is calcified.    Coronary Diagrams   Diagnostic Diagram     Implants     No implant documentation for this case.  PACS Images   Show images for Cardiac catheterization   Link to Procedure Log   Procedure Log    Hemo Data   Flowsheet Row Most Recent Value  Fick Cardiac Output 4.08 L/min  Fick Cardiac Output Index 2.49 (L/min)/BSA  Aortic Mean Gradient 51.4 mmHg  Aortic Peak Gradient 59 mmHg  Aortic Valve Area 0.50  Aortic Value Area Index 0.3 cm2/BSA  RA A Wave 3 mmHg  RA V Wave 2 mmHg  RA Mean 0 mmHg  RV Systolic Pressure 23 mmHg  RV Diastolic Pressure 0 mmHg  RV EDP 3 mmHg  PA Systolic Pressure 23 mmHg  PA Diastolic Pressure 2 mmHg  PA Mean 12 mmHg  PW A Wave 4 mmHg  PW V Wave 5 mmHg  PW Mean 3 mmHg  AO Systolic Pressure 676 mmHg  AO Diastolic Pressure 62 mmHg  AO Mean 85 mmHg  LV Systolic Pressure 195 mmHg  LV Diastolic Pressure 8 mmHg  LV EDP 16 mmHg  Arterial Occlusion Pressure Extended Systolic Pressure 95 mmHg  Arterial Occlusion Pressure Extended Diastolic Pressure 51 mmHg  Arterial Occlusion Pressure Extended Mean Pressure 69 mmHg  Left  Ventricular Apex Extended Systolic Pressure 093 mmHg  Left Ventricular Apex Extended Diastolic Pressure 7 mmHg  Left Ventricular Apex Extended EDP Pressure 14 mmHg  QP/QS 1  TPVR Index 4.81 HRUI  TSVR Index 34.13 HRUI  TPVR/TSVR Ratio 0.14     Impression:  This 60 year old woman has stage D severe symptomatic aortic stenosis with development of exertional fatigue and tiredness. Her echo reports document severe aortic stenosis back in 2013 but she was asymptomatic at that time. I have personally reviewed and interpreted her recent echo and cath. She has a trileaflet aortic valve that is severely calcified with restricted leaflet motion with a mean gradient of 47 mm Hg. Her LV function is normal and has been since 2009 with moderate concentric LVH despite having NICM and an EF of 20-30% back in 2003. The etiology of this is unclear. Her cath shows mild nonobstructive CAD with a 90% stenosis in the very distal RCA before a small PL branch and a 50% mid RCA lesion. I think this can be managed medically and does not require coronary bypass grafting. I think AVR is indicated for relief of symptoms and improvement in long term prognosis. I discussed the options for prosthesis including mechanical and tissue valves. She had been on coumadin for a long time since she was diagnosed with NICM and it was recently stopped prior to her cath with no plans to restart it since there is no indication for it. I think a tissue or mechanical valve would be fine at her age. She has never had any problems being on coumadin. She does describe some memory problems recently and it is not clear if this is related to her AS and feeling poorly in general or some early dementia. It is probably better to err on the side of caution and use a tissue valve. She does not  feel strongly about either choice.   She has not seen a dentist in several years and has a loose tooth so she needs to have dental evaluation and treatment prior to  valve replacement. Her niece is going to get her into see her dentist and once that is completed she will call us to schedule AVR.  I discussed the operative procedure with the patient and family including alternatives, benefits and risks; including but not limited to bleeding, blood transfusion, infection, stroke, myocardial infarction, heart block and pacer dependence, organ dysfunction, and death.  [name] understands and agrees to proceed.    Plan:  She will call to schedule AVR after her dental evaluation and treatment.    I spent 60 minutes performing this consultation and > 50% of this time was spent face to face counseling and coordinating the care of this patient's severe aortic stenosis.   Gaye Pollack, MD [facility] [phone]

## 2016-07-18 DIAGNOSIS — Z7901 Long term (current) use of anticoagulants: Secondary | ICD-10-CM | POA: Diagnosis not present

## 2016-07-18 DIAGNOSIS — D582 Other hemoglobinopathies: Secondary | ICD-10-CM | POA: Diagnosis not present

## 2016-07-18 DIAGNOSIS — Z23 Encounter for immunization: Secondary | ICD-10-CM | POA: Diagnosis not present

## 2016-08-11 DIAGNOSIS — R69 Illness, unspecified: Secondary | ICD-10-CM | POA: Diagnosis not present

## 2016-08-16 ENCOUNTER — Encounter (HOSPITAL_COMMUNITY): Payer: Self-pay | Admitting: Emergency Medicine

## 2016-08-16 ENCOUNTER — Emergency Department (HOSPITAL_COMMUNITY)
Admission: EM | Admit: 2016-08-16 | Discharge: 2016-08-16 | Disposition: A | Discharge status: Home / Self Care | Payer: Commercial Managed Care - HMO | Attending: Emergency Medicine | Admitting: Emergency Medicine

## 2016-08-16 ENCOUNTER — Emergency Department (HOSPITAL_COMMUNITY): Payer: Commercial Managed Care - HMO

## 2016-08-16 DIAGNOSIS — S20211A Contusion of right front wall of thorax, initial encounter: Secondary | ICD-10-CM | POA: Insufficient documentation

## 2016-08-16 DIAGNOSIS — F17210 Nicotine dependence, cigarettes, uncomplicated: Secondary | ICD-10-CM | POA: Diagnosis not present

## 2016-08-16 DIAGNOSIS — I5022 Chronic systolic (congestive) heart failure: Secondary | ICD-10-CM | POA: Diagnosis not present

## 2016-08-16 DIAGNOSIS — Y999 Unspecified external cause status: Secondary | ICD-10-CM | POA: Diagnosis not present

## 2016-08-16 DIAGNOSIS — I110 Hypertensive heart disease with heart failure: Secondary | ICD-10-CM | POA: Insufficient documentation

## 2016-08-16 DIAGNOSIS — Y939 Activity, unspecified: Secondary | ICD-10-CM | POA: Diagnosis not present

## 2016-08-16 DIAGNOSIS — Z79899 Other long term (current) drug therapy: Secondary | ICD-10-CM | POA: Diagnosis not present

## 2016-08-16 DIAGNOSIS — Y92410 Unspecified street and highway as the place of occurrence of the external cause: Secondary | ICD-10-CM | POA: Diagnosis not present

## 2016-08-16 DIAGNOSIS — S299XXA Unspecified injury of thorax, initial encounter: Secondary | ICD-10-CM | POA: Diagnosis present

## 2016-08-16 DIAGNOSIS — R079 Chest pain, unspecified: Secondary | ICD-10-CM | POA: Diagnosis not present

## 2016-08-16 MED ORDER — HYDROCODONE-ACETAMINOPHEN 5-325 MG PO TABS: 1.0000 | ORAL_TABLET | Freq: Four times a day (QID) | ORAL | 0 refills | Status: DC | PRN

## 2016-08-16 MED ORDER — HYDROCODONE-ACETAMINOPHEN 5-325 MG PO TABS
1.0000 | ORAL_TABLET | Freq: Once | ORAL | Status: AC
  Administered 2016-08-16: 1 via ORAL
  Filled 2016-08-16: qty 1
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
## 2016-08-16 NOTE — Discharge Instructions (Signed)
You have been given a prescription for Vicodin or hydrocodone for pain control.  Please take this as needed for severe pain.  He can intersperse this with Tylenol.  Please follow-up with your primary care physician as needed for persistent or worsening pain.  Your chest x-ray shows no rib fractures or sternal injury

## 2016-08-16 NOTE — ED Notes (Signed)
ED Provider at bedside. 

## 2016-08-16 NOTE — ED Notes (Signed)
Bed: WA08 Expected date:  Expected time:  Means of arrival:  Comments: 
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
## 2016-08-16 NOTE — ED Notes (Signed)
Pt transported to radiology.

## 2016-09-04 ENCOUNTER — Ambulatory Visit (INDEPENDENT_AMBULATORY_CARE_PROVIDER_SITE_OTHER): Payer: Commercial Managed Care - HMO | Admitting: *Deleted

## 2016-09-04 DIAGNOSIS — I472 Ventricular tachycardia, unspecified: Secondary | ICD-10-CM

## 2016-09-04 NOTE — Progress Notes (Signed)
Remote ICD transmission.   

## 2016-09-08 ENCOUNTER — Encounter: Payer: Self-pay | Admitting: Cardiology

## 2016-09-11 ENCOUNTER — Telehealth: Payer: Self-pay | Admitting: Cardiovascular Disease

## 2016-09-11 NOTE — Telephone Encounter (Signed)
[name] is interested in getting rid of her landline phone. I will have [employer] send a cellular adapter for her home monitor. She is agreeable.

## 2016-09-11 NOTE — Telephone Encounter (Signed)
New Message  Pt voiced she is has questions about her defib.  Pt voiced if we needed a home phone to keep it plugged up and do we have to have internet/cable.  Pt voiced if she is away from home with remote check is it able to pick up if she's not at home.  Pt voiced is there a defib that can work off the internet instead of home phone jack.  Please f/u with pt

## 2016-09-28 ENCOUNTER — Telehealth: Payer: Self-pay | Admitting: Cardiovascular Disease

## 2016-09-28 NOTE — Telephone Encounter (Signed)
New Message   Patient calling regarding surgery that Dr. Angelena Form suggested. Requesting call back

## 2016-09-28 NOTE — Telephone Encounter (Signed)
I spoke with pt. She has seen dentist and would now like to proceed with AVR.  I told her this would be performed by Dr. Cyndia Bent.  I told her I would forward message to his office and they would contact her to schedule appt.

## 2016-09-28 NOTE — Telephone Encounter (Signed)
Did not need  This encounter

## 2016-10-02 ENCOUNTER — Telehealth: Payer: Self-pay | Admitting: Internal Medicine

## 2016-10-02 LAB — CUP PACEART REMOTE DEVICE CHECK
Battery Remaining Longevity: 60 mo
Battery Remaining Percentage: 95 %
Brady Statistic RA Percent Paced: 0 %
Brady Statistic RV Percent Paced: 100 %
Date Time Interrogation Session: 20171211062100
HighPow Impedance: 57 Ohm
Implantable Lead Implant Date: 20060630
Implantable Lead Implant Date: 20060630
Implantable Lead Implant Date: 20060630
Implantable Lead Location: 753858
Implantable Lead Location: 753859
Implantable Lead Location: 753860
Implantable Lead Model: 157
Implantable Lead Model: 4086
Implantable Lead Model: 4543
Implantable Lead Serial Number: 119717
Implantable Lead Serial Number: 131677
Implantable Lead Serial Number: 223436
Implantable Pulse Generator Implant Date: 20120131
Lead Channel Impedance Value: 1211 Ohm
Lead Channel Impedance Value: 474 Ohm
Lead Channel Impedance Value: 536 Ohm
Lead Channel Pacing Threshold Amplitude: 0.7 V
Lead Channel Pacing Threshold Amplitude: 0.9 V
Lead Channel Pacing Threshold Amplitude: 1 V
Lead Channel Pacing Threshold Pulse Width: 0.4 ms
Lead Channel Pacing Threshold Pulse Width: 0.4 ms
Lead Channel Pacing Threshold Pulse Width: 0.4 ms
Lead Channel Setting Pacing Amplitude: 2 V
Lead Channel Setting Pacing Amplitude: 2.2 V
Lead Channel Setting Pacing Amplitude: 2.4 V
Lead Channel Setting Pacing Pulse Width: 0.4 ms
Lead Channel Setting Pacing Pulse Width: 0.4 ms
Lead Channel Setting Sensing Sensitivity: 0.5 mV
Lead Channel Setting Sensing Sensitivity: 1 mV
Pulse Gen Serial Number: 498164

## 2016-10-02 NOTE — Telephone Encounter (Signed)
I reached out via email to [employer] with [employer] to order a Oncologist. He will contact [name] via phone with the best way to get her the adapter (drop off or mail).

## 2016-10-02 NOTE — Telephone Encounter (Signed)
New message      Pt no longer has a landline phone.  She has a cell phone only.  How long will it take to get the adaptor?

## 2016-10-04 ENCOUNTER — Ambulatory Visit: Payer: Commercial Managed Care - HMO | Admitting: Surgery

## 2016-10-11 ENCOUNTER — Ambulatory Visit: Payer: Medicare HMO | Admitting: Surgery

## 2016-10-17 ENCOUNTER — Ambulatory Visit (INDEPENDENT_AMBULATORY_CARE_PROVIDER_SITE_OTHER): Payer: Medicare HMO | Admitting: Surgery

## 2016-10-17 ENCOUNTER — Encounter: Payer: Self-pay | Admitting: Surgery

## 2016-10-17 VITALS — BP 138/78 | HR 84 | Resp 20 | Ht 64.0 in | Wt 132.0 lb

## 2016-10-17 DIAGNOSIS — I350 Nonrheumatic aortic (valve) stenosis: Secondary | ICD-10-CM | POA: Diagnosis not present

## 2016-10-21 ENCOUNTER — Encounter: Payer: Self-pay | Admitting: Surgery

## 2016-10-21 NOTE — Progress Notes (Signed)
HPI:  [name] returns today for further discussion of aortic valve replacement. She is a 61 year old woman has stage D severe symptomatic aortic stenosis with development of exertional fatigue and tiredness. Her echo reports document severe aortic stenosis back in 2013 but she was asymptomatic at that time. I have personally reviewed and interpreted her recent echo and cath. She has a trileaflet aortic valve that is severely calcified with restricted leaflet motion with a mean gradient of 47 mm Hg. Her LV function is normal and has been since 2009 with moderate concentric LVH despite having NICM and an EF of 20-30% back in 2003. The etiology of this is unclear. Her cath shows mild nonobstructive CAD with a 90% stenosis in the very distal RCA before a small PL branch and a 50% mid RCA lesion. I think this can be managed medically and does not require coronary bypass grafting. I saw her on 07/12/2016 for initial consultation and she had not seen a dentist in years and was having some problems with her teeth. She was going to see her dentist and then call to schedule surgery after her teeth were repaired but we had not heard back from her. She has been doing fairly well at home and her symptoms are about the same. She says that she is ready to get surgery done. She saw her dentist and had a tooth extracted and was told that her teeth were in adequate condition for valve surgery.  Current Outpatient Prescriptions  Medication Sig Dispense Refill  . acetaminophen (TYLENOL) 325 MG tablet Take 325 mg by mouth every 6 (six) hours as needed for mild pain.    . carvedilol (COREG) 25 MG tablet Take 25 mg by mouth 2 (two) times daily with a meal.     . furosemide (LASIX) 40 MG tablet Take 1 tablet (40 mg) by mouth every other day. (Patient taking differently: Take 40 mg by mouth every other day. Take 1 tablet (40 mg) by mouth every other day.) 15 tablet 11  . HYDROcodone-acetaminophen (NORCO/VICODIN) 5-325  MG tablet Take 1 tablet by mouth every 6 (six) hours as needed for severe pain. 10 tablet 0  . isosorbide-hydrALAZINE (BIDIL) 20-37.5 MG tablet Take 0.5 tablets by mouth 2 (two) times daily. TAKE 1/2 TABLET BY MOUTH TWO TIMES DAILY     . Multiple Vitamin (MULTIVITAMIN) capsule Take 1 capsule by mouth daily.      Marland Kitchen olmesartan (BENICAR) 20 MG tablet Take 20 mg by mouth daily.     . rosuvastatin (CRESTOR) 10 MG tablet Take 1 tablet (10 mg total) by mouth daily. 90 tablet 3  . spironolactone (ALDACTONE) 25 MG tablet Take 1 tablet (25 mg total) by mouth daily. 90 tablet 3   No current facility-administered medications for this visit.      Physical Exam: BP 138/78   Pulse 84   Resp 20   Ht 5\' 4"  (1.626 m)   Wt 132 lb (59.9 kg)   SpO2 99% Comment: RA  BMI 22.66 kg/m  She looks well Cardiac exam shows a regular rate and rhythm with a 3/6 systolic murmur along the RSB Lung exam is clear There is no peripheral edema  Diagnostic Tests:  None today  Impression:   I think AVR is indicated for relief of symptoms and improvement in long term prognosis. I discussed the options for prosthesis including mechanical and tissue valves. She had been on coumadin for a long time since she  was diagnosed with NICM and it was recently stopped prior to her cath with no plans to restart it since there is no indication for it. I think a tissue or mechanical valve would be fine at her age. She has never had any problems being on coumadin. She does describe some memory problems recently and it is not clear if this is related to her AS and feeling poorly in general or some early dementia. It is probably better to err on the side of caution and use a tissue valve. She is in agreement with that and would rather have a tissue valve.   Plan:  She is going to think about the timing of AVR and will call us back to schedule.   Gaye Pollack, MD [facility] [phone]

## 2016-11-02 ENCOUNTER — Other Ambulatory Visit: Payer: Self-pay | Admitting: *Deleted

## 2016-11-02 DIAGNOSIS — I350 Nonrheumatic aortic (valve) stenosis: Secondary | ICD-10-CM

## 2016-11-13 NOTE — Pre-Procedure Instructions (Signed)
JANASHIA PARCO  11/13/2016      [facility] [place] [address] Phone: [phone] Fax: [phone]  [place], [place] [address] [place] [address] [place] [address] Phone: [phone] Fax: [phone]  [facility] [address] [place], [place] [address] [place] [address] Phone: [phone] Fax: [phone]  [facility] [address] [place], [place] [address] [place] [address] Phone: [phone] Fax: [phone]    Your procedure is scheduled on Feb 22  Report to [facility] at 530 A.M.  Call this number if you have problems the morning of surgery:  [phone]   Remember:  Do not eat food or drink liquids after midnight.  Take these medicines the morning of surgery with A SIP OF WATER Tylenol if needed, carvedilol (Coreg), Hydrocodone (norco) if needed,  Stop taking aspirin, BC's, Goody's, Herbal medications, fish oil, ibuprofen, Advil, Motrin, Aleve   Do not wear jewelry, make-up or nail polish.  Do not wear lotions, powders, or perfumes, or deoderant.  Do not shave 48 hours prior to surgery.  Men may shave face and neck.  Do not bring valuables to the hospital.  [facility] is not responsible for any belongings or valuables.  Contacts, dentures or bridgework may not be worn into surgery.  Leave your suitcase in the car.  After surgery it may be brought to your room.  For patients admitted to the hospital, discharge time will be determined by your treatment team.  Patients discharged the day of surgery will not be allowed to drive home.    Special instructions:  [facility] - Preparing for Surgery  Before surgery, you can play an important role.  Because skin is not sterile, your skin needs to be as free of germs as possible.  You can  reduce the number of germs on you skin by washing with CHG (chlorahexidine gluconate) soap before surgery.  CHG is an antiseptic cleaner which kills germs and bonds with the skin to continue killing germs even after washing.  Please DO NOT use if you have an allergy to CHG or antibacterial soaps.  If your skin becomes reddened/irritated stop using the CHG and inform your nurse when you arrive at Short Stay.  Do not shave (including legs and underarms) for at least 48 hours prior to the first CHG shower.  You may shave your face.  Please follow these instructions carefully:   1.  Shower with CHG Soap the night before surgery and the   morning of Surgery.  2.  If you choose to wash your hair, wash your hair first as usual with your normal shampoo.  3.  After you shampoo, rinse your hair and body thoroughly to remove the   Shampoo.  4.  Use CHG as you would any other liquid soap.  You can apply chg directly   to the skin and wash gently with scrungie or a clean washcloth.  5.  Apply the CHG Soap to your body ONLY FROM THE NECK DOWN.  Do not use on open wounds or open sores.  Avoid contact with your eyes,       ears, mouth and genitals (private parts).  Wash genitals (private parts)  with your normal soap.  6.  Wash thoroughly, paying special attention to the area where your surgery will be performed.  7.  Thoroughly rinse your body with warm water from the neck down.  8.  DO NOT shower/wash with your normal soap after using and rinsing off  the CHG Soap.  9.  Pat yourself dry with a clean towel.            10.  Wear clean pajamas.            11.  Place clean sheets on your bed the night of your first shower and do not sleep with pets.  Day of Surgery  Do not apply any lotions/deoderants the morning of surgery.  Please wear clean clothes to the hospital/surgery center.     Please read over the following fact sheets that you were given. Pain Booklet, Coughing and Deep Breathing, MRSA  Information and Surgical Site Infection Prevention

## 2016-11-14 ENCOUNTER — Encounter (HOSPITAL_COMMUNITY)
Admission: RE | Admit: 2016-11-14 | Discharge: 2016-11-14 | Disposition: A | Discharge status: Home / Self Care | Payer: Medicare HMO | Source: Ambulatory Visit | Attending: Surgery | Admitting: Surgery

## 2016-11-14 ENCOUNTER — Ambulatory Visit (HOSPITAL_COMMUNITY)
Admission: RE | Admit: 2016-11-14 | Discharge: 2016-11-14 | Disposition: A | Discharge status: Home / Self Care | Payer: Medicare HMO | Source: Ambulatory Visit | Attending: Surgery | Admitting: Surgery

## 2016-11-14 ENCOUNTER — Ambulatory Visit (HOSPITAL_BASED_OUTPATIENT_CLINIC_OR_DEPARTMENT_OTHER)
Admission: RE | Admit: 2016-11-14 | Discharge: 2016-11-14 | Disposition: A | Discharge status: Home / Self Care | Payer: Medicare HMO | Source: Ambulatory Visit | Attending: Surgery | Admitting: Surgery

## 2016-11-14 ENCOUNTER — Encounter (HOSPITAL_COMMUNITY): Payer: Self-pay

## 2016-11-14 DIAGNOSIS — I350 Nonrheumatic aortic (valve) stenosis: Secondary | ICD-10-CM | POA: Diagnosis present

## 2016-11-14 DIAGNOSIS — Z01810 Encounter for preprocedural cardiovascular examination: Secondary | ICD-10-CM

## 2016-11-14 DIAGNOSIS — I10 Essential (primary) hypertension: Secondary | ICD-10-CM | POA: Diagnosis not present

## 2016-11-14 DIAGNOSIS — I428 Other cardiomyopathies: Secondary | ICD-10-CM | POA: Diagnosis present

## 2016-11-14 DIAGNOSIS — Z79899 Other long term (current) drug therapy: Secondary | ICD-10-CM | POA: Diagnosis not present

## 2016-11-14 DIAGNOSIS — Z4682 Encounter for fitting and adjustment of non-vascular catheter: Secondary | ICD-10-CM | POA: Diagnosis not present

## 2016-11-14 DIAGNOSIS — I080 Rheumatic disorders of both mitral and aortic valves: Secondary | ICD-10-CM | POA: Diagnosis not present

## 2016-11-14 DIAGNOSIS — Z87891 Personal history of nicotine dependence: Secondary | ICD-10-CM | POA: Diagnosis not present

## 2016-11-14 DIAGNOSIS — I509 Heart failure, unspecified: Secondary | ICD-10-CM | POA: Diagnosis not present

## 2016-11-14 DIAGNOSIS — J90 Pleural effusion, not elsewhere classified: Secondary | ICD-10-CM | POA: Diagnosis not present

## 2016-11-14 DIAGNOSIS — I2510 Atherosclerotic heart disease of native coronary artery without angina pectoris: Secondary | ICD-10-CM | POA: Diagnosis present

## 2016-11-14 DIAGNOSIS — Z95810 Presence of automatic (implantable) cardiac defibrillator: Secondary | ICD-10-CM | POA: Diagnosis not present

## 2016-11-14 DIAGNOSIS — Z01818 Encounter for other preprocedural examination: Secondary | ICD-10-CM | POA: Diagnosis not present

## 2016-11-14 DIAGNOSIS — J9811 Atelectasis: Secondary | ICD-10-CM | POA: Diagnosis not present

## 2016-11-14 DIAGNOSIS — I110 Hypertensive heart disease with heart failure: Secondary | ICD-10-CM | POA: Diagnosis present

## 2016-11-14 DIAGNOSIS — E119 Type 2 diabetes mellitus without complications: Secondary | ICD-10-CM | POA: Diagnosis present

## 2016-11-14 DIAGNOSIS — R5383 Other fatigue: Secondary | ICD-10-CM | POA: Diagnosis present

## 2016-11-14 DIAGNOSIS — R51 Headache: Secondary | ICD-10-CM | POA: Diagnosis not present

## 2016-11-14 DIAGNOSIS — I358 Other nonrheumatic aortic valve disorders: Secondary | ICD-10-CM | POA: Diagnosis not present

## 2016-11-14 DIAGNOSIS — F17210 Nicotine dependence, cigarettes, uncomplicated: Secondary | ICD-10-CM | POA: Diagnosis present

## 2016-11-14 DIAGNOSIS — E785 Hyperlipidemia, unspecified: Secondary | ICD-10-CM | POA: Diagnosis present

## 2016-11-14 DIAGNOSIS — I5032 Chronic diastolic (congestive) heart failure: Secondary | ICD-10-CM | POA: Diagnosis present

## 2016-11-14 DIAGNOSIS — I700 Atherosclerosis of aorta: Secondary | ICD-10-CM | POA: Diagnosis not present

## 2016-11-14 HISTORY — DX: Anxiety disorder, unspecified: F41.9

## 2016-11-14 HISTORY — DX: Headache, unspecified: R51.9

## 2016-11-14 HISTORY — DX: Headache: R51

## 2016-11-14 HISTORY — DX: Presence of automatic (implantable) cardiac defibrillator: Z95.810

## 2016-11-14 HISTORY — DX: Depression, unspecified: F32.A

## 2016-11-14 HISTORY — DX: Major depressive disorder, single episode, unspecified: F32.9

## 2016-11-14 HISTORY — DX: Cardiac murmur, unspecified: R01.1

## 2016-11-14 HISTORY — DX: Atherosclerotic heart disease of native coronary artery without angina pectoris: I25.10

## 2016-11-14 LAB — COMPREHENSIVE METABOLIC PANEL
ALT: 35 U/L (ref 14–54)
AST: 31 U/L (ref 15–41)
Albumin: 4.3 g/dL (ref 3.5–5.0)
Alkaline Phosphatase: 78 U/L (ref 38–126)
Anion gap: 10 (ref 5–15)
BUN: 14 mg/dL (ref 6–20)
CO2: 20 mmol/L — ABNORMAL LOW (ref 22–32)
Calcium: 11 mg/dL — ABNORMAL HIGH (ref 8.9–10.3)
Chloride: 108 mmol/L (ref 101–111)
Creatinine, Ser: 0.83 mg/dL (ref 0.44–1.00)
GFR calc Af Amer: >60 mL/min (ref >60)
GFR calc non Af Amer: >60 mL/min (ref >60)
Glucose, Bld: 100 mg/dL — ABNORMAL HIGH (ref 65–99)
Potassium: 4.2 mmol/L (ref 3.5–5.1)
Sodium: 138 mmol/L (ref 135–145)
Total Bilirubin: 1.1 mg/dL (ref 0.3–1.2)
Total Protein: 7.5 g/dL (ref 6.5–8.1)

## 2016-11-14 LAB — PULMONARY FUNCTION TEST
DL/VA % pred: 87 %
DL/VA: 4.2 ml/min/mmHg/L
DLCO cor % pred: 62 %
DLCO cor: 15.18 ml/min/mmHg
DLCO unc % pred: 62 %
DLCO unc: 15.18 ml/min/mmHg
FEF 25-75 Post: 2.08 L/sec
FEF 25-75 Pre: 1.42 L/sec
FEF2575-%Change-Post: 46 %
FEF2575-%Pred-Post: 105 %
FEF2575-%Pred-Pre: 72 %
FEV1-%Change-Post: 9 %
FEV1-%Pred-Post: 87 %
FEV1-%Pred-Pre: 80 %
FEV1-Post: 1.77 L
FEV1-Pre: 1.62 L
FEV1FVC-%Change-Post: 3 %
FEV1FVC-%Pred-Pre: 99 %
FEV6-%Change-Post: 5 %
FEV6-%Pred-Post: 87 %
FEV6-%Pred-Pre: 82 %
FEV6-Post: 2.18 L
FEV6-Pre: 2.06 L
FEV6FVC-%Pred-Post: 103 %
FEV6FVC-%Pred-Pre: 103 %
FVC-%Change-Post: 5 %
FVC-%Pred-Post: 84 %
FVC-%Pred-Pre: 80 %
FVC-Post: 2.18 L
FVC-Pre: 2.06 L
Post FEV1/FVC ratio: 81 %
Post FEV6/FVC ratio: 100 %
Pre FEV1/FVC ratio: 78 %
Pre FEV6/FVC Ratio: 100 %
RV % pred: 109 %
RV: 2.24 L
TLC % pred: 84 %
TLC: 4.29 L

## 2016-11-14 LAB — CBC
HCT: 47.1 % — ABNORMAL HIGH (ref 36.0–46.0)
Hemoglobin: 15.9 g/dL — ABNORMAL HIGH (ref 12.0–15.0)
MCH: 29.8 pg (ref 26.0–34.0)
MCHC: 33.8 g/dL (ref 30.0–36.0)
MCV: 88.2 fL (ref 78.0–100.0)
Platelets: 113 10*3/uL — ABNORMAL LOW (ref 150–400)
RBC: 5.34 MIL/uL — ABNORMAL HIGH (ref 3.87–5.11)
RDW: 15.3 % (ref 11.5–15.5)
WBC: 4.9 10*3/uL (ref 4.0–10.5)

## 2016-11-14 LAB — URINALYSIS, ROUTINE W REFLEX MICROSCOPIC
Bilirubin Urine: NEGATIVE
Glucose, UA: NEGATIVE mg/dL
Hgb urine dipstick: NEGATIVE
Ketones, ur: NEGATIVE mg/dL
Leukocytes, UA: NEGATIVE
Nitrite: NEGATIVE
Protein, ur: 100 mg/dL — AB
Specific Gravity, Urine: 1.011 (ref 1.005–1.030)
pH: 7 (ref 5.0–8.0)

## 2016-11-14 LAB — PROTIME-INR
INR: 0.88
Prothrombin Time: 11.9 seconds (ref 11.4–15.2)

## 2016-11-14 LAB — BLOOD GAS, ARTERIAL
Acid-base deficit: 0.4 mmol/L (ref 0.0–2.0)
Bicarbonate: 23.1 mmol/L (ref 20.0–28.0)
Drawn by: 421801
FIO2: 21
O2 Saturation: 97.4 %
Patient temperature: 98.6
pCO2 arterial: 34.2 mmHg (ref 32.0–48.0)
pH, Arterial: 7.444 (ref 7.350–7.450)
pO2, Arterial: 101 mmHg (ref 83.0–108.0)

## 2016-11-14 LAB — VAS US DOPPLER PRE CABG
LEFT ECA DIAS: -8 cm/s
LEFT VERTEBRAL DIAS: -5 cm/s
Left CCA dist dias: -7 cm/s
Left CCA dist sys: -45 cm/s
Left CCA prox dias: -6 cm/s
Left CCA prox sys: -68 cm/s
Left ICA dist dias: -14 cm/s
Left ICA dist sys: -57 cm/s
Left ICA prox dias: -13 cm/s
Left ICA prox sys: -89 cm/s
RIGHT ECA DIAS: -8 cm/s
RIGHT VERTEBRAL DIAS: -5 cm/s
Right CCA prox dias: -5 cm/s
Right CCA prox sys: -69 cm/s
Right cca dist sys: -58 cm/s

## 2016-11-14 LAB — SURGICAL PCR SCREEN
MRSA, PCR: NEGATIVE
Staphylococcus aureus: NEGATIVE

## 2016-11-14 LAB — ABO/RH: ABO/RH(D): A POS

## 2016-11-14 LAB — APTT: aPTT: 33 seconds (ref 24–36)

## 2016-11-14 MED ORDER — ALBUTEROL SULFATE (2.5 MG/3ML) 0.083% IN NEBU
2.5000 mg | INHALATION_SOLUTION | Freq: Once | RESPIRATORY_TRACT | Status: AC
  Administered 2016-11-14: 2.5 mg via RESPIRATORY_TRACT

## 2016-11-14 NOTE — Progress Notes (Signed)
Call from [place] at [employer] to call him on the day of surgery 20 mins before we want to turn off the ICD [facility] [phone]

## 2016-11-14 NOTE — Progress Notes (Addendum)
PCP is Dr. Addison Bailey Cardiologist is Dr Crissie Sickles States she was told to stop taking Coumadin back in Oct. By Dr Angelena Form States she doesn't remember the dentist name she saw. Echo noted from 06-13-16 Card cath noted from 07-05-16 Message left with [employer] with date and time of surgery.

## 2016-11-14 NOTE — Progress Notes (Signed)
Pre-op Cardiac Surgery  Carotid Findings:   Findings are consistent with a 1-39 percent stenosis involving the right internal carotid artery and the left internal carotid artery. The vertebral arteries demonstrate antegrade flow.  Upper Extremity Right Left  Brachial Pressures 178  Triphasic 155  Triphasic  Radial Waveforms Triphasic Triphasic  Ulnar Waveforms Triphasic Triphasic  Palmar Arch (Allen's Test) Palmar waveforms remain within normal limits with radial compression and are decreased greater than fifty percent with ulnar compression. Palmar waveforms remain within normal limits with radial compression and are decreased greater than fifty percent with ulnar compression.

## 2016-11-14 NOTE — Progress Notes (Signed)
Call received back from [place] states she will let DR Cyndia Bent know about the UA results. She states that the pt did see a dentist and is ok to proceed.

## 2016-11-14 NOTE — Progress Notes (Signed)
Called [place] and left message about ua results.

## 2016-11-15 ENCOUNTER — Encounter (HOSPITAL_COMMUNITY): Payer: Self-pay | Admitting: Certified Registered Nurse Anesthetist

## 2016-11-15 LAB — HEMOGLOBIN A1C
Hgb A1c MFr Bld: 6.1 % — ABNORMAL HIGH (ref 4.8–5.6)
Mean Plasma Glucose: 128

## 2016-11-15 MED ORDER — EPINEPHRINE PF 1 MG/ML IJ SOLN
0.0000 ug/min | INTRAVENOUS | Status: DC
  Filled 2016-11-15: qty 4

## 2016-11-15 MED ORDER — DEXMEDETOMIDINE HCL IN NACL 400 MCG/100ML IV SOLN
0.1000 ug/kg/h | INTRAVENOUS | Status: DC
  Filled 2016-11-15: qty 100

## 2016-11-15 MED ORDER — SODIUM CHLORIDE 0.9 % IV SOLN
30.0000 ug/min | INTRAVENOUS | Status: DC
  Filled 2016-11-15: qty 2

## 2016-11-15 MED ORDER — METOPROLOL TARTRATE 12.5 MG HALF TABLET: 12.5000 mg | ORAL_TABLET | Freq: Once | ORAL | Status: DC

## 2016-11-15 MED ORDER — PLASMA-LYTE 148 IV SOLN
INTRAVENOUS | Status: DC
  Filled 2016-11-15: qty 2.5

## 2016-11-15 MED ORDER — POTASSIUM CHLORIDE 2 MEQ/ML IV SOLN
80.0000 meq | INTRAVENOUS | Status: DC
  Filled 2016-11-15: qty 40

## 2016-11-15 MED ORDER — SODIUM CHLORIDE 0.9 % IV SOLN
INTRAVENOUS | Status: DC
  Filled 2016-11-15: qty 30

## 2016-11-15 MED ORDER — SODIUM CHLORIDE 0.9 % IV SOLN
INTRAVENOUS | Status: DC
  Filled 2016-11-15: qty 2.5

## 2016-11-15 MED ORDER — NITROGLYCERIN IN D5W 200-5 MCG/ML-% IV SOLN
2.0000 ug/min | INTRAVENOUS | Status: AC
  Administered 2016-11-16: 10 ug/min via INTRAVENOUS
  Filled 2016-11-15: qty 250

## 2016-11-15 MED ORDER — TRANEXAMIC ACID (OHS) PUMP PRIME SOLUTION
2.0000 mg/kg | INTRAVENOUS | Status: DC
  Filled 2016-11-15: qty 1.21

## 2016-11-15 MED ORDER — TRANEXAMIC ACID (OHS) BOLUS VIA INFUSION
15.0000 mg/kg | INTRAVENOUS | Status: AC
Start: 2016-11-16 — End: 2016-11-16
  Administered 2016-11-16: 909 mg via INTRAVENOUS
  Filled 2016-11-15: qty 909

## 2016-11-15 MED ORDER — VANCOMYCIN HCL 10 G IV SOLR
1250.0000 mg | INTRAVENOUS | Status: AC
  Administered 2016-11-16: 1250 mg via INTRAVENOUS
  Filled 2016-11-15: qty 1250

## 2016-11-15 MED ORDER — DEXTROSE 5 % IV SOLN
750.0000 mg | INTRAVENOUS | Status: DC
  Filled 2016-11-15: qty 750

## 2016-11-15 MED ORDER — MAGNESIUM SULFATE 50 % IJ SOLN
40.0000 meq | INTRAMUSCULAR | Status: DC
  Filled 2016-11-15: qty 10

## 2016-11-15 MED ORDER — DEXTROSE 5 % IV SOLN
1.5000 g | INTRAVENOUS | Status: AC
  Administered 2016-11-16: 1.5 g via INTRAVENOUS
  Administered 2016-11-16: .75 g via INTRAVENOUS
  Filled 2016-11-15: qty 1.5

## 2016-11-15 MED ORDER — SODIUM CHLORIDE 0.9 % IV SOLN
1.5000 mg/kg/h | INTRAVENOUS | Status: AC
  Administered 2016-11-16: 1.5 mg/kg/h via INTRAVENOUS
  Filled 2016-11-15: qty 25

## 2016-11-15 MED ORDER — DOPAMINE-DEXTROSE 3.2-5 MG/ML-% IV SOLN
0.0000 ug/kg/min | INTRAVENOUS | Status: DC
  Filled 2016-11-15: qty 250

## 2016-11-15 MED ORDER — CHLORHEXIDINE GLUCONATE 0.12 % MT SOLN
15.0000 mL | Freq: Once | OROMUCOSAL | Status: AC
  Administered 2016-11-16: 15 mL via OROMUCOSAL
  Filled 2016-11-15: qty 15

## 2016-11-15 NOTE — Progress Notes (Signed)
Anesthesia chart review: Patient is a 61 year old female scheduled for aortic valve replacement on 11/16/2016 by Dr. Cyndia Bent.  History includes former smoker, severe AS, CAD, non-ischemic cardiomyopathy '03 (normalization of EF following ICD implant), chronic systolic CHF, s/p BiV ICD [employer]) '06 (last 10/25/10), hypertension, hyperlipidemia, hypercalcemia, depression, anxiety, headaches, abnormal LFTs, cholecystectomy.   PCP is Dr. Dorthy Cooler. Cardiologist is Dr. Lovena Le.   Meds include Coreg, Lasix, Norco, isosorbide--hydralazine, olmesartan, rosuvastatin, Aldactone.  BP (!) 159/70   Pulse 69   Temp 36.8 C   Resp 20   Ht 5\' 4"  (1.626 m)   Wt 133 lb 8 oz (60.6 kg)   SpO2 100%   BMI 22.92 kg/m   EKG 11/14/2016: Atrial sensed ventricular paced rhythm.  Cardiac cath 07/05/16:  Prox RCA lesion, 20 %stenosed.  Mid RCA lesion, 50 %stenosed.  Dist RCA lesion, 90 %stenosed.  Ost 2nd Mrg to 2nd Mrg lesion, 20 %stenosed.  Ost LAD to Mid LAD lesion, 20 %stenosed.  Ost 2nd Diag lesion, 20 %stenosed.  There is severe aortic valve stenosis.  1. No obstructive disease noted in the major epicardial vessels.  2. Severe aortic valve stenosis (mean gradient 51.4 mmHg, peak gradient 59 mm Hg, AVA 0.5cm2) Recommendations: Medical management of CAD. Surgical referral for evaluation for surgical aortic valve replacement.   Echo 06/13/16: Impressions: - Normal LV systolic function; moderate LVH; grade 1 diastolic dysfunction; severely calcified aortic valve with severe AS.VTI ratio of LVOT to aortic valve: 0.23. Valve area (VTI): 0.74 cm^2. Indexed valve area (VTI): 0.45 cm^2/m^2. Peak velocity ratio of LVOT to aortic valve: 0.25. Valve area (Vmax): 0.79 cm^2. Indexed valve area (Vmax): 0.48 cm^2/m^2. Mean velocity ratio of LVOT to aortic valve: 0.23. Valve area (Vmean): 0.71 cm^2. Indexed valve area (Vmean): 0.43 cm^2/m^2. Mean gradient (S): 47 mm Hg. Peak gradient (S): 86 mm Hg. ; mild AI;  mild MR; mild LAE; trace TR.  Carotid U/S 11/14/16: Summary: Findings are consistent with a 1-39 percent stenosis involving the right internal carotid artery and the left internal carotid artery. The vertebral arteries demonstrate antegrade flow.    CXR 11/14/16: IMPRESSION: No active cardiopulmonary disease. Tortuosity and calcific atherosclerotic disease of the aorta.  PFTs 11/14/16: FVC 2.06 (80%), FEV1 1.62 (80%), DLCOunc 15.18 (62%).   Preoperative labs noted. Ca 11.0. LFTs WNL. Cr 0.83. H/H 15.9/47.1, PLT 113. PT/PTT WNL. A1c 6.1. T&S done.   [place] requests to be called 20 minutes prior to desired time to disable tachy therapies on ICD.   George Hugh [facility] Short Stay Center/Anesthesiology Phone [phone] 11/15/2016 10:16 AM

## 2016-11-16 ENCOUNTER — Inpatient Hospital Stay (HOSPITAL_COMMUNITY): Payer: Medicare HMO | Admitting: Certified Registered Nurse Anesthetist

## 2016-11-16 ENCOUNTER — Encounter (HOSPITAL_COMMUNITY): Payer: Self-pay | Admitting: *Deleted

## 2016-11-16 ENCOUNTER — Inpatient Hospital Stay (HOSPITAL_COMMUNITY)
Admission: RE | Admit: 2016-11-16 | Discharge: 2016-11-22 | DRG: 220 | Disposition: A | Discharge status: Home / Self Care | Payer: Medicare HMO | Source: Ambulatory Visit | Attending: Surgery | Admitting: Surgery

## 2016-11-16 ENCOUNTER — Inpatient Hospital Stay (HOSPITAL_COMMUNITY): Payer: Medicare HMO

## 2016-11-16 ENCOUNTER — Encounter (HOSPITAL_COMMUNITY)
Admission: RE | Disposition: A | Discharge status: Home / Self Care | Payer: Self-pay | Source: Ambulatory Visit | Attending: Surgery

## 2016-11-16 ENCOUNTER — Inpatient Hospital Stay (HOSPITAL_COMMUNITY): Payer: Medicare HMO | Admitting: Vascular Surgery

## 2016-11-16 DIAGNOSIS — R5383 Other fatigue: Secondary | ICD-10-CM | POA: Diagnosis present

## 2016-11-16 DIAGNOSIS — I5032 Chronic diastolic (congestive) heart failure: Secondary | ICD-10-CM | POA: Diagnosis present

## 2016-11-16 DIAGNOSIS — Z95810 Presence of automatic (implantable) cardiac defibrillator: Secondary | ICD-10-CM | POA: Diagnosis not present

## 2016-11-16 DIAGNOSIS — I350 Nonrheumatic aortic (valve) stenosis: Secondary | ICD-10-CM | POA: Diagnosis present

## 2016-11-16 DIAGNOSIS — I110 Hypertensive heart disease with heart failure: Secondary | ICD-10-CM | POA: Diagnosis present

## 2016-11-16 DIAGNOSIS — I428 Other cardiomyopathies: Secondary | ICD-10-CM | POA: Diagnosis present

## 2016-11-16 DIAGNOSIS — E785 Hyperlipidemia, unspecified: Secondary | ICD-10-CM | POA: Diagnosis present

## 2016-11-16 DIAGNOSIS — Z09 Encounter for follow-up examination after completed treatment for conditions other than malignant neoplasm: Secondary | ICD-10-CM

## 2016-11-16 DIAGNOSIS — I2510 Atherosclerotic heart disease of native coronary artery without angina pectoris: Secondary | ICD-10-CM | POA: Diagnosis present

## 2016-11-16 DIAGNOSIS — Z952 Presence of prosthetic heart valve: Secondary | ICD-10-CM

## 2016-11-16 DIAGNOSIS — E119 Type 2 diabetes mellitus without complications: Secondary | ICD-10-CM | POA: Diagnosis present

## 2016-11-16 DIAGNOSIS — Z79899 Other long term (current) drug therapy: Secondary | ICD-10-CM

## 2016-11-16 DIAGNOSIS — Z87891 Personal history of nicotine dependence: Secondary | ICD-10-CM

## 2016-11-16 DIAGNOSIS — I358 Other nonrheumatic aortic valve disorders: Secondary | ICD-10-CM | POA: Diagnosis not present

## 2016-11-16 DIAGNOSIS — F17210 Nicotine dependence, cigarettes, uncomplicated: Secondary | ICD-10-CM | POA: Diagnosis present

## 2016-11-16 HISTORY — PX: TEE WITHOUT CARDIOVERSION: SHX5443

## 2016-11-16 HISTORY — PX: AORTIC VALVE REPLACEMENT: SHX41

## 2016-11-16 LAB — PROTIME-INR
INR: 1.41
Prothrombin Time: 17.4 seconds — ABNORMAL HIGH (ref 11.4–15.2)

## 2016-11-16 LAB — POCT I-STAT, CHEM 8
BUN: 15 mg/dL (ref 6–20)
BUN: 13 mg/dL (ref 6–20)
BUN: 14 mg/dL (ref 6–20)
BUN: 12 mg/dL (ref 6–20)
BUN: 12 mg/dL (ref 6–20)
BUN: 11 mg/dL (ref 6–20)
Calcium, Ion: 1.39 mmol/L (ref 1.15–1.40)
Calcium, Ion: 1.13 mmol/L — ABNORMAL LOW (ref 1.15–1.40)
Calcium, Ion: 1.31 mmol/L (ref 1.15–1.40)
Calcium, Ion: 1.07 mmol/L — ABNORMAL LOW (ref 1.15–1.40)
Calcium, Ion: 0.97 mmol/L — ABNORMAL LOW (ref 1.15–1.40)
Calcium, Ion: 1.22 mmol/L (ref 1.15–1.40)
Chloride: 109 mmol/L (ref 101–111)
Chloride: 106 mmol/L (ref 101–111)
Chloride: 109 mmol/L (ref 101–111)
Chloride: 103 mmol/L (ref 101–111)
Chloride: 104 mmol/L (ref 101–111)
Chloride: 110 mmol/L (ref 101–111)
Creatinine, Ser: 0.6 mg/dL (ref 0.44–1.00)
Creatinine, Ser: 0.5 mg/dL (ref 0.44–1.00)
Creatinine, Ser: 0.6 mg/dL (ref 0.44–1.00)
Creatinine, Ser: 0.5 mg/dL (ref 0.44–1.00)
Creatinine, Ser: 0.6 mg/dL (ref 0.44–1.00)
Creatinine, Ser: 0.8 mg/dL (ref 0.44–1.00)
Glucose, Bld: 108 mg/dL — ABNORMAL HIGH (ref 65–99)
Glucose, Bld: 105 mg/dL — ABNORMAL HIGH (ref 65–99)
Glucose, Bld: 115 mg/dL — ABNORMAL HIGH (ref 65–99)
Glucose, Bld: 106 mg/dL — ABNORMAL HIGH (ref 65–99)
Glucose, Bld: 120 mg/dL — ABNORMAL HIGH (ref 65–99)
Glucose, Bld: 107 mg/dL — ABNORMAL HIGH (ref 65–99)
HCT: 43 % (ref 36.0–46.0)
HCT: 29 % — ABNORMAL LOW (ref 36.0–46.0)
HCT: 40 % (ref 36.0–46.0)
HCT: 29 % — ABNORMAL LOW (ref 36.0–46.0)
HCT: 27 % — ABNORMAL LOW (ref 36.0–46.0)
HCT: 33 % — ABNORMAL LOW (ref 36.0–46.0)
Hemoglobin: 14.6 g/dL (ref 12.0–15.0)
Hemoglobin: 13.6 g/dL (ref 12.0–15.0)
Hemoglobin: 9.9 g/dL — ABNORMAL LOW (ref 12.0–15.0)
Hemoglobin: 9.9 g/dL — ABNORMAL LOW (ref 12.0–15.0)
Hemoglobin: 9.2 g/dL — ABNORMAL LOW (ref 12.0–15.0)
Hemoglobin: 11.2 g/dL — ABNORMAL LOW (ref 12.0–15.0)
Potassium: 3.9 mmol/L (ref 3.5–5.1)
Potassium: 3.5 mmol/L (ref 3.5–5.1)
Potassium: 3.8 mmol/L (ref 3.5–5.1)
Potassium: 5.2 mmol/L — ABNORMAL HIGH (ref 3.5–5.1)
Potassium: 4.9 mmol/L (ref 3.5–5.1)
Potassium: 4.7 mmol/L (ref 3.5–5.1)
Sodium: 143 mmol/L (ref 135–145)
Sodium: 141 mmol/L (ref 135–145)
Sodium: 143 mmol/L (ref 135–145)
Sodium: 138 mmol/L (ref 135–145)
Sodium: 140 mmol/L (ref 135–145)
Sodium: 141 mmol/L (ref 135–145)
TCO2: 24 mmol/L (ref 0–100)
TCO2: 23 mmol/L (ref 0–100)
TCO2: 29 mmol/L (ref 0–100)
TCO2: 26 mmol/L (ref 0–100)
TCO2: 24 mmol/L (ref 0–100)
TCO2: 24 mmol/L (ref 0–100)

## 2016-11-16 LAB — POCT I-STAT 3, ART BLOOD GAS (G3+)
Acid-Base Excess: 2 mmol/L (ref 0.0–2.0)
Acid-base deficit: 2 mmol/L (ref 0.0–2.0)
Acid-base deficit: 4 mmol/L — ABNORMAL HIGH (ref 0.0–2.0)
Acid-base deficit: 5 mmol/L — ABNORMAL HIGH (ref 0.0–2.0)
Bicarbonate: 25.8 mmol/L (ref 20.0–28.0)
Bicarbonate: 22.4 mmol/L (ref 20.0–28.0)
Bicarbonate: 20.5 mmol/L (ref 20.0–28.0)
Bicarbonate: 20.4 mmol/L (ref 20.0–28.0)
O2 Saturation: 100 %
O2 Saturation: 100 %
O2 Saturation: 98 %
O2 Saturation: 98 %
Patient temperature: 34.6
Patient temperature: 36.5
TCO2: 27 mmol/L (ref 0–100)
TCO2: 23 mmol/L (ref 0–100)
TCO2: 21 mmol/L (ref 0–100)
TCO2: 22 mmol/L (ref 0–100)
pCO2 arterial: 38.2 mmHg (ref 32.0–48.0)
pCO2 arterial: 34.8 mmHg (ref 32.0–48.0)
pCO2 arterial: 29.9 mmHg — ABNORMAL LOW (ref 32.0–48.0)
pCO2 arterial: 37.7 mmHg (ref 32.0–48.0)
pH, Arterial: 7.438 (ref 7.350–7.450)
pH, Arterial: 7.417 (ref 7.350–7.450)
pH, Arterial: 7.433 (ref 7.350–7.450)
pH, Arterial: 7.339 — ABNORMAL LOW (ref 7.350–7.450)
pO2, Arterial: 417 mmHg — ABNORMAL HIGH (ref 83.0–108.0)
pO2, Arterial: 367 mmHg — ABNORMAL HIGH (ref 83.0–108.0)
pO2, Arterial: 97 mmHg (ref 83.0–108.0)
pO2, Arterial: 106 mmHg (ref 83.0–108.0)

## 2016-11-16 LAB — CBC
HCT: 31.3 % — ABNORMAL LOW (ref 36.0–46.0)
HCT: 33.4 % — ABNORMAL LOW (ref 36.0–46.0)
Hemoglobin: 10.4 g/dL — ABNORMAL LOW (ref 12.0–15.0)
Hemoglobin: 11 g/dL — ABNORMAL LOW (ref 12.0–15.0)
MCH: 29.4 pg (ref 26.0–34.0)
MCH: 29 pg (ref 26.0–34.0)
MCHC: 33.2 g/dL (ref 30.0–36.0)
MCHC: 32.9 g/dL (ref 30.0–36.0)
MCV: 88.4 fL (ref 78.0–100.0)
MCV: 88.1 fL (ref 78.0–100.0)
Platelets: 160 10*3/uL (ref 150–400)
Platelets: 177 10*3/uL (ref 150–400)
RBC: 3.54 MIL/uL — ABNORMAL LOW (ref 3.87–5.11)
RBC: 3.79 MIL/uL — ABNORMAL LOW (ref 3.87–5.11)
RDW: 15.3 % (ref 11.5–15.5)
RDW: 15.2 % (ref 11.5–15.5)
WBC: 7.4 10*3/uL (ref 4.0–10.5)
WBC: 5.6 10*3/uL (ref 4.0–10.5)

## 2016-11-16 LAB — POCT I-STAT 4, (NA,K, GLUC, HGB,HCT)
Glucose, Bld: 93 mg/dL (ref 65–99)
HCT: 32 % — ABNORMAL LOW (ref 36.0–46.0)
Hemoglobin: 10.9 g/dL — ABNORMAL LOW (ref 12.0–15.0)
Potassium: 3.9 mmol/L (ref 3.5–5.1)
Sodium: 142 mmol/L (ref 135–145)

## 2016-11-16 LAB — CREATININE, SERUM
Creatinine, Ser: 0.81 mg/dL (ref 0.44–1.00)
GFR calc Af Amer: >60 mL/min (ref >60)
GFR calc non Af Amer: >60 mL/min (ref >60)

## 2016-11-16 LAB — GLUCOSE, CAPILLARY
Glucose-Capillary: 98 mg/dL (ref 65–99)
Glucose-Capillary: 100 mg/dL — ABNORMAL HIGH (ref 65–99)
Glucose-Capillary: 104 mg/dL — ABNORMAL HIGH (ref 65–99)
Glucose-Capillary: 109 mg/dL — ABNORMAL HIGH (ref 65–99)

## 2016-11-16 LAB — HEMOGLOBIN AND HEMATOCRIT, BLOOD
HCT: 27.9 % — ABNORMAL LOW (ref 36.0–46.0)
Hemoglobin: 9.4 g/dL — ABNORMAL LOW (ref 12.0–15.0)

## 2016-11-16 LAB — MAGNESIUM: Magnesium: 3.3 mg/dL — ABNORMAL HIGH (ref 1.7–2.4)

## 2016-11-16 LAB — APTT: aPTT: 38 seconds — ABNORMAL HIGH (ref 24–36)

## 2016-11-16 LAB — PLATELET COUNT: Platelets: 68 10*3/uL — ABNORMAL LOW (ref 150–400)

## 2016-11-16 SURGERY — REPLACEMENT, AORTIC VALVE, OPEN
Anesthesia: General | Site: Chest

## 2016-11-16 MED ORDER — ORAL CARE MOUTH RINSE
15.0000 mL | Freq: Two times a day (BID) | OROMUCOSAL | Status: DC
  Administered 2016-11-16 – 2016-11-22 (×7): 15 mL via OROMUCOSAL

## 2016-11-16 MED ORDER — ROSUVASTATIN CALCIUM 10 MG PO TABS
10.0000 mg | ORAL_TABLET | Freq: Every day | ORAL | Status: DC
  Administered 2016-11-17 – 2016-11-22 (×6): 10 mg via ORAL
  Filled 2016-11-16 (×6): qty 1

## 2016-11-16 MED ORDER — PHENYLEPHRINE 40 MCG/ML (10ML) SYRINGE FOR IV PUSH (FOR BLOOD PRESSURE SUPPORT)
PREFILLED_SYRINGE | INTRAVENOUS | Status: AC
  Filled 2016-11-16: qty 10

## 2016-11-16 MED ORDER — BISACODYL 5 MG PO TBEC
10.0000 mg | DELAYED_RELEASE_TABLET | Freq: Every day | ORAL | Status: DC
  Administered 2016-11-17 – 2016-11-19 (×3): 10 mg via ORAL
  Filled 2016-11-16 (×3): qty 2

## 2016-11-16 MED ORDER — ACETAMINOPHEN 500 MG PO TABS
1000.0000 mg | ORAL_TABLET | Freq: Four times a day (QID) | ORAL | Status: DC
  Administered 2016-11-17 – 2016-11-19 (×6): 1000 mg via ORAL
  Filled 2016-11-16 (×6): qty 2

## 2016-11-16 MED ORDER — FENTANYL CITRATE (PF) 250 MCG/5ML IJ SOLN
INTRAMUSCULAR | Status: AC
  Filled 2016-11-16: qty 5

## 2016-11-16 MED ORDER — THROMBIN 20000 UNITS EX SOLR
CUTANEOUS | Status: DC | PRN
  Administered 2016-11-16: 20000 [IU] via TOPICAL

## 2016-11-16 MED ORDER — GELATIN ABSORBABLE MT POWD
OROMUCOSAL | Status: DC | PRN
  Administered 2016-11-16 (×3): 4 mL via TOPICAL

## 2016-11-16 MED ORDER — LACTATED RINGERS IV SOLN
INTRAVENOUS | Status: DC | PRN
  Administered 2016-11-16: 07:00:00 via INTRAVENOUS

## 2016-11-16 MED ORDER — METOPROLOL TARTRATE 12.5 MG HALF TABLET: 12.5000 mg | ORAL_TABLET | Freq: Two times a day (BID) | ORAL | Status: DC

## 2016-11-16 MED ORDER — METOPROLOL TARTRATE 25 MG/10 ML ORAL SUSPENSION: 12.5000 mg | Freq: Two times a day (BID) | ORAL | Status: DC

## 2016-11-16 MED ORDER — LACTATED RINGERS IV SOLN
INTRAVENOUS | Status: DC | PRN
  Administered 2016-11-16 (×2): via INTRAVENOUS

## 2016-11-16 MED ORDER — INSULIN REGULAR BOLUS VIA INFUSION
0.0000 [IU] | Freq: Three times a day (TID) | INTRAVENOUS | Status: DC
  Filled 2016-11-16: qty 10

## 2016-11-16 MED ORDER — METOPROLOL TARTRATE 5 MG/5ML IV SOLN: 2.5000 mg | INTRAVENOUS | Status: DC | PRN

## 2016-11-16 MED ORDER — NITROGLYCERIN IN D5W 200-5 MCG/ML-% IV SOLN
0.0000 ug/min | INTRAVENOUS | Status: DC
  Administered 2016-11-18: 5 ug/min via INTRAVENOUS
  Filled 2016-11-16: qty 250

## 2016-11-16 MED ORDER — FENTANYL CITRATE (PF) 250 MCG/5ML IJ SOLN
INTRAMUSCULAR | Status: AC
Start: 2016-11-16 — End: 2016-11-16
  Filled 2016-11-16: qty 5

## 2016-11-16 MED ORDER — ACETAMINOPHEN 650 MG RE SUPP
650.0000 mg | Freq: Once | RECTAL | Status: AC
  Administered 2016-11-16: 650 mg via RECTAL

## 2016-11-16 MED ORDER — FENTANYL CITRATE (PF) 250 MCG/5ML IJ SOLN
INTRAMUSCULAR | Status: DC | PRN
  Administered 2016-11-16: 50 ug via INTRAVENOUS
  Administered 2016-11-16: 250 ug via INTRAVENOUS
  Administered 2016-11-16: 100 ug via INTRAVENOUS
  Administered 2016-11-16: 150 ug via INTRAVENOUS
  Administered 2016-11-16 (×5): 50 ug via INTRAVENOUS
  Administered 2016-11-16: 100 ug via INTRAVENOUS
  Administered 2016-11-16: 250 ug via INTRAVENOUS
  Administered 2016-11-16 (×2): 50 ug via INTRAVENOUS
  Administered 2016-11-16 (×3): 100 ug via INTRAVENOUS
  Administered 2016-11-16: 150 ug via INTRAVENOUS
  Administered 2016-11-16: 50 ug via INTRAVENOUS
  Administered 2016-11-16: 250 ug via INTRAVENOUS

## 2016-11-16 MED ORDER — MORPHINE SULFATE (PF) 2 MG/ML IV SOLN: 1.0000 mg | INTRAVENOUS | Status: AC | PRN

## 2016-11-16 MED ORDER — SODIUM CHLORIDE 0.9 % IV SOLN
INTRAVENOUS | Status: DC
  Filled 2016-11-16: qty 2.5

## 2016-11-16 MED ORDER — ROCURONIUM BROMIDE 50 MG/5ML IV SOSY
PREFILLED_SYRINGE | INTRAVENOUS | Status: AC
  Filled 2016-11-16: qty 5

## 2016-11-16 MED ORDER — OXYCODONE HCL 5 MG PO TABS
5.0000 mg | ORAL_TABLET | ORAL | Status: DC | PRN
  Administered 2016-11-17 (×2): 5 mg via ORAL
  Administered 2016-11-18: 10 mg via ORAL
  Filled 2016-11-16 (×2): qty 1
  Filled 2016-11-16: qty 2

## 2016-11-16 MED ORDER — MORPHINE SULFATE (PF) 2 MG/ML IV SOLN
2.0000 mg | INTRAVENOUS | Status: DC | PRN
  Administered 2016-11-17 – 2016-11-18 (×3): 2 mg via INTRAVENOUS
  Filled 2016-11-16 (×3): qty 1

## 2016-11-16 MED ORDER — PROPOFOL 10 MG/ML IV BOLUS
INTRAVENOUS | Status: DC | PRN
  Administered 2016-11-16: 30 mg via INTRAVENOUS

## 2016-11-16 MED ORDER — FAMOTIDINE IN NACL 20-0.9 MG/50ML-% IV SOLN
20.0000 mg | Freq: Two times a day (BID) | INTRAVENOUS | Status: AC
  Administered 2016-11-16: 20 mg via INTRAVENOUS

## 2016-11-16 MED ORDER — MIDAZOLAM HCL 5 MG/5ML IJ SOLN
INTRAMUSCULAR | Status: DC | PRN
  Administered 2016-11-16: 3 mg via INTRAVENOUS
  Administered 2016-11-16 (×2): 1 mg via INTRAVENOUS

## 2016-11-16 MED ORDER — ROCURONIUM BROMIDE 50 MG/5ML IV SOSY
PREFILLED_SYRINGE | INTRAVENOUS | Status: AC
  Filled 2016-11-16: qty 15

## 2016-11-16 MED ORDER — ORAL CARE MOUTH RINSE
15.0000 mL | OROMUCOSAL | Status: DC
  Administered 2016-11-16 (×2): 15 mL via OROMUCOSAL

## 2016-11-16 MED ORDER — ADULT MULTIVITAMIN W/MINERALS CH
1.0000 | ORAL_TABLET | Freq: Every day | ORAL | Status: DC
  Administered 2016-11-17 – 2016-11-21 (×5): 1 via ORAL
  Filled 2016-11-16 (×5): qty 1

## 2016-11-16 MED ORDER — DEXTROSE 5 % IV SOLN
1.5000 g | Freq: Two times a day (BID) | INTRAVENOUS | Status: AC
  Administered 2016-11-16 – 2016-11-18 (×4): 1.5 g via INTRAVENOUS
  Filled 2016-11-16 (×4): qty 1.5

## 2016-11-16 MED ORDER — MIDAZOLAM HCL 2 MG/2ML IJ SOLN: 2.0000 mg | INTRAMUSCULAR | Status: DC | PRN

## 2016-11-16 MED ORDER — INSULIN REGULAR HUMAN 100 UNIT/ML IJ SOLN
INTRAMUSCULAR | Status: DC | PRN
  Administered 2016-11-16: 1 [IU]/h via INTRAVENOUS

## 2016-11-16 MED ORDER — ASPIRIN 81 MG PO CHEW: 324.0000 mg | CHEWABLE_TABLET | Freq: Every day | ORAL | Status: DC

## 2016-11-16 MED ORDER — INSULIN ASPART 100 UNIT/ML ~~LOC~~ SOLN: 0.0000 [IU] | SUBCUTANEOUS | Status: DC

## 2016-11-16 MED ORDER — ACETAMINOPHEN 160 MG/5ML PO SOLN
650.0000 mg | Freq: Once | ORAL | Status: AC
Start: 2016-11-16 — End: 2016-11-16

## 2016-11-16 MED ORDER — HEPARIN SODIUM (PORCINE) 1000 UNIT/ML IJ SOLN
INTRAMUSCULAR | Status: DC | PRN
  Administered 2016-11-16: 18 mL via INTRAVENOUS

## 2016-11-16 MED ORDER — PANTOPRAZOLE SODIUM 40 MG PO TBEC
40.0000 mg | DELAYED_RELEASE_TABLET | Freq: Every day | ORAL | Status: DC
  Administered 2016-11-18 – 2016-11-19 (×2): 40 mg via ORAL
  Filled 2016-11-16 (×2): qty 1

## 2016-11-16 MED ORDER — ASPIRIN EC 325 MG PO TBEC
325.0000 mg | DELAYED_RELEASE_TABLET | Freq: Every day | ORAL | Status: DC
  Administered 2016-11-17 – 2016-11-19 (×3): 325 mg via ORAL
  Filled 2016-11-16 (×3): qty 1

## 2016-11-16 MED ORDER — ONDANSETRON HCL 4 MG/2ML IJ SOLN: 4.0000 mg | Freq: Four times a day (QID) | INTRAMUSCULAR | Status: DC | PRN

## 2016-11-16 MED ORDER — PROPOFOL 10 MG/ML IV BOLUS
INTRAVENOUS | Status: AC
  Filled 2016-11-16: qty 20

## 2016-11-16 MED ORDER — PHENYLEPHRINE HCL 10 MG/ML IJ SOLN
INTRAVENOUS | Status: DC | PRN
  Administered 2016-11-16: 20 ug/min via INTRAVENOUS

## 2016-11-16 MED ORDER — SODIUM CHLORIDE 0.45 % IV SOLN
INTRAVENOUS | Status: DC | PRN
  Administered 2016-11-16: 14:00:00 via INTRAVENOUS

## 2016-11-16 MED ORDER — SODIUM CHLORIDE 0.9 % IV SOLN
Freq: Once | INTRAVENOUS | Status: AC
  Administered 2016-11-16: 12:00:00 via INTRAVENOUS

## 2016-11-16 MED ORDER — SODIUM CHLORIDE 0.9 % IV SOLN
0.0000 ug/min | INTRAVENOUS | Status: DC
  Filled 2016-11-16: qty 2

## 2016-11-16 MED ORDER — LACTATED RINGERS IV SOLN: 500.0000 mL | Freq: Once | INTRAVENOUS | Status: DC | PRN

## 2016-11-16 MED ORDER — SODIUM CHLORIDE 0.9 % IV SOLN
INTRAVENOUS | Status: DC | PRN
  Administered 2016-11-16: 0.2 ug/kg/h via INTRAVENOUS

## 2016-11-16 MED ORDER — LACTATED RINGERS IV SOLN
INTRAVENOUS | Status: DC
  Administered 2016-11-16 (×2): via INTRAVENOUS

## 2016-11-16 MED ORDER — HEMOSTATIC AGENTS (NO CHARGE) OPTIME
TOPICAL | Status: DC | PRN
  Administered 2016-11-16 (×2): 1 via TOPICAL

## 2016-11-16 MED ORDER — MIDAZOLAM HCL 10 MG/2ML IJ SOLN
INTRAMUSCULAR | Status: AC
  Filled 2016-11-16: qty 2

## 2016-11-16 MED ORDER — DOCUSATE SODIUM 100 MG PO CAPS
200.0000 mg | ORAL_CAPSULE | Freq: Every day | ORAL | Status: DC
  Administered 2016-11-17 – 2016-11-19 (×3): 200 mg via ORAL
  Filled 2016-11-16 (×3): qty 2

## 2016-11-16 MED ORDER — CHLORHEXIDINE GLUCONATE 0.12% ORAL RINSE (MEDLINE KIT)
15.0000 mL | Freq: Two times a day (BID) | OROMUCOSAL | Status: DC
  Administered 2016-11-16: 15 mL via OROMUCOSAL

## 2016-11-16 MED ORDER — SODIUM CHLORIDE 0.9% FLUSH
3.0000 mL | INTRAVENOUS | Status: DC | PRN
  Administered 2016-11-17: 3 mL via INTRAVENOUS
  Filled 2016-11-16: qty 3

## 2016-11-16 MED ORDER — ROCURONIUM BROMIDE 100 MG/10ML IV SOLN
INTRAVENOUS | Status: DC | PRN
  Administered 2016-11-16 (×3): 50 mg via INTRAVENOUS

## 2016-11-16 MED ORDER — SODIUM CHLORIDE 0.9 % IV SOLN
30.0000 meq | Freq: Once | INTRAVENOUS | Status: AC
  Administered 2016-11-16: 30 meq via INTRAVENOUS
  Filled 2016-11-16 (×2): qty 15

## 2016-11-16 MED ORDER — PROTAMINE SULFATE 10 MG/ML IV SOLN
INTRAVENOUS | Status: DC | PRN
  Administered 2016-11-16: 10 mg via INTRAVENOUS
  Administered 2016-11-16: 190 mg via INTRAVENOUS

## 2016-11-16 MED ORDER — THROMBIN 20000 UNITS EX SOLR
CUTANEOUS | Status: AC
  Filled 2016-11-16: qty 20000

## 2016-11-16 MED ORDER — VANCOMYCIN HCL IN DEXTROSE 1-5 GM/200ML-% IV SOLN
1000.0000 mg | Freq: Once | INTRAVENOUS | Status: AC
  Administered 2016-11-16: 1000 mg via INTRAVENOUS
  Filled 2016-11-16: qty 200

## 2016-11-16 MED ORDER — MAGNESIUM SULFATE 4 GM/100ML IV SOLN
4.0000 g | Freq: Once | INTRAVENOUS | Status: AC
  Administered 2016-11-16: 4 g via INTRAVENOUS
  Filled 2016-11-16: qty 100

## 2016-11-16 MED ORDER — TRAMADOL HCL 50 MG PO TABS: 50.0000 mg | ORAL_TABLET | ORAL | Status: DC | PRN

## 2016-11-16 MED ORDER — SODIUM CHLORIDE 0.9% FLUSH
3.0000 mL | Freq: Two times a day (BID) | INTRAVENOUS | Status: DC
  Administered 2016-11-17 – 2016-11-19 (×5): 3 mL via INTRAVENOUS

## 2016-11-16 MED ORDER — BISACODYL 10 MG RE SUPP: 10.0000 mg | Freq: Every day | RECTAL | Status: DC

## 2016-11-16 MED ORDER — DEXMEDETOMIDINE HCL IN NACL 200 MCG/50ML IV SOLN: 0.0000 ug/kg/h | INTRAVENOUS | Status: DC

## 2016-11-16 MED ORDER — SODIUM CHLORIDE 0.9 % IV SOLN: 250.0000 mL | INTRAVENOUS | Status: DC

## 2016-11-16 MED ORDER — LACTATED RINGERS IV SOLN
INTRAVENOUS | Status: DC
  Administered 2016-11-16: 13:00:00 via INTRAVENOUS

## 2016-11-16 MED ORDER — ALBUMIN HUMAN 5 % IV SOLN
250.0000 mL | INTRAVENOUS | Status: AC | PRN
  Administered 2016-11-16 (×3): 250 mL via INTRAVENOUS
  Filled 2016-11-16: qty 250

## 2016-11-16 MED ORDER — 0.9 % SODIUM CHLORIDE (POUR BTL) OPTIME
TOPICAL | Status: DC | PRN
  Administered 2016-11-16: 6000 mL

## 2016-11-16 MED ORDER — SODIUM CHLORIDE 0.9 % IV SOLN
INTRAVENOUS | Status: DC
  Administered 2016-11-16: 13:00:00 via INTRAVENOUS

## 2016-11-16 MED ORDER — ACETAMINOPHEN 160 MG/5ML PO SOLN
1000.0000 mg | Freq: Four times a day (QID) | ORAL | Status: DC
  Administered 2016-11-18 (×2): 1000 mg
  Filled 2016-11-16 (×2): qty 40.6

## 2016-11-16 MED ORDER — ETOMIDATE 2 MG/ML IV SOLN
INTRAVENOUS | Status: DC | PRN
  Administered 2016-11-16: 20 mg via INTRAVENOUS

## 2016-11-16 MED ORDER — CHLORHEXIDINE GLUCONATE 0.12 % MT SOLN
15.0000 mL | OROMUCOSAL | Status: AC
  Administered 2016-11-16: 15 mL via OROMUCOSAL

## 2016-11-16 MED FILL — Magnesium Sulfate Inj 50%: INTRAMUSCULAR | Qty: 10 | Status: AC

## 2016-11-16 MED FILL — Heparin Sodium (Porcine) Inj 1000 Unit/ML: INTRAMUSCULAR | Qty: 30 | Status: AC

## 2016-11-16 MED FILL — Potassium Chloride Inj 2 mEq/ML: INTRAVENOUS | Qty: 40 | Status: AC

## 2016-11-16 SURGICAL SUPPLY — 90 items
ADAPTER CARDIO PERF ANTE/RETRO (ADAPTER) ×3 IMPLANT
BAG DECANTER FOR FLEXI CONT (MISCELLANEOUS) IMPLANT
BLADE STERNUM SYSTEM 6 (BLADE) ×3 IMPLANT
BLADE SURG 15 STRL LF DISP TIS (BLADE) ×2 IMPLANT
BLADE SURG 15 STRL SS (BLADE) ×1
CANISTER SUCTION 2500CC (MISCELLANEOUS) ×3 IMPLANT
CANNULA GUNDRY RCSP 15FR (MISCELLANEOUS) ×3 IMPLANT
CATH HEART VENT LEFT (CATHETERS) ×2 IMPLANT
CATH ROBINSON RED A/P 18FR (CATHETERS) ×9 IMPLANT
CATH THORACIC 36FR (CATHETERS) ×3 IMPLANT
CATH THORACIC 36FR RT ANG (CATHETERS) ×3 IMPLANT
CONT SPEC 4OZ CLIKSEAL STRL BL (MISCELLANEOUS) ×3 IMPLANT
CONT SPEC STER OR (MISCELLANEOUS) ×3 IMPLANT
COVER MAYO STAND STRL (DRAPES) ×3 IMPLANT
COVER SURGICAL LIGHT HANDLE (MISCELLANEOUS) IMPLANT
CRADLE DONUT ADULT HEAD (MISCELLANEOUS) ×3 IMPLANT
DRAPE SLUSH/WARMER DISC (DRAPES) ×3 IMPLANT
DRSG COVADERM 4X14 (GAUZE/BANDAGES/DRESSINGS) ×3 IMPLANT
ELECT CAUTERY BLADE 6.4 (BLADE) ×3 IMPLANT
ELECT REM PT RETURN 9FT ADLT (ELECTROSURGICAL) ×6
ELECTRODE REM PT RTRN 9FT ADLT (ELECTROSURGICAL) ×4 IMPLANT
FELT TEFLON 1X6 (MISCELLANEOUS) ×3 IMPLANT
GAUZE SPONGE 4X4 12PLY STRL (GAUZE/BANDAGES/DRESSINGS) ×3 IMPLANT
GLOVE BIO SURGEON STRL SZ 6 (GLOVE) ×6 IMPLANT
GLOVE BIO SURGEON STRL SZ 6.5 (GLOVE) ×3 IMPLANT
GLOVE BIO SURGEON STRL SZ7 (GLOVE) IMPLANT
GLOVE BIO SURGEON STRL SZ7.5 (GLOVE) IMPLANT
GLOVE BIO SURGEON STRL SZ8 (GLOVE) ×3 IMPLANT
GLOVE BIOGEL PI IND STRL 6 (GLOVE) ×4 IMPLANT
GLOVE BIOGEL PI IND STRL 6.5 (GLOVE) ×4 IMPLANT
GLOVE BIOGEL PI IND STRL 7.0 (GLOVE) ×4 IMPLANT
GLOVE BIOGEL PI INDICATOR 6 (GLOVE) ×2
GLOVE BIOGEL PI INDICATOR 6.5 (GLOVE) ×2
GLOVE BIOGEL PI INDICATOR 7.0 (GLOVE) ×2
GLOVE EUDERMIC 7 POWDERFREE (GLOVE) ×6 IMPLANT
GOWN STRL REUS W/ TWL LRG LVL3 (GOWN DISPOSABLE) ×12 IMPLANT
GOWN STRL REUS W/ TWL XL LVL3 (GOWN DISPOSABLE) ×2 IMPLANT
GOWN STRL REUS W/TWL LRG LVL3 (GOWN DISPOSABLE) ×6
GOWN STRL REUS W/TWL XL LVL3 (GOWN DISPOSABLE) ×1
HEART VENT LT CURVED (MISCELLANEOUS) ×3 IMPLANT
HEMOSTAT POWDER SURGIFOAM 1G (HEMOSTASIS) ×9 IMPLANT
HEMOSTAT SURGICEL 2X14 (HEMOSTASIS) ×6 IMPLANT
KIT BASIN OR (CUSTOM PROCEDURE TRAY) ×3 IMPLANT
KIT CATH CPB BARTLE (MISCELLANEOUS) ×3 IMPLANT
KIT ROOM TURNOVER OR (KITS) ×3 IMPLANT
KIT SUCTION CATH 14FR (SUCTIONS) ×3 IMPLANT
LINE VENT (MISCELLANEOUS) ×3 IMPLANT
NS IRRIG 1000ML POUR BTL (IV SOLUTION) ×18 IMPLANT
PACK OPEN HEART (CUSTOM PROCEDURE TRAY) ×3 IMPLANT
PAD ARMBOARD 7.5X6 YLW CONV (MISCELLANEOUS) ×6 IMPLANT
SET CARDIOPLEGIA MPS 5001102 (MISCELLANEOUS) ×3 IMPLANT
SUT BONE WAX W31G (SUTURE) ×3 IMPLANT
SUT ETHIBON 2 0 V 52N 30 (SUTURE) ×12 IMPLANT
SUT ETHIBON EXCEL 2-0 V-5 (SUTURE) ×24 IMPLANT
SUT ETHIBOND 2 0 SH (SUTURE) ×4
SUT ETHIBOND 2 0 SH 36X2 (SUTURE) ×8 IMPLANT
SUT ETHIBOND V-5 VALVE (SUTURE) ×24 IMPLANT
SUT PROLENE 3 0 SH DA (SUTURE) IMPLANT
SUT PROLENE 3 0 SH1 36 (SUTURE) ×3 IMPLANT
SUT PROLENE 4 0 RB 1 (SUTURE) ×5
SUT PROLENE 4-0 RB1 .5 CRCL 36 (SUTURE) ×10 IMPLANT
SUT PROLENE 5 0 C 1 36 (SUTURE) ×6 IMPLANT
SUT PROLENE 6 0 C 1 30 (SUTURE) ×6 IMPLANT
SUT PROLENE 8 0 BV175 6 (SUTURE) ×6 IMPLANT
SUT SILK  1 MH (SUTURE) ×3
SUT SILK 1 MH (SUTURE) ×6 IMPLANT
SUT SILK 1 TIES 10X30 (SUTURE) ×3 IMPLANT
SUT SILK 2 0 SH CR/8 (SUTURE) ×6 IMPLANT
SUT SILK 2 0 TIES 10X30 (SUTURE) ×3 IMPLANT
SUT SILK 2 0 TIES 17X18 (SUTURE) ×1
SUT SILK 2-0 18XBRD TIE BLK (SUTURE) ×2 IMPLANT
SUT SILK 3 0 SH CR/8 (SUTURE) ×3 IMPLANT
SUT SILK 4 0 TIE 10X30 (SUTURE) ×6 IMPLANT
SUT STEEL 6MS V (SUTURE) IMPLANT
SUT TEM PAC WIRE 2 0 SH (SUTURE) ×18 IMPLANT
SUT VIC AB 1 CTX 36 (SUTURE) ×3
SUT VIC AB 1 CTX36XBRD ANBCTR (SUTURE) ×6 IMPLANT
SUT VIC AB 2-0 CTX 27 (SUTURE) ×6 IMPLANT
SUT VIC AB 3-0 X1 27 (SUTURE) ×6 IMPLANT
SUTURE E-PAK OPEN HEART (SUTURE) IMPLANT
SYSTEM SAHARA CHEST DRAIN ATS (WOUND CARE) ×3 IMPLANT
TOWEL GREEN STERILE (TOWEL DISPOSABLE) IMPLANT
TOWEL GREEN STERILE FF (TOWEL DISPOSABLE) ×3 IMPLANT
TOWEL OR 17X24 6PK STRL BLUE (TOWEL DISPOSABLE) ×6 IMPLANT
TOWEL OR 17X26 10 PK STRL BLUE (TOWEL DISPOSABLE) ×6 IMPLANT
TRAY FOLEY IC TEMP SENS 16FR (CATHETERS) ×3 IMPLANT
UNDERPAD 30X30 (UNDERPADS AND DIAPERS) ×3 IMPLANT
VALVE MAGNA EASE AORTIC 19MM (Prosthesis & Implant Heart) ×3 IMPLANT
VENT LEFT HEART 12002 (CATHETERS) ×3
WATER STERILE IRR 1000ML POUR (IV SOLUTION) ×6 IMPLANT

## 2016-11-16 NOTE — Transfer of Care (Signed)
Immediate Anesthesia Transfer of Care Note  Patient: [name]  Procedure(s) Performed: Procedure(s) with comments: AORTIC VALVE REPLACEMENT (AVR) (N/A) - 34mm Perimount Magna Ease Pericardial Bioprosthesis TRANSESOPHAGEAL ECHOCARDIOGRAM (TEE) (N/A)  Patient Location: SICU  Anesthesia Type:General  Level of Consciousness: sedated and Patient remains intubated per anesthesia plan  Airway & Oxygen Therapy: Patient remains intubated per anesthesia plan and Patient placed on Ventilator (see vital sign flow sheet for setting)  Post-op Assessment: Report given to RN and Post -op Vital signs reviewed and stable  Post vital signs: Reviewed and stable  Last Vitals:  Vitals:   11/16/16 0559  BP: (!) 183/64  Pulse: 67  Resp: 20  Temp: 36.3 C    Last Pain:  Vitals:   11/16/16 0559  TempSrc: Oral      Patients Stated Pain Goal: 2 (80/88/11 0315)  Complications: No apparent anesthesia complications

## 2016-11-16 NOTE — OR Nursing (Signed)
Forty-five minute call to SICU charge nurse at 1138. Spoke to [place].

## 2016-11-16 NOTE — Progress Notes (Signed)
[facility] BRIEF SICU PROGRESS NOTE  Day of Surgery  S/P Procedure(s) (LRB): AORTIC VALVE REPLACEMENT (AVR) (N/A) TRANSESOPHAGEAL ECHOCARDIOGRAM (TEE) (N/A)   Sedated on vent NSR w/ stable hemodynamics O2 sats 100% Chest tube output low UOP adequate Labs okay  Plan: Continue routine early postop  Rexene Alberts, MD 11/16/2016 6:13 PM

## 2016-11-16 NOTE — Brief Op Note (Signed)
11/16/2016  11:12 AM  PATIENT:  [name]  61 y.o. female  PRE-OPERATIVE DIAGNOSIS:  SEVERE AS  POST-OPERATIVE DIAGNOSIS:  SEVERE AS  PROCEDURE:  TRANSESOPHAGEAL ECHOCARDIOGRAM (TEE), MEDIA STERNOTOMY for AORTIC VALVE REPLACEMENT (AVR)  (using a  Perimount Magna Ease Pericardial Bioprosthesis, size 19)  SURGEON:  Surgeon(s) and Role:    * Gaye Pollack, MD - Primary  PHYSICIAN ASSISTANT: Lars Pinks PA-C  ANESTHESIA:   general  EBL:  Total I/O In: 2000 [I.V.:2000] Out: 875 [Urine:875]  BLOOD ADMINISTERED:Two PLTS  DRAINS: Chest tubes placed in the mediastinal and pleural spaces   SPECIMEN:  Source of Specimen:  Native AV leaflets  DISPOSITION OF SPECIMEN:  PATHOLOGY  COUNTS CORRECT:  YES  DICTATION: .Dragon Dictation  PLAN OF CARE: Admit to inpatient   PATIENT DISPOSITION:  ICU - intubated and hemodynamically stable.   Delay start of Pharmacological VTE agent (>24hrs) due to surgical blood loss or risk of bleeding: yes  BASELINE WEIGHT: 61 kg

## 2016-11-16 NOTE — Interval H&P Note (Signed)
History and Physical Interval Note:  11/16/2016 5:52 AM  [name]  has presented today for surgery, with the diagnosis of SEVERE AS  The various methods of treatment have been discussed with the patient and family. After consideration of risks, benefits and other options for treatment, the patient has consented to  Procedure(s): AORTIC VALVE REPLACEMENT (AVR) (N/A) TRANSESOPHAGEAL ECHOCARDIOGRAM (TEE) (N/A) as a surgical intervention .  The patient's history has been reviewed, patient examined, no change in status, stable for surgery.  I have reviewed the patient's chart and labs.  Questions were answered to the patient's satisfaction.     Gaye Pollack

## 2016-11-16 NOTE — Procedures (Addendum)
Extubation Procedure Note  Cardiac rapid wean complete w/o complication Pt follows all commands NIF: -22 VC: 820 No cuff leak (RN called and Dr. Cyndia Bent wanted to proceed with extubation) No Stridor Clearly speaks name/location Coughs/clears secretions Tolerating 4L [place]  Pt working with IS   Patient Details:   Name: [name] DOB: 1956/02/17 MRN: [id number]   Airway Documentation:     Evaluation  O2 sats: stable throughout Complications: No apparent complications Patient did tolerate procedure well. Bilateral Breath Sounds: Clear, Diminished   Yes  Sharen Hint 11/16/2016, 8:53 PM

## 2016-11-16 NOTE — Anesthesia Procedure Notes (Signed)
Central Venous Catheter Insertion Performed by: Marcie Bal Tris Howell, anesthesiologist Start/End2/22/2018 7:08 AM, 11/16/2016 7:18 AM Patient location: Pre-op. Preanesthetic checklist: patient identified, IV checked, site marked, risks and benefits discussed, surgical consent, monitors and equipment checked, pre-op evaluation, timeout performed and anesthesia consent Position: Trendelenburg Lidocaine 1% used for infiltration and patient sedated Hand hygiene performed , maximum sterile barriers used  and Seldinger technique used Catheter size: 8.5 Fr Central line and PA cath was placed.Sheath introducer Swan type:thermodilation Procedure performed using ultrasound guided technique. Ultrasound Notes:anatomy identified, needle tip was noted to be adjacent to the nerve/plexus identified, no ultrasound evidence of intravascular and/or intraneural injection and image(s) printed for medical record Attempts: 1 Following insertion, line sutured, dressing applied and Biopatch. Post procedure assessment: blood return through all ports, free fluid flow and no air  Patient tolerated the procedure well with no immediate complications.

## 2016-11-16 NOTE — Progress Notes (Signed)
RT NOTE:   CPAP/PS initiated. Pt follows commands

## 2016-11-16 NOTE — Anesthesia Procedure Notes (Signed)
Performed by: Maddoxx Burkitt P       

## 2016-11-16 NOTE — Plan of Care (Signed)
Problem: Activity: Goal: Risk for activity intolerance will decrease Outcome: Progressing Pt was able to tolerate dangling at the bedside well.  Problem: Respiratory: Goal: Ability to tolerate decreased levels of ventilator support will improve Outcome: Completed/Met Date Met: 11/16/16 Pt is extubated and tolerating 4L nasal cannula well  Problem: Skin Integrity: Goal: Risk for impaired skin integrity will decrease Outcome: Progressing Pt has a sacral foam in place to prevent pressure ulcers

## 2016-11-16 NOTE — Progress Notes (Signed)
  Echocardiogram Echocardiogram Transesophageal has been performed.  Bobbye Charleston 11/16/2016, 8:56 AM

## 2016-11-16 NOTE — OR Nursing (Signed)
Twenty minute call to SICU charge nurse at 1209. Spoke to [place].

## 2016-11-16 NOTE — Anesthesia Procedure Notes (Addendum)
Procedure Name: Intubation Date/Time: 11/16/2016 8:02 AM Performed by: Carney Living Pre-anesthesia Checklist: Patient identified, Emergency Drugs available, Suction available, Patient being monitored and Timeout performed Patient Re-evaluated:Patient Re-evaluated prior to inductionOxygen Delivery Method: Circle system utilized Preoxygenation: Pre-oxygenation with 100% oxygen Intubation Type: IV induction Ventilation: Mask ventilation without difficulty and Oral airway inserted - appropriate to patient size Laryngoscope Size: Sabra Heck and 2 Grade View: Grade I Tube type: Oral Tube size: 7.5 mm Number of attempts: 1 Airway Equipment and Method: Stylet Placement Confirmation: ETT inserted through vocal cords under direct vision,  positive ETCO2 and breath sounds checked- equal and bilateral Secured at: 22 cm Tube secured with: Tape Dental Injury: Teeth and Oropharynx as per pre-operative assessment  Comments: Intubation performed by Ronita Hipps, SRNA

## 2016-11-16 NOTE — Op Note (Signed)
CARDIOVASCULAR SURGERY OPERATIVE NOTE  11/16/2016 [name] [id number]  Surgeon:  Gaye Pollack, MD  First Assistant: Lars Pinks, PA-C   Preoperative Diagnosis:  Severe aortic stenosis   Postoperative Diagnosis:  Same   Procedure:  1. Median Sternotomy 2. Extracorporeal circulation 3.   Aortic valve replacement using a 19 mm Edwards Magna-Ease pericardial valve.  Anesthesia:  General Endotracheal   Clinical History/Surgical Indication:  The patient is a 61 year old woman with hypertension, hyperlipidemia and a remote history of non-ischemic cardiomyopathy dating back to 05/2002. She developed shortness of breath at that time and an echo showed an EF of 20-30% with mild AS, mild-moderate AI, and mild-moderate MR. She was treated medically with improvement and subsequently underwent implantation of a Bi-V ICD on 03/24/2005 for NICM with class III CHF and LBBB morphology. The etiology of her cardiomyopathy at that time was felt to be valvular heart disease and HTN. She says that she felt much better after that and a follow-up echo in 04/2008 showed an improvement in her EF to 65% with overall normal LV function, moderate AS with a mean gradient of 21 mm Hg and an AVA of 1.08 cm2 with mild to moderate AI, mild MR. She has a repeat echo in 02/2012 that showed an increased in the AV gradient to 42 mm Hg consistent with severe AS. The mean gradient was 38 mm Hg in 04/2014. She had an echo in 10/2015 that showed an increase in the mean gradient to 51 mm Hg with an AVA of 0.81 cm2 with moderate AI and normal LV function with moderate LVH. She was seen by Dr. Angelena Form at that time but was asymptomatic. A repeat echo in 05/2016 showed a mean gradient of 47 mm Hg with an AVA of 0.79 cm2 with mild AI and mild MR. She now says that she has developed exertional fatigue and tiredness.  She has stage D severe symptomatic aortic stenosis with development of exertional fatigue and tiredness. Her  echo reports document severe aortic stenosis back in 2013 but she was asymptomatic at that time. I have personally reviewed and interpreted her recent echo and cath. She has a trileaflet aortic valve that is severely calcified with restricted leaflet motion with a mean gradient of 47 mm Hg. Her LV function is normal and has been since 2009 with moderate concentric LVH despite having NICM and an EF of 20-30% back in 2003. The etiology of this is unclear. Her cath shows mild nonobstructive CAD with a 90% stenosis in the very distal RCA before a small PL branch and a 50% mid RCA lesion. I think this can be managed medically and does not require coronary bypass grafting. I think AVR is indicated for relief of symptoms and improvement in long term prognosis. I discussed the options for prosthesis including mechanical and tissue valves. She had been on coumadin for a long time since she was diagnosed with NICM and it was recently stopped prior to her cath with no plans to restart it since there is no indication for it. I think a tissue or mechanical valve would be fine at her age. She has never had any problems being on coumadin. She does describe some memory problems recently and it is not clear if this is related to her AS and feeling poorly in general or some early dementia. It is probably better to err on the side of caution and use a tissue valve. I discussed the operative procedure with the patient  and family including alternatives, benefits and risks; including but not limited to bleeding, blood transfusion, infection, stroke, myocardial infarction, heart block and pacer dependence, organ dysfunction, and death. Tiffany Pilgrim Ingramunderstands and agrees to proceed.    Preparation:  The patient was seen in the preoperative holding area and the correct patient, correct operation were confirmed with the patient after reviewing the medical record and catheterization. The consent was signed by me. Preoperative  antibiotics were given. A pulmonary arterial line and radial arterial line were placed by the anesthesia team. The patient was taken back to the operating room and positioned supine on the operating room table. After being placed under general endotracheal anesthesia by the anesthesia team a foley catheter was placed. The neck, chest, abdomen, and both legs were prepped with betadine soap and solution and draped in the usual sterile manner. A surgical time-out was taken and the correct patient and operative procedure were confirmed with the nursing and anesthesia staff.   Pre-bypass TEE:   Complete TEE assessment was performed by Dr. Marcie Bal. This showed severe calcific aortic stenosis with mild AI. LV function was normal with severe LVH. There was mild MR.     Post-bypass TEE:   Normal functioning prosthetic aortic valve with no perivalvular leak or regurgitation through the valve. Left ventricular function preserved. Unchanged mild mitral regurgitation.    Cardiopulmonary Bypass:  A median sternotomy was performed. The pericardium was opened in the midline. Right ventricular function appeared normal. The ascending aorta was of normal size and had no palpable plaque. There were no contraindications to aortic cannulation or cross-clamping. The patient was fully systemically heparinized and the ACT was maintained > 400 sec. The proximal aortic arch was cannulated with a 20 F aortic cannula for arterial inflow. Venous cannulation was performed via the right atrial appendage using a two-staged venous cannula. An antegrade cardioplegia/vent cannula was inserted into the mid-ascending aorta. A left ventricular vent was placed via the right superior pulmonary vein. A retrograde cardioplegia cannnula could not placed into the coronary sinus via the right atrium. I tried for about 15 minutes but could not get the catheter to engage the coronary sinus ostium. Aortic occlusion was performed with a single  cross-clamp. Systemic cooling to 32 degrees Centigrade and topical cooling of the heart with iced saline were used. Hyperkalemic antegrade cold blood cardioplegia was used to induce diastolic arrest and then cold blood antegrade cardioplegia was given directly into the coronary ostia at about 20 minute intervals throughout the period of arrest to maintain myocardial temperature at or below 10 degrees centigrade. A temperature probe was inserted into the interventricular septum and an insulating pad was placed in the pericardium. Carbon dioxide was insufflated into the pericardium at 5L/min throughout the procedure to minimize intracardiac air.   Aortic Valve Replacement:   A transverse aortotomy was performed 1 cm above the take-off of the right coronary artery. The posterior wall of the ascending aorta was calcified. There was a large amount of calcium around the STJ posteriorly. The native valve was tricuspid with calcified, immobile leaflets and moderate annular calcification. The ostia of the coronary arteries were in normal position and were not obstructed but did have circumferential calcified plaque around them. The native valve leaflets were excised and the annulus was decalcified with rongeurs. Care was taken to remove all particulate debris. The left ventricle was directly inspected for debris and then irrigated with ice saline solution. The annulus was sized and a size 19 mm  Oletta Lamas  Magna-Ease pericardial valve was chosen. The model number was [id number] and the serial number was [id number]. Since the patient's BSA was 1.6 I thought that a 19 mm pericardial valve would be fine. She had a lot of calcium in the aorta and around the coronary ostial which would have made root enlargement or replacement much more difficult and risky. While the valve was being prepared 2-0 Ethibond small-pledgeted horizontal mattress sutures were placed around the annulus with the pledgets in a sub-annular position. The sutures  were placed through the sewing ring and the valve lowered into place. The sutures were tied sequentially. The valve seated nicely and the coronary ostia were not obstructed. The prosthetic valve leaflets moved normally and there was no sub-valvular obstruction. The aortotomy was closed using 4-0 Prolene suture in 2 layers with felt strips to reinforce the closure.  Completion:  The patient was rewarmed to 37 degrees Centigrade. De-airing maneuvers were performed and the head placed in trendelenburg position. The crossclamp was removed with a time of 97 minutes. There was spontaneous return of sinus rhythm. The aortotomy was checked for hemostasis. Two temporary epicardial pacing wires were placed on the right atrium and two on the right ventricle. The left ventricular vent and retrograde cardioplegia cannulas were removed. The patient was weaned from CPB without difficulty on no inotropes. CPB time was 131 minutes. Cardiac output was 3.6 LPM. Heparin was fully reversed with protamine and the aortic and venous cannulas removed. Hemostasis was achieved. Mediastinal drainage tubes were placed. The sternum was closed with double #6 stainless steel wires. The fascia was closed with continuous # 1 vicryl suture. The subcutaneous tissue was closed with 2-0 vicryl continuous suture. The skin was closed with 3-0 vicryl subcuticular suture. All sponge, needle, and instrument counts were reported correct at the end of the case. Dry sterile dressings were placed over the incisions and around the chest tubes which were connected to pleurevac suction. The patient was then transported to the surgical intensive care unit in critical but stable condition.

## 2016-11-16 NOTE — Anesthesia Preprocedure Evaluation (Addendum)
Anesthesia Evaluation  Patient identified by MRN, date of birth, ID band Patient awake    Reviewed: Allergy & Precautions, H&P , NPO status , Patient's Chart, lab work & pertinent test results  Airway Mallampati: II  TM Distance: >3 FB Neck ROM: full    Dental  (+) Edentulous Upper, Edentulous Lower, Dental Advisory Given   Pulmonary former smoker,    breath sounds clear to auscultation       Cardiovascular hypertension, + CAD and +CHF  + Cardiac Defibrillator + Valvular Problems/Murmurs AS  Rhythm:regular Rate:Normal + Systolic murmurs Cath (63/8466):  1. No obstructive disease noted in the major epicardial vessels.  2. Severe aortic valve stenosis (mean gradient 51.4 mmHg, peak gradient 59 mm Hg, AVA 0.5cm2)   Neuro/Psych  Headaches,    GI/Hepatic   Endo/Other    Renal/GU      Musculoskeletal   Abdominal   Peds  Hematology   Anesthesia Other Findings   Reproductive/Obstetrics                            Anesthesia Physical Anesthesia Plan  ASA: III  Anesthesia Plan: General   Post-op Pain Management:    Induction: Intravenous  Airway Management Planned: Oral ETT  Additional Equipment: Arterial line, CVP, PA Cath, TEE and Ultrasound Guidance Line Placement  Intra-op Plan:   Post-operative Plan: Post-operative intubation/ventilation  Informed Consent: I have reviewed the patients History and Physical, chart, labs and discussed the procedure including the risks, benefits and alternatives for the proposed anesthesia with the patient or authorized representative who has indicated his/her understanding and acceptance.   Dental advisory given  Plan Discussed with: CRNA, Anesthesiologist and Surgeon  Anesthesia Plan Comments:        Anesthesia Quick Evaluation

## 2016-11-16 NOTE — H&P (Signed)
[name]       [place] [address]             [id number]      Cardiothoracic Surgery Admission History and Physical   PCP is Lujean Amel, MD Referring Provider is Burnell Blanks*      Chief Complaint  Patient presents with  . Aortic Stenosis        HPI:  The patient is a 61 year old woman with hypertension, hyperlipidemia and a remote history of non-ischemic cardiomyopathy dating back to 05/2002. She developed shortness of breath at that time and an echo showed an EF of 20-30% with mild AS, mild-moderate AI, and mild-moderate MR. She was treated medically with improvement and subsequently underwent implantation of a Bi-V ICD on 03/24/2005 for NICM with class III CHF and LBBB morphology. The etiology of her cardiomyopathy at that time was felt to be valvular heart disease and HTN. She says that she felt much better after that and a follow-up echo in 04/2008 showed an improvement in her EF to 65% with overall normal LV function, moderate AS with a mean gradient of 21 mm Hg and an AVA of 1.08 cm2 with mild to moderate AI, mild MR. She has a repeat echo in 02/2012 that showed an increased in the AV gradient to 42 mm Hg consistent with severe AS. The mean gradient was 38 mm Hg in 04/2014. She had an echo in 10/2015 that showed an increase in the mean gradient to 51 mm Hg with an AVA of 0.81 cm2 with moderate AI and normal LV function with moderate LVH. She was seen by Dr. Angelena Form at that time but was asymptomatic. A repeat echo in 05/2016 showed a mean gradient of 47 mm Hg with an AVA of 0.79 cm2 with mild AI and mild MR. She now says that she has developed exertional fatigue and tiredness. She denies any chest pain or pressure and has no shortness of breath. She describes some mental cloudiness and memory disturbance. Her niece and son said that they have noted some decline in her activity and functional capacity. She had not seen a dentist in years but  saw  her dentist and had a tooth extracted and was told that her teeth were in adequate condition for valve surgery.       Past Medical History:  Diagnosis Date  . Abnormal liver function tests   . Aortic stenosis   . Benign neoplasm of colon 04/19/2001   Hyperplastic  . Bicuspid aortic valve   . Cardiomyopathy secondary   . CHF (congestive heart failure) ([place])   . HTN (hypertension)   . Hypercalcemia   . Hyperlipidemia   . S/P ICD (internal cardiac defibrillator) procedure          Past Surgical History:  Procedure Laterality Date  . CARDIAC CATHETERIZATION N/A 07/05/2016   Procedure: Right/Left Heart Cath and Coronary Angiography;  Surgeon: Burnell Blanks, MD;  Location: [facility];  Service: Cardiovascular;  Laterality: N/A;  . implantation of a Guidant biventricular ICD    . laparoscopic cholecystectomy with intraoperative cholangiogram           Family History  Problem Relation Age of Onset  . Pancreatic cancer Mother   . Colon cancer Maternal Aunt   . Bone cancer Brother   . Heart attack Brother   . Hypercalcemia Neg Hx     Social History  Social History  Substance Use Topics  . Smoking status: Current Smoker    Packs/day: 1.00    Years: 40.00    Types: Cigarettes    Quit date:   . Smokeless tobacco: Never Used     Comment: none in last 15 days  . Alcohol use 0.0 oz/week      Comment: occasional   She minimizes her smoking but her family says that she still smokes a pack a day. Only quit briefly in the past.         Current Outpatient Prescriptions  Medication Sig Dispense Refill  . acetaminophen (TYLENOL) 325 MG tablet Take 325 mg by mouth every 6 (six) hours as needed for mild pain.    . carvedilol (COREG) 25 MG tablet Take 25 mg by mouth 2 (two) times daily with a meal.     . furosemide (LASIX) 40 MG tablet Take 1 tablet (40 mg) by mouth every other day. (Patient taking differently:  Take 40 mg by mouth every other day. Take 1 tablet (40 mg) by mouth every other day.) 15 tablet 11  . isosorbide-hydrALAZINE (BIDIL) 20-37.5 MG tablet Take 0.5 tablets by mouth 2 (two) times daily. TAKE 1/2 TABLET BY MOUTH TWO TIMES DAILY     . Multiple Vitamin (MULTIVITAMIN) capsule Take 1 capsule by mouth daily.      Marland Kitchen olmesartan (BENICAR) 20 MG tablet Take 20 mg by mouth daily.     . rosuvastatin (CRESTOR) 10 MG tablet Take 1 tablet (10 mg total) by mouth daily. 90 tablet 3  . spironolactone (ALDACTONE) 25 MG tablet Take 1 tablet (25 mg total) by mouth daily. 90 tablet 3   No current facility-administered medications for this visit.     No Known Allergies  Review of Systems  Constitutional: Positive for activity change, appetite change, fatigue and unexpected weight change.  HENT: Positive for dental problem.        Had a loose tooth and had not seen dentist in years but recently saw her dentist and had that tooth extracted. Eyes: Negative.   Respiratory: Negative for chest tightness and shortness of breath.   Cardiovascular: Negative for chest pain, palpitations and leg swelling.  Gastrointestinal: Negative.   Endocrine: Negative.   Genitourinary: Negative.   Musculoskeletal: Negative.   Allergic/Immunologic: Negative.   Neurological: Positive for dizziness.       Memory disturbance  Hematological: Negative.   Psychiatric/Behavioral: Positive for decreased concentration. The patient is nervous/anxious.     BP 92/62 (BP Location: Right Arm, Cuff Size: Small)   Pulse 79   Resp 20   Ht 5\' 4"  (1.626 m)   Wt 132 lb (59.9 kg)   SpO2 98% Comment: RA  BMI 22.66 kg/m  Physical Exam  Constitutional: She is oriented to person, place, and time. She appears well-developed and well-nourished. No distress.  HENT:  Head: Normocephalic and atraumatic.  Mouth/Throat: Oropharynx is clear and moist.  Multiple missing teeth.  Eyes: EOM are normal. Pupils are equal, round, and  reactive to light.  Neck: Normal range of motion. Neck supple. No JVD present. No thyromegaly present.  Cardiovascular: Normal rate, regular rhythm and intact distal pulses.   Murmur heard. 3/6 systolic murmur RSB  Pulmonary/Chest: Effort normal and breath sounds normal. No respiratory distress. She has no wheezes. She has no rales.  Abdominal: Soft. Bowel sounds are normal. She exhibits no distension and no mass. There is no tenderness.  Musculoskeletal: Normal range of motion. She exhibits no edema.  Lymphadenopathy:    She has no cervical adenopathy.  Neurological: She is alert and oriented to person, place, and time. She has normal strength. No cranial nerve deficit or sensory deficit.  Skin: Skin is warm and dry.  Psychiatric: She has a normal mood and affect.    Diagnostic Tests:  [facility] [address], [place] [address] [phone]  ------------------------------------------------------------------- Transthoracic Echocardiography  Patient: Roseana, Rhine MR #: [id number] Study Date: 06/13/2016 Gender: F Age: 73 Height: 162.6 cm Weight: 59.4 kg BSA: 1.64 m^2 Pt. Status: Room:  ATTENDING Kirk Ruths SONOGRAPHER Oletta Lamas, Will Willia Craze, Christopher REFERRING McAlhany, Christopher PERFORMING [facility], Outpatient  cc:  ------------------------------------------------------------------- LV EF: 55% - 60%  ------------------------------------------------------------------- History: PMH: LBBB. Bicuspid aortic valve. ICD. Acquired from the patient and from the patient&'s chart. Congestive heart failure. Aortic stenosis. Aortic valve disease. Risk factors: Former tobacco use. Hypertension. Dyslipidemia.  ------------------------------------------------------------------- Study  Conclusions  - Left ventricle: The cavity size was normal. Wall thickness was increased in a pattern of moderate LVH. Systolic function was normal. The estimated ejection fraction was in the range of 55% to 60%. Wall motion was normal; there were no regional wall motion abnormalities. Doppler parameters are consistent with abnormal left ventricular relaxation (grade 1 diastolic dysfunction). - Aortic valve: Valve mobility was restricted. There was severe stenosis. There was mild regurgitation. - Mitral valve: Calcified annulus. There was mild regurgitation. - Left atrium: The atrium was mildly dilated.  Impressions:  - Normal LV systolic function; moderate LVH; grade 1 diastolic dysfunction; severely calcified aortic valve with severe AS; mild AI; mild MR; mild LAE; trace TR.  ------------------------------------------------------------------- Study data: Comparison was made to the study of 11/02/2015. Study status: Routine. Procedure: The patient reported no pain pre or post test. Transthoracic echocardiography for left ventricular function evaluation and for assessment of valvular function. Image quality was adequate. Study completion: There were no complications. Transthoracic echocardiography. M-mode, complete 2D, spectral Doppler, and color Doppler. Birthdate: Patient birthdate: 01/11/1956. Age: Patient is 61 yr old. Sex: Gender: female. BMI: 22.5 kg/m^2. Blood pressure: 88/40 Patient status: Outpatient. Study date: Study date: 06/13/2016. Study time: 10:55 AM. Location: [facility]  -------------------------------------------------------------------  ------------------------------------------------------------------- Left ventricle: The cavity size was normal. Wall thickness was increased in a pattern of moderate LVH. Systolic function was normal. The estimated ejection fraction was in the range of 55%  to 60%. Wall motion was normal; there were no regional wall motion abnormalities. Doppler parameters are consistent with abnormal left ventricular relaxation (grade 1 diastolic dysfunction).  ------------------------------------------------------------------- Aortic valve: Trileaflet; severely calcified leaflets. Valve mobility was restricted. Doppler: There was severe stenosis. There was mild regurgitation. VTI ratio of LVOT to aortic valve: 0.23. Valve area (VTI): 0.74 cm^2. Indexed valve area (VTI): 0.45 cm^2/m^2. Peak velocity ratio of LVOT to aortic valve: 0.25. Valve area (Vmax): 0.79 cm^2. Indexed valve area (Vmax): 0.48 cm^2/m^2. Mean velocity ratio of LVOT to aortic valve: 0.23. Valve area (Vmean): 0.71 cm^2. Indexed valve area (Vmean): 0.43 cm^2/m^2. Mean gradient (S): 47 mm Hg. Peak gradient (S): 86 mm Hg.  ------------------------------------------------------------------- Aorta: Aortic root: The aortic root was normal in size.  ------------------------------------------------------------------- Mitral valve: Calcified annulus. Mobility was not restricted. Doppler: Transvalvular velocity was within the normal range. There was no evidence for stenosis. There was mild regurgitation. Peak gradient (D): 3 mm Hg.  ------------------------------------------------------------------- Left atrium: The atrium was mildly dilated.  ------------------------------------------------------------------- Right ventricle: The cavity size was normal. Pacer wire or catheter noted in right ventricle.  Systolic function was normal.  ------------------------------------------------------------------- Pulmonic valve: Doppler: Transvalvular velocity was within the normal range. There was no evidence for stenosis.  ------------------------------------------------------------------- Tricuspid valve: Structurally normal valve. Doppler: Transvalvular velocity was  within the normal range. There was trivial regurgitation.  ------------------------------------------------------------------- Pulmonary artery: Systolic pressure was within the normal range.  ------------------------------------------------------------------- Right atrium: The atrium was normal in size. Pacer wire or catheter noted in right atrium.  ------------------------------------------------------------------- Pericardium: There was no pericardial effusion.  ------------------------------------------------------------------- Systemic veins: Inferior vena cava: The vessel was normal in size.  ------------------------------------------------------------------- Measurements  Left ventricle Value Reference LV ID, ED, PLAX chordal (L) 40.2 mm 43 - 52 LV ID, ES, PLAX chordal 27.3 mm 23 - 38 LV fx shortening, PLAX chordal 32 % >=29 LV PW thickness, ED 13.18 mm --------- IVS/LV PW ratio, ED 1.02 <=1.3 Stroke volume, 2D 79 ml --------- Stroke volume/bsa, 2D 48 ml/m^2 ---------  Ventricular septum Value Reference IVS thickness, ED 13.43 mm ---------  LVOT Value Reference LVOT ID, S 20 mm --------- LVOT area 3.14 cm^2 --------- LVOT ID 20 mm --------- LVOT peak velocity, S 116 cm/s --------- LVOT mean velocity, S 72.1 cm/s --------- LVOT VTI, S 25.3 cm --------- LVOT peak gradient, S 5 mm Hg  --------- Stroke volume (SV), LVOT DP 79.5 ml --------- Stroke index (SV/bsa), LVOT DP 48.4 ml/m^2 ---------  Aortic valve Value Reference Aortic valve peak velocity, S 464 cm/s --------- Aortic valve mean velocity, S 318 cm/s --------- Aortic valve VTI, S 108 cm --------- Aortic mean gradient, S 47 mm Hg --------- Aortic peak gradient, S 86 mm Hg --------- VTI ratio, LVOT/AV 0.23 --------- Aortic valve area, VTI 0.74 cm^2 --------- Aortic valve area/bsa, VTI 0.45 cm^2/m^2 --------- Velocity ratio, peak, LVOT/AV 0.25 --------- Aortic valve area, peak velocity 0.79 cm^2 --------- Aortic valve area/bsa, peak 0.48 cm^2/m^2 --------- velocity Velocity ratio, mean, LVOT/AV 0.23 --------- Aortic valve area, mean velocity 0.71 cm^2 --------- Aortic valve area/bsa, mean 0.43 cm^2/m^2 --------- velocity Aortic regurg pressure half-time 349 ms ---------  Aorta Value Reference Aortic root ID, ED 30 mm --------- Ascending aorta ID, A-P, S 35 mm ---------  Left atrium Value Reference LA ID, A-P, ES 37 mm --------- LA ID/bsa, A-P (H) 2.25 cm/m^2 <=2.2 LA volume, S 56 ml --------- LA volume/bsa, S 34.1 ml/m^2 --------- LA volume, ES, 1-p A4C 52 ml --------- LA volume/bsa, ES, 1-p A4C  31.7 ml/m^2 --------- LA volume, ES, 1-p A2C 60 ml --------- LA volume/bsa, ES, 1-p A2C 36.5 ml/m^2 ---------  Mitral valve Value Reference Mitral E-wave peak velocity 80.5 cm/s --------- Mitral A-wave peak velocity 98.7 cm/s --------- Mitral deceleration time (H) 271 ms 150 - 230 Mitral peak gradient, D 3 mm Hg --------- Mitral E/A ratio, peak 0.8 ---------  Pulmonary arteries Value Reference PA pressure, S, DP 20 mm Hg <=30  Tricuspid valve Value Reference Tricuspid regurg peak velocity 205 cm/s --------- Tricuspid peak RV-RA gradient 17 mm Hg ---------  Systemic veins Value Reference Estimated CVP 3 mm Hg ---------  Right ventricle Value Reference RV pressure, S, DP 20 mm Hg <=30  Legend: (L) and (H) mark values outside specified reference range.  ------------------------------------------------------------------- Prepared and Electronically Authenticated by  Kirk Ruths 2017-09-19T14:22:54   Burnell Blanks, MD (Primary)    Procedures   Right/Left Heart Cath and Coronary Angiography  Conclusion     Prox RCA lesion, 20 %stenosed.  Mid RCA lesion, 50 %stenosed.  Dist RCA lesion, 90 %stenosed.  Ost 2nd Mrg to 2nd Mrg lesion, 20 %stenosed.  Ost LAD to Mid LAD lesion, 20 %stenosed.  Ost 2nd Diag lesion, 20 %stenosed.  There is severe aortic valve stenosis.  1. No obstructive disease noted in the major epicardial vessels.  2. Severe aortic  valve stenosis (mean gradient 51.4 mmHg, peak gradient 59 mm Hg, AVA 0.5cm2)  Recommendations: Medical management of CAD. Surgical referral for evaluation for surgical aortic valve replacement.    Indications   Severe aortic stenosis [I35.0 (ICD-10-CM)]  Procedural Details/Technique   Technical Details Indication: 61 yo female with severe aortic stenosis, NICM, HTN, HLD.   Procedure: The risks, benefits, complications, treatment options, and expected outcomes were discussed with the patient. The patient and/or family concurred with the proposed plan, giving informed consent. The patient was brought to the cath lab after IV hydration was begun and oral premedication was given. The patient was further sedated with Versed and Fentanyl. There was an IV catheter present in the right antecubital vein. This area was prepped and draped. I changed out the IV catheter for a 5 French sheath. Balloon tipped catheter used for right heart catheterization. The right wrist was prepped and draped in a sterile fashion. 1% lidocaine was used for local anesthesia. Using the modified Seldinger access technique, a 5 French sheath was placed in the right radial artery. 3 mg Verapamil was given through the sheath. 3000 units IV heparin was given. I could not engage the coronaries due to severe tortuosity of the right subclavian artery. I then prepped the right groin and placed a 5 French sheath in the right femoral artery. Standard diagnostic catheters were used to perform selective coronary angiography. I crossed the aortic valve with an AL-1 catheter and a straight wire. No LV gram performed. The sheath was removed from the right radial artery and a Terumo hemostasis band was applied at the arteriotomy site on the right wrist.     Estimated blood loss <50 mL.  During this procedure the patient was administered the following to achieve and maintain moderate conscious sedation: Versed 3 mg, Fentanyl 50 mcg, while the  patient's heart rate, blood pressure, and oxygen saturation were continuously monitored. The period of conscious sedation was 60 minutes, of which I was present face-to-face 100% of this time.    Complications   Complications documented before study signed (07/05/2016 10:27 AM EDT)    RIGHT/LEFT HEART CATH AND CORONARY ANGIOGRAPHY   None Documented by Burnell Blanks, MD 07/05/2016 9:51 AM EDT  Time Range: Intra-procedure      Coronary Findings   Dominance: Right  Left Anterior Descending  Ost LAD to Mid LAD lesion, 20% stenosed.  First Diagonal Branch  Vessel is small in size.  Second Diagonal Branch  Vessel is moderate in size.  Ost 2nd Diag lesion, 20% stenosed.  Third Diagonal Branch  Vessel is small in size.  Left Circumflex  Vessel is large.  First Obtuse Marginal Branch  Vessel is small in size.  Second Obtuse Marginal Branch  Vessel is large in size.  Ost 2nd Mrg to 2nd Mrg lesion, 20% stenosed.  Third Obtuse Marginal Branch  Vessel is moderate in size.  Right Coronary Artery  Prox RCA lesion, 20% stenosed.  Mid RCA lesion, 50% stenosed.  Dist RCA lesion, 90% stenosed.  Left Heart   Aortic Valve There is severe aortic valve stenosis. The aortic valve is calcified.    Coronary Diagrams   Diagnostic Diagram     Implants     No implant documentation for this case.  PACS Images   Show images for Cardiac catheterization   Link to Procedure Log   Procedure Log    Hemo Data   Flowsheet Row Most Recent Value  Fick Cardiac Output 4.08 L/min  Fick Cardiac Output Index 2.49 (L/min)/BSA  Aortic Mean Gradient 51.4 mmHg  Aortic Peak Gradient 59 mmHg  Aortic Valve Area 0.50  Aortic Value Area Index 0.3 cm2/BSA  RA A Wave 3 mmHg  RA V Wave 2 mmHg  RA Mean 0 mmHg  RV Systolic Pressure 23 mmHg  RV Diastolic Pressure 0 mmHg  RV EDP 3 mmHg  PA Systolic Pressure 23 mmHg  PA Diastolic Pressure 2 mmHg  PA Mean 12 mmHg  PW A Wave 4 mmHg   PW V Wave 5 mmHg  PW Mean 3 mmHg  AO Systolic Pressure 009 mmHg  AO Diastolic Pressure 62 mmHg  AO Mean 85 mmHg  LV Systolic Pressure 381 mmHg  LV Diastolic Pressure 8 mmHg  LV EDP 16 mmHg  Arterial Occlusion Pressure Extended Systolic Pressure 95 mmHg  Arterial Occlusion Pressure Extended Diastolic Pressure 51 mmHg  Arterial Occlusion Pressure Extended Mean Pressure 69 mmHg  Left Ventricular Apex Extended Systolic Pressure 829 mmHg  Left Ventricular Apex Extended Diastolic Pressure 7 mmHg  Left Ventricular Apex Extended EDP Pressure 14 mmHg  QP/QS 1  TPVR Index 4.81 HRUI  TSVR Index 34.13 HRUI  TPVR/TSVR Ratio 0.14     Impression:  This 61 year old woman has stage D severe symptomatic aortic stenosis with development of exertional fatigue and tiredness. Her echo reports document severe aortic stenosis back in 2013 but she was asymptomatic at that time. I have personally reviewed and interpreted her recent echo and cath. She has a trileaflet aortic valve that is severely calcified with restricted leaflet motion with a mean gradient of 47 mm Hg. Her LV function is normal and has been since 2009 with moderate concentric LVH despite having NICM and an EF of 20-30% back in 2003. The etiology of this is unclear. Her cath shows mild nonobstructive CAD with a 90% stenosis in the very distal RCA before a small PL branch and a 50% mid RCA lesion. I think this can be managed medically and does not require coronary bypass grafting. I think AVR is indicated for relief of symptoms and improvement in long term prognosis. I discussed the options for prosthesis including mechanical and tissue valves. She had been on coumadin for a long time since she was diagnosed with NICM and it was recently stopped prior to her cath with no plans to restart it since there is no indication for it. I think a tissue or mechanical valve would be fine at her age. She has never had any problems being on coumadin. She does  describe some memory problems recently and it is not clear if this is related to her AS and feeling poorly in general or some early dementia. It is probably better to err on the side of caution and use a tissue valve. She does not feel strongly about either choice.   I discussed the operative procedure with the patient and family including alternatives, benefits and risks; including but not limited to bleeding, blood transfusion, infection, stroke, myocardial infarction, heart block and pacer dependence, organ dysfunction, and death.  [name] understands and agrees to proceed.    Plan:  Aortic valve replacement using a bioprosthetic valve.  Gaye Pollack, MD [facility] [phone]

## 2016-11-17 ENCOUNTER — Encounter (HOSPITAL_COMMUNITY): Payer: Self-pay | Admitting: Surgery

## 2016-11-17 ENCOUNTER — Inpatient Hospital Stay (HOSPITAL_COMMUNITY): Payer: Medicare HMO

## 2016-11-17 LAB — POCT I-STAT 3, ART BLOOD GAS (G3+)
Acid-base deficit: 5 mmol/L — ABNORMAL HIGH (ref 0.0–2.0)
Bicarbonate: 20.3 mmol/L (ref 20.0–28.0)
O2 Saturation: 95 %
Patient temperature: 36.3
TCO2: 21 mmol/L (ref 0–100)
pCO2 arterial: 36.6 mmHg (ref 32.0–48.0)
pH, Arterial: 7.348 — ABNORMAL LOW (ref 7.350–7.450)
pO2, Arterial: 76 mmHg — ABNORMAL LOW (ref 83.0–108.0)

## 2016-11-17 LAB — POCT I-STAT, CHEM 8
BUN: 10 mg/dL (ref 6–20)
Calcium, Ion: 1.32 mmol/L (ref 1.15–1.40)
Chloride: 98 mmol/L — ABNORMAL LOW (ref 101–111)
Creatinine, Ser: 0.9 mg/dL (ref 0.44–1.00)
Glucose, Bld: 113 mg/dL — ABNORMAL HIGH (ref 65–99)
HCT: 36 % (ref 36.0–46.0)
Hemoglobin: 12.2 g/dL (ref 12.0–15.0)
Potassium: 3.8 mmol/L (ref 3.5–5.1)
Sodium: 134 mmol/L — ABNORMAL LOW (ref 135–145)
TCO2: 26 mmol/L (ref 0–100)

## 2016-11-17 LAB — BASIC METABOLIC PANEL
Anion gap: 4 — ABNORMAL LOW (ref 5–15)
BUN: 9 mg/dL (ref 6–20)
CO2: 21 mmol/L — ABNORMAL LOW (ref 22–32)
Calcium: 8.9 mg/dL (ref 8.9–10.3)
Chloride: 109 mmol/L (ref 101–111)
Creatinine, Ser: 0.8 mg/dL (ref 0.44–1.00)
GFR calc Af Amer: >60 mL/min (ref >60)
GFR calc non Af Amer: >60 mL/min (ref >60)
Glucose, Bld: 119 mg/dL — ABNORMAL HIGH (ref 65–99)
Potassium: 4.4 mmol/L (ref 3.5–5.1)
Sodium: 134 mmol/L — ABNORMAL LOW (ref 135–145)

## 2016-11-17 LAB — CBC
HCT: 32.8 % — ABNORMAL LOW (ref 36.0–46.0)
HCT: 33.4 % — ABNORMAL LOW (ref 36.0–46.0)
Hemoglobin: 10.7 g/dL — ABNORMAL LOW (ref 12.0–15.0)
Hemoglobin: 11.4 g/dL — ABNORMAL LOW (ref 12.0–15.0)
MCH: 28.6 pg (ref 26.0–34.0)
MCH: 29.8 pg (ref 26.0–34.0)
MCHC: 32.6 g/dL (ref 30.0–36.0)
MCHC: 34.1 g/dL (ref 30.0–36.0)
MCV: 87.7 fL (ref 78.0–100.0)
MCV: 87.2 fL (ref 78.0–100.0)
Platelets: 190 10*3/uL (ref 150–400)
Platelets: 169 10*3/uL (ref 150–400)
RBC: 3.74 MIL/uL — ABNORMAL LOW (ref 3.87–5.11)
RBC: 3.83 MIL/uL — ABNORMAL LOW (ref 3.87–5.11)
RDW: 15.4 % (ref 11.5–15.5)
RDW: 15.2 % (ref 11.5–15.5)
WBC: 6.2 10*3/uL (ref 4.0–10.5)
WBC: 9 10*3/uL (ref 4.0–10.5)

## 2016-11-17 LAB — PREPARE PLATELET PHERESIS
Unit division: 0
Unit division: 0

## 2016-11-17 LAB — GLUCOSE, CAPILLARY
Glucose-Capillary: 108 mg/dL — ABNORMAL HIGH (ref 65–99)
Glucose-Capillary: 114 mg/dL — ABNORMAL HIGH (ref 65–99)

## 2016-11-17 LAB — CREATININE, SERUM
Creatinine, Ser: 0.92 mg/dL (ref 0.44–1.00)
GFR calc Af Amer: >60 mL/min (ref >60)
GFR calc non Af Amer: >60 mL/min (ref >60)

## 2016-11-17 LAB — TYPE AND SCREEN
ABO/RH(D): A POS
Antibody Screen: NEGATIVE

## 2016-11-17 LAB — MAGNESIUM
Magnesium: 2.3 mg/dL (ref 1.7–2.4)
Magnesium: 1.9 mg/dL (ref 1.7–2.4)

## 2016-11-17 MED ORDER — SPIRONOLACTONE 25 MG PO TABS
25.0000 mg | ORAL_TABLET | Freq: Every day | ORAL | Status: DC
  Administered 2016-11-17 – 2016-11-20 (×4): 25 mg via ORAL
  Filled 2016-11-17 (×4): qty 1

## 2016-11-17 MED ORDER — SODIUM CHLORIDE 0.9 % IV SOLN
30.0000 meq | Freq: Once | INTRAVENOUS | Status: AC
  Administered 2016-11-17: 30 meq via INTRAVENOUS
  Filled 2016-11-17: qty 15

## 2016-11-17 MED ORDER — CARVEDILOL 25 MG PO TABS
25.0000 mg | ORAL_TABLET | Freq: Two times a day (BID) | ORAL | Status: DC
  Administered 2016-11-17 – 2016-11-19 (×5): 25 mg via ORAL
  Filled 2016-11-17 (×5): qty 1

## 2016-11-17 MED ORDER — FUROSEMIDE 10 MG/ML IJ SOLN
40.0000 mg | Freq: Two times a day (BID) | INTRAMUSCULAR | Status: AC
  Administered 2016-11-17 (×2): 40 mg via INTRAVENOUS
  Filled 2016-11-17 (×2): qty 4

## 2016-11-17 MED ORDER — ENOXAPARIN SODIUM 40 MG/0.4ML ~~LOC~~ SOLN
40.0000 mg | Freq: Every day | SUBCUTANEOUS | Status: DC
  Administered 2016-11-17 – 2016-11-21 (×5): 40 mg via SUBCUTANEOUS
  Filled 2016-11-17 (×5): qty 0.4

## 2016-11-17 MED FILL — Sodium Chloride IV Soln 0.9%: INTRAVENOUS | Qty: 2000 | Status: AC

## 2016-11-17 MED FILL — Electrolyte-R (PH 7.4) Solution: INTRAVENOUS | Qty: 5000 | Status: AC

## 2016-11-17 MED FILL — Lidocaine HCl IV Inj 20 MG/ML: INTRAVENOUS | Qty: 5 | Status: AC

## 2016-11-17 MED FILL — Heparin Sodium (Porcine) Inj 1000 Unit/ML: INTRAMUSCULAR | Qty: 2500 | Status: AC

## 2016-11-17 MED FILL — Sodium Chloride IV Soln 0.9%: INTRAVENOUS | Qty: 100 | Status: AC

## 2016-11-17 MED FILL — Dexmedetomidine HCl IV Soln 200 MCG/2ML: INTRAVENOUS | Qty: 4 | Status: AC

## 2016-11-17 MED FILL — Heparin Sodium (Porcine) Inj 1000 Unit/ML: INTRAMUSCULAR | Qty: 10 | Status: AC

## 2016-11-17 MED FILL — Sodium Bicarbonate IV Soln 8.4%: INTRAVENOUS | Qty: 50 | Status: AC

## 2016-11-17 MED FILL — Mannitol IV Soln 20%: INTRAVENOUS | Qty: 500 | Status: AC

## 2016-11-17 NOTE — Progress Notes (Signed)
1 Day Post-Op Procedure(s) (LRB): AORTIC VALVE REPLACEMENT (AVR) (N/A) TRANSESOPHAGEAL ECHOCARDIOGRAM (TEE) (N/A) Subjective:  Sore throat. Otherwise feels as expected after surgery  Objective: Vital signs in last 24 hours: Temp:  [94.1 F (34.5 C)-99.7 F (37.6 C)] 99.5 F (37.5 C) (02/23 0700) Pulse Rate:  [79-103] 82 (02/23 0700) Cardiac Rhythm: Atrial paced (02/22 2000) Resp:  [12-30] 21 (02/23 0700) BP: (91-138)/(69-93) 119/90 (02/23 0700) SpO2:  [97 %-100 %] 98 % (02/23 0700) Arterial Line BP: (78-144)/(34-84) 126/67 (02/23 0700) FiO2 (%):  [40 %-50 %] 40 % (02/22 2000) Weight:  [60.5 kg (133 lb 6.1 oz)-66.5 kg (146 lb 9.7 oz)] 66.5 kg (146 lb 9.7 oz) (02/23 0500)  Hemodynamic parameters for last 24 hours: PAP: (19-46)/(7-24) 32/14 CO:  [2.6 L/min-3.8 L/min] 3.8 L/min CI:  [1.6 L/min/m2-2.3 L/min/m2] 2.3 L/min/m2  Intake/Output from previous day: 02/22 0701 - 02/23 0700 In: 6461.8 [I.V.:4347.8; Blood:849; IV ZOXWRUEAV:4098] Out: 42 [Urine:2650; Emesis/NG output:100; Blood:1100; Chest Tube:410] Intake/Output this shift: No intake/output data recorded.  General appearance: alert and cooperative Neurologic: intact Heart: regular rate and rhythm, S1, S2 normal, no murmur, click, rub or gallop Lungs: clear to auscultation bilaterally Extremities: edema moderate edema Wound: dressing dry  Lab Results:  Recent Labs  11/16/16 1840 11/16/16 1843 11/17/16 0355  WBC 5.6  --  6.2  HGB 11.0* 11.2* 10.7*  HCT 33.4* 33.0* 32.8*  PLT 177  --  190   BMET:  Recent Labs  11/14/16 1140  11/16/16 1843 11/17/16 0355  NA 138  < > 141 134*  K 4.2  < > 4.7 4.4  CL 108  < > 110 109  CO2 20*  --   --  21*  GLUCOSE 100*  < > 107* 119*  BUN 14  < > 11 9  CREATININE 0.83  < > 0.80 0.80  CALCIUM 11.0*  --   --  8.9  < > = values in this interval not displayed.  PT/INR:  Recent Labs  11/16/16 1300  LABPROT 17.4*  INR 1.41   ABG    Component Value Date/Time   PHART 7.339 (L) 11/16/2016 1955   HCO3 20.4 11/16/2016 1955   TCO2 22 11/16/2016 1955   ACIDBASEDEF 5.0 (H) 11/16/2016 1955   O2SAT 98.0 11/16/2016 1955   CBG (last 3)   Recent Labs  11/16/16 1957 11/17/16 0012 11/17/16 0402  GLUCAP 109* 114* 108*   CLINICAL DATA:  Chest tube. Chest soreness. Status post aortic valve replacement  EXAM: PORTABLE CHEST 1 VIEW  COMPARISON:  Yesterday  FINDINGS: Swan-Ganz catheter from the right with tip directed towards the right main pulmonary artery. Endotracheal and orogastric tubes been removed. Lower volumes with increased atelectasis. Probable small pleural effusions. No pulmonary edema seen in the apical lung zones. Biventricular ICD/ pacer from the left. Thoracic drain without visible pneumothorax. Stable postoperative heart size.  IMPRESSION: Increased atelectasis after extubation.   Electronically Signed   By: Monte Fantasia M.D.   On: 11/17/2016 07:13  ECG: A-sensed and V-paced internally  Assessment/Plan: S/P Procedure(s) (LRB): AORTIC VALVE REPLACEMENT (AVR) (N/A) TRANSESOPHAGEAL ECHOCARDIOGRAM (TEE) (N/A)  She is hemodynamically stable in atrial sensed and ventricular paced rhythm. She has BiV ICD due to remote non-ischemic cardiomyopathy with low EF and class III CHF with LBBB morphology. LV systolic function has subsequently returned to normal. She has severe LVH with diastolic dysfunction and chronic diastolic heart failure. Will resume Coreg, Bidil and Benicar, Aldactone as BP allows.  Mobilize Diuresis d/c tubes/lines Continue  foley due to diuresing patient and patient in ICU See progression orders   LOS: 1 day    Gaye Pollack 11/17/2016

## 2016-11-17 NOTE — Progress Notes (Signed)
Patient ID: [name], female   DOB: Dec 17, 1955, 61 y.o.   MRN: [id number] EVENING ROUNDS NOTE :     [address]       [place] [address]             [phone]                 1 Day Post-Op Procedure(s) (LRB): AORTIC VALVE REPLACEMENT (AVR) (N/A) TRANSESOPHAGEAL ECHOCARDIOGRAM (TEE) (N/A)  Total Length of Stay:  LOS: 1 day  BP 114/85   Pulse 75   Temp 99.1 F (37.3 C) (Oral)   Resp 16   Ht 5\' 4"  (1.626 m)   Wt 146 lb 9.7 oz (66.5 kg)   SpO2 98%   BMI 25.16 kg/m   .Intake/Output      02/22 0701 - 02/23 0700 02/23 0701 - 02/24 0700   P.O.  480   I.V. (mL/kg) 4347.8 (65.4) 360 (5.4)   Blood 849    IV Piggyback 1265 315   Total Intake(mL/kg) 6461.8 (97.2) 1155 (17.4)   Urine (mL/kg/hr) 2650 (1.7) 2305 (3)   Emesis/NG output 100 (0.1)    Blood 1100 (0.7)    Chest Tube 410 (0.3)    Total Output 4260 2305   Net +2201.8 -1150          . sodium chloride Stopped (11/17/16 0902)  . sodium chloride    . sodium chloride Stopped (11/17/16 0902)  . lactated ringers Stopped (11/16/16 2200)  . lactated ringers 20 mL/hr at 11/17/16 1800  . nitroGLYCERIN Stopped (11/17/16 0658)     Lab Results  Component Value Date   WBC 9.0 11/17/2016   HGB 12.2 11/17/2016   HCT 36.0 11/17/2016   PLT 169 11/17/2016   GLUCOSE 113 (H) 11/17/2016   ALT 35 11/14/2016   AST 31 11/14/2016   NA 134 (L) 11/17/2016   K 3.8 11/17/2016   CL 98 (L) 11/17/2016   CREATININE 0.90 11/17/2016   BUN 10 11/17/2016   CO2 21 (L) 11/17/2016   TSH 1.02 02/17/2016   INR 1.41 11/16/2016   HGBA1C 6.1 (H) 11/14/2016   Stable day, very slow mobilizing Pt consult ordered  [name] MD  Beeper [phone] Office [phone] 11/17/2016 6:33 PM

## 2016-11-17 NOTE — Care Management Note (Signed)
Case Management Note  Patient Details  Name: ADRIEN DIETZMAN MRN: [id number] Date of Birth: 04/03/1956  Subjective/Objective:     S/p AVR               Action/Plan:   Pt alert and oriented with nieces at bedside during assessment.  PTA pt was home alone independent.  Pt and family voiced concerns regarding pt returning home without 24/7 supervision post discharge - family are in the process of trying to arrange coverage but has not been able to firm up a plan.  CM informed both pt and family that PT will be ordered when medically stable (pt is currently on pressor) for evaluation to help determine appropriate discharge plan.  CM requested PT order via bedside nurse and physician sticky tab.  CM will continue to follow for discharge needs   Expected Discharge Date:                  Expected Discharge Plan:     In-House Referral:  Clinical Social Work  Discharge planning Services  CM Consult  Post Acute Care Choice:    Choice offered to:     DME Arranged:    DME Agency:     HH Arranged:    [place] Agency:     Status of Service:  In process, will continue to follow  If discussed at Long Length of Stay Meetings, dates discussed:    Additional Comments:  Maryclare Labrador, RN 11/17/2016, 10:05 AM

## 2016-11-18 ENCOUNTER — Inpatient Hospital Stay (HOSPITAL_COMMUNITY): Payer: Medicare HMO

## 2016-11-18 LAB — POCT I-STAT 4, (NA,K, GLUC, HGB,HCT)
Glucose, Bld: 101 mg/dL — ABNORMAL HIGH (ref 65–99)
HCT: 35 % — ABNORMAL LOW (ref 36.0–46.0)
Hemoglobin: 11.9 g/dL — ABNORMAL LOW (ref 12.0–15.0)
Potassium: 3.4 mmol/L — ABNORMAL LOW (ref 3.5–5.1)
Sodium: 137 mmol/L (ref 135–145)

## 2016-11-18 LAB — CBC
HCT: 33.6 % — ABNORMAL LOW (ref 36.0–46.0)
Hemoglobin: 11.2 g/dL — ABNORMAL LOW (ref 12.0–15.0)
MCH: 28.9 pg (ref 26.0–34.0)
MCHC: 33.3 g/dL (ref 30.0–36.0)
MCV: 86.8 fL (ref 78.0–100.0)
Platelets: 163 10*3/uL (ref 150–400)
RBC: 3.87 MIL/uL (ref 3.87–5.11)
RDW: 15.2 % (ref 11.5–15.5)
WBC: 8.4 10*3/uL (ref 4.0–10.5)

## 2016-11-18 LAB — BASIC METABOLIC PANEL
Anion gap: 7 (ref 5–15)
BUN: 14 mg/dL (ref 6–20)
CO2: 26 mmol/L (ref 22–32)
Calcium: 9.6 mg/dL (ref 8.9–10.3)
Chloride: 102 mmol/L (ref 101–111)
Creatinine, Ser: 0.98 mg/dL (ref 0.44–1.00)
GFR calc Af Amer: >60 mL/min (ref >60)
GFR calc non Af Amer: >60 mL/min (ref >60)
Glucose, Bld: 113 mg/dL — ABNORMAL HIGH (ref 65–99)
Potassium: 3.8 mmol/L (ref 3.5–5.1)
Sodium: 135 mmol/L (ref 135–145)

## 2016-11-18 MED ORDER — POTASSIUM CHLORIDE CRYS ER 20 MEQ PO TBCR
20.0000 meq | EXTENDED_RELEASE_TABLET | Freq: Once | ORAL | Status: AC
  Administered 2016-11-18: 20 meq via ORAL
  Filled 2016-11-18: qty 1

## 2016-11-18 NOTE — Evaluation (Signed)
Physical Therapy Evaluation Patient Details Name: [name] MRN: [id number] DOB: 1955-12-13 Today's Date: 11/18/2016   History of Present Illness  Patient is a 61 yo female admitted 11/16/16 with AS now s/p AVR on 11/16/16.    PMH:  NICM, pacemaker, CHF, LBBB  Clinical Impression  Patient presents with problems listed below.  Will benefit from acute PT to maximize functional mobility prior to discharge.  Patient requiring assist for mobility and gait, and does not have 24/7 assist.  Recommend ST-SNF for continued therapy at d/c with goal to return home at Mod I level.    Follow Up Recommendations SNF;Supervision/Assistance - 24 hour    Equipment Recommendations  Rolling walker with 5" wheels;3in1 (PT)    Recommendations for Other Services       Precautions / Restrictions Precautions Precautions: Sternal Precaution Comments: Reviewed sternal precautions with patient. Restrictions Weight Bearing Restrictions: Yes Other Position/Activity Restrictions: Sternal precautions      Mobility  Bed Mobility Overal bed mobility: Needs Assistance Bed Mobility: Rolling;Sidelying to Sit;Sit to Sidelying Rolling: Min guard Sidelying to sit: Min assist     Sit to sidelying: Mod assist General bed mobility comments: Verbal cues for technique to maintain sternal precautions.  Assist to bring trunk upright.  Once upright, patient with good sitting balance.  Assist to bring LE's onto bed to return to supine.  Transfers Overall transfer level: Needs assistance Equipment used: None Transfers: Sit to/from Stand Sit to Stand: Min assist         General transfer comment: Verbal cues for technique.  Patient holding pillow at chest.  Assist to translate trunk forward over feet and rise to standing.  Patient able to use LE's to power up to stance.  Ambulation/Gait Ambulation/Gait assistance: Min guard Ambulation Distance (Feet): 200 Feet Assistive device:  (Eva walker) Gait  Pattern/deviations: Step-through pattern;Decreased stride length Gait velocity: decreased Gait velocity interpretation: Below normal speed for age/gender General Gait Details: Patient with slow steady gait with Harmon Pier walker.  Upright posture encouraged.  Stairs            Wheelchair Mobility    Modified Rankin (Stroke Patients Only)       Balance Overall balance assessment: Needs assistance Sitting-balance support: No upper extremity supported;Feet supported Sitting balance-Leahy Scale: Good     Standing balance support: No upper extremity supported Standing balance-Leahy Scale: Fair                               Pertinent Vitals/Pain Pain Assessment: No/denies pain    Home Living Family/patient expects to be discharged to:: Skilled nursing facility Living Arrangements: Alone Available Help at Discharge: Family;Available PRN/intermittently (Family only available in evenings (not days or nights)) Type of Home: Apartment Home Access: Stairs to enter Entrance Stairs-Rails: Psychiatric nurse of Steps: 8   Home Equipment: None      Prior Function Level of Independence: Independent               Hand Dominance        Extremity/Trunk Assessment   Upper Extremity Assessment Upper Extremity Assessment: Generalized weakness (Difficult to test due to sternal precautions)    Lower Extremity Assessment Lower Extremity Assessment: Generalized weakness       Communication   Communication: No difficulties  Cognition Arousal/Alertness: Awake/alert Behavior During Therapy: WFL for tasks assessed/performed Overall Cognitive Status: Within Functional Limits for tasks assessed  General Comments      Exercises     Assessment/Plan    PT Assessment Patient needs continued PT services  PT Problem List Decreased strength;Decreased balance;Decreased mobility;Decreased knowledge of use of DME;Decreased  safety awareness;Decreased knowledge of precautions;Cardiopulmonary status limiting activity;Decreased skin integrity       PT Treatment Interventions DME instruction;Gait training;Functional mobility training;Therapeutic activities;Patient/family education    PT Goals (Current goals can be found in the Care Plan section)  Acute Rehab PT Goals Patient Stated Goal: To be able to return home to apartment PT Goal Formulation: With patient Time For Goal Achievement: 11/25/16 Potential to Achieve Goals: Good    Frequency Min 3X/week   Barriers to discharge Inaccessible home environment;Decreased caregiver support Patient's apartment has 8 steps to enter.  Patient lives alone.  Family works and can only help in evenings - not days or nights.    Co-evaluation               End of Session   Activity Tolerance: Patient tolerated treatment well;Patient limited by fatigue Patient left: in bed;with call bell/phone within reach Nurse Communication: Mobility status PT Visit Diagnosis: Other abnormalities of gait and mobility (R26.89)         Time: 5051-8335 PT Time Calculation (min) (ACUTE ONLY): 23 min   Charges:   PT Evaluation $PT Eval High Complexity: 1 Procedure PT Treatments $Gait Training: 8-22 mins   PT G Codes:         Despina Pole 01-Dec-2016, 9:04 PM Carita Pian. Sanjuana Kava, [place] Pager [phone]

## 2016-11-18 NOTE — Progress Notes (Signed)
Paged Servando Snare and made aware of patients rhythm change

## 2016-11-18 NOTE — Progress Notes (Signed)
Patient ID: [name], female   DOB: Jun 10, 1956, 61 y.o.   MRN: [id number] EVENING ROUNDS NOTE :     [address]       [place] [address]             [phone]                 2 Days Post-Op Procedure(s) (LRB): AORTIC VALVE REPLACEMENT (AVR) (N/A) TRANSESOPHAGEAL ECHOCARDIOGRAM (TEE) (N/A)  Total Length of Stay:  LOS: 2 days  BP (!) 152/92   Pulse 75   Temp 99.7 F (37.6 C) (Oral)   Resp (!) 22   Ht 5\' 4"  (1.626 m)   Wt 137 lb 11.2 oz (62.5 kg)   SpO2 99%   BMI 23.64 kg/m   .Intake/Output      02/24 0701 - 02/25 0700   P.O. 360   I.V. (mL/kg) 260 (4.2)   Other    IV Piggyback    Total Intake(mL/kg) 620 (9.9)   Urine (mL/kg/hr) 885 (1.1)   Total Output 885   Net -265         . sodium chloride Stopped (11/17/16 0902)  . sodium chloride    . sodium chloride Stopped (11/17/16 0902)  . lactated ringers Stopped (11/16/16 2200)  . lactated ringers 20 mL/hr at 11/18/16 1200  . nitroGLYCERIN Stopped (11/18/16 1832)     Lab Results  Component Value Date   WBC 8.4 11/18/2016   HGB 11.9 (L) 11/18/2016   HCT 35.0 (L) 11/18/2016   PLT 163 11/18/2016   GLUCOSE 101 (H) 11/18/2016   ALT 35 11/14/2016   AST 31 11/14/2016   NA 137 11/18/2016   K 3.4 (L) 11/18/2016   CL 102 11/18/2016   CREATININE 0.98 11/18/2016   BUN 14 11/18/2016   CO2 26 11/18/2016   TSH 1.02 02/17/2016   INR 1.41 11/16/2016   HGBA1C 6.1 (H) 11/14/2016   Paced rhythm, frequent pac's   [name] MD  Beeper [phone] Office [phone] 11/18/2016 7:27 PM

## 2016-11-18 NOTE — Progress Notes (Signed)
Patient ID: [name], female   DOB: Feb 02, 1956, 61 y.o.   MRN: [id number] TCTS DAILY ICU PROGRESS NOTE                   [address]            [place], [address]          [phone]   2 Days Post-Op Procedure(s) (LRB): AORTIC VALVE REPLACEMENT (AVR) (N/A) TRANSESOPHAGEAL ECHOCARDIOGRAM (TEE) (N/A)  Total Length of Stay:  LOS: 2 days   Subjective: Alert, awake , slow to mobilize   Objective: Vital signs in last 24 hours: Temp:  [97.5 F (36.4 C)-99.7 F (37.6 C)] 99.7 F (37.6 C) (02/24 1229) Pulse Rate:  [60-77] 75 (02/24 1200) Cardiac Rhythm: Ventricular paced;Heart block (02/24 1200) Resp:  [14-31] 24 (02/24 1200) BP: (97-144)/(65-94) 102/80 (02/24 1100) SpO2:  [96 %-99 %] 99 % (02/24 1200) Weight:  [137 lb 11.2 oz (62.5 kg)] 137 lb 11.2 oz (62.5 kg) (02/24 0600)  Filed Weights   11/16/16 1300 11/17/16 0500 11/18/16 0600  Weight: 133 lb 6.1 oz (60.5 kg) 146 lb 9.7 oz (66.5 kg) 137 lb 11.2 oz (62.5 kg)    Weight change: 4 lb 5.2 oz (1.96 kg)   Hemodynamic parameters for last 24 hours:    Intake/Output from previous day: 02/23 0701 - 02/24 0700 In: 2075 [P.O.:1080; I.V.:580; IV Piggyback:365] Out: 2985 [Urine:2985]  Intake/Output this shift: Total I/O In: 140 [I.V.:140] Out: 275 [Urine:275]  Current Meds: Scheduled Meds: . acetaminophen  1,000 mg Oral Q6H   Or  . acetaminophen (TYLENOL) oral liquid 160 mg/5 mL  1,000 mg Per Tube Q6H  . aspirin EC  325 mg Oral Daily   Or  . aspirin  324 mg Per Tube Daily  . bisacodyl  10 mg Oral Daily   Or  . bisacodyl  10 mg Rectal Daily  . carvedilol  25 mg Oral BID WC  . docusate sodium  200 mg Oral Daily  . enoxaparin (LOVENOX) injection  40 mg Subcutaneous QHS  . mouth rinse  15 mL Mouth Rinse BID  . multivitamin with minerals  1 tablet Oral QHS  . pantoprazole  40 mg Oral Daily  . rosuvastatin  10 mg Oral Daily  . sodium chloride flush  3 mL Intravenous Q12H  . spironolactone  25 mg  Oral Daily   Continuous Infusions: . sodium chloride Stopped (11/17/16 0902)  . sodium chloride    . sodium chloride Stopped (11/17/16 0902)  . lactated ringers Stopped (11/16/16 2200)  . lactated ringers 20 mL/hr at 11/18/16 1200  . nitroGLYCERIN Stopped (11/17/16 0658)   PRN Meds:.sodium chloride, lactated ringers, morphine injection, ondansetron (ZOFRAN) IV, oxyCODONE, sodium chloride flush, traMADol  General appearance: alert, cooperative and appears older than stated age Neurologic: intact Heart: regular rate and rhythm, S1, S2 normal, no murmur, click, rub or gallop Lungs: diminished breath sounds bibasilar Abdomen: soft, non-tender; bowel sounds normal; no masses,  no organomegaly Extremities: extremities normal, atraumatic, no cyanosis or edema and Homans sign is negative, no sign of DVT Wound: sternum stable  Lab Results: CBC: Recent Labs  11/17/16 1550 11/17/16 1553 11/18/16 0346  WBC 9.0  --  8.4  HGB 11.4* 12.2 11.2*  HCT 33.4* 36.0 33.6*  PLT 169  --  163   BMET:  Recent Labs  11/17/16 0355  11/17/16 1553 11/18/16 0346  NA 134*  --  134* 135  K 4.4  --  3.8 3.8  CL 109  --  98* 102  CO2 21*  --   --  26  GLUCOSE 119*  --  113* 113*  BUN 9  --  10 14  CREATININE 0.80  < > 0.90 0.98  CALCIUM 8.9  --   --  9.6  < > = values in this interval not displayed.  CMET: Lab Results  Component Value Date   WBC 8.4 11/18/2016   HGB 11.2 (L) 11/18/2016   HCT 33.6 (L) 11/18/2016   PLT 163 11/18/2016   GLUCOSE 113 (H) 11/18/2016   ALT 35 11/14/2016   AST 31 11/14/2016   NA 135 11/18/2016   K 3.8 11/18/2016   CL 102 11/18/2016   CREATININE 0.98 11/18/2016   BUN 14 11/18/2016   CO2 26 11/18/2016   TSH 1.02 02/17/2016   INR 1.41 11/16/2016   HGBA1C 6.1 (H) 11/14/2016      PT/INR:  Recent Labs  11/16/16 1300  LABPROT 17.4*  INR 1.41   Radiology: Dg Chest Port 1 View  Result Date: 11/18/2016 CLINICAL DATA:  Aortic valve replacement. EXAM:  PORTABLE CHEST 1 VIEW COMPARISON:  11/17/2016. FINDINGS: Trachea is midline. Right IJ catheter sheath remains in place. Pacemaker and ICD lead tips are stable in position. Epicardial pacer wires are in place. Heart is enlarged, stable. Lungs are somewhat low in volume with minimal bibasilar atelectasis and tiny bilateral pleural effusions. IMPRESSION: Minimal bibasilar atelectasis and tiny bilateral pleural effusions. Electronically Signed   By: Lorin Picket M.D.   On: 11/18/2016 07:58     Assessment/Plan: S/P Procedure(s) (LRB): AORTIC VALVE REPLACEMENT (AVR) (N/A) TRANSESOPHAGEAL ECHOCARDIOGRAM (TEE) (N/A) Mobilize Diuresis Diabetes control Continue foley due to strict I&O and urinary output monitoring     [name] 11/18/2016 1:55 PM

## 2016-11-18 NOTE — Plan of Care (Signed)
Problem: Cardiac: Goal: Hemodynamic stability will improve Outcome: Completed/Met Date Met: 11/18/16 Pt showing no hemodynamic instability of pressors, AICD working appropriately.  Problem: Respiratory: Goal: Levels of oxygenation will improve Outcome: Completed/Met Date Met: 11/18/16 Pt O2 sats on room air 97-99% Goal: Respiratory status will improve Outcome: Progressing Pt maintaining adequate oxygenation after extubation. Able to demonstrate proper pulmonary toilet, I.S. 750 cc consistently. No increase in W.O.B. With activity.

## 2016-11-19 LAB — BASIC METABOLIC PANEL
Anion gap: 8 (ref 5–15)
BUN: 12 mg/dL (ref 6–20)
CO2: 24 mmol/L (ref 22–32)
Calcium: 9.5 mg/dL (ref 8.9–10.3)
Chloride: 105 mmol/L (ref 101–111)
Creatinine, Ser: 0.85 mg/dL (ref 0.44–1.00)
GFR calc Af Amer: >60 mL/min (ref >60)
GFR calc non Af Amer: >60 mL/min (ref >60)
Glucose, Bld: 98 mg/dL (ref 65–99)
Potassium: 3.9 mmol/L (ref 3.5–5.1)
Sodium: 137 mmol/L (ref 135–145)

## 2016-11-19 MED ORDER — SODIUM CHLORIDE 0.9% FLUSH
3.0000 mL | Freq: Two times a day (BID) | INTRAVENOUS | Status: DC
  Administered 2016-11-19 – 2016-11-21 (×5): 3 mL via INTRAVENOUS

## 2016-11-19 MED ORDER — SODIUM CHLORIDE 0.9 % IV SOLN: 250.0000 mL | INTRAVENOUS | Status: DC | PRN

## 2016-11-19 MED ORDER — BISACODYL 10 MG RE SUPP: 10.0000 mg | Freq: Every day | RECTAL | Status: DC | PRN

## 2016-11-19 MED ORDER — MOVING RIGHT ALONG BOOK
Freq: Once | Status: AC
  Administered 2016-11-19: 10:00:00
  Filled 2016-11-19: qty 1

## 2016-11-19 MED ORDER — ACETAMINOPHEN 325 MG PO TABS
650.0000 mg | ORAL_TABLET | Freq: Four times a day (QID) | ORAL | Status: DC | PRN
  Administered 2016-11-20 – 2016-11-21 (×3): 650 mg via ORAL
  Filled 2016-11-19 (×3): qty 2

## 2016-11-19 MED ORDER — BISACODYL 5 MG PO TBEC: 10.0000 mg | DELAYED_RELEASE_TABLET | Freq: Every day | ORAL | Status: DC | PRN

## 2016-11-19 MED ORDER — ASPIRIN EC 325 MG PO TBEC
325.0000 mg | DELAYED_RELEASE_TABLET | Freq: Every day | ORAL | Status: DC
  Administered 2016-11-20 – 2016-11-22 (×3): 325 mg via ORAL
  Filled 2016-11-19 (×3): qty 1

## 2016-11-19 MED ORDER — SODIUM CHLORIDE 0.9% FLUSH
3.0000 mL | INTRAVENOUS | Status: DC | PRN
  Administered 2016-11-21: 3 mL via INTRAVENOUS
  Filled 2016-11-19: qty 3

## 2016-11-19 MED ORDER — NAPHAZOLINE-GLYCERIN 0.012-0.2 % OP SOLN
1.0000 [drp] | Freq: Four times a day (QID) | OPHTHALMIC | Status: DC | PRN
  Filled 2016-11-19: qty 15

## 2016-11-19 MED ORDER — CARVEDILOL 25 MG PO TABS
25.0000 mg | ORAL_TABLET | Freq: Two times a day (BID) | ORAL | Status: DC
  Administered 2016-11-19 – 2016-11-22 (×6): 25 mg via ORAL
  Filled 2016-11-19 (×6): qty 1

## 2016-11-19 MED ORDER — DOCUSATE SODIUM 100 MG PO CAPS
200.0000 mg | ORAL_CAPSULE | Freq: Every day | ORAL | Status: DC
  Administered 2016-11-20 – 2016-11-22 (×3): 200 mg via ORAL
  Filled 2016-11-19 (×3): qty 2

## 2016-11-19 MED ORDER — ONDANSETRON HCL 4 MG PO TABS: 4.0000 mg | ORAL_TABLET | Freq: Four times a day (QID) | ORAL | Status: DC | PRN

## 2016-11-19 MED ORDER — NAPHAZOLINE-GLYCERIN 0.03-0.5 % OP SOLN
1.0000 [drp] | Freq: Four times a day (QID) | OPHTHALMIC | Status: DC | PRN
  Filled 2016-11-19: qty 15

## 2016-11-19 MED ORDER — INSULIN ASPART 100 UNIT/ML ~~LOC~~ SOLN: 0.0000 [IU] | Freq: Three times a day (TID) | SUBCUTANEOUS | Status: DC

## 2016-11-19 MED ORDER — ONDANSETRON HCL 4 MG/2ML IJ SOLN: 4.0000 mg | Freq: Four times a day (QID) | INTRAMUSCULAR | Status: DC | PRN

## 2016-11-19 MED ORDER — OXYCODONE HCL 5 MG PO TABS: 5.0000 mg | ORAL_TABLET | ORAL | Status: DC | PRN

## 2016-11-19 MED ORDER — TRAMADOL HCL 50 MG PO TABS: 50.0000 mg | ORAL_TABLET | ORAL | Status: DC | PRN

## 2016-11-19 MED ORDER — PANTOPRAZOLE SODIUM 40 MG PO TBEC
40.0000 mg | DELAYED_RELEASE_TABLET | Freq: Every day | ORAL | Status: DC
  Administered 2016-11-20 – 2016-11-22 (×3): 40 mg via ORAL
  Filled 2016-11-19 (×3): qty 1

## 2016-11-19 NOTE — Progress Notes (Signed)
Patient ID: [name], female   DOB: 08-26-1956, 61 y.o.   MRN: [id number] TCTS DAILY ICU PROGRESS NOTE                   [address]            [place] [address]          [phone]   3 Days Post-Op Procedure(s) (LRB): AORTIC VALVE REPLACEMENT (AVR) (N/A) TRANSESOPHAGEAL ECHOCARDIOGRAM (TEE) (N/A)  Total Length of Stay:  LOS: 3 days   Subjective: Alert, awake , walking much better today    Objective: Vital signs in last 24 hours: Temp:  [97.7 F (36.5 C)-99.7 F (37.6 C)] 98.9 F (37.2 C) (02/25 0833) Pulse Rate:  [59-80] 73 (02/25 0900) Cardiac Rhythm: Ventricular paced (02/25 0800) Resp:  [15-38] 22 (02/25 0900) BP: (85-171)/(54-101) 143/84 (02/25 0900) SpO2:  [96 %-100 %] 99 % (02/25 0900) Weight:  [135 lb 9.3 oz (61.5 kg)] 135 lb 9.3 oz (61.5 kg) (02/25 0600)  Filed Weights   11/17/16 0500 11/18/16 0600 11/19/16 0600  Weight: 146 lb 9.7 oz (66.5 kg) 137 lb 11.2 oz (62.5 kg) 135 lb 9.3 oz (61.5 kg)    Weight change: -2 lb 1.9 oz (-0.96 kg)   Hemodynamic parameters for last 24 hours:    Intake/Output from previous day: 02/24 0701 - 02/25 0700 In: 1580 [P.O.:1080; I.V.:500] Out: 2545 [Urine:2545]  Intake/Output this shift: Total I/O In: -  Out: 200 [Urine:200]  Current Meds: Scheduled Meds: . acetaminophen  1,000 mg Oral Q6H   Or  . acetaminophen (TYLENOL) oral liquid 160 mg/5 mL  1,000 mg Per Tube Q6H  . aspirin EC  325 mg Oral Daily   Or  . aspirin  324 mg Per Tube Daily  . bisacodyl  10 mg Oral Daily   Or  . bisacodyl  10 mg Rectal Daily  . carvedilol  25 mg Oral BID WC  . docusate sodium  200 mg Oral Daily  . enoxaparin (LOVENOX) injection  40 mg Subcutaneous QHS  . mouth rinse  15 mL Mouth Rinse BID  . multivitamin with minerals  1 tablet Oral QHS  . pantoprazole  40 mg Oral Daily  . rosuvastatin  10 mg Oral Daily  . sodium chloride flush  3 mL Intravenous Q12H  . spironolactone  25 mg Oral Daily   Continuous  Infusions: . sodium chloride Stopped (11/17/16 0902)  . sodium chloride    . sodium chloride Stopped (11/17/16 0902)  . lactated ringers Stopped (11/16/16 2200)  . lactated ringers Stopped (11/19/16 0600)  . nitroGLYCERIN Stopped (11/18/16 1832)   PRN Meds:.sodium chloride, lactated ringers, ondansetron (ZOFRAN) IV, oxyCODONE, sodium chloride flush, traMADol  General appearance: alert, cooperative and appears older than stated age Neurologic: intact Heart: regular rate and rhythm, S1, S2 normal, no murmur, click, rub or gallop Lungs: diminished breath sounds bibasilar Abdomen: soft, non-tender; bowel sounds normal; no masses,  no organomegaly Extremities: extremities normal, atraumatic, no cyanosis or edema and Homans sign is negative, no sign of DVT Wound: sternum stable  Lab Results: CBC: Recent Labs  11/17/16 1550  11/18/16 0346 11/18/16 1748  WBC 9.0  --  8.4  --   HGB 11.4*  < > 11.2* 11.9*  HCT 33.4*  < > 33.6* 35.0*  PLT 169  --  163  --   < > = values in this interval not displayed. BMET:   Recent Labs  11/18/16 0346 11/18/16  1748 11/19/16 0430  NA 135 137 137  K 3.8 3.4* 3.9  CL 102  --  105  CO2 26  --  24  GLUCOSE 113* 101* 98  BUN 14  --  12  CREATININE 0.98  --  0.85  CALCIUM 9.6  --  9.5    CMET: Lab Results  Component Value Date   WBC 8.4 11/18/2016   HGB 11.9 (L) 11/18/2016   HCT 35.0 (L) 11/18/2016   PLT 163 11/18/2016   GLUCOSE 98 11/19/2016   ALT 35 11/14/2016   AST 31 11/14/2016   NA 137 11/19/2016   K 3.9 11/19/2016   CL 105 11/19/2016   CREATININE 0.85 11/19/2016   BUN 12 11/19/2016   CO2 24 11/19/2016   TSH 1.02 02/17/2016   INR 1.41 11/16/2016   HGBA1C 6.1 (H) 11/14/2016      PT/INR:   Recent Labs  11/16/16 1300  LABPROT 17.4*  INR 1.41   Radiology: No results found.   Assessment/Plan: S/P Procedure(s) (LRB): AORTIC VALVE REPLACEMENT (AVR) (N/A) TRANSESOPHAGEAL ECHOCARDIOGRAM (TEE)  (N/A) Mobilize Diuresis Diabetes control Plan for transfer to step-down: see transfer orders     [name] 11/19/2016 9:50 AMPatient ID: [name], female   DOB: [birth year], 61 y.o.   MRN: [id number]

## 2016-11-19 NOTE — Plan of Care (Signed)
Problem: Activity: Goal: Risk for activity intolerance will decrease Outcome: Progressing Pt tolerating getting OOB for meals and increasing ambulation distance. Periods of rest provided, Cardiac rehab consulted.  Problem: Respiratory: Goal: Respiratory status will improve Outcome: Completed/Met Date Met: 11/19/16 Pt continues to maintain good O2 sats on room air and demonstrates good pulmonary toilet exercises. Lung sounds clear and aeration improving on CXR.  Problem: Pain Management: Goal: Pain level will decrease Outcome: Progressing Pt's pain level is tolerable on current regime of scheduled Tylenol and occasional analgesic supplement.

## 2016-11-19 NOTE — Progress Notes (Signed)
Patient ID: [name], female   DOB: 1955-10-07, 61 y.o.   MRN: [id number] EVENING ROUNDS NOTE :     [address]       [place] [address]             [phone]                 3 Days Post-Op Procedure(s) (LRB): AORTIC VALVE REPLACEMENT (AVR) (N/A) TRANSESOPHAGEAL ECHOCARDIOGRAM (TEE) (N/A)  Total Length of Stay:  LOS: 3 days  BP 113/76   Pulse 79   Temp 99.1 F (37.3 C) (Oral)   Resp (!) 21   Ht 5\' 4"  (1.626 m)   Wt 135 lb 9.3 oz (61.5 kg)   SpO2 100%   BMI 23.27 kg/m   .Intake/Output      02/25 0701 - 02/26 0700   P.O. 600   I.V. (mL/kg)    Total Intake(mL/kg) 600 (9.8)   Urine (mL/kg/hr) 1425 (1.9)   Total Output 1425   Net -825            Lab Results  Component Value Date   WBC 8.4 11/18/2016   HGB 11.9 (L) 11/18/2016   HCT 35.0 (L) 11/18/2016   PLT 163 11/18/2016   GLUCOSE 98 11/19/2016   ALT 35 11/14/2016   AST 31 11/14/2016   NA 137 11/19/2016   K 3.9 11/19/2016   CL 105 11/19/2016   CREATININE 0.85 11/19/2016   BUN 12 11/19/2016   CO2 24 11/19/2016   TSH 1.02 02/17/2016   INR 1.41 11/16/2016   HGBA1C 6.1 (H) 11/14/2016   Stable day , waiting for step down bed   [name] MD  Beeper [phone] Office [phone] 11/19/2016 7:32 PM

## 2016-11-20 ENCOUNTER — Inpatient Hospital Stay (HOSPITAL_COMMUNITY): Payer: Medicare HMO

## 2016-11-20 LAB — BASIC METABOLIC PANEL
Anion gap: 10 (ref 5–15)
BUN: 11 mg/dL (ref 6–20)
CO2: 21 mmol/L — ABNORMAL LOW (ref 22–32)
Calcium: 10 mg/dL (ref 8.9–10.3)
Chloride: 105 mmol/L (ref 101–111)
Creatinine, Ser: 0.78 mg/dL (ref 0.44–1.00)
GFR calc Af Amer: >60 mL/min (ref >60)
GFR calc non Af Amer: >60 mL/min (ref >60)
Glucose, Bld: 107 mg/dL — ABNORMAL HIGH (ref 65–99)
Potassium: 3.8 mmol/L (ref 3.5–5.1)
Sodium: 136 mmol/L (ref 135–145)

## 2016-11-20 LAB — CBC
HCT: 35.3 % — ABNORMAL LOW (ref 36.0–46.0)
Hemoglobin: 12.1 g/dL (ref 12.0–15.0)
MCH: 29.4 pg (ref 26.0–34.0)
MCHC: 34.3 g/dL (ref 30.0–36.0)
MCV: 85.7 fL (ref 78.0–100.0)
Platelets: 157 10*3/uL (ref 150–400)
RBC: 4.12 MIL/uL (ref 3.87–5.11)
RDW: 15.2 % (ref 11.5–15.5)
WBC: 7 10*3/uL (ref 4.0–10.5)

## 2016-11-20 NOTE — Care Management Note (Signed)
Case Management Note  Patient Details  Name: [name] MRN: [id number] Date of Birth: 20-Nov-1955  Subjective/Objective:     S/p AVR               Action/Plan:   Pt alert and oriented with nieces at bedside during assessment.  PTA pt was home alone independent.  Pt and family voiced concerns regarding pt returning home without 24/7 supervision post discharge - family are in the process of trying to arrange coverage but has not been able to firm up a plan.  CM informed both pt and family that PT will be ordered when medically stable (pt is currently on pressor) for evaluation to help determine appropriate discharge plan.  CM requested PT order via bedside nurse and physician sticky tab.  CM will continue to follow for discharge needs  2/26 Sheldahl RN, BSN-  S/p AVR replacement,  Alert and oriented, patient states [name] will be with her for one week after she discharges home.  Per pt eval no pt f/u needed, she will need  Cardiac Rehab which will be set up at the cardiologist office at her follow up apt.  This was explained to patient.  She states she does not want a rolling walker. She has a PCP Dr. Blanchard Mane at [facility], she has medication coverage and she will have transportation at discharge. NCM will cont to follow for dc needs.                  Action/Plan:   Expected Discharge Date:                  Expected Discharge Plan:  Home/Self Care  In-House Referral:  Clinical Social Work  Discharge planning Services  CM Consult  Post Acute Care Choice:    Choice offered to:     DME Arranged:    DME Agency:     HH Arranged:    HH Agency:     Status of Service:  In process, will continue to follow  If discussed at Long Length of Stay Meetings, dates discussed:    Additional Comments:  Zenon Mayo, RN 11/20/2016, 11:26 AM

## 2016-11-20 NOTE — Plan of Care (Signed)
Problem: Activity: Goal: Risk for activity intolerance will decrease Outcome: Completed/Met Date Met: 11/20/16 Pt able to ambulate with minimal assistance 300' 3 times per day. Transfers OOB to [facility] and to chair without difficulty. Pt reports adequate sleep and naps.  Problem: Nutritional: Goal: Risk for body nutrition deficit will decrease Outcome: Progressing Pt advanced to Heart healthy diet and tolerating well with intake of 50-75 % each meal. No B.M. but passing flatus.  Problem: Urinary Elimination: Goal: Ability to achieve and maintain adequate renal perfusion and functioning will improve Outcome: Progressing Pt voiding post foley removal. Weigh down closer to pre-op weight. I/O balance remains (-)

## 2016-11-20 NOTE — Anesthesia Postprocedure Evaluation (Signed)
Anesthesia Post Note  Patient: [name]  Procedure(s) Performed: Procedure(s) (LRB): AORTIC VALVE REPLACEMENT (AVR) (N/A) TRANSESOPHAGEAL ECHOCARDIOGRAM (TEE) (N/A)  Patient location during evaluation: ICU Anesthesia Type: General Level of consciousness: sedated and patient remains intubated per anesthesia plan Pain management: pain level controlled Vital Signs Assessment: post-procedure vital signs reviewed and stable Respiratory status: patient remains intubated per anesthesia plan and patient on ventilator - see flowsheet for VS Cardiovascular status: stable Anesthetic complications: no       Last Vitals:  Vitals:   11/20/16 0700 11/20/16 0800  BP:  (!) 153/94  Pulse:  77  Resp:  (!) 22  Temp: 36.9 C     Last Pain:  Vitals:   11/20/16 0700  TempSrc: Oral  PainSc:                  [place]

## 2016-11-20 NOTE — Progress Notes (Signed)
4 Days Post-Op Procedure(s) (LRB): AORTIC VALVE REPLACEMENT (AVR) (N/A) TRANSESOPHAGEAL ECHOCARDIOGRAM (TEE) (N/A) Subjective:  No complaints  Objective: Vital signs in last 24 hours: Temp:  [98.4 F (36.9 C)-99.1 F (37.3 C)] 98.5 F (36.9 C) (02/26 0700) Pulse Rate:  [67-84] 72 (02/26 0400) Cardiac Rhythm: Ventricular paced (02/25 2000) Resp:  [18-38] 19 (02/26 0400) BP: (113-166)/(75-94) 125/83 (02/26 0400) SpO2:  [96 %-100 %] 96 % (02/26 0400)  Hemodynamic parameters for last 24 hours:    Intake/Output from previous day: 02/25 0701 - 02/26 0700 In: 960 [P.O.:960] Out: 2425 [Urine:2425] Intake/Output this shift: No intake/output data recorded.  General appearance: alert and cooperative Neurologic: intact Heart: regular rate and rhythm, S1, S2 normal, no murmur, click, rub or gallop Lungs: diminished breath sounds bibasilar Extremities: extremities normal, atraumatic, no cyanosis or edema Wound: dressing dry  Lab Results:  Recent Labs  11/18/16 0346 11/18/16 1748 11/20/16 0244  WBC 8.4  --  7.0  HGB 11.2* 11.9* 12.1  HCT 33.6* 35.0* 35.3*  PLT 163  --  157   BMET:  Recent Labs  11/19/16 0430 11/20/16 0244  NA 137 136  K 3.9 3.8  CL 105 105  CO2 24 21*  GLUCOSE 98 107*  BUN 12 11  CREATININE 0.85 0.78  CALCIUM 9.5 10.0    PT/INR: No results for input(s): LABPROT, INR in the last 72 hours. ABG    Component Value Date/Time   PHART 7.348 (L) 11/16/2016 2139   HCO3 20.3 11/16/2016 2139   TCO2 26 11/17/2016 1553   ACIDBASEDEF 5.0 (H) 11/16/2016 2139   O2SAT 95.0 11/16/2016 2139   CBG (last 3)  No results for input(s): GLUCAP in the last 72 hours.  CXR: mild bibasilar atelectasis.  Assessment/Plan: S/P Procedure(s) (LRB): AORTIC VALVE REPLACEMENT (AVR) (N/A) TRANSESOPHAGEAL ECHOCARDIOGRAM (TEE) (N/A)  She is doing well POD 4. Continue ambulation and IS. Transfer to 2W telemetry. May be ready to go home tomorrow.   LOS: 4 days    Gaye Pollack 11/20/2016

## 2016-11-20 NOTE — Progress Notes (Signed)
CARDIAC REHAB PHASE I   Pt recently transferred from ICU, states she has walked three times today. Gave pt moving right along book to review, will follow up tomorrow.   Lenna Sciara, RN, BSN 11/20/2016 3:15 PM

## 2016-11-20 NOTE — Progress Notes (Signed)
Physical Therapy Treatment Patient Details Name: [name] MRN: [id number] DOB: 1956/05/22 Today's Date: 11/20/2016    History of Present Illness Patient is a 61 yo female admitted 11/16/16 with AS now s/p AVR on 11/16/16.    PMH:  NICM, pacemaker, CHF, LBBB    PT Comments    Pt progressing well. Pt able to begin ambulation without AD and tolerated stair negotiation well and adhered to sternal precautions. Anticipate in the next couple of days pt to advance to safe mod I level of function for safe d/c home once medically appropriate.   Follow Up Recommendations  Supervision - Intermittent;No PT follow up (transition to cardiac rehab when appropriate)     Equipment Recommendations  Rolling walker with 5" wheels;3in1 (PT)    Recommendations for Other Services       Precautions / Restrictions Precautions Precautions: Sternal Precaution Comments: Reviewed sternal precautions with patient. Restrictions Weight Bearing Restrictions: Yes Other Position/Activity Restrictions: sternal precautions    Mobility  Bed Mobility               General bed mobility comments: pt up in chair upon PT arrival  Transfers Overall transfer level: Needs assistance Equipment used: None Transfers: Sit to/from Stand Sit to Stand: Min guard         General transfer comment: Verbal cues for technique.  Patient holding pillow at chest.  Assist to translate trunk forward over feet and rise to standing.  Patient able to use LE's to power up to stance. Pt able to complete 2 trials of sit to stand with supervision  Ambulation/Gait Ambulation/Gait assistance: Min guard Ambulation Distance (Feet): 300 Feet   Gait Pattern/deviations: Step-through pattern;Decreased stride length Gait velocity: dec Gait velocity interpretation: Below normal speed for age/gender General Gait Details: pt guarded and slow. pt initially amb with RW at supervision transitioned to no AD with min guard. pt with no  overt LOB. pt with increased step length with RW but was steady both with and without RW   Stairs Stairs: Yes   Stair Management: One rail Left;Sideways Number of Stairs: 12 General stair comments: v/c's for sequencing, dec pull on UEs increased use of LEs  Wheelchair Mobility    Modified Rankin (Stroke Patients Only)       Balance Overall balance assessment: Needs assistance Sitting-balance support: No upper extremity supported;Feet supported Sitting balance-Leahy Scale: Good     Standing balance support: No upper extremity supported Standing balance-Leahy Scale: Fair                      Cognition Arousal/Alertness: Awake/alert Behavior During Therapy: WFL for tasks assessed/performed Overall Cognitive Status: Within Functional Limits for tasks assessed                      Exercises      General Comments General comments (skin integrity, edema, etc.): discussed not doing laundry or lifting more than a gallon of milk      Pertinent Vitals/Pain Pain Assessment: No/denies pain    Home Living                      Prior Function            PT Goals (current goals can now be found in the care plan section) Acute Rehab PT Goals Patient Stated Goal: home PT Goal Formulation: With patient Time For Goal Achievement: 11/25/16 Potential to Achieve Goals: Good Progress towards PT  goals: Progressing toward goals    Frequency    Min 3X/week      PT Plan Discharge plan needs to be updated    Co-evaluation             End of Session Equipment Utilized During Treatment: Gait belt Activity Tolerance: Patient tolerated treatment well;Patient limited by fatigue Patient left: in chair;with call bell/phone within reach Nurse Communication: Mobility status PT Visit Diagnosis: Other abnormalities of gait and mobility (R26.89)     Time: 8022-1798 PT Time Calculation (min) (ACUTE ONLY): 23 min  Charges:  $Gait Training: 23-37  mins                    G CodesKoleen Distance Indra Wolters 2016/11/21, 9:45 AM   Kittie Plater, PT, DPT Pager #: [phone] Office #: [phone]

## 2016-11-21 NOTE — Discharge Instructions (Signed)
Aortic Valve Replacement, Care After °Refer to this sheet in the next few weeks. These instructions provide you with information about caring for yourself after your procedure. Your health care provider may also give you more specific instructions. Your treatment has been planned according to current medical practices, but problems sometimes occur. Call your health care provider if you have any problems or questions after your procedure. °What can I expect after the procedure? °After the procedure, it is common to have: °· Pain around your incision area. °· A small amount of blood or clear fluid coming from your incision. ° °Follow these instructions at home: °Eating and drinking ° °· Follow instructions from your health care provider about eating or drinking restrictions. °? Limit alcohol intake to no more than 1 drink per day for nonpregnant women and 2 drinks per day for men. One drink equals 12 oz of beer, 5 oz of wine, or 1½ oz of hard liquor. °? Limit how much caffeine you drink. Caffeine can affect your heart's rate and rhythm. °· Drink enough fluid to keep your urine clear or pale yellow. °· Eat a heart-healthy diet. This should include plenty of fresh fruits and vegetables. If you eat meat, it should be lean cuts. Avoid foods that are: °? High in salt, saturated fat, or sugar. °? Canned or highly processed. °? Fried. °Activity °· Return to your normal activities as told by your health care provider. Ask your health care provider what activities are safe for you. °· Exercise regularly once you have recovered, as told by your health care provider. °· Avoid sitting for more than 2 hours at a time without moving. Get up and move around at least once every 1-2 hours. This helps to prevent blood clots in the legs. °· Do not lift anything that is heavier than 10 lb (4.5 kg) until your health care provider approves. °· Avoid pushing or pulling things with your arms until your health care provider approves. This  includes pulling on handrails to help you climb stairs. °Incision care ° °· Follow instructions from your health care provider about how to take care of your incision. Make sure you: °? Wash your hands with soap and water before you change your bandage (dressing). If soap and water are not available, use hand sanitizer. °? Change your dressing as told by your health care provider. °? Leave stitches (sutures), skin glue, or adhesive strips in place. These skin closures may need to stay in place for 2 weeks or longer. If adhesive strip edges start to loosen and curl up, you may trim the loose edges. Do not remove adhesive strips completely unless your health care provider tells you to do that. °· Check your incision area every day for signs of infection. Check for: °? More redness, swelling, or pain. °? More fluid or blood. °? Warmth. °? Pus or a bad smell. °Medicines °· Take over-the-counter and prescription medicines only as told by your health care provider. °· If you were prescribed an antibiotic medicine, take it as told by your health care provider. Do not stop taking the antibiotic even if you start to feel better. °Travel °· Avoid airplane travel for as long as told by your health care provider. °· When you travel, bring a list of your medicines and a record of your medical history with you. Carry your medicines with you. °Driving °· Ask your health care provider when it is safe for you to drive. Do not drive until your health   care provider approves. °· Do not drive or operate heavy machinery while taking prescription pain medicine. °Lifestyle ° °· Do not use any tobacco products, such as cigarettes, chewing tobacco, or e-cigarettes. If you need help quitting, ask your health care provider. °· Resume sexual activity as told by your health care provider. Do not use medicines for erectile dysfunction unless your health care provider approves, if this applies. °· Work with your health care provider to keep your  blood pressure and cholesterol under control, and to manage any other heart conditions that you have. °· Maintain a healthy weight. °General instructions °· Do not take baths, swim, or use a hot tub until your health care provider approves. °· Do not strain to have a bowel movement. °· Avoid crossing your legs while sitting down. °· Check your temperature every day for a fever. A fever may be a sign of infection. °· If you are a woman and you plan to become pregnant, talk with your health care provider before you become pregnant. °· Wear compression stockings if your health care provider instructs you to do this. These stockings help to prevent blood clots and reduce swelling in your legs. °· Tell all health care providers who care for you that you have an artificial (prosthetic) aortic valve. If you have or have had heart disease or endocarditis, tell all health care providers about these conditions as well. °· Keep all follow-up visits as told by your health care provider. This is important. °Contact a health care provider if: °· You develop a skin rash. °· You experience sudden, unexplained changes in your weight. °· You have more redness, swelling, or pain around your incision. °· You have more fluid or blood coming from your incision. °· Your incision feels warm to the touch. °· You have pus or a bad smell coming from your incision. °· You have a fever. °Get help right away if: °· You develop chest pain that is different from the pain coming from your incision. °· You develop shortness of breath or difficulty breathing. °· You start to feel light-headed. °These symptoms may represent a serious problem that is an emergency. Do not wait to see if the symptoms will go away. Get medical help right away. Call your local emergency services (911 in the [place] Do not drive yourself to the hospital. °This information is not intended to replace advice given to you by your health care provider. Make sure you discuss any  questions you have with your health care provider. °Document Released: 03/30/2005 Document Revised: 02/17/2016 Document Reviewed: 08/15/2015 °Elsevier Interactive Patient Education © 2017 [employer] Inc[employer] ° °

## 2016-11-21 NOTE — Care Management Important Message (Signed)
Important Message  Patient Details  Name: [name] MRN: [id number] Date of Birth: 02-24-1956   Medicare Important Message Given:  Yes    Nathen May 11/21/2016, 10:42 AM

## 2016-11-21 NOTE — Progress Notes (Signed)
CARDIAC REHAB PHASE I   PRE:  Rate/Rhythm: 76 paced  BP:  Sitting: 118/73        SaO2: 99 RA  MODE:  Ambulation: 550 ft   POST:  Rate/Rhythm: 74 paced  BP:  Sitting: 133/65          SaO2: 100 RA  Pt very independent, ambulated 550 ft on RA, rolling walker, steady gait, tolerated well with no complaints. Pt to bed for pt request after walk, call bell within reach. Will follow.  [place], RN, BSN 11/21/2016 3:01 PM

## 2016-11-21 NOTE — Consult Note (Signed)
   [facility] CM Primary Care Navigator  11/21/2016  [name] 20-Jul-1956 [id number]   Met with patient at the bedside to identify possible discharge needs. Patient reports "feeling so easily tired" that had led to this admission.  Patient confirms Dr. Lauretta Grill [name]  with [facility] as the primary care provider.  Patient shared using [name] at [facility] to obtain medications without difficulty.   Patient states managing her own medications at home using "pill box" system weekly.  Patient reports being able to drive prior to admission. She mentioned that a friend [name]) will be providing transportation to her doctors' appointments after discharge. [employer] transportation benefit was discussed with her as well, in case she will be needing transportation to her primary doctors' appointments. Patient was very grateful.  She reports that her friend [name]) will be able to stay with her while she recovers to assist with care.  Anticipated discharge plan is home per patient.  Patient voiced understanding to call primary care provider's office when she gets back home for a post discharge follow-up within a week or sooner if needs arise. Patient letter (with PCP's contact number) was provided as her reminder.  Discussed with patient regarding [facility] care management services available for her health management and sheopted to receiveEMMI calls to help her manage HF at home rather than home visits. Referral was made for EMMI HFcalls after discharge.   For additional questions please contact:  Edwena Felty A. Sharlet Notaro, BSN, RN-BC [facility] PRIMARY CARE Navigator Cell: [phone]

## 2016-11-21 NOTE — Progress Notes (Addendum)
[address]       [place] [address]             [phone]      5 Days Post-Op Procedure(s) (LRB): AORTIC VALVE REPLACEMENT (AVR) (N/A) TRANSESOPHAGEAL ECHOCARDIOGRAM (TEE) (N/A) Subjective: Feels pretty well over all  Objective: Vital signs in last 24 hours: Temp:  [98.2 F (36.8 C)-98.9 F (37.2 C)] 98.9 F (37.2 C) (02/27 0452) Pulse Rate:  [62-79] 70 (02/27 0452) Cardiac Rhythm: Ventricular paced (02/27 0700) Resp:  [17-22] 17 (02/27 0452) BP: (103-153)/(63-94) 103/63 (02/27 0452) SpO2:  [96 %-100 %] 100 % (02/27 0452) Weight:  [129 lb 8 oz (58.7 kg)-132 lb 11.2 oz (60.2 kg)] 129 lb 8 oz (58.7 kg) (02/27 0313)  Hemodynamic parameters for last 24 hours:    Intake/Output from previous day: 02/26 0701 - 02/27 0700 In: 50 [P.O.:50] Out: -  Intake/Output this shift: No intake/output data recorded.  General appearance: alert, cooperative and no distress Heart: regular rate and rhythm and soft systolic murmur Lungs: mildly dim in bases Abdomen: benign Extremities: no edema Wound: incis healing well  Lab Results:  Recent Labs  11/18/16 1748 11/20/16 0244  WBC  --  7.0  HGB 11.9* 12.1  HCT 35.0* 35.3*  PLT  --  157   BMET:  Recent Labs  11/19/16 0430 11/20/16 0244  NA 137 136  K 3.9 3.8  CL 105 105  CO2 24 21*  GLUCOSE 98 107*  BUN 12 11  CREATININE 0.85 0.78  CALCIUM 9.5 10.0    PT/INR: No results for input(s): LABPROT, INR in the last 72 hours. ABG    Component Value Date/Time   PHART 7.348 (L) 11/16/2016 2139   HCO3 20.3 11/16/2016 2139   TCO2 26 11/17/2016 1553   ACIDBASEDEF 5.0 (H) 11/16/2016 2139   O2SAT 95.0 11/16/2016 2139   CBG (last 3)  No results for input(s): GLUCAP in the last 72 hours.  Meds Scheduled Meds: . aspirin EC  325 mg Oral Daily  . carvedilol  25 mg Oral BID WC  . docusate sodium  200 mg Oral Daily  . enoxaparin (LOVENOX) injection  40 mg Subcutaneous QHS  . mouth rinse  15 mL Mouth  Rinse BID  . multivitamin with minerals  1 tablet Oral QHS  . pantoprazole  40 mg Oral QAC breakfast  . rosuvastatin  10 mg Oral Daily  . sodium chloride flush  3 mL Intravenous Q12H  . spironolactone  25 mg Oral Daily   Continuous Infusions: PRN Meds:.sodium chloride, acetaminophen, bisacodyl **OR** bisacodyl, Naphazoline-Glycerin, ondansetron **OR** ondansetron (ZOFRAN) IV, oxyCODONE, sodium chloride flush, traMADol  Xrays Dg Chest 2 View  Result Date: 11/20/2016 CLINICAL DATA:  Status post aortic valve replacement 4 days ago. EXAM: CHEST  2 VIEW COMPARISON:  Portable chest x-ray of November 18, 2016 FINDINGS: The lungs are well-expanded. There is bibasilar density compatible with subsegmental atelectasis. Small pleural effusions layer posteriorly. The cardiac silhouette is top-normal in size. The pulmonary vascularity is normal. The ICD is in stable position. The mediastinum is normal in width. There is calcification in the wall of the aortic arch. The sternal wires are intact. The aortic valve prosthesis is in stable position. IMPRESSION: No CHF. Persistent bibasilar atelectasis and small posterior pleural effusions. Thoracic aortic atherosclerosis. Electronically Signed   By: David  Martinique M.D.   On: 11/20/2016 07:14    Assessment/Plan: S/P Procedure(s) (LRB): AORTIC VALVE REPLACEMENT (AVR) (N/A) TRANSESOPHAGEAL ECHOCARDIOGRAM (TEE) (  N/A) 1 doing well 2 cont routine pulm toilet and rehab 3 d/c wires 4 she is trying to make home arrangements for a friend to stay with her short term 5 I think we can stop spironolactone- BP is good , below preop wt   LOS: 5 days    GOLD,WAYNE E 11/21/2016   Chart reviewed, patient examined, agree with above. She looks great. Plan home tomorrow with her friend.

## 2016-11-21 NOTE — Progress Notes (Signed)
Patient [name] pulled per protocol and as ordered all ends intact. Pt Left Ventrical wire met with some resistance. Patient tolerated well. Will monitor patient. Sefora Tietje, Bettina Gavia RN

## 2016-11-21 NOTE — Progress Notes (Signed)
Physical Therapy Treatment Patient Details Name: [name] MRN: [id number] DOB: [birth year] Today's Date: 11/21/2016    History of Present Illness Patient is a 61 yo female admitted 11/16/16 with AS now s/p AVR on 11/16/16.    PMH:  NICM, pacemaker, CHF, LBBB    PT Comments    Pt progressing well. Pt now functioning at supervision level. No AD needed for ambulation. Spoke with son Gerald Stabs regarding d/c recommendations to return home as opposed to ST-SNF. Son receptive. Encouraged family to assist with laundry, driving, and to be present when patient bathing. Acute PT to con't to follow.    Follow Up Recommendations  No PT follow up;Supervision/Assistance - 24 hour (transistion to cardiac rehab)     Equipment Recommendations  Rolling walker with 5" wheels    Recommendations for Other Services       Precautions / Restrictions Precautions Precautions: Sternal Precaution Comments: pt able to recall precautions but con't to require v/c's to adhere to functionally Restrictions Weight Bearing Restrictions: Yes Other Position/Activity Restrictions: sternal precautions    Mobility  Bed Mobility Overal bed mobility: Needs Assistance Bed Mobility: Rolling;Sidelying to Sit;Sit to Sidelying Rolling: Supervision Sidelying to sit: Supervision     Sit to sidelying: Supervision General bed mobility comments: practiced log rolling technique  Transfers Overall transfer level: Needs assistance Equipment used: None Transfers: Sit to/from Stand Sit to Stand: Supervision         General transfer comment: v/c's to cross arms over chest and not push up from bed. pt able to complete without physical assist or instability  Ambulation/Gait Ambulation/Gait assistance: Supervision Ambulation Distance (Feet): 300 Feet   Gait Pattern/deviations: Step-through pattern;Decreased stride length Gait velocity: dec Gait velocity interpretation: Below normal speed for age/gender General Gait  Details: pt guarded and slow, pt wearing slip on slippers causing decrased step length and height, eduated pt to wear shoes with heals or grip socks   Stairs Stairs: Yes   Stair Management: One rail Left;Sideways Number of Stairs: 12 General stair comments: pt able to recall appropriate sequencing  Wheelchair Mobility    Modified Rankin (Stroke Patients Only)       Balance Overall balance assessment: Modified Independent Sitting-balance support: No upper extremity supported;Feet supported Sitting balance-Leahy Scale: Good     Standing balance support: No upper extremity supported Standing balance-Leahy Scale: Fair                      Cognition Arousal/Alertness: Awake/alert Behavior During Therapy: WFL for tasks assessed/performed Overall Cognitive Status: Within Functional Limits for tasks assessed                      Exercises      General Comments General comments (skin integrity, edema, etc.): discussed Upper body dressing while adhereing to sternal precautions      Pertinent Vitals/Pain Pain Assessment: No/denies pain    Home Living                      Prior Function            PT Goals (current goals can now be found in the care plan section) Acute Rehab PT Goals Patient Stated Goal: home Progress towards PT goals: Progressing toward goals    Frequency    Min 3X/week      PT Plan Current plan remains appropriate    Co-evaluation  End of Session Equipment Utilized During Treatment: Gait belt Activity Tolerance: Patient tolerated treatment well;Patient limited by fatigue Patient left: in chair;with call bell/phone within reach Nurse Communication: Mobility status PT Visit Diagnosis: Other abnormalities of gait and mobility (R26.89)     Time: 3570-1779 PT Time Calculation (min) (ACUTE ONLY): 24 min  Charges:  $Gait Training: 8-22 mins $Therapeutic Activity: 8-22 mins                    G  CodesBerline Lopes 11/30/2016, 9:43 AM   Kittie Plater, PT, DPT Pager #: [phone] Office #: [phone]

## 2016-11-21 NOTE — Discharge Summary (Signed)
Physician Discharge Summary  Patient ID: [name] MRN: [id number] DOB/AGE: 04/13/1956 61 y.o.  Admit date: 11/16/2016 Discharge date: 11/22/2016  Admission Diagnoses:Severe aortic stenosis  Discharge Diagnoses:  Active Problems:   Aortic stenosis  Patient Active Problem List   Diagnosis Date Noted  . Aortic stenosis 11/16/2016  . Severe aortic stenosis   . Polycythemia 06/06/2016  . Menopausal state 02/16/2016  . Hyperparathyroidism ([place]) 02/16/2016  . Chronic systolic heart failure ([place]) 05/07/2014  . Hx of colonic polyps 06/17/2013  . Ventricular tachycardia ([place]) 02/21/2011  . Automatic implantable cardioverter-defibrillator in situ 06/02/2009  . HYPERLIPIDEMIA 05/29/2009  . SMOKER 05/29/2009  . HYPERTENSION 05/29/2009  . Aortic valve disorder 05/29/2009  . CARDIOMYOPATHY, SECONDARY 05/29/2009  . CHF 05/29/2009  . BICUSPID AORTIC VALVE 05/29/2009  . LIVER FUNCTION TESTS, ABNORMAL, HX OF 05/29/2009  . CHOLECYSTITIS, HX OF 05/29/2009    HPI:  The patient is a 61 year old woman with hypertension, hyperlipidemia and a remote history of non-ischemic cardiomyopathy dating back to 05/2002. She developed shortness of breath at that time and an echo showed an EF of 20-30% with mild AS, mild-moderate AI, and mild-moderate MR. She was treated medically with improvement and subsequently underwent implantation of a Bi-V ICD on 03/24/2005 for NICM with class III CHF and LBBB morphology. The etiology of her cardiomyopathy at that time was felt to be valvular heart disease and HTN. She says that she felt much better after that and a follow-up echo in 04/2008 showed an improvement in her EF to 65% with overall normal LV function, moderate AS with a mean gradient of 21 mm Hg and an AVA of 1.08 cm2 with mild to moderate AI, mild MR. She has a repeat echo in 02/2012 that showed an increased in the AV gradient to 42 mm Hg consistent with severe AS. The mean gradient was 38 mm Hg in 04/2014. She  had an echo in 10/2015 that showed an increase in the mean gradient to 51 mm Hg with an AVA of 0.81 cm2 with moderate AI and normal LV function with moderate LVH. She was seen by Dr. Angelena Form at that time but was asymptomatic. A repeat echo in 05/2016 showed a mean gradient of 47 mm Hg with an AVA of 0.79 cm2 with mild AI and mild MR. She now says that she has developed exertional fatigue and tiredness. She denies any chest pain or pressure and has no shortness of breath. She describes some mental cloudiness and memory disturbance. Her niece and son said that they have noted some decline in her activity and functional capacity. She had not seen a dentist in years but  saw her dentist and had a tooth extracted and was told that her teeth were in adequate condition for valve surgery.   Discharged Condition: good  Hospital Course:  The patient was admitted electively and on 11/16/2016 was taken to the operating room where she underwent the below described procedure. She tolerated well and was taken to the surgical intensive care unit in stable condition  Postoperative hospital course:  Overall the patient has progressed nicely. She was weaned from the ventilator without difficulties using standard protocols. All routine lines, monitors and drainage devices have been discontinued in the standard fashion. She has some postoperative volume overload but is responding well to diuretics. She is V paced and has permanent AICD/pacer. She has a very minor acute blood loss anemia which is stable. Incisions are noted be healing well without evidence of infection.  She is tolerating diet and routine activities using standard cardiac rehabilitation phase 1 modalities. At time of discharge she is felt to be quite stable.   Consults: None  Significant Diagnostic Studies: routine post op labs/serial CXR's  Treatments: surgery:  11/16/2016 [name] [id number]  Surgeon:  Gaye Pollack, MD  First Assistant:  Lars Pinks, PA-C   Preoperative Diagnosis:  Severe aortic stenosis   Postoperative Diagnosis:  Same   Procedure:  1. Median Sternotomy 2. Extracorporeal circulation 3.   Aortic valve replacement using a 19 mm Edwards Magna-Ease pericardial valve.  Anesthesia:  General Endotracheal Discharge Exam:  General appearance: alert, cooperative and no distress Heart: regular rate and rhythm, systolic murmur Lungs: clear to auscultation bilaterally Abdomen: benign Extremities: no edema Wound: incis healing well Disposition: 01-Home or Self Care  Discharge Instructions    Discharge patient    Complete by:  As directed    Discharge disposition:  01-Home or Self Care   Discharge patient date:  11/22/2016     Allergies as of 11/22/2016      Reactions   No Known Allergies       Medication List    STOP taking these medications   furosemide 40 MG tablet Commonly known as:  LASIX   HYDROcodone-acetaminophen 5-325 MG tablet Commonly known as:  NORCO/VICODIN   isosorbide-hydrALAZINE 20-37.5 MG tablet Commonly known as:  BIDIL   olmesartan 20 MG tablet Commonly known as:  BENICAR   spironolactone 25 MG tablet Commonly known as:  ALDACTONE     TAKE these medications   acetaminophen 325 MG tablet Commonly known as:  TYLENOL Take 325 mg by mouth every 6 (six) hours as needed for mild pain.   aspirin 325 MG EC tablet Take 1 tablet (325 mg total) by mouth daily.   carvedilol 25 MG tablet Commonly known as:  COREG Take 25 mg by mouth 2 (two) times daily with a meal.   multivitamin with minerals Tabs tablet Take 1 tablet by mouth at bedtime.   oxyCODONE 5 MG immediate release tablet Commonly known as:  Oxy IR/ROXICODONE Take 1 tablet (5 mg total) by mouth every 6 (six) hours as needed for severe pain.   rosuvastatin 10 MG tablet Commonly known as:  CRESTOR Take 1 tablet (10 mg total) by mouth daily.   SYSTANE OP Place 1-2 drops into both eyes 3  (three) times daily as needed (for dry eyes.).      Follow-up Information    Gaye Pollack, MD Follow up.   Specialty:  Cardiothoracic Surgery Why:  see discharge paperwork for appointments. On the date of appointment to see Dr. Cyndia Bent please obtain a chest x-ray [facility] one half hour prior to the appointment. [facility] is located in the same office complex. Contact information: [address] [place] [address] [phone]        Lauree Chandler, MD Follow up.   Specialty:  Cardiology Why:  see discharge paperwork for appointment with cardiology Contact information: [place]. [address]  [address] [phone]          The patient has been discharged on:   1.Beta Blocker:  Yes [  y ]                              No   [   ]  If No, reason:  2.Ace Inhibitor/ARB: Yes [   ]                                     No  [ n   ]                                     If No, reason:low BP  3.Statin:   Yes [ y  ]                  No  [   ]                  If No, reason:  4.Ecasa:  Yes  Blue.Reese   ]                  No   [   ]                  If No, reason:  Signed: Sharlynn Seckinger E 11/22/2016, 7:48 AM

## 2016-11-22 MED ORDER — ASPIRIN 325 MG PO TBEC: 325.0000 mg | DELAYED_RELEASE_TABLET | Freq: Every day | ORAL | Status: DC

## 2016-11-22 MED ORDER — OXYCODONE HCL 5 MG PO TABS: 5.0000 mg | ORAL_TABLET | Freq: Four times a day (QID) | ORAL | 0 refills | Status: DC | PRN

## 2016-11-22 NOTE — Progress Notes (Signed)
CARDIAC REHAB PHASE I   Pt has ambulated twice independently this morning with no complaints, declines additional ambulation at this time. Cardiac surgery discharge education completed. Reviewed IS, sternal precautions, activity progression, exercise, heart health diety, sodium restrictions, daily weights and phase 2 cardiac rehab. Pt verbalized understanding, very receptive. Pt agrees to phase 2 cardiac rehab referral, will send to [facility] per pt request. Pt in bed, call bell within reach.   [id number] Lenna Sciara, RN, BSN 11/22/2016 9:48 AM

## 2016-11-22 NOTE — Progress Notes (Signed)
Patient ambulated in hallway independently with walker per patient report will monitor patient. Hayley Horn, Bettina Gavia RN

## 2016-11-22 NOTE — Progress Notes (Signed)
Patient given discharge instructions medication list and paper prescriptions. Patient questions were answered. And IV and tele dcd. Will discharge home as ordered. Mory Herrman, Bettina Gavia rN

## 2016-11-22 NOTE — Care Management Note (Signed)
Case Management Note Previous CM note initiated by Tiffany Mayo, RN--11/20/2016, 11:26 AM    Patient Details  Name: [name] MRN: [id number] Date of Birth: June 19, 1956  Subjective/Objective:     S/p AVR               Action/Plan:   Pt alert and oriented with nieces at bedside during assessment.  PTA pt was home alone independent.  Pt and family voiced concerns regarding pt returning home without 24/7 supervision post discharge - family are in the process of trying to arrange coverage but has not been able to firm up a plan.  CM informed both pt and family that PT will be ordered when medically stable (pt is currently on pressor) for evaluation to help determine appropriate discharge plan.  CM requested PT order via bedside nurse and physician sticky tab.  CM will continue to follow for discharge needs  2/26 [place] RN, BSN-  S/p AVR replacement,  Alert and oriented, patient states [name] will be with her for one week after she discharges home.  Per pt eval no pt f/u needed, she will need  Cardiac Rehab which will be set up at the cardiologist office at her follow up apt.  This was explained to patient.  She states she does not want a rolling walker. She has a PCP Dr. Blanchard [name] at [place], she has medication coverage and she will have transportation at discharge. NCM will cont to follow for dc needs.                  Action/Plan:   Expected Discharge Date:  11/22/16               Expected Discharge Plan:  Home/Self Care  In-House Referral:  Clinical Social Work  Discharge planning Services  CM Consult  Post Acute Care Choice:    Choice offered to:     DME Arranged:    DME Agency:     HH Arranged:    [place] Agency:     Status of Service:  Completed, signed off  If discussed at [employer] of Stay Meetings, dates discussed:  2/27  Discharge Disposition: home/self care   Additional Comments:  11/22/16- 1020- Tiffany Pooley RN, CM- pt for  d/c home today- has made arrangements for friend to stay with her, no HH or DME needs noted for discharge.   Tiffany Client [place], RN 11/22/2016, 10:25 AM [phone]

## 2016-11-22 NOTE — Progress Notes (Signed)
Physical Therapy Treatment Patient Details Name: [name] MRN: [id number] DOB: Jul 07, 1956 Today's Date: 11/22/2016    History of Present Illness Patient is a 61 yo female admitted 11/16/16 with AS now s/p AVR on 11/16/16.    PMH:  NICM, pacemaker, CHF, LBBB    PT Comments    Patient is making good progress with PT.  From a mobility standpoint anticipate patient will be ready for DC home. Pt denies any questions or concerns following PT session.       Follow Up Recommendations  No PT follow up     Equipment Recommendations   (Pt reports she will not be using rw at home)    Recommendations for Other Services       Precautions / Restrictions Precautions Precautions: Sternal Restrictions Weight Bearing Restrictions: Yes (sternal precautions)    Mobility  Bed Mobility Overal bed mobility: Independent Bed Mobility: Supine to Sit     Supine to sit: Independent     General bed mobility comments: HOB elevated approx. 30 degrees.   Transfers Overall transfer level: Needs assistance Equipment used: None Transfers: Sit to/from Stand Sit to Stand: Supervision            Ambulation/Gait Ambulation/Gait assistance: Supervision Ambulation Distance (Feet): 300 Feet Assistive device: None Gait Pattern/deviations: Step-through pattern Gait velocity: decreased   General Gait Details: good stability, no loss of balance   Stairs Stairs: Yes   Stair Management: One rail Left;Sideways Number of Stairs: 12 General stair comments: pt reports feeling confident with her steps at home  Wheelchair Mobility    Modified Rankin (Stroke Patients Only)       Balance Overall balance assessment: Independent Sitting-balance support: No upper extremity supported Sitting balance-Leahy Scale: Good     Standing balance support: No upper extremity supported Standing balance-Leahy Scale: Good                      Cognition Arousal/Alertness:  Awake/alert Behavior During Therapy: WFL for tasks assessed/performed Overall Cognitive Status: Within Functional Limits for tasks assessed                      Exercises      General Comments        Pertinent Vitals/Pain Pain Assessment: No/denies pain    Home Living                      Prior Function            PT Goals (current goals can now be found in the care plan section) Acute Rehab PT Goals Patient Stated Goal: home PT Goal Formulation: With patient Time For Goal Achievement: 11/25/16 Potential to Achieve Goals: Good Progress towards PT goals: Progressing toward goals    Frequency    Min 3X/week      PT Plan Current plan remains appropriate    Co-evaluation             End of Session   Activity Tolerance: Patient tolerated treatment well Patient left: in chair;with call bell/phone within reach Nurse Communication: Mobility status PT Visit Diagnosis: Other abnormalities of gait and mobility (R26.89)     Time: 3546-5681 PT Time Calculation (min) (ACUTE ONLY): 13 min  Charges:  $Gait Training: 8-22 mins                    G Codes:       Cassell Clement, PT,  CSCS Pager [phone] Office [phone]  11/22/2016, 1:17 PM

## 2016-11-22 NOTE — Progress Notes (Signed)
      [address]       [place] [address]             [phone]      6 Days Post-Op Procedure(s) (LRB): AORTIC VALVE REPLACEMENT (AVR) (N/A) TRANSESOPHAGEAL ECHOCARDIOGRAM (TEE) (N/A) Subjective: Feels well, no new issues  Objective: Vital signs in last 24 hours: Temp:  [97.7 F (36.5 C)-98.3 F (36.8 C)] 98.3 F (36.8 C) (02/28 0432) Pulse Rate:  [63-78] 63 (02/28 0432) Cardiac Rhythm: Ventricular paced (02/28 0701) Resp:  [18] 18 (02/28 0432) BP: (100-159)/(66-81) 159/76 (02/28 0432) SpO2:  [99 %-100 %] 99 % (02/28 0432) Weight:  [128 lb 11.2 oz (58.4 kg)] 128 lb 11.2 oz (58.4 kg) (02/28 0432)  Hemodynamic parameters for last 24 hours:    Intake/Output from previous day: 02/27 0701 - 02/28 0700 In: 1680 [P.O.:1680] Out: -  Intake/Output this shift: No intake/output data recorded.  General appearance: alert, cooperative and no distress Heart: regular rate and rhythm Lungs: clear to auscultation bilaterally Abdomen: benign Extremities: no edema Wound: incis healing well  Lab Results:  Recent Labs  11/20/16 0244  WBC 7.0  HGB 12.1  HCT 35.3*  PLT 157   BMET:  Recent Labs  11/20/16 0244  NA 136  K 3.8  CL 105  CO2 21*  GLUCOSE 107*  BUN 11  CREATININE 0.78  CALCIUM 10.0    PT/INR: No results for input(s): LABPROT, INR in the last 72 hours. ABG    Component Value Date/Time   PHART 7.348 (L) 11/16/2016 2139   HCO3 20.3 11/16/2016 2139   TCO2 26 11/17/2016 1553   ACIDBASEDEF 5.0 (H) 11/16/2016 2139   O2SAT 95.0 11/16/2016 2139   CBG (last 3)  No results for input(s): GLUCAP in the last 72 hours.  Meds Scheduled Meds: . aspirin EC  325 mg Oral Daily  . carvedilol  25 mg Oral BID WC  . docusate sodium  200 mg Oral Daily  . enoxaparin (LOVENOX) injection  40 mg Subcutaneous QHS  . mouth rinse  15 mL Mouth Rinse BID  . multivitamin with minerals  1 tablet Oral QHS  . pantoprazole  40 mg Oral QAC breakfast  .  rosuvastatin  10 mg Oral Daily  . sodium chloride flush  3 mL Intravenous Q12H   Continuous Infusions: PRN Meds:.sodium chloride, acetaminophen, bisacodyl **OR** bisacodyl, Naphazoline-Glycerin, ondansetron **OR** ondansetron (ZOFRAN) IV, oxyCODONE, sodium chloride flush, traMADol  Xrays No results found.  Assessment/Plan: S/P Procedure(s) (LRB): AORTIC VALVE REPLACEMENT (AVR) (N/A) TRANSESOPHAGEAL ECHOCARDIOGRAM (TEE) (N/A)  1 doing well, BP well controlled , one aberrant reading systolic reading in 591'M 2 she has made arrangements for friend to be with her at home 3 stable for discharge   LOS: 6 days    Krisna Omar E 11/22/2016

## 2016-11-23 ENCOUNTER — Telehealth: Payer: Self-pay | Admitting: Surgical

## 2016-11-23 ENCOUNTER — Telehealth (HOSPITAL_COMMUNITY): Payer: Self-pay | Admitting: Family Medicine

## 2016-11-23 NOTE — Telephone Encounter (Signed)
Verified Loss adjuster, chartered through [employer],  Pt has [employer]  No Co-Pay, No Co-Insurance, No Deductible,  Out of Pocket $5900.00 Pt has met $41.98 Pt's responsibility is $5858.02. Reference # [id number].... KJ

## 2016-11-23 NOTE — Telephone Encounter (Signed)
      [address]       [place] [address]             [phone]     Telephone call attempted both home and mobile numbers. No response from either at this time.  Yehudis Monceaux E, PA-C

## 2016-11-28 ENCOUNTER — Ambulatory Visit (INDEPENDENT_AMBULATORY_CARE_PROVIDER_SITE_OTHER): Payer: Self-pay

## 2016-11-28 DIAGNOSIS — Z4802 Encounter for removal of sutures: Secondary | ICD-10-CM

## 2016-11-28 DIAGNOSIS — Z952 Presence of prosthetic heart valve: Secondary | ICD-10-CM

## 2016-11-28 NOTE — Progress Notes (Signed)
Removed 2 sutures form chest tube sites with no signs of infection and patient tolerated well.

## 2016-11-30 DIAGNOSIS — I5022 Chronic systolic (congestive) heart failure: Secondary | ICD-10-CM | POA: Diagnosis not present

## 2016-11-30 DIAGNOSIS — Z79899 Other long term (current) drug therapy: Secondary | ICD-10-CM | POA: Diagnosis not present

## 2016-11-30 DIAGNOSIS — Z952 Presence of prosthetic heart valve: Secondary | ICD-10-CM | POA: Diagnosis not present

## 2016-11-30 DIAGNOSIS — I10 Essential (primary) hypertension: Secondary | ICD-10-CM | POA: Diagnosis not present

## 2016-12-04 ENCOUNTER — Other Ambulatory Visit: Payer: Self-pay | Admitting: *Deleted

## 2016-12-04 NOTE — Patient Outreach (Addendum)
[facility]) Care Management  12/04/2016  OKEMA ROLLINSON 1956-03-23 [id number]  EMMI-Heart Failure referral from red dashboard for weight:  Telephone call to patient; left message on voice mail requesting call back.  Plan: Follow up.  Sherrin Daisy, RN BSN [place] Management Coordinator [facility] Care Management  [phone]

## 2016-12-05 ENCOUNTER — Other Ambulatory Visit: Payer: Self-pay | Admitting: *Deleted

## 2016-12-05 NOTE — Patient Outreach (Signed)
[place] [facility]) Care Management  12/05/2016  [name] [birth year] [id number]  EMMI-Heart failure dashboard referral for weight: Spoke with patient who gave HIPPA verification.  Voices that she has been weighing self daily and weight varies from 128-130. States no problems with swelling or shortness of breath. States she understands when she needs to report to MD as it relates to weight gain.  Voices her most recent hospital stay was planned for aortic valve replacement. States surgery went well and she is healing well. States no signs of infection at incision site. States she had hospital follow up with primary care provider -11/30/2016. States also has appointments with surgeon & cardiologist this month. No transportation problems. States taking medications as prescribed.  Voices that she has Educational information & does not need any more at this time. States would prefer to be called on mobile phone.  No further concerns at this time.  Plan: Advise care management assistant to advise EMMI-HF of preferred contact number. Close out this case.  Sherrin Daisy, RN BSN CCM Care Management Coordinator [facility] Care Management  [phone]  .

## 2016-12-08 ENCOUNTER — Telehealth: Payer: Self-pay | Admitting: Cardiovascular Disease

## 2016-12-08 NOTE — Telephone Encounter (Signed)
New Message      Pt bp was stopped when she was in hospital , but her bp is up again and would like to start her on Benicar again, is this ok

## 2016-12-08 NOTE — Telephone Encounter (Signed)
OK with me. cdm

## 2016-12-11 NOTE — Telephone Encounter (Signed)
I spoke with Danae Chen (medical assistant for Dr. Dorthy Cooler) and told her it was OK with Dr. Angelena Form for pt to resume Benicar.  Danae Chen reports their office will order this for pt

## 2016-12-11 NOTE — Telephone Encounter (Signed)
Follow Up:    Danae Chen says she is still waiting to hear about what to do about pt's Benicar. Please call her with this information asap please.

## 2016-12-12 ENCOUNTER — Other Ambulatory Visit: Payer: Self-pay | Admitting: Surgical

## 2016-12-12 ENCOUNTER — Encounter: Payer: Self-pay | Admitting: Cardiology

## 2016-12-12 ENCOUNTER — Ambulatory Visit (INDEPENDENT_AMBULATORY_CARE_PROVIDER_SITE_OTHER): Payer: Medicare HMO | Admitting: Cardiology

## 2016-12-12 VITALS — BP 142/98 | HR 72 | Ht 64.0 in | Wt 133.8 lb

## 2016-12-12 DIAGNOSIS — I10 Essential (primary) hypertension: Secondary | ICD-10-CM

## 2016-12-12 DIAGNOSIS — Z79899 Other long term (current) drug therapy: Secondary | ICD-10-CM | POA: Diagnosis not present

## 2016-12-12 DIAGNOSIS — Z952 Presence of prosthetic heart valve: Secondary | ICD-10-CM | POA: Diagnosis not present

## 2016-12-12 MED ORDER — OLMESARTAN MEDOXOMIL 20 MG PO TABS: 20.0000 mg | ORAL_TABLET | Freq: Every day | ORAL | 11 refills | Status: DC

## 2016-12-12 NOTE — Progress Notes (Addendum)
12/12/2016 [name]   [birth year]  [id number]  Primary Physician Lujean Amel, MD Primary Cardiologist: Dr. Angelena Form Electrophysiologist: Dr. Lovena Le   Reason for Visit/CC: [facility] f/u s/p Aortic Valve Replacement   HPI:  The patient is a 61 y/o AAF, followed by Dr. Angelena Form, with a h/o hypertension, hyperlipidemia, h/o nonischemic cardiomyopathy s/p BiV ICD, chronic LBBB and h/o severe aortic stenosis. She recently underwent elective AVR by Dr. Cyndia Bent on 11/16/16 for symptomatic stage D Aortic stenosis, utilizing a tissue valve. Of note, prior to surgery, she underwent a R/LHC per Dr. Angelena Form on 07/05/16. This showed no obstructive CAD in any of the major epicardial vessels. Her most recent 2D echo 9/17 showed an EF of 55-60%. EF measurement by TEE during her valve replacement surgery was 65-70% with grade II DD. Also of note, several of her BP meds were discontinued at time of discharge. Per discharge summary, she was instructed to stop Lasix, Bidil, Benicar and Spironolactone. BMP day of discharge showed normal renal function and K.   She presents to clinic today for f/u. She reports that she has done well. She denies any CP. No dyspnea. Her only issue has been higher BP. Her BP today is 142/98. She is on Coreg but no other BP meds as outlined above. She has f/u with Dr. Cyndia Bent on 12/20/16.    No outpatient prescriptions have been marked as taking for the 12/12/16 encounter (Appointment) with Consuelo Pandy, PA-C.   Allergies  Allergen Reactions  . No Known Allergies    Past Medical History:  Diagnosis Date  . Abnormal liver function tests   . AICD (automatic cardioverter/defibrillator) present    [employer]  . Anxiety   . Aortic stenosis   . Benign neoplasm of colon 04/19/2001   Hyperplastic  . Bicuspid aortic valve   . Cardiomyopathy secondary   . CHF (congestive heart failure) ([place])   . Coronary artery disease   . Depression   . Headache   .  Heart murmur   . HTN (hypertension)   . Hypercalcemia   . Hyperlipidemia   . S/P ICD (internal cardiac defibrillator) procedure    Family History  Problem Relation Age of Onset  . Pancreatic cancer Mother   . Colon cancer Maternal Aunt   . Bone cancer Brother   . Heart attack Brother   . Hypercalcemia Neg Hx    Past Surgical History:  Procedure Laterality Date  . AORTIC VALVE REPLACEMENT N/A 11/16/2016   Procedure: AORTIC VALVE REPLACEMENT (AVR);  Surgeon: Gaye Pollack, MD;  Location: [place];  Service: Open Heart Surgery;  Laterality: N/A;  58mm Perimount Magna Ease Pericardial Bioprosthesis  . CARDIAC CATHETERIZATION N/A 07/05/2016   Procedure: Right/Left Heart Cath and Coronary Angiography;  Surgeon: Burnell Blanks, MD;  Location: [place] [facility];  Service: Cardiovascular;  Laterality: N/A;  . CHOLECYSTECTOMY    . implantation of a Guidant biventricular ICD    . laparoscopic cholecystectomy with intraoperative cholangiogram    . TEE WITHOUT CARDIOVERSION N/A 11/16/2016   Procedure: TRANSESOPHAGEAL ECHOCARDIOGRAM (TEE);  Surgeon: Gaye Pollack, MD;  Location: [place];  Service: Open Heart Surgery;  Laterality: N/A;   Social History   Social History  . Marital status: Single    Spouse name: N/A  . Number of children: 1  . Years of education: N/A   Occupational History  . Therapist, art   .  Unemployed   Social History Main Topics  . Smoking  status: Former Smoker    Packs/day: 1.00    Years: 40.00    Types: Cigarettes    Quit date: 09/25/2010  . Smokeless tobacco: Never Used     Comment: none in last 15 days  . Alcohol use 0.0 oz/week     Comment: occasional  . Drug use: No  . Sexual activity: Not on file   Other Topics Concern  . Not on file   Social History Narrative  . No narrative on file     Review of Systems: General: negative for chills, fever, night sweats or weight changes.  Cardiovascular: negative for chest pain, dyspnea on exertion,  edema, orthopnea, palpitations, paroxysmal nocturnal dyspnea or shortness of breath Dermatological: negative for rash Respiratory: negative for cough or wheezing Urologic: negative for hematuria Abdominal: negative for nausea, vomiting, diarrhea, bright red blood per rectum, melena, or hematemesis Neurologic: negative for visual changes, syncope, or dizziness All other systems reviewed and are otherwise negative except as noted above.   Physical Exam:  There were no vitals taken for this visit.  General appearance: alert, cooperative and no distress Neck: no carotid bruit and no JVD Lungs: clear to auscultation bilaterally Heart: regular rate and rhythm, S1, S2 normal, no murmur, click, rub or gallop Extremities: extremities normal, atraumatic, no cyanosis or edema Pulses: 2+ and symmetric Skin: Skin color, texture, turgor normal. No rashes or lesions Neurologic: Grossly normal  EKG not performed   ASSESSMENT AND PLAN:   1. H/o Aortic Stenosis: s/p recent tissue AVR per Dr. Cyndia Bent 11/16/16. Doing well post operatively. No CP or dyspnea. I recommended cardiac rehab, once cleared by Dr. Cyndia Bent.   2. HTN: BP is elevated and has been since discharge. Continue Coreg. We will add back her Benicar 20 mg daily, which was discontinued time of d/c (hospital labs reviewed, Kidney function and K were both stable). F/u with pharmacy in HTN clinic in 2 weeks with repeat BMP. Consider restarting Bidil, Lasix or spironolactone if additional BP coverage is needed.   3. HLD: continue statin therapy with Crestor.   4. H/o NICM: resolved. Recent assessment of systolic showed normal LVEF  5. BiV ICD: implanted for problem # 4. Followed by Dr. Lovena Le.   PLAN  f/u in HTN clinic with clinic pharmacist in 2 weeks for repeat BP check/ further med titration. F/u with Dr. Angelena Form in 6 months.   [name] PA-C 12/12/2016 10:13 AM

## 2016-12-12 NOTE — Patient Instructions (Signed)
Medication Instructions:  Please start Benicar 20 mg a day. Continue all other medications as listed.  Labwork: Please have a BMP in 2 weeks when you return to the Hypertension Clinic.  Follow-Up: Follow up in 2 weeks in the Hypertension Clinic.  Follow up in 6 months with Dr. Angelena Form.  You will receive a letter in the mail 2 months before you are due.  Please call us when you receive this letter to schedule your follow up appointment.  If you need a refill on your cardiac medications before your next appointment, please call your pharmacy.  Thank you for choosing [facility]!!

## 2016-12-14 ENCOUNTER — Other Ambulatory Visit: Payer: Self-pay | Admitting: *Deleted

## 2016-12-14 NOTE — Patient Outreach (Signed)
[facility]) Care Management  12/14/2016  JYOTI HARJU 27-Jan-1956 [id number]  EMMI-HF referral for red dashboard on lost interest in things:  Telephone call to patient who voices that she has not actually lost interest in doing things. States she is frustrated because she is still restricted from driving till she sees her cardiologist next week. States she feels fine and has not had any episodes of faintness, weakness, chest pain or trouble breathing.  States she completed appointment 3/20 with specialist office. Currently has friend that transports her to MD appointments & other places as needed.   States she continues to take medications as prescribed by her doctors. Advised that she knows when to notify MD office for abnormal symptoms. Voices no questions or concerns at this time & that she is managing well.   EMMI call has been addressed.  Plan: Send to care management assistant to close out.   Sherrin Daisy, RN BSN [place] Management Coordinator [facility] Care Management  [phone]

## 2016-12-15 ENCOUNTER — Other Ambulatory Visit: Payer: Self-pay | Admitting: Surgery

## 2016-12-15 DIAGNOSIS — Z952 Presence of prosthetic heart valve: Secondary | ICD-10-CM

## 2016-12-19 ENCOUNTER — Other Ambulatory Visit: Payer: Self-pay | Admitting: *Deleted

## 2016-12-19 NOTE — Patient Outreach (Signed)
[facility]) Care Management  12/19/2016  [name] Feb 08, 1956 [id number]  EMMI_Heart Failure with Red dashboard on weight of 131 on day #24:  Telephone call to patient who was advised of reason for call.  Voices that weight on Sunday was 133, Monday-130 and Tuesday 131.  States is not having any difficulty breathing or any swelling in any parts of her body. States she is weighing daily and is aware of weight gain that she is to report to MD. Voices that she is taking medication consistently as prescribed by her doctors.  States she has appointment with her surgeon tomorrow & will attend. States she has transportation to appointment.  Voices that she continues to work on cutting back on smoking and is smoking only 4 per day.  No concerns voiced by patient today. EMMI dashboard has been addressed.  Plan: Send to care management assistant to close out.  Sherrin Daisy, RN BSN [place] Management Coordinator [facility] Care Management  [phone]

## 2016-12-20 ENCOUNTER — Ambulatory Visit
Admission: RE | Admit: 2016-12-20 | Discharge: 2016-12-20 | Disposition: A | Discharge status: Home / Self Care | Payer: Medicare HMO | Source: Ambulatory Visit | Attending: Surgery | Admitting: Surgery

## 2016-12-20 ENCOUNTER — Ambulatory Visit (INDEPENDENT_AMBULATORY_CARE_PROVIDER_SITE_OTHER): Payer: Self-pay | Admitting: Surgery

## 2016-12-20 ENCOUNTER — Encounter: Payer: Self-pay | Admitting: Surgery

## 2016-12-20 VITALS — BP 140/98 | HR 82 | Resp 16 | Ht 64.0 in | Wt 133.0 lb

## 2016-12-20 DIAGNOSIS — Z950 Presence of cardiac pacemaker: Secondary | ICD-10-CM | POA: Diagnosis not present

## 2016-12-20 DIAGNOSIS — I350 Nonrheumatic aortic (valve) stenosis: Secondary | ICD-10-CM

## 2016-12-20 DIAGNOSIS — Z952 Presence of prosthetic heart valve: Secondary | ICD-10-CM

## 2016-12-21 ENCOUNTER — Encounter: Payer: Self-pay | Admitting: Surgery

## 2016-12-21 NOTE — Progress Notes (Signed)
      HPI: Patient returns for routine postoperative follow-up having undergone AVR with a 19 mm Edwards Magna-Ease pericardial valve on 11/16/2016. The patient's early postoperative recovery while in the hospital was notable for an uncomplicated postop course. Since hospital discharge the patient reports that she is feeling well. She is walking without chest pain or shortness of breath and looking forward to starting cardiac rehab.   Current Outpatient Prescriptions  Medication Sig Dispense Refill  . acetaminophen (TYLENOL) 325 MG tablet Take 325 mg by mouth every 6 (six) hours as needed for mild pain.    Marland Kitchen aspirin EC 325 MG EC tablet Take 1 tablet (325 mg total) by mouth daily.    . carvedilol (COREG) 25 MG tablet Take 25 mg by mouth 2 (two) times daily with a meal.     . Multiple Vitamin (MULTIVITAMIN WITH MINERALS) TABS tablet Take 1 tablet by mouth at bedtime.    Marland Kitchen olmesartan (BENICAR) 20 MG tablet Take 1 tablet (20 mg total) by mouth daily. 30 tablet 11  . oxyCODONE (OXY IR/ROXICODONE) 5 MG immediate release tablet Take 1 tablet (5 mg total) by mouth every 6 (six) hours as needed for severe pain. 28 tablet 0  . Polyethyl Glycol-Propyl Glycol (SYSTANE OP) Place 1-2 drops into both eyes 3 (three) times daily as needed (for dry eyes.).    Marland Kitchen rosuvastatin (CRESTOR) 10 MG tablet Take 1 tablet (10 mg total) by mouth daily. 90 tablet 3   No current facility-administered medications for this visit.     Physical Exam: BP (!) 140/98 (BP Location: Right Arm, Patient Position: Sitting, Cuff Size: Normal)   Pulse 82   Resp 16   Ht 5\' 4"  (1.626 m)   Wt 133 lb (60.3 kg)   SpO2 99% Comment: ON RA  BMI 22.83 kg/m  She looks well. Lung exam is clear. Cardiac exam shows a regular rate and rhythm with normal heart sounds. Chest incision is healing well and sternum is stable. There is no peripheral edema.   Diagnostic Tests:  CLINICAL DATA:  Status post aortic valve  replacement  EXAM: CHEST  2 VIEW  COMPARISON:  11/20/2016  FINDINGS: Left AICD remains in place, unchanged. Changes of aortic valve replacement. Mild cardiomegaly. No confluent opacities or effusions. No pneumothorax. No acute bony abnormality.  IMPRESSION: Status post AVR.  No acute cardiopulmonary disease.   Electronically Signed   By: Rolm Baptise M.D.   On: 12/20/2016 11:58   Impression:  Overall I think she is doing very well. I encouraged her to continue walking. She is planning to participate in cardiac rehab. I told her that she could drive her car but should not lift anything heavier than 10 lbs for three months postop. I strongly encouraged her to quit smoking.   Plan:  She will continue to follow up with cardiology and her PCP and will contact me if she has any problems with her incisions.   Gaye Pollack, MD [facility] [phone]

## 2016-12-22 ENCOUNTER — Other Ambulatory Visit: Payer: Self-pay | Admitting: *Deleted

## 2016-12-22 NOTE — Patient Outreach (Signed)
[facility]) Care Management  12/22/2016  [name] Jun 24, 1956 [id number]  EMMI-Heart Failure referral for lost of interest in things they use to enjoy.  This has already been addressed with patient in previous calls. States she is currently restricted from driving post surgery until  released by surgeon. States she has not lost interest in things but is just frustrated because she is use to driving herself. States however she is able to get to any where she needs to go because she has someone that provides transportation.  Stated that she was not depressed.   Plan: Close out. Sent to case Psychologist, prison and probation services.  Sherrin Daisy, RN BSN [place] Management Coordinator [facility] Care Management  [phone]

## 2017-01-02 ENCOUNTER — Ambulatory Visit: Payer: Medicare HMO

## 2017-01-02 ENCOUNTER — Other Ambulatory Visit: Payer: Medicare HMO

## 2017-01-03 ENCOUNTER — Ambulatory Visit (INDEPENDENT_AMBULATORY_CARE_PROVIDER_SITE_OTHER): Payer: Medicare HMO | Admitting: Pharmacist

## 2017-01-03 ENCOUNTER — Ambulatory Visit (INDEPENDENT_AMBULATORY_CARE_PROVIDER_SITE_OTHER): Payer: Medicare HMO | Admitting: Internal Medicine

## 2017-01-03 ENCOUNTER — Other Ambulatory Visit: Payer: Medicare HMO | Admitting: *Deleted

## 2017-01-03 ENCOUNTER — Encounter: Payer: Self-pay | Admitting: Internal Medicine

## 2017-01-03 ENCOUNTER — Encounter: Payer: Self-pay | Admitting: Pharmacist

## 2017-01-03 VITALS — BP 182/112 | HR 76

## 2017-01-03 VITALS — BP 158/112 | HR 74 | Ht 64.0 in | Wt 135.0 lb

## 2017-01-03 DIAGNOSIS — Z95810 Presence of automatic (implantable) cardiac defibrillator: Secondary | ICD-10-CM | POA: Diagnosis not present

## 2017-01-03 DIAGNOSIS — Z79899 Other long term (current) drug therapy: Secondary | ICD-10-CM

## 2017-01-03 DIAGNOSIS — I472 Ventricular tachycardia, unspecified: Secondary | ICD-10-CM

## 2017-01-03 DIAGNOSIS — I10 Essential (primary) hypertension: Secondary | ICD-10-CM

## 2017-01-03 DIAGNOSIS — I5022 Chronic systolic (congestive) heart failure: Secondary | ICD-10-CM

## 2017-01-03 LAB — CUP PACEART INCLINIC DEVICE CHECK
Date Time Interrogation Session: 20180411201207
Implantable Lead Implant Date: 20060630
Implantable Lead Implant Date: 20060630
Implantable Lead Implant Date: 20060630
Implantable Lead Location: 753858
Implantable Lead Location: 753859
Implantable Lead Location: 753860
Implantable Lead Model: 157
Implantable Lead Model: 4086
Implantable Lead Model: 4543
Implantable Lead Serial Number: 119717
Implantable Lead Serial Number: 131677
Implantable Lead Serial Number: 223436
Implantable Pulse Generator Implant Date: 20120131
Pulse Gen Serial Number: 498164

## 2017-01-03 MED ORDER — OLMESARTAN MEDOXOMIL 40 MG PO TABS: 40.0000 mg | ORAL_TABLET | Freq: Every day | ORAL | 1 refills | Status: DC

## 2017-01-03 NOTE — Patient Instructions (Signed)
Return for a follow up appointment in 2-4 weeks  Check your blood pressure at home daily (if able) and keep record of the readings.  Take your BP meds as follows: INCREASE olmesartan to 40mg  daily (you may take 2 tablets of your current supply until you run out then start 1 tablet of higher strength from pharmacy)  Bring all of your meds, your BP cuff and your record of home blood pressures to your next appointment.  Exercise as you're able, try to walk approximately 30 minutes per day.  Keep salt intake to a minimum, especially watch canned and prepared boxed foods.  Eat more fresh fruits and vegetables and fewer canned items.  Avoid eating in fast food restaurants.    HOW TO TAKE YOUR BLOOD PRESSURE: . Rest 5 minutes before taking your blood pressure. .  Don't smoke or drink caffeinated beverages for at least 30 minutes before. . Take your blood pressure before (not after) you eat. . Sit comfortably with your back supported and both feet on the floor (don't cross your legs). . Elevate your arm to heart level on a table or a desk. . Use the proper sized cuff. It should fit smoothly and snugly around your bare upper arm. There should be enough room to slip a fingertip under the cuff. The bottom edge of the cuff should be 1 inch above the crease of the elbow. . Ideally, take 3 measurements at one sitting and record the average.

## 2017-01-03 NOTE — Patient Instructions (Signed)
Your physician recommends that you continue on your current medications as directed. Please refer to the Current Medication list given to you today.  Your physician wants you to follow-up in: [place] will receive a reminder letter in the mail two months in advance. If you don't receive a letter, please call our office to schedule the follow-up appointment.

## 2017-01-03 NOTE — Progress Notes (Signed)
HPI [name] returns today for followup. She is a very pleasant 61 year old woman with a history of chronic systolic heart failure and left bundle branch block, with normalization of her LV function after BiV Pacing as well as dyslipidemia. She is status post biventricular ICD implantation. In the interim, she has done well but has developed aortic stenosis and undgone valve replacement. She is now off of her anti-coagulation and she feels much better. She admits to some dietary indiscretion and has also had problems with her eye tearing up. Allergies  Allergen Reactions  . No Known Allergies      Current Outpatient Prescriptions  Medication Sig Dispense Refill  . acetaminophen (TYLENOL) 325 MG tablet Take 325 mg by mouth every 6 (six) hours as needed for mild pain.    Marland Kitchen aspirin EC 325 MG EC tablet Take 1 tablet (325 mg total) by mouth daily.    . carvedilol (COREG) 25 MG tablet Take 25 mg by mouth 2 (two) times daily with a meal.     . Multiple Vitamin (MULTIVITAMIN WITH MINERALS) TABS tablet Take 1 tablet by mouth at bedtime.    Marland Kitchen olmesartan (BENICAR) 20 MG tablet Take 1 tablet (20 mg total) by mouth daily. 30 tablet 11  . oxyCODONE (OXY IR/ROXICODONE) 5 MG immediate release tablet Take 1 tablet (5 mg total) by mouth every 6 (six) hours as needed for severe pain. 28 tablet 0  . Polyethyl Glycol-Propyl Glycol (SYSTANE OP) Place 1-2 drops into both eyes 3 (three) times daily as needed (for dry eyes.).    Marland Kitchen rosuvastatin (CRESTOR) 10 MG tablet Take 1 tablet (10 mg total) by mouth daily. 90 tablet 3   No current facility-administered medications for this visit.      Past Medical History:  Diagnosis Date  . Abnormal liver function tests   . AICD (automatic cardioverter/defibrillator) present    [employer]  . Anxiety   . Aortic stenosis   . Benign neoplasm of colon 04/19/2001   Hyperplastic  . Bicuspid aortic valve   . Cardiomyopathy secondary   . CHF (congestive heart  failure) ([place])   . Coronary artery disease   . Depression   . Headache   . Heart murmur   . HTN (hypertension)   . Hypercalcemia   . Hyperlipidemia   . S/P ICD (internal cardiac defibrillator) procedure     ROS:   All systems reviewed and negative except as noted in the HPI.   Past Surgical History:  Procedure Laterality Date  . AORTIC VALVE REPLACEMENT N/A 11/16/2016   Procedure: AORTIC VALVE REPLACEMENT (AVR);  Surgeon: Gaye Pollack, MD;  Location: [place];  Service: Open Heart Surgery;  Laterality: N/A;  40mm Perimount Magna Ease Pericardial Bioprosthesis  . CARDIAC CATHETERIZATION N/A 07/05/2016   Procedure: Right/Left Heart Cath and Coronary Angiography;  Surgeon: Burnell Blanks, MD;  Location: [place] [facility];  Service: Cardiovascular;  Laterality: N/A;  . CHOLECYSTECTOMY    . implantation of a Guidant biventricular ICD    . laparoscopic cholecystectomy with intraoperative cholangiogram    . TEE WITHOUT CARDIOVERSION N/A 11/16/2016   Procedure: TRANSESOPHAGEAL ECHOCARDIOGRAM (TEE);  Surgeon: Gaye Pollack, MD;  Location: [place];  Service: Open Heart Surgery;  Laterality: N/A;     Family History  Problem Relation Age of Onset  . Pancreatic cancer Mother   . Colon cancer Maternal Aunt   . Bone cancer Brother   . Heart attack Brother   . Hypercalcemia Neg  Hx      Social History   Social History  . Marital status: Single    Spouse name: N/A  . Number of children: 1  . Years of education: N/A   Occupational History  . Therapist, art   .  Unemployed   Social History Main Topics  . Smoking status: Former Smoker    Packs/day: 1.00    Years: 40.00    Types: Cigarettes    Quit date: 09/25/2010  . Smokeless tobacco: Never Used     Comment: none in last 15 days  . Alcohol use 0.0 oz/week     Comment: occasional  . Drug use: No  . Sexual activity: Not on file   Other Topics Concern  . Not on file   Social History Narrative  . No narrative on  file     BP (!) 158/112   Pulse 74   Ht 5\' 4"  (1.626 m)   Wt 135 lb (61.2 kg)   SpO2 98%   BMI 23.17 kg/m   Physical Exam:  Well appearing middle aged woman, NAD HEENT: Unremarkable Neck: 7 cm  JVD, no thyromegally Lungs:  Clear with no wheezes, rales, or rhonchi. Healed median sternotomy. HEART:  Regular rate rhythm, no systolic murmurs Abd:  soft, positive bowel sounds, no organomegally, no rebound, no guarding Ext:  2 plus pulses, no edema, no cyanosis, no clubbing Skin:  No rashes no nodules Neuro:  CN II through XII intact, motor grossly intact  ECG - normal sinus rhythm with biventricular pacing  DEVICE  Normal device function.  See PaceArt for details.   Assess/Plan: 1. Chronic systolic heart failure, now with normalization of her LV function - will continue her current meds for now. 2. Aortic stenosis - she is s/p surgery and doing very well. 3. ICD - she is s/p Biv ICD and her device is working normally.  4. NSVT - she has had some on interogation of her device which has been asymptomatic. Will follow.  Mikle Bosworth.D.

## 2017-01-03 NOTE — Progress Notes (Signed)
Patient ID: ARES TEGTMEYER                 DOB: 1955-12-21                      MRN: [id number]     HPI: [name] is a 61 y.o. female patient of Dr. Angelena Form, referred by Ellen Henri, PA who presents today for hypertension evaluation. PMH includes hypertension, hyperlipidemia, h/o nonischemic cardiomyopathy s/p BiV ICD, chronic LBBB and h/o severe aortic stenosis. She recently underwent elective AVR by Dr. Cyndia Bent on 11/16/16 for symptomatic stage D Aortic stenosis, utilizing a tissue valve. Of note, prior to surgery, she underwent a R/LHC per Dr. Angelena Form on 07/05/16. This showed no obstructive CAD in any of the major epicardial vessels. Her most recent 2D echo 9/17 showed an EF of 55-60%. Several of her blood pressure medications were discontinued after her procedure (lasix, Bidil, benicar, spironolactone).   She presents today and states that she has been lethargic since her hospitalization. She is excited about getting started with cardiac rehab. She states that she believes she had a headache with Bidil, but does not recall having any side effects to the other medications.   Current HTN meds:  Carvedilol 25mg  BID olmesartan 20mg  daily  BP goal: <130/80  Family History: Mom and dad with HTN.   Social History: Current smoker - 3-4 cigarettes today. No alcohol.   Diet: Most meals prepared from home. Cooks with salts but has been trying to avoid since surgery - did have a rotisserie chicken. Drinks 1.5 cups of coffee per day. Drinks green tea as well.   Exercise: No formal exercise.    Wt Readings from Last 3 Encounters:  01/03/17 135 lb (61.2 kg)  12/20/16 133 lb (60.3 kg)  12/12/16 133 lb 12.8 oz (60.7 kg)   BP Readings from Last 3 Encounters:  01/03/17 (!) 182/112  01/03/17 (!) 158/112  12/20/16 (!) 140/98   Pulse Readings from Last 3 Encounters:  01/03/17 76  01/03/17 74  12/20/16 82    Renal function: CrCl cannot be calculated (Patient's most recent lab result  is older than the maximum 21 days allowed.).  Past Medical History:  Diagnosis Date  . Abnormal liver function tests   . AICD (automatic cardioverter/defibrillator) present    [employer]  . Anxiety   . Aortic stenosis   . Benign neoplasm of colon 04/19/2001   Hyperplastic  . Bicuspid aortic valve   . Cardiomyopathy secondary   . CHF (congestive heart failure) ([place])   . Coronary artery disease   . Depression   . Headache   . Heart murmur   . HTN (hypertension)   . Hypercalcemia   . Hyperlipidemia   . S/P ICD (internal cardiac defibrillator) procedure     Current Outpatient Prescriptions on File Prior to Visit  Medication Sig Dispense Refill  . acetaminophen (TYLENOL) 325 MG tablet Take 325 mg by mouth every 6 (six) hours as needed for mild pain.    Marland Kitchen aspirin EC 325 MG EC tablet Take 1 tablet (325 mg total) by mouth daily.    . carvedilol (COREG) 25 MG tablet Take 25 mg by mouth 2 (two) times daily with a meal.     . Multiple Vitamin (MULTIVITAMIN WITH MINERALS) TABS tablet Take 1 tablet by mouth at bedtime.    Marland Kitchen oxyCODONE (OXY IR/ROXICODONE) 5 MG immediate release tablet Take 1 tablet (5 mg total) by mouth every  6 (six) hours as needed for severe pain. 28 tablet 0  . Polyethyl Glycol-Propyl Glycol (SYSTANE OP) Place 1-2 drops into both eyes 3 (three) times daily as needed (for dry eyes.).    Marland Kitchen rosuvastatin (CRESTOR) 10 MG tablet Take 1 tablet (10 mg total) by mouth daily. 90 tablet 3   No current facility-administered medications on file prior to visit.     Allergies  Allergen Reactions  . No Known Allergies     Blood pressure (!) 182/112, pulse 76.   Assessment/Plan: Hypertension: BP is above goal. Increase olmesartan to 40mg  daily. Discussed restarting Bidil and spironolactone but pt would prefer to remain off bidil due to headaches. She is agreeable to restart spironolactone at next visit if pressure still elevated. Follow up in 2 weeks.    Thank  you, Lelan Pons. Patterson [place], [facility]  01/03/2017 11:57 AM

## 2017-01-04 ENCOUNTER — Telehealth: Payer: Self-pay | Admitting: *Deleted

## 2017-01-04 LAB — BASIC METABOLIC PANEL
BUN/Creatinine Ratio: 18 (ref 12–28)
BUN: 15 mg/dL (ref 8–27)
CO2: 22 mmol/L (ref 18–29)
Calcium: 10.8 mg/dL — ABNORMAL HIGH (ref 8.7–10.3)
Chloride: 103 mmol/L (ref 96–106)
Creatinine, Ser: 0.82 mg/dL (ref 0.57–1.00)
GFR calc Af Amer: 89 mL/min/{1.73_m2} (ref >59)
GFR calc non Af Amer: 77 mL/min/{1.73_m2} (ref >59)
Glucose: 108 mg/dL — ABNORMAL HIGH (ref 65–99)
Potassium: 4.1 mmol/L (ref 3.5–5.2)
Sodium: 141 mmol/L (ref 134–144)

## 2017-01-04 NOTE — Telephone Encounter (Signed)
Left msg to call.

## 2017-01-04 NOTE — Telephone Encounter (Signed)
-----   Message from [place], [place] sent at 01/04/2017  1:24 PM EDT ----- Kidney function and electrolyte stable on Benicar

## 2017-01-08 ENCOUNTER — Other Ambulatory Visit: Payer: Self-pay | Admitting: *Deleted

## 2017-01-08 ENCOUNTER — Encounter: Payer: Self-pay | Admitting: *Deleted

## 2017-01-08 NOTE — Telephone Encounter (Signed)
Letter sent.

## 2017-01-08 NOTE — Patient Outreach (Signed)
[facility]) Care Management  01/08/2017  AHMAYA OSTERMILLER [birth year] [id number]  EMMi_Heart Failure referral for red dashboard -new swelling:  Telephone call to patient who advised that she only had small amount of swelling. States no difficulty breathing or other symptoms indicating a heart failure problem.   States she continues to weigh daily with weight  of 129-131 in last weeks. Voices that she has been released to drive & exercise ( looks forward to going to gym 3 times weekly for exercise classes).  States taking medications as instructed.   Answered questions regarding salt content in food (fresh, frozen, canned). Patient advised of importance of looking at labels regarding salt content. Voices understanding.   Patient voices no further concerns. States she does not need any further educational literature regarding heart failure of hypertension.   EMMI dashboard has been addressed.  Plan: Close case.  Sherrin Daisy, RN BSN [place] Management Coordinator [facility] Care Management  [phone]

## 2017-01-16 ENCOUNTER — Telehealth (HOSPITAL_COMMUNITY): Payer: Self-pay | Admitting: Family Medicine

## 2017-01-16 NOTE — Telephone Encounter (Signed)
Update: Verified Loss adjuster, chartered through [place], Pt has [employer] Medicare  No Co-Pay, No Co-Insurance, No Deductible,  Out of Pocket $5900.00 Pt has met [id number]  Reference # [id number].... KJ

## 2017-01-18 ENCOUNTER — Encounter (HOSPITAL_COMMUNITY)
Admission: RE | Admit: 2017-01-18 | Discharge: 2017-01-18 | Disposition: A | Discharge status: Home / Self Care | Payer: Medicare HMO | Source: Ambulatory Visit | Attending: Internal Medicine | Admitting: Internal Medicine

## 2017-01-18 DIAGNOSIS — Z952 Presence of prosthetic heart valve: Secondary | ICD-10-CM

## 2017-01-18 DIAGNOSIS — Z95810 Presence of automatic (implantable) cardiac defibrillator: Secondary | ICD-10-CM

## 2017-01-18 NOTE — Progress Notes (Signed)
Renee PA returned call.  Updated with this morning events.  Last bp 140/100. Pt denies any headache but admits to feeling bad since surgery and wonders if her bp elevation may the cause.  Renee advised per pharmacist note to restart Spironolactone 25 mg daily. Pt to see pharmacist in the the HTN clinic on Wednesday.  Pt will hold from starting rehab until seen in the HTN clinic and cleared to start.  Advised pt to look into getting a cuff to monitor home use.  Pt also strongly advised on completed tobacco cessation.  Pt verbalized understanding and would try to cut out smoking. Cherre Huger, BSN Cardiac and Training and development officer

## 2017-01-18 NOTE — Progress Notes (Signed)
Cardiac Rehab Medication Review by a Pharmacist  Does the patient  feel that his/her medications are working for him/her?  Yes  Has the patient been experiencing any side effects to the medications prescribed?  No  Does the patient measure his/her own blood pressure or blood glucose at home?  No  Does the patient have any problems obtaining medications due to transportation or finances?  No  Understanding of regimen: good Understanding of indications: good Potential of compliance: good    Pharmacist comments: 61 y/o F presents to cardiac rehab in good spirits and no distress. Pt states blood pressure still running high but does not check often at home. Counseled patient on the importance of checking blood pressure regularly. No other issues noted.    Angela Burke, PharmD, BCPS Pharmacy Resident Pager: [phone] 01/18/2017 8:30 AM

## 2017-01-18 NOTE — Progress Notes (Signed)
Pt in today for cardiac rehab orientation appointment.  Pt with elevated bp 160/110 bilateral arms.  Pt stated that she had not taken her morning medication for bp- Benicar and Carvedilol.  Pt indicated that she has them with her.  Advised pt to take her medication.  Pt given H20 and crackers. Pt bp rechecked after one hour.  Bp decreased to 140/100.  [employer] paged.  Given pager number for [place].  Paged [place]. Renee indicated she was in the ER and would call back in 10-15 minutes.  Await return call. Cherre Huger, BSN Cardiac and Training and development officer

## 2017-01-22 ENCOUNTER — Encounter (HOSPITAL_COMMUNITY): Payer: Medicare HMO

## 2017-01-22 ENCOUNTER — Encounter (HOSPITAL_COMMUNITY): Payer: Self-pay

## 2017-01-23 ENCOUNTER — Other Ambulatory Visit: Payer: Self-pay | Admitting: Family Medicine

## 2017-01-23 DIAGNOSIS — Z1231 Encounter for screening mammogram for malignant neoplasm of breast: Secondary | ICD-10-CM

## 2017-01-23 NOTE — Progress Notes (Signed)
Patient ID: [name]                 DOB: 01-23-1956                      MRN: [id number]     HPI: [name] is a 61 y.o. female patient of Dr. Angelena Form, referred by Ellen Henri, PA who presents today for hypertension follow up. PMH includes hypertension, hyperlipidemia, h/o nonischemic cardiomyopathy (ECHO 9/17 EF 55-60%) s/p BiV ICD, chronic LBBB and h/o severe aortic stenosis s/p AVR repair 11/16/2016. Several of her blood pressure medications were discontinued after her AVR (lasix, Bidil, benicar, spironolactone).   Pt presented 01/18/2017 for cardiac rehab with initial elevated BP 160/110 bilateral arms but had not taken morning BP medications. Pt took both carvedilol and olmesartan in cardiac rehab and BP decreased to 140/100. Cardiac rehab paged [place] PA who advised patient to restart spironolactone 25mg  daily. Pt sent home from cardiac rehab until completion of HTN clinic appointment today. At that time pt also strongly advised to complete tobacco cessation.   Patient reports that she had to call cardiac rehab to get a prescription for spironolactone 25mg  daily after the event noted above, but that she did in fact start that medication. She has been adherent on carvedilol 25mg  BID, olmesartan 40mg  daily, and spironolactone 25mg  daily. Today she reports some increasing fatigue, "fatness", and possible dizziness over the last couple of weeks. These symptoms are not exacerbated by time of day, exertion, or positional changes. Denies falls. She specifically talked about feeling overweight and requested [place] weigh her after the visit. Of note, weight remains stable.   Current HTN meds: Carvedilol 25mg  BID, olmesartan 40mg  daily, spironolactone 25mg  daily Previously tried: BiDil (headaches) BP goal: <130/80  Family History: Mom and dad with HTN.   Social History: Current smoker, 3-4 cigarettes today. No alcohol.   Diet: Most meals prepared from home. Cooks with salts but has been  trying to avoid since surgery - did have a rotisserie chicken. Drinks 1.5 cups of coffee per day. Drinks green tea as well.   Exercise: No formal exercise.   Home BP readings: Per patient, she has taken her BP twice at a local [facility]: 152/98 and 152/90s.  Wt Readings from Last 3 Encounters:  01/03/17 135 lb (61.2 kg)  12/20/16 133 lb (60.3 kg)  12/12/16 133 lb 12.8 oz (60.7 kg)   BP Readings from Last 3 Encounters:  01/03/17 (!) 182/112  01/03/17 (!) 158/112  12/20/16 (!) 140/98   Pulse Readings from Last 3 Encounters:  01/03/17 76  01/03/17 74  12/20/16 82   Renal function: CrCl cannot be calculated (Unknown ideal weight.).  Past Medical History:  Diagnosis Date  . Abnormal liver function tests   . AICD (automatic cardioverter/defibrillator) present    [employer]  . Anxiety   . Aortic stenosis   . Benign neoplasm of colon 04/19/2001   Hyperplastic  . Bicuspid aortic valve   . Cardiomyopathy secondary   . CHF (congestive heart failure) ([place])   . Coronary artery disease   . Depression   . Headache   . Heart murmur   . HTN (hypertension)   . Hypercalcemia   . Hyperlipidemia   . S/P ICD (internal cardiac defibrillator) procedure     Current Outpatient Prescriptions on File Prior to Visit  Medication Sig Dispense Refill  . acetaminophen (TYLENOL) 325 MG tablet Take 325 mg by mouth every  6 (six) hours as needed for mild pain.    Marland Kitchen aspirin EC 325 MG EC tablet Take 1 tablet (325 mg total) by mouth daily.    . carvedilol (COREG) 25 MG tablet Take 25 mg by mouth 2 (two) times daily with a meal.     . Multiple Vitamin (MULTIVITAMIN WITH MINERALS) TABS tablet Take 1 tablet by mouth at bedtime.    Marland Kitchen olmesartan (BENICAR) 40 MG tablet Take 1 tablet (40 mg total) by mouth daily. 90 tablet 1  . oxyCODONE (OXY IR/ROXICODONE) 5 MG immediate release tablet Take 1 tablet (5 mg total) by mouth every 6 (six) hours as needed for severe pain. 28 tablet 0  . Polyethyl  Glycol-Propyl Glycol (SYSTANE OP) Place 1-2 drops into both eyes 3 (three) times daily as needed (for dry eyes.).    Marland Kitchen rosuvastatin (CRESTOR) 10 MG tablet Take 1 tablet (10 mg total) by mouth daily. 90 tablet 3   No current facility-administered medications on file prior to visit.     Allergies  Allergen Reactions  . No Known Allergies    Assessment/Plan:  1. Hypertension - Patient currently at BP goal of <130/80. Slightly symptomatic with occasional dizziness the last couple of weeks, but this may be due to dramatic change in blood pressure over the last couple of weeks. Will route this note to Dr. Lovena Le as well given runs of NSVT at last EP visit to see if any additional follow up is warranted. Will follow up in HTN clinic in 4 weeks to ensure BP remains at goal and pt's symptoms are improving. Pt also advised to call clinic with any worsening symptoms.  Checking BMET today with addition of spironolactone ~1 week ago.  Ok to return to cardiac rehab from a HTN perspective, will also forward to Maurice Small, RN with cardiac rehab.  Cruz Condon, PharmD, [place] PGY2 Pharmacy Resident   Bonita Supple, PharmD, CPP, [place] [address], [place], [place] [address] Phone: [phone]; Fax: [phone]

## 2017-01-24 ENCOUNTER — Encounter (HOSPITAL_COMMUNITY): Payer: Medicare HMO

## 2017-01-24 ENCOUNTER — Ambulatory Visit (INDEPENDENT_AMBULATORY_CARE_PROVIDER_SITE_OTHER): Payer: Medicare HMO | Admitting: Pharmacist

## 2017-01-24 VITALS — BP 120/84 | HR 78 | Wt 134.0 lb

## 2017-01-24 DIAGNOSIS — I10 Essential (primary) hypertension: Secondary | ICD-10-CM

## 2017-01-24 MED ORDER — SPIRONOLACTONE 25 MG PO TABS: 25.0000 mg | ORAL_TABLET | Freq: Every day | ORAL | 3 refills | Status: DC

## 2017-01-24 NOTE — Patient Instructions (Signed)
It was great to see you today.  Please continue to take all of the medications as prescribed.  We are getting labs today and will call you if there are any issues.

## 2017-01-25 ENCOUNTER — Telehealth (HOSPITAL_COMMUNITY): Payer: Self-pay | Admitting: *Deleted

## 2017-01-25 LAB — BASIC METABOLIC PANEL
BUN/Creatinine Ratio: 15 (ref 12–28)
BUN: 14 mg/dL (ref 8–27)
CO2: 24 mmol/L (ref 18–29)
Calcium: 10.8 mg/dL — ABNORMAL HIGH (ref 8.7–10.3)
Chloride: 103 mmol/L (ref 96–106)
Creatinine, Ser: 0.95 mg/dL (ref 0.57–1.00)
GFR calc Af Amer: 74 mL/min/{1.73_m2} (ref >59)
GFR calc non Af Amer: 64 mL/min/{1.73_m2} (ref >59)
Glucose: 99 mg/dL (ref 65–99)
Potassium: 4.3 mmol/L (ref 3.5–5.2)
Sodium: 140 mmol/L (ref 134–144)

## 2017-01-26 ENCOUNTER — Encounter (HOSPITAL_COMMUNITY): Admission: RE | Admit: 2017-01-26 | Payer: Medicare HMO | Source: Ambulatory Visit

## 2017-01-26 DIAGNOSIS — H5210 Myopia, unspecified eye: Secondary | ICD-10-CM | POA: Diagnosis not present

## 2017-01-26 DIAGNOSIS — H25093 Other age-related incipient cataract, bilateral: Secondary | ICD-10-CM | POA: Diagnosis not present

## 2017-01-26 DIAGNOSIS — Z0100 Encounter for examination of eyes and vision without abnormal findings: Secondary | ICD-10-CM | POA: Diagnosis not present

## 2017-01-29 ENCOUNTER — Encounter (HOSPITAL_COMMUNITY)
Admission: RE | Admit: 2017-01-29 | Discharge: 2017-01-29 | Disposition: A | Discharge status: Home / Self Care | Payer: Medicare HMO | Source: Ambulatory Visit | Attending: Internal Medicine | Admitting: Internal Medicine

## 2017-01-29 ENCOUNTER — Encounter (HOSPITAL_COMMUNITY): Payer: Self-pay

## 2017-01-29 ENCOUNTER — Telehealth: Payer: Self-pay | Admitting: Pharmacist

## 2017-01-29 VITALS — Ht 63.5 in

## 2017-01-29 DIAGNOSIS — Z48812 Encounter for surgical aftercare following surgery on the circulatory system: Secondary | ICD-10-CM | POA: Insufficient documentation

## 2017-01-29 DIAGNOSIS — Z952 Presence of prosthetic heart valve: Secondary | ICD-10-CM | POA: Diagnosis not present

## 2017-01-29 DIAGNOSIS — Z95810 Presence of automatic (implantable) cardiac defibrillator: Secondary | ICD-10-CM | POA: Diagnosis not present

## 2017-01-29 NOTE — Telephone Encounter (Signed)
[name] from cardiac rehab called clinic - pt is currently at cardiac rehab and BP readings are elevated. First BP reading was 144/93. After walking, BP was up to 158/90. After resting, it was back down to 132/90. She smoked this morning before cardiac rehab as well. Cardiac rehab would like pt's systolic < 90 before she can begin. However, exercise is one of the best ways to help bring diastolic readings down.  Pt recalls home readings of 149/101, 147/99, and 128/87. Advised pt to increase her spironolactone to 50mg  daily and will f/u in clinic in 10 days for BP check and BMET.

## 2017-01-29 NOTE — Progress Notes (Signed)
Cardiac Individual Treatment Plan  Patient Details  Name: [name] MRN: [id number] Date of Birth: [birth year] Referring Provider:     [facility] from 01/29/2017 in [place]  Referring Provider  [name]      Initial Encounter Date:    [facility] from 01/29/2017 in [place]  Date  01/29/17  Referring Provider  [name]      Visit Diagnosis: 11/16/16 S/P AVR (aortic valve replacement)  ICD (implantable cardioverter-defibrillator) in place  Patient's Home Medications [name] Admission:  Current Outpatient Prescriptions:  .  acetaminophen (TYLENOL) 325 MG tablet, Take 325 mg by mouth every 6 (six) hours as needed for mild pain., Disp: , Rfl:  .  aspirin EC 325 MG EC tablet, Take 1 tablet (325 mg total) by mouth daily., Disp: , Rfl:  .  carvedilol (COREG) 25 MG tablet, Take 25 mg by mouth 2 (two) times daily with a meal. , Disp: , Rfl:  .  Multiple Vitamin (MULTIVITAMIN WITH MINERALS) TABS tablet, Take 1 tablet by mouth at bedtime., Disp: , Rfl:  .  olmesartan (BENICAR) 40 MG tablet, Take 1 tablet (40 mg total) by mouth daily., Disp: 90 tablet, Rfl: 1 .  Polyethyl Glycol-Propyl Glycol (SYSTANE OP), Place 1-2 drops into both eyes 3 (three) times daily as needed (for dry eyes.)., Disp: , Rfl:  .  rosuvastatin (CRESTOR) 10 MG tablet, Take 1 tablet (10 mg total) by mouth daily., Disp: 90 tablet, Rfl: 3 .  oxyCODONE (OXY IR/ROXICODONE) 5 MG immediate release tablet, Take 1 tablet (5 mg total) by mouth every 6 (six) hours as needed for severe pain. (Patient not taking: Reported [name] 01/29/2017), Disp: 28 tablet, Rfl: 0 .  spironolactone (ALDACTONE) 25 MG tablet, Take 2 tablets (50 mg total) by mouth daily., Disp: 90 tablet, Rfl: 3  Past Medical History: Past Medical History:  Diagnosis Date  . Abnormal liver function tests   . AICD (automatic cardioverter/defibrillator)  present    [employer]  . Anxiety   . Aortic stenosis   . Benign neoplasm of colon 04/19/2001   Hyperplastic  . Bicuspid aortic valve   . Cardiomyopathy secondary   . CHF (congestive heart failure) ([place])   . Coronary artery disease   . Depression   . Headache   . Heart murmur   . HTN (hypertension)   . Hypercalcemia   . Hyperlipidemia   . S/P ICD (internal cardiac defibrillator) procedure     Tobacco Use: History  Smoking Status  . Former Smoker  . Packs/day: 0.25  . Years: 40.00  . Types: Cigarettes  . Quit date: 09/25/2010  Smokeless Tobacco  . Never Used    Comment: patient says she smokes 4 cigarettes a day    Labs: Recent Review Flowsheet Data    Labs for ITP Cardiac and Pulmonary Rehab Latest Ref Rng & Units 11/16/2016 11/16/2016 11/16/2016 11/16/2016 11/17/2016   Hemoglobin A1c 4.8 - 5.6 % - - - - -   PHART 7.350 - 7.450 7.433 - 7.339(L) 7.348(L) -   PCO2ART 32.0 - 48.0 mmHg 29.9(L) - 37.7 36.6 -   HCO3 20.0 - 28.0 mmol/L 20.5 - 20.4 20.3 -   TCO2 0 - 100 mmol/L 21 24 22 21 26    ACIDBASEDEF 0.0 - 2.0 mmol/L 4.0(H) - 5.0(H) 5.0(H) -   O2SAT % 98.0 - 98.0 95.0 -  Capillary Blood Glucose: Lab Results  Component Value Date   GLUCAP 108 (H) 11/17/2016   GLUCAP 114 (H) 11/17/2016   GLUCAP 109 (H) 11/16/2016   GLUCAP 104 (H) 11/16/2016   GLUCAP 100 (H) 11/16/2016     Exercise Target Goals: Date: 01/29/17  Exercise Program Goal: Individual exercise prescription set with THRR, safety & activity barriers. Participant demonstrates ability to understand and report RPE using BORG scale, to self-measure pulse accurately, and to acknowledge the importance of the exercise prescription.  Exercise Prescription Goal: Starting with aerobic activity 30 plus minutes a day, 3 days per week for initial exercise prescription. Provide home exercise prescription and guidelines that participant acknowledges understanding prior to discharge.  Activity Barriers &  Risk Stratification:     Activity Barriers & Cardiac Risk Stratification - 01/18/17 0843      Activity Barriers & Cardiac Risk Stratification   Activity Barriers Deconditioning;Muscular Weakness   Cardiac Risk Stratification High      6 Minute Walk:     6 Minute Walk    Row Name 01/29/17 1155         6 Minute Walk   Phase Initial     Distance 1462 feet     Walk Time 6 minutes     # of Rest Breaks 0     MPH 2.8     METS 3.9     RPE 9     VO2 Peak 13.7     Symptoms No     Resting HR 78 bpm     Resting BP 144/90     Max Ex. HR 106 bpm     Max Ex. BP 150/90     2 Minute Post BP 132/90        Oxygen Initial Assessment:   Oxygen Re-Evaluation:   Oxygen Discharge (Final Oxygen Re-Evaluation):   Initial Exercise Prescription:     Initial Exercise Prescription - 01/29/17 1100      Date of Initial Exercise RX and Referring Provider   Date 01/29/17   Referring Provider [name]     Recumbant Bike   Level 1.5   Minutes 10   METs 3     NuStep   Level 2   SPM 85   Minutes 10   METs 2.1     Track   Laps 10   Minutes 10   METs 2.74     Prescription Details   Frequency (times per week) 5   Duration Progress to 45 minutes of aerobic exercise without signs/symptoms of physical distress     Intensity   THRR 40-80% of Max Heartrate 63-126   Ratings of Perceived Exertion 11-15   Perceived Dyspnea 0-4     Progression   Progression Continue to progress workloads to maintain intensity without signs/symptoms of physical distress.     Resistance Training   Training Prescription Yes   Weight 2   Reps 10-15      Perform Capillary Blood Glucose checks as needed.  Exercise Prescription Changes:   Exercise Comments:   Exercise Goals and Review:      Exercise Goals    Row Name 01/18/17 0843             Exercise Goals   Increase Physical Activity Yes       Intervention Provide advice, education, support and counseling about  physical activity/exercise needs.;Develop an individualized exercise prescription for aerobic and resistive training based [name] initial evaluation findings, risk stratification, comorbidities and  participant's personal goals.       Expected Outcomes Achievement of increased cardiorespiratory fitness and enhanced flexibility, muscular endurance and strength shown through measurements of functional capacity and personal statement of participant.       Increase Strength and Stamina Yes  increase energy and activity levels; increase muscle tone and flatten belly region       Intervention Provide advice, education, support and counseling about physical activity/exercise needs.;Develop an individualized exercise prescription for aerobic and resistive training based [name] initial evaluation findings, risk stratification, comorbidities and participant's personal goals.       Expected Outcomes Achievement of increased cardiorespiratory fitness and enhanced flexibility, muscular endurance and strength shown through measurements of functional capacity and personal statement of participant.          Exercise Goals Re-Evaluation :    Discharge Exercise Prescription (Final Exercise Prescription Changes):   Nutrition:  Target Goals: Understanding of nutrition guidelines, daily intake of sodium 1500mg , cholesterol 200mg , calories 30% from fat and 7% or less from saturated fats, daily to have 5 or more servings of fruits and vegetables.  Biometrics:     Pre Biometrics - 01/29/17 1205      Pre Biometrics   Height 5' 3.5" (1.613 m)       Nutrition Therapy Plan and Nutrition Goals:   Nutrition Discharge: Nutrition Scores:   Nutrition Goals Re-Evaluation:   Nutrition Goals Re-Evaluation:   Nutrition Goals Discharge (Final Nutrition Goals Re-Evaluation):   Psychosocial: Target Goals: Acknowledge presence or absence of significant depression and/or stress, maximize coping skills, provide positive  support system. Participant is able to verbalize types and ability to use techniques and skills needed for reducing stress and depression.  Initial Review & Psychosocial Screening:     Initial Psych Review & Screening - 01/29/17 1749      Initial Review   Current issues with History of Depression;Current Depression     Family Dynamics   Good Support System? Yes   Concerns Inappropriate over/under dependence [name] family/friends   Comments [name]     Barriers   Psychosocial barriers to participate in program The patient should benefit from training in stress management and relaxation.     Screening Interventions   Interventions Yes;To provide support and resources with identified psychosocial needs;Other (comment)   Comments Appointment made for [name] to follow up with her primary care physcian [name] tomoorw at 0800   Expected Outcomes Short Term goal: Utilizing psychosocial counselor, staff and physician to assist with identification of specific Stressors or current issues interfering with healing process. Setting desired goal for each stressor or current issue identified.;Long Term Goal: Stressors or current issues are controlled or eliminated.;Short Term goal: Identification and review with participant of any Quality of Life or Depression concerns found by scoring the questionnaire.;Long Term goal: The participant improves quality of Life and PHQ9 Scores as seen by post scores and/or verbalization of changes      Quality of Life Scores:     Quality of Life - 01/18/17 1442      Quality of Life Scores   Health/Function Pre 22.27 %   Socioeconomic Pre 20.75 %   Psych/Spiritual Pre 14.57 %   Family Pre 10.6 %   GLOBAL Pre 18.59 %      PHQ-9: Recent Review Flowsheet Data    Depression screen [facility] 2/9 01/29/2017 01/29/2017   Decreased Interest - 0   Down,  Depressed, Hopeless (No Data)  1   PHQ - 2 Score - 1      Interpretation of Total Score  Total Score Depression Severity:  1-4 = Minimal depression, 5-9 = Mild depression, 10-14 = Moderate depression, 15-19 = Moderately severe depression, 20-27 = Severe depression   Psychosocial Evaluation and Intervention:   Psychosocial Re-Evaluation:   Psychosocial Discharge (Final Psychosocial Re-Evaluation):   Vocational Rehabilitation: Provide vocational rehab assistance to qualifying candidates.   Vocational Rehab Evaluation & Intervention:     Vocational Rehab - 01/29/17 1746      Initial Vocational Rehab Evaluation & Intervention   Assessment shows need for Vocational Rehabilitation No  [name] and is not interested in vocational rehab at this time.      Education: Education Goals: Education classes will be provided [name] a weekly basis, covering required topics. Participant will state understanding/return demonstration of topics presented.  Learning Barriers/Preferences:     Learning Barriers/Preferences - 01/18/17 0842      Learning Barriers/Preferences   Learning Barriers Sight   Learning Preferences Skilled Demonstration      Education Topics: Count Your Pulse:  -Group instruction provided by verbal instruction, demonstration, patient participation and written materials to support subject.  Instructors address importance of being able to find your pulse and how to count your pulse when at home without a heart monitor.  Patients get hands [name] experience counting their pulse with staff help and individually.   Heart Attack, Angina, and Risk Factor Modification:  -Group instruction provided by verbal instruction, video, and written materials to support subject.  Instructors address signs and symptoms of angina and heart attacks.    Also discuss risk factors for heart disease and how to make changes to improve heart health risk factors.   Functional Fitness:  -Group instruction provided by verbal instruction,  demonstration, patient participation, and written materials to support subject.  Instructors address safety measures for doing things around the house.  Discuss how to get up and down off the floor, how to pick things up properly, how to safely get out of a chair without assistance, and balance training.   Meditation and Mindfulness:  -Group instruction provided by verbal instruction, patient participation, and written materials to support subject.  Instructor addresses importance of mindfulness and meditation practice to help reduce stress and improve awareness.  Instructor also leads participants through a meditation exercise.    Stretching for Flexibility and Mobility:  -Group instruction provided by verbal instruction, patient participation, and written materials to support subject.  Instructors lead participants through series of stretches that are designed to increase flexibility thus improving mobility.  These stretches are additional exercise for major muscle groups that are typically performed during regular warm up and cool down.   Hands Only CPR Anytime:  -Group instruction provided by verbal instruction, video, patient participation and written materials to support subject.  Instructors co-teach with AHA video for hands only CPR.  Participants get hands [name] experience with mannequins.   Nutrition I class: Heart Healthy Eating:  -Group instruction provided by PowerPoint slides, verbal discussion, and written materials to support subject matter. The instructor gives an explanation and review of the Therapeutic Lifestyle Changes diet recommendations, which includes a discussion [name] lipid goals, dietary fat, sodium, fiber, plant stanol/sterol esters, sugar, and the components of a well-balanced, healthy diet.   Nutrition II class: Lifestyle Skills:  -Group instruction provided by PowerPoint slides, verbal discussion, and written materials to support subject matter. The instructor gives  an  explanation and review of label reading, grocery shopping for heart health, heart healthy recipe modifications, and ways to make healthier choices when eating out.   Diabetes Question & Answer:  -Group instruction provided by PowerPoint slides, verbal discussion, and written materials to support subject matter. The instructor gives an explanation and review of diabetes co-morbidities, pre- and post-prandial blood glucose goals, pre-exercise blood glucose goals, signs, symptoms, and treatment of hypoglycemia and hyperglycemia, and foot care basics.   Diabetes Blitz:  -Group instruction provided by PowerPoint slides, verbal discussion, and written materials to support subject matter. The instructor gives an explanation and review of the physiology behind type 1 and type 2 diabetes, diabetes medications and rational behind using different medications, pre- and post-prandial blood glucose recommendations and Hemoglobin A1c goals, diabetes diet, and exercise including blood glucose guidelines for exercising safely.    Portion Distortion:  -Group instruction provided by PowerPoint slides, verbal discussion, written materials, and food models to support subject matter. The instructor gives an explanation of serving size versus portion size, changes in portions sizes over the last 20 years, and what consists of a serving from each food group.   Stress Management:  -Group instruction provided by verbal instruction, video, and written materials to support subject matter.  Instructors review role of stress in heart disease and how to cope with stress positively.     Exercising [name] Your Own:  -Group instruction provided by verbal instruction, power point, and written materials to support subject.  Instructors discuss benefits of exercise, components of exercise, frequency and intensity of exercise, and end points for exercise.  Also discuss use of nitroglycerin and activating EMS.  Review options of places to  exercise outside of rehab.  Review guidelines for sex with heart disease.   Cardiac Drugs I:  -Group instruction provided by verbal instruction and written materials to support subject.  Instructor reviews cardiac drug classes: antiplatelets, anticoagulants, beta blockers, and statins.  Instructor discusses reasons, side effects, and lifestyle considerations for each drug class.   Cardiac Drugs II:  -Group instruction provided by verbal instruction and written materials to support subject.  Instructor reviews cardiac drug classes: angiotensin converting enzyme inhibitors (ACE-I), angiotensin II receptor blockers (ARBs), nitrates, and calcium channel blockers.  Instructor discusses reasons, side effects, and lifestyle considerations for each drug class.   Anatomy and Physiology of the Circulatory System:  -Group instruction provided by verbal instruction, video, and written materials to support subject.  Reviews functional anatomy of heart, how it relates to various diagnoses, and what role the heart plays in the overall system.   Knowledge Questionnaire Score:     Knowledge Questionnaire Score - 01/18/17 1437      Knowledge Questionnaire Score   Pre Score 25/28      Core Components/Risk Factors/Patient Goals at Admission:     Personal Goals and Risk Factors at Admission - 01/29/17 1819      Core Components/Risk Factors/Patient Goals [name] Admission   Tobacco Cessation Yes   Intervention Assist the participant in steps to quit. Provide individualized education and counseling about committing to Tobacco Cessation, relapse prevention, and pharmacological support that can be provided by physician.;Advice worker, assist with locating and accessing local/national Quit Smoking programs, and support quit date choice.   Expected Outcomes Short Term: Will demonstrate readiness to quit, by selecting a quit date.;Short Term: Will quit all tobacco product use, adhering to prevention  of relapse plan.;Long Term: Complete abstinence from all tobacco products for at least  12 months from quit date.   Hypertension Yes   Intervention Provide education [name] lifestyle modifcations including regular physical activity/exercise, weight management, moderate sodium restriction and increased consumption of fresh fruit, vegetables, and low fat dairy, alcohol moderation, and smoking cessation.;Monitor prescription use compliance.   Expected Outcomes Short Term: Continued assessment and intervention until BP is < 140/79mm HG in hypertensive participants. < 130/59mm HG in hypertensive participants with diabetes, heart failure or chronic kidney disease.;Long Term: Maintenance of blood pressure at goal levels.   Stress Yes   Intervention Offer individual and/or small group education and counseling [name] adjustment to heart disease, stress management and health-related lifestyle change. Teach and support self-help strategies.;Refer participants experiencing significant psychosocial distress to appropriate mental health specialists for further evaluation and treatment. When possible, include family members and significant others in education/counseling sessions.   Expected Outcomes Short Term: Participant demonstrates changes in health-related behavior, relaxation and other stress management skills, ability to obtain effective social support, and compliance with psychotropic medications if prescribed.;Long Term: Emotional wellbeing is indicated by absence of clinically significant psychosocial distress or social isolation.   Personal Goal Other Yes   Personal Goal Patient will increase energy and activity levels.      Core Components/Risk Factors/Patient Goals Review:    Core Components/Risk Factors/Patient Goals at Discharge (Final Review):    ITP Comments:     ITP Comments    Row Name 01/18/17 0840           ITP Comments Medical Director, Tiffany. Fransico Him          Comments: [name] started  cardiac rehab today.  Pt tolerated light exercise without complaints  telemetry-Vpaced with underlying Sinus Rhythm, asymptomatic.  Medication list reconciled. Pt denies barriers to medicaiton compliance.  PSYCHOSOCIAL ASSESSMENT:  PHQ-1. [name] says she has been mildly depressed due to the passing of her brother and her sister in the past year.  [name] says she has an old prescription for Xanax from over a year ago. [name] office called. An appointment  Was made for tomorrow morning at 0845 to discuss her current state of depression  Pt enjoys sewing, reading and paintting.   Pt oriented to exercise equipment and routine.    Understanding verbalized.Jnaya's entry blood pressure was 140/90 with a resting heart rate of 78. Post exercise blood pressure 158/90 and then  132/90. [name] [place] RPH at [facility] office called and notified about today's elevated diastolic blood pressure. Jinny Blossom talked with [name] over the phone about her current medications and increasing her aldactone. Norma has an appointment for a blood pressure check at [facility] office [name] 02/08/2017. Torra plans to return to exercise next Friday, after her office follow up. Tiffany Sookdeo continues to smoke 4 cigarettes a day. Smoking cessation encouraged.Barnet Pall, RN,BSN 01/29/2017 6:20 PM

## 2017-01-30 DIAGNOSIS — R5383 Other fatigue: Secondary | ICD-10-CM | POA: Diagnosis not present

## 2017-01-30 DIAGNOSIS — I10 Essential (primary) hypertension: Secondary | ICD-10-CM | POA: Diagnosis not present

## 2017-01-31 ENCOUNTER — Encounter (HOSPITAL_COMMUNITY): Admission: RE | Admit: 2017-01-31 | Payer: Medicare HMO | Source: Ambulatory Visit

## 2017-02-02 ENCOUNTER — Encounter (HOSPITAL_COMMUNITY): Payer: Medicare HMO

## 2017-02-02 DIAGNOSIS — I10 Essential (primary) hypertension: Secondary | ICD-10-CM | POA: Diagnosis not present

## 2017-02-05 ENCOUNTER — Encounter (HOSPITAL_COMMUNITY): Payer: Medicare HMO

## 2017-02-07 ENCOUNTER — Encounter (HOSPITAL_COMMUNITY): Payer: Medicare HMO

## 2017-02-08 ENCOUNTER — Encounter: Payer: Self-pay | Admitting: Pharmacist

## 2017-02-08 ENCOUNTER — Ambulatory Visit (INDEPENDENT_AMBULATORY_CARE_PROVIDER_SITE_OTHER): Payer: Medicare HMO | Admitting: Pharmacist

## 2017-02-08 ENCOUNTER — Ambulatory Visit (INDEPENDENT_AMBULATORY_CARE_PROVIDER_SITE_OTHER): Payer: Medicare HMO | Admitting: *Deleted

## 2017-02-08 ENCOUNTER — Ambulatory Visit: Payer: Medicare HMO

## 2017-02-08 VITALS — BP 98/68 | HR 57

## 2017-02-08 DIAGNOSIS — I10 Essential (primary) hypertension: Secondary | ICD-10-CM | POA: Diagnosis not present

## 2017-02-08 DIAGNOSIS — I5022 Chronic systolic (congestive) heart failure: Secondary | ICD-10-CM

## 2017-02-08 DIAGNOSIS — I472 Ventricular tachycardia, unspecified: Secondary | ICD-10-CM

## 2017-02-08 LAB — CUP PACEART INCLINIC DEVICE CHECK
Date Time Interrogation Session: 20180517040000
HighPow Impedance: 38 Ohm
HighPow Impedance: 58 Ohm
Implantable Lead Implant Date: 20060630
Implantable Lead Implant Date: 20060630
Implantable Lead Implant Date: 20060630
Implantable Lead Location: 753858
Implantable Lead Location: 753859
Implantable Lead Location: 753860
Implantable Lead Model: 157
Implantable Lead Model: 4086
Implantable Lead Model: 4543
Implantable Lead Serial Number: 119717
Implantable Lead Serial Number: 131677
Implantable Lead Serial Number: 223436
Implantable Pulse Generator Implant Date: 20120131
Lead Channel Impedance Value: 1363 Ohm
Lead Channel Impedance Value: 452 Ohm
Lead Channel Impedance Value: 545 Ohm
Lead Channel Pacing Threshold Amplitude: 1 V
Lead Channel Pacing Threshold Amplitude: 1.2 V
Lead Channel Pacing Threshold Amplitude: 1.2 V
Lead Channel Pacing Threshold Pulse Width: 0.4 ms
Lead Channel Pacing Threshold Pulse Width: 0.4 ms
Lead Channel Pacing Threshold Pulse Width: 0.4 ms
Lead Channel Sensing Intrinsic Amplitude: 18.3 mV
Lead Channel Sensing Intrinsic Amplitude: 19.3 mV
Lead Channel Sensing Intrinsic Amplitude: 2.1 mV
Lead Channel Setting Pacing Amplitude: 2 V
Lead Channel Setting Pacing Amplitude: 2.2 V
Lead Channel Setting Pacing Amplitude: 2.4 V
Lead Channel Setting Pacing Pulse Width: 0.4 ms
Lead Channel Setting Pacing Pulse Width: 0.4 ms
Lead Channel Setting Sensing Sensitivity: 0.5 mV
Lead Channel Setting Sensing Sensitivity: 1 mV
Pulse Gen Serial Number: 498164

## 2017-02-08 NOTE — Progress Notes (Signed)
CRT-D device check in office for ?low HRs. Thresholds and sensing consistent with previous device measurements. Lead impedance trends stable over time. No mode switch episodes recorded. (2) VT-NS episodes recorded. Patient bi-ventricularly pacing 100% of the time. Histogram distribution appropriate for patient and activity level. HRs have been >/=50bpm per histogram. HR was 73bpm w/PACs during interrogation today. Device programmed with appropriate safety margins. No changes made this session. Estimated longevity 4.5 years.  Patient enrolled in remote follow up. Patient will f/u as scheduled. Patient education completed including shock plan.

## 2017-02-08 NOTE — Progress Notes (Signed)
Patient ID: LAURALIE BLACKSHER                 DOB: Jan 10, 1956                      MRN: [id number]     HPI: ANAI LIPSON a 61 y.o.femalepatient ofDr. Angelena Form, referred by Ellen Henri, PAwho presents today for hypertension follow up.PMH includes hypertension, hyperlipidemia, h/o nonischemic cardiomyopathy (ECHO 9/17 EF 55-60%) s/p BiV ICD, chronic LBBB and h/o severe aortic stenosis s/p AVR repair 11/16/2016. Several of her blood pressure medications were discontinued after her AVR (lasix, Bidil, benicar, spironolactone).   At last OV on 01/24/2017, pt BP was at goal on carvedilol 25mg  BID, olmesartan 40mg  daily, and spironolactone 25mg  daily. In addition, she had complaints of increased fatigue, "fatness" and occasional dizziness. These symptoms were not exacerbated by time of day, exertion, or positional changes. Pt then seen in cardiac rehab 5 days later and BP was found to elevated at 144/93, but came down to 132/90 after resting. Cardiac rehab wanted DBP to be < 90 before continuing with rehab. Spoke to pt on the phone at cardiac rehab and she could recall some home readings of 149/101, 147/99, and 128/87. Spironolactone dose was increased to 50mg  daily. Pt presents for f/u BP check and BMET.  Pt presents with somewhat flat affect and complaints of fatigue. She stated that she had been feeling "different". She saw Dr. Raliegh Ip (cannot recall full name) on 02/02/17 and pt reports that he told her to stop taking spironolactone and he then instead started her on furosemide 40mg  daily due to swelling. She previously took this every other day. Upon checking her vitals, BP was found to be 90-100s/60s with a HR of 40 on three checks with pulse ox. Due to this we obtained an EKG and device interrogation. Pt said she hasn't felt dizzy or lightheaded, denied any near syncope.  EKG was read by DOD Dr. Radford Pax to show NSR with V-pacing - QTc 497, vent rate 76 bpm  Device interrogation showed NSR with lowest  rate in the 50s, mostly in the 70s  Follow up BP after was 98/68 with HR improved to 58.  Current HTN meds: Carvedilol 25mg  BID, olmesartan 40mg  daily, furosemide 40mg  daily Previously tried: BiDil (headaches) BP goal: <130/80 mmHg  Family History: Mom and dad with HTN.   Social History: Current smoker, 3-4 cigarettes today. No alcohol.   Diet: Most meals prepared from home. Cooks with salts but has been trying to avoid since surgery - did have a rotisserie chicken. Drinks 1.5 cups of coffee per day. Drinks green tea as well.   Exercise: No formal exercise.   Home BP readings: Per patient, 152/94 on 02/03/2017 > after this high reading, SBPs ranged from 90-120 and HR 70-87.  Wt Readings from Last 3 Encounters:  01/24/17 134 lb (60.8 kg)  01/03/17 135 lb (61.2 kg)  12/20/16 133 lb (60.3 kg)   BP Readings from Last 3 Encounters:  01/24/17 120/84  01/03/17 (!) 182/112  01/03/17 (!) 158/112   Pulse Readings from Last 3 Encounters:  01/24/17 78  01/03/17 76  01/03/17 74    Renal function: CrCl cannot be calculated (Unknown ideal weight.).  Past Medical History:  Diagnosis Date  . Abnormal liver function tests   . AICD (automatic cardioverter/defibrillator) present    [employer]  . Anxiety   . Aortic stenosis   . Benign neoplasm of colon 04/19/2001  Hyperplastic  . Bicuspid aortic valve   . Cardiomyopathy secondary   . CHF (congestive heart failure) ([place])   . Coronary artery disease   . Depression   . Headache   . Heart murmur   . HTN (hypertension)   . Hypercalcemia   . Hyperlipidemia   . S/P ICD (internal cardiac defibrillator) procedure     Current Outpatient Prescriptions on File Prior to Visit  Medication Sig Dispense Refill  . acetaminophen (TYLENOL) 325 MG tablet Take 325 mg by mouth every 6 (six) hours as needed for mild pain.    Marland Kitchen aspirin EC 325 MG EC tablet Take 1 tablet (325 mg total) by mouth daily.    . carvedilol (COREG) 25 MG  tablet Take 25 mg by mouth 2 (two) times daily with a meal.     . Multiple Vitamin (MULTIVITAMIN WITH MINERALS) TABS tablet Take 1 tablet by mouth at bedtime.    Marland Kitchen olmesartan (BENICAR) 40 MG tablet Take 1 tablet (40 mg total) by mouth daily. 90 tablet 1  . oxyCODONE (OXY IR/ROXICODONE) 5 MG immediate release tablet Take 1 tablet (5 mg total) by mouth every 6 (six) hours as needed for severe pain. (Patient not taking: Reported on 01/29/2017) 28 tablet 0  . Polyethyl Glycol-Propyl Glycol (SYSTANE OP) Place 1-2 drops into both eyes 3 (three) times daily as needed (for dry eyes.).    Marland Kitchen rosuvastatin (CRESTOR) 10 MG tablet Take 1 tablet (10 mg total) by mouth daily. 90 tablet 3  . spironolactone (ALDACTONE) 25 MG tablet Take 2 tablets (50 mg total) by mouth daily. 90 tablet 3   No current facility-administered medications on file prior to visit.     Allergies  Allergen Reactions  . No Known Allergies      Assessment/Plan:  1. Hypertension - Pt's BP surprisingly low at follow up visit today at 98/68 with HR in the 40s. Previously, BP had been elevated to 150/90s and pt was unable to start cardiac rehab. Only notable change since then was pt's PCP stopping her spironolactone and starting Lasix 1 ago. Since then, her BPs have fluctuated with many 90s/60s. EKG checked and device interrogated due to low BP, HR, and complaints of fatigue. No notable findings with HR primarily in the 70s on device. Due to hypotension, will decrease Benicar to 20mg  daily and change Lasix from 40mg  daily to 40mg  every other day. Per pt, this is the dose that she used to be on. Checking BMET today. Pt advised to monitor BP closely at home and to call us if there are consistent low readings. She is supposed to follow up with PCP tomorrow so we printed her an additional handout of all changes to take to him.  Pt had planned to attend cardiac rehab tomorrow. If she is up for it and cleared by PCP then she is okay to attend.    Follow up in 10 days for BP check.  Cruz Condon, PharmD, [place] PGY2 Pharmacy Resident  Kingwood Supple, PharmD, CPP, [place] [address], [place], [place] [address] Phone: [phone]; Fax: [phone]

## 2017-02-08 NOTE — Patient Instructions (Addendum)
Cut back Benicar (olmesartan) to 20mg  daily (1/2 tablet)  Decrease your furosemide to 40mg  every other day  Continue taking carvedilol 25mg  twice a day  We will call with lab results  Keep checking your blood pressure at home - we would like your top number to be at least 110  Follow up in clinic in 10 days

## 2017-02-09 ENCOUNTER — Encounter (HOSPITAL_COMMUNITY): Payer: Medicare HMO

## 2017-02-09 DIAGNOSIS — I10 Essential (primary) hypertension: Secondary | ICD-10-CM | POA: Diagnosis not present

## 2017-02-09 LAB — BASIC METABOLIC PANEL
BUN/Creatinine Ratio: 30 — ABNORMAL HIGH (ref 12–28)
BUN: 28 mg/dL — ABNORMAL HIGH (ref 8–27)
CO2: 25 mmol/L (ref 18–29)
Calcium: 10.8 mg/dL — ABNORMAL HIGH (ref 8.7–10.3)
Chloride: 99 mmol/L (ref 96–106)
Creatinine, Ser: 0.94 mg/dL (ref 0.57–1.00)
GFR calc Af Amer: 75 mL/min/{1.73_m2} (ref >59)
GFR calc non Af Amer: 65 mL/min/{1.73_m2} (ref >59)
Glucose: 110 mg/dL — ABNORMAL HIGH (ref 65–99)
Potassium: 4.1 mmol/L (ref 3.5–5.2)
Sodium: 140 mmol/L (ref 134–144)

## 2017-02-12 ENCOUNTER — Telehealth: Payer: Self-pay | Admitting: Physician Assistant

## 2017-02-12 ENCOUNTER — Encounter (HOSPITAL_COMMUNITY)
Admission: RE | Admit: 2017-02-12 | Discharge: 2017-02-12 | Disposition: A | Discharge status: Home / Self Care | Payer: Medicare HMO | Source: Ambulatory Visit | Attending: Internal Medicine | Admitting: Internal Medicine

## 2017-02-12 ENCOUNTER — Telehealth: Payer: Self-pay | Admitting: Cardiovascular Disease

## 2017-02-12 DIAGNOSIS — Z95810 Presence of automatic (implantable) cardiac defibrillator: Secondary | ICD-10-CM

## 2017-02-12 DIAGNOSIS — Z952 Presence of prosthetic heart valve: Secondary | ICD-10-CM

## 2017-02-12 NOTE — Telephone Encounter (Signed)
     Called by cardiac rehab today given high DBPs ~100-110. She is feeling fine. Fuller Canada PharmD has seen pt three times in the clinic and every time her BP here has been at goal, most recently it was very low at 98/68. Every time she goes to cardiac rehab her BP readings are elevated and they won't let her exercise. I discussed case with Megan Supple and we both agree that exercise is the best way to treat diastolic CP. So the plan will be to let her exercise unless DBP >115. If we continue to have difficulties with labile pressures we may think about a 24 hour BP monitor. Otherwise she should continue to work with cardiac rehab and focus on diet and exercise. Discussed plan with patient and Charlene at cardiac rehab and we are all in agreement.   Angelena Form PA-C  [facility]

## 2017-02-12 NOTE — Progress Notes (Signed)
Incomplete Session Note  Patient Details  Name: KRYSTI HICKLING MRN: [id number] Date of Birth: 31-Mar-1956 Referring Provider:     CARDIAC REHAB PHASE II EXERCISE from 01/29/2017 in [place]  Referring Provider  Dr Darlina Guys      [name] did not complete her rehab session.  Entry blood pressure 120/110 with a resting heart rate of 71. Koraima did smoke a cigarette prior to comingTo exercise at cardiac. Satara says her blood pressure was high when she checked it at home this morning and over the weekend. [facility] called and notified. Joellen Jersey said she will make an appointment for Kylieann to follow up at Dr Camillia Herter office in the near future. Recheck blood pressure 118/100 upon exit from cardiac rehab. Sila did not exercise today. Velna had no complaints today.

## 2017-02-12 NOTE — Addendum Note (Signed)
Addended by: Aris Georgia, Agnieszka Newhouse L on: 02/12/2017 12:48 PM   Modules accepted: Orders

## 2017-02-12 NOTE — Telephone Encounter (Signed)
-----   Message from Eileen Stanford, PA-C sent at 02/12/2017 11:00 AM EDT ----- Called by cardiac rehab today bc BP running high. She is followed in HTN clinic by [facility] and has follow up in 10 days. Is there anyway we can get her in sooner with an extender or the BP clinic at [address] or [address]? Thanks! The patient will be waiting for a phone call from a scheduler

## 2017-02-12 NOTE — Telephone Encounter (Signed)
See additional phone note today, pt has been scheduled to see HTN Clinic 02/20/17 at 9:30 AM, appt for tomorrow cancelled, pt aware.

## 2017-02-12 NOTE — Telephone Encounter (Signed)
New message       Pt is seen in the bp clinic.  Patient came to cardiac rehab today with a bp of 120/110.  [name] want to talk to the pharmacist.  She will hold the patient at cardiac rehab pending a callback

## 2017-02-12 NOTE — Telephone Encounter (Signed)
I have rescheduled appt to 02/13/17 2:30 PM--LMTCB for pt to let her know appt has been changed.

## 2017-02-13 ENCOUNTER — Ambulatory Visit: Payer: Medicare HMO

## 2017-02-14 ENCOUNTER — Encounter (HOSPITAL_COMMUNITY)
Admission: RE | Admit: 2017-02-14 | Discharge: 2017-02-14 | Disposition: A | Discharge status: Home / Self Care | Payer: Medicare HMO | Source: Ambulatory Visit | Attending: Internal Medicine | Admitting: Internal Medicine

## 2017-02-14 DIAGNOSIS — Z48812 Encounter for surgical aftercare following surgery on the circulatory system: Secondary | ICD-10-CM | POA: Diagnosis not present

## 2017-02-14 DIAGNOSIS — Z952 Presence of prosthetic heart valve: Secondary | ICD-10-CM | POA: Diagnosis not present

## 2017-02-14 DIAGNOSIS — Z95810 Presence of automatic (implantable) cardiac defibrillator: Secondary | ICD-10-CM

## 2017-02-14 NOTE — Progress Notes (Signed)
Cardiac Individual Treatment Plan  Patient Details  Name: [name] MRN: [id number] Date of Birth: 1956/07/25 Referring Provider:     [facility] from 01/29/2017 in [place]  Referring Provider  Dr Darlina Guys      Initial Encounter Date:    [facility] from 01/29/2017 in [place]  Date  01/29/17  Referring Provider  Dr Darlina Guys      Visit Diagnosis: 11/16/16 S/P AVR (aortic valve replacement)  ICD (implantable cardioverter-defibrillator) in place  Patient's Home Medications on Admission:  Current Outpatient Prescriptions:  .  acetaminophen (TYLENOL) 325 MG tablet, Take 325 mg by mouth every 6 (six) hours as needed for mild pain., Disp: , Rfl:  .  aspirin EC 325 MG EC tablet, Take 1 tablet (325 mg total) by mouth daily., Disp: , Rfl:  .  carvedilol (COREG) 25 MG tablet, Take 25 mg by mouth 2 (two) times daily with a meal. , Disp: , Rfl:  .  furosemide (LASIX) 40 MG tablet, Take 40 mg by mouth every other day., Disp: , Rfl:  .  Multiple Vitamin (MULTIVITAMIN WITH MINERALS) TABS tablet, Take 1 tablet by mouth at bedtime., Disp: , Rfl:  .  olmesartan (BENICAR) 40 MG tablet, Take 0.5 tablets (20 mg total) by mouth daily., Disp: , Rfl:  .  Polyethyl Glycol-Propyl Glycol (SYSTANE OP), Place 1-2 drops into both eyes 3 (three) times daily as needed (for dry eyes.)., Disp: , Rfl:  .  rosuvastatin (CRESTOR) 10 MG tablet, Take 1 tablet (10 mg total) by mouth daily., Disp: 90 tablet, Rfl: 3  Past Medical History: Past Medical History:  Diagnosis Date  . Abnormal liver function tests   . AICD (automatic cardioverter/defibrillator) present    [employer]  . Anxiety   . Aortic stenosis   . Benign neoplasm of colon 04/19/2001   Hyperplastic  . Bicuspid aortic valve   . Cardiomyopathy secondary   . CHF (congestive heart failure) ([place])   . Coronary artery  disease   . Depression   . Headache   . Heart murmur   . HTN (hypertension)   . Hypercalcemia   . Hyperlipidemia   . S/P ICD (internal cardiac defibrillator) procedure     Tobacco Use: History  Smoking Status  . Former Smoker  . Packs/day: 0.25  . Years: 40.00  . Types: Cigarettes  . Quit date: 09/25/2010  Smokeless Tobacco  . Never Used    Comment: patient says she smokes 4 cigarettes a day    Labs: Recent Review Flowsheet Data    Labs for ITP Cardiac and Pulmonary Rehab Latest Ref Rng & Units 11/16/2016 11/16/2016 11/16/2016 11/16/2016 11/17/2016   Hemoglobin A1c 4.8 - 5.6 % - - - - -   PHART 7.350 - 7.450 7.433 - 7.339(L) 7.348(L) -   PCO2ART 32.0 - 48.0 mmHg 29.9(L) - 37.7 36.6 -   HCO3 20.0 - 28.0 mmol/L 20.5 - 20.4 20.3 -   TCO2 0 - 100 mmol/L 21 24 22 21 26    ACIDBASEDEF 0.0 - 2.0 mmol/L 4.0(H) - 5.0(H) 5.0(H) -   O2SAT % 98.0 - 98.0 95.0 -      Capillary Blood Glucose: Lab Results  Component Value Date   GLUCAP 108 (H) 11/17/2016   GLUCAP 114 (H) 11/17/2016   GLUCAP 109 (H) 11/16/2016   GLUCAP 104 (H) 11/16/2016   GLUCAP 100 (H) 11/16/2016  Exercise Target Goals:    Exercise Program Goal: Individual exercise prescription set with THRR, safety & activity barriers. Participant demonstrates ability to understand and report RPE using BORG scale, to self-measure pulse accurately, and to acknowledge the importance of the exercise prescription.  Exercise Prescription Goal: Starting with aerobic activity 30 plus minutes a day, 3 days per week for initial exercise prescription. Provide home exercise prescription and guidelines that participant acknowledges understanding prior to discharge.  Activity Barriers & Risk Stratification:     Activity Barriers & Cardiac Risk Stratification - 01/18/17 0843      Activity Barriers & Cardiac Risk Stratification   Activity Barriers Deconditioning;Muscular Weakness   Cardiac Risk Stratification High      6 Minute  Walk:     6 Minute Walk    Row Name 01/29/17 1155         6 Minute Walk   Phase Initial     Distance 1462 feet     Walk Time 6 minutes     # of Rest Breaks 0     MPH 2.8     METS 3.9     RPE 9     VO2 Peak 13.7     Symptoms No     Resting HR 78 bpm     Resting BP 144/90     Max Ex. HR 106 bpm     Max Ex. BP 150/90     2 Minute Post BP 132/90        Oxygen Initial Assessment:   Oxygen Re-Evaluation:   Oxygen Discharge (Final Oxygen Re-Evaluation):   Initial Exercise Prescription:     Initial Exercise Prescription - 01/29/17 1100      Date of Initial Exercise RX and Referring Provider   Date 01/29/17   Referring Provider Dr Darlina Guys     Recumbant Bike   Level 1.5   Minutes 10   METs 3     NuStep   Level 2   SPM 85   Minutes 10   METs 2.1     Track   Laps 10   Minutes 10   METs 2.74     Prescription Details   Frequency (times per week) 5   Duration Progress to 45 minutes of aerobic exercise without signs/symptoms of physical distress     Intensity   THRR 40-80% of Max Heartrate 63-126   Ratings of Perceived Exertion 11-15   Perceived Dyspnea 0-4     Progression   Progression Continue to progress workloads to maintain intensity without signs/symptoms of physical distress.     Resistance Training   Training Prescription Yes   Weight 2   Reps 10-15      Perform Capillary Blood Glucose checks as needed.  Exercise Prescription Changes:   Exercise Comments:      Exercise Comments    Row Name 02/14/17 1117           Exercise Comments Pt is off to a good start with exercise!          Exercise Goals and Review:      Exercise Goals    Row Name 01/18/17 0843             Exercise Goals   Increase Physical Activity Yes       Intervention Provide advice, education, support and counseling about physical activity/exercise needs.;Develop an individualized exercise prescription for aerobic and resistive training based on  initial evaluation findings, risk stratification, comorbidities and participant's  personal goals.       Expected Outcomes Achievement of increased cardiorespiratory fitness and enhanced flexibility, muscular endurance and strength shown through measurements of functional capacity and personal statement of participant.       Increase Strength and Stamina Yes  increase energy and activity levels; increase muscle tone and flatten belly region       Intervention Provide advice, education, support and counseling about physical activity/exercise needs.;Develop an individualized exercise prescription for aerobic and resistive training based on initial evaluation findings, risk stratification, comorbidities and participant's personal goals.       Expected Outcomes Achievement of increased cardiorespiratory fitness and enhanced flexibility, muscular endurance and strength shown through measurements of functional capacity and personal statement of participant.          Exercise Goals Re-Evaluation :    Discharge Exercise Prescription (Final Exercise Prescription Changes):   Nutrition:  Target Goals: Understanding of nutrition guidelines, daily intake of sodium 1500mg , cholesterol 200mg , calories 30% from fat and 7% or less from saturated fats, daily to have 5 or more servings of fruits and vegetables.  Biometrics:     Pre Biometrics - 01/29/17 1205      Pre Biometrics   Height 5' 3.5" (1.613 m)       Nutrition Therapy Plan and Nutrition Goals:   Nutrition Discharge: Nutrition Scores:   Nutrition Goals Re-Evaluation:   Nutrition Goals Re-Evaluation:   Nutrition Goals Discharge (Final Nutrition Goals Re-Evaluation):   Psychosocial: Target Goals: Acknowledge presence or absence of significant depression and/or stress, maximize coping skills, provide positive support system. Participant is able to verbalize types and ability to use techniques and skills needed for reducing stress  and depression.  Initial Review & Psychosocial Screening:     Initial Psych Review & Screening - 01/29/17 1749      Initial Review   Current issues with History of Depression;Current Depression     Family Dynamics   Good Support System? Yes   Concerns Inappropriate over/under dependence on family/friends   Comments Lachina has a an adult son and a friend she can rely on     Barriers   Psychosocial barriers to participate in program The patient should benefit from training in stress management and relaxation.     Screening Interventions   Interventions Yes;To provide support and resources with identified psychosocial needs;Other (comment)   Comments Appointment made for Ms Solum to follow up with her primary care physcian Dr Burnadette Pop tomoorw at 0800   Expected Outcomes Short Term goal: Utilizing psychosocial counselor, staff and physician to assist with identification of specific Stressors or current issues interfering with healing process. Setting desired goal for each stressor or current issue identified.;Long Term Goal: Stressors or current issues are controlled or eliminated.;Short Term goal: Identification and review with participant of any Quality of Life or Depression concerns found by scoring the questionnaire.;Long Term goal: The participant improves quality of Life and PHQ9 Scores as seen by post scores and/or verbalization of changes      Quality of Life Scores:     Quality of Life - 01/18/17 1442      Quality of Life Scores   Health/Function Pre 22.27 %   Socioeconomic Pre 20.75 %   Psych/Spiritual Pre 14.57 %   Family Pre 10.6 %   GLOBAL Pre 18.59 %      PHQ-9: Recent Review Flowsheet Data    Depression screen [facility] 2/9 01/29/2017 01/29/2017   Decreased Interest - 0   Down, Depressed,  Hopeless (No Data)  1   PHQ - 2 Score - 1     Interpretation of Total Score  Total Score Depression Severity:  1-4 = Minimal depression, 5-9 = Mild depression, 10-14 =  Moderate depression, 15-19 = Moderately severe depression, 20-27 = Severe depression   Psychosocial Evaluation and Intervention:   Psychosocial Re-Evaluation:   Psychosocial Discharge (Final Psychosocial Re-Evaluation):   Vocational Rehabilitation: Provide vocational rehab assistance to qualifying candidates.   Vocational Rehab Evaluation & Intervention:     Vocational Rehab - 01/29/17 1746      Initial Vocational Rehab Evaluation & Intervention   Assessment shows need for Vocational Rehabilitation No  Ms Nippert disabled and is not interested in vocational rehab at this time.      Education: Education Goals: Education classes will be provided on a weekly basis, covering required topics. Participant will state understanding/return demonstration of topics presented.  Learning Barriers/Preferences:     Learning Barriers/Preferences - 01/18/17 0842      Learning Barriers/Preferences   Learning Barriers Sight   Learning Preferences Skilled Demonstration      Education Topics: Count Your Pulse:  -Group instruction provided by verbal instruction, demonstration, patient participation and written materials to support subject.  Instructors address importance of being able to find your pulse and how to count your pulse when at home without a heart monitor.  Patients get hands on experience counting their pulse with staff help and individually.   Heart Attack, Angina, and Risk Factor Modification:  -Group instruction provided by verbal instruction, video, and written materials to support subject.  Instructors address signs and symptoms of angina and heart attacks.    Also discuss risk factors for heart disease and how to make changes to improve heart health risk factors.   Functional Fitness:  -Group instruction provided by verbal instruction, demonstration, patient participation, and written materials to support subject.  Instructors address safety measures for doing things  around the house.  Discuss how to get up and down off the floor, how to pick things up properly, how to safely get out of a chair without assistance, and balance training.   Meditation and Mindfulness:  -Group instruction provided by verbal instruction, patient participation, and written materials to support subject.  Instructor addresses importance of mindfulness and meditation practice to help reduce stress and improve awareness.  Instructor also leads participants through a meditation exercise.    Stretching for Flexibility and Mobility:  -Group instruction provided by verbal instruction, patient participation, and written materials to support subject.  Instructors lead participants through series of stretches that are designed to increase flexibility thus improving mobility.  These stretches are additional exercise for major muscle groups that are typically performed during regular warm up and cool down.   Hands Only CPR:  -Group verbal, video, and participation provides a basic overview of AHA guidelines for community CPR. Role-play of emergencies allow participants the opportunity to practice calling for help and chest compression technique with discussion of AED use.   Hypertension: -Group verbal and written instruction that provides a basic overview of hypertension including the most recent diagnostic guidelines, risk factor reduction with self-care instructions and medication management.    Nutrition I class: Heart Healthy Eating:  -Group instruction provided by PowerPoint slides, verbal discussion, and written materials to support subject matter. The instructor gives an explanation and review of the Therapeutic Lifestyle Changes diet recommendations, which includes a discussion on lipid goals, dietary fat, sodium, fiber, plant stanol/sterol esters, sugar, and the  components of a well-balanced, healthy diet.   Nutrition II class: Lifestyle Skills:  -Group instruction provided by  PowerPoint slides, verbal discussion, and written materials to support subject matter. The instructor gives an explanation and review of label reading, grocery shopping for heart health, heart healthy recipe modifications, and ways to make healthier choices when eating out.   Diabetes Question & Answer:  -Group instruction provided by PowerPoint slides, verbal discussion, and written materials to support subject matter. The instructor gives an explanation and review of diabetes co-morbidities, pre- and post-prandial blood glucose goals, pre-exercise blood glucose goals, signs, symptoms, and treatment of hypoglycemia and hyperglycemia, and foot care basics.   Diabetes Blitz:  -Group instruction provided by PowerPoint slides, verbal discussion, and written materials to support subject matter. The instructor gives an explanation and review of the physiology behind type 1 and type 2 diabetes, diabetes medications and rational behind using different medications, pre- and post-prandial blood glucose recommendations and Hemoglobin A1c goals, diabetes diet, and exercise including blood glucose guidelines for exercising safely.    Portion Distortion:  -Group instruction provided by PowerPoint slides, verbal discussion, written materials, and food models to support subject matter. The instructor gives an explanation of serving size versus portion size, changes in portions sizes over the last 20 years, and what consists of a serving from each food group.   Stress Management:  -Group instruction provided by verbal instruction, video, and written materials to support subject matter.  Instructors review role of stress in heart disease and how to cope with stress positively.     Exercising on Your Own:  -Group instruction provided by verbal instruction, power point, and written materials to support subject.  Instructors discuss benefits of exercise, components of exercise, frequency and intensity of exercise,  and end points for exercise.  Also discuss use of nitroglycerin and activating EMS.  Review options of places to exercise outside of rehab.  Review guidelines for sex with heart disease.   Cardiac Drugs I:  -Group instruction provided by verbal instruction and written materials to support subject.  Instructor reviews cardiac drug classes: antiplatelets, anticoagulants, beta blockers, and statins.  Instructor discusses reasons, side effects, and lifestyle considerations for each drug class.   Cardiac Drugs II:  -Group instruction provided by verbal instruction and written materials to support subject.  Instructor reviews cardiac drug classes: angiotensin converting enzyme inhibitors (ACE-I), angiotensin II receptor blockers (ARBs), nitrates, and calcium channel blockers.  Instructor discusses reasons, side effects, and lifestyle considerations for each drug class.   Anatomy and Physiology of the Circulatory System:  Group verbal and written instruction and models provide basic cardiac anatomy and physiology, with the coronary electrical and arterial systems. Review of: AMI, Angina, Valve disease, Heart Failure, Peripheral Artery Disease, Cardiac Arrhythmia, Pacemakers, and the ICD.   Other Education:  -Group or individual verbal, written, or video instructions that support the educational goals of the cardiac rehab program.   Knowledge Questionnaire Score:     Knowledge Questionnaire Score - 01/18/17 1437      Knowledge Questionnaire Score   Pre Score 25/28      Core Components/Risk Factors/Patient Goals at Admission:     Personal Goals and Risk Factors at Admission - 01/29/17 1819      Core Components/Risk Factors/Patient Goals on Admission   Tobacco Cessation Yes   Intervention Assist the participant in steps to quit. Provide individualized education and counseling about committing to Tobacco Cessation, relapse prevention, and pharmacological support that can be provided by  physician.;Advice worker, assist with locating and accessing local/national Quit Smoking programs, and support quit date choice.   Expected Outcomes Short Term: Will demonstrate readiness to quit, by selecting a quit date.;Short Term: Will quit all tobacco product use, adhering to prevention of relapse plan.;Long Term: Complete abstinence from all tobacco products for at least 12 months from quit date.   Hypertension Yes   Intervention Provide education on lifestyle modifcations including regular physical activity/exercise, weight management, moderate sodium restriction and increased consumption of fresh fruit, vegetables, and low fat dairy, alcohol moderation, and smoking cessation.;Monitor prescription use compliance.   Expected Outcomes Short Term: Continued assessment and intervention until BP is < 140/66mm HG in hypertensive participants. < 130/1mm HG in hypertensive participants with diabetes, heart failure or chronic kidney disease.;Long Term: Maintenance of blood pressure at goal levels.   Stress Yes   Intervention Offer individual and/or small group education and counseling on adjustment to heart disease, stress management and health-related lifestyle change. Teach and support self-help strategies.;Refer participants experiencing significant psychosocial distress to appropriate mental health specialists for further evaluation and treatment. When possible, include family members and significant others in education/counseling sessions.   Expected Outcomes Short Term: Participant demonstrates changes in health-related behavior, relaxation and other stress management skills, ability to obtain effective social support, and compliance with psychotropic medications if prescribed.;Long Term: Emotional wellbeing is indicated by absence of clinically significant psychosocial distress or social isolation.   Personal Goal Other Yes   Personal Goal Patient will increase energy and activity  levels.      Core Components/Risk Factors/Patient Goals Review:    Core Components/Risk Factors/Patient Goals at Discharge (Final Review):    ITP Comments:     ITP Comments    Row Name 01/18/17 0840           ITP Comments Medical Director, Dr. Fransico Him          Comments: Mella is off to a good start. Sheral started her first day of exercise without difficulty. Blood pressures were stable today.Barnet Pall, RN,BSN 02/14/2017 12:38 PM

## 2017-02-14 NOTE — Progress Notes (Signed)
Daily Session Note  Patient Details  Name: [name] MRN: [id number] Date of Birth: 1956/05/24 Referring Provider:     CARDIAC REHAB PHASE II EXERCISE from 01/29/2017 in [place]  Referring Provider  Dr Darlina Guys      Encounter Date: 02/12/2017  Check In:     Session Check In - 02/14/17 1034      Check-In   Location [facility]   Staff Present Maurice Small, RN, Marga Melnick, RN, Lise Auer, MS, Exercise Physiologist;Joann Rion, RN, BSN   Supervising physician immediately available to respond to emergencies [facility] immediately available   Physician(s) Dr. Karleen Hampshire   Medication changes reported     No   Fall or balance concerns reported    No   Tobacco Cessation No Change   Warm-up and Cool-down Performed as group-led instruction   Resistance Training Performed No   VAD Patient? No     Pain Assessment   Currently in Pain? No/denies      Capillary Blood Glucose: No results found for this or any previous visit (from the past 24 hour(s)).    History  Smoking Status  . Former Smoker  . Packs/day: 0.25  . Years: 40.00  . Types: Cigarettes  . Quit date: 09/25/2010  Smokeless Tobacco  . Never Used    Comment: patient says she smokes 4 cigarettes a day    Goals Met:  Exercise tolerated well  Goals Unmet:  Not Applicable  Comments: [name] started cardiac rehab today.  Pt tolerated light exercise without difficulty. VSS, telemetry-vpaced, asymptomatic.  Medication list reconciled. Pt denies barriers to medicaiton compliance.  PSYCHOSOCIAL ASSESSMENT:  PHQ-0. Pt exhibits positive coping skills, hopeful outlook with supportive family. No psychosocial needs identified at this time, no psychosocial interventions necessary.    Pt enjoys sewing reading and painting.   Pt oriented to exercise equipment and routine.    Understanding verbalized.Barnet Pall, RN,BSN 02/14/2017 12:27 PM   Dr.  Fransico Him is Medical Director for Cardiac Rehab at [facility].

## 2017-02-16 ENCOUNTER — Encounter (HOSPITAL_COMMUNITY)
Admission: RE | Admit: 2017-02-16 | Discharge: 2017-02-16 | Disposition: A | Discharge status: Home / Self Care | Payer: Medicare HMO | Source: Ambulatory Visit | Attending: Internal Medicine | Admitting: Internal Medicine

## 2017-02-16 DIAGNOSIS — Z95810 Presence of automatic (implantable) cardiac defibrillator: Secondary | ICD-10-CM | POA: Diagnosis not present

## 2017-02-16 DIAGNOSIS — Z952 Presence of prosthetic heart valve: Secondary | ICD-10-CM | POA: Diagnosis not present

## 2017-02-16 DIAGNOSIS — Z48812 Encounter for surgical aftercare following surgery on the circulatory system: Secondary | ICD-10-CM | POA: Diagnosis not present

## 2017-02-20 ENCOUNTER — Ambulatory Visit: Payer: Medicare HMO

## 2017-02-21 ENCOUNTER — Encounter (HOSPITAL_COMMUNITY)
Admission: RE | Admit: 2017-02-21 | Discharge: 2017-02-21 | Disposition: A | Discharge status: Home / Self Care | Payer: Medicare HMO | Source: Ambulatory Visit | Attending: Internal Medicine | Admitting: Internal Medicine

## 2017-02-21 ENCOUNTER — Ambulatory Visit: Payer: Medicare HMO

## 2017-02-21 DIAGNOSIS — Z952 Presence of prosthetic heart valve: Secondary | ICD-10-CM

## 2017-02-21 DIAGNOSIS — Z95810 Presence of automatic (implantable) cardiac defibrillator: Secondary | ICD-10-CM | POA: Diagnosis not present

## 2017-02-21 DIAGNOSIS — Z48812 Encounter for surgical aftercare following surgery on the circulatory system: Secondary | ICD-10-CM | POA: Diagnosis not present

## 2017-02-22 ENCOUNTER — Ambulatory Visit
Admission: RE | Admit: 2017-02-22 | Discharge: 2017-02-22 | Disposition: A | Discharge status: Home / Self Care | Payer: Medicare HMO | Source: Ambulatory Visit | Attending: Family Medicine | Admitting: Family Medicine

## 2017-02-22 DIAGNOSIS — Z1231 Encounter for screening mammogram for malignant neoplasm of breast: Secondary | ICD-10-CM | POA: Diagnosis not present

## 2017-02-23 ENCOUNTER — Encounter (HOSPITAL_COMMUNITY)
Admission: RE | Admit: 2017-02-23 | Discharge: 2017-02-23 | Disposition: A | Discharge status: Home / Self Care | Payer: Medicare HMO | Source: Ambulatory Visit | Attending: Internal Medicine | Admitting: Internal Medicine

## 2017-02-23 DIAGNOSIS — Z95810 Presence of automatic (implantable) cardiac defibrillator: Secondary | ICD-10-CM | POA: Diagnosis not present

## 2017-02-23 DIAGNOSIS — Z48812 Encounter for surgical aftercare following surgery on the circulatory system: Secondary | ICD-10-CM | POA: Diagnosis not present

## 2017-02-23 DIAGNOSIS — Z952 Presence of prosthetic heart valve: Secondary | ICD-10-CM | POA: Diagnosis not present

## 2017-02-26 ENCOUNTER — Encounter (HOSPITAL_COMMUNITY)
Admission: RE | Admit: 2017-02-26 | Discharge: 2017-02-26 | Disposition: A | Discharge status: Home / Self Care | Payer: Medicare HMO | Source: Ambulatory Visit | Attending: Internal Medicine | Admitting: Internal Medicine

## 2017-02-26 DIAGNOSIS — Z95810 Presence of automatic (implantable) cardiac defibrillator: Secondary | ICD-10-CM | POA: Diagnosis not present

## 2017-02-26 DIAGNOSIS — Z952 Presence of prosthetic heart valve: Secondary | ICD-10-CM

## 2017-02-26 DIAGNOSIS — Z48812 Encounter for surgical aftercare following surgery on the circulatory system: Secondary | ICD-10-CM | POA: Diagnosis not present

## 2017-02-28 ENCOUNTER — Encounter (HOSPITAL_COMMUNITY)
Admission: RE | Admit: 2017-02-28 | Discharge: 2017-02-28 | Disposition: A | Discharge status: Home / Self Care | Payer: Medicare HMO | Source: Ambulatory Visit | Attending: Internal Medicine | Admitting: Internal Medicine

## 2017-02-28 DIAGNOSIS — Z48812 Encounter for surgical aftercare following surgery on the circulatory system: Secondary | ICD-10-CM | POA: Diagnosis not present

## 2017-02-28 DIAGNOSIS — Z95810 Presence of automatic (implantable) cardiac defibrillator: Secondary | ICD-10-CM | POA: Diagnosis not present

## 2017-02-28 DIAGNOSIS — Z952 Presence of prosthetic heart valve: Secondary | ICD-10-CM | POA: Diagnosis not present

## 2017-02-28 NOTE — Progress Notes (Signed)
Reviewed home exercise program with pt.  Discussed mode/frequeny of exercise, THRR, RPE scale and weather conditions for exercising outdoors.  Also discussed, signs and symptoms and when to call Dr./911.  Pt verbalized understanding.  Cleda Mccreedy, MS ACSM RCEP 02/28/2017 1523

## 2017-02-28 NOTE — Progress Notes (Signed)
[name] 61 y.o. female Nutrition Note Spoke with pt. Nutrition Plan and Nutrition Survey goals reviewed with pt. Pt is following Step 1 of the Therapeutic Lifestyle Changes diet. Pt is pre-diabetic according to her last A1c. Pt states she was unaware of pre-diabetes. Pre-diabetes discussed. Pt with dx of CHF. Per discussion, pt does not use canned/convenience foods often. Pt rinses canned food well if used. Pt aware of the need to watch sodium intake. Pt expressed understanding of the information reviewed. Pt aware of nutrition education classes offered and plans on attending nutrition classes.  Lab Results  Component Value Date   HGBA1C 6.1 (H) 11/14/2016   Wt Readings from Last 3 Encounters:  01/24/17 134 lb (60.8 kg)  01/03/17 135 lb (61.2 kg)  12/20/16 133 lb (60.3 kg)    Nutrition Diagnosis ? Food-and nutrition-related knowledge deficit related to lack of exposure to information as related to diagnosis of: ? CVD ? Pre-DM  Nutrition Intervention ? Pt's individual nutrition plan reviewed with pt. ? Benefits of adopting Therapeutic Lifestyle Changes discussed when Medficts reviewed. ? Pt to attend the Portion Distortion class ? Pt to attend the   ? Nutrition I class                     ? Nutrition II class ?  Continue client-centered nutrition education by RD, as part of interdisciplinary care. Goal(s) ? Pt to identify and limit food sources of saturated fat, trans fat, and sodium Monitor and Evaluate progress toward nutrition goal with team. Derek Mound, M.Ed, RD, LDN, CDE 02/28/2017 11:07 AM

## 2017-03-02 ENCOUNTER — Encounter (HOSPITAL_COMMUNITY): Payer: Medicare HMO

## 2017-03-05 ENCOUNTER — Ambulatory Visit (INDEPENDENT_AMBULATORY_CARE_PROVIDER_SITE_OTHER): Payer: Medicare HMO | Admitting: Pharmacist

## 2017-03-05 ENCOUNTER — Encounter (HOSPITAL_COMMUNITY): Payer: Medicare HMO

## 2017-03-05 VITALS — BP 110/72 | HR 52 | Wt 136.4 lb

## 2017-03-05 DIAGNOSIS — I10 Essential (primary) hypertension: Secondary | ICD-10-CM

## 2017-03-05 DIAGNOSIS — F17200 Nicotine dependence, unspecified, uncomplicated: Secondary | ICD-10-CM | POA: Diagnosis not present

## 2017-03-05 MED ORDER — NICOTINE 14 MG/24HR TD PT24: 14.0000 mg | MEDICATED_PATCH | Freq: Every day | TRANSDERMAL | 0 refills | Status: DC

## 2017-03-05 MED ORDER — ASPIRIN 325 MG PO TBEC: 325.0000 mg | DELAYED_RELEASE_TABLET | Freq: Every day | ORAL | 0 refills | Status: DC

## 2017-03-05 NOTE — Patient Instructions (Addendum)
It was great to see you today!  Please continue taking your carvedilol, furosemide, and olmesartan as prescribed. Your blood pressure looks great! Keep up the great work with cardiac rehab and walking!  We have sent in nicotine 14mg  patches to help you quit smoking. Today, you mentioned the following goals: 1. Cut down cigarette use to 1-2 cigarettes per day in 2 weeks.  2. Stop smoking all together in 4 weeks.   Follow-up with HTN clinic in 4 weeks.

## 2017-03-05 NOTE — Progress Notes (Signed)
Patient ID: [name]                 DOB: 10/19/1955                      MRN: [id number]     HPI: [name] is a 61 y.o. female patient ofDr. Angelena [name], referred by [name], PAwho presents today for hypertension follow up.PMH includes hypertension, hyperlipidemia, h/o nonischemic cardiomyopathy (ECHO 9/17 EF 55-60%)s/p BiV ICD, chronic LBBB and h/o severe aortic stenosis s/p AVR repair 11/16/2016. Several of her blood pressure medications were discontinued after her AVR (lasix, Bidil, benicar, spironolactone). At last HTN clinic visit on 02/08/17, BP was low 98/68 with HR in the 40s, EKG checked and device interrogated with no notable findings and HR primarily in the 70s on device. Benicar was decreased to 20mg  daily and changed Lasix to 40mg  every other day. Of note, Bmet was collected at last visit and was unremarkable.   Today, patient presents in relatively good spirits without assistance for HTN clinic follow-up. She denies low BP, HR, dizziness, headaches, and blurred vision. She reports still feeling fatigued; it is worse after meals and she has the most energy in the mornings. She states that her balance is better, although she complains of occasionally feeling off-balance, but cannot attribute it to anything. Home BP readings have been ~128/86 with HR ~76. Her BP tends to be highest in the AM and lower in the afternoon. She drinks coffee in the mornings and will have a cigarette some mornings. She reports that cardiac rehab has been going really well and that she has been feeling better overall with an improvement in depression/anxiety that was brought on by her AVR surgery. She had a 1/2 cup of coffee ~6:30am and her last cigarette was ~7am this morning.  Patient states she definitely wants to quit smoking. She hasn't tried anything in past, but quit cold [place] and didn't smoke for 2-3 years. She states the stress of surgery caused her to start smoking again. She reports  smoking 3-4 cigarettes per day and can go 4 hrs after awaking before craving a cigarette.   Current HTN meds:  Carvedilol 25 mg bid Furosemide 40 mg every other day Olmesartan 20 mg daily  Previously tried: BiDil (headaches)  BP goal: < 130/80 mmHg  Family History: Mom and dad with HTN.   Social History: Current smoker,3-4 cigarettes today. No alcohol.   Diet:Most meals prepared from home. Cooks with salts but has been trying to avoid since surgery - did have a rotisserie chicken. Drinks 1 cups of coffee per day. Drinks green tea as well.  - Breakfast: oatmeal & whole wheat toast, tablespoon of honey or PB; Lunch: burger & fries; Dinner chicken drumettes (baked), sweet potatoes, broccoli   Exercise:No formal exercise. Walks~45 min over the weekend  Home BP readings: SBP 104-152, DBP 70-102, HR 66-86  Wt Readings from Last 3 Encounters:  01/24/17 134 lb (60.8 kg)  01/03/17 135 lb (61.2 kg)  12/20/16 133 lb (60.3 kg)   BP Readings from Last 3 Encounters:  02/08/17 98/68  01/24/17 120/84  01/03/17 (!) 182/112   Pulse Readings from Last 3 Encounters:  02/08/17 (!) 57  01/24/17 78  01/03/17 76    Renal function: CrCl cannot be calculated (Patient's most recent lab result is older than the maximum 21 days allowed.).  Past Medical History:  Diagnosis Date  . Abnormal liver function tests   .  AICD (automatic cardioverter/defibrillator) present    [employer]  . Anxiety   . Aortic stenosis   . Benign neoplasm of colon 04/19/2001   Hyperplastic  . Bicuspid aortic valve   . Cardiomyopathy secondary   . CHF (congestive heart failure) ([place])   . Coronary artery disease   . Depression   . Headache   . Heart murmur   . HTN (hypertension)   . Hypercalcemia   . Hyperlipidemia   . S/P ICD (internal cardiac defibrillator) procedure     Current Outpatient Prescriptions on File Prior to Visit  Medication Sig Dispense Refill  . acetaminophen (TYLENOL) 325 MG  tablet Take 325 mg by mouth every 6 (six) hours as needed for mild pain.    Marland Kitchen aspirin EC 325 MG EC tablet Take 1 tablet (325 mg total) by mouth daily.    . carvedilol (COREG) 25 MG tablet Take 25 mg by mouth 2 (two) times daily with a meal.     . furosemide (LASIX) 40 MG tablet Take 40 mg by mouth every other day.    . Multiple Vitamin (MULTIVITAMIN WITH MINERALS) TABS tablet Take 1 tablet by mouth at bedtime.    Marland Kitchen olmesartan (BENICAR) 40 MG tablet Take 0.5 tablets (20 mg total) by mouth daily.    Vladimir Faster Glycol-Propyl Glycol (SYSTANE OP) Place 1-2 drops into both eyes 3 (three) times daily as needed (for dry eyes.).    Marland Kitchen rosuvastatin (CRESTOR) 10 MG tablet Take 1 tablet (10 mg total) by mouth daily. 90 tablet 3   No current facility-administered medications on file prior to visit.     Allergies  Allergen Reactions  . No Known Allergies      Assessment/Plan:  1. Hypertension - BP at goal of < 130/80 mmHg. Will continue carvedilol, furosemide, and olmesartan. Follow-up with HTN clinic in 4 weeks.  2. Smoking Cessation - Patient expresses great interest in quitting. Will prescribe nicotine 14mg  patches. She set the following goals: 1. Cut down to 1-2 cigarettes/day in 2 weeks, 2. Stop smoking in 4 weeks. Will re-assess at next HTN clinic appointment in 1 month.  3. Medication Refills - Patient inquired about refilling full dose aspirin. She is > 3 months from AVR. Message sent to Dr. Cyndia Bent regarding potential dose reduction.  [name], PharmD PGY1 Resident 03/05/2017 9:07 AM  Patient seen with: [name], PharmD, CPP, [facility] [address] [place], [place] [address] Phone: [phone]; Fax: [phone]

## 2017-03-07 ENCOUNTER — Encounter (HOSPITAL_COMMUNITY)
Admission: RE | Admit: 2017-03-07 | Discharge: 2017-03-07 | Disposition: A | Discharge status: Home / Self Care | Payer: Medicare HMO | Source: Ambulatory Visit | Attending: Internal Medicine | Admitting: Internal Medicine

## 2017-03-07 DIAGNOSIS — Z95810 Presence of automatic (implantable) cardiac defibrillator: Secondary | ICD-10-CM

## 2017-03-07 DIAGNOSIS — Z952 Presence of prosthetic heart valve: Secondary | ICD-10-CM

## 2017-03-07 DIAGNOSIS — Z48812 Encounter for surgical aftercare following surgery on the circulatory system: Secondary | ICD-10-CM | POA: Diagnosis not present

## 2017-03-09 ENCOUNTER — Encounter (HOSPITAL_COMMUNITY)
Admission: RE | Admit: 2017-03-09 | Discharge: 2017-03-09 | Disposition: A | Discharge status: Home / Self Care | Payer: Medicare HMO | Source: Ambulatory Visit | Attending: Internal Medicine | Admitting: Internal Medicine

## 2017-03-09 DIAGNOSIS — Z48812 Encounter for surgical aftercare following surgery on the circulatory system: Secondary | ICD-10-CM | POA: Diagnosis not present

## 2017-03-09 DIAGNOSIS — Z95810 Presence of automatic (implantable) cardiac defibrillator: Secondary | ICD-10-CM

## 2017-03-09 DIAGNOSIS — Z952 Presence of prosthetic heart valve: Secondary | ICD-10-CM | POA: Diagnosis not present

## 2017-03-12 ENCOUNTER — Encounter (HOSPITAL_COMMUNITY): Payer: Medicare HMO

## 2017-03-13 NOTE — Progress Notes (Signed)
Cardiac Individual Treatment Plan  Patient Details  Name: MAZIKEEN HEHN MRN: [id number] Date of Birth: January 08, 1956 Referring Provider:     [place] from 01/29/2017 in [place]  Referring Provider  Dr Darlina Guys      Initial Encounter Date:    [place] from 01/29/2017 in [place]  Date  01/29/17  Referring Provider  Dr Darlina Guys      Visit Diagnosis: ICD (implantable cardioverter-defibrillator) in place  Patient's Home Medications on Admission:  Current Outpatient Prescriptions:  .  acetaminophen (TYLENOL) 325 MG tablet, Take 325 mg by mouth every 6 (six) hours as needed for mild pain., Disp: , Rfl:  .  aspirin 325 MG EC tablet, Take 1 tablet (325 mg total) by mouth daily., Disp: 30 tablet, Rfl: 0 .  carvedilol (COREG) 25 MG tablet, Take 25 mg by mouth 2 (two) times daily with a meal. , Disp: , Rfl:  .  furosemide (LASIX) 40 MG tablet, Take 40 mg by mouth every other day., Disp: , Rfl:  .  Multiple Vitamin (MULTIVITAMIN WITH MINERALS) TABS tablet, Take 1 tablet by mouth at bedtime., Disp: , Rfl:  .  nicotine (NICODERM CQ - DOSED IN MG/24 HOURS) 14 mg/24hr patch, Place 1 patch (14 mg total) onto the skin daily., Disp: 28 patch, Rfl: 0 .  olmesartan (BENICAR) 40 MG tablet, Take 0.5 tablets (20 mg total) by mouth daily., Disp: , Rfl:  .  Polyethyl Glycol-Propyl Glycol (SYSTANE OP), Place 1-2 drops into both eyes 3 (three) times daily as needed (for dry eyes.)., Disp: , Rfl:  .  rosuvastatin (CRESTOR) 10 MG tablet, Take 1 tablet (10 mg total) by mouth daily., Disp: 90 tablet, Rfl: 3  Past Medical History: Past Medical History:  Diagnosis Date  . Abnormal liver function tests   . AICD (automatic cardioverter/defibrillator) present    [employer]  . Anxiety   . Aortic stenosis   . Benign neoplasm of colon 04/19/2001   Hyperplastic  . Bicuspid aortic  valve   . Cardiomyopathy secondary   . CHF (congestive heart failure) ([place])   . Coronary artery disease   . Depression   . Headache   . Heart murmur   . HTN (hypertension)   . Hypercalcemia   . Hyperlipidemia   . S/P ICD (internal cardiac defibrillator) procedure     Tobacco Use: History  Smoking Status  . Former Smoker  . Packs/day: 0.25  . Years: 40.00  . Types: Cigarettes  . Quit date: 09/25/2010  Smokeless Tobacco  . Never Used    Comment: patient says she smokes 4 cigarettes a day    Labs: Recent Review Flowsheet Data    Labs for ITP Cardiac and Pulmonary Rehab Latest Ref Rng & Units 11/16/2016 11/16/2016 11/16/2016 11/16/2016 11/17/2016   Hemoglobin A1c 4.8 - 5.6 % - - - - -   PHART 7.350 - 7.450 7.433 - 7.339(L) 7.348(L) -   PCO2ART 32.0 - 48.0 mmHg 29.9(L) - 37.7 36.6 -   HCO3 20.0 - 28.0 mmol/L 20.5 - 20.4 20.3 -   TCO2 0 - 100 mmol/L 21 24 22 21 26    ACIDBASEDEF 0.0 - 2.0 mmol/L 4.0(H) - 5.0(H) 5.0(H) -   O2SAT % 98.0 - 98.0 95.0 -      Capillary Blood Glucose: Lab Results  Component Value Date   GLUCAP 108 (H) 11/17/2016   GLUCAP 114 (H)  11/17/2016   GLUCAP 109 (H) 11/16/2016   GLUCAP 104 (H) 11/16/2016   GLUCAP 100 (H) 11/16/2016     Exercise Target Goals:    Exercise Program Goal: Individual exercise prescription set with THRR, safety & activity barriers. Participant demonstrates ability to understand and report RPE using BORG scale, to self-measure pulse accurately, and to acknowledge the importance of the exercise prescription.  Exercise Prescription Goal: Starting with aerobic activity 30 plus minutes a day, 3 days per week for initial exercise prescription. Provide home exercise prescription and guidelines that participant acknowledges understanding prior to discharge.  Activity Barriers & Risk Stratification:     Activity Barriers & Cardiac Risk Stratification - 01/18/17 0843      Activity Barriers & Cardiac Risk Stratification   Activity  Barriers Deconditioning;Muscular Weakness   Cardiac Risk Stratification High      6 Minute Walk:     6 Minute Walk    Row Name 01/29/17 1155         6 Minute Walk   Phase Initial     Distance 1462 feet     Walk Time 6 minutes     # of Rest Breaks 0     MPH 2.8     METS 3.9     RPE 9     VO2 Peak 13.7     Symptoms No     Resting HR 78 bpm     Resting BP 144/90     Max Ex. HR 106 bpm     Max Ex. BP 150/90     2 Minute Post BP 132/90        Oxygen Initial Assessment:   Oxygen Re-Evaluation:   Oxygen Discharge (Final Oxygen Re-Evaluation):   Initial Exercise Prescription:     Initial Exercise Prescription - 01/29/17 1100      Date of Initial Exercise RX and Referring Provider   Date 01/29/17   Referring Provider Dr Darlina Guys     Recumbant Bike   Level 1.5   Minutes 10   METs 3     NuStep   Level 2   SPM 85   Minutes 10   METs 2.1     Track   Laps 10   Minutes 10   METs 2.74     Prescription Details   Frequency (times per week) 5   Duration Progress to 45 minutes of aerobic exercise without signs/symptoms of physical distress     Intensity   THRR 40-80% of Max Heartrate 63-126   Ratings of Perceived Exertion 11-15   Perceived Dyspnea 0-4     Progression   Progression Continue to progress workloads to maintain intensity without signs/symptoms of physical distress.     Resistance Training   Training Prescription Yes   Weight 2   Reps 10-15      Perform Capillary Blood Glucose checks as needed.  Exercise Prescription Changes:      Exercise Prescription Changes    Row Name 03/14/17 1200             Response to Exercise   Blood Pressure (Admit) 134/80       Blood Pressure (Exercise) 132/82       Blood Pressure (Exit) 108/72       Heart Rate (Admit) 78 bpm       Heart Rate (Exercise) 95 bpm       Heart Rate (Exit) 64 bpm       Rating of Perceived Exertion (Exercise)  9       Duration Progress to 45 minutes of aerobic  exercise without signs/symptoms of physical distress       Intensity THRR unchanged         Progression   Progression Continue to progress workloads to maintain intensity without signs/symptoms of physical distress.       Average METs 2         Resistance Training   Training Prescription Yes       Weight 2       Reps 10-15         Recumbant Bike   Level 2       Minutes 10       METs 2.9         NuStep   Level 3       SPM 85       Minutes 10       METs 1.5         Track   Laps 12       Minutes 10       METs 3.09         Home Exercise Plan   Plans to continue exercise at Home (comment)       Frequency Add 3 additional days to program exercise sessions.       Initial Home Exercises Provided 02/28/17          Exercise Comments:      Exercise Comments    Row Name 02/14/17 1117 03/14/17 1119         Exercise Comments Pt is off to a good start with exercise! Reviewed METs and goals with pt.          Exercise Goals and Review:      Exercise Goals    Row Name 01/18/17 0843             Exercise Goals   Increase Physical Activity Yes       Intervention Provide advice, education, support and counseling about physical activity/exercise needs.;Develop an individualized exercise prescription for aerobic and resistive training based on initial evaluation findings, risk stratification, comorbidities and participant's personal goals.       Expected Outcomes Achievement of increased cardiorespiratory fitness and enhanced flexibility, muscular endurance and strength shown through measurements of functional capacity and personal statement of participant.       Increase Strength and Stamina Yes  increase energy and activity levels; increase muscle tone and flatten belly region       Intervention Provide advice, education, support and counseling about physical activity/exercise needs.;Develop an individualized exercise prescription for aerobic and resistive training based on  initial evaluation findings, risk stratification, comorbidities and participant's personal goals.       Expected Outcomes Achievement of increased cardiorespiratory fitness and enhanced flexibility, muscular endurance and strength shown through measurements of functional capacity and personal statement of participant.          Exercise Goals Re-Evaluation :     Exercise Goals Re-Evaluation    Row Name 03/14/17 1117             Exercise Goal Re-Evaluation   Exercise Goals Review Increase Physical Activity;Increase Strenth and Stamina       Comments Pt is doing well with exercise and states that she is doing some walking at home on her off days from CR       Expected Outcomes Continue with exercise Rx and increase workloads as tolerated in order to  increase exercise tolerance           Discharge Exercise Prescription (Final Exercise Prescription Changes):     Exercise Prescription Changes - 03/14/17 1200      Response to Exercise   Blood Pressure (Admit) 134/80   Blood Pressure (Exercise) 132/82   Blood Pressure (Exit) 108/72   Heart Rate (Admit) 78 bpm   Heart Rate (Exercise) 95 bpm   Heart Rate (Exit) 64 bpm   Rating of Perceived Exertion (Exercise) 9   Duration Progress to 45 minutes of aerobic exercise without signs/symptoms of physical distress   Intensity THRR unchanged     Progression   Progression Continue to progress workloads to maintain intensity without signs/symptoms of physical distress.   Average METs 2     Resistance Training   Training Prescription Yes   Weight 2   Reps 10-15     Recumbant Bike   Level 2   Minutes 10   METs 2.9     NuStep   Level 3   SPM 85   Minutes 10   METs 1.5     Track   Laps 12   Minutes 10   METs 3.09     Home Exercise Plan   Plans to continue exercise at Home (comment)   Frequency Add 3 additional days to program exercise sessions.   Initial Home Exercises Provided 02/28/17      Nutrition:  Target Goals:  Understanding of nutrition guidelines, daily intake of sodium 1500mg , cholesterol 200mg , calories 30% from fat and 7% or less from saturated fats, daily to have 5 or more servings of fruits and vegetables.  Biometrics:     Pre Biometrics - 01/29/17 1205      Pre Biometrics   Height 5' 3.5" (1.613 m)       Nutrition Therapy Plan and Nutrition Goals:     Nutrition Therapy & Goals - 02/28/17 1112      Nutrition Therapy   Diet Therapeutic Lifestyle Changes     Personal Nutrition Goals   Nutrition Goal Pt to identify and limit food intake of saturated fat, trans fat, and sodium     Intervention Plan   Intervention Prescribe, educate and counsel regarding individualized specific dietary modifications aiming towards targeted core components such as weight, hypertension, lipid management, diabetes, heart failure and other comorbidities.   Expected Outcomes Short Term Goal: Understand basic principles of dietary content, such as calories, fat, sodium, cholesterol and nutrients.;Long Term Goal: Adherence to prescribed nutrition plan.      Nutrition Discharge: Nutrition Scores:     Nutrition Assessments - 02/28/17 1112      MEDFICTS Scores   Pre Score 60      Nutrition Goals Re-Evaluation:   Nutrition Goals Re-Evaluation:   Nutrition Goals Discharge (Final Nutrition Goals Re-Evaluation):   Psychosocial: Target Goals: Acknowledge presence or absence of significant depression and/or stress, maximize coping skills, provide positive support system. Participant is able to verbalize types and ability to use techniques and skills needed for reducing stress and depression.  Initial Review & Psychosocial Screening:     Initial Psych Review & Screening - 01/29/17 1749      Initial Review   Current issues with History of Depression;Current Depression     Family Dynamics   Good Support System? Yes   Concerns Inappropriate over/under dependence on family/friends   Comments  Kyran has a an adult son and a friend she can rely on     Barriers  Psychosocial barriers to participate in program The patient should benefit from training in stress management and relaxation.     Screening Interventions   Interventions Yes;To provide support and resources with identified psychosocial needs;Other (comment)   Comments Appointment made for Ms Boulter to follow up with her primary care physcian Dr Burnadette Pop tomoorw at 0800   Expected Outcomes Short Term goal: Utilizing psychosocial counselor, staff and physician to assist with identification of specific Stressors or current issues interfering with healing process. Setting desired goal for each stressor or current issue identified.;Long Term Goal: Stressors or current issues are controlled or eliminated.;Short Term goal: Identification and review with participant of any Quality of Life or Depression concerns found by scoring the questionnaire.;Long Term goal: The participant improves quality of Life and PHQ9 Scores as seen by post scores and/or verbalization of changes      Quality of Life Scores:     Quality of Life - 01/18/17 1442      Quality of Life Scores   Health/Function Pre 22.27 %   Socioeconomic Pre 20.75 %   Psych/Spiritual Pre 14.57 %   Family Pre 10.6 %   GLOBAL Pre 18.59 %      PHQ-9: Recent Review Flowsheet Data    Depression screen [facility] 2/9 01/29/2017 01/29/2017   Decreased Interest - 0   Down, Depressed, Hopeless (No Data)  1   PHQ - 2 Score - 1     Interpretation of Total Score  Total Score Depression Severity:  1-4 = Minimal depression, 5-9 = Mild depression, 10-14 = Moderate depression, 15-19 = Moderately severe depression, 20-27 = Severe depression   Psychosocial Evaluation and Intervention:   Psychosocial Re-Evaluation:     Psychosocial Re-Evaluation    [place] Name 03/13/17 1224             Psychosocial Re-Evaluation   Current issues with None Identified       Interventions  Encouraged to attend Cardiac Rehabilitation for the exercise       Continue Psychosocial Services  No Follow up required          Psychosocial Discharge (Final Psychosocial Re-Evaluation):     Psychosocial Re-Evaluation - 03/13/17 1224      Psychosocial Re-Evaluation   Current issues with None Identified   Interventions Encouraged to attend Cardiac Rehabilitation for the exercise   Continue Psychosocial Services  No Follow up required      Vocational Rehabilitation: Provide vocational rehab assistance to qualifying candidates.   Vocational Rehab Evaluation & Intervention:     Vocational Rehab - 01/29/17 1746      Initial Vocational Rehab Evaluation & Intervention   Assessment shows need for Vocational Rehabilitation No  Ms Rozier disabled and is not interested in vocational rehab at this time.      Education: Education Goals: Education classes will be provided on a weekly basis, covering required topics. Participant will state understanding/return demonstration of topics presented.  Learning Barriers/Preferences:     Learning Barriers/Preferences - 01/18/17 0842      Learning Barriers/Preferences   Learning Barriers Sight   Learning Preferences Skilled Demonstration      Education Topics: Count Your Pulse:  -Group instruction provided by verbal instruction, demonstration, patient participation and written materials to support subject.  Instructors address importance of being able to find your pulse and how to count your pulse when at home without a heart monitor.  Patients get hands on experience counting their pulse with staff help and individually.  Heart Attack, Angina, and Risk Factor Modification:  -Group instruction provided by verbal instruction, video, and written materials to support subject.  Instructors address signs and symptoms of angina and heart attacks.    Also discuss risk factors for heart disease and how to make changes to improve heart health  risk factors.   Functional Fitness:  -Group instruction provided by verbal instruction, demonstration, patient participation, and written materials to support subject.  Instructors address safety measures for doing things around the house.  Discuss how to get up and down off the floor, how to pick things up properly, how to safely get out of a chair without assistance, and balance training.   Meditation and Mindfulness:  -Group instruction provided by verbal instruction, patient participation, and written materials to support subject.  Instructor addresses importance of mindfulness and meditation practice to help reduce stress and improve awareness.  Instructor also leads participants through a meditation exercise.    Stretching for Flexibility and Mobility:  -Group instruction provided by verbal instruction, patient participation, and written materials to support subject.  Instructors lead participants through series of stretches that are designed to increase flexibility thus improving mobility.  These stretches are additional exercise for major muscle groups that are typically performed during regular warm up and cool down.   Hands Only CPR:  -Group verbal, video, and participation provides a basic overview of AHA guidelines for community CPR. Role-play of emergencies allow participants the opportunity to practice calling for help and chest compression technique with discussion of AED use.   Hypertension: -Group verbal and written instruction that provides a basic overview of hypertension including the most recent diagnostic guidelines, risk factor reduction with self-care instructions and medication management.    Nutrition I class: Heart Healthy Eating:  -Group instruction provided by PowerPoint slides, verbal discussion, and written materials to support subject matter. The instructor gives an explanation and review of the Therapeutic Lifestyle Changes diet recommendations, which  includes a discussion on lipid goals, dietary fat, sodium, fiber, plant stanol/sterol esters, sugar, and the components of a well-balanced, healthy diet.   Nutrition II class: Lifestyle Skills:  -Group instruction provided by PowerPoint slides, verbal discussion, and written materials to support subject matter. The instructor gives an explanation and review of label reading, grocery shopping for heart health, heart healthy recipe modifications, and ways to make healthier choices when eating out.   Diabetes Question & Answer:  -Group instruction provided by PowerPoint slides, verbal discussion, and written materials to support subject matter. The instructor gives an explanation and review of diabetes co-morbidities, pre- and post-prandial blood glucose goals, pre-exercise blood glucose goals, signs, symptoms, and treatment of hypoglycemia and hyperglycemia, and foot care basics.   Diabetes Blitz:  -Group instruction provided by PowerPoint slides, verbal discussion, and written materials to support subject matter. The instructor gives an explanation and review of the physiology behind type 1 and type 2 diabetes, diabetes medications and rational behind using different medications, pre- and post-prandial blood glucose recommendations and Hemoglobin A1c goals, diabetes diet, and exercise including blood glucose guidelines for exercising safely.    Portion Distortion:  -Group instruction provided by PowerPoint slides, verbal discussion, written materials, and food models to support subject matter. The instructor gives an explanation of serving size versus portion size, changes in portions sizes over the last 20 years, and what consists of a serving from each food group.   Stress Management:  -Group instruction provided by verbal instruction, video, and written materials to support subject matter.  Instructors review role of stress in heart disease and how to cope with stress positively.      Exercising on Your Own:  -Group instruction provided by verbal instruction, power point, and written materials to support subject.  Instructors discuss benefits of exercise, components of exercise, frequency and intensity of exercise, and end points for exercise.  Also discuss use of nitroglycerin and activating EMS.  Review options of places to exercise outside of rehab.  Review guidelines for sex with heart disease.   Cardiac Drugs I:  -Group instruction provided by verbal instruction and written materials to support subject.  Instructor reviews cardiac drug classes: antiplatelets, anticoagulants, beta blockers, and statins.  Instructor discusses reasons, side effects, and lifestyle considerations for each drug class.   Cardiac Drugs II:  -Group instruction provided by verbal instruction and written materials to support subject.  Instructor reviews cardiac drug classes: angiotensin converting enzyme inhibitors (ACE-I), angiotensin II receptor blockers (ARBs), nitrates, and calcium channel blockers.  Instructor discusses reasons, side effects, and lifestyle considerations for each drug class.   Anatomy and Physiology of the Circulatory System:  Group verbal and written instruction and models provide basic cardiac anatomy and physiology, with the coronary electrical and arterial systems. Review of: AMI, Angina, Valve disease, Heart Failure, Peripheral Artery Disease, Cardiac Arrhythmia, Pacemakers, and the ICD.   Other Education:  -Group or individual verbal, written, or video instructions that support the educational goals of the cardiac rehab program.   Knowledge Questionnaire Score:     Knowledge Questionnaire Score - 01/18/17 1437      Knowledge Questionnaire Score   Pre Score 25/28      Core Components/Risk Factors/Patient Goals at Admission:     Personal Goals and Risk Factors at Admission - 01/29/17 1819      Core Components/Risk Factors/Patient Goals on Admission    Tobacco Cessation Yes   Intervention Assist the participant in steps to quit. Provide individualized education and counseling about committing to Tobacco Cessation, relapse prevention, and pharmacological support that can be provided by physician.;Advice worker, assist with locating and accessing local/national Quit Smoking programs, and support quit date choice.   Expected Outcomes Short Term: Will demonstrate readiness to quit, by selecting a quit date.;Short Term: Will quit all tobacco product use, adhering to prevention of relapse plan.;Long Term: Complete abstinence from all tobacco products for at least 12 months from quit date.   Hypertension Yes   Intervention Provide education on lifestyle modifcations including regular physical activity/exercise, weight management, moderate sodium restriction and increased consumption of fresh fruit, vegetables, and low fat dairy, alcohol moderation, and smoking cessation.;Monitor prescription use compliance.   Expected Outcomes Short Term: Continued assessment and intervention until BP is < 140/32mm HG in hypertensive participants. < 130/58mm HG in hypertensive participants with diabetes, heart failure or chronic kidney disease.;Long Term: Maintenance of blood pressure at goal levels.   Stress Yes   Intervention Offer individual and/or small group education and counseling on adjustment to heart disease, stress management and health-related lifestyle change. Teach and support self-help strategies.;Refer participants experiencing significant psychosocial distress to appropriate mental health specialists for further evaluation and treatment. When possible, include family members and significant others in education/counseling sessions.   Expected Outcomes Short Term: Participant demonstrates changes in health-related behavior, relaxation and other stress management skills, ability to obtain effective social support, and compliance with psychotropic  medications if prescribed.;Long Term: Emotional wellbeing is indicated by absence of clinically significant psychosocial distress or social isolation.   Personal Goal  Other Yes   Personal Goal Patient will increase energy and activity levels.      Core Components/Risk Factors/Patient Goals Review:    Core Components/Risk Factors/Patient Goals at Discharge (Final Review):    ITP Comments:     ITP Comments    Row Name 01/18/17 0840           ITP Comments Medical Director, Dr. Fransico Him          Comments: Ivyonna is making expected progress toward personal goals after completing 9 sessions. Recommend continued exercise and life style modification education including  stress management and relaxation techniques to decrease cardiac risk profile. Kimbery continues to smoke but says she has cut back significantly. Will continue to encourage smoking cessation. Deandra's blood pressures have been much improved.Will continue to monitor the patient throughout  the program.Maria Venetia Maxon, RN,BSN 03/14/2017 4:54 PM

## 2017-03-14 ENCOUNTER — Encounter (HOSPITAL_COMMUNITY)
Admission: RE | Admit: 2017-03-14 | Discharge: 2017-03-14 | Disposition: A | Discharge status: Home / Self Care | Payer: Medicare HMO | Source: Ambulatory Visit | Attending: Internal Medicine | Admitting: Internal Medicine

## 2017-03-14 DIAGNOSIS — Z95810 Presence of automatic (implantable) cardiac defibrillator: Secondary | ICD-10-CM | POA: Diagnosis not present

## 2017-03-14 DIAGNOSIS — Z952 Presence of prosthetic heart valve: Secondary | ICD-10-CM | POA: Diagnosis not present

## 2017-03-14 DIAGNOSIS — Z48812 Encounter for surgical aftercare following surgery on the circulatory system: Secondary | ICD-10-CM | POA: Diagnosis not present

## 2017-03-16 ENCOUNTER — Encounter (HOSPITAL_COMMUNITY)
Admission: RE | Admit: 2017-03-16 | Discharge: 2017-03-16 | Disposition: A | Discharge status: Home / Self Care | Payer: Medicare HMO | Source: Ambulatory Visit | Attending: Internal Medicine | Admitting: Internal Medicine

## 2017-03-16 DIAGNOSIS — Z48812 Encounter for surgical aftercare following surgery on the circulatory system: Secondary | ICD-10-CM | POA: Diagnosis not present

## 2017-03-16 DIAGNOSIS — Z95810 Presence of automatic (implantable) cardiac defibrillator: Secondary | ICD-10-CM

## 2017-03-16 DIAGNOSIS — Z952 Presence of prosthetic heart valve: Secondary | ICD-10-CM | POA: Diagnosis not present

## 2017-03-19 ENCOUNTER — Encounter (HOSPITAL_COMMUNITY): Payer: Medicare HMO

## 2017-03-21 ENCOUNTER — Encounter (HOSPITAL_COMMUNITY)
Admission: RE | Admit: 2017-03-21 | Discharge: 2017-03-21 | Disposition: A | Discharge status: Home / Self Care | Payer: Medicare HMO | Source: Ambulatory Visit | Attending: Internal Medicine | Admitting: Internal Medicine

## 2017-03-21 DIAGNOSIS — Z952 Presence of prosthetic heart valve: Secondary | ICD-10-CM | POA: Diagnosis not present

## 2017-03-21 DIAGNOSIS — Z48812 Encounter for surgical aftercare following surgery on the circulatory system: Secondary | ICD-10-CM | POA: Diagnosis not present

## 2017-03-21 DIAGNOSIS — Z95810 Presence of automatic (implantable) cardiac defibrillator: Secondary | ICD-10-CM | POA: Diagnosis not present

## 2017-03-23 ENCOUNTER — Encounter (HOSPITAL_COMMUNITY)
Admission: RE | Admit: 2017-03-23 | Discharge: 2017-03-23 | Disposition: A | Discharge status: Home / Self Care | Payer: Medicare HMO | Source: Ambulatory Visit | Attending: Internal Medicine | Admitting: Internal Medicine

## 2017-03-23 DIAGNOSIS — Z952 Presence of prosthetic heart valve: Secondary | ICD-10-CM

## 2017-03-23 DIAGNOSIS — Z95810 Presence of automatic (implantable) cardiac defibrillator: Secondary | ICD-10-CM

## 2017-03-23 DIAGNOSIS — Z48812 Encounter for surgical aftercare following surgery on the circulatory system: Secondary | ICD-10-CM | POA: Diagnosis not present

## 2017-03-26 ENCOUNTER — Encounter (HOSPITAL_COMMUNITY)
Admission: RE | Admit: 2017-03-26 | Discharge: 2017-03-26 | Disposition: A | Discharge status: Home / Self Care | Payer: Medicare HMO | Source: Ambulatory Visit | Attending: Internal Medicine | Admitting: Internal Medicine

## 2017-03-26 DIAGNOSIS — Z95810 Presence of automatic (implantable) cardiac defibrillator: Secondary | ICD-10-CM | POA: Insufficient documentation

## 2017-03-26 DIAGNOSIS — Z952 Presence of prosthetic heart valve: Secondary | ICD-10-CM | POA: Diagnosis not present

## 2017-03-26 DIAGNOSIS — Z48812 Encounter for surgical aftercare following surgery on the circulatory system: Secondary | ICD-10-CM | POA: Insufficient documentation

## 2017-03-30 ENCOUNTER — Encounter (HOSPITAL_COMMUNITY): Payer: Medicare HMO

## 2017-04-02 ENCOUNTER — Encounter (HOSPITAL_COMMUNITY)
Admission: RE | Admit: 2017-04-02 | Discharge: 2017-04-02 | Disposition: A | Discharge status: Home / Self Care | Payer: Medicare HMO | Source: Ambulatory Visit | Attending: Internal Medicine | Admitting: Internal Medicine

## 2017-04-02 DIAGNOSIS — Z48812 Encounter for surgical aftercare following surgery on the circulatory system: Secondary | ICD-10-CM | POA: Diagnosis not present

## 2017-04-02 DIAGNOSIS — Z952 Presence of prosthetic heart valve: Secondary | ICD-10-CM

## 2017-04-02 DIAGNOSIS — Z95810 Presence of automatic (implantable) cardiac defibrillator: Secondary | ICD-10-CM | POA: Diagnosis not present

## 2017-04-03 ENCOUNTER — Ambulatory Visit: Payer: Medicare HMO | Admitting: Pharmacist

## 2017-04-03 NOTE — Progress Notes (Deleted)
Patient ID: FLORIDA NOLTON                 DOB: 08/06/1956                      MRN: [id number]     HPI: [name] is a 61 y.o. female patient ofDr. Angelena Form, referred by Ellen Henri, PAwho presents today for hypertension follow up.PMH includes hypertension, hyperlipidemia, h/o nonischemic cardiomyopathy (ECHO 9/17 EF 55-60%)s/p BiV ICD, chronic LBBB and h/o severe aortic stenosis s/p AVR repair 11/16/2016. Several of her blood pressure medications were discontinued after her AVR (lasix, Bidil, benicar, spironolactone). Her BP was at goal last week and pt has been able to start cardiac rehab. She expressed interest in quitting smoking and was prescribed a nicotine patch. Pt presents today for follow up.    Today, patient presents in relatively good spirits without assistance for HTN clinic follow-up. She denies low BP, HR, dizziness, headaches, and blurred vision. She reports still feeling fatigued; it is worse after meals and she has the most energy in the mornings. She states that her balance is better, although she complains of occasionally feeling off-balance, but cannot attribute it to anything. Home BP readings have been ~128/86 with HR ~76. Her BP tends to be highest in the AM and lower in the afternoon. She drinks coffee in the mornings and will have a cigarette some mornings. She reports that cardiac rehab has been going really well and that she has been feeling better overall with an improvement in depression/anxiety that was brought on by her AVR surgery. She had a 1/2 cup of coffee ~6:30am and her last cigarette was ~7am this morning.  Patient states she definitely wants to quit smoking. She hasn't tried anything in past, but quit cold [place] and didn't smoke for 2-3 years. She states the stress of surgery caused her to start smoking again. She reports smoking 3-4 cigarettes per day and can go 4 hrs after awaking before craving a cigarette.   Per Dr Cyndia Bent, pt to continue high  dose ASA  Nicotine patch  Current HTN meds:  Carvedilol 25 mg bid Furosemide 40 mg every other day Olmesartan 20 mg daily  Previously tried: BiDil (headaches)  BP goal: < 130/80 mmHg  Family History: Mom and dad with HTN.   Social History: Current smoker,3-4 cigarettes today. No alcohol.   Diet:Most meals prepared from home. Cooks with salts but has been trying to avoid since surgery - did have a rotisserie chicken. Drinks 1 cups of coffee per day. Drinks green tea as well.  - Breakfast: oatmeal & whole wheat toast, tablespoon of honey or PB; Lunch: burger & fries; Dinner chicken drumettes (baked), sweet potatoes, broccoli   Exercise:No formal exercise. Walks~45 min over the weekend  Home BP readings: SBP 104-152, DBP 70-102, HR 66-86   Wt Readings from Last 3 Encounters:  03/05/17 136 lb 6.4 oz (61.9 kg)  01/24/17 134 lb (60.8 kg)  01/03/17 135 lb (61.2 kg)   BP Readings from Last 3 Encounters:  03/05/17 110/72  02/08/17 98/68  01/24/17 120/84   Pulse Readings from Last 3 Encounters:  03/05/17 (!) 52  02/08/17 (!) 57  01/24/17 78    Renal function: CrCl cannot be calculated (Patient's most recent lab result is older than the maximum 21 days allowed.).  Past Medical History:  Diagnosis Date  . Abnormal liver function tests   . AICD (automatic cardioverter/defibrillator) present  [employer]  . Anxiety   . Aortic stenosis   . Benign neoplasm of colon 04/19/2001   Hyperplastic  . Bicuspid aortic valve   . Cardiomyopathy secondary   . CHF (congestive heart failure) ([place])   . Coronary artery disease   . Depression   . Headache   . Heart murmur   . HTN (hypertension)   . Hypercalcemia   . Hyperlipidemia   . S/P ICD (internal cardiac defibrillator) procedure     Current Outpatient Prescriptions on File Prior to Visit  Medication Sig Dispense Refill  . acetaminophen (TYLENOL) 325 MG tablet Take 325 mg by mouth every 6 (six) hours as  needed for mild pain.    Marland Kitchen aspirin 325 MG EC tablet Take 1 tablet (325 mg total) by mouth daily. 30 tablet 0  . carvedilol (COREG) 25 MG tablet Take 25 mg by mouth 2 (two) times daily with a meal.     . furosemide (LASIX) 40 MG tablet Take 40 mg by mouth every other day.    . Multiple Vitamin (MULTIVITAMIN WITH MINERALS) TABS tablet Take 1 tablet by mouth at bedtime.    . nicotine (NICODERM CQ - DOSED IN MG/24 HOURS) 14 mg/24hr patch Place 1 patch (14 mg total) onto the skin daily. 28 patch 0  . olmesartan (BENICAR) 40 MG tablet Take 0.5 tablets (20 mg total) by mouth daily.    Vladimir Faster Glycol-Propyl Glycol (SYSTANE OP) Place 1-2 drops into both eyes 3 (three) times daily as needed (for dry eyes.).    Marland Kitchen rosuvastatin (CRESTOR) 10 MG tablet Take 1 tablet (10 mg total) by mouth daily. 90 tablet 3   No current facility-administered medications on file prior to visit.     Allergies  Allergen Reactions  . No Known Allergies      Assessment/Plan:

## 2017-04-04 ENCOUNTER — Ambulatory Visit (INDEPENDENT_AMBULATORY_CARE_PROVIDER_SITE_OTHER): Payer: Medicare HMO | Admitting: *Deleted

## 2017-04-04 ENCOUNTER — Encounter (HOSPITAL_COMMUNITY)
Admission: RE | Admit: 2017-04-04 | Discharge: 2017-04-04 | Disposition: A | Discharge status: Home / Self Care | Payer: Medicare HMO | Source: Ambulatory Visit | Attending: Internal Medicine | Admitting: Internal Medicine

## 2017-04-04 DIAGNOSIS — Z952 Presence of prosthetic heart valve: Secondary | ICD-10-CM | POA: Diagnosis not present

## 2017-04-04 DIAGNOSIS — Z95810 Presence of automatic (implantable) cardiac defibrillator: Secondary | ICD-10-CM

## 2017-04-04 DIAGNOSIS — Z48812 Encounter for surgical aftercare following surgery on the circulatory system: Secondary | ICD-10-CM | POA: Diagnosis not present

## 2017-04-04 DIAGNOSIS — I472 Ventricular tachycardia, unspecified: Secondary | ICD-10-CM

## 2017-04-04 NOTE — Progress Notes (Signed)
Remote ICD transmission.   

## 2017-04-05 LAB — CUP PACEART REMOTE DEVICE CHECK
Battery Remaining Longevity: 54 mo
Battery Remaining Percentage: 84 %
Brady Statistic RA Percent Paced: 0 %
Brady Statistic RV Percent Paced: 100 %
Date Time Interrogation Session: 20180711120000
HighPow Impedance: 54 Ohm
Implantable Lead Implant Date: 20060630
Implantable Lead Implant Date: 20060630
Implantable Lead Implant Date: 20060630
Implantable Lead Location: 753858
Implantable Lead Location: 753859
Implantable Lead Location: 753860
Implantable Lead Model: 157
Implantable Lead Model: 4086
Implantable Lead Model: 4543
Implantable Lead Serial Number: 119717
Implantable Lead Serial Number: 131677
Implantable Lead Serial Number: 223436
Implantable Pulse Generator Implant Date: 20120131
Lead Channel Impedance Value: 1260 Ohm
Lead Channel Impedance Value: 442 Ohm
Lead Channel Impedance Value: 555 Ohm
Lead Channel Pacing Threshold Amplitude: 1 V
Lead Channel Pacing Threshold Amplitude: 1.2 V
Lead Channel Pacing Threshold Amplitude: 1.2 V
Lead Channel Pacing Threshold Pulse Width: 0.4 ms
Lead Channel Pacing Threshold Pulse Width: 0.4 ms
Lead Channel Pacing Threshold Pulse Width: 0.4 ms
Lead Channel Setting Pacing Amplitude: 2 V
Lead Channel Setting Pacing Amplitude: 2.2 V
Lead Channel Setting Pacing Amplitude: 2.4 V
Lead Channel Setting Pacing Pulse Width: 0.4 ms
Lead Channel Setting Pacing Pulse Width: 0.4 ms
Lead Channel Setting Sensing Sensitivity: 0.5 mV
Lead Channel Setting Sensing Sensitivity: 1 mV
Pulse Gen Serial Number: 498164

## 2017-04-06 ENCOUNTER — Encounter (HOSPITAL_COMMUNITY)
Admission: RE | Admit: 2017-04-06 | Discharge: 2017-04-06 | Disposition: A | Discharge status: Home / Self Care | Payer: Medicare HMO | Source: Ambulatory Visit | Attending: Internal Medicine | Admitting: Internal Medicine

## 2017-04-06 DIAGNOSIS — Z952 Presence of prosthetic heart valve: Secondary | ICD-10-CM | POA: Diagnosis not present

## 2017-04-06 DIAGNOSIS — Z95810 Presence of automatic (implantable) cardiac defibrillator: Secondary | ICD-10-CM | POA: Diagnosis not present

## 2017-04-06 DIAGNOSIS — Z48812 Encounter for surgical aftercare following surgery on the circulatory system: Secondary | ICD-10-CM | POA: Diagnosis not present

## 2017-04-09 ENCOUNTER — Encounter (HOSPITAL_COMMUNITY)
Admission: RE | Admit: 2017-04-09 | Discharge: 2017-04-09 | Disposition: A | Discharge status: Home / Self Care | Payer: Medicare HMO | Source: Ambulatory Visit | Attending: Internal Medicine | Admitting: Internal Medicine

## 2017-04-09 DIAGNOSIS — Z95810 Presence of automatic (implantable) cardiac defibrillator: Secondary | ICD-10-CM

## 2017-04-09 DIAGNOSIS — Z952 Presence of prosthetic heart valve: Secondary | ICD-10-CM | POA: Diagnosis not present

## 2017-04-09 DIAGNOSIS — Z48812 Encounter for surgical aftercare following surgery on the circulatory system: Secondary | ICD-10-CM | POA: Diagnosis not present

## 2017-04-09 NOTE — Progress Notes (Signed)
Cardiac Individual Treatment Plan  Patient Details  Name: [name] MRN: [id number] Date of Birth: November 14, 1955 Referring Provider:     [facility] from 01/29/2017 in [place]  Referring Provider  Dr Darlina Guys      Initial Encounter Date:    [facility] from 01/29/2017 in [place]  Date  01/29/17  Referring Provider  Dr Darlina Guys      Visit Diagnosis: 11/16/16 S/P AVR (aortic valve replacement)  ICD (implantable cardioverter-defibrillator) in place  Patient's Home Medications on Admission:  Current Outpatient Prescriptions:  .  acetaminophen (TYLENOL) 325 MG tablet, Take 325 mg by mouth every 6 (six) hours as needed for mild pain., Disp: , Rfl:  .  aspirin 325 MG EC tablet, Take 1 tablet (325 mg total) by mouth daily., Disp: 30 tablet, Rfl: 0 .  carvedilol (COREG) 25 MG tablet, Take 25 mg by mouth 2 (two) times daily with a meal. , Disp: , Rfl:  .  furosemide (LASIX) 40 MG tablet, Take 40 mg by mouth every other day., Disp: , Rfl:  .  Multiple Vitamin (MULTIVITAMIN WITH MINERALS) TABS tablet, Take 1 tablet by mouth at bedtime., Disp: , Rfl:  .  nicotine (NICODERM CQ - DOSED IN MG/24 HOURS) 14 mg/24hr patch, Place 1 patch (14 mg total) onto the skin daily., Disp: 28 patch, Rfl: 0 .  olmesartan (BENICAR) 40 MG tablet, Take 0.5 tablets (20 mg total) by mouth daily., Disp: , Rfl:  .  Polyethyl Glycol-Propyl Glycol (SYSTANE OP), Place 1-2 drops into both eyes 3 (three) times daily as needed (for dry eyes.)., Disp: , Rfl:  .  rosuvastatin (CRESTOR) 10 MG tablet, Take 1 tablet (10 mg total) by mouth daily., Disp: 90 tablet, Rfl: 3  Past Medical History: Past Medical History:  Diagnosis Date  . Abnormal liver function tests   . AICD (automatic cardioverter/defibrillator) present    [employer]  . Anxiety   . Aortic stenosis   . Benign neoplasm of colon  04/19/2001   Hyperplastic  . Bicuspid aortic valve   . Cardiomyopathy secondary   . CHF (congestive heart failure) ([facility])   . Coronary artery disease   . Depression   . Headache   . Heart murmur   . HTN (hypertension)   . Hypercalcemia   . Hyperlipidemia   . S/P ICD (internal cardiac defibrillator) procedure     Tobacco Use: History  Smoking Status  . Former Smoker  . Packs/day: 0.25  . Years: 40.00  . Types: Cigarettes  . Quit date: 09/25/2010  Smokeless Tobacco  . Never Used    Comment: patient says she smokes 4 cigarettes a day    Labs: Recent Review Flowsheet Data    Labs for ITP Cardiac and Pulmonary Rehab Latest Ref Rng & Units 11/16/2016 11/16/2016 11/16/2016 11/16/2016 11/17/2016   Hemoglobin A1c 4.8 - 5.6 % - - - - -   PHART 7.350 - 7.450 7.433 - 7.339(L) 7.348(L) -   PCO2ART 32.0 - 48.0 mmHg 29.9(L) - 37.7 36.6 -   HCO3 20.0 - 28.0 mmol/L 20.5 - 20.4 20.3 -   TCO2 0 - 100 mmol/L 21 24 22 21 26    ACIDBASEDEF 0.0 - 2.0 mmol/L 4.0(H) - 5.0(H) 5.0(H) -   O2SAT % 98.0 - 98.0 95.0 -      Capillary Blood Glucose: Lab Results  Component Value Date   GLUCAP 108 (  H) 11/17/2016   GLUCAP 114 (H) 11/17/2016   GLUCAP 109 (H) 11/16/2016   GLUCAP 104 (H) 11/16/2016   GLUCAP 100 (H) 11/16/2016     Exercise Target Goals:    Exercise Program Goal: Individual exercise prescription set with THRR, safety & activity barriers. Participant demonstrates ability to understand and report RPE using BORG scale, to self-measure pulse accurately, and to acknowledge the importance of the exercise prescription.  Exercise Prescription Goal: Starting with aerobic activity 30 plus minutes a day, 3 days per week for initial exercise prescription. Provide home exercise prescription and guidelines that participant acknowledges understanding prior to discharge.  Activity Barriers & Risk Stratification:     Activity Barriers & Cardiac Risk Stratification - 01/18/17 0843      Activity  Barriers & Cardiac Risk Stratification   Activity Barriers Deconditioning;Muscular Weakness   Cardiac Risk Stratification High      6 Minute Walk:     6 Minute Walk    Row Name 01/29/17 1155         6 Minute Walk   Phase Initial     Distance 1462 feet     Walk Time 6 minutes     # of Rest Breaks 0     MPH 2.8     METS 3.9     RPE 9     VO2 Peak 13.7     Symptoms No     Resting HR 78 bpm     Resting BP 144/90     Max Ex. HR 106 bpm     Max Ex. BP 150/90     2 Minute Post BP 132/90        Oxygen Initial Assessment:   Oxygen Re-Evaluation:   Oxygen Discharge (Final Oxygen Re-Evaluation):   Initial Exercise Prescription:     Initial Exercise Prescription - 01/29/17 1100      Date of Initial Exercise RX and Referring Provider   Date 01/29/17   Referring Provider Dr Darlina Guys     Recumbant Bike   Level 1.5   Minutes 10   METs 3     NuStep   Level 2   SPM 85   Minutes 10   METs 2.1     Track   Laps 10   Minutes 10   METs 2.74     Prescription Details   Frequency (times per week) 5   Duration Progress to 45 minutes of aerobic exercise without signs/symptoms of physical distress     Intensity   THRR 40-80% of Max Heartrate 63-126   Ratings of Perceived Exertion 11-15   Perceived Dyspnea 0-4     Progression   Progression Continue to progress workloads to maintain intensity without signs/symptoms of physical distress.     Resistance Training   Training Prescription Yes   Weight 2   Reps 10-15      Perform Capillary Blood Glucose checks as needed.  Exercise Prescription Changes:      Exercise Prescription Changes    Row Name 03/14/17 1200 04/05/17 1500           Response to Exercise   Blood Pressure (Admit) 134/80 109/72      Blood Pressure (Exercise) 132/82 90/62      Blood Pressure (Exit) 108/72 122/84      Heart Rate (Admit) 78 bpm 76 bpm      Heart Rate (Exercise) 95 bpm 99 bpm      Heart Rate (Exit) 64 bpm 74 bpm  Rating of Perceived Exertion (Exercise) 9 11      Duration Progress to 45 minutes of aerobic exercise without signs/symptoms of physical distress Progress to 45 minutes of aerobic exercise without signs/symptoms of physical distress      Intensity THRR unchanged THRR unchanged        Progression   Progression Continue to progress workloads to maintain intensity without signs/symptoms of physical distress. Continue to progress workloads to maintain intensity without signs/symptoms of physical distress.      Average METs 2 2.2        Resistance Training   Training Prescription Yes Yes      Weight 2 2      Reps 10-15 10-15        Recumbant Bike   Level 2 2      Minutes 10 10      METs 2.9 2        NuStep   Level 3 3      SPM 85 85      Minutes 10 10      METs 1.5 2        Track   Laps 12 7      Minutes 10 10      METs 3.09 2.4        Home Exercise Plan   Plans to continue exercise at Home (comment) Home (comment)      Frequency Add 3 additional days to program exercise sessions. Add 3 additional days to program exercise sessions.      Initial Home Exercises Provided 02/28/17 02/28/17         Exercise Comments:      Exercise Comments    Row Name 02/14/17 1117 03/14/17 1119 04/05/17 1526       Exercise Comments Pt is off to a good start with exercise! Reviewed METs and goals with pt.  reviewed goals with pt.          Exercise Goals and Review:      Exercise Goals    Row Name 01/18/17 0843             Exercise Goals   Increase Physical Activity Yes       Intervention Provide advice, education, support and counseling about physical activity/exercise needs.;Develop an individualized exercise prescription for aerobic and resistive training based on initial evaluation findings, risk stratification, comorbidities and participant's personal goals.       Expected Outcomes Achievement of increased cardiorespiratory fitness and enhanced flexibility, muscular  endurance and strength shown through measurements of functional capacity and personal statement of participant.       Increase Strength and Stamina Yes  increase energy and activity levels; increase muscle tone and flatten belly region       Intervention Provide advice, education, support and counseling about physical activity/exercise needs.;Develop an individualized exercise prescription for aerobic and resistive training based on initial evaluation findings, risk stratification, comorbidities and participant's personal goals.       Expected Outcomes Achievement of increased cardiorespiratory fitness and enhanced flexibility, muscular endurance and strength shown through measurements of functional capacity and personal statement of participant.          Exercise Goals Re-Evaluation :     Exercise Goals Re-Evaluation    Row Name 03/14/17 1117 04/05/17 1521           Exercise Goal Re-Evaluation   Exercise Goals Review Increase Physical Activity;Increase Strenth and Stamina Increase Physical Activity;Increase Strenth and Stamina  Comments Pt is doing well with exercise and states that she is doing some walking at home on her off days from CR Pt continues to do well with exericse and states she feels stronger and overall she feels much better than she did prior to beginning an exercise program.      Expected Outcomes Continue with exercise Rx and increase workloads as tolerated in order to increase exercise tolerance Continue with exercise Rx and increase workloads as tolerated in order to increase exercise tolerance          Discharge Exercise Prescription (Final Exercise Prescription Changes):     Exercise Prescription Changes - 04/05/17 1500      Response to Exercise   Blood Pressure (Admit) 109/72   Blood Pressure (Exercise) 90/62   Blood Pressure (Exit) 122/84   Heart Rate (Admit) 76 bpm   Heart Rate (Exercise) 99 bpm   Heart Rate (Exit) 74 bpm   Rating of Perceived  Exertion (Exercise) 11   Duration Progress to 45 minutes of aerobic exercise without signs/symptoms of physical distress   Intensity THRR unchanged     Progression   Progression Continue to progress workloads to maintain intensity without signs/symptoms of physical distress.   Average METs 2.2     Resistance Training   Training Prescription Yes   Weight 2   Reps 10-15     Recumbant Bike   Level 2   Minutes 10   METs 2     NuStep   Level 3   SPM 85   Minutes 10   METs 2     Track   Laps 7   Minutes 10   METs 2.4     Home Exercise Plan   Plans to continue exercise at Home (comment)   Frequency Add 3 additional days to program exercise sessions.   Initial Home Exercises Provided 02/28/17      Nutrition:  Target Goals: Understanding of nutrition guidelines, daily intake of sodium 1500mg , cholesterol 200mg , calories 30% from fat and 7% or less from saturated fats, daily to have 5 or more servings of fruits and vegetables.  Biometrics:     Pre Biometrics - 01/29/17 1205      Pre Biometrics   Height 5' 3.5" (1.613 m)       Nutrition Therapy Plan and Nutrition Goals:     Nutrition Therapy & Goals - 02/28/17 1112      Nutrition Therapy   Diet Therapeutic Lifestyle Changes     Personal Nutrition Goals   Nutrition Goal Pt to identify and limit food intake of saturated fat, trans fat, and sodium     Intervention Plan   Intervention Prescribe, educate and counsel regarding individualized specific dietary modifications aiming towards targeted core components such as weight, hypertension, lipid management, diabetes, heart failure and other comorbidities.   Expected Outcomes Short Term Goal: Understand basic principles of dietary content, such as calories, fat, sodium, cholesterol and nutrients.;Long Term Goal: Adherence to prescribed nutrition plan.      Nutrition Discharge: Nutrition Scores:     Nutrition Assessments - 02/28/17 1112      MEDFICTS Scores    Pre Score 60      Nutrition Goals Re-Evaluation:   Nutrition Goals Re-Evaluation:   Nutrition Goals Discharge (Final Nutrition Goals Re-Evaluation):   Psychosocial: Target Goals: Acknowledge presence or absence of significant depression and/or stress, maximize coping skills, provide positive support system. Participant is able to verbalize types and ability to use techniques and  skills needed for reducing stress and depression.  Initial Review & Psychosocial Screening:     Initial Psych Review & Screening - 01/29/17 1749      Initial Review   Current issues with History of Depression;Current Depression     Family Dynamics   Good Support System? Yes   Concerns Inappropriate over/under dependence on family/friends   Comments Dior has a an adult son and a friend she can rely on     Barriers   Psychosocial barriers to participate in program The patient should benefit from training in stress management and relaxation.     Screening Interventions   Interventions Yes;To provide support and resources with identified psychosocial needs;Other (comment)   Comments Appointment made for Ms Sobczak to follow up with her primary care physcian Dr Burnadette Pop tomoorw at 0800   Expected Outcomes Short Term goal: Utilizing psychosocial counselor, staff and physician to assist with identification of specific Stressors or current issues interfering with healing process. Setting desired goal for each stressor or current issue identified.;Long Term Goal: Stressors or current issues are controlled or eliminated.;Short Term goal: Identification and review with participant of any Quality of Life or Depression concerns found by scoring the questionnaire.;Long Term goal: The participant improves quality of Life and PHQ9 Scores as seen by post scores and/or verbalization of changes      Quality of Life Scores:     Quality of Life - 01/18/17 1442      Quality of Life Scores   Health/Function Pre  22.27 %   Socioeconomic Pre 20.75 %   Psych/Spiritual Pre 14.57 %   Family Pre 10.6 %   GLOBAL Pre 18.59 %      PHQ-9: Recent Review Flowsheet Data    Depression screen [facility] 2/9 01/29/2017 01/29/2017   Decreased Interest - 0   Down, Depressed, Hopeless (No Data)  1   PHQ - 2 Score - 1     Interpretation of Total Score  Total Score Depression Severity:  1-4 = Minimal depression, 5-9 = Mild depression, 10-14 = Moderate depression, 15-19 = Moderately severe depression, 20-27 = Severe depression   Psychosocial Evaluation and Intervention:   Psychosocial Re-Evaluation:     Psychosocial Re-Evaluation    [place] Name 03/13/17 1224 04/09/17 1254           Psychosocial Re-Evaluation   Current issues with None Identified None Identified      Interventions Encouraged to attend Cardiac Rehabilitation for the exercise Encouraged to attend Cardiac Rehabilitation for the exercise      Continue Psychosocial Services  No Follow up required No Follow up required         Psychosocial Discharge (Final Psychosocial Re-Evaluation):     Psychosocial Re-Evaluation - 04/09/17 1254      Psychosocial Re-Evaluation   Current issues with None Identified   Interventions Encouraged to attend Cardiac Rehabilitation for the exercise   Continue Psychosocial Services  No Follow up required      Vocational Rehabilitation: Provide vocational rehab assistance to qualifying candidates.   Vocational Rehab Evaluation & Intervention:     Vocational Rehab - 01/29/17 1746      Initial Vocational Rehab Evaluation & Intervention   Assessment shows need for Vocational Rehabilitation No  Ms Monroy disabled and is not interested in vocational rehab at this time.      Education: Education Goals: Education classes will be provided on a weekly basis, covering required topics. Participant will state understanding/return demonstration of topics presented.  Learning Barriers/Preferences:     Learning  Barriers/Preferences - 01/18/17 0842      Learning Barriers/Preferences   Learning Barriers Sight   Learning Preferences Skilled Demonstration      Education Topics: Count Your Pulse:  -Group instruction provided by verbal instruction, demonstration, patient participation and written materials to support subject.  Instructors address importance of being able to find your pulse and how to count your pulse when at home without a heart monitor.  Patients get hands on experience counting their pulse with staff help and individually.   Heart Attack, Angina, and Risk Factor Modification:  -Group instruction provided by verbal instruction, video, and written materials to support subject.  Instructors address signs and symptoms of angina and heart attacks.    Also discuss risk factors for heart disease and how to make changes to improve heart health risk factors.   Functional Fitness:  -Group instruction provided by verbal instruction, demonstration, patient participation, and written materials to support subject.  Instructors address safety measures for doing things around the house.  Discuss how to get up and down off the floor, how to pick things up properly, how to safely get out of a chair without assistance, and balance training.   Meditation and Mindfulness:  -Group instruction provided by verbal instruction, patient participation, and written materials to support subject.  Instructor addresses importance of mindfulness and meditation practice to help reduce stress and improve awareness.  Instructor also leads participants through a meditation exercise.    Stretching for Flexibility and Mobility:  -Group instruction provided by verbal instruction, patient participation, and written materials to support subject.  Instructors lead participants through series of stretches that are designed to increase flexibility thus improving mobility.  These stretches are additional exercise for major muscle  groups that are typically performed during regular warm up and cool down.   Hands Only CPR:  -Group verbal, video, and participation provides a basic overview of AHA guidelines for community CPR. Role-play of emergencies allow participants the opportunity to practice calling for help and chest compression technique with discussion of AED use.   Hypertension: -Group verbal and written instruction that provides a basic overview of hypertension including the most recent diagnostic guidelines, risk factor reduction with self-care instructions and medication management.    Nutrition I class: Heart Healthy Eating:  -Group instruction provided by PowerPoint slides, verbal discussion, and written materials to support subject matter. The instructor gives an explanation and review of the Therapeutic Lifestyle Changes diet recommendations, which includes a discussion on lipid goals, dietary fat, sodium, fiber, plant stanol/sterol esters, sugar, and the components of a well-balanced, healthy diet.   Nutrition II class: Lifestyle Skills:  -Group instruction provided by PowerPoint slides, verbal discussion, and written materials to support subject matter. The instructor gives an explanation and review of label reading, grocery shopping for heart health, heart healthy recipe modifications, and ways to make healthier choices when eating out.   Diabetes Question & Answer:  -Group instruction provided by PowerPoint slides, verbal discussion, and written materials to support subject matter. The instructor gives an explanation and review of diabetes co-morbidities, pre- and post-prandial blood glucose goals, pre-exercise blood glucose goals, signs, symptoms, and treatment of hypoglycemia and hyperglycemia, and foot care basics.   Diabetes Blitz:  -Group instruction provided by PowerPoint slides, verbal discussion, and written materials to support subject matter. The instructor gives an explanation and review of  the physiology behind type 1 and type 2 diabetes, diabetes medications and rational behind using  different medications, pre- and post-prandial blood glucose recommendations and Hemoglobin A1c goals, diabetes diet, and exercise including blood glucose guidelines for exercising safely.    Portion Distortion:  -Group instruction provided by PowerPoint slides, verbal discussion, written materials, and food models to support subject matter. The instructor gives an explanation of serving size versus portion size, changes in portions sizes over the last 20 years, and what consists of a serving from each food group.   Stress Management:  -Group instruction provided by verbal instruction, video, and written materials to support subject matter.  Instructors review role of stress in heart disease and how to cope with stress positively.     Exercising on Your Own:  -Group instruction provided by verbal instruction, power point, and written materials to support subject.  Instructors discuss benefits of exercise, components of exercise, frequency and intensity of exercise, and end points for exercise.  Also discuss use of nitroglycerin and activating EMS.  Review options of places to exercise outside of rehab.  Review guidelines for sex with heart disease.   Cardiac Drugs I:  -Group instruction provided by verbal instruction and written materials to support subject.  Instructor reviews cardiac drug classes: antiplatelets, anticoagulants, beta blockers, and statins.  Instructor discusses reasons, side effects, and lifestyle considerations for each drug class.   Cardiac Drugs II:  -Group instruction provided by verbal instruction and written materials to support subject.  Instructor reviews cardiac drug classes: angiotensin converting enzyme inhibitors (ACE-I), angiotensin II receptor blockers (ARBs), nitrates, and calcium channel blockers.  Instructor discusses reasons, side effects, and lifestyle  considerations for each drug class.   Anatomy and Physiology of the Circulatory System:  Group verbal and written instruction and models provide basic cardiac anatomy and physiology, with the coronary electrical and arterial systems. Review of: AMI, Angina, Valve disease, Heart Failure, Peripheral Artery Disease, Cardiac Arrhythmia, Pacemakers, and the ICD.   Other Education:  -Group or individual verbal, written, or video instructions that support the educational goals of the cardiac rehab program.   Knowledge Questionnaire Score:     Knowledge Questionnaire Score - 01/18/17 1437      Knowledge Questionnaire Score   Pre Score 25/28      Core Components/Risk Factors/Patient Goals at Admission:     Personal Goals and Risk Factors at Admission - 01/29/17 1819      Core Components/Risk Factors/Patient Goals on Admission   Tobacco Cessation Yes   Intervention Assist the participant in steps to quit. Provide individualized education and counseling about committing to Tobacco Cessation, relapse prevention, and pharmacological support that can be provided by physician.;Advice worker, assist with locating and accessing local/national Quit Smoking programs, and support quit date choice.   Expected Outcomes Short Term: Will demonstrate readiness to quit, by selecting a quit date.;Short Term: Will quit all tobacco product use, adhering to prevention of relapse plan.;Long Term: Complete abstinence from all tobacco products for at least 12 months from quit date.   Hypertension Yes   Intervention Provide education on lifestyle modifcations including regular physical activity/exercise, weight management, moderate sodium restriction and increased consumption of fresh fruit, vegetables, and low fat dairy, alcohol moderation, and smoking cessation.;Monitor prescription use compliance.   Expected Outcomes Short Term: Continued assessment and intervention until BP is < 140/40mm HG in  hypertensive participants. < 130/58mm HG in hypertensive participants with diabetes, heart failure or chronic kidney disease.;Long Term: Maintenance of blood pressure at goal levels.   Stress Yes   Intervention Offer individual and/or small group  education and counseling on adjustment to heart disease, stress management and health-related lifestyle change. Teach and support self-help strategies.;Refer participants experiencing significant psychosocial distress to appropriate mental health specialists for further evaluation and treatment. When possible, include family members and significant others in education/counseling sessions.   Expected Outcomes Short Term: Participant demonstrates changes in health-related behavior, relaxation and other stress management skills, ability to obtain effective social support, and compliance with psychotropic medications if prescribed.;Long Term: Emotional wellbeing is indicated by absence of clinically significant psychosocial distress or social isolation.   Personal Goal Other Yes   Personal Goal Patient will increase energy and activity levels.      Core Components/Risk Factors/Patient Goals Review:    Core Components/Risk Factors/Patient Goals at Discharge (Final Review):    ITP Comments:     ITP Comments    Row Name 01/18/17 0840           ITP Comments Medical Director, Dr. Fransico Him          Comments: Dianah is making expected progress toward personal goals after completing 18 sessions. Recommend continued exercise and life style modification education including  stress management and relaxation techniques to decrease cardiac risk profile. Pema continues to do well with exercise. Adalae says she now smoking 4 to 5 cigarettes at day. Will continue to encourage smoking cessation. Naydelin's blood pressures have been well controlled. Barnet Pall, RN,BSN 04/10/2017 1:54 PM

## 2017-04-10 ENCOUNTER — Encounter: Payer: Self-pay | Admitting: Cardiology

## 2017-04-10 ENCOUNTER — Telehealth: Payer: Self-pay | Admitting: Internal Medicine

## 2017-04-10 NOTE — Telephone Encounter (Signed)
New message    Pt is calling stating that the lights on her home monitor went off last night. She is asking for a call back.

## 2017-04-10 NOTE — Telephone Encounter (Signed)
Spoke w/ pt and informed her that her monitor may have been completing a software update b/c the last remote transmission received was 04-04-2017. Pt verbalized understanding.

## 2017-04-10 NOTE — Telephone Encounter (Signed)
LMOVM for pt to return call 

## 2017-04-11 ENCOUNTER — Encounter (HOSPITAL_COMMUNITY): Payer: Medicare HMO

## 2017-04-13 ENCOUNTER — Encounter (HOSPITAL_COMMUNITY): Payer: Medicare HMO

## 2017-04-16 ENCOUNTER — Encounter (HOSPITAL_COMMUNITY)
Admission: RE | Admit: 2017-04-16 | Discharge: 2017-04-16 | Disposition: A | Discharge status: Home / Self Care | Payer: Medicare HMO | Source: Ambulatory Visit | Attending: Internal Medicine | Admitting: Internal Medicine

## 2017-04-16 DIAGNOSIS — Z952 Presence of prosthetic heart valve: Secondary | ICD-10-CM

## 2017-04-16 DIAGNOSIS — Z48812 Encounter for surgical aftercare following surgery on the circulatory system: Secondary | ICD-10-CM | POA: Diagnosis not present

## 2017-04-16 DIAGNOSIS — Z95810 Presence of automatic (implantable) cardiac defibrillator: Secondary | ICD-10-CM | POA: Diagnosis not present

## 2017-04-18 ENCOUNTER — Encounter (HOSPITAL_COMMUNITY): Payer: Medicare HMO

## 2017-04-20 ENCOUNTER — Encounter (HOSPITAL_COMMUNITY): Admission: RE | Admit: 2017-04-20 | Payer: Medicare HMO | Source: Ambulatory Visit

## 2017-04-23 ENCOUNTER — Encounter (HOSPITAL_COMMUNITY)
Admission: RE | Admit: 2017-04-23 | Discharge: 2017-04-23 | Disposition: A | Discharge status: Home / Self Care | Payer: Medicare HMO | Source: Ambulatory Visit | Attending: Internal Medicine | Admitting: Internal Medicine

## 2017-04-23 DIAGNOSIS — Z952 Presence of prosthetic heart valve: Secondary | ICD-10-CM | POA: Diagnosis not present

## 2017-04-23 DIAGNOSIS — Z48812 Encounter for surgical aftercare following surgery on the circulatory system: Secondary | ICD-10-CM | POA: Diagnosis not present

## 2017-04-23 DIAGNOSIS — Z95810 Presence of automatic (implantable) cardiac defibrillator: Secondary | ICD-10-CM | POA: Diagnosis not present

## 2017-04-25 ENCOUNTER — Encounter (HOSPITAL_COMMUNITY)
Admission: RE | Admit: 2017-04-25 | Discharge: 2017-04-25 | Disposition: A | Discharge status: Home / Self Care | Payer: Medicare HMO | Source: Ambulatory Visit | Attending: Internal Medicine | Admitting: Internal Medicine

## 2017-04-25 DIAGNOSIS — Z48812 Encounter for surgical aftercare following surgery on the circulatory system: Secondary | ICD-10-CM | POA: Diagnosis not present

## 2017-04-25 DIAGNOSIS — Z95810 Presence of automatic (implantable) cardiac defibrillator: Secondary | ICD-10-CM | POA: Insufficient documentation

## 2017-04-25 DIAGNOSIS — Z952 Presence of prosthetic heart valve: Secondary | ICD-10-CM | POA: Insufficient documentation

## 2017-04-27 ENCOUNTER — Encounter (HOSPITAL_COMMUNITY)
Admission: RE | Admit: 2017-04-27 | Discharge: 2017-04-27 | Disposition: A | Discharge status: Home / Self Care | Payer: Medicare HMO | Source: Ambulatory Visit | Attending: Internal Medicine | Admitting: Internal Medicine

## 2017-04-27 DIAGNOSIS — Z952 Presence of prosthetic heart valve: Secondary | ICD-10-CM

## 2017-04-27 DIAGNOSIS — F321 Major depressive disorder, single episode, moderate: Secondary | ICD-10-CM | POA: Diagnosis not present

## 2017-04-27 DIAGNOSIS — I5022 Chronic systolic (congestive) heart failure: Secondary | ICD-10-CM | POA: Diagnosis not present

## 2017-04-27 DIAGNOSIS — Z95810 Presence of automatic (implantable) cardiac defibrillator: Secondary | ICD-10-CM | POA: Diagnosis not present

## 2017-04-27 DIAGNOSIS — F411 Generalized anxiety disorder: Secondary | ICD-10-CM | POA: Diagnosis not present

## 2017-04-27 DIAGNOSIS — Z79899 Other long term (current) drug therapy: Secondary | ICD-10-CM | POA: Diagnosis not present

## 2017-04-27 DIAGNOSIS — Z48812 Encounter for surgical aftercare following surgery on the circulatory system: Secondary | ICD-10-CM | POA: Diagnosis not present

## 2017-04-27 DIAGNOSIS — I10 Essential (primary) hypertension: Secondary | ICD-10-CM | POA: Diagnosis not present

## 2017-04-30 ENCOUNTER — Encounter (HOSPITAL_COMMUNITY)
Admission: RE | Admit: 2017-04-30 | Discharge: 2017-04-30 | Disposition: A | Discharge status: Home / Self Care | Payer: Medicare HMO | Source: Ambulatory Visit | Attending: Internal Medicine | Admitting: Internal Medicine

## 2017-04-30 DIAGNOSIS — Z952 Presence of prosthetic heart valve: Secondary | ICD-10-CM

## 2017-04-30 DIAGNOSIS — Z95810 Presence of automatic (implantable) cardiac defibrillator: Secondary | ICD-10-CM

## 2017-04-30 DIAGNOSIS — Z48812 Encounter for surgical aftercare following surgery on the circulatory system: Secondary | ICD-10-CM | POA: Diagnosis not present

## 2017-05-02 ENCOUNTER — Encounter (HOSPITAL_COMMUNITY)
Admission: RE | Admit: 2017-05-02 | Discharge: 2017-05-02 | Disposition: A | Discharge status: Home / Self Care | Payer: Medicare HMO | Source: Ambulatory Visit | Attending: Internal Medicine | Admitting: Internal Medicine

## 2017-05-02 DIAGNOSIS — Z95810 Presence of automatic (implantable) cardiac defibrillator: Secondary | ICD-10-CM | POA: Diagnosis not present

## 2017-05-02 DIAGNOSIS — Z48812 Encounter for surgical aftercare following surgery on the circulatory system: Secondary | ICD-10-CM | POA: Diagnosis not present

## 2017-05-02 DIAGNOSIS — Z952 Presence of prosthetic heart valve: Secondary | ICD-10-CM

## 2017-05-04 ENCOUNTER — Encounter (HOSPITAL_COMMUNITY)
Admission: RE | Admit: 2017-05-04 | Discharge: 2017-05-04 | Disposition: A | Discharge status: Home / Self Care | Payer: Medicare HMO | Source: Ambulatory Visit | Attending: Internal Medicine | Admitting: Internal Medicine

## 2017-05-04 DIAGNOSIS — Z952 Presence of prosthetic heart valve: Secondary | ICD-10-CM | POA: Diagnosis not present

## 2017-05-04 DIAGNOSIS — Z95810 Presence of automatic (implantable) cardiac defibrillator: Secondary | ICD-10-CM | POA: Diagnosis not present

## 2017-05-04 DIAGNOSIS — Z48812 Encounter for surgical aftercare following surgery on the circulatory system: Secondary | ICD-10-CM | POA: Diagnosis not present

## 2017-05-07 ENCOUNTER — Encounter (HOSPITAL_COMMUNITY)
Admission: RE | Admit: 2017-05-07 | Discharge: 2017-05-07 | Disposition: A | Discharge status: Home / Self Care | Payer: Medicare HMO | Source: Ambulatory Visit | Attending: Internal Medicine | Admitting: Internal Medicine

## 2017-05-07 DIAGNOSIS — Z952 Presence of prosthetic heart valve: Secondary | ICD-10-CM

## 2017-05-07 DIAGNOSIS — Z95810 Presence of automatic (implantable) cardiac defibrillator: Secondary | ICD-10-CM | POA: Diagnosis not present

## 2017-05-07 DIAGNOSIS — Z48812 Encounter for surgical aftercare following surgery on the circulatory system: Secondary | ICD-10-CM | POA: Diagnosis not present

## 2017-05-09 ENCOUNTER — Encounter (HOSPITAL_COMMUNITY)
Admission: RE | Admit: 2017-05-09 | Discharge: 2017-05-09 | Disposition: A | Discharge status: Home / Self Care | Payer: Medicare HMO | Source: Ambulatory Visit | Attending: Internal Medicine | Admitting: Internal Medicine

## 2017-05-09 DIAGNOSIS — Z952 Presence of prosthetic heart valve: Secondary | ICD-10-CM

## 2017-05-09 DIAGNOSIS — Z48812 Encounter for surgical aftercare following surgery on the circulatory system: Secondary | ICD-10-CM | POA: Diagnosis not present

## 2017-05-09 DIAGNOSIS — Z95810 Presence of automatic (implantable) cardiac defibrillator: Secondary | ICD-10-CM

## 2017-05-09 NOTE — Progress Notes (Signed)
Cardiac Individual Treatment Plan  Patient Details  Name: [name] MRN: [id number] Date of Birth: 10/18/1955 Referring Provider:     [place] from 01/29/2017 in [facility] [facility]  Referring Provider  Dr Darlina Guys      Initial Encounter Date:    [place] from 01/29/2017 in [facility]  Date  01/29/17  Referring Provider  Dr Darlina Guys      Visit Diagnosis: ICD (implantable cardioverter-defibrillator) in place  11/16/16 S/P AVR (aortic valve replacement)  Patient's Home Medications on Admission:  Current Outpatient Prescriptions:  .  acetaminophen (TYLENOL) 325 MG tablet, Take 325 mg by mouth every 6 (six) hours as needed for mild pain., Disp: , Rfl:  .  aspirin 325 MG EC tablet, Take 1 tablet (325 mg total) by mouth daily., Disp: 30 tablet, Rfl: 0 .  carvedilol (COREG) 25 MG tablet, Take 25 mg by mouth 2 (two) times daily with a meal. , Disp: , Rfl:  .  furosemide (LASIX) 40 MG tablet, Take 40 mg by mouth every other day., Disp: , Rfl:  .  Multiple Vitamin (MULTIVITAMIN WITH MINERALS) TABS tablet, Take 1 tablet by mouth at bedtime., Disp: , Rfl:  .  nicotine (NICODERM CQ - DOSED IN MG/24 HOURS) 14 mg/24hr patch, Place 1 patch (14 mg total) onto the skin daily., Disp: 28 patch, Rfl: 0 .  olmesartan (BENICAR) 40 MG tablet, Take 0.5 tablets (20 mg total) by mouth daily., Disp: , Rfl:  .  Polyethyl Glycol-Propyl Glycol (SYSTANE OP), Place 1-2 drops into both eyes 3 (three) times daily as needed (for dry eyes.)., Disp: , Rfl:  .  rosuvastatin (CRESTOR) 10 MG tablet, Take 1 tablet (10 mg total) by mouth daily., Disp: 90 tablet, Rfl: 3  Past Medical History: Past Medical History:  Diagnosis Date  . Abnormal liver function tests   . AICD (automatic cardioverter/defibrillator) present    [employer]  . Anxiety   . Aortic stenosis   . Benign neoplasm of colon  04/19/2001   Hyperplastic  . Bicuspid aortic valve   . Cardiomyopathy secondary   . CHF (congestive heart failure) ([place])   . Coronary artery disease   . Depression   . Headache   . Heart murmur   . HTN (hypertension)   . Hypercalcemia   . Hyperlipidemia   . S/P ICD (internal cardiac defibrillator) procedure     Tobacco Use: History  Smoking Status  . Former Smoker  . Packs/day: 0.25  . Years: 40.00  . Types: Cigarettes  . Quit date: 09/25/2010  Smokeless Tobacco  . Never Used    Comment: patient says she smokes 4 cigarettes a day    Labs: Recent Review Flowsheet Data    Labs for ITP Cardiac and Pulmonary Rehab Latest Ref Rng & Units 11/16/2016 11/16/2016 11/16/2016 11/16/2016 11/17/2016   Hemoglobin A1c 4.8 - 5.6 % - - - - -   PHART 7.350 - 7.450 7.433 - 7.339(L) 7.348(L) -   PCO2ART 32.0 - 48.0 mmHg 29.9(L) - 37.7 36.6 -   HCO3 20.0 - 28.0 mmol/L 20.5 - 20.4 20.3 -   TCO2 0 - 100 mmol/L 21 24 22 21 26    ACIDBASEDEF 0.0 - 2.0 mmol/L 4.0(H) - 5.0(H) 5.0(H) -   O2SAT % 98.0 - 98.0 95.0 -      Capillary Blood Glucose: Lab Results  Component Value Date   GLUCAP 108 (  H) 11/17/2016   GLUCAP 114 (H) 11/17/2016   GLUCAP 109 (H) 11/16/2016   GLUCAP 104 (H) 11/16/2016   GLUCAP 100 (H) 11/16/2016     Exercise Target Goals:    Exercise Program Goal: Individual exercise prescription set with THRR, safety & activity barriers. Participant demonstrates ability to understand and report RPE using BORG scale, to self-measure pulse accurately, and to acknowledge the importance of the exercise prescription.  Exercise Prescription Goal: Starting with aerobic activity 30 plus minutes a day, 3 days per week for initial exercise prescription. Provide home exercise prescription and guidelines that participant acknowledges understanding prior to discharge.  Activity Barriers & Risk Stratification:     Activity Barriers & Cardiac Risk Stratification - 01/18/17 0843      Activity  Barriers & Cardiac Risk Stratification   Activity Barriers Deconditioning;Muscular Weakness   Cardiac Risk Stratification High      6 Minute Walk:     6 Minute Walk    Row Name 01/29/17 1155         6 Minute Walk   Phase Initial     Distance 1462 feet     Walk Time 6 minutes     # of Rest Breaks 0     MPH 2.8     METS 3.9     RPE 9     VO2 Peak 13.7     Symptoms No     Resting HR 78 bpm     Resting BP 144/90     Max Ex. HR 106 bpm     Max Ex. BP 150/90     2 Minute Post BP 132/90        Oxygen Initial Assessment:   Oxygen Re-Evaluation:   Oxygen Discharge (Final Oxygen Re-Evaluation):   Initial Exercise Prescription:     Initial Exercise Prescription - 01/29/17 1100      Date of Initial Exercise RX and Referring Provider   Date 01/29/17   Referring Provider Dr Darlina Guys     Recumbant Bike   Level 1.5   Minutes 10   METs 3     NuStep   Level 2   SPM 85   Minutes 10   METs 2.1     Track   Laps 10   Minutes 10   METs 2.74     Prescription Details   Frequency (times per week) 5   Duration Progress to 45 minutes of aerobic exercise without signs/symptoms of physical distress     Intensity   THRR 40-80% of Max Heartrate 63-126   Ratings of Perceived Exertion 11-15   Perceived Dyspnea 0-4     Progression   Progression Continue to progress workloads to maintain intensity without signs/symptoms of physical distress.     Resistance Training   Training Prescription Yes   Weight 2   Reps 10-15      Perform Capillary Blood Glucose checks as needed.  Exercise Prescription Changes:     Exercise Prescription Changes    Row Name 03/14/17 1200 04/05/17 1500 05/04/17 1300         Response to Exercise   Blood Pressure (Admit) 134/80 109/72 102/64     Blood Pressure (Exercise) 132/82 90/62 110/64     Blood Pressure (Exit) 108/72 122/84 102/60     Heart Rate (Admit) 78 bpm 76 bpm 70 bpm     Heart Rate (Exercise) 95 bpm 99 bpm 100 bpm      Heart Rate (Exit) 64 bpm  74 bpm 70 bpm     Rating of Perceived Exertion (Exercise) 9 11 12      Duration Progress to 45 minutes of aerobic exercise without signs/symptoms of physical distress Progress to 45 minutes of aerobic exercise without signs/symptoms of physical distress Progress to 45 minutes of aerobic exercise without signs/symptoms of physical distress     Intensity THRR unchanged THRR unchanged THRR unchanged       Progression   Progression Continue to progress workloads to maintain intensity without signs/symptoms of physical distress. Continue to progress workloads to maintain intensity without signs/symptoms of physical distress. Continue to progress workloads to maintain intensity without signs/symptoms of physical distress.     Average METs 2 2.2 2.5       Resistance Training   Training Prescription Yes Yes Yes     Weight 2 2 2      Reps 10-15 10-15 10-15       Recumbant Bike   Level 2 2 2      Minutes 10 10 10      METs 2.9 2 2.2       NuStep   Level 3 3 3      SPM 85 85 85     Minutes 10 10 10      METs 1.5 2 1.9       Track   Laps 12 7 13      Minutes 10 10 10      METs 3.09 2.4 3.26       Home Exercise Plan   Plans to continue exercise at Home (comment) Home (comment) Home (comment)     Frequency Add 3 additional days to program exercise sessions. Add 3 additional days to program exercise sessions. Add 3 additional days to program exercise sessions.     Initial Home Exercises Provided 02/28/17 02/28/17 02/28/17        Exercise Comments:     Exercise Comments    Row Name 02/14/17 1117 03/14/17 1119 04/05/17 1526 05/04/17 1346     Exercise Comments Pt is off to a good start with exercise! Reviewed METs and goals with pt.  reviewed goals with pt.   reviewed goals with pt.         Exercise Goals and Review:     Exercise Goals    Row Name 01/18/17 0843             Exercise Goals   Increase Physical Activity Yes       Intervention Provide advice,  education, support and counseling about physical activity/exercise needs.;Develop an individualized exercise prescription for aerobic and resistive training based on initial evaluation findings, risk stratification, comorbidities and participant's personal goals.       Expected Outcomes Achievement of increased cardiorespiratory fitness and enhanced flexibility, muscular endurance and strength shown through measurements of functional capacity and personal statement of participant.       Increase Strength and Stamina Yes  increase energy and activity levels; increase muscle tone and flatten belly region       Intervention Provide advice, education, support and counseling about physical activity/exercise needs.;Develop an individualized exercise prescription for aerobic and resistive training based on initial evaluation findings, risk stratification, comorbidities and participant's personal goals.       Expected Outcomes Achievement of increased cardiorespiratory fitness and enhanced flexibility, muscular endurance and strength shown through measurements of functional capacity and personal statement of participant.          Exercise Goals Re-Evaluation :     Exercise Goals Re-Evaluation  [place] Name 03/14/17 1117 04/05/17 1521 05/04/17 1345         Exercise Goal Re-Evaluation   Exercise Goals Review Increase Physical Activity;Increase Strenth and Stamina Increase Physical Activity;Increase Strenth and Stamina Increase Physical Activity;Increase Strenth and Stamina     Comments Pt is doing well with exercise and states that she is doing some walking at home on her off days from CR Pt continues to do well with exericse and states she feels stronger and overall she feels much better than she did prior to beginning an exercise program. Pt continues to do well with exercise and she is walking at home on her off days from CR     Expected Outcomes Continue with exercise Rx and increase workloads as  tolerated in order to increase exercise tolerance Continue with exercise Rx and increase workloads as tolerated in order to increase exercise tolerance Continue with exercise Rx and increase workloads as tolerated in order to increase exercise tolerance         Discharge Exercise Prescription (Final Exercise Prescription Changes):     Exercise Prescription Changes - 05/04/17 1300      Response to Exercise   Blood Pressure (Admit) 102/64   Blood Pressure (Exercise) 110/64   Blood Pressure (Exit) 102/60   Heart Rate (Admit) 70 bpm   Heart Rate (Exercise) 100 bpm   Heart Rate (Exit) 70 bpm   Rating of Perceived Exertion (Exercise) 12   Duration Progress to 45 minutes of aerobic exercise without signs/symptoms of physical distress   Intensity THRR unchanged     Progression   Progression Continue to progress workloads to maintain intensity without signs/symptoms of physical distress.   Average METs 2.5     Resistance Training   Training Prescription Yes   Weight 2   Reps 10-15     Recumbant Bike   Level 2   Minutes 10   METs 2.2     NuStep   Level 3   SPM 85   Minutes 10   METs 1.9     Track   Laps 13   Minutes 10   METs 3.26     Home Exercise Plan   Plans to continue exercise at Home (comment)   Frequency Add 3 additional days to program exercise sessions.   Initial Home Exercises Provided 02/28/17      Nutrition:  Target Goals: Understanding of nutrition guidelines, daily intake of sodium 1500mg , cholesterol 200mg , calories 30% from fat and 7% or less from saturated fats, daily to have 5 or more servings of fruits and vegetables.  Biometrics:     Pre Biometrics - 01/29/17 1205      Pre Biometrics   Height 5' 3.5" (1.613 m)       Nutrition Therapy Plan and Nutrition Goals:     Nutrition Therapy & Goals - 02/28/17 1112      Nutrition Therapy   Diet Therapeutic Lifestyle Changes     Personal Nutrition Goals   Nutrition Goal Pt to identify and  limit food intake of saturated fat, trans fat, and sodium     Intervention Plan   Intervention Prescribe, educate and counsel regarding individualized specific dietary modifications aiming towards targeted core components such as weight, hypertension, lipid management, diabetes, heart failure and other comorbidities.   Expected Outcomes Short Term Goal: Understand basic principles of dietary content, such as calories, fat, sodium, cholesterol and nutrients.;Long Term Goal: Adherence to prescribed nutrition plan.      Nutrition Discharge:  Nutrition Scores:     Nutrition Assessments - 02/28/17 1112      MEDFICTS Scores   Pre Score 60      Nutrition Goals Re-Evaluation:   Nutrition Goals Re-Evaluation:   Nutrition Goals Discharge (Final Nutrition Goals Re-Evaluation):   Psychosocial: Target Goals: Acknowledge presence or absence of significant depression and/or stress, maximize coping skills, provide positive support system. Participant is able to verbalize types and ability to use techniques and skills needed for reducing stress and depression.  Initial Review & Psychosocial Screening:     Initial Psych Review & Screening - 01/29/17 1749      Initial Review   Current issues with History of Depression;Current Depression     Family Dynamics   Good Support System? Yes   Concerns Inappropriate over/under dependence on family/friends   Comments Nerea has a an adult son and a friend she can rely on     Barriers   Psychosocial barriers to participate in program The patient should benefit from training in stress management and relaxation.     Screening Interventions   Interventions Yes;To provide support and resources with identified psychosocial needs;Other (comment)   Comments Appointment made for Ms Roa to follow up with her primary care physcian Dr Burnadette Pop tomoorw at 0800   Expected Outcomes Short Term goal: Utilizing psychosocial counselor, staff and physician to  assist with identification of specific Stressors or current issues interfering with healing process. Setting desired goal for each stressor or current issue identified.;Long Term Goal: Stressors or current issues are controlled or eliminated.;Short Term goal: Identification and review with participant of any Quality of Life or Depression concerns found by scoring the questionnaire.;Long Term goal: The participant improves quality of Life and PHQ9 Scores as seen by post scores and/or verbalization of changes      Quality of Life Scores:     Quality of Life - 01/18/17 1442      Quality of Life Scores   Health/Function Pre 22.27 %   Socioeconomic Pre 20.75 %   Psych/Spiritual Pre 14.57 %   Family Pre 10.6 %   GLOBAL Pre 18.59 %      PHQ-9: Recent Review Flowsheet Data    Depression screen [facility] 2/9 01/29/2017 01/29/2017   Decreased Interest - 0   Down, Depressed, Hopeless (No Data)  1   PHQ - 2 Score - 1     Interpretation of Total Score  Total Score Depression Severity:  1-4 = Minimal depression, 5-9 = Mild depression, 10-14 = Moderate depression, 15-19 = Moderately severe depression, 20-27 = Severe depression   Psychosocial Evaluation and Intervention:   Psychosocial Re-Evaluation:     Psychosocial Re-Evaluation    [place] Name 03/13/17 1224 04/09/17 1254 05/09/17 1818         Psychosocial Re-Evaluation   Current issues with None Identified None Identified None Identified     Interventions Encouraged to attend Cardiac Rehabilitation for the exercise Encouraged to attend Cardiac Rehabilitation for the exercise Encouraged to attend Cardiac Rehabilitation for the exercise     Continue Psychosocial Services  No Follow up required No Follow up required No Follow up required        Psychosocial Discharge (Final Psychosocial Re-Evaluation):     Psychosocial Re-Evaluation - 05/09/17 1818      Psychosocial Re-Evaluation   Current issues with None Identified   Interventions  Encouraged to attend Cardiac Rehabilitation for the exercise   Continue Psychosocial Services  No Follow up required  Vocational Rehabilitation: Provide vocational rehab assistance to qualifying candidates.   Vocational Rehab Evaluation & Intervention:     Vocational Rehab - 01/29/17 1746      Initial Vocational Rehab Evaluation & Intervention   Assessment shows need for Vocational Rehabilitation No  Ms Christoffel disabled and is not interested in vocational rehab at this time.      Education: Education Goals: Education classes will be provided on a weekly basis, covering required topics. Participant will state understanding/return demonstration of topics presented.  Learning Barriers/Preferences:     Learning Barriers/Preferences - 01/18/17 0842      Learning Barriers/Preferences   Learning Barriers Sight   Learning Preferences Skilled Demonstration      Education Topics: Count Your Pulse:  -Group instruction provided by verbal instruction, demonstration, patient participation and written materials to support subject.  Instructors address importance of being able to find your pulse and how to count your pulse when at home without a heart monitor.  Patients get hands on experience counting their pulse with staff help and individually.   Heart Attack, Angina, and Risk Factor Modification:  -Group instruction provided by verbal instruction, video, and written materials to support subject.  Instructors address signs and symptoms of angina and heart attacks.    Also discuss risk factors for heart disease and how to make changes to improve heart health risk factors.   Functional Fitness:  -Group instruction provided by verbal instruction, demonstration, patient participation, and written materials to support subject.  Instructors address safety measures for doing things around the house.  Discuss how to get up and down off the floor, how to pick things up properly, how to  safely get out of a chair without assistance, and balance training.   Meditation and Mindfulness:  -Group instruction provided by verbal instruction, patient participation, and written materials to support subject.  Instructor addresses importance of mindfulness and meditation practice to help reduce stress and improve awareness.  Instructor also leads participants through a meditation exercise.    Stretching for Flexibility and Mobility:  -Group instruction provided by verbal instruction, patient participation, and written materials to support subject.  Instructors lead participants through series of stretches that are designed to increase flexibility thus improving mobility.  These stretches are additional exercise for major muscle groups that are typically performed during regular warm up and cool down.   Hands Only CPR:  -Group verbal, video, and participation provides a basic overview of AHA guidelines for community CPR. Role-play of emergencies allow participants the opportunity to practice calling for help and chest compression technique with discussion of AED use.   Hypertension: -Group verbal and written instruction that provides a basic overview of hypertension including the most recent diagnostic guidelines, risk factor reduction with self-care instructions and medication management.    Nutrition I class: Heart Healthy Eating:  -Group instruction provided by PowerPoint slides, verbal discussion, and written materials to support subject matter. The instructor gives an explanation and review of the Therapeutic Lifestyle Changes diet recommendations, which includes a discussion on lipid goals, dietary fat, sodium, fiber, plant stanol/sterol esters, sugar, and the components of a well-balanced, healthy diet.   Nutrition II class: Lifestyle Skills:  -Group instruction provided by PowerPoint slides, verbal discussion, and written materials to support subject matter. The instructor gives  an explanation and review of label reading, grocery shopping for heart health, heart healthy recipe modifications, and ways to make healthier choices when eating out.   Diabetes Question & Answer:  -Group instruction provided  by PowerPoint slides, verbal discussion, and written materials to support subject matter. The instructor gives an explanation and review of diabetes co-morbidities, pre- and post-prandial blood glucose goals, pre-exercise blood glucose goals, signs, symptoms, and treatment of hypoglycemia and hyperglycemia, and foot care basics.   Diabetes Blitz:  -Group instruction provided by PowerPoint slides, verbal discussion, and written materials to support subject matter. The instructor gives an explanation and review of the physiology behind type 1 and type 2 diabetes, diabetes medications and rational behind using different medications, pre- and post-prandial blood glucose recommendations and Hemoglobin A1c goals, diabetes diet, and exercise including blood glucose guidelines for exercising safely.    Portion Distortion:  -Group instruction provided by PowerPoint slides, verbal discussion, written materials, and food models to support subject matter. The instructor gives an explanation of serving size versus portion size, changes in portions sizes over the last 20 years, and what consists of a serving from each food group.   Stress Management:  -Group instruction provided by verbal instruction, video, and written materials to support subject matter.  Instructors review role of stress in heart disease and how to cope with stress positively.     Exercising on Your Own:  -Group instruction provided by verbal instruction, power point, and written materials to support subject.  Instructors discuss benefits of exercise, components of exercise, frequency and intensity of exercise, and end points for exercise.  Also discuss use of nitroglycerin and activating EMS.  Review options of places  to exercise outside of rehab.  Review guidelines for sex with heart disease.   Cardiac Drugs I:  -Group instruction provided by verbal instruction and written materials to support subject.  Instructor reviews cardiac drug classes: antiplatelets, anticoagulants, beta blockers, and statins.  Instructor discusses reasons, side effects, and lifestyle considerations for each drug class.   Cardiac Drugs II:  -Group instruction provided by verbal instruction and written materials to support subject.  Instructor reviews cardiac drug classes: angiotensin converting enzyme inhibitors (ACE-I), angiotensin II receptor blockers (ARBs), nitrates, and calcium channel blockers.  Instructor discusses reasons, side effects, and lifestyle considerations for each drug class.   Anatomy and Physiology of the Circulatory System:  Group verbal and written instruction and models provide basic cardiac anatomy and physiology, with the coronary electrical and arterial systems. Review of: AMI, Angina, Valve disease, Heart Failure, Peripheral Artery Disease, Cardiac Arrhythmia, Pacemakers, and the ICD.   CARDIAC REHAB PHASE II EXERCISE from 04/25/2017 in [place]  Date  04/25/17  Educator  RN  Instruction Review Code  2- meets goals/outcomes      Other Education:  -Group or individual verbal, written, or video instructions that support the educational goals of the cardiac rehab program.   Knowledge Questionnaire Score:     Knowledge Questionnaire Score - 01/18/17 1437      Knowledge Questionnaire Score   Pre Score 25/28      Core Components/Risk Factors/Patient Goals at Admission:     Personal Goals and Risk Factors at Admission - 01/29/17 1819      Core Components/Risk Factors/Patient Goals on Admission   Tobacco Cessation Yes   Intervention Assist the participant in steps to quit. Provide individualized education and counseling about committing to Tobacco Cessation,  relapse prevention, and pharmacological support that can be provided by physician.;Advice worker, assist with locating and accessing local/national Quit Smoking programs, and support quit date choice.   Expected Outcomes Short Term: Will demonstrate readiness to quit, by selecting a quit  date.;Short Term: Will quit all tobacco product use, adhering to prevention of relapse plan.;Long Term: Complete abstinence from all tobacco products for at least 12 months from quit date.   Hypertension Yes   Intervention Provide education on lifestyle modifcations including regular physical activity/exercise, weight management, moderate sodium restriction and increased consumption of fresh fruit, vegetables, and low fat dairy, alcohol moderation, and smoking cessation.;Monitor prescription use compliance.   Expected Outcomes Short Term: Continued assessment and intervention until BP is < 140/21mm HG in hypertensive participants. < 130/48mm HG in hypertensive participants with diabetes, heart failure or chronic kidney disease.;Long Term: Maintenance of blood pressure at goal levels.   Stress Yes   Intervention Offer individual and/or small group education and counseling on adjustment to heart disease, stress management and health-related lifestyle change. Teach and support self-help strategies.;Refer participants experiencing significant psychosocial distress to appropriate mental health specialists for further evaluation and treatment. When possible, include family members and significant others in education/counseling sessions.   Expected Outcomes Short Term: Participant demonstrates changes in health-related behavior, relaxation and other stress management skills, ability to obtain effective social support, and compliance with psychotropic medications if prescribed.;Long Term: Emotional wellbeing is indicated by absence of clinically significant psychosocial distress or social isolation.   Personal Goal  Other Yes   Personal Goal Patient will increase energy and activity levels.      Core Components/Risk Factors/Patient Goals Review:    Core Components/Risk Factors/Patient Goals at Discharge (Final Review):    ITP Comments:     ITP Comments    Row Name 01/18/17 0840           ITP Comments Medical Director, Dr. Fransico Him          Comments: Savina is making expected progress toward personal goals after completing 27 sessions. Recommend continued exercise and life style modification education including  stress management and relaxation techniques to decrease cardiac risk profile. Adiya continues to do well with exercise. Will continue to encourage smoking cessation.Barnet Pall, RN,BSN 05/09/2017 6:21 PM

## 2017-05-11 ENCOUNTER — Encounter (HOSPITAL_COMMUNITY)
Admission: RE | Admit: 2017-05-11 | Discharge: 2017-05-11 | Disposition: A | Discharge status: Home / Self Care | Payer: Medicare HMO | Source: Ambulatory Visit | Attending: Internal Medicine | Admitting: Internal Medicine

## 2017-05-11 DIAGNOSIS — Z48812 Encounter for surgical aftercare following surgery on the circulatory system: Secondary | ICD-10-CM | POA: Diagnosis not present

## 2017-05-11 DIAGNOSIS — Z95810 Presence of automatic (implantable) cardiac defibrillator: Secondary | ICD-10-CM

## 2017-05-11 DIAGNOSIS — Z952 Presence of prosthetic heart valve: Secondary | ICD-10-CM | POA: Diagnosis not present

## 2017-05-14 ENCOUNTER — Encounter (HOSPITAL_COMMUNITY)
Admission: RE | Admit: 2017-05-14 | Discharge: 2017-05-14 | Disposition: A | Discharge status: Home / Self Care | Payer: Medicare HMO | Source: Ambulatory Visit | Attending: Internal Medicine | Admitting: Internal Medicine

## 2017-05-14 DIAGNOSIS — Z48812 Encounter for surgical aftercare following surgery on the circulatory system: Secondary | ICD-10-CM | POA: Diagnosis not present

## 2017-05-14 DIAGNOSIS — Z95810 Presence of automatic (implantable) cardiac defibrillator: Secondary | ICD-10-CM | POA: Diagnosis not present

## 2017-05-14 DIAGNOSIS — Z952 Presence of prosthetic heart valve: Secondary | ICD-10-CM

## 2017-05-15 DIAGNOSIS — I5022 Chronic systolic (congestive) heart failure: Secondary | ICD-10-CM | POA: Diagnosis not present

## 2017-05-15 DIAGNOSIS — D751 Secondary polycythemia: Secondary | ICD-10-CM | POA: Diagnosis not present

## 2017-05-15 DIAGNOSIS — F411 Generalized anxiety disorder: Secondary | ICD-10-CM | POA: Diagnosis not present

## 2017-05-15 DIAGNOSIS — M791 Myalgia: Secondary | ICD-10-CM | POA: Diagnosis not present

## 2017-05-16 ENCOUNTER — Telehealth: Payer: Self-pay | Admitting: Internal Medicine

## 2017-05-16 ENCOUNTER — Encounter (HOSPITAL_COMMUNITY)
Admission: RE | Admit: 2017-05-16 | Discharge: 2017-05-16 | Disposition: A | Discharge status: Home / Self Care | Payer: Medicare HMO | Source: Ambulatory Visit | Attending: Internal Medicine | Admitting: Internal Medicine

## 2017-05-16 DIAGNOSIS — Z95810 Presence of automatic (implantable) cardiac defibrillator: Secondary | ICD-10-CM

## 2017-05-16 DIAGNOSIS — Z952 Presence of prosthetic heart valve: Secondary | ICD-10-CM

## 2017-05-16 DIAGNOSIS — Z48812 Encounter for surgical aftercare following surgery on the circulatory system: Secondary | ICD-10-CM | POA: Diagnosis not present

## 2017-05-16 NOTE — Telephone Encounter (Signed)
Left VM for patient [employer] Disability paperwork ready for pick up.

## 2017-05-18 ENCOUNTER — Encounter (HOSPITAL_COMMUNITY)
Admission: RE | Admit: 2017-05-18 | Discharge: 2017-05-18 | Disposition: A | Discharge status: Home / Self Care | Payer: Medicare HMO | Source: Ambulatory Visit | Attending: Internal Medicine | Admitting: Internal Medicine

## 2017-05-18 DIAGNOSIS — Z95810 Presence of automatic (implantable) cardiac defibrillator: Secondary | ICD-10-CM

## 2017-05-18 DIAGNOSIS — Z952 Presence of prosthetic heart valve: Secondary | ICD-10-CM | POA: Diagnosis not present

## 2017-05-18 DIAGNOSIS — Z48812 Encounter for surgical aftercare following surgery on the circulatory system: Secondary | ICD-10-CM | POA: Diagnosis not present

## 2017-05-25 ENCOUNTER — Telehealth (HOSPITAL_COMMUNITY): Payer: Self-pay | Admitting: *Deleted

## 2017-05-30 ENCOUNTER — Encounter (HOSPITAL_COMMUNITY)
Admission: RE | Admit: 2017-05-30 | Discharge: 2017-05-30 | Disposition: A | Discharge status: Home / Self Care | Payer: Medicare HMO | Source: Ambulatory Visit | Attending: Internal Medicine | Admitting: Internal Medicine

## 2017-05-30 VITALS — BP 118/84 | HR 70 | Ht 63.5 in | Wt 139.8 lb

## 2017-05-30 DIAGNOSIS — Z952 Presence of prosthetic heart valve: Secondary | ICD-10-CM | POA: Diagnosis not present

## 2017-05-30 DIAGNOSIS — Z48812 Encounter for surgical aftercare following surgery on the circulatory system: Secondary | ICD-10-CM | POA: Insufficient documentation

## 2017-05-30 DIAGNOSIS — Z95810 Presence of automatic (implantable) cardiac defibrillator: Secondary | ICD-10-CM | POA: Insufficient documentation

## 2017-05-30 NOTE — Progress Notes (Signed)
Discharge Progress Report  Patient Details  Name: [name] MRN: [id number] Date of Birth: August 13, 1956 Referring Provider:     CARDIAC REHAB PHASE II EXERCISE from 01/29/2017 in [facility]  Referring Provider  Dr Darlina Guys       Number of Visits: 32  Reason for Discharge:  Patient independent in their exercise.  Smoking History:  History  Smoking Status  . Former Smoker  . Packs/day: 0.25  . Years: 40.00  . Types: Cigarettes  . Quit date: 09/25/2010  Smokeless Tobacco  . Never Used    Comment: patient says she smokes 4 cigarettes a day    Diagnosis:  11/16/16 S/P AVR (aortic valve replacement)  ICD (implantable cardioverter-defibrillator) in place  ADL [facility]:   Initial Exercise Prescription:     Initial Exercise Prescription - 01/29/17 1100      Date of Initial Exercise RX and Referring Provider   Date 01/29/17   Referring Provider Dr Darlina Guys     Recumbant Bike   Level 1.5   Minutes 10   METs 3     NuStep   Level 2   SPM 85   Minutes 10   METs 2.1     Track   Laps 10   Minutes 10   METs 2.74     Prescription Details   Frequency (times per week) 5   Duration Progress to 45 minutes of aerobic exercise without signs/symptoms of physical distress     Intensity   THRR 40-80% of Max Heartrate 63-126   Ratings of Perceived Exertion 11-15   Perceived Dyspnea 0-4     Progression   Progression Continue to progress workloads to maintain intensity without signs/symptoms of physical distress.     Resistance Training   Training Prescription Yes   Weight 2   Reps 10-15      Discharge Exercise Prescription (Final Exercise Prescription Changes):     Exercise Prescription Changes - 06/12/17 1400      Response to Exercise   Blood Pressure (Admit) 118/84   Blood Pressure (Exercise) 122/80   Blood Pressure (Exit) 112/63   Heart Rate (Admit) 70 bpm   Heart Rate (Exercise) 93 bpm   Heart Rate (Exit) 69 bpm    Rating of Perceived Exertion (Exercise) 11   Duration Progress to 45 minutes of aerobic exercise without signs/symptoms of physical distress   Intensity THRR unchanged     Progression   Progression Continue to progress workloads to maintain intensity without signs/symptoms of physical distress.   Average METs 2.7     Resistance Training   Training Prescription Yes   Weight 2   Reps 10-15     Recumbant Bike   Level 3   Minutes 10   METs 2.63     NuStep   Level 3   SPM 85   Minutes 10   METs 2.3     Track   Laps 13   Minutes 10   METs 3.26     Home Exercise Plan   Plans to continue exercise at Home (comment)   Frequency Add 3 additional days to program exercise sessions.   Initial Home Exercises Provided 02/28/17      Functional Capacity:     6 Minute Walk    Row Name 01/29/17 1155 05/07/17 1015       6 Minute Walk   Phase Initial Discharge    Distance 1462 feet 1225 feet    Distance %  Change  - -16.21 %    Walk Time 6 minutes 6 minutes    # of Rest Breaks 0 0    MPH 2.8 2.32    METS 3.9 3.02    RPE 9 11    VO2 Peak 13.7 10.57    Symptoms No No    Resting HR 78 bpm 76 bpm    Resting BP 144/90 118/64    Max Ex. HR 106 bpm 78 bpm    Max Ex. BP 150/90 114/68    2 Minute Post BP 132/90  -       Psychological, QOL, Others - Outcomes: PHQ 2/9: Depression screen [facility] 2/9 05/30/2017 01/29/2017 01/29/2017  Decreased Interest 0 - 0  Down, Depressed, Hopeless 1 (No Data) 1  PHQ - 2 Score 1 - 1    Quality of Life:     Quality of Life - 06/12/17 1435      Quality of Life Scores   Health/Function Pre 22.27 %   Health/Function Post 19.29 %   Health/Function % Change -13.38 %   Socioeconomic Pre 20.75 %   Socioeconomic Post 24 %   Socioeconomic % Change  15.66 %   Psych/Spiritual Pre 14.57 %   Psych/Spiritual Post 16.71 %   Psych/Spiritual % Change 14.69 %   Family Pre 10.6 %   Family Post 13.5 %   Family % Change 27.36 %   GLOBAL Pre 18.59 %    GLOBAL Post 19.03 %   GLOBAL % Change 2.37 %      Personal Goals: Goals established at orientation with interventions provided to work toward goal.     Personal Goals and Risk Factors at Admission - 01/29/17 1819      Core Components/Risk Factors/Patient Goals on Admission   Tobacco Cessation Yes   Intervention Assist the participant in steps to quit. Provide individualized education and counseling about committing to Tobacco Cessation, relapse prevention, and pharmacological support that can be provided by physician.;Advice worker, assist with locating and accessing local/national Quit Smoking programs, and support quit date choice.   Expected Outcomes Short Term: Will demonstrate readiness to quit, by selecting a quit date.;Short Term: Will quit all tobacco product use, adhering to prevention of relapse plan.;Long Term: Complete abstinence from all tobacco products for at least 12 months from quit date.   Hypertension Yes   Intervention Provide education on lifestyle modifcations including regular physical activity/exercise, weight management, moderate sodium restriction and increased consumption of fresh fruit, vegetables, and low fat dairy, alcohol moderation, and smoking cessation.;Monitor prescription use compliance.   Expected Outcomes Short Term: Continued assessment and intervention until BP is < 140/47m HG in hypertensive participants. < 130/872mHG in hypertensive participants with diabetes, heart failure or chronic kidney disease.;Long Term: Maintenance of blood pressure at goal levels.   Stress Yes   Intervention Offer individual and/or small group education and counseling on adjustment to heart disease, stress management and health-related lifestyle change. Teach and support self-help strategies.;Refer participants experiencing significant psychosocial distress to appropriate mental health specialists for further evaluation and treatment. When possible, include  family members and significant others in education/counseling sessions.   Expected Outcomes Short Term: Participant demonstrates changes in health-related behavior, relaxation and other stress management skills, ability to obtain effective social support, and compliance with psychotropic medications if prescribed.;Long Term: Emotional wellbeing is indicated by absence of clinically significant psychosocial distress or social isolation.   Personal Goal Other Yes   Personal Goal Patient will increase energy  and activity levels.       Personal Goals Discharge:     Goals and Risk Factor Review    Row Name 05/30/17 1042 05/30/17 1044 06/26/17 1701         Core Components/Risk Factors/Patient Goals Review   Personal Goals Review Tobacco Cessation Hypertension Hypertension;Lipids;Tobacco Cessation     Review Morena reportsn she has not had a cigarette in two days and continues to work on smoking cessation Fabiha's blood pressures have been much imporved Elon's blood pressures have been much imporved     Expected Outcomes still smokes but has cut back alot goal met. Anayia will continue to monitor her blood pressures at home Demiana will continue to work on quitting smoking, taking her medications as presribed to control her blood pressure and lipid levels        Exercise Goals and Review:     Exercise Goals    Row Name 01/18/17 0843             Exercise Goals   Increase Physical Activity Yes       Intervention Provide advice, education, support and counseling about physical activity/exercise needs.;Develop an individualized exercise prescription for aerobic and resistive training based on initial evaluation findings, risk stratification, comorbidities and participant's personal goals.       Expected Outcomes Achievement of increased cardiorespiratory fitness and enhanced flexibility, muscular endurance and strength shown through measurements of functional capacity and personal statement  of participant.       Increase Strength and Stamina Yes  increase energy and activity levels; increase muscle tone and flatten belly region       Intervention Provide advice, education, support and counseling about physical activity/exercise needs.;Develop an individualized exercise prescription for aerobic and resistive training based on initial evaluation findings, risk stratification, comorbidities and participant's personal goals.       Expected Outcomes Achievement of increased cardiorespiratory fitness and enhanced flexibility, muscular endurance and strength shown through measurements of functional capacity and personal statement of participant.          Nutrition & Weight - Outcomes:     Pre Biometrics - 01/29/17 1205      Pre Biometrics   Height 5' 3.5" (1.613 m)         Post Biometrics - 05/30/17 1100       Post  Biometrics   Height 5' 3.5" (1.613 m)   Weight 139 lb 12.4 oz (63.4 kg)   Waist Circumference 32 inches   Hip Circumference 38.5 inches   Waist to Hip Ratio 0.83 %   BMI (Calculated) 24.37   Triceps Skinfold 21 mm   % Body Fat 34.3 %   Grip Strength 26 kg   Flexibility 10.5 in   Single Leg Stand 8.68 seconds      Nutrition:     Nutrition Therapy & Goals - 02/28/17 1112      Nutrition Therapy   Diet Therapeutic Lifestyle Changes     Personal Nutrition Goals   Nutrition Goal Pt to identify and limit food intake of saturated fat, trans fat, and sodium     Intervention Plan   Intervention Prescribe, educate and counsel regarding individualized specific dietary modifications aiming towards targeted core components such as weight, hypertension, lipid management, diabetes, heart failure and other comorbidities.   Expected Outcomes Short Term Goal: Understand basic principles of dietary content, such as calories, fat, sodium, cholesterol and nutrients.;Long Term Goal: Adherence to prescribed nutrition plan.      Nutrition  Discharge:     Nutrition  Assessments - 06/26/17 1339      MEDFICTS Scores   Pre Score 60   Post Score 53   Score Difference -7      Education Questionnaire Score:     Knowledge Questionnaire Score - 06/12/17 1434      Knowledge Questionnaire Score   Pre Score 25/28   Post Score 20/28      Goals reviewed with patient; copy given to patient. Anaria graduated from cardiac rehab program today with completion of 32 exercise sessions in Phase II. Pt maintained good attendance and progressed nicely during his participation in rehab as evidenced by increased MET level.   Medication list reconciled. Repeat  PHQ score- 1 .  Pt has made significant lifestyle changes and should be commended for his success. Pt feels  she has achieved his goals during cardiac rehab.   Pt plans to continue exercise by walking and is going a senior support group that exercises together.Camaya maintained her weight and had good control of her blood pressures. Barnet Pall, RN,BSN 06/26/2017 5:04 PM

## 2017-05-31 DIAGNOSIS — D696 Thrombocytopenia, unspecified: Secondary | ICD-10-CM | POA: Diagnosis not present

## 2017-06-12 NOTE — Addendum Note (Signed)
Encounter addended by: Sol Passer on: 06/12/2017  2:40 PM<BR>    Actions taken: Flowsheet data copied forward, Visit Navigator Flowsheet section accepted

## 2017-06-26 NOTE — Addendum Note (Signed)
Encounter addended by: Magda Kiel, RN on: 06/26/2017  5:08 PM<BR>    Actions taken: Flowsheet data copied forward, Visit Navigator Flowsheet section accepted, Sign clinical note, Episode resolved

## 2017-06-26 NOTE — Addendum Note (Signed)
Encounter addended by: Jewel Baize, RD on: 06/26/2017  1:44 PM<BR>    Actions taken: Flowsheet data copied forward, Visit Navigator Flowsheet section accepted

## 2017-07-02 ENCOUNTER — Telehealth: Payer: Self-pay | Admitting: Cardiovascular Disease

## 2017-07-02 NOTE — Telephone Encounter (Signed)
Per pt does not feel well notes weight gain of 3 lbs in week and swelling to feet . Currently taking Furosemide 40 mg every other day the patient requesting appt .Appt made for 07/05/17 at 8:30 am with Cecilie Kicks Np Also encouraged pt to continue to watch salt intake the patient agrees ./cy

## 2017-07-02 NOTE — Telephone Encounter (Signed)
Follow up pt gave wrong cell number please call both house and cell on file

## 2017-07-02 NOTE — Telephone Encounter (Signed)
Pt c/o swelling: STAT is pt has developed SOB within 24 hours  How much weight have you gained and in what time span? 3pounds week  1) If swelling, where is the swelling located? feet  2) Are you currently taking a fluid pill? Yes   3) Are you currently SOB? no   4) Do you have a log of your daily weights (if so, list)? Yes   9-23  135   9-26  138   9-27  138   9-30-140  10-8   142  Have you gained 3 pounds in a day or 5 pounds in a week? week   5) Have you traveled recently?  no

## 2017-07-03 NOTE — Telephone Encounter (Signed)
Take lasix daily for 5 days then back to every other  Day.

## 2017-07-04 ENCOUNTER — Ambulatory Visit (INDEPENDENT_AMBULATORY_CARE_PROVIDER_SITE_OTHER): Payer: Medicare HMO | Admitting: *Deleted

## 2017-07-04 DIAGNOSIS — I472 Ventricular tachycardia, unspecified: Secondary | ICD-10-CM

## 2017-07-04 NOTE — Progress Notes (Signed)
Cardiology Office Note   Date:  07/05/2017   ID:  LILYBELLE MAYEDA, DOB November 16, 1955, MRN [id number]  PCP:  Lujean Amel, MD  Cardiologist:  Dr. Angelena Form EP:  Dr. Lovena Le    Chief Complaint  Patient presents with  . Aortic Stenosis    s/p AVR       History of Present Illness: [name] is a 61 y.o. female who presents for lower ext edema today with a hx of AS s/p AVR with 19 mm Edwards Magna-Ease pericardial valve in Feb 5974,  systolic HF.    She has a history of chronic systolic heart failure and left bundle branch block, with normalization of her LV function after BiV Pacing as well as dyslipidemia. She has had issues controlling HTN.  She is status post biventricular ICD implantation. In the interim, she has done well but had developed aortic stenosis and undgone valve replacement.  Today with lower ext edema.  No SOB, no chest pain.  We reviewed her diet and she has had increase of salt.  She has been eating out more freq. And had Ragu last night with 3 cheeses.   She continues to smoke, half a pk per day.  She cannot afford the nicoderm.   We discussed cutting back to 7 best cigarettes per day, then in a month to 5 with continued cutting back until stopping.  I asked her to place cigarettes up somewhere so she must go get them to help break the habit.   She is asking about diabetes.  She has been told she is borderline.  We will check labs.      Past Medical History:  Diagnosis Date  . Abnormal liver function tests   . AICD (automatic cardioverter/defibrillator) present    [employer]  . Anxiety   . Aortic stenosis   . Benign neoplasm of colon 04/19/2001   Hyperplastic  . Bicuspid aortic valve   . Cardiomyopathy secondary   . CHF (congestive heart failure) ([place])   . Coronary artery disease   . Depression   . Headache   . Heart murmur   . HTN (hypertension)   . Hypercalcemia   . Hyperlipidemia   . S/P ICD (internal cardiac defibrillator) procedure      Past Surgical History:  Procedure Laterality Date  . AORTIC VALVE REPLACEMENT N/A 11/16/2016   Procedure: AORTIC VALVE REPLACEMENT (AVR);  Surgeon: Gaye Pollack, MD;  Location: [facility];  Service: Open Heart Surgery;  Laterality: N/A;  70mm Perimount Magna Ease Pericardial Bioprosthesis  . BREAST EXCISIONAL BIOPSY Left 1984  . CARDIAC CATHETERIZATION N/A 07/05/2016   Procedure: Right/Left Heart Cath and Coronary Angiography;  Surgeon: Burnell Blanks, MD;  Location: [facility];  Service: Cardiovascular;  Laterality: N/A;  . CHOLECYSTECTOMY    . implantation of a Guidant biventricular ICD    . laparoscopic cholecystectomy with intraoperative cholangiogram    . TEE WITHOUT CARDIOVERSION N/A 11/16/2016   Procedure: TRANSESOPHAGEAL ECHOCARDIOGRAM (TEE);  Surgeon: Gaye Pollack, MD;  Location: [facility];  Service: Open Heart Surgery;  Laterality: N/A;     Current Outpatient Prescriptions  Medication Sig Dispense Refill  . acetaminophen (TYLENOL) 325 MG tablet Take 325 mg by mouth every 6 (six) hours as needed for mild pain.    Marland Kitchen aspirin 325 MG EC tablet Take 1 tablet (325 mg total) by mouth daily. 30 tablet 0  . carvedilol (COREG) 25 MG tablet Take 25 mg by mouth 2 (  two) times daily with a meal.     . Multiple Vitamin (MULTIVITAMIN WITH MINERALS) TABS tablet Take 1 tablet by mouth at bedtime.    Marland Kitchen olmesartan (BENICAR) 40 MG tablet Take 0.5 tablets (20 mg total) by mouth daily.    Vladimir Faster Glycol-Propyl Glycol (SYSTANE OP) Place 1-2 drops into both eyes 3 (three) times daily as needed (for dry eyes.).    Marland Kitchen rosuvastatin (CRESTOR) 10 MG tablet Take 1 tablet (10 mg total) by mouth daily. 90 tablet 3  . sertraline (ZOLOFT) 50 MG tablet Take 50 mg by mouth daily.    . furosemide (LASIX) 40 MG tablet Take 1 tablet by mouth daily for 5 days then go down to 1 tablet by mouth every other day 90 tablet 3  . nicotine (NICODERM CQ - DOSED IN MG/24 HOURS) 14 mg/24hr patch Place 1 patch (14  mg total) onto the skin daily. (Patient not taking: Reported on 07/05/2017) 28 patch 0   No current facility-administered medications for this visit.     Allergies:   No known allergies    Social History:  The patient  reports that she quit smoking about 6 years ago. Her smoking use included Cigarettes. She has a 10.00 pack-year smoking history. She has never used smokeless tobacco. She reports that she drinks alcohol. She reports that she does not use drugs.   Family History:  The patient's family history includes Bone cancer in her brother; Colon cancer in her maternal aunt; Heart attack in her brother; Pancreatic cancer in her mother.    ROS:  General:no colds or fevers, no weight changes Skin:no rashes or ulcers HEENT:no blurred vision, no congestion CV:see HPI PUL:see HPI GI:no diarrhea constipation or melena, no indigestion GU:no hematuria, no dysuria MS:no joint pain, no claudication Neuro:no syncope, no lightheadedness Endo:borderline diabetes, no thyroid disease  Wt Readings from Last 3 Encounters:  07/05/17 144 lb (65.3 kg)  05/30/17 139 lb 12.4 oz (63.4 kg)  03/05/17 136 lb 6.4 oz (61.9 kg)     PHYSICAL EXAM: VS:  BP (!) 136/100   Pulse 76   Ht 5' 3.5" (1.613 m)   Wt 144 lb (65.3 kg)   SpO2 99%   BMI 25.11 kg/m  , BMI Body mass index is 25.11 kg/m. General:Pleasant affect, NAD Skin:Warm and dry, brisk capillary refill HEENT:normocephalic, sclera clear, mucus membranes moist Neck:supple, no JVD, no bruits  Heart:S1S2 RRR without murmur, occ premature beat, no gallup, rub or click Lungs:clear without rales, rhonchi, or wheezes FAO:ZHYQ, non tender, + BS, do not palpate liver spleen or masses Ext:no lower ext edema to trace, 2+ pedal pulses, 2+ radial pulses Neuro:alert and oriented X 3, MAE, follows commands, + facial symmetry    EKG:  EKG is NOT ordered today.    Recent Labs: 11/14/2016: ALT 35 11/17/2016: Magnesium 1.9 11/20/2016: Hemoglobin 12.1;  Platelets 157 02/08/2017: BUN 28; Creatinine, Ser 0.94; Potassium 4.1; Sodium 140    Lipid Panel No results found for: CHOL, TRIG, HDL, CHOLHDL, VLDL, LDLCALC, LDLDIRECT     Other studies Reviewed: Additional studies/ records that were reviewed today include: . TEE in OR Result status: Final result   Left ventricle: Cavity is small. Concentric hypertrophy of moderate severity. LV systolic function is normal with an EF of 65-70%. Grade II (moderate, pseudorelaxation) left ventricular diastolic dysfunction. There are no obvious wall motion abnormalities. No thrombus present. No mass present.  Left atrium: Small spherical echogenic mass present. The mass is attached to the mitral  valve.  Aortic valve: The valve is trileaflet. Moderate valve thickening present. Severe valve calcification present. Severely decreased leaflet separation. There is a bioprosthetic valve present. The prosthetic valve is normal. Severe stenosis. Moderate regurgitation. No AV vegetation.  Mitral valve: Mild regurgitation.  Right ventricle: Normal cavity size, wall thickness and ejection fraction.     RT and LT heart cath 07/05/16 Conclusion     Prox RCA lesion, 20 %stenosed.  Mid RCA lesion, 50 %stenosed.  Dist RCA lesion, 90 %stenosed.  Ost 2nd Mrg to 2nd Mrg lesion, 20 %stenosed.  Ost LAD to Mid LAD lesion, 20 %stenosed.  Ost 2nd Diag lesion, 20 %stenosed.  There is severe aortic valve stenosis.   1. No obstructive disease noted in the major epicardial vessels.  2. Severe aortic valve stenosis (mean gradient 51.4 mmHg, peak gradient 59 mm Hg, AVA 0.5cm2)  Recommendations: Medical management of CAD. Surgical referral for evaluation for surgical aortic valve replacement.       ASSESSMENT AND PLAN:  1.  AS s/p AVR with pericardial tissue valve.  On ASA 325 mg daily, she will see Dr. Angelena Form in Nov. And he will decrease to 81 mg if he agrees.    2.  Lower ext edema with HTN and salt  overload.  Will change lasix to 40 mg daily for 5 days then back to every other day.  Will check BMP today. Will check Echo as well.   3.  CAD non obstructive on cath 06/2016 no chest pain.  4.  HTN elevated today and may be due to volume overload.  With increase of lasix it may return to normal.  Has been difficult to control.  She will follow up with Dr. Angelena Form  5.  HLD, continue statin   - will need lipids checked soon  6. Tobacco use we discussed ways to stop, she may need Chantix.   7.. Anemia mild check CBC  8. Metabolic syndrome with hyperglycemia   Current medicines are reviewed with the patient today.  The patient Has no concerns regarding medicines.  The following changes have been made:  See above Labs/ tests ordered today include:see above  Disposition:   FU:  see above  Signed, Cecilie Kicks, NP  07/05/2017 9:23 AM    [facility] [address] [place], [place], [place] Phone: [phone]; Fax: [phone]

## 2017-07-04 NOTE — Telephone Encounter (Signed)
Left message to call back  

## 2017-07-04 NOTE — Progress Notes (Signed)
Remote ICD transmission.   

## 2017-07-05 ENCOUNTER — Encounter: Payer: Self-pay | Admitting: Cardiology

## 2017-07-05 ENCOUNTER — Encounter (INDEPENDENT_AMBULATORY_CARE_PROVIDER_SITE_OTHER): Payer: Self-pay

## 2017-07-05 ENCOUNTER — Ambulatory Visit (INDEPENDENT_AMBULATORY_CARE_PROVIDER_SITE_OTHER): Payer: Medicare HMO | Admitting: Cardiology

## 2017-07-05 VITALS — BP 136/100 | HR 76 | Ht 63.5 in | Wt 144.0 lb

## 2017-07-05 DIAGNOSIS — R609 Edema, unspecified: Secondary | ICD-10-CM

## 2017-07-05 DIAGNOSIS — R739 Hyperglycemia, unspecified: Secondary | ICD-10-CM | POA: Diagnosis not present

## 2017-07-05 DIAGNOSIS — D649 Anemia, unspecified: Secondary | ICD-10-CM | POA: Diagnosis not present

## 2017-07-05 DIAGNOSIS — I10 Essential (primary) hypertension: Secondary | ICD-10-CM

## 2017-07-05 DIAGNOSIS — Z952 Presence of prosthetic heart valve: Secondary | ICD-10-CM | POA: Diagnosis not present

## 2017-07-05 MED ORDER — FUROSEMIDE 40 MG PO TABS: ORAL_TABLET | ORAL | 3 refills | Status: DC

## 2017-07-05 NOTE — Patient Instructions (Addendum)
Medication Instructions:  Your physician has recommended you make the following change in your medication:  1.  TAKE the Lasix 40 mg daily FOR 5 DAYS then go back to taking it EVERY OTHER DAY   Labwork: TODAY:  BMET, CBC, & HGBA1C  Testing/Procedures: Your physician has requested that you have an echocardiogram. Echocardiography is a painless test that uses sound waves to create images of your heart. It provides your doctor with information about the size and shape of your heart and how well your heart's chambers and valves are working. This procedure takes approximately one hour. There are no restrictions for this procedure.   Follow-Up: Your physician recommends that you schedule a follow-up appointment in: [place] AS PLANNED   Any Other Special Instructions Will Be Listed Below (If Applicable). 1.  REDUCE YOUR TOBACCO USE BY DECREASING TO 7 CIGARETTES FOR 1 MONTH, THEN 5 IN ONE MONTH, THEN CONTINUE TO DECREASE UNTIL YOU COMPLETELY STOP    Echocardiogram An echocardiogram, or echocardiography, uses sound waves (ultrasound) to produce an image of your heart. The echocardiogram is simple, painless, obtained within a short period of time, and offers valuable information to your health care provider. The images from an echocardiogram can provide information such as:  Evidence of coronary artery disease (CAD).  Heart size.  Heart muscle function.  Heart valve function.  Aneurysm detection.  Evidence of a past heart attack.  Fluid buildup around the heart.  Heart muscle thickening.  Assess heart valve function.  Tell a health care provider about:  Any allergies you have.  All medicines you are taking, including vitamins, herbs, eye drops, creams, and over-the-counter medicines.  Any problems you or family members have had with anesthetic medicines.  Any blood disorders you have.  Any surgeries you have had.  Any medical conditions you  have.  Whether you are pregnant or may be pregnant. What happens before the procedure? No special preparation is needed. Eat and drink normally. What happens during the procedure?  In order to produce an image of your heart, gel will be applied to your chest and a wand-like tool (transducer) will be moved over your chest. The gel will help transmit the sound waves from the transducer. The sound waves will harmlessly bounce off your heart to allow the heart images to be captured in real-time motion. These images will then be recorded.  You may need an IV to receive a medicine that improves the quality of the pictures. What happens after the procedure? You may return to your normal schedule including diet, activities, and medicines, unless your health care provider tells you otherwise. This information is not intended to replace advice given to you by your health care provider. Make sure you discuss any questions you have with your health care provider. Document Released: 09/08/2000 Document Revised: 04/29/2016 Document Reviewed: 05/19/2013 Elsevier Interactive Patient Education  2017 [employer].  If you need a refill on your cardiac medications before your next appointment, please call your pharmacy.

## 2017-07-05 NOTE — Telephone Encounter (Signed)
Pt was seen in the office today and was given these instructions.

## 2017-07-06 ENCOUNTER — Other Ambulatory Visit: Payer: Medicare HMO | Admitting: *Deleted

## 2017-07-06 ENCOUNTER — Encounter: Payer: Self-pay | Admitting: Cardiology

## 2017-07-06 DIAGNOSIS — R739 Hyperglycemia, unspecified: Secondary | ICD-10-CM | POA: Diagnosis not present

## 2017-07-06 DIAGNOSIS — D649 Anemia, unspecified: Secondary | ICD-10-CM | POA: Diagnosis not present

## 2017-07-06 DIAGNOSIS — R609 Edema, unspecified: Secondary | ICD-10-CM | POA: Diagnosis not present

## 2017-07-10 LAB — BASIC METABOLIC PANEL
BUN/Creatinine Ratio: 13 (ref 12–28)
BUN: 12 mg/dL (ref 8–27)
CO2: 20 mmol/L (ref 20–29)
Calcium: 11 mg/dL — ABNORMAL HIGH (ref 8.7–10.3)
Chloride: 100 mmol/L (ref 96–106)
Creatinine, Ser: 0.96 mg/dL (ref 0.57–1.00)
GFR calc Af Amer: 73 mL/min/{1.73_m2} (ref >59)
GFR calc non Af Amer: 63 mL/min/{1.73_m2} (ref >59)
Glucose: 98 mg/dL (ref 65–99)
Potassium: 4.2 mmol/L (ref 3.5–5.2)
Sodium: 139 mmol/L (ref 134–144)

## 2017-07-10 LAB — HEMOGLOBIN A1C
Est. average glucose Bld gHb Est-mCnc: 128 mg/dL
Hgb A1c MFr Bld: 6.1 % — ABNORMAL HIGH (ref 4.8–5.6)

## 2017-07-10 LAB — CBC

## 2017-07-11 ENCOUNTER — Other Ambulatory Visit: Payer: Self-pay

## 2017-07-11 ENCOUNTER — Telehealth: Payer: Self-pay

## 2017-07-11 ENCOUNTER — Ambulatory Visit (HOSPITAL_COMMUNITY): Payer: Medicare HMO | Attending: Cardiovascular Disease

## 2017-07-11 DIAGNOSIS — D649 Anemia, unspecified: Secondary | ICD-10-CM

## 2017-07-11 DIAGNOSIS — I361 Nonrheumatic tricuspid (valve) insufficiency: Secondary | ICD-10-CM | POA: Diagnosis not present

## 2017-07-11 DIAGNOSIS — Z952 Presence of prosthetic heart valve: Secondary | ICD-10-CM | POA: Diagnosis not present

## 2017-07-11 DIAGNOSIS — I359 Nonrheumatic aortic valve disorder, unspecified: Secondary | ICD-10-CM

## 2017-07-11 DIAGNOSIS — I10 Essential (primary) hypertension: Secondary | ICD-10-CM

## 2017-07-11 NOTE — Telephone Encounter (Signed)
Per [facility], called patient to reschedule CBC as the specimen was drawn during the power outage and the specimen was bad. She will need to come in for re-draw.  Left message to call back.

## 2017-07-12 ENCOUNTER — Telehealth: Payer: Self-pay

## 2017-07-12 NOTE — Telephone Encounter (Signed)
lmtcb for echo results.

## 2017-07-12 NOTE — Telephone Encounter (Signed)
Left message for patient to call back  

## 2017-07-16 LAB — CUP PACEART REMOTE DEVICE CHECK
Battery Remaining Longevity: 54 mo
Battery Remaining Percentage: 80 %
Brady Statistic RA Percent Paced: 0 %
Brady Statistic RV Percent Paced: 100 %
Date Time Interrogation Session: 20181010041100
HighPow Impedance: 56 Ohm
Implantable Lead Implant Date: 20060630
Implantable Lead Implant Date: 20060630
Implantable Lead Implant Date: 20060630
Implantable Lead Location: 753858
Implantable Lead Location: 753859
Implantable Lead Location: 753860
Implantable Lead Model: 157
Implantable Lead Model: 4086
Implantable Lead Model: 4543
Implantable Lead Serial Number: 119717
Implantable Lead Serial Number: 131677
Implantable Lead Serial Number: 223436
Implantable Pulse Generator Implant Date: 20120131
Lead Channel Impedance Value: 1283 Ohm
Lead Channel Impedance Value: 445 Ohm
Lead Channel Impedance Value: 526 Ohm
Lead Channel Pacing Threshold Amplitude: 1 V
Lead Channel Pacing Threshold Amplitude: 1.2 V
Lead Channel Pacing Threshold Amplitude: 1.2 V
Lead Channel Pacing Threshold Pulse Width: 0.4 ms
Lead Channel Pacing Threshold Pulse Width: 0.4 ms
Lead Channel Pacing Threshold Pulse Width: 0.4 ms
Lead Channel Setting Pacing Amplitude: 2 V
Lead Channel Setting Pacing Amplitude: 2.2 V
Lead Channel Setting Pacing Amplitude: 2.4 V
Lead Channel Setting Pacing Pulse Width: 0.4 ms
Lead Channel Setting Pacing Pulse Width: 0.4 ms
Lead Channel Setting Sensing Sensitivity: 0.5 mV
Lead Channel Setting Sensing Sensitivity: 1 mV
Pulse Gen Serial Number: 498164

## 2017-07-16 NOTE — Telephone Encounter (Signed)
Called patient. Patient aware of lab results. Patient verbalized understanding and will come in tomorrow for a repeat CBC.

## 2017-07-17 ENCOUNTER — Other Ambulatory Visit (INDEPENDENT_AMBULATORY_CARE_PROVIDER_SITE_OTHER): Payer: Medicare HMO

## 2017-07-17 ENCOUNTER — Telehealth: Payer: Self-pay | Admitting: Cardiology

## 2017-07-17 DIAGNOSIS — D649 Anemia, unspecified: Secondary | ICD-10-CM | POA: Diagnosis not present

## 2017-07-17 DIAGNOSIS — Z23 Encounter for immunization: Secondary | ICD-10-CM | POA: Diagnosis not present

## 2017-07-17 DIAGNOSIS — I359 Nonrheumatic aortic valve disorder, unspecified: Secondary | ICD-10-CM

## 2017-07-17 DIAGNOSIS — I10 Essential (primary) hypertension: Secondary | ICD-10-CM | POA: Diagnosis not present

## 2017-07-17 LAB — CBC
Hematocrit: 45.7 % (ref 34.0–46.6)
Hemoglobin: 16.1 g/dL — ABNORMAL HIGH (ref 11.1–15.9)
MCH: 29.9 pg (ref 26.6–33.0)
MCHC: 35.2 g/dL (ref 31.5–35.7)
MCV: 85 fL (ref 79–97)
Platelets: 138 10*3/uL — ABNORMAL LOW (ref 150–379)
RBC: 5.38 x10E6/uL — ABNORMAL HIGH (ref 3.77–5.28)
RDW: 15.4 % (ref 12.3–15.4)
WBC: 4.1 10*3/uL (ref 3.4–10.8)

## 2017-07-17 NOTE — Telephone Encounter (Signed)
Informed the pt that she can have her flu shot administered today, after having her lab done.  Spoke with our lab dept and informed them to call triage once pts labs are drawn, so we can come and administer the pts flu shot.  Advised the pt to have her lab done first, then have the tech call our triage room to have her flu shot administered.  Pt verbalized understanding and agrees with this plan.

## 2017-07-17 NOTE — Telephone Encounter (Signed)
New Message:    Pt says she is coming for lab work,wants to know if she can have a flu shot when she gets her lab?

## 2017-07-18 ENCOUNTER — Telehealth: Payer: Self-pay | Admitting: Cardiovascular Disease

## 2017-07-18 DIAGNOSIS — I359 Nonrheumatic aortic valve disorder, unspecified: Secondary | ICD-10-CM

## 2017-07-18 DIAGNOSIS — I350 Nonrheumatic aortic (valve) stenosis: Secondary | ICD-10-CM

## 2017-07-18 NOTE — Telephone Encounter (Signed)
Notes recorded by Isaiah Serge, NP on 07/17/2017 at 5:39 PM EDT hgb is normal. Recheck in 4 months due to lower platelet count -to see if it continues to decrease but for not not that abnormal. Just something to monitor  I spoke with pt and reviewed lab work and recommendations with her. I scheduled her for a CBC on 11/13/17

## 2017-07-18 NOTE — Telephone Encounter (Signed)
Pt returned call for LAB results.

## 2017-08-09 NOTE — Progress Notes (Signed)
 Chief Complaint  Patient presents with  . Coronary Artery Disease   History of Present Illness: 61 yo female with history of non-ischemic cardiomyopathy s/p ICD with resolution of LV function, chronic systolic CHF, severe aortic stenosis s/p AVR, HLD, HTN and ongoing tobacco abuse who is here today for follow up of her aortic stenosis. I met her in March 2017. She was referred to the valve clinic by Dr. Taylor for evaluation of aortic stenosis. She is followed by Dr. Taylor for her non-ischemic cardiomyopathy and ICD. Echo 11/02/15 with normal LV systolic function, severe aortic valve stenosis with mean gradient of 51 mm Hg. She was asymptomatic at that time. Repeat echo September 2017 with normal LV function, severe AS with mean gradient 47 mmHg, peak gradient 86 mmHg, AVA 0.79cm2, mild AI, mild MR. When I saw her in October 2017, she reported dizziness and I felt this was due to progression of her valve disease. Cardiac cath October 2017 with mild disease in the major epicardial vessels. There was severe stenosis in the very distal posterolateral branch of the RCA, not felt to be amenable to PCI. Severe aortic stenosis was confirmed by hemodynamics. She underwent aortic valve replacement with placement of a 19 mm Edwards Magna-Ease pericardial valve on 11/16/16. Echo October 2018 with normal LV function and normally functioning AVR with expected increased gradients across valve.   She is here today for follow up. The patient denies any chest pain, dyspnea, palpitations, lower extremity edema, orthopnea, PND, dizziness, near syncope or syncope. She is feeling well.    Primary Care Physician: Koirala, Dibas, MD   Past Medical History:  Diagnosis Date  . Abnormal liver function tests   . AICD (automatic cardioverter/defibrillator) present    defib [employer]  . Anxiety   . Aortic stenosis   . Benign neoplasm of colon 04/19/2001   Hyperplastic  . Bicuspid aortic valve   . Cardiomyopathy  secondary   . CHF (congestive heart failure) (HCC)   . Coronary artery disease   . Depression   . Headache   . Heart murmur   . HTN (hypertension)   . Hypercalcemia   . Hyperlipidemia   . S/P ICD (internal cardiac defibrillator) procedure     Past Surgical History:  Procedure Laterality Date  . AORTIC VALVE REPLACEMENT (AVR) N/A 11/16/2016   Performed by Bartle, Bryan K, MD at [facility] OR  . BREAST EXCISIONAL BIOPSY Left 1984  . CHOLECYSTECTOMY    . implantation of a Guidant biventricular ICD    . laparoscopic cholecystectomy with intraoperative cholangiogram    . Right/Left Heart Cath and Coronary Angiography N/A 07/05/2016   Performed by McAlhany, Christopher D, MD at [facility] INVASIVE [facility]  . TRANSESOPHAGEAL ECHOCARDIOGRAM (TEE) N/A 11/16/2016   Performed by Bartle, Bryan K, MD at [facility] OR    Current Outpatient Medications  Medication Sig Dispense Refill  . acetaminophen (TYLENOL) 325 MG tablet Take 325 mg by mouth every 6 (six) hours as needed for mild pain.    . carvedilol (COREG) 25 MG tablet Take 25 mg by mouth 2 (two) times daily with a meal.     . furosemide (LASIX) 40 MG tablet Take 1 tablet by mouth daily for 5 days then go down to 1 tablet by mouth every other day 90 tablet 3  . Multiple Vitamin (MULTIVITAMIN WITH MINERALS) TABS tablet Take 1 tablet by mouth at bedtime.    . olmesartan (BENICAR) 40 MG tablet Take 0.5 tablets (20 mg total)   by mouth daily.    Vladimir Faster Glycol-Propyl Glycol (SYSTANE OP) Place 1-2 drops into both eyes 3 (three) times daily as needed (for dry eyes.).    Marland Kitchen rosuvastatin (CRESTOR) 10 MG tablet Take 1 tablet (10 mg total) by mouth daily. 90 tablet 3  . aspirin EC 81 MG tablet Take 1 tablet (81 mg total) daily by mouth. 90 tablet 3   No current facility-administered medications for this visit.     Allergies  Allergen Reactions  . No Known Allergies     Social History   Socioeconomic History  . Marital status: Single    Spouse name: Not on file    . Number of children: 1  . Years of education: Not on file  . Highest education level: Not on file  Social Needs  . Financial resource strain: Not on file  . Food insecurity - worry: Not on file  . Food insecurity - inability: Not on file  . Transportation needs - medical: Not on file  . Transportation needs - non-medical: Not on file  Occupational History  . Occupation: Geophysicist/field seismologist: UNEMPLOYED  Tobacco Use  . Smoking status: Former Smoker    Packs/day: 0.25    Years: 40.00    Pack years: 10.00    Types: Cigarettes    Last attempt to quit: 09/25/2010    Years since quitting: 6.8  . Smokeless tobacco: Never Used  . Tobacco comment: patient says she smokes 4 cigarettes a day  Substance and Sexual Activity  . Alcohol use: Yes    Alcohol/week: 0.0 oz    Comment: occasional  . Drug use: No  . Sexual activity: Not on file  Other Topics Concern  . Not on file  Social History Narrative  . Not on file    Family History  Problem Relation Age of Onset  . Pancreatic cancer Mother   . Colon cancer Maternal Aunt   . Bone cancer Brother   . Heart attack Brother   . Hypercalcemia Neg Hx     Review of Systems:  As stated in the HPI and otherwise negative.   BP 118/70   Pulse 77   Ht 5' 3.5" (1.613 m)   Wt 144 lb 12.8 oz (65.7 kg)   SpO2 99%   BMI 25.25 kg/m   Physical Examination:  General: Well developed, well nourished, NAD  HEENT: OP clear, mucus membranes moist  SKIN: warm, dry. No rashes. Neuro: No focal deficits  Musculoskeletal: Muscle strength 5/5 all ext  Psychiatric: Mood and affect normal  Neck: No JVD, no carotid bruits, no thyromegaly, no lymphadenopathy.  Lungs:Clear bilaterally, no wheezes, rhonci, crackles Cardiovascular: Regular rate and rhythm. No murmurs, gallops or rubs. Abdomen:Soft. Bowel sounds present. Non-tender.  Extremities: No lower extremity edema. Pulses are 2 + in the bilateral DP/PT.  Echo 07/11/17: - Left  ventricle: The cavity size was normal. Wall thickness was   increased in a pattern of moderate LVH. Systolic function was   normal. The estimated ejection fraction was in the range of 55%   to 60%. There was dynamic obstruction, with a peak velocity of   151 cm/sec and a peak gradient of 9 mm Hg. Wall motion was   normal; there were no regional wall motion abnormalities. Doppler   parameters are consistent with abnormal left ventricular   relaxation (grade 1 diastolic dysfunction). Doppler parameters   are consistent with high ventricular filling pressure. - Aortic valve: A 19 mm  Edwards Magna Ease bioprosthesis was   present. Peak velocity (S): 268 cm/s. Mean gradient (S): 14 mm   Hg. - Aorta: Ascending aortic diameter: 40 mm (S). - Ascending aorta: The ascending aorta was mildly dilated. - Mitral valve: Calcified annulus. Transvalvular velocity was   within the normal range. There was no evidence for stenosis.   There was no regurgitation. - Right ventricle: The cavity size was normal. Wall thickness was   normal. Systolic function was moderately reduced. - Atrial septum: No defect or patent foramen ovale was identified. - Tricuspid valve: There was mild regurgitation. - Pulmonary arteries: Systolic pressure was within the normal   range. PA peak pressure: 14 mm Hg (S).   EKG:  EKG is not ordered today.  Recent Labs: 11/14/2016: ALT 35 11/17/2016: Magnesium 1.9 07/06/2017: BUN 12; Creatinine, Ser 0.96; Potassium 4.2; Sodium 139 07/17/2017: Hemoglobin 16.1; Platelets 138    Wt Readings from Last 3 Encounters:  08/10/17 144 lb 12.8 oz (65.7 kg)  07/05/17 144 lb (65.3 kg)  05/30/17 139 lb 12.4 oz (63.4 kg)    Other studies Reviewed: Additional studies/ records that were reviewed today include: Echo images. I personally reviewed. . Review of the above records demonstrates: severe AS  Assessment and Plan:   1. Aortic stenosis: She is s/p AVR February 2018. She is on ASA 325 mg  daily. Will decrease to ASA 81 mg daily.   2. Chronic diastolic CHF: Continue Lasix. Weight stable. No LE edema.   3. CAD without angina: She has mild CAD. She is having no chest pain. Will continue medical management with ASA, statin and beta blocker.   4. HTN: BP is controlled. No changes. Followed in HTN clinic.   5. HLD: Check lipids and LFTs now. Continue statin.   6. Tobacco use: Cessation is recommended. She is still smoking 3-4 cigarettes per day.   7. NICM: Continue medical therapy.LVEF is normal by echo in October 2018.    Current medicines are reviewed at length with the patient today.  The patient does not have concerns regarding medicines.  The following changes have been made:  no change  Labs/ tests ordered today include:   Orders Placed This Encounter  Procedures  . Lipid Profile  . Hepatic function panel    Disposition:   FU with me in 12 months  Signed, Christopher McAlhany, MD 08/10/2017 1:24 PM    [facility] [address], [place], [place]  [address] Phone: [phone]; Fax: [phone]   

## 2017-08-10 ENCOUNTER — Ambulatory Visit (INDEPENDENT_AMBULATORY_CARE_PROVIDER_SITE_OTHER): Payer: Medicare HMO | Admitting: Cardiovascular Disease

## 2017-08-10 ENCOUNTER — Encounter: Payer: Self-pay | Admitting: Cardiovascular Disease

## 2017-08-10 VITALS — BP 118/70 | HR 77 | Ht 63.5 in | Wt 144.8 lb

## 2017-08-10 DIAGNOSIS — I359 Nonrheumatic aortic valve disorder, unspecified: Secondary | ICD-10-CM

## 2017-08-10 DIAGNOSIS — I5032 Chronic diastolic (congestive) heart failure: Secondary | ICD-10-CM | POA: Diagnosis not present

## 2017-08-10 DIAGNOSIS — I2510 Atherosclerotic heart disease of native coronary artery without angina pectoris: Secondary | ICD-10-CM

## 2017-08-10 DIAGNOSIS — I428 Other cardiomyopathies: Secondary | ICD-10-CM | POA: Diagnosis not present

## 2017-08-10 DIAGNOSIS — I10 Essential (primary) hypertension: Secondary | ICD-10-CM | POA: Diagnosis not present

## 2017-08-10 DIAGNOSIS — E7800 Pure hypercholesterolemia, unspecified: Secondary | ICD-10-CM | POA: Diagnosis not present

## 2017-08-10 MED ORDER — ASPIRIN EC 81 MG PO TBEC: 81.0000 mg | DELAYED_RELEASE_TABLET | Freq: Every day | ORAL | 3 refills | Status: DC

## 2017-08-10 NOTE — Patient Instructions (Addendum)
Medication Instructions:  Your physician has recommended you make the following change in your medication:  Decrease aspirin to 81 mg by mouth daily.    Labwork: Your physician recommends that you return for lab work on November 27,2018.  This will be fasting. The lab opens at 7:30 AM   Testing/Procedures: none  Follow-Up: Your physician recommends that you schedule a follow-up appointment in: 12 months. Please call our office in about 9 months to schedule this appointment    Any Other Special Instructions Will Be Listed Below (If Applicable).     If you need a refill on your cardiac medications before your next appointment, please call your pharmacy.

## 2017-08-21 ENCOUNTER — Other Ambulatory Visit: Payer: Medicare HMO

## 2017-08-28 ENCOUNTER — Other Ambulatory Visit: Payer: Self-pay | Admitting: Cardiovascular Disease

## 2017-08-28 MED ORDER — OLMESARTAN MEDOXOMIL 40 MG PO TABS: 20.0000 mg | ORAL_TABLET | Freq: Every day | ORAL | 3 refills | Status: DC

## 2017-10-03 ENCOUNTER — Ambulatory Visit (INDEPENDENT_AMBULATORY_CARE_PROVIDER_SITE_OTHER): Payer: Self-pay | Admitting: *Deleted

## 2017-10-03 DIAGNOSIS — Z952 Presence of prosthetic heart valve: Secondary | ICD-10-CM | POA: Diagnosis not present

## 2017-10-03 DIAGNOSIS — I10 Essential (primary) hypertension: Secondary | ICD-10-CM | POA: Diagnosis not present

## 2017-10-03 DIAGNOSIS — I428 Other cardiomyopathies: Secondary | ICD-10-CM

## 2017-10-03 DIAGNOSIS — Z79899 Other long term (current) drug therapy: Secondary | ICD-10-CM | POA: Diagnosis not present

## 2017-10-03 DIAGNOSIS — I5022 Chronic systolic (congestive) heart failure: Secondary | ICD-10-CM | POA: Diagnosis not present

## 2017-10-03 DIAGNOSIS — R69 Illness, unspecified: Secondary | ICD-10-CM | POA: Diagnosis not present

## 2017-10-03 DIAGNOSIS — R413 Other amnesia: Secondary | ICD-10-CM | POA: Diagnosis not present

## 2017-10-03 DIAGNOSIS — Z1159 Encounter for screening for other viral diseases: Secondary | ICD-10-CM | POA: Diagnosis not present

## 2017-10-03 DIAGNOSIS — D72819 Decreased white blood cell count, unspecified: Secondary | ICD-10-CM | POA: Diagnosis not present

## 2017-10-03 DIAGNOSIS — L84 Corns and callosities: Secondary | ICD-10-CM | POA: Diagnosis not present

## 2017-10-03 DIAGNOSIS — E7800 Pure hypercholesterolemia, unspecified: Secondary | ICD-10-CM | POA: Diagnosis not present

## 2017-10-03 DIAGNOSIS — Z0001 Encounter for general adult medical examination with abnormal findings: Secondary | ICD-10-CM | POA: Diagnosis not present

## 2017-10-03 DIAGNOSIS — N393 Stress incontinence (female) (male): Secondary | ICD-10-CM | POA: Diagnosis not present

## 2017-10-03 NOTE — Progress Notes (Signed)
Remote ICD transmission.   

## 2017-10-04 ENCOUNTER — Encounter: Payer: Self-pay | Admitting: Cardiology

## 2017-10-05 LAB — CUP PACEART REMOTE DEVICE CHECK
Battery Remaining Longevity: 48 mo
Battery Remaining Percentage: 75 %
Brady Statistic RA Percent Paced: 0 %
Brady Statistic RV Percent Paced: 100 %
Date Time Interrogation Session: 20190109051200
HighPow Impedance: 60 Ohm
Implantable Lead Implant Date: 20060630
Implantable Lead Implant Date: 20060630
Implantable Lead Implant Date: 20060630
Implantable Lead Location: 753858
Implantable Lead Location: 753859
Implantable Lead Location: 753860
Implantable Lead Model: 157
Implantable Lead Model: 4086
Implantable Lead Model: 4543
Implantable Lead Serial Number: 119717
Implantable Lead Serial Number: 131677
Implantable Lead Serial Number: 223436
Implantable Pulse Generator Implant Date: 20120131
Lead Channel Impedance Value: 1384 Ohm
Lead Channel Impedance Value: 440 Ohm
Lead Channel Impedance Value: 536 Ohm
Lead Channel Pacing Threshold Amplitude: 1 V
Lead Channel Pacing Threshold Amplitude: 1.2 V
Lead Channel Pacing Threshold Amplitude: 1.2 V
Lead Channel Pacing Threshold Pulse Width: 0.4 ms
Lead Channel Pacing Threshold Pulse Width: 0.4 ms
Lead Channel Pacing Threshold Pulse Width: 0.4 ms
Lead Channel Setting Pacing Amplitude: 2 V
Lead Channel Setting Pacing Amplitude: 2.2 V
Lead Channel Setting Pacing Amplitude: 2.4 V
Lead Channel Setting Pacing Pulse Width: 0.4 ms
Lead Channel Setting Pacing Pulse Width: 0.4 ms
Lead Channel Setting Sensing Sensitivity: 0.5 mV
Lead Channel Setting Sensing Sensitivity: 1 mV
Pulse Gen Serial Number: 498164

## 2017-10-16 ENCOUNTER — Telehealth: Payer: Self-pay | Admitting: Internal Medicine

## 2017-10-16 NOTE — Telephone Encounter (Signed)
Encounter not needed

## 2017-10-18 ENCOUNTER — Ambulatory Visit: Payer: Self-pay | Admitting: Podiatry

## 2017-10-23 ENCOUNTER — Encounter: Payer: Self-pay | Admitting: Podiatry

## 2017-10-23 ENCOUNTER — Ambulatory Visit (INDEPENDENT_AMBULATORY_CARE_PROVIDER_SITE_OTHER): Payer: Medicare HMO | Admitting: Podiatry

## 2017-10-23 VITALS — BP 142/97 | HR 73 | Resp 16

## 2017-10-23 DIAGNOSIS — Q828 Other specified congenital malformations of skin: Secondary | ICD-10-CM | POA: Diagnosis not present

## 2017-10-23 NOTE — Progress Notes (Signed)
Subjective:  Patient ID: [name], female    DOB: 1956-08-02,  MRN: [id number] HPI Chief Complaint  Patient presents with  . Callouses    Hallux (medial) right and sub 5th MPJ left - callused areas x years, tried to scrape down-no help    62 y.o. female presents with the above complaint.     Past Medical History:  Diagnosis Date  . Abnormal liver function tests   . AICD (automatic cardioverter/defibrillator) present    [employer]  . Anxiety   . Aortic stenosis   . Benign neoplasm of colon 04/19/2001   Hyperplastic  . Bicuspid aortic valve   . Cardiomyopathy secondary   . CHF (congestive heart failure) ([place])   . Coronary artery disease   . Depression   . Headache   . Heart murmur   . HTN (hypertension)   . Hypercalcemia   . Hyperlipidemia   . S/P ICD (internal cardiac defibrillator) procedure    Past Surgical History:  Procedure Laterality Date  . AORTIC VALVE REPLACEMENT N/A 11/16/2016   Procedure: AORTIC VALVE REPLACEMENT (AVR);  Surgeon: Gaye Pollack, MD;  Location: [facility];  Service: Open Heart Surgery;  Laterality: N/A;  5mm Perimount Magna Ease Pericardial Bioprosthesis  . BREAST EXCISIONAL BIOPSY Left 1984  . CARDIAC CATHETERIZATION N/A 07/05/2016   Procedure: Right/Left Heart Cath and Coronary Angiography;  Surgeon: Burnell Blanks, MD;  Location: [facility];  Service: Cardiovascular;  Laterality: N/A;  . CHOLECYSTECTOMY    . implantation of a Guidant biventricular ICD    . laparoscopic cholecystectomy with intraoperative cholangiogram    . TEE WITHOUT CARDIOVERSION N/A 11/16/2016   Procedure: TRANSESOPHAGEAL ECHOCARDIOGRAM (TEE);  Surgeon: Gaye Pollack, MD;  Location: [facility];  Service: Open Heart Surgery;  Laterality: N/A;    Current Outpatient Medications:  .  acetaminophen (TYLENOL) 325 MG tablet, Take 325 mg by mouth every 6 (six) hours as needed for mild pain., Disp: , Rfl:  .  aspirin EC 81 MG tablet, Take 1 tablet (81  mg total) daily by mouth., Disp: 90 tablet, Rfl: 3 .  carvedilol (COREG) 25 MG tablet, Take 25 mg by mouth 2 (two) times daily with a meal. , Disp: , Rfl:  .  furosemide (LASIX) 40 MG tablet, Take 1 tablet by mouth daily for 5 days then go down to 1 tablet by mouth every other day, Disp: 90 tablet, Rfl: 3 .  Multiple Vitamin (MULTIVITAMIN WITH MINERALS) TABS tablet, Take 1 tablet by mouth at bedtime., Disp: , Rfl:  .  olmesartan (BENICAR) 40 MG tablet, Take 0.5 tablets (20 mg total) by mouth daily., Disp: 45 tablet, Rfl: 3 .  Polyethyl Glycol-Propyl Glycol (SYSTANE OP), Place 1-2 drops into both eyes 3 (three) times daily as needed (for dry eyes.)., Disp: , Rfl:  .  rosuvastatin (CRESTOR) 10 MG tablet, Take 1 tablet (10 mg total) by mouth daily., Disp: 90 tablet, Rfl: 3  Allergies  Allergen Reactions  . No Known Allergies    Review of Systems  Constitutional: Positive for fatigue.  Musculoskeletal: Positive for gait problem.  All other systems reviewed and are negative.  Objective:   Vitals:   10/23/17 1024  BP: (!) 142/97  Pulse: 73  Resp: 16    General: Well developed, nourished, in no acute distress, alert and oriented x3   Dermatological: Skin is warm, dry and supple bilateral. Nails x 10 are well maintained; remaining integument appears unremarkable at this time. There  are no open sores, no preulcerative lesions, no rash or signs of infection present.   porokeratosis plantar medial aspect of the hallux right non-complicated.  She also has a larger porokeratotic lesion sub-fifth metatarsal head of the left foot non-complicated.  Vascular: Dorsalis Pedis artery and Posterior Tibial artery pedal pulses are 2/4 bilateral with immedate capillary fill time. Pedal hair growth present. No varicosities and no lower extremity edema present bilateral.   Neruologic: Grossly intact via light touch bilateral. Vibratory intact via tuning fork bilateral. Protective threshold with Semmes  Wienstein monofilament intact to all pedal sites bilateral. Patellar and Achilles deep tendon reflexes 2+ bilateral. No Babinski or clonus noted bilateral.   Musculoskeletal: No gross boney pedal deformities bilateral. No pain, crepitus, or limitation noted with foot and ankle range of motion bilateral. Muscular strength 5/5 in all groups tested bilateral.  Gait: Unassisted, Nonantalgic.    Radiographs:  None taken  Assessment & Plan:   Assessment: Painful porokeratosis plantar medial hallux right sub-fifth metatarsal head left.  Plan: Discussed etiology pathology conservative versus surgical therapies.  At this point sharply debrided the lesions today with no anesthetic.  I then placed salicylic acid under occlusion she will wash this off thoroughly in 3 days.  I will follow-up with her in 1 month for re-debridement.     Mead [address] [place], [place]

## 2017-11-13 ENCOUNTER — Other Ambulatory Visit: Payer: Medicare HMO

## 2017-11-20 ENCOUNTER — Encounter: Payer: Self-pay | Admitting: Podiatry

## 2017-11-20 ENCOUNTER — Ambulatory Visit: Payer: Medicare HMO | Admitting: Podiatry

## 2017-11-20 DIAGNOSIS — D2371 Other benign neoplasm of skin of right lower limb, including hip: Secondary | ICD-10-CM | POA: Diagnosis not present

## 2017-11-20 DIAGNOSIS — Q828 Other specified congenital malformations of skin: Secondary | ICD-10-CM | POA: Diagnosis not present

## 2017-11-20 NOTE — Progress Notes (Signed)
She presents today for follow-up of her porokeratosis plantar aspect of the right hallux and sub-fifth metatarsal head left.  He states that she is doing so much better.  Objective: Vital signs are stable alert and oriented x3.  Pulses are palpable.  Neurologic sensorium is intact.  Much smaller reactive hyperkeratotic lesion plantar medial aspect of the right hallux.  Much more shallow and resolved lesion sub-fifth met head left.  No open lesions or wounds are noted.  Assessment: Slowly resolving porokeratosis of benign hyperkeratotic lesion.  Plan: Debridement of benign hyperkeratotic lesion placed salicylic acid under occlusion to be left on for 3 days without getting wet.  She understands this is amenable to it we will follow-up with me once again in 6 weeks 

## 2017-12-11 DIAGNOSIS — R3915 Urgency of urination: Secondary | ICD-10-CM | POA: Diagnosis not present

## 2017-12-11 DIAGNOSIS — N3941 Urge incontinence: Secondary | ICD-10-CM | POA: Diagnosis not present

## 2017-12-17 DIAGNOSIS — D229 Melanocytic nevi, unspecified: Secondary | ICD-10-CM | POA: Diagnosis not present

## 2017-12-17 DIAGNOSIS — L821 Other seborrheic keratosis: Secondary | ICD-10-CM | POA: Diagnosis not present

## 2017-12-17 DIAGNOSIS — L304 Erythema intertrigo: Secondary | ICD-10-CM | POA: Diagnosis not present

## 2017-12-18 ENCOUNTER — Ambulatory Visit: Payer: Medicare HMO | Admitting: Podiatry

## 2017-12-24 ENCOUNTER — Encounter: Payer: Self-pay | Admitting: Gastroenterology

## 2017-12-27 DIAGNOSIS — D582 Other hemoglobinopathies: Secondary | ICD-10-CM | POA: Diagnosis not present

## 2017-12-27 DIAGNOSIS — F411 Generalized anxiety disorder: Secondary | ICD-10-CM | POA: Diagnosis not present

## 2017-12-27 DIAGNOSIS — R69 Illness, unspecified: Secondary | ICD-10-CM | POA: Diagnosis not present

## 2017-12-27 DIAGNOSIS — R6889 Other general symptoms and signs: Secondary | ICD-10-CM | POA: Diagnosis not present

## 2017-12-27 DIAGNOSIS — N393 Stress incontinence (female) (male): Secondary | ICD-10-CM | POA: Diagnosis not present

## 2017-12-27 DIAGNOSIS — Z79899 Other long term (current) drug therapy: Secondary | ICD-10-CM | POA: Diagnosis not present

## 2017-12-27 DIAGNOSIS — I10 Essential (primary) hypertension: Secondary | ICD-10-CM | POA: Diagnosis not present

## 2017-12-27 DIAGNOSIS — Z952 Presence of prosthetic heart valve: Secondary | ICD-10-CM | POA: Diagnosis not present

## 2017-12-27 DIAGNOSIS — I5022 Chronic systolic (congestive) heart failure: Secondary | ICD-10-CM | POA: Diagnosis not present

## 2018-01-01 ENCOUNTER — Encounter: Payer: Self-pay | Admitting: Neurology

## 2018-01-02 ENCOUNTER — Telehealth: Payer: Self-pay | Admitting: Cardiology

## 2018-01-02 ENCOUNTER — Ambulatory Visit (INDEPENDENT_AMBULATORY_CARE_PROVIDER_SITE_OTHER): Payer: Self-pay | Admitting: *Deleted

## 2018-01-02 DIAGNOSIS — I428 Other cardiomyopathies: Secondary | ICD-10-CM

## 2018-01-02 NOTE — Telephone Encounter (Signed)
LMOVM reminding pt to send remote transmission.   

## 2018-01-03 ENCOUNTER — Encounter: Payer: Self-pay | Admitting: Cardiology

## 2018-01-03 NOTE — Progress Notes (Signed)
Remote ICD transmission.   

## 2018-01-09 DIAGNOSIS — R69 Illness, unspecified: Secondary | ICD-10-CM | POA: Diagnosis not present

## 2018-01-09 DIAGNOSIS — I10 Essential (primary) hypertension: Secondary | ICD-10-CM | POA: Diagnosis not present

## 2018-01-09 DIAGNOSIS — Z79899 Other long term (current) drug therapy: Secondary | ICD-10-CM | POA: Diagnosis not present

## 2018-01-09 DIAGNOSIS — N3281 Overactive bladder: Secondary | ICD-10-CM | POA: Diagnosis not present

## 2018-01-09 DIAGNOSIS — R600 Localized edema: Secondary | ICD-10-CM | POA: Diagnosis not present

## 2018-01-30 DIAGNOSIS — R6889 Other general symptoms and signs: Secondary | ICD-10-CM | POA: Diagnosis not present

## 2018-01-30 DIAGNOSIS — R258 Other abnormal involuntary movements: Secondary | ICD-10-CM | POA: Diagnosis not present

## 2018-01-30 DIAGNOSIS — D72819 Decreased white blood cell count, unspecified: Secondary | ICD-10-CM | POA: Diagnosis not present

## 2018-02-08 LAB — CUP PACEART REMOTE DEVICE CHECK
Battery Remaining Longevity: 48 mo
Battery Remaining Percentage: 73 %
Brady Statistic RA Percent Paced: 0 %
Brady Statistic RV Percent Paced: 100 %
Date Time Interrogation Session: 20190410230200
HighPow Impedance: 57 Ohm
Implantable Lead Implant Date: 20060630
Implantable Lead Implant Date: 20060630
Implantable Lead Implant Date: 20060630
Implantable Lead Location: 753858
Implantable Lead Location: 753859
Implantable Lead Location: 753860
Implantable Lead Model: 157
Implantable Lead Model: 4086
Implantable Lead Model: 4543
Implantable Lead Serial Number: 119717
Implantable Lead Serial Number: 131677
Implantable Lead Serial Number: 223436
Implantable Pulse Generator Implant Date: 20120131
Lead Channel Impedance Value: 1354 Ohm
Lead Channel Impedance Value: 424 Ohm
Lead Channel Impedance Value: 515 Ohm
Lead Channel Pacing Threshold Amplitude: 1 V
Lead Channel Pacing Threshold Amplitude: 1.2 V
Lead Channel Pacing Threshold Amplitude: 1.2 V
Lead Channel Pacing Threshold Pulse Width: 0.4 ms
Lead Channel Pacing Threshold Pulse Width: 0.4 ms
Lead Channel Pacing Threshold Pulse Width: 0.4 ms
Lead Channel Setting Pacing Amplitude: 2 V
Lead Channel Setting Pacing Amplitude: 2.2 V
Lead Channel Setting Pacing Amplitude: 2.4 V
Lead Channel Setting Pacing Pulse Width: 0.4 ms
Lead Channel Setting Pacing Pulse Width: 0.4 ms
Lead Channel Setting Sensing Sensitivity: 0.5 mV
Lead Channel Setting Sensing Sensitivity: 1 mV
Pulse Gen Serial Number: 498164

## 2018-02-14 DIAGNOSIS — R69 Illness, unspecified: Secondary | ICD-10-CM | POA: Diagnosis not present

## 2018-02-14 DIAGNOSIS — N393 Stress incontinence (female) (male): Secondary | ICD-10-CM | POA: Diagnosis not present

## 2018-02-14 DIAGNOSIS — Z7982 Long term (current) use of aspirin: Secondary | ICD-10-CM | POA: Diagnosis not present

## 2018-02-14 DIAGNOSIS — I429 Cardiomyopathy, unspecified: Secondary | ICD-10-CM | POA: Diagnosis not present

## 2018-02-14 DIAGNOSIS — I5022 Chronic systolic (congestive) heart failure: Secondary | ICD-10-CM | POA: Diagnosis not present

## 2018-02-14 DIAGNOSIS — I110 Hypertensive heart disease with heart failure: Secondary | ICD-10-CM | POA: Diagnosis not present

## 2018-02-14 DIAGNOSIS — N3281 Overactive bladder: Secondary | ICD-10-CM | POA: Diagnosis not present

## 2018-02-14 DIAGNOSIS — Z95810 Presence of automatic (implantable) cardiac defibrillator: Secondary | ICD-10-CM | POA: Diagnosis not present

## 2018-02-14 DIAGNOSIS — E7800 Pure hypercholesterolemia, unspecified: Secondary | ICD-10-CM | POA: Diagnosis not present

## 2018-02-17 DIAGNOSIS — E7800 Pure hypercholesterolemia, unspecified: Secondary | ICD-10-CM | POA: Diagnosis not present

## 2018-02-17 DIAGNOSIS — I5022 Chronic systolic (congestive) heart failure: Secondary | ICD-10-CM | POA: Diagnosis not present

## 2018-02-17 DIAGNOSIS — R69 Illness, unspecified: Secondary | ICD-10-CM | POA: Diagnosis not present

## 2018-02-17 DIAGNOSIS — Z7982 Long term (current) use of aspirin: Secondary | ICD-10-CM | POA: Diagnosis not present

## 2018-02-17 DIAGNOSIS — I110 Hypertensive heart disease with heart failure: Secondary | ICD-10-CM | POA: Diagnosis not present

## 2018-02-17 DIAGNOSIS — N393 Stress incontinence (female) (male): Secondary | ICD-10-CM | POA: Diagnosis not present

## 2018-02-17 DIAGNOSIS — I429 Cardiomyopathy, unspecified: Secondary | ICD-10-CM | POA: Diagnosis not present

## 2018-02-17 DIAGNOSIS — Z95810 Presence of automatic (implantable) cardiac defibrillator: Secondary | ICD-10-CM | POA: Diagnosis not present

## 2018-02-17 DIAGNOSIS — N3281 Overactive bladder: Secondary | ICD-10-CM | POA: Diagnosis not present

## 2018-02-22 DIAGNOSIS — N3281 Overactive bladder: Secondary | ICD-10-CM | POA: Diagnosis not present

## 2018-02-22 DIAGNOSIS — I5022 Chronic systolic (congestive) heart failure: Secondary | ICD-10-CM | POA: Diagnosis not present

## 2018-02-22 DIAGNOSIS — Z95810 Presence of automatic (implantable) cardiac defibrillator: Secondary | ICD-10-CM | POA: Diagnosis not present

## 2018-02-22 DIAGNOSIS — Z7982 Long term (current) use of aspirin: Secondary | ICD-10-CM | POA: Diagnosis not present

## 2018-02-22 DIAGNOSIS — I110 Hypertensive heart disease with heart failure: Secondary | ICD-10-CM | POA: Diagnosis not present

## 2018-02-22 DIAGNOSIS — N393 Stress incontinence (female) (male): Secondary | ICD-10-CM | POA: Diagnosis not present

## 2018-02-22 DIAGNOSIS — E7800 Pure hypercholesterolemia, unspecified: Secondary | ICD-10-CM | POA: Diagnosis not present

## 2018-02-22 DIAGNOSIS — I429 Cardiomyopathy, unspecified: Secondary | ICD-10-CM | POA: Diagnosis not present

## 2018-02-22 DIAGNOSIS — R69 Illness, unspecified: Secondary | ICD-10-CM | POA: Diagnosis not present

## 2018-02-25 DIAGNOSIS — Z7982 Long term (current) use of aspirin: Secondary | ICD-10-CM | POA: Diagnosis not present

## 2018-02-25 DIAGNOSIS — Z95810 Presence of automatic (implantable) cardiac defibrillator: Secondary | ICD-10-CM | POA: Diagnosis not present

## 2018-02-25 DIAGNOSIS — I429 Cardiomyopathy, unspecified: Secondary | ICD-10-CM | POA: Diagnosis not present

## 2018-02-25 DIAGNOSIS — I5022 Chronic systolic (congestive) heart failure: Secondary | ICD-10-CM | POA: Diagnosis not present

## 2018-02-25 DIAGNOSIS — R69 Illness, unspecified: Secondary | ICD-10-CM | POA: Diagnosis not present

## 2018-02-25 DIAGNOSIS — N3281 Overactive bladder: Secondary | ICD-10-CM | POA: Diagnosis not present

## 2018-02-25 DIAGNOSIS — N393 Stress incontinence (female) (male): Secondary | ICD-10-CM | POA: Diagnosis not present

## 2018-02-25 DIAGNOSIS — I110 Hypertensive heart disease with heart failure: Secondary | ICD-10-CM | POA: Diagnosis not present

## 2018-02-25 DIAGNOSIS — E7800 Pure hypercholesterolemia, unspecified: Secondary | ICD-10-CM | POA: Diagnosis not present

## 2018-02-28 DIAGNOSIS — I5022 Chronic systolic (congestive) heart failure: Secondary | ICD-10-CM | POA: Diagnosis not present

## 2018-02-28 DIAGNOSIS — N3281 Overactive bladder: Secondary | ICD-10-CM | POA: Diagnosis not present

## 2018-02-28 DIAGNOSIS — I429 Cardiomyopathy, unspecified: Secondary | ICD-10-CM | POA: Diagnosis not present

## 2018-02-28 DIAGNOSIS — N393 Stress incontinence (female) (male): Secondary | ICD-10-CM | POA: Diagnosis not present

## 2018-02-28 DIAGNOSIS — R69 Illness, unspecified: Secondary | ICD-10-CM | POA: Diagnosis not present

## 2018-02-28 DIAGNOSIS — Z7982 Long term (current) use of aspirin: Secondary | ICD-10-CM | POA: Diagnosis not present

## 2018-02-28 DIAGNOSIS — Z95810 Presence of automatic (implantable) cardiac defibrillator: Secondary | ICD-10-CM | POA: Diagnosis not present

## 2018-02-28 DIAGNOSIS — E7800 Pure hypercholesterolemia, unspecified: Secondary | ICD-10-CM | POA: Diagnosis not present

## 2018-02-28 DIAGNOSIS — I110 Hypertensive heart disease with heart failure: Secondary | ICD-10-CM | POA: Diagnosis not present

## 2018-03-04 DIAGNOSIS — N3281 Overactive bladder: Secondary | ICD-10-CM | POA: Diagnosis not present

## 2018-03-04 DIAGNOSIS — I429 Cardiomyopathy, unspecified: Secondary | ICD-10-CM | POA: Diagnosis not present

## 2018-03-04 DIAGNOSIS — R69 Illness, unspecified: Secondary | ICD-10-CM | POA: Diagnosis not present

## 2018-03-04 DIAGNOSIS — I5022 Chronic systolic (congestive) heart failure: Secondary | ICD-10-CM | POA: Diagnosis not present

## 2018-03-04 DIAGNOSIS — Z7982 Long term (current) use of aspirin: Secondary | ICD-10-CM | POA: Diagnosis not present

## 2018-03-04 DIAGNOSIS — I110 Hypertensive heart disease with heart failure: Secondary | ICD-10-CM | POA: Diagnosis not present

## 2018-03-04 DIAGNOSIS — E7800 Pure hypercholesterolemia, unspecified: Secondary | ICD-10-CM | POA: Diagnosis not present

## 2018-03-04 DIAGNOSIS — Z95810 Presence of automatic (implantable) cardiac defibrillator: Secondary | ICD-10-CM | POA: Diagnosis not present

## 2018-03-04 DIAGNOSIS — N393 Stress incontinence (female) (male): Secondary | ICD-10-CM | POA: Diagnosis not present

## 2018-03-08 DIAGNOSIS — I110 Hypertensive heart disease with heart failure: Secondary | ICD-10-CM | POA: Diagnosis not present

## 2018-03-08 DIAGNOSIS — Z95810 Presence of automatic (implantable) cardiac defibrillator: Secondary | ICD-10-CM | POA: Diagnosis not present

## 2018-03-08 DIAGNOSIS — Z7982 Long term (current) use of aspirin: Secondary | ICD-10-CM | POA: Diagnosis not present

## 2018-03-08 DIAGNOSIS — I429 Cardiomyopathy, unspecified: Secondary | ICD-10-CM | POA: Diagnosis not present

## 2018-03-08 DIAGNOSIS — I5022 Chronic systolic (congestive) heart failure: Secondary | ICD-10-CM | POA: Diagnosis not present

## 2018-03-08 DIAGNOSIS — E7800 Pure hypercholesterolemia, unspecified: Secondary | ICD-10-CM | POA: Diagnosis not present

## 2018-03-08 DIAGNOSIS — R69 Illness, unspecified: Secondary | ICD-10-CM | POA: Diagnosis not present

## 2018-03-08 DIAGNOSIS — N393 Stress incontinence (female) (male): Secondary | ICD-10-CM | POA: Diagnosis not present

## 2018-03-08 DIAGNOSIS — N3281 Overactive bladder: Secondary | ICD-10-CM | POA: Diagnosis not present

## 2018-03-11 DIAGNOSIS — M549 Dorsalgia, unspecified: Secondary | ICD-10-CM | POA: Diagnosis not present

## 2018-03-11 DIAGNOSIS — R309 Painful micturition, unspecified: Secondary | ICD-10-CM | POA: Diagnosis not present

## 2018-03-11 DIAGNOSIS — K5900 Constipation, unspecified: Secondary | ICD-10-CM | POA: Diagnosis not present

## 2018-03-12 DIAGNOSIS — I5022 Chronic systolic (congestive) heart failure: Secondary | ICD-10-CM | POA: Diagnosis not present

## 2018-03-12 DIAGNOSIS — I429 Cardiomyopathy, unspecified: Secondary | ICD-10-CM | POA: Diagnosis not present

## 2018-03-12 DIAGNOSIS — Z7982 Long term (current) use of aspirin: Secondary | ICD-10-CM | POA: Diagnosis not present

## 2018-03-12 DIAGNOSIS — N3281 Overactive bladder: Secondary | ICD-10-CM | POA: Diagnosis not present

## 2018-03-12 DIAGNOSIS — I110 Hypertensive heart disease with heart failure: Secondary | ICD-10-CM | POA: Diagnosis not present

## 2018-03-12 DIAGNOSIS — Z95810 Presence of automatic (implantable) cardiac defibrillator: Secondary | ICD-10-CM | POA: Diagnosis not present

## 2018-03-12 DIAGNOSIS — N393 Stress incontinence (female) (male): Secondary | ICD-10-CM | POA: Diagnosis not present

## 2018-03-12 DIAGNOSIS — E7800 Pure hypercholesterolemia, unspecified: Secondary | ICD-10-CM | POA: Diagnosis not present

## 2018-03-12 DIAGNOSIS — R69 Illness, unspecified: Secondary | ICD-10-CM | POA: Diagnosis not present

## 2018-03-18 DIAGNOSIS — N393 Stress incontinence (female) (male): Secondary | ICD-10-CM | POA: Diagnosis not present

## 2018-03-18 DIAGNOSIS — I110 Hypertensive heart disease with heart failure: Secondary | ICD-10-CM | POA: Diagnosis not present

## 2018-03-18 DIAGNOSIS — E7800 Pure hypercholesterolemia, unspecified: Secondary | ICD-10-CM | POA: Diagnosis not present

## 2018-03-18 DIAGNOSIS — I429 Cardiomyopathy, unspecified: Secondary | ICD-10-CM | POA: Diagnosis not present

## 2018-03-18 DIAGNOSIS — Z7982 Long term (current) use of aspirin: Secondary | ICD-10-CM | POA: Diagnosis not present

## 2018-03-18 DIAGNOSIS — R69 Illness, unspecified: Secondary | ICD-10-CM | POA: Diagnosis not present

## 2018-03-18 DIAGNOSIS — N3281 Overactive bladder: Secondary | ICD-10-CM | POA: Diagnosis not present

## 2018-03-18 DIAGNOSIS — I5022 Chronic systolic (congestive) heart failure: Secondary | ICD-10-CM | POA: Diagnosis not present

## 2018-03-18 DIAGNOSIS — Z95810 Presence of automatic (implantable) cardiac defibrillator: Secondary | ICD-10-CM | POA: Diagnosis not present

## 2018-03-26 ENCOUNTER — Ambulatory Visit: Payer: Medicare HMO | Admitting: Neurology

## 2018-03-26 ENCOUNTER — Encounter: Payer: Self-pay | Admitting: Neurology

## 2018-03-26 ENCOUNTER — Other Ambulatory Visit: Payer: Self-pay

## 2018-03-26 ENCOUNTER — Other Ambulatory Visit: Payer: Medicare HMO

## 2018-03-26 VITALS — BP 112/84 | HR 71 | Ht 64.0 in | Wt 132.0 lb

## 2018-03-26 DIAGNOSIS — Z7189 Other specified counseling: Secondary | ICD-10-CM

## 2018-03-26 DIAGNOSIS — F329 Major depressive disorder, single episode, unspecified: Secondary | ICD-10-CM

## 2018-03-26 DIAGNOSIS — F32A Depression, unspecified: Secondary | ICD-10-CM

## 2018-03-26 DIAGNOSIS — G3184 Mild cognitive impairment, so stated: Secondary | ICD-10-CM

## 2018-03-26 DIAGNOSIS — R69 Illness, unspecified: Secondary | ICD-10-CM | POA: Diagnosis not present

## 2018-03-26 NOTE — Patient Instructions (Signed)
1. Schedule MRI brain with and without contrast 2. Bloodwork for TSH, B12 3. Increase Zoloft to 100mg  daily 4. Start planning for the future by discussing with family members Power of [place], etc 5. Follow-up in 3 months, call for any changes.  FALL PRECAUTIONS: Be cautious when walking. Scan the area for obstacles that may increase the risk of trips and falls. When getting up in the mornings, sit up at the edge of the bed for a few minutes before getting out of bed. Consider elevating the bed at the head end to avoid drop of blood pressure when getting up. Walk always in a well-lit room (use night lights in the walls). Avoid area rugs or power cords from appliances in the middle of the walkways. Use a walker or a cane if necessary and consider physical therapy for balance exercise. Get your eyesight checked regularly.  FINANCIAL OVERSIGHT: Supervision, especially oversight when making financial decisions or transactions is also recommended.  HOME SAFETY: Consider the safety of the kitchen when operating appliances like stoves, microwave oven, and blender. Consider having supervision and share cooking responsibilities until no longer able to participate in those. Accidents with firearms and other hazards in the house should be identified and addressed as well.  DRIVING: Regarding driving, in patients with progressive memory problems, driving will be impaired. We advise to have someone else do the driving if trouble finding directions or if minor accidents are reported. Independent driving assessment is available to determine safety of driving.  ABILITY TO BE LEFT ALONE: If patient is unable to contact 911 operator, consider using LifeLine, or when the need is there, arrange for someone to stay with patients. Smoking is a fire hazard, consider supervision or cessation. Risk of wandering should be assessed by caregiver and if detected at any point, supervision and safe proof recommendations should be  instituted.  MEDICATION SUPERVISION: Inability to self-administer medication needs to be constantly addressed. Implement a mechanism to ensure safe administration of the medications.  RECOMMENDATIONS FOR ALL PATIENTS WITH MEMORY PROBLEMS: 1. Continue to exercise (Recommend 30 minutes of walking everyday, or 3 hours every week) 2. Increase social interactions - continue going to [place] and enjoy social gatherings with friends and family 3. Eat healthy, avoid fried foods and eat more fruits and vegetables 4. Maintain adequate blood pressure, blood sugar, and blood cholesterol level. Reducing the risk of stroke and cardiovascular disease also helps promoting better memory. 5. Avoid stressful situations. Live a simple life and avoid aggravations. Organize your time and prepare for the next day in anticipation. 6. Sleep well, avoid any interruptions of sleep and avoid any distractions in the bedroom that may interfere with adequate sleep quality 7. Avoid sugar, avoid sweets as there is a strong link between excessive sugar intake, diabetes, and cognitive impairment We discussed the Mediterranean diet, which has been shown to help patients reduce the risk of progressive memory disorders and reduces cardiovascular risk. This includes eating fish, eat fruits and green leafy vegetables, nuts like almonds and hazelnuts, walnuts, and also use olive oil. Avoid fast foods and fried foods as much as possible. Avoid sweets and sugar as sugar use has been linked to worsening of memory function.  There is always a concern of gradual progression of memory problems. If this is the case, then we may need to adjust level of care according to patient needs. Support, both to the patient and caregiver, should then be put into place.

## 2018-03-26 NOTE — Progress Notes (Signed)
NEUROLOGY CONSULTATION NOTE  [name] MRN: [id number] DOB: Nov 19, 1955  Referring provider: Dr. Lujean Amel Primary care provider: Dr. Lujean Amel  Reason for consult:  Forgetfulness  Dear Dr Dorthy Cooler:  Thank you for your kind referral of [name] for consultation of the above symptoms. Although her history is well known to you, please allow me to reiterate it for the purpose of our medical record. She is alone in the office today. Records and images were personally reviewed where available.  HISTORY OF PRESENT ILLNESS: This is a pleasant 62 year old right-handed woman with a history of hypertension, hyperlipidemia, cardiomyopathy s/p ICD placement, severe aortic stenosis s/p AVR, presenting for evaluation of concern for memory changes. She states her memory on a scale of 1 to 5 is a 2.5. She noticed changes herself only recently, she feels like changes started since her birthday last May. She lives alone and denies getting lost driving, but states it is time for her to stop driving because she has no upper body strength. She has noticed she makes wide turns. She denies any missed bills or medications. She loses things a lot at home. She denies leaving the stove on. No word-finding difficulties. She is a retired Financial planner. She has not noticed any speech changes. She endorses feeling depressed a lot this year, there have been several deaths she does not think she has dealt with well. She is taking Zoloft 50mg  daily without side effects but has not noticed much difference. Sleep is good. Her appetite is good but she can go without eating for a while. Her son and brother live locally but she does not see them frequently. She used to walk a lot and go to church regularly but stopped doing these last year.   She has numbness in both feet. She has had bladder leakage for a year. She has noticed severe constipation for the past 6 months. She denies any headaches,  dizziness, diplopia, dysarthria/dysphagia, neck/back pain, anosmia, or tremors. No falls. No family history of dementia. She denies any history of significant head injuries, no alcohol use.   Laboratory Data: 12/27/17: TSH 1.53  PAST MEDICAL HISTORY: Past Medical History:  Diagnosis Date  . Abnormal liver function tests   . AICD (automatic cardioverter/defibrillator) present    [employer]  . Anxiety   . Aortic stenosis   . Benign neoplasm of colon 04/19/2001   Hyperplastic  . Bicuspid aortic valve   . Cardiomyopathy secondary   . CHF (congestive heart failure) ([place])   . Coronary artery disease   . Depression   . Headache   . Heart murmur   . HTN (hypertension)   . Hypercalcemia   . Hyperlipidemia   . S/P ICD (internal cardiac defibrillator) procedure     PAST SURGICAL HISTORY: Past Surgical History:  Procedure Laterality Date  . AORTIC VALVE REPLACEMENT N/A 11/16/2016   Procedure: AORTIC VALVE REPLACEMENT (AVR);  Surgeon: Gaye Pollack, MD;  Location: [facility];  Service: Open Heart Surgery;  Laterality: N/A;  18mm Perimount Magna Ease Pericardial Bioprosthesis  . BREAST EXCISIONAL BIOPSY Left 1984  . CARDIAC CATHETERIZATION N/A 07/05/2016   Procedure: Right/Left Heart Cath and Coronary Angiography;  Surgeon: Burnell Blanks, MD;  Location: [facility];  Service: Cardiovascular;  Laterality: N/A;  . CHOLECYSTECTOMY    . implantation of a Guidant biventricular ICD    . laparoscopic cholecystectomy with intraoperative cholangiogram    . TEE WITHOUT CARDIOVERSION N/A  11/16/2016   Procedure: TRANSESOPHAGEAL ECHOCARDIOGRAM (TEE);  Surgeon: Gaye Pollack, MD;  Location: [facility];  Service: Open Heart Surgery;  Laterality: N/A;    MEDICATIONS: Current Outpatient Medications on File Prior to Visit  Medication Sig Dispense Refill  . acetaminophen (TYLENOL) 325 MG tablet Take 325 mg by mouth every 6 (six) hours as needed for mild pain.    Marland Kitchen aspirin EC 81 MG  tablet Take 1 tablet (81 mg total) daily by mouth. 90 tablet 3  . carvedilol (COREG) 25 MG tablet Take 25 mg by mouth 2 (two) times daily with a meal.     . furosemide (LASIX) 40 MG tablet Take 1 tablet by mouth daily for 5 days then go down to 1 tablet by mouth every other day 90 tablet 3  . Multiple Vitamin (MULTIVITAMIN WITH MINERALS) TABS tablet Take 1 tablet by mouth at bedtime.    Marland Kitchen olmesartan (BENICAR) 40 MG tablet Take 0.5 tablets (20 mg total) by mouth daily. 45 tablet 3  . Polyethyl Glycol-Propyl Glycol (SYSTANE OP) Place 1-2 drops into both eyes 3 (three) times daily as needed (for dry eyes.).    Marland Kitchen rosuvastatin (CRESTOR) 10 MG tablet Take 1 tablet (10 mg total) by mouth daily. 90 tablet 3   No current facility-administered medications on file prior to visit.     ALLERGIES: Allergies  Allergen Reactions  . No Known Allergies     FAMILY HISTORY: Family History  Problem Relation Age of Onset  . Pancreatic cancer Mother   . Colon cancer Maternal Aunt   . Bone cancer Brother   . Heart attack Brother   . Hypercalcemia Neg Hx     SOCIAL HISTORY: Social History   Socioeconomic History  . Marital status: Single    Spouse name: Not on file  . Number of children: 1  . Years of education: Not on file  . Highest education level: Not on file  Occupational History  . Occupation: Geophysicist/field seismologist: UNEMPLOYED  Social Needs  . Financial resource strain: Not on file  . Food insecurity:    Worry: Not on file    Inability: Not on file  . Transportation needs:    Medical: Not on file    Non-medical: Not on file  Tobacco Use  . Smoking status: Former Smoker    Packs/day: 0.25    Years: 40.00    Pack years: 10.00    Types: Cigarettes    Last attempt to quit: 09/25/2010    Years since quitting: 7.5  . Smokeless tobacco: Never Used  . Tobacco comment: patient says she smokes 4 cigarettes a day  Substance and Sexual Activity  . Alcohol use: Yes     Alcohol/week: 0.0 oz    Comment: occasional  . Drug use: No  . Sexual activity: Not on file  Lifestyle  . Physical activity:    Days per week: Not on file    Minutes per session: Not on file  . Stress: Not on file  Relationships  . Social connections:    Talks on phone: Not on file    Gets together: Not on file    Attends religious service: Not on file    Active member of club or organization: Not on file    Attends meetings of clubs or organizations: Not on file    Relationship status: Not on file  . Intimate partner violence:    Fear of current or ex partner: Not on  file    Emotionally abused: Not on file    Physically abused: Not on file    Forced sexual activity: Not on file  Other Topics Concern  . Not on file  Social History Narrative  . Not on file    REVIEW OF SYSTEMS: Constitutional: No fevers, chills, or sweats, no generalized fatigue, change in appetite Eyes: No visual changes, double vision, eye pain Ear, nose and throat: No hearing loss, ear pain, nasal congestion, sore throat Cardiovascular: No chest pain, palpitations Respiratory:  No shortness of breath at rest or with exertion, wheezes GastrointestinaI: No nausea, vomiting, diarrhea, abdominal pain, fecal incontinence Genitourinary:  No dysuria, urinary retention or frequency Musculoskeletal:  No neck pain, back pain Integumentary: No rash, pruritus, skin lesions Neurological: as above Psychiatric: No depression, insomnia, anxiety Endocrine: No palpitations, fatigue, diaphoresis, mood swings, change in appetite, change in weight, increased thirst Hematologic/Lymphatic:  No anemia, purpura, petechiae. Allergic/Immunologic: no itchy/runny eyes, nasal congestion, recent allergic reactions, rashes  PHYSICAL EXAM: Vitals:   03/26/18 1443  BP: 112/84  Pulse: 71  SpO2: 96%   General: No acute distress Head:  Normocephalic/atraumatic, slow blink rate with mild exophthalmos Eyes: Fundoscopic exam shows  bilateral sharp discs, no vessel changes, exudates, or hemorrhages Neck: supple, no paraspinal tenderness, full range of motion Back: No paraspinal tenderness Heart: regular rate and rhythm Lungs: Clear to auscultation bilaterally. Vascular: No carotid bruits. Skin/Extremities: No rash, no edema Neurological Exam: Mental status: alert and oriented to person, place, and time, no dysarthria or aphasia but is noted to have slowed responses, Fund of knowledge is appropriate.  Recent and remote memory are intact.  Attention and concentration are normal.    Able to name objects and repeat phrases.  Montreal Cognitive Assessment  03/26/2018  Visuospatial/ Executive (0/5) 3  Naming (0/3) 3  Attention: Read list of digits (0/2) 2  Attention: Read list of letters (0/1) 0  Attention: Serial 7 subtraction starting at 100 (0/3) 0  Language: Repeat phrase (0/2) 2  Language : Fluency (0/1) 0  Abstraction (0/2) 2  Delayed Recall (0/5) 2  Orientation (0/6) 5  Total 19   Cranial nerves: CN I: not tested CN II: pupils equal, round and reactive to light, visual fields intact, fundi unremarkable. CN III, IV, VI:  full range of motion, no nystagmus, no ptosis CN V: facial sensation intact CN VII: upper and lower face symmetric CN VIII: hearing intact to finger rub CN IX, X: gag intact, uvula midline CN XI: sternocleidomastoid and trapezius muscles intact CN XII: tongue midline Bulk & Tone: +cogwheeling bilaterally with bradykinesia, no fasciculations. Motor: 5/5 throughout with no pronator drift. Sensation: intact to light touch, cold, pin, vibration and joint position sense.  No extinction to double simultaneous stimulation.  Romberg test negative Deep Tendon Reflexes: brisk +2 throughout, no ankle clonus Plantar responses: downgoing bilaterally Cerebellar: no incoordination on finger to nose, heel to shin. No dysdiadochokinesia Gait: narrow-based and steady, able to tandem walk adequately. Tremor:  none  IMPRESSION: This is a pleasant 62 year old right-handed woman with a history of  hypertension, hyperlipidemia, cardiomyopathy s/p ICD placement, severe aortic stenosis s/p AVR, presenting for evaluation of concern for memory changes. Her neurological exam shows bradykinesia, slowed responses, cogwheeling, but no other parkinsonian signs. MOCA score today 19/30. Symptoms suggestive of mild cognitive impairment. Check TSH and B12. MRI brain with and without contrast will be ordered to assess for underlying structural abnormality. She will increase Zoloft to 100mg  daily. We  discussed advanced care planning, including assigning a power of attorney. We discussed the importance of control of vascular risk factors, physical exercise, and brain stimulation exercises for brain health. She will follow-up in 3 months and knows to call for any changes.   Thank you for allowing me to participate in the care of this patient. Please do not hesitate to call for any questions or concerns.   Ellouise Newer, M.D.  CC: Dr. Dorthy Cooler

## 2018-03-27 ENCOUNTER — Telehealth: Payer: Self-pay

## 2018-03-27 DIAGNOSIS — I5022 Chronic systolic (congestive) heart failure: Secondary | ICD-10-CM | POA: Diagnosis not present

## 2018-03-27 DIAGNOSIS — I10 Essential (primary) hypertension: Secondary | ICD-10-CM | POA: Diagnosis not present

## 2018-03-27 LAB — TSH: TSH: 1.33 mIU/L (ref 0.40–4.50)

## 2018-03-27 LAB — VITAMIN B12: Vitamin B-12: 1505 pg/mL — ABNORMAL HIGH (ref 200–1100)

## 2018-03-27 NOTE — Telephone Encounter (Signed)
LMOM asking for return call to relay message below.  

## 2018-03-27 NOTE — Telephone Encounter (Signed)
-----   Message from Cameron Sprang, MD sent at 03/27/2018  9:42 AM EDT ----- I meant, pls let HER know, thanks

## 2018-03-29 ENCOUNTER — Encounter: Payer: Self-pay | Admitting: Neurology

## 2018-03-29 DIAGNOSIS — R309 Painful micturition, unspecified: Secondary | ICD-10-CM | POA: Diagnosis not present

## 2018-04-02 ENCOUNTER — Telehealth: Payer: Self-pay | Admitting: Neurology

## 2018-04-02 DIAGNOSIS — I429 Cardiomyopathy, unspecified: Secondary | ICD-10-CM | POA: Diagnosis not present

## 2018-04-02 DIAGNOSIS — N3281 Overactive bladder: Secondary | ICD-10-CM | POA: Diagnosis not present

## 2018-04-02 DIAGNOSIS — N393 Stress incontinence (female) (male): Secondary | ICD-10-CM | POA: Diagnosis not present

## 2018-04-02 DIAGNOSIS — I5022 Chronic systolic (congestive) heart failure: Secondary | ICD-10-CM | POA: Diagnosis not present

## 2018-04-02 DIAGNOSIS — Z7982 Long term (current) use of aspirin: Secondary | ICD-10-CM | POA: Diagnosis not present

## 2018-04-02 DIAGNOSIS — Z95810 Presence of automatic (implantable) cardiac defibrillator: Secondary | ICD-10-CM | POA: Diagnosis not present

## 2018-04-02 DIAGNOSIS — E7800 Pure hypercholesterolemia, unspecified: Secondary | ICD-10-CM | POA: Diagnosis not present

## 2018-04-02 DIAGNOSIS — R69 Illness, unspecified: Secondary | ICD-10-CM | POA: Diagnosis not present

## 2018-04-02 DIAGNOSIS — I110 Hypertensive heart disease with heart failure: Secondary | ICD-10-CM | POA: Diagnosis not present

## 2018-04-02 NOTE — Telephone Encounter (Signed)
Spoke with pt's niece.  She states that pt was very confused about her LOV.  States that when niece asked questions about whry she was seen in Neurology, pt stated that it was due to her recent UTI and could not give any further information.  Niece advised of MRI referral as well as follow up appointment in October.  Niece intends to be present during next visit.

## 2018-04-02 NOTE — Telephone Encounter (Signed)
Patient's Niece called and would like to speak with you regarding her Aunts last visit with Dr. Delice Lesch. She said [name] is confused about maybe having a CT scheduled? Please Call. Thanks

## 2018-04-03 ENCOUNTER — Ambulatory Visit (INDEPENDENT_AMBULATORY_CARE_PROVIDER_SITE_OTHER): Payer: Medicare HMO | Admitting: *Deleted

## 2018-04-03 DIAGNOSIS — I5032 Chronic diastolic (congestive) heart failure: Secondary | ICD-10-CM

## 2018-04-03 DIAGNOSIS — I428 Other cardiomyopathies: Secondary | ICD-10-CM | POA: Diagnosis not present

## 2018-04-03 NOTE — Progress Notes (Signed)
Remote ICD transmission.   

## 2018-04-05 ENCOUNTER — Emergency Department (HOSPITAL_COMMUNITY): Payer: Medicare HMO

## 2018-04-05 ENCOUNTER — Emergency Department (HOSPITAL_COMMUNITY)
Admission: EM | Admit: 2018-04-05 | Discharge: 2018-04-05 | Disposition: A | Discharge status: Home / Self Care | Payer: Medicare HMO | Attending: Emergency Medicine | Admitting: Emergency Medicine

## 2018-04-05 ENCOUNTER — Encounter (HOSPITAL_COMMUNITY): Payer: Self-pay | Admitting: Emergency Medicine

## 2018-04-05 DIAGNOSIS — K5900 Constipation, unspecified: Secondary | ICD-10-CM | POA: Insufficient documentation

## 2018-04-05 DIAGNOSIS — I5022 Chronic systolic (congestive) heart failure: Secondary | ICD-10-CM | POA: Insufficient documentation

## 2018-04-05 DIAGNOSIS — Z79899 Other long term (current) drug therapy: Secondary | ICD-10-CM | POA: Insufficient documentation

## 2018-04-05 DIAGNOSIS — I2510 Atherosclerotic heart disease of native coronary artery without angina pectoris: Secondary | ICD-10-CM | POA: Diagnosis not present

## 2018-04-05 DIAGNOSIS — I110 Hypertensive heart disease with heart failure: Secondary | ICD-10-CM | POA: Insufficient documentation

## 2018-04-05 DIAGNOSIS — R109 Unspecified abdominal pain: Secondary | ICD-10-CM | POA: Diagnosis not present

## 2018-04-05 DIAGNOSIS — N390 Urinary tract infection, site not specified: Secondary | ICD-10-CM | POA: Insufficient documentation

## 2018-04-05 DIAGNOSIS — Z87891 Personal history of nicotine dependence: Secondary | ICD-10-CM | POA: Insufficient documentation

## 2018-04-05 DIAGNOSIS — Z7982 Long term (current) use of aspirin: Secondary | ICD-10-CM | POA: Diagnosis not present

## 2018-04-05 DIAGNOSIS — R1032 Left lower quadrant pain: Secondary | ICD-10-CM | POA: Diagnosis not present

## 2018-04-05 LAB — URINALYSIS, ROUTINE W REFLEX MICROSCOPIC
Bilirubin Urine: NEGATIVE
Glucose, UA: NEGATIVE mg/dL
Hgb urine dipstick: NEGATIVE
Ketones, ur: NEGATIVE mg/dL
Nitrite: POSITIVE — AB
Protein, ur: 100 mg/dL — AB
Specific Gravity, Urine: 1.03 (ref 1.005–1.030)
pH: 5 (ref 5.0–8.0)

## 2018-04-05 LAB — COMPREHENSIVE METABOLIC PANEL
ALT: 32 U/L (ref 0–44)
AST: 32 U/L (ref 15–41)
Albumin: 4.1 g/dL (ref 3.5–5.0)
Alkaline Phosphatase: 85 U/L (ref 38–126)
Anion gap: 8 (ref 5–15)
BUN: 26 mg/dL — ABNORMAL HIGH (ref 8–23)
CO2: 23 mmol/L (ref 22–32)
Calcium: 10.7 mg/dL — ABNORMAL HIGH (ref 8.9–10.3)
Chloride: 111 mmol/L (ref 98–111)
Creatinine, Ser: 0.9 mg/dL (ref 0.44–1.00)
GFR calc Af Amer: >60 mL/min (ref >60)
GFR calc non Af Amer: >60 mL/min (ref >60)
Glucose, Bld: 100 mg/dL — ABNORMAL HIGH (ref 70–99)
Potassium: 3.9 mmol/L (ref 3.5–5.1)
Sodium: 142 mmol/L (ref 135–145)
Total Bilirubin: 0.4 mg/dL (ref 0.3–1.2)
Total Protein: 7.5 g/dL (ref 6.5–8.1)

## 2018-04-05 LAB — CBC
HCT: 45.8 % (ref 36.0–46.0)
Hemoglobin: 15 g/dL (ref 12.0–15.0)
MCH: 29.9 pg (ref 26.0–34.0)
MCHC: 32.8 g/dL (ref 30.0–36.0)
MCV: 91.2 fL (ref 78.0–100.0)
Platelets: 148 10*3/uL — ABNORMAL LOW (ref 150–400)
RBC: 5.02 MIL/uL (ref 3.87–5.11)
RDW: 17.3 % — ABNORMAL HIGH (ref 11.5–15.5)
WBC: 4.8 10*3/uL (ref 4.0–10.5)

## 2018-04-05 LAB — POC OCCULT BLOOD, ED: Fecal Occult Bld: NEGATIVE

## 2018-04-05 LAB — LIPASE, BLOOD: Lipase: 58 U/L — ABNORMAL HIGH (ref 11–51)

## 2018-04-05 MED ORDER — SODIUM CHLORIDE 0.9 % IV SOLN
1.0000 g | Freq: Once | INTRAVENOUS | Status: AC
  Administered 2018-04-05: 1 g via INTRAVENOUS
  Filled 2018-04-05: qty 10

## 2018-04-05 MED ORDER — POLYETHYLENE GLYCOL 3350 17 G PO PACK: 17.0000 g | PACK | Freq: Every day | ORAL | 0 refills | Status: DC

## 2018-04-05 MED ORDER — IOPAMIDOL (ISOVUE-300) INJECTION 61%
100.0000 mL | Freq: Once | INTRAVENOUS | Status: AC | PRN
  Administered 2018-04-05: 100 mL via INTRAVENOUS

## 2018-04-05 MED ORDER — CEPHALEXIN 500 MG PO CAPS: 500.0000 mg | ORAL_CAPSULE | Freq: Three times a day (TID) | ORAL | 0 refills | Status: DC

## 2018-04-05 MED ORDER — IOPAMIDOL (ISOVUE-300) INJECTION 61%
INTRAVENOUS | Status: AC
  Filled 2018-04-05: qty 100

## 2018-04-05 NOTE — ED Provider Notes (Signed)
[facility] DEPT Provider Note   CSN: [id number] Arrival date & time: 04/05/18  1646     History   Chief Complaint Chief Complaint  Patient presents with  . Abdominal Pain  . Constipation    HPI [name] is a 63 y.o. female.  The history is provided by the patient. No language interpreter was used.  Abdominal Pain   Associated symptoms include constipation.  Constipation   Associated symptoms include abdominal pain.     62 year old female presenting for evaluation of abdominal pain.  Patient report for the past 2 weeks she has had recurrent left lower quadrant abdominal pain.  She describes the pain as " soreness like a rash" 8 out of 10, worsening with standing up.  She also endorsed having constipation for the same duration not having a bowel movement or having the urge to have a bowel movement for at least a week.  She is able to pass flatus.  No report of fever, chills, nausea, vomiting, chest pain, shortness of breath, dysuria, hematuria, vaginal bleeding.  She does report decrease in appetite.  She believes her symptoms started after she was prescribed a medication for her overactive bladder a month ago, trospium chloride.  Past Medical History:  Diagnosis Date  . Abnormal liver function tests   . AICD (automatic cardioverter/defibrillator) present    [employer]  . Anxiety   . Aortic stenosis   . Benign neoplasm of colon 04/19/2001   Hyperplastic  . Bicuspid aortic valve   . Cardiomyopathy secondary   . CHF (congestive heart failure) ([place])   . Coronary artery disease   . Depression   . Headache   . Heart murmur   . HTN (hypertension)   . Hypercalcemia   . Hyperlipidemia   . S/P ICD (internal cardiac defibrillator) procedure     Patient Active Problem List   Diagnosis Date Noted  . S/P AVR (aortic valve replacement) 12/12/2016  . Aortic stenosis 11/16/2016  . Severe aortic stenosis   . Polycythemia  06/06/2016  . Menopausal state 02/16/2016  . Hyperparathyroidism ([place]) 02/16/2016  . Chronic systolic heart failure ([place]) 05/07/2014  . Hx of colonic polyps 06/17/2013  . Ventricular tachycardia ([place]) 02/21/2011  . Automatic implantable cardioverter-defibrillator in situ 06/02/2009  . HYPERLIPIDEMIA 05/29/2009  . SMOKER 05/29/2009  . Essential hypertension 05/29/2009  . Aortic valve disorder 05/29/2009  . CARDIOMYOPATHY, SECONDARY 05/29/2009  . CHF 05/29/2009  . BICUSPID AORTIC VALVE 05/29/2009  . LIVER FUNCTION TESTS, ABNORMAL, HX OF 05/29/2009  . CHOLECYSTITIS, HX OF 05/29/2009    Past Surgical History:  Procedure Laterality Date  . AORTIC VALVE REPLACEMENT N/A 11/16/2016   Procedure: AORTIC VALVE REPLACEMENT (AVR);  Surgeon: Gaye Pollack, MD;  Location: [facility];  Service: Open Heart Surgery;  Laterality: N/A;  23mm Perimount Magna Ease Pericardial Bioprosthesis  . BREAST EXCISIONAL BIOPSY Left 1984  . CARDIAC CATHETERIZATION N/A 07/05/2016   Procedure: Right/Left Heart Cath and Coronary Angiography;  Surgeon: Burnell Blanks, MD;  Location: [facility];  Service: Cardiovascular;  Laterality: N/A;  . CHOLECYSTECTOMY    . implantation of a Guidant biventricular ICD    . laparoscopic cholecystectomy with intraoperative cholangiogram    . TEE WITHOUT CARDIOVERSION N/A 11/16/2016   Procedure: TRANSESOPHAGEAL ECHOCARDIOGRAM (TEE);  Surgeon: Gaye Pollack, MD;  Location: [facility];  Service: Open Heart Surgery;  Laterality: N/A;     OB History   None      Home  Medications    Prior to Admission medications   Medication Sig Start Date End Date Taking? Authorizing Provider  acetaminophen (TYLENOL) 325 MG tablet Take 325 mg by mouth every 6 (six) hours as needed for mild pain.    [provider]  aspirin EC 81 MG tablet Take 1 tablet (81 mg total) daily by mouth. 08/10/17   Burnell Blanks, MD  carvedilol (COREG) 25 MG tablet Take 25 mg by mouth 2 (two)  times daily with a meal.     [provider]  furosemide (LASIX) 40 MG tablet Take 1 tablet by mouth daily for 5 days then go down to 1 tablet by mouth every other day 07/05/17   Isaiah Serge, NP  Multiple Vitamin (MULTIVITAMIN WITH MINERALS) TABS tablet Take 1 tablet by mouth at bedtime.    [provider]  olmesartan (BENICAR) 40 MG tablet Take 0.5 tablets (20 mg total) by mouth daily. 08/28/17   Burnell Blanks, MD  Polyethyl Glycol-Propyl Glycol (SYSTANE OP) Place 1-2 drops into both eyes 3 (three) times daily as needed (for dry eyes.).    [provider]  rosuvastatin (CRESTOR) 10 MG tablet Take 1 tablet (10 mg total) by mouth daily. 05/01/11   Evans Lance, MD  sertraline (ZOLOFT) 50 MG tablet Take 50 mg by mouth daily.    [provider]  Trospium Chloride 60 MG CP24 Take 60 mg by mouth daily.    [provider]    Family History Family History  Problem Relation Age of Onset  . Pancreatic cancer Mother   . Colon cancer Maternal Aunt   . Bone cancer Brother   . Heart attack Brother   . Hypercalcemia Neg Hx     Social History Social History   Tobacco Use  . Smoking status: Former Smoker    Packs/day: 0.25    Years: 40.00    Pack years: 10.00    Types: Cigarettes    Last attempt to quit: 09/25/2010    Years since quitting: 7.5  . Smokeless tobacco: Never Used  . Tobacco comment: patient says she smokes 4 cigarettes a day  Substance Use Topics  . Alcohol use: Yes    Alcohol/week: 0.0 oz    Comment: occasional  . Drug use: No     Allergies   No known allergies   Review of Systems Review of Systems  Gastrointestinal: Positive for abdominal pain and constipation.  All other systems reviewed and are negative.    Physical Exam Updated Vital Signs BP (!) 184/118 (BP Location: Left Arm) Comment: Pt states she took her BP meds and usually takes them again around 9 or 10PM  Pulse 82   Temp 98.9 F (37.2 C) (Oral)    Resp 16   SpO2 100%   Physical Exam  Constitutional: She appears well-developed and well-nourished. No distress.  HENT:  Head: Atraumatic.  Eyes: Conjunctivae are normal.  Neck: Neck supple.  Cardiovascular: Normal rate and regular rhythm.  Pulmonary/Chest: Effort normal and breath sounds normal.  Abdominal: Soft. Normal appearance and bowel sounds are normal. There is no tenderness.  Genitourinary:  Genitourinary Comments: Chaperone present during exam.  Normal rectal tone, no thrombosed hemorrhoid, no obvious mass, no stool impaction, normal color stool on glove.  Neurological: She is alert.  Skin: No rash noted.  Psychiatric: She has a normal mood and affect.  Nursing note and vitals reviewed.    ED Treatments / Results  Labs (all labs ordered  are listed, but only abnormal results are displayed) Labs Reviewed  LIPASE, BLOOD - Abnormal; Notable for the following components:      Result Value   Lipase 58 (*)    All other components within normal limits  COMPREHENSIVE METABOLIC PANEL - Abnormal; Notable for the following components:   Glucose, Bld 100 (*)    BUN 26 (*)    Calcium 10.7 (*)    All other components within normal limits  CBC - Abnormal; Notable for the following components:   RDW 17.3 (*)    Platelets 148 (*)    All other components within normal limits  URINALYSIS, ROUTINE W REFLEX MICROSCOPIC - Abnormal; Notable for the following components:   Color, Urine AMBER (*)    Protein, ur 100 (*)    Nitrite POSITIVE (*)    Leukocytes, UA SMALL (*)    Bacteria, UA RARE (*)    All other components within normal limits  URINE CULTURE  POC OCCULT BLOOD, ED    EKG None  Radiology Ct Abdomen Pelvis W Contrast  Result Date: 04/05/2018 CLINICAL DATA:  Left lower quadrant abdominal pain and extreme constipation. History of appendectomy and cholecystectomy. EXAM: CT ABDOMEN AND PELVIS WITH CONTRAST TECHNIQUE: Multidetector CT imaging of the abdomen and pelvis  was performed using the standard protocol following bolus administration of intravenous contrast. CONTRAST:  168mL ISOVUE-300 IOPAMIDOL (ISOVUE-300) INJECTION 61% COMPARISON:  Limited correlation made with lumbar spine CT 11/02/2005. FINDINGS: Lower chest: Mild linear atelectasis or scarring at both lung bases. Patient has a pacemaker. No significant pleural or pericardial effusion. Hepatobiliary: The liver is normal in density without focal abnormality. No significant biliary dilatation status post cholecystectomy. Pancreas: Unremarkable. No pancreatic ductal dilatation or surrounding inflammatory changes. Spleen: Normal in size without focal abnormality. Adrenals/Urinary Tract: Both adrenal glands appear normal. There are tiny bilateral renal cysts. No evidence of renal mass, urinary tract calculus or hydronephrosis. The bladder appears unremarkable. Stomach/Bowel: No evidence of bowel wall thickening, distention or surrounding inflammatory change. Previous appendectomy. Moderate stool throughout the colon. Vascular/Lymphatic: There are no enlarged abdominal or pelvic lymph nodes. Extensive aortic and branch vessel atherosclerosis. No evidence of large vessel occlusion. Reproductive: Hysterectomy.  No adnexal mass. Other: No evidence of abdominal wall mass or hernia. No ascites. Musculoskeletal: No acute or significant osseous findings. IMPRESSION: 1. No acute findings or explanation for the patient's symptoms. 2. Age advanced Aortic Atherosclerosis (ICD10-I70.0). 3. Postsurgical changes as described. Electronically Signed   By: Richardean Sale M.D.   On: 04/05/2018 22:18    Procedures Procedures (including critical care time)  Medications Ordered in ED Medications  iopamidol (ISOVUE-300) 61 % injection (has no administration in time range)  cefTRIAXone (ROCEPHIN) 1 g in sodium chloride 0.9 % 100 mL IVPB (1 g Intravenous New Bag/Given 04/05/18 2043)  iopamidol (ISOVUE-300) 61 % injection 100 mL (100 mLs  Intravenous Contrast Given 04/05/18 2141)     Initial Impression / Assessment and Plan / ED Course  I have reviewed the triage vital signs and the nursing notes.  Pertinent labs & imaging results that were available during my care of the patient were reviewed by me and considered in my medical decision making (see chart for details).     BP (!) 184/118 (BP Location: Left Arm) Comment: Pt states she took her BP meds and usually takes them again around 9 or 10PM  Pulse 82   Temp 98.9 F (37.2 C) (Oral)   Resp 16   SpO2 100%  The patient was noted to be hypertensive today in the emergency department. I have spoken with the patient regarding hypertension and the need for improved management. I instructed the patient to followup with the Primary care doctor within 4 days to improve the management of the patient's hypertension. I also counseled the patient regarding the signs and symptoms which would require an emergent visit to an emergency department for hypertensive urgency and/or hypertensive emergency. The patient understood the need for improved hypertensive management.   Final Clinical Impressions(s) / ED Diagnoses   Final diagnoses:  Constipation, unspecified constipation type  Lower urinary tract infectious disease    ED Discharge Orders        Ordered    cephALEXin (KEFLEX) 500 MG capsule  3 times daily     04/05/18 2231    polyethylene glycol (MIRALAX / GLYCOLAX) packet  Daily     04/05/18 2231     8:21 PM Patient endorsed constipation for the past 1 to 2 weeks as well as having left lower quadrant abdominal pain.  Normal rectal exam.  Given age, will obtain abdominal and pelvic CT scan for further evaluation even though she has minimal pain on exam.  Urine concerning for potential UTI however she denies any urinary symptoms.  We will give Rocephin, urine culture sent.  10:27 PM Abdominal pelvis CT scan demonstrate no acute finding or explanation for patient's symptoms.   Evidence of moderate stool burden were noted throughout colon.  Plan to discharge patient home with MiraLAX for constipation, and Keflex for UTI.  Encourage patient to follow-up with PCP for further care.  Return precautions discussed.   Domenic Moras, PA-C 04/05/18 2231    Daleen Bo, MD 04/05/18 [phone]

## 2018-04-05 NOTE — ED Triage Notes (Signed)
Patient c/o LLQ abdominal pain and constipation. Reports last BM was "over a week ago." Denies N/V.

## 2018-04-07 LAB — URINE CULTURE: Culture: <10000 — AB

## 2018-04-09 DIAGNOSIS — I110 Hypertensive heart disease with heart failure: Secondary | ICD-10-CM | POA: Diagnosis not present

## 2018-04-09 DIAGNOSIS — N3281 Overactive bladder: Secondary | ICD-10-CM | POA: Diagnosis not present

## 2018-04-09 DIAGNOSIS — I5022 Chronic systolic (congestive) heart failure: Secondary | ICD-10-CM | POA: Diagnosis not present

## 2018-04-09 DIAGNOSIS — N393 Stress incontinence (female) (male): Secondary | ICD-10-CM | POA: Diagnosis not present

## 2018-04-09 DIAGNOSIS — E7800 Pure hypercholesterolemia, unspecified: Secondary | ICD-10-CM | POA: Diagnosis not present

## 2018-04-09 DIAGNOSIS — I429 Cardiomyopathy, unspecified: Secondary | ICD-10-CM | POA: Diagnosis not present

## 2018-04-09 DIAGNOSIS — Z7982 Long term (current) use of aspirin: Secondary | ICD-10-CM | POA: Diagnosis not present

## 2018-04-09 DIAGNOSIS — R69 Illness, unspecified: Secondary | ICD-10-CM | POA: Diagnosis not present

## 2018-04-09 DIAGNOSIS — Z95810 Presence of automatic (implantable) cardiac defibrillator: Secondary | ICD-10-CM | POA: Diagnosis not present

## 2018-04-12 DIAGNOSIS — Z95810 Presence of automatic (implantable) cardiac defibrillator: Secondary | ICD-10-CM | POA: Diagnosis not present

## 2018-04-12 DIAGNOSIS — I110 Hypertensive heart disease with heart failure: Secondary | ICD-10-CM | POA: Diagnosis not present

## 2018-04-12 DIAGNOSIS — I5022 Chronic systolic (congestive) heart failure: Secondary | ICD-10-CM | POA: Diagnosis not present

## 2018-04-12 DIAGNOSIS — R69 Illness, unspecified: Secondary | ICD-10-CM | POA: Diagnosis not present

## 2018-04-12 DIAGNOSIS — N393 Stress incontinence (female) (male): Secondary | ICD-10-CM | POA: Diagnosis not present

## 2018-04-12 DIAGNOSIS — I429 Cardiomyopathy, unspecified: Secondary | ICD-10-CM | POA: Diagnosis not present

## 2018-04-12 DIAGNOSIS — Z7982 Long term (current) use of aspirin: Secondary | ICD-10-CM | POA: Diagnosis not present

## 2018-04-12 DIAGNOSIS — N3281 Overactive bladder: Secondary | ICD-10-CM | POA: Diagnosis not present

## 2018-04-12 DIAGNOSIS — E7800 Pure hypercholesterolemia, unspecified: Secondary | ICD-10-CM | POA: Diagnosis not present

## 2018-04-12 DIAGNOSIS — R3915 Urgency of urination: Secondary | ICD-10-CM | POA: Diagnosis not present

## 2018-04-19 DIAGNOSIS — E7800 Pure hypercholesterolemia, unspecified: Secondary | ICD-10-CM | POA: Diagnosis not present

## 2018-04-19 DIAGNOSIS — Z7982 Long term (current) use of aspirin: Secondary | ICD-10-CM | POA: Diagnosis not present

## 2018-04-19 DIAGNOSIS — N393 Stress incontinence (female) (male): Secondary | ICD-10-CM | POA: Diagnosis not present

## 2018-04-19 DIAGNOSIS — Z95810 Presence of automatic (implantable) cardiac defibrillator: Secondary | ICD-10-CM | POA: Diagnosis not present

## 2018-04-19 DIAGNOSIS — I110 Hypertensive heart disease with heart failure: Secondary | ICD-10-CM | POA: Diagnosis not present

## 2018-04-19 DIAGNOSIS — N3281 Overactive bladder: Secondary | ICD-10-CM | POA: Diagnosis not present

## 2018-04-19 DIAGNOSIS — R69 Illness, unspecified: Secondary | ICD-10-CM | POA: Diagnosis not present

## 2018-04-19 DIAGNOSIS — I429 Cardiomyopathy, unspecified: Secondary | ICD-10-CM | POA: Diagnosis not present

## 2018-04-19 DIAGNOSIS — I5022 Chronic systolic (congestive) heart failure: Secondary | ICD-10-CM | POA: Diagnosis not present

## 2018-04-24 DIAGNOSIS — I5022 Chronic systolic (congestive) heart failure: Secondary | ICD-10-CM | POA: Diagnosis not present

## 2018-04-24 DIAGNOSIS — Z79899 Other long term (current) drug therapy: Secondary | ICD-10-CM | POA: Diagnosis not present

## 2018-04-24 DIAGNOSIS — I10 Essential (primary) hypertension: Secondary | ICD-10-CM | POA: Diagnosis not present

## 2018-04-24 DIAGNOSIS — D751 Secondary polycythemia: Secondary | ICD-10-CM | POA: Diagnosis not present

## 2018-04-24 DIAGNOSIS — E213 Hyperparathyroidism, unspecified: Secondary | ICD-10-CM | POA: Diagnosis not present

## 2018-04-25 DIAGNOSIS — N393 Stress incontinence (female) (male): Secondary | ICD-10-CM | POA: Diagnosis not present

## 2018-04-25 DIAGNOSIS — I110 Hypertensive heart disease with heart failure: Secondary | ICD-10-CM | POA: Diagnosis not present

## 2018-04-25 DIAGNOSIS — I5022 Chronic systolic (congestive) heart failure: Secondary | ICD-10-CM | POA: Diagnosis not present

## 2018-04-25 DIAGNOSIS — E7800 Pure hypercholesterolemia, unspecified: Secondary | ICD-10-CM | POA: Diagnosis not present

## 2018-04-25 DIAGNOSIS — R69 Illness, unspecified: Secondary | ICD-10-CM | POA: Diagnosis not present

## 2018-04-25 DIAGNOSIS — I429 Cardiomyopathy, unspecified: Secondary | ICD-10-CM | POA: Diagnosis not present

## 2018-04-25 DIAGNOSIS — N3281 Overactive bladder: Secondary | ICD-10-CM | POA: Diagnosis not present

## 2018-04-25 DIAGNOSIS — Z7982 Long term (current) use of aspirin: Secondary | ICD-10-CM | POA: Diagnosis not present

## 2018-04-25 DIAGNOSIS — Z95810 Presence of automatic (implantable) cardiac defibrillator: Secondary | ICD-10-CM | POA: Diagnosis not present

## 2018-05-07 DIAGNOSIS — Z7982 Long term (current) use of aspirin: Secondary | ICD-10-CM | POA: Diagnosis not present

## 2018-05-07 DIAGNOSIS — E7800 Pure hypercholesterolemia, unspecified: Secondary | ICD-10-CM | POA: Diagnosis not present

## 2018-05-07 DIAGNOSIS — N393 Stress incontinence (female) (male): Secondary | ICD-10-CM | POA: Diagnosis not present

## 2018-05-07 DIAGNOSIS — I110 Hypertensive heart disease with heart failure: Secondary | ICD-10-CM | POA: Diagnosis not present

## 2018-05-07 DIAGNOSIS — I5022 Chronic systolic (congestive) heart failure: Secondary | ICD-10-CM | POA: Diagnosis not present

## 2018-05-07 DIAGNOSIS — N3281 Overactive bladder: Secondary | ICD-10-CM | POA: Diagnosis not present

## 2018-05-07 DIAGNOSIS — R69 Illness, unspecified: Secondary | ICD-10-CM | POA: Diagnosis not present

## 2018-05-07 DIAGNOSIS — I429 Cardiomyopathy, unspecified: Secondary | ICD-10-CM | POA: Diagnosis not present

## 2018-05-07 DIAGNOSIS — Z95810 Presence of automatic (implantable) cardiac defibrillator: Secondary | ICD-10-CM | POA: Diagnosis not present

## 2018-05-08 DIAGNOSIS — R69 Illness, unspecified: Secondary | ICD-10-CM | POA: Diagnosis not present

## 2018-05-08 DIAGNOSIS — N393 Stress incontinence (female) (male): Secondary | ICD-10-CM | POA: Diagnosis not present

## 2018-05-08 DIAGNOSIS — N3281 Overactive bladder: Secondary | ICD-10-CM | POA: Diagnosis not present

## 2018-05-08 DIAGNOSIS — E7800 Pure hypercholesterolemia, unspecified: Secondary | ICD-10-CM | POA: Diagnosis not present

## 2018-05-08 DIAGNOSIS — Z7982 Long term (current) use of aspirin: Secondary | ICD-10-CM | POA: Diagnosis not present

## 2018-05-08 DIAGNOSIS — Z79899 Other long term (current) drug therapy: Secondary | ICD-10-CM | POA: Diagnosis not present

## 2018-05-08 DIAGNOSIS — Z95810 Presence of automatic (implantable) cardiac defibrillator: Secondary | ICD-10-CM | POA: Diagnosis not present

## 2018-05-08 DIAGNOSIS — I110 Hypertensive heart disease with heart failure: Secondary | ICD-10-CM | POA: Diagnosis not present

## 2018-05-08 DIAGNOSIS — I429 Cardiomyopathy, unspecified: Secondary | ICD-10-CM | POA: Diagnosis not present

## 2018-05-08 DIAGNOSIS — I5022 Chronic systolic (congestive) heart failure: Secondary | ICD-10-CM | POA: Diagnosis not present

## 2018-05-08 LAB — CUP PACEART REMOTE DEVICE CHECK
Battery Remaining Longevity: 42 mo
Battery Remaining Percentage: 70 %
Brady Statistic RA Percent Paced: 0 %
Brady Statistic RV Percent Paced: 100 %
Date Time Interrogation Session: 20190710041000
HighPow Impedance: 60 Ohm
Implantable Lead Implant Date: 20060630
Implantable Lead Implant Date: 20060630
Implantable Lead Implant Date: 20060630
Implantable Lead Location: 753858
Implantable Lead Location: 753859
Implantable Lead Location: 753860
Implantable Lead Model: 157
Implantable Lead Model: 4086
Implantable Lead Model: 4543
Implantable Lead Serial Number: 119717
Implantable Lead Serial Number: 131677
Implantable Lead Serial Number: 223436
Implantable Pulse Generator Implant Date: 20120131
Lead Channel Impedance Value: 1432 Ohm
Lead Channel Impedance Value: 441 Ohm
Lead Channel Impedance Value: 558 Ohm
Lead Channel Pacing Threshold Amplitude: 1 V
Lead Channel Pacing Threshold Amplitude: 1.2 V
Lead Channel Pacing Threshold Amplitude: 1.2 V
Lead Channel Pacing Threshold Pulse Width: 0.4 ms
Lead Channel Pacing Threshold Pulse Width: 0.4 ms
Lead Channel Pacing Threshold Pulse Width: 0.4 ms
Lead Channel Setting Pacing Amplitude: 2 V
Lead Channel Setting Pacing Amplitude: 2.2 V
Lead Channel Setting Pacing Amplitude: 2.4 V
Lead Channel Setting Pacing Pulse Width: 0.4 ms
Lead Channel Setting Pacing Pulse Width: 0.4 ms
Lead Channel Setting Sensing Sensitivity: 0.5 mV
Lead Channel Setting Sensing Sensitivity: 1 mV
Pulse Gen Serial Number: 498164

## 2018-05-14 ENCOUNTER — Encounter: Payer: Self-pay | Admitting: Gastroenterology

## 2018-05-14 ENCOUNTER — Ambulatory Visit: Payer: Medicare HMO | Admitting: Gastroenterology

## 2018-05-14 VITALS — BP 114/72 | HR 64 | Ht 64.0 in | Wt 132.2 lb

## 2018-05-14 DIAGNOSIS — K5901 Slow transit constipation: Secondary | ICD-10-CM | POA: Diagnosis not present

## 2018-05-14 DIAGNOSIS — Z86010 Personal history of colonic polyps: Secondary | ICD-10-CM | POA: Diagnosis not present

## 2018-05-14 MED ORDER — PEG-KCL-NACL-NASULF-NA ASC-C 140 G PO SOLR: 1.0000 | Freq: Once | ORAL | 0 refills | Status: AC

## 2018-05-14 NOTE — Progress Notes (Signed)
History of Present Illness: This is a 63 year old female with a remote history of adenomatous colon polyps (1999) and chronic constipation.  She relates that taking MiraLAX daily has helped her constipation and her bowels generally move every 2 to 3 days without straining.  She has no other gastrointestinal complaints.  Denies weight loss, abdominal pain, constipation, diarrhea, change in stool caliber, melena, hematochezia, nausea, vomiting, dysphagia, reflux symptoms, chest pain.  Allergies  Allergen Reactions  . Trospium Chloride Er Other (See Comments)    Constipation    Outpatient Medications Prior to Visit  Medication Sig Dispense Refill  . acetaminophen (TYLENOL) 325 MG tablet Take 325 mg by mouth every 6 (six) hours as needed for mild pain.    Marland Kitchen aspirin EC 81 MG tablet Take 1 tablet (81 mg total) daily by mouth. 90 tablet 3  . carvedilol (COREG) 25 MG tablet Take 25 mg by mouth 2 (two) times daily with a meal.     . furosemide (LASIX) 20 MG tablet Take 20 mg by mouth daily.    . Multiple Vitamin (MULTIVITAMIN WITH MINERALS) TABS tablet Take 1 tablet by mouth at bedtime.    Marland Kitchen olmesartan (BENICAR) 40 MG tablet Take 0.5 tablets (20 mg total) by mouth daily. 45 tablet 3  . Polyethyl Glycol-Propyl Glycol (SYSTANE OP) Place 1-2 drops into both eyes 3 (three) times daily as needed (for dry eyes.).    Marland Kitchen polyethylene glycol (MIRALAX / GLYCOLAX) packet Take 17 g by mouth daily. 14 each 0  . rosuvastatin (CRESTOR) 10 MG tablet Take 1 tablet (10 mg total) by mouth daily. 90 tablet 3  . sertraline (ZOLOFT) 100 MG tablet Take 100 mg by mouth daily.     . cephALEXin (KEFLEX) 500 MG capsule Take 1 capsule (500 mg total) by mouth 3 (three) times daily. 21 capsule 0  . Trospium Chloride 60 MG CP24 Take 60 mg by mouth daily.     No facility-administered medications prior to visit.    Past Medical History:  Diagnosis Date  . Abnormal liver function tests   . AICD (automatic  cardioverter/defibrillator) present    [employer]  . Anxiety   . Aortic stenosis   . Benign neoplasm of colon 04/19/2001   Hyperplastic  . Bicuspid aortic valve   . Cardiomyopathy secondary   . CHF (congestive heart failure) ([place])   . Coronary artery disease   . Depression   . Headache   . Heart murmur   . HTN (hypertension)   . Hypercalcemia   . Hyperlipidemia   . S/P ICD (internal cardiac defibrillator) procedure    Past Surgical History:  Procedure Laterality Date  . AORTIC VALVE REPLACEMENT N/A 11/16/2016   Procedure: AORTIC VALVE REPLACEMENT (AVR);  Surgeon: Gaye Pollack, MD;  Location: [facility];  Service: Open Heart Surgery;  Laterality: N/A;  74mm Perimount Magna Ease Pericardial Bioprosthesis  . BREAST EXCISIONAL BIOPSY Left 1984  . CARDIAC CATHETERIZATION N/A 07/05/2016   Procedure: Right/Left Heart Cath and Coronary Angiography;  Surgeon: Burnell Blanks, MD;  Location: [facility];  Service: Cardiovascular;  Laterality: N/A;  . CHOLECYSTECTOMY    . implantation of a Guidant biventricular ICD    . laparoscopic cholecystectomy with intraoperative cholangiogram    . TEE WITHOUT CARDIOVERSION N/A 11/16/2016   Procedure: TRANSESOPHAGEAL ECHOCARDIOGRAM (TEE);  Surgeon: Gaye Pollack, MD;  Location: [facility];  Service: Open Heart Surgery;  Laterality: N/A;   Social History   Socioeconomic History  .  Marital status: Single    Spouse name: Not on file  . Number of children: 1  . Years of education: Not on file  . Highest education level: Not on file  Occupational History  . Occupation: Geophysicist/field seismologist: UNEMPLOYED  Social Needs  . Financial resource strain: Not on file  . Food insecurity:    Worry: Not on file    Inability: Not on file  . Transportation needs:    Medical: Not on file    Non-medical: Not on file  Tobacco Use  . Smoking status: Former Smoker    Packs/day: 0.25    Years: 40.00    Pack years: 10.00    Types:  Cigarettes    Last attempt to quit: 09/25/2010    Years since quitting: 7.6  . Smokeless tobacco: Never Used  . Tobacco comment: patient says she smokes 4 cigarettes a day  Substance and Sexual Activity  . Alcohol use: Yes    Alcohol/week: 0.0 standard drinks    Comment: occasional  . Drug use: No  . Sexual activity: Not on file  Lifestyle  . Physical activity:    Days per week: Not on file    Minutes per session: Not on file  . Stress: Not on file  Relationships  . Social connections:    Talks on phone: Not on file    Gets together: Not on file    Attends religious service: Not on file    Active member of club or organization: Not on file    Attends meetings of clubs or organizations: Not on file    Relationship status: Not on file  Other Topics Concern  . Not on file  Social History Narrative  . Not on file   Family History  Problem Relation Age of Onset  . Pancreatic cancer Mother   . Colon cancer Maternal Aunt   . Bone cancer Brother   . Heart attack Brother   . Hypercalcemia Neg Hx       Review of Systems: Pertinent positive and negative review of systems were noted in the above HPI section. All other review of systems were otherwise negative.    Physical Exam: General: Well developed, well nourished, no acute distress Head: Normocephalic and atraumatic Eyes:  sclerae anicteric, EOMI Ears: Normal auditory acuity Mouth: No deformity or lesions Neck: Supple, no masses or thyromegaly Lungs: Clear throughout to auscultation Heart: Regular rate and rhythm; no murmurs, rubs or bruits Abdomen: Soft, non tender and non distended. No masses, hepatosplenomegaly or hernias noted. Normal Bowel sounds Rectal: Deferred to colonoscopy Musculoskeletal: Symmetrical with no gross deformities  Skin: No lesions on visible extremities Pulses:  Normal pulses noted Extremities: No clubbing, cyanosis, edema or deformities noted Neurological: Alert oriented x 4, grossly  nonfocal Cervical Nodes:  No significant cervical adenopathy Inguinal Nodes: No significant inguinal adenopathy Psychological:  Alert and cooperative. Normal mood and affect  Assessment and Recommendations:  1.  Remote history of adenomatous colon polyps, 1999.  It has been 10 years since her last colonoscopy.  Schedule colonoscopy. The risks (including bleeding, perforation, infection, missed lesions, medication reactions and possible hospitalization or surgery if complications occur), benefits, and alternatives to colonoscopy with possible biopsy and possible polypectomy were discussed with the patient and they consent to proceed.   2.  Slow transit constipation.  Continue MiraLAX qd to bid.

## 2018-05-14 NOTE — Patient Instructions (Addendum)
5 days leading up to your Colonoscopy, please increase your Miralax to twice daily.   You have been scheduled for a colonoscopy. Please follow written instructions given to you at your visit today.  Please pick up your prep supplies at the pharmacy within the next 1-3 days. If you use inhalers (even only as needed), please bring them with you on the day of your procedure. Your physician has requested that you go to www.startemmi.com and enter the access code given to you at your visit today. This web site gives a general overview about your procedure. However, you should still follow specific instructions given to you by our office regarding your preparation for the procedure.  Normal BMI (Body Mass Index- based on height and weight) is between 19 and 25. Your BMI today is Body mass index is 22.7 kg/m. Marland Kitchen Please consider follow up  regarding your BMI with your Primary Care Provider.  Thank you for choosing me and [facility].  Pricilla Riffle. Dagoberto Ligas., MD., [facility] Regal

## 2018-05-15 ENCOUNTER — Encounter: Payer: Self-pay | Admitting: Endocrinology

## 2018-05-15 ENCOUNTER — Ambulatory Visit (INDEPENDENT_AMBULATORY_CARE_PROVIDER_SITE_OTHER): Payer: Medicare HMO | Admitting: Endocrinology

## 2018-05-15 VITALS — BP 136/100 | HR 68 | Ht 64.0 in | Wt 131.1 lb

## 2018-05-15 DIAGNOSIS — E210 Primary hyperparathyroidism: Secondary | ICD-10-CM

## 2018-05-15 LAB — VITAMIN D 25 HYDROXY (VIT D DEFICIENCY, FRACTURES): VITD: 33.49 ng/mL (ref 30.00–100.00)

## 2018-05-15 NOTE — Progress Notes (Signed)
Subjective:    Patient ID: [name], female    DOB: 12-15-1955, 62 y.o.   MRN: [id number]  HPI Pt returns for f/u of primary hyperparathyroidism (dx'ed 2017 (it was normal in 2016); she has never had osteoporosis, or urolithiasis; she does not take vitamin-D supplement; these labs were normal: 24-HR urine Ca++,1,25-OH vit-D, TSH, phos, Mg++, and BMD).  She reports slightly decreased muscle strength throughout the body, and assoc constipation. Past Medical History:  Diagnosis Date  . Abnormal liver function tests   . AICD (automatic cardioverter/defibrillator) present    [employer]  . Anxiety   . Aortic stenosis   . Benign neoplasm of colon 04/19/2001   Hyperplastic  . Bicuspid aortic valve   . Cardiomyopathy secondary   . CHF (congestive heart failure) ([place])   . Coronary artery disease   . Depression   . Headache   . Heart murmur   . HTN (hypertension)   . Hypercalcemia   . Hyperlipidemia   . S/P ICD (internal cardiac defibrillator) procedure     Past Surgical History:  Procedure Laterality Date  . AORTIC VALVE REPLACEMENT N/A 11/16/2016   Procedure: AORTIC VALVE REPLACEMENT (AVR);  Surgeon: Gaye Pollack, MD;  Location: [facility];  Service: Open Heart Surgery;  Laterality: N/A;  23mm Perimount Magna Ease Pericardial Bioprosthesis  . BREAST EXCISIONAL BIOPSY Left 1984  . CARDIAC CATHETERIZATION N/A 07/05/2016   Procedure: Right/Left Heart Cath and Coronary Angiography;  Surgeon: Burnell Blanks, MD;  Location: [facility];  Service: Cardiovascular;  Laterality: N/A;  . CHOLECYSTECTOMY    . implantation of a Guidant biventricular ICD    . laparoscopic cholecystectomy with intraoperative cholangiogram    . TEE WITHOUT CARDIOVERSION N/A 11/16/2016   Procedure: TRANSESOPHAGEAL ECHOCARDIOGRAM (TEE);  Surgeon: Gaye Pollack, MD;  Location: [facility];  Service: Open Heart Surgery;  Laterality: N/A;    Social History   Socioeconomic History  . Marital  status: Single    Spouse name: Not on file  . Number of children: 1  . Years of education: Not on file  . Highest education level: Not on file  Occupational History  . Occupation: Geophysicist/field seismologist: UNEMPLOYED  Social Needs  . Financial resource strain: Not on file  . Food insecurity:    Worry: Not on file    Inability: Not on file  . Transportation needs:    Medical: Not on file    Non-medical: Not on file  Tobacco Use  . Smoking status: Former Smoker    Packs/day: 0.25    Years: 40.00    Pack years: 10.00    Types: Cigarettes    Last attempt to quit: 09/25/2010    Years since quitting: 7.6  . Smokeless tobacco: Never Used  . Tobacco comment: patient says she smokes 4 cigarettes a day  Substance and Sexual Activity  . Alcohol use: Yes    Alcohol/week: 0.0 standard drinks    Comment: occasional  . Drug use: No  . Sexual activity: Not on file  Lifestyle  . Physical activity:    Days per week: Not on file    Minutes per session: Not on file  . Stress: Not on file  Relationships  . Social connections:    Talks on phone: Not on file    Gets together: Not on file    Attends religious service: Not on file    Active member of club or organization: Not on  file    Attends meetings of clubs or organizations: Not on file    Relationship status: Not on file  . Intimate partner violence:    Fear of current or ex partner: Not on file    Emotionally abused: Not on file    Physically abused: Not on file    Forced sexual activity: Not on file  Other Topics Concern  . Not on file  Social History Narrative  . Not on file    Current Outpatient Medications on File Prior to Visit  Medication Sig Dispense Refill  . acetaminophen (TYLENOL) 325 MG tablet Take 325 mg by mouth every 6 (six) hours as needed for mild pain.    Marland Kitchen aspirin EC 81 MG tablet Take 1 tablet (81 mg total) daily by mouth. 90 tablet 3  . carvedilol (COREG) 25 MG tablet Take 25 mg by mouth 2 (two)  times daily with a meal.     . furosemide (LASIX) 20 MG tablet Take 20 mg by mouth daily.    . Multiple Vitamin (MULTIVITAMIN WITH MINERALS) TABS tablet Take 1 tablet by mouth at bedtime.    Marland Kitchen olmesartan (BENICAR) 40 MG tablet Take 0.5 tablets (20 mg total) by mouth daily. 45 tablet 3  . Polyethyl Glycol-Propyl Glycol (SYSTANE OP) Place 1-2 drops into both eyes 3 (three) times daily as needed (for dry eyes.).    Marland Kitchen polyethylene glycol (MIRALAX / GLYCOLAX) packet Take 17 g by mouth daily. 14 each 0  . rosuvastatin (CRESTOR) 10 MG tablet Take 1 tablet (10 mg total) by mouth daily. 90 tablet 3  . sertraline (ZOLOFT) 100 MG tablet Take 100 mg by mouth daily.      No current facility-administered medications on file prior to visit.     Allergies  Allergen Reactions  . Trospium Chloride Er Other (See Comments)    Constipation     Family History  Problem Relation Age of Onset  . Pancreatic cancer Mother   . Colon cancer Maternal Aunt   . Bone cancer Brother   . Heart attack Brother   . Hypercalcemia Neg Hx     BP (!) 136/100 (BP Location: Right Arm, Patient Position: Sitting, Cuff Size: Normal)   Pulse 68   Ht 5\' 4"  (1.626 m)   Wt 131 lb 1.6 oz (59.5 kg)   SpO2 97%   BMI 22.50 kg/m    Review of Systems denies weight loss, galactorrhea, hematuria, menopausal sxs, numbness, arthralgias, abdominal pain, myalgias, urinary frequency, hypoglycemia, skin rash, visual loss, sob, diarrhea, easy bruising, and depression. She has rhinorrhea and difficulty with concentration     Objective:   Physical Exam VS: see vs page GEN: no distress HEAD: head: no deformity eyes: no periorbital swelling, no proptosis external nose and ears are normal mouth: no lesion seen NECK: supple, thyroid is not enlarged CHEST WALL: no deformity.  Old healed surgical scar (median sternotomy) LUNGS: clear to auscultation CV: reg rate and rhythm, no murmur ABD: abdomen is soft, nontender.  no  hepatosplenomegaly.  not distended.  no hernia MUSCULOSKELETAL: muscle bulk and strength are grossly normal.  no obvious joint swelling.  gait is normal and steady.  EXTEMITIES: no deformity.  no ulcer on the feet.  feet are of normal color and temp.  no edema PULSES: dorsalis pedis intact bilat.  no carotid bruit NEURO:  cn 2-12 grossly intact.   readily moves all 4's.  sensation is intact to touch on the feet.  SKIN:  Normal  texture and temperature.  No rash or suspicious lesion is visible.   NODES:  None palpable at the neck PSYCH: alert, well-oriented.  Does not appear anxious nor depressed.    Lab Results  Component Value Date   PTH 142 (H) 06/21/2016   CALCIUM 10.7 (H) 04/05/2018   CAION 1.32 11/17/2016   PHOS 2.5 02/16/2016   outside test results are reviewed: Ca++=11.4     Assessment & Plan:  HTN: is noted today primary hyperparathyroidism: recheck today Muscle weakness: in a borderline situation, this might be an indication for surgery  Patient Instructions  Your blood pressure is high today.  Please see your primary care provider soon, to have it rechecked.   Let's recheck the bone density.   blood tests are requested for you today.  We'll let you know about the results. Please come back for a follow-up appointment in 1 year.

## 2018-05-15 NOTE — Patient Instructions (Addendum)
Your blood pressure is high today.  Please see your primary care provider soon, to have it rechecked.   Let's recheck the bone density.   blood tests are requested for you today.  We'll let you know about the results. Please come back for a follow-up appointment in 1 year.

## 2018-05-16 ENCOUNTER — Telehealth: Payer: Self-pay | Admitting: Endocrinology

## 2018-05-16 DIAGNOSIS — Z95810 Presence of automatic (implantable) cardiac defibrillator: Secondary | ICD-10-CM | POA: Diagnosis not present

## 2018-05-16 DIAGNOSIS — R69 Illness, unspecified: Secondary | ICD-10-CM | POA: Diagnosis not present

## 2018-05-16 DIAGNOSIS — N393 Stress incontinence (female) (male): Secondary | ICD-10-CM | POA: Diagnosis not present

## 2018-05-16 DIAGNOSIS — I110 Hypertensive heart disease with heart failure: Secondary | ICD-10-CM | POA: Diagnosis not present

## 2018-05-16 DIAGNOSIS — E7800 Pure hypercholesterolemia, unspecified: Secondary | ICD-10-CM | POA: Diagnosis not present

## 2018-05-16 DIAGNOSIS — I429 Cardiomyopathy, unspecified: Secondary | ICD-10-CM | POA: Diagnosis not present

## 2018-05-16 DIAGNOSIS — I5022 Chronic systolic (congestive) heart failure: Secondary | ICD-10-CM | POA: Diagnosis not present

## 2018-05-16 DIAGNOSIS — Z7982 Long term (current) use of aspirin: Secondary | ICD-10-CM | POA: Diagnosis not present

## 2018-05-16 DIAGNOSIS — N3281 Overactive bladder: Secondary | ICD-10-CM | POA: Diagnosis not present

## 2018-05-16 NOTE — Telephone Encounter (Signed)
error 

## 2018-05-17 DIAGNOSIS — I5022 Chronic systolic (congestive) heart failure: Secondary | ICD-10-CM | POA: Diagnosis not present

## 2018-05-17 DIAGNOSIS — N393 Stress incontinence (female) (male): Secondary | ICD-10-CM | POA: Diagnosis not present

## 2018-05-17 DIAGNOSIS — R69 Illness, unspecified: Secondary | ICD-10-CM | POA: Diagnosis not present

## 2018-05-17 DIAGNOSIS — Z95810 Presence of automatic (implantable) cardiac defibrillator: Secondary | ICD-10-CM | POA: Diagnosis not present

## 2018-05-17 DIAGNOSIS — E7800 Pure hypercholesterolemia, unspecified: Secondary | ICD-10-CM | POA: Diagnosis not present

## 2018-05-17 DIAGNOSIS — I429 Cardiomyopathy, unspecified: Secondary | ICD-10-CM | POA: Diagnosis not present

## 2018-05-17 DIAGNOSIS — I110 Hypertensive heart disease with heart failure: Secondary | ICD-10-CM | POA: Diagnosis not present

## 2018-05-17 DIAGNOSIS — Z7982 Long term (current) use of aspirin: Secondary | ICD-10-CM | POA: Diagnosis not present

## 2018-05-17 DIAGNOSIS — N3281 Overactive bladder: Secondary | ICD-10-CM | POA: Diagnosis not present

## 2018-05-17 LAB — PTH, INTACT AND CALCIUM
Calcium: 10.9 mg/dL — ABNORMAL HIGH (ref 8.6–10.4)
PTH: 137 pg/mL — ABNORMAL HIGH (ref 14–64)

## 2018-05-21 DIAGNOSIS — I110 Hypertensive heart disease with heart failure: Secondary | ICD-10-CM | POA: Diagnosis not present

## 2018-05-21 DIAGNOSIS — Z95810 Presence of automatic (implantable) cardiac defibrillator: Secondary | ICD-10-CM | POA: Diagnosis not present

## 2018-05-21 DIAGNOSIS — I5022 Chronic systolic (congestive) heart failure: Secondary | ICD-10-CM | POA: Diagnosis not present

## 2018-05-21 DIAGNOSIS — E7800 Pure hypercholesterolemia, unspecified: Secondary | ICD-10-CM | POA: Diagnosis not present

## 2018-05-21 DIAGNOSIS — I429 Cardiomyopathy, unspecified: Secondary | ICD-10-CM | POA: Diagnosis not present

## 2018-05-21 DIAGNOSIS — Z7982 Long term (current) use of aspirin: Secondary | ICD-10-CM | POA: Diagnosis not present

## 2018-05-21 DIAGNOSIS — N393 Stress incontinence (female) (male): Secondary | ICD-10-CM | POA: Diagnosis not present

## 2018-05-21 DIAGNOSIS — N3281 Overactive bladder: Secondary | ICD-10-CM | POA: Diagnosis not present

## 2018-05-21 DIAGNOSIS — R69 Illness, unspecified: Secondary | ICD-10-CM | POA: Diagnosis not present

## 2018-05-22 ENCOUNTER — Inpatient Hospital Stay: Admission: RE | Admit: 2018-05-22 | Payer: Medicare HMO | Source: Ambulatory Visit

## 2018-05-23 DIAGNOSIS — I429 Cardiomyopathy, unspecified: Secondary | ICD-10-CM | POA: Diagnosis not present

## 2018-05-23 DIAGNOSIS — I5022 Chronic systolic (congestive) heart failure: Secondary | ICD-10-CM | POA: Diagnosis not present

## 2018-05-23 DIAGNOSIS — N3281 Overactive bladder: Secondary | ICD-10-CM | POA: Diagnosis not present

## 2018-05-23 DIAGNOSIS — Z7982 Long term (current) use of aspirin: Secondary | ICD-10-CM | POA: Diagnosis not present

## 2018-05-23 DIAGNOSIS — I110 Hypertensive heart disease with heart failure: Secondary | ICD-10-CM | POA: Diagnosis not present

## 2018-05-23 DIAGNOSIS — R69 Illness, unspecified: Secondary | ICD-10-CM | POA: Diagnosis not present

## 2018-05-23 DIAGNOSIS — N393 Stress incontinence (female) (male): Secondary | ICD-10-CM | POA: Diagnosis not present

## 2018-05-23 DIAGNOSIS — E7800 Pure hypercholesterolemia, unspecified: Secondary | ICD-10-CM | POA: Diagnosis not present

## 2018-05-23 DIAGNOSIS — Z95810 Presence of automatic (implantable) cardiac defibrillator: Secondary | ICD-10-CM | POA: Diagnosis not present

## 2018-05-24 ENCOUNTER — Ambulatory Visit (INDEPENDENT_AMBULATORY_CARE_PROVIDER_SITE_OTHER)
Admission: RE | Admit: 2018-05-24 | Discharge: 2018-05-24 | Disposition: A | Discharge status: Home / Self Care | Payer: Medicare HMO | Source: Ambulatory Visit | Attending: Endocrinology | Admitting: Endocrinology

## 2018-05-24 DIAGNOSIS — E210 Primary hyperparathyroidism: Secondary | ICD-10-CM | POA: Diagnosis not present

## 2018-05-26 DIAGNOSIS — R69 Illness, unspecified: Secondary | ICD-10-CM | POA: Diagnosis not present

## 2018-05-26 DIAGNOSIS — Z95810 Presence of automatic (implantable) cardiac defibrillator: Secondary | ICD-10-CM | POA: Diagnosis not present

## 2018-05-26 DIAGNOSIS — I5022 Chronic systolic (congestive) heart failure: Secondary | ICD-10-CM | POA: Diagnosis not present

## 2018-05-26 DIAGNOSIS — N393 Stress incontinence (female) (male): Secondary | ICD-10-CM | POA: Diagnosis not present

## 2018-05-26 DIAGNOSIS — I429 Cardiomyopathy, unspecified: Secondary | ICD-10-CM | POA: Diagnosis not present

## 2018-05-26 DIAGNOSIS — I110 Hypertensive heart disease with heart failure: Secondary | ICD-10-CM | POA: Diagnosis not present

## 2018-05-26 DIAGNOSIS — Z7982 Long term (current) use of aspirin: Secondary | ICD-10-CM | POA: Diagnosis not present

## 2018-05-26 DIAGNOSIS — N3281 Overactive bladder: Secondary | ICD-10-CM | POA: Diagnosis not present

## 2018-05-26 DIAGNOSIS — E7800 Pure hypercholesterolemia, unspecified: Secondary | ICD-10-CM | POA: Diagnosis not present

## 2018-05-28 DIAGNOSIS — I429 Cardiomyopathy, unspecified: Secondary | ICD-10-CM | POA: Diagnosis not present

## 2018-05-28 DIAGNOSIS — N393 Stress incontinence (female) (male): Secondary | ICD-10-CM | POA: Diagnosis not present

## 2018-05-28 DIAGNOSIS — N3281 Overactive bladder: Secondary | ICD-10-CM | POA: Diagnosis not present

## 2018-05-28 DIAGNOSIS — I110 Hypertensive heart disease with heart failure: Secondary | ICD-10-CM | POA: Diagnosis not present

## 2018-05-28 DIAGNOSIS — I5022 Chronic systolic (congestive) heart failure: Secondary | ICD-10-CM | POA: Diagnosis not present

## 2018-05-28 DIAGNOSIS — Z95810 Presence of automatic (implantable) cardiac defibrillator: Secondary | ICD-10-CM | POA: Diagnosis not present

## 2018-05-28 DIAGNOSIS — R69 Illness, unspecified: Secondary | ICD-10-CM | POA: Diagnosis not present

## 2018-05-28 DIAGNOSIS — E7800 Pure hypercholesterolemia, unspecified: Secondary | ICD-10-CM | POA: Diagnosis not present

## 2018-05-28 DIAGNOSIS — Z7982 Long term (current) use of aspirin: Secondary | ICD-10-CM | POA: Diagnosis not present

## 2018-05-29 DIAGNOSIS — I5022 Chronic systolic (congestive) heart failure: Secondary | ICD-10-CM | POA: Diagnosis not present

## 2018-05-29 DIAGNOSIS — N3281 Overactive bladder: Secondary | ICD-10-CM | POA: Diagnosis not present

## 2018-05-29 DIAGNOSIS — R69 Illness, unspecified: Secondary | ICD-10-CM | POA: Diagnosis not present

## 2018-05-29 DIAGNOSIS — I429 Cardiomyopathy, unspecified: Secondary | ICD-10-CM | POA: Diagnosis not present

## 2018-05-29 DIAGNOSIS — Z7982 Long term (current) use of aspirin: Secondary | ICD-10-CM | POA: Diagnosis not present

## 2018-05-29 DIAGNOSIS — Z95810 Presence of automatic (implantable) cardiac defibrillator: Secondary | ICD-10-CM | POA: Diagnosis not present

## 2018-05-29 DIAGNOSIS — I110 Hypertensive heart disease with heart failure: Secondary | ICD-10-CM | POA: Diagnosis not present

## 2018-05-29 DIAGNOSIS — E7800 Pure hypercholesterolemia, unspecified: Secondary | ICD-10-CM | POA: Diagnosis not present

## 2018-05-29 DIAGNOSIS — N393 Stress incontinence (female) (male): Secondary | ICD-10-CM | POA: Diagnosis not present

## 2018-05-31 DIAGNOSIS — Z23 Encounter for immunization: Secondary | ICD-10-CM | POA: Diagnosis not present

## 2018-05-31 DIAGNOSIS — Z79899 Other long term (current) drug therapy: Secondary | ICD-10-CM | POA: Diagnosis not present

## 2018-05-31 DIAGNOSIS — E7800 Pure hypercholesterolemia, unspecified: Secondary | ICD-10-CM | POA: Diagnosis not present

## 2018-05-31 DIAGNOSIS — I10 Essential (primary) hypertension: Secondary | ICD-10-CM | POA: Diagnosis not present

## 2018-05-31 DIAGNOSIS — I5022 Chronic systolic (congestive) heart failure: Secondary | ICD-10-CM | POA: Diagnosis not present

## 2018-05-31 DIAGNOSIS — R69 Illness, unspecified: Secondary | ICD-10-CM | POA: Diagnosis not present

## 2018-05-31 DIAGNOSIS — R413 Other amnesia: Secondary | ICD-10-CM | POA: Diagnosis not present

## 2018-05-31 DIAGNOSIS — N3281 Overactive bladder: Secondary | ICD-10-CM | POA: Diagnosis not present

## 2018-06-03 ENCOUNTER — Telehealth: Payer: Self-pay | Admitting: Neurology

## 2018-06-03 DIAGNOSIS — R69 Illness, unspecified: Secondary | ICD-10-CM | POA: Diagnosis not present

## 2018-06-03 DIAGNOSIS — I5022 Chronic systolic (congestive) heart failure: Secondary | ICD-10-CM | POA: Diagnosis not present

## 2018-06-03 DIAGNOSIS — G3184 Mild cognitive impairment, so stated: Secondary | ICD-10-CM

## 2018-06-03 DIAGNOSIS — F329 Major depressive disorder, single episode, unspecified: Secondary | ICD-10-CM

## 2018-06-03 DIAGNOSIS — N3281 Overactive bladder: Secondary | ICD-10-CM | POA: Diagnosis not present

## 2018-06-03 DIAGNOSIS — Z95810 Presence of automatic (implantable) cardiac defibrillator: Secondary | ICD-10-CM | POA: Diagnosis not present

## 2018-06-03 DIAGNOSIS — I429 Cardiomyopathy, unspecified: Secondary | ICD-10-CM | POA: Diagnosis not present

## 2018-06-03 DIAGNOSIS — E7800 Pure hypercholesterolemia, unspecified: Secondary | ICD-10-CM | POA: Diagnosis not present

## 2018-06-03 DIAGNOSIS — F32A Depression, unspecified: Secondary | ICD-10-CM

## 2018-06-03 DIAGNOSIS — I110 Hypertensive heart disease with heart failure: Secondary | ICD-10-CM | POA: Diagnosis not present

## 2018-06-03 DIAGNOSIS — N393 Stress incontinence (female) (male): Secondary | ICD-10-CM | POA: Diagnosis not present

## 2018-06-03 DIAGNOSIS — Z7982 Long term (current) use of aspirin: Secondary | ICD-10-CM | POA: Diagnosis not present

## 2018-06-03 NOTE — Telephone Encounter (Signed)
Also a message came through the answering service regarding having lab work as well? Thanks

## 2018-06-03 NOTE — Telephone Encounter (Signed)
Patient called and left a voicemail stating that she needed to follow up on a MRI that was supposed to be schedule from the nurse. She wanted to speak with [place]. Please call her back at [phone]. Thanks!

## 2018-06-04 NOTE — Telephone Encounter (Signed)
Patient called back regarding needing to set up an MRI. Thanks

## 2018-06-04 NOTE — Telephone Encounter (Signed)
Patient called again and left a voicemail about a MRI. Please call her back at [phone]. Thanks!

## 2018-06-05 DIAGNOSIS — Z7982 Long term (current) use of aspirin: Secondary | ICD-10-CM | POA: Diagnosis not present

## 2018-06-05 DIAGNOSIS — I429 Cardiomyopathy, unspecified: Secondary | ICD-10-CM | POA: Diagnosis not present

## 2018-06-05 DIAGNOSIS — I5022 Chronic systolic (congestive) heart failure: Secondary | ICD-10-CM | POA: Diagnosis not present

## 2018-06-05 DIAGNOSIS — N3281 Overactive bladder: Secondary | ICD-10-CM | POA: Diagnosis not present

## 2018-06-05 DIAGNOSIS — E7800 Pure hypercholesterolemia, unspecified: Secondary | ICD-10-CM | POA: Diagnosis not present

## 2018-06-05 DIAGNOSIS — I110 Hypertensive heart disease with heart failure: Secondary | ICD-10-CM | POA: Diagnosis not present

## 2018-06-05 DIAGNOSIS — Z95810 Presence of automatic (implantable) cardiac defibrillator: Secondary | ICD-10-CM | POA: Diagnosis not present

## 2018-06-05 DIAGNOSIS — N393 Stress incontinence (female) (male): Secondary | ICD-10-CM | POA: Diagnosis not present

## 2018-06-05 DIAGNOSIS — R69 Illness, unspecified: Secondary | ICD-10-CM | POA: Diagnosis not present

## 2018-06-05 NOTE — Telephone Encounter (Signed)
LMOM advising pt that [facility] Sched[facility] will be in touch with her.

## 2018-06-05 NOTE — Telephone Encounter (Signed)
Spoke with Manuela Schwartz at [facility] [employer] - gave all information for pt's defibrillator.  Manuela Schwartz states that she will forward information to MRI department to see if device is compatible with their MRI machine.  Manuela Schwartz will call pt to let her know if compatible or not as well as to schedule.

## 2018-06-05 NOTE — Telephone Encounter (Signed)
Spoke with pt.  She seemed very confused.  States that she never spoke with [facility] and was under the impression that I could make her appointment with them.  Looking in chart - pt spoke with [place] on 7/29 and was advised to call our office to let us know that she has a defibrillator.  Let pt know that I would re-enter orders and have them sent to [facility].  Will return call with appointment information.

## 2018-06-06 ENCOUNTER — Telehealth: Payer: Self-pay | Admitting: Neurology

## 2018-06-06 DIAGNOSIS — N3281 Overactive bladder: Secondary | ICD-10-CM | POA: Diagnosis not present

## 2018-06-06 DIAGNOSIS — I110 Hypertensive heart disease with heart failure: Secondary | ICD-10-CM | POA: Diagnosis not present

## 2018-06-06 DIAGNOSIS — Z95810 Presence of automatic (implantable) cardiac defibrillator: Secondary | ICD-10-CM | POA: Diagnosis not present

## 2018-06-06 DIAGNOSIS — R413 Other amnesia: Secondary | ICD-10-CM

## 2018-06-06 DIAGNOSIS — E7800 Pure hypercholesterolemia, unspecified: Secondary | ICD-10-CM | POA: Diagnosis not present

## 2018-06-06 DIAGNOSIS — I429 Cardiomyopathy, unspecified: Secondary | ICD-10-CM | POA: Diagnosis not present

## 2018-06-06 DIAGNOSIS — R69 Illness, unspecified: Secondary | ICD-10-CM | POA: Diagnosis not present

## 2018-06-06 DIAGNOSIS — I5022 Chronic systolic (congestive) heart failure: Secondary | ICD-10-CM | POA: Diagnosis not present

## 2018-06-06 DIAGNOSIS — Z7982 Long term (current) use of aspirin: Secondary | ICD-10-CM | POA: Diagnosis not present

## 2018-06-06 DIAGNOSIS — N393 Stress incontinence (female) (male): Secondary | ICD-10-CM | POA: Diagnosis not present

## 2018-06-06 NOTE — Telephone Encounter (Signed)
Manuela Schwartz from [facility] called and left a voicemail stating that orders for a MRI of the brain were put in on this patient. The patient has a defibrillator so it's not okay to scan her. If you need to call her it's [phone]. Thanks!

## 2018-06-06 NOTE — Telephone Encounter (Signed)
Order for CT placed and forwarded to [facility] for scheduling

## 2018-06-07 DIAGNOSIS — Z95810 Presence of automatic (implantable) cardiac defibrillator: Secondary | ICD-10-CM | POA: Diagnosis not present

## 2018-06-07 DIAGNOSIS — I110 Hypertensive heart disease with heart failure: Secondary | ICD-10-CM | POA: Diagnosis not present

## 2018-06-07 DIAGNOSIS — N3281 Overactive bladder: Secondary | ICD-10-CM | POA: Diagnosis not present

## 2018-06-07 DIAGNOSIS — Z7982 Long term (current) use of aspirin: Secondary | ICD-10-CM | POA: Diagnosis not present

## 2018-06-07 DIAGNOSIS — E7800 Pure hypercholesterolemia, unspecified: Secondary | ICD-10-CM | POA: Diagnosis not present

## 2018-06-07 DIAGNOSIS — I5022 Chronic systolic (congestive) heart failure: Secondary | ICD-10-CM | POA: Diagnosis not present

## 2018-06-07 DIAGNOSIS — I429 Cardiomyopathy, unspecified: Secondary | ICD-10-CM | POA: Diagnosis not present

## 2018-06-07 DIAGNOSIS — N393 Stress incontinence (female) (male): Secondary | ICD-10-CM | POA: Diagnosis not present

## 2018-06-07 DIAGNOSIS — R69 Illness, unspecified: Secondary | ICD-10-CM | POA: Diagnosis not present

## 2018-06-11 DIAGNOSIS — I429 Cardiomyopathy, unspecified: Secondary | ICD-10-CM | POA: Diagnosis not present

## 2018-06-11 DIAGNOSIS — N393 Stress incontinence (female) (male): Secondary | ICD-10-CM | POA: Diagnosis not present

## 2018-06-11 DIAGNOSIS — N3281 Overactive bladder: Secondary | ICD-10-CM | POA: Diagnosis not present

## 2018-06-11 DIAGNOSIS — R69 Illness, unspecified: Secondary | ICD-10-CM | POA: Diagnosis not present

## 2018-06-11 DIAGNOSIS — Z7982 Long term (current) use of aspirin: Secondary | ICD-10-CM | POA: Diagnosis not present

## 2018-06-11 DIAGNOSIS — I110 Hypertensive heart disease with heart failure: Secondary | ICD-10-CM | POA: Diagnosis not present

## 2018-06-11 DIAGNOSIS — I5022 Chronic systolic (congestive) heart failure: Secondary | ICD-10-CM | POA: Diagnosis not present

## 2018-06-11 DIAGNOSIS — Z95810 Presence of automatic (implantable) cardiac defibrillator: Secondary | ICD-10-CM | POA: Diagnosis not present

## 2018-06-11 DIAGNOSIS — E7800 Pure hypercholesterolemia, unspecified: Secondary | ICD-10-CM | POA: Diagnosis not present

## 2018-06-13 ENCOUNTER — Ambulatory Visit
Admission: RE | Admit: 2018-06-13 | Discharge: 2018-06-13 | Disposition: A | Discharge status: Home / Self Care | Payer: Medicare HMO | Source: Ambulatory Visit | Attending: Neurology | Admitting: Neurology

## 2018-06-13 DIAGNOSIS — R413 Other amnesia: Secondary | ICD-10-CM

## 2018-06-13 DIAGNOSIS — N393 Stress incontinence (female) (male): Secondary | ICD-10-CM | POA: Diagnosis not present

## 2018-06-13 DIAGNOSIS — R269 Unspecified abnormalities of gait and mobility: Secondary | ICD-10-CM | POA: Diagnosis not present

## 2018-06-13 DIAGNOSIS — Z7982 Long term (current) use of aspirin: Secondary | ICD-10-CM | POA: Diagnosis not present

## 2018-06-13 DIAGNOSIS — I5022 Chronic systolic (congestive) heart failure: Secondary | ICD-10-CM | POA: Diagnosis not present

## 2018-06-13 DIAGNOSIS — I429 Cardiomyopathy, unspecified: Secondary | ICD-10-CM | POA: Diagnosis not present

## 2018-06-13 DIAGNOSIS — I110 Hypertensive heart disease with heart failure: Secondary | ICD-10-CM | POA: Diagnosis not present

## 2018-06-13 DIAGNOSIS — N3281 Overactive bladder: Secondary | ICD-10-CM | POA: Diagnosis not present

## 2018-06-13 DIAGNOSIS — R69 Illness, unspecified: Secondary | ICD-10-CM | POA: Diagnosis not present

## 2018-06-13 DIAGNOSIS — E7800 Pure hypercholesterolemia, unspecified: Secondary | ICD-10-CM | POA: Diagnosis not present

## 2018-06-13 DIAGNOSIS — Z95810 Presence of automatic (implantable) cardiac defibrillator: Secondary | ICD-10-CM | POA: Diagnosis not present

## 2018-06-13 DIAGNOSIS — M6281 Muscle weakness (generalized): Secondary | ICD-10-CM | POA: Diagnosis not present

## 2018-06-14 ENCOUNTER — Telehealth: Payer: Self-pay | Admitting: Neurology

## 2018-06-14 DIAGNOSIS — R69 Illness, unspecified: Secondary | ICD-10-CM | POA: Diagnosis not present

## 2018-06-14 DIAGNOSIS — I5022 Chronic systolic (congestive) heart failure: Secondary | ICD-10-CM | POA: Diagnosis not present

## 2018-06-14 DIAGNOSIS — I429 Cardiomyopathy, unspecified: Secondary | ICD-10-CM | POA: Diagnosis not present

## 2018-06-14 NOTE — Telephone Encounter (Signed)
Patient's daughter called in wanting to give you her number for when the recent MRI results come in. She also had some things she wanted to speak with a nurse about regarding the patient. Her number is [phone]. She stated you can call her at the same time the MRI results come in and just talk to her about it then. Thanks!

## 2018-06-17 NOTE — Telephone Encounter (Signed)
Spoke with pt's niece, Dewaine Oats.  Relayed MRI results.  Dewaine Oats states that she will be present at pt's next visit - October 9 @ 3:30PM.

## 2018-06-18 ENCOUNTER — Other Ambulatory Visit: Payer: Self-pay | Admitting: Cardiovascular Disease

## 2018-06-19 ENCOUNTER — Telehealth: Payer: Self-pay | Admitting: Neurology

## 2018-06-19 DIAGNOSIS — Z95810 Presence of automatic (implantable) cardiac defibrillator: Secondary | ICD-10-CM | POA: Diagnosis not present

## 2018-06-19 DIAGNOSIS — I110 Hypertensive heart disease with heart failure: Secondary | ICD-10-CM | POA: Diagnosis not present

## 2018-06-19 DIAGNOSIS — R69 Illness, unspecified: Secondary | ICD-10-CM | POA: Diagnosis not present

## 2018-06-19 DIAGNOSIS — I429 Cardiomyopathy, unspecified: Secondary | ICD-10-CM | POA: Diagnosis not present

## 2018-06-19 DIAGNOSIS — N3281 Overactive bladder: Secondary | ICD-10-CM | POA: Diagnosis not present

## 2018-06-19 DIAGNOSIS — N393 Stress incontinence (female) (male): Secondary | ICD-10-CM | POA: Diagnosis not present

## 2018-06-19 DIAGNOSIS — I5022 Chronic systolic (congestive) heart failure: Secondary | ICD-10-CM | POA: Diagnosis not present

## 2018-06-19 DIAGNOSIS — E7800 Pure hypercholesterolemia, unspecified: Secondary | ICD-10-CM | POA: Diagnosis not present

## 2018-06-19 DIAGNOSIS — Z7982 Long term (current) use of aspirin: Secondary | ICD-10-CM | POA: Diagnosis not present

## 2018-06-19 NOTE — Telephone Encounter (Signed)
Patient lmom needing to speak with you. Please Call. Thanks

## 2018-06-20 DIAGNOSIS — I429 Cardiomyopathy, unspecified: Secondary | ICD-10-CM | POA: Diagnosis not present

## 2018-06-20 DIAGNOSIS — Z7982 Long term (current) use of aspirin: Secondary | ICD-10-CM | POA: Diagnosis not present

## 2018-06-20 DIAGNOSIS — Z95810 Presence of automatic (implantable) cardiac defibrillator: Secondary | ICD-10-CM | POA: Diagnosis not present

## 2018-06-20 DIAGNOSIS — N3281 Overactive bladder: Secondary | ICD-10-CM | POA: Diagnosis not present

## 2018-06-20 DIAGNOSIS — I5022 Chronic systolic (congestive) heart failure: Secondary | ICD-10-CM | POA: Diagnosis not present

## 2018-06-20 DIAGNOSIS — N393 Stress incontinence (female) (male): Secondary | ICD-10-CM | POA: Diagnosis not present

## 2018-06-20 DIAGNOSIS — R69 Illness, unspecified: Secondary | ICD-10-CM | POA: Diagnosis not present

## 2018-06-20 DIAGNOSIS — I110 Hypertensive heart disease with heart failure: Secondary | ICD-10-CM | POA: Diagnosis not present

## 2018-06-20 DIAGNOSIS — E7800 Pure hypercholesterolemia, unspecified: Secondary | ICD-10-CM | POA: Diagnosis not present

## 2018-06-20 NOTE — Telephone Encounter (Signed)
Returned call to pt.  No answer.  LMOM asking for return call.

## 2018-06-21 DIAGNOSIS — I5022 Chronic systolic (congestive) heart failure: Secondary | ICD-10-CM | POA: Diagnosis not present

## 2018-06-21 DIAGNOSIS — I110 Hypertensive heart disease with heart failure: Secondary | ICD-10-CM | POA: Diagnosis not present

## 2018-06-21 DIAGNOSIS — N393 Stress incontinence (female) (male): Secondary | ICD-10-CM | POA: Diagnosis not present

## 2018-06-21 DIAGNOSIS — N3281 Overactive bladder: Secondary | ICD-10-CM | POA: Diagnosis not present

## 2018-06-21 DIAGNOSIS — Z7982 Long term (current) use of aspirin: Secondary | ICD-10-CM | POA: Diagnosis not present

## 2018-06-21 DIAGNOSIS — R69 Illness, unspecified: Secondary | ICD-10-CM | POA: Diagnosis not present

## 2018-06-21 DIAGNOSIS — Z95810 Presence of automatic (implantable) cardiac defibrillator: Secondary | ICD-10-CM | POA: Diagnosis not present

## 2018-06-21 DIAGNOSIS — E7800 Pure hypercholesterolemia, unspecified: Secondary | ICD-10-CM | POA: Diagnosis not present

## 2018-06-21 DIAGNOSIS — I429 Cardiomyopathy, unspecified: Secondary | ICD-10-CM | POA: Diagnosis not present

## 2018-06-24 ENCOUNTER — Encounter: Payer: Self-pay | Admitting: Gastroenterology

## 2018-06-25 DIAGNOSIS — R413 Other amnesia: Secondary | ICD-10-CM

## 2018-06-25 HISTORY — DX: Other amnesia: R41.3

## 2018-06-26 DIAGNOSIS — Z95810 Presence of automatic (implantable) cardiac defibrillator: Secondary | ICD-10-CM | POA: Diagnosis not present

## 2018-06-26 DIAGNOSIS — Z7982 Long term (current) use of aspirin: Secondary | ICD-10-CM | POA: Diagnosis not present

## 2018-06-26 DIAGNOSIS — I110 Hypertensive heart disease with heart failure: Secondary | ICD-10-CM | POA: Diagnosis not present

## 2018-06-26 DIAGNOSIS — N3281 Overactive bladder: Secondary | ICD-10-CM | POA: Diagnosis not present

## 2018-06-26 DIAGNOSIS — I5022 Chronic systolic (congestive) heart failure: Secondary | ICD-10-CM | POA: Diagnosis not present

## 2018-06-26 DIAGNOSIS — R69 Illness, unspecified: Secondary | ICD-10-CM | POA: Diagnosis not present

## 2018-06-26 DIAGNOSIS — I429 Cardiomyopathy, unspecified: Secondary | ICD-10-CM | POA: Diagnosis not present

## 2018-06-26 DIAGNOSIS — N393 Stress incontinence (female) (male): Secondary | ICD-10-CM | POA: Diagnosis not present

## 2018-06-26 DIAGNOSIS — E7800 Pure hypercholesterolemia, unspecified: Secondary | ICD-10-CM | POA: Diagnosis not present

## 2018-06-27 DIAGNOSIS — I5022 Chronic systolic (congestive) heart failure: Secondary | ICD-10-CM | POA: Diagnosis not present

## 2018-06-27 DIAGNOSIS — I110 Hypertensive heart disease with heart failure: Secondary | ICD-10-CM | POA: Diagnosis not present

## 2018-06-27 DIAGNOSIS — R69 Illness, unspecified: Secondary | ICD-10-CM | POA: Diagnosis not present

## 2018-06-27 DIAGNOSIS — I429 Cardiomyopathy, unspecified: Secondary | ICD-10-CM | POA: Diagnosis not present

## 2018-06-27 DIAGNOSIS — E7800 Pure hypercholesterolemia, unspecified: Secondary | ICD-10-CM | POA: Diagnosis not present

## 2018-06-27 DIAGNOSIS — N3281 Overactive bladder: Secondary | ICD-10-CM | POA: Diagnosis not present

## 2018-06-27 DIAGNOSIS — Z7982 Long term (current) use of aspirin: Secondary | ICD-10-CM | POA: Diagnosis not present

## 2018-06-27 DIAGNOSIS — N393 Stress incontinence (female) (male): Secondary | ICD-10-CM | POA: Diagnosis not present

## 2018-06-27 DIAGNOSIS — Z95810 Presence of automatic (implantable) cardiac defibrillator: Secondary | ICD-10-CM | POA: Diagnosis not present

## 2018-07-01 DIAGNOSIS — Z7982 Long term (current) use of aspirin: Secondary | ICD-10-CM | POA: Diagnosis not present

## 2018-07-01 DIAGNOSIS — Z95810 Presence of automatic (implantable) cardiac defibrillator: Secondary | ICD-10-CM | POA: Diagnosis not present

## 2018-07-01 DIAGNOSIS — I429 Cardiomyopathy, unspecified: Secondary | ICD-10-CM | POA: Diagnosis not present

## 2018-07-01 DIAGNOSIS — I5022 Chronic systolic (congestive) heart failure: Secondary | ICD-10-CM | POA: Diagnosis not present

## 2018-07-01 DIAGNOSIS — E7800 Pure hypercholesterolemia, unspecified: Secondary | ICD-10-CM | POA: Diagnosis not present

## 2018-07-01 DIAGNOSIS — R69 Illness, unspecified: Secondary | ICD-10-CM | POA: Diagnosis not present

## 2018-07-01 DIAGNOSIS — N3281 Overactive bladder: Secondary | ICD-10-CM | POA: Diagnosis not present

## 2018-07-01 DIAGNOSIS — I110 Hypertensive heart disease with heart failure: Secondary | ICD-10-CM | POA: Diagnosis not present

## 2018-07-01 DIAGNOSIS — N393 Stress incontinence (female) (male): Secondary | ICD-10-CM | POA: Diagnosis not present

## 2018-07-03 ENCOUNTER — Ambulatory Visit: Payer: Medicare HMO | Admitting: Neurology

## 2018-07-03 ENCOUNTER — Other Ambulatory Visit (INDEPENDENT_AMBULATORY_CARE_PROVIDER_SITE_OTHER): Payer: Medicare HMO

## 2018-07-03 ENCOUNTER — Telehealth: Payer: Self-pay

## 2018-07-03 ENCOUNTER — Other Ambulatory Visit: Payer: Self-pay

## 2018-07-03 ENCOUNTER — Encounter: Payer: Self-pay | Admitting: Neurology

## 2018-07-03 ENCOUNTER — Ambulatory Visit (INDEPENDENT_AMBULATORY_CARE_PROVIDER_SITE_OTHER): Payer: Medicare HMO | Admitting: *Deleted

## 2018-07-03 VITALS — BP 142/76 | HR 74 | Ht 63.0 in | Wt 134.0 lb

## 2018-07-03 DIAGNOSIS — G20 Parkinson's disease: Secondary | ICD-10-CM

## 2018-07-03 DIAGNOSIS — Z95810 Presence of automatic (implantable) cardiac defibrillator: Secondary | ICD-10-CM | POA: Diagnosis not present

## 2018-07-03 DIAGNOSIS — F03A0 Unspecified dementia, mild, without behavioral disturbance, psychotic disturbance, mood disturbance, and anxiety: Secondary | ICD-10-CM

## 2018-07-03 DIAGNOSIS — F0390 Unspecified dementia without behavioral disturbance: Secondary | ICD-10-CM

## 2018-07-03 DIAGNOSIS — N393 Stress incontinence (female) (male): Secondary | ICD-10-CM | POA: Diagnosis not present

## 2018-07-03 DIAGNOSIS — Z7982 Long term (current) use of aspirin: Secondary | ICD-10-CM | POA: Diagnosis not present

## 2018-07-03 DIAGNOSIS — I428 Other cardiomyopathies: Secondary | ICD-10-CM

## 2018-07-03 DIAGNOSIS — I110 Hypertensive heart disease with heart failure: Secondary | ICD-10-CM | POA: Diagnosis not present

## 2018-07-03 DIAGNOSIS — I5022 Chronic systolic (congestive) heart failure: Secondary | ICD-10-CM | POA: Diagnosis not present

## 2018-07-03 DIAGNOSIS — R69 Illness, unspecified: Secondary | ICD-10-CM | POA: Diagnosis not present

## 2018-07-03 DIAGNOSIS — E7800 Pure hypercholesterolemia, unspecified: Secondary | ICD-10-CM | POA: Diagnosis not present

## 2018-07-03 DIAGNOSIS — I429 Cardiomyopathy, unspecified: Secondary | ICD-10-CM | POA: Diagnosis not present

## 2018-07-03 DIAGNOSIS — N3281 Overactive bladder: Secondary | ICD-10-CM | POA: Diagnosis not present

## 2018-07-03 MED ORDER — CARBIDOPA-LEVODOPA 25-100 MG PO TABS: ORAL_TABLET | ORAL | 11 refills | Status: DC

## 2018-07-03 NOTE — Patient Instructions (Addendum)
1. Start Sinemet 25/168m: take 1/2 tablet three times a day with meals for a week, then increase to 1 tablet three times a day with meals  2. Bloodwork for autoimmune panel, ANA, ESR, CRP Your provider has requested that you have labwork completed today. [id number] on the second floor of this building before leaving the office today. You do not need to check in. If you are not called within 15 minutes please check with the front desk.   3. Schedule Neurocognitive testing with Dr. BOtelia Limeshave been referred for a neurocognitive evaluation in our office.   The evaluation has two parts.   . The first part of the evaluation is a clinical interview with the neuropsychologist (Dr. MMacarthur Critchley. Please bring someone with you to this appointment if possible, as it is helpful for Dr. BSi Raiderto hear from both you and another adult who knows you well.   . The second part of the evaluation is testing with Dr. BMarcia Brashtechnician (Hinton Dyer. The testing includes a variety of tasks- mostly question-and-answer, some paper-and-pencil. There is nothing you need to do to prepare for this appointment, but having a good night's sleep prior to the testing, and bringing eyeglasses and hearing aids (if you wear them), is advised.   Please note: We have to reserve several hours of the neuropsychologist's time and the psychometrician's time for your evaluation appointment. As such, please note that there is a No-Show fee of $100. If you are unable to attend any of your appointments, please contact our office as soon as possible to reschedule.  4. No further driving  5. Recommend having home aide coming on a daily basis. Look into day programs  6. Follow-up in 4 months, call for any changes  FALL PRECAUTIONS: Be cautious when walking. Scan the area for obstacles that may increase the risk of trips and falls. When getting up in the mornings, sit up at the edge of the bed for a few  minutes before getting out of bed. Consider elevating the bed at the head end to avoid drop of blood pressure when getting up. Walk always in a well-lit room (use night lights in the walls). Avoid area rugs or power cords from appliances in the middle of the walkways. Use a walker or a cane if necessary and consider physical therapy for balance exercise. Get your eyesight checked regularly.  FINANCIAL OVERSIGHT: Supervision, especially oversight when making financial decisions or transactions is also recommended.  HOME SAFETY: Consider the safety of the kitchen when operating appliances like stoves, microwave oven, and blender. Consider having supervision and share cooking responsibilities until no longer able to participate in those. Accidents with firearms and other hazards in the house should be identified and addressed as well.  DRIVING: Regarding driving, in patients with progressive memory problems, driving will be impaired. We advise to have someone else do the driving if trouble finding directions or if minor accidents are reported. Independent driving assessment is available to determine safety of driving.  ABILITY TO BE LEFT ALONE: If patient is unable to contact 911 operator, consider using LifeLine, or when the need is there, arrange for someone to stay with patients. Smoking is a fire hazard, consider supervision or cessation. Risk of wandering should be assessed by caregiver and if detected at any point, supervision and safe proof recommendations should be instituted.  MEDICATION SUPERVISION: Inability to self-administer medication needs to be constantly addressed. Implement a mechanism to ensure safe  administration of the medications.  RECOMMENDATIONS FOR ALL PATIENTS WITH MEMORY PROBLEMS: 1. Continue to exercise (Recommend 30 minutes of walking everyday, or 3 hours every week) 2. Increase social interactions - continue going to [place] and enjoy social gatherings with friends and  family 3. Eat healthy, avoid fried foods and eat more fruits and vegetables 4. Maintain adequate blood pressure, blood sugar, and blood cholesterol level. Reducing the risk of stroke and cardiovascular disease also helps promoting better memory. 5. Avoid stressful situations. Live a simple life and avoid aggravations. Organize your time and prepare for the next day in anticipation. 6. Sleep well, avoid any interruptions of sleep and avoid any distractions in the bedroom that may interfere with adequate sleep quality 7. Avoid sugar, avoid sweets as there is a strong link between excessive sugar intake, diabetes, and cognitive impairment The Mediterranean diet has been shown to help patients reduce the risk of progressive memory disorders and reduces cardiovascular risk. This includes eating fish, eat fruits and green leafy vegetables, nuts like almonds and hazelnuts, walnuts, and also use olive oil. Avoid fast foods and fried foods as much as possible. Avoid sweets and sugar as sugar use has been linked to worsening of memory function.  There is always a concern of gradual progression of memory problems. If this is the case, then we may need to adjust level of care according to patient needs. Support, both to the patient and caregiver, should then be put into place.
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
## 2018-07-03 NOTE — Telephone Encounter (Signed)
LMOVM reminding pt to send remote transmission.   

## 2018-07-04 ENCOUNTER — Telehealth: Payer: Self-pay | Admitting: Neurology

## 2018-07-04 ENCOUNTER — Encounter: Payer: Self-pay | Admitting: Neurology

## 2018-07-04 ENCOUNTER — Other Ambulatory Visit: Payer: Self-pay

## 2018-07-04 DIAGNOSIS — N3281 Overactive bladder: Secondary | ICD-10-CM | POA: Diagnosis not present

## 2018-07-04 DIAGNOSIS — Z95810 Presence of automatic (implantable) cardiac defibrillator: Secondary | ICD-10-CM | POA: Diagnosis not present

## 2018-07-04 DIAGNOSIS — R69 Illness, unspecified: Secondary | ICD-10-CM | POA: Diagnosis not present

## 2018-07-04 DIAGNOSIS — N393 Stress incontinence (female) (male): Secondary | ICD-10-CM | POA: Diagnosis not present

## 2018-07-04 DIAGNOSIS — I429 Cardiomyopathy, unspecified: Secondary | ICD-10-CM | POA: Diagnosis not present

## 2018-07-04 DIAGNOSIS — Z7982 Long term (current) use of aspirin: Secondary | ICD-10-CM | POA: Diagnosis not present

## 2018-07-04 DIAGNOSIS — I110 Hypertensive heart disease with heart failure: Secondary | ICD-10-CM | POA: Diagnosis not present

## 2018-07-04 DIAGNOSIS — E7800 Pure hypercholesterolemia, unspecified: Secondary | ICD-10-CM | POA: Diagnosis not present

## 2018-07-04 DIAGNOSIS — I5022 Chronic systolic (congestive) heart failure: Secondary | ICD-10-CM | POA: Diagnosis not present

## 2018-07-04 LAB — SEDIMENTATION RATE: Sed Rate: 11 mm/h (ref 0–30)

## 2018-07-04 LAB — ANA: Anti Nuclear Antibody(ANA): NEGATIVE

## 2018-07-04 LAB — C-REACTIVE PROTEIN: CRP: 1.5 mg/L (ref <8.0)

## 2018-07-04 MED ORDER — CARBIDOPA-LEVODOPA 25-100 MG PO TABS: ORAL_TABLET | ORAL | 11 refills | Status: DC

## 2018-07-04 NOTE — Progress Notes (Signed)
Remote ICD transmission.   

## 2018-07-04 NOTE — Telephone Encounter (Signed)
Rx sent to [facility] on [address].  There is no more [facility], [facility] has bought them out.

## 2018-07-04 NOTE — Telephone Encounter (Signed)
Patient called in stating she still hasn't received her medication at her pharmacy. It was the new medication you requested she take at her appt yesterday. She uses the [facility]. In [place]. Her number is [phone]. Thanks!

## 2018-07-05 ENCOUNTER — Telehealth: Payer: Self-pay | Admitting: Neurology

## 2018-07-05 DIAGNOSIS — I5022 Chronic systolic (congestive) heart failure: Secondary | ICD-10-CM | POA: Diagnosis not present

## 2018-07-05 DIAGNOSIS — E7800 Pure hypercholesterolemia, unspecified: Secondary | ICD-10-CM | POA: Diagnosis not present

## 2018-07-05 DIAGNOSIS — R69 Illness, unspecified: Secondary | ICD-10-CM | POA: Diagnosis not present

## 2018-07-05 DIAGNOSIS — Z7982 Long term (current) use of aspirin: Secondary | ICD-10-CM | POA: Diagnosis not present

## 2018-07-05 DIAGNOSIS — I429 Cardiomyopathy, unspecified: Secondary | ICD-10-CM | POA: Diagnosis not present

## 2018-07-05 DIAGNOSIS — N3281 Overactive bladder: Secondary | ICD-10-CM | POA: Diagnosis not present

## 2018-07-05 DIAGNOSIS — Z95810 Presence of automatic (implantable) cardiac defibrillator: Secondary | ICD-10-CM | POA: Diagnosis not present

## 2018-07-05 DIAGNOSIS — N393 Stress incontinence (female) (male): Secondary | ICD-10-CM | POA: Diagnosis not present

## 2018-07-05 DIAGNOSIS — I110 Hypertensive heart disease with heart failure: Secondary | ICD-10-CM | POA: Diagnosis not present

## 2018-07-05 NOTE — Telephone Encounter (Signed)
Elmyra Ricks from [facility] called for this patient wanting to know where the recent medication was sent. Please call her back at [phone]. Thanks!

## 2018-07-05 NOTE — Telephone Encounter (Signed)
Returned call.  Spoke with pt.  Advised that her medication was sent to [facility] on [address] yesterday.  Pt states that medication was not there yesterday.  I reminded pt that Rx was originally sent to [facility] as that was the pharmacy she told me she uses.  Rx was sent to [facility] after pt went there yesterday to pick it up.  Pt states that she would go to pharmacy and see if Rx was available.  I advised that she call first, before making the trip.

## 2018-07-08 ENCOUNTER — Ambulatory Visit (AMBULATORY_SURGERY_CENTER): Payer: Medicare HMO | Admitting: Gastroenterology

## 2018-07-08 ENCOUNTER — Encounter: Payer: Self-pay | Admitting: Gastroenterology

## 2018-07-08 VITALS — BP 174/110 | HR 77 | Temp 97.0°F | Resp 17 | Wt 134.0 lb

## 2018-07-08 DIAGNOSIS — Z86010 Personal history of colonic polyps: Secondary | ICD-10-CM | POA: Diagnosis not present

## 2018-07-08 DIAGNOSIS — Z1211 Encounter for screening for malignant neoplasm of colon: Secondary | ICD-10-CM | POA: Diagnosis not present

## 2018-07-08 MED ORDER — SODIUM CHLORIDE 0.9 % IV SOLN: 500.0000 mL | Freq: Once | INTRAVENOUS | Status: DC

## 2018-07-08 NOTE — Progress Notes (Signed)
A and O x3. Report to RN. Tolerated MAC anesthesia well.

## 2018-07-08 NOTE — Patient Instructions (Addendum)
YOU HAD AN ENDOSCOPIC PROCEDURE TODAY AT [facility]:   Refer to the procedure report that was given to you for any specific questions about what was found during the examination.  If the procedure report does not answer your questions, please call your gastroenterologist to clarify.  If you requested that your care partner not be given the details of your procedure findings, then the procedure report has been included in a sealed envelope for you to review at your convenience later.  YOU SHOULD EXPECT: Some feelings of bloating in the abdomen. Passage of more gas than usual.  Walking can help get rid of the air that was put into your GI tract during the procedure and reduce the bloating. If you had a lower endoscopy (such as a colonoscopy or flexible sigmoidoscopy) you may notice spotting of blood in your stool or on the toilet paper. If you underwent a bowel prep for your procedure, you may not have a normal bowel movement for a few days.  Please Note:  You might notice some irritation and congestion in your nose or some drainage.  This is from the oxygen used during your procedure.  There is no need for concern and it should clear up in a day or so.  SYMPTOMS TO REPORT IMMEDIATELY:   Following lower endoscopy (colonoscopy or flexible sigmoidoscopy):  Excessive amounts of blood in the stool  Significant tenderness or worsening of abdominal pains  Swelling of the abdomen that is new, acute  Fever of 100F or higher  For urgent or emergent issues, a gastroenterologist can be reached at any hour by calling [phone].   DIET:  We do recommend a small meal at first, but then you may proceed to your regular diet.  Drink plenty of fluids but you should avoid alcoholic beverages for 24 hours.  ACTIVITY:  You should plan to take it easy for the rest of today and you should NOT DRIVE or use heavy machinery until tomorrow (because of the sedation medicines used during the test).     FOLLOW UP: Our staff will call the number listed on your records the next business day following your procedure to check on you and address any questions or concerns that you may have regarding the information given to you following your procedure. If we do not reach you, we will leave a message.  However, if you are feeling well and you are not experiencing any problems, there is no need to return our call.  We will assume that you have returned to your regular daily activities without incident.  If any biopsies were taken you will be contacted by phone or by letter within the next 1-3 weeks.  Please call us at [phone] if you have not heard about the biopsies in 3 weeks.   Preparation of the colon was indequate Repeat Colonoscopy at next available appointment (within 3 months)    SIGNATURES/CONFIDENTIALITY: You and/or your care partner have signed paperwork which will be entered into your electronic medical record.  These signatures attest to the fact that that the information above on your After Visit Summary has been reviewed and is understood.  Full responsibility of the confidentiality of this discharge information lies with you and/or your care-partner.

## 2018-07-08 NOTE — Op Note (Signed)
[place] Patient Name: [name] Procedure Date: 07/08/2018 11:36 AM MRN: [id number] Endoscopist: Ladene Artist , MD Age: 62 Referring MD:  Date of Birth: 1955/10/03 Gender: Female Account #: [id number] Procedure:                Colonoscopy Indications:              Surveillance: Personal history of adenomatous                            polyps on last colonoscopy > 5 years ago Medicines:                Monitored Anesthesia Care Procedure:                Pre-Anesthesia Assessment:                           - Prior to the procedure, a History and Physical                            was performed, and patient medications and                            allergies were reviewed. The patient's tolerance of                            previous anesthesia was also reviewed. The risks                            and benefits of the procedure and the sedation                            options and risks were discussed with the patient.                            All questions were answered, and informed consent                            was obtained. Prior Anticoagulants: The patient has                            taken no previous anticoagulant or antiplatelet                            agents. ASA Grade Assessment: III - A patient with                            severe systemic disease. After reviewing the risks                            and benefits, the patient was deemed in                            satisfactory condition to undergo the procedure.  After obtaining informed consent, the colonoscope                            was passed under direct vision. Throughout the                            procedure, the patient's blood pressure, pulse, and                            oxygen saturations were monitored continuously. The                            Colonoscope was introduced through the anus and                            advanced to the the  cecum, identified by                            appendiceal orifice and ileocecal valve. The                            ileocecal valve, appendiceal orifice, and rectum                            were photographed. The quality of the bowel                            preparation was inadequate. The colonoscopy was                            performed without difficulty. The patient tolerated                            the procedure well. Scope In: 11:41:35 AM Scope Out: 11:57:01 AM Scope Withdrawal Time: 0 hours 11 minutes 1 second  Total Procedure Duration: 0 hours 15 minutes 26 seconds  Findings:                 The perianal and digital rectal examinations were                            normal.                           A large amount of semi-liquid semi-solid stool was                            found in the entire colon, interfering with                            visualization.                           Internal hemorrhoids were found during  retroflexion. The hemorrhoids were small and Grade                            I (internal hemorrhoids that do not prolapse).                           The exam was otherwise without abnormality on                            direct and retroflexion views. Complications:            No immediate complications. Estimated blood loss:                            None. Estimated Blood Loss:     Estimated blood loss: none. Impression:               - Preparation of the colon was inadequate.                           - Stool in the entire examined colon.                           - Internal hemorrhoids.                           - The examination was otherwise normal on direct                            and retroflexion views.                           - No specimens collected. Recommendation:           - Repeat colonoscopy at next available appointment                            (within 3 months) because the bowel preparation  was                            suboptimal using an extended bowel prep.                           - Patient has a contact number available for                            emergencies. The signs and symptoms of potential                            delayed complications were discussed with the                            patient. Return to normal activities tomorrow.                            Written discharge instructions were provided to the  patient.                           - Resume previous diet.                           - Continue present medications. Ladene Artist, MD 07/08/2018 12:03:01 PM This report has been signed electronically.

## 2018-07-08 NOTE — Progress Notes (Signed)
Pt. First stated that her last  Liquid was at 1030 this AM. When I told her she was to hold liquids at least 2 hours before procedure, she stated she finished at 0615.  I explained to her that it was important to know exactly the last time she drank liquid to prevent aspiration, she stated again that she drank last at 0615 this AM.  She also states that she had cookies and spinach last evening for supper.

## 2018-07-09 ENCOUNTER — Telehealth: Payer: Self-pay

## 2018-07-09 NOTE — Telephone Encounter (Signed)
  Follow up Call-  Call back number 07/08/2018 07/08/2018  Post procedure Call Back phone  # [phone]  Permission to leave phone message Yes Yes  Some recent data might be hidden     Patient questions:  Do you have a fever, pain , or abdominal swelling? No. Pain Score  0 *  Have you tolerated food without any problems? Yes.    Have you been able to return to your normal activities? Yes.    Do you have any questions about your discharge instructions: Diet   No. Medications  No. Follow up visit  No.  Do you have questions or concerns about your Care? No.  Actions: * If pain score is 4 or above: No action needed, pain <4.

## 2018-07-10 DIAGNOSIS — R69 Illness, unspecified: Secondary | ICD-10-CM | POA: Diagnosis not present

## 2018-07-10 DIAGNOSIS — Z95810 Presence of automatic (implantable) cardiac defibrillator: Secondary | ICD-10-CM | POA: Diagnosis not present

## 2018-07-10 DIAGNOSIS — Z7982 Long term (current) use of aspirin: Secondary | ICD-10-CM | POA: Diagnosis not present

## 2018-07-10 DIAGNOSIS — N393 Stress incontinence (female) (male): Secondary | ICD-10-CM | POA: Diagnosis not present

## 2018-07-10 DIAGNOSIS — I429 Cardiomyopathy, unspecified: Secondary | ICD-10-CM | POA: Diagnosis not present

## 2018-07-10 DIAGNOSIS — E7800 Pure hypercholesterolemia, unspecified: Secondary | ICD-10-CM | POA: Diagnosis not present

## 2018-07-10 DIAGNOSIS — N3281 Overactive bladder: Secondary | ICD-10-CM | POA: Diagnosis not present

## 2018-07-10 DIAGNOSIS — I110 Hypertensive heart disease with heart failure: Secondary | ICD-10-CM | POA: Diagnosis not present

## 2018-07-10 DIAGNOSIS — I5022 Chronic systolic (congestive) heart failure: Secondary | ICD-10-CM | POA: Diagnosis not present

## 2018-07-11 ENCOUNTER — Telehealth: Payer: Self-pay

## 2018-07-11 ENCOUNTER — Other Ambulatory Visit: Payer: Self-pay | Admitting: Cardiology

## 2018-07-11 ENCOUNTER — Telehealth: Payer: Self-pay | Admitting: Endocrinology

## 2018-07-11 DIAGNOSIS — I5022 Chronic systolic (congestive) heart failure: Secondary | ICD-10-CM | POA: Diagnosis not present

## 2018-07-11 DIAGNOSIS — Z7982 Long term (current) use of aspirin: Secondary | ICD-10-CM | POA: Diagnosis not present

## 2018-07-11 DIAGNOSIS — I429 Cardiomyopathy, unspecified: Secondary | ICD-10-CM | POA: Diagnosis not present

## 2018-07-11 DIAGNOSIS — N393 Stress incontinence (female) (male): Secondary | ICD-10-CM | POA: Diagnosis not present

## 2018-07-11 DIAGNOSIS — I110 Hypertensive heart disease with heart failure: Secondary | ICD-10-CM | POA: Diagnosis not present

## 2018-07-11 DIAGNOSIS — Z95810 Presence of automatic (implantable) cardiac defibrillator: Secondary | ICD-10-CM | POA: Diagnosis not present

## 2018-07-11 DIAGNOSIS — E7800 Pure hypercholesterolemia, unspecified: Secondary | ICD-10-CM | POA: Diagnosis not present

## 2018-07-11 DIAGNOSIS — N3281 Overactive bladder: Secondary | ICD-10-CM | POA: Diagnosis not present

## 2018-07-11 DIAGNOSIS — R69 Illness, unspecified: Secondary | ICD-10-CM | POA: Diagnosis not present

## 2018-07-11 NOTE — Telephone Encounter (Signed)
-----   Message from Cameron Sprang, MD sent at 07/05/2018 11:48 AM EDT ----- Pls let patient know bloodwork was normal, awaiting paraneoplastic panel if she has not done it yet. Thanks

## 2018-07-11 NOTE — Telephone Encounter (Signed)
Patient stated she had a bone density test done and did not receive any results. Please Advise.

## 2018-07-11 NOTE — Telephone Encounter (Signed)
Spoke with pt relaying message below.  She states that she still has not heard from [place].  She asked about her bone density results - advised that Dr. Cordelia Pen office ordered that test and that she should speak with them about results.

## 2018-07-12 NOTE — Telephone Encounter (Signed)
Documented that I spoke to pt on 05/29/18 in regards to these results, called pt back and lft vm stating this and asked her to call back if she wanted me to go over them again with her.

## 2018-07-18 ENCOUNTER — Ambulatory Visit (AMBULATORY_SURGERY_CENTER): Payer: Self-pay

## 2018-07-18 VITALS — Ht 63.0 in | Wt 137.8 lb

## 2018-07-18 DIAGNOSIS — Z1211 Encounter for screening for malignant neoplasm of colon: Secondary | ICD-10-CM

## 2018-07-18 LAB — CUP PACEART REMOTE DEVICE CHECK
Date Time Interrogation Session: 20191024112002
Implantable Lead Implant Date: 20060630
Implantable Lead Implant Date: 20060630
Implantable Lead Implant Date: 20060630
Implantable Lead Location: 753858
Implantable Lead Location: 753859
Implantable Lead Location: 753860
Implantable Lead Model: 157
Implantable Lead Model: 4086
Implantable Lead Model: 4543
Implantable Lead Serial Number: 119717
Implantable Lead Serial Number: 131677
Implantable Lead Serial Number: 223436
Implantable Pulse Generator Implant Date: 20120131
Pulse Gen Serial Number: 498164

## 2018-07-18 MED ORDER — NA SULFATE-K SULFATE-MG SULF 17.5-3.13-1.6 GM/177ML PO SOLN: 1.0000 | Freq: Once | ORAL | 0 refills | Status: AC

## 2018-07-18 NOTE — Progress Notes (Signed)
Per pt, no allergies to soy or egg products.Pt not taking any weight loss meds or using  O2 at home.  Pt refused emmi video.  Patients brother and son were with the pt at her PV to help answer questions and help pt with prep instructions!

## 2018-07-19 ENCOUNTER — Encounter: Payer: Self-pay | Admitting: Gastroenterology

## 2018-07-24 ENCOUNTER — Telehealth: Payer: Self-pay | Admitting: Neurology

## 2018-07-24 DIAGNOSIS — I429 Cardiomyopathy, unspecified: Secondary | ICD-10-CM | POA: Diagnosis not present

## 2018-07-24 DIAGNOSIS — N3281 Overactive bladder: Secondary | ICD-10-CM | POA: Diagnosis not present

## 2018-07-24 DIAGNOSIS — E7800 Pure hypercholesterolemia, unspecified: Secondary | ICD-10-CM | POA: Diagnosis not present

## 2018-07-24 DIAGNOSIS — Z7982 Long term (current) use of aspirin: Secondary | ICD-10-CM | POA: Diagnosis not present

## 2018-07-24 DIAGNOSIS — R69 Illness, unspecified: Secondary | ICD-10-CM | POA: Diagnosis not present

## 2018-07-24 DIAGNOSIS — I110 Hypertensive heart disease with heart failure: Secondary | ICD-10-CM | POA: Diagnosis not present

## 2018-07-24 DIAGNOSIS — I5022 Chronic systolic (congestive) heart failure: Secondary | ICD-10-CM | POA: Diagnosis not present

## 2018-07-24 DIAGNOSIS — Z95810 Presence of automatic (implantable) cardiac defibrillator: Secondary | ICD-10-CM | POA: Diagnosis not present

## 2018-07-24 DIAGNOSIS — N393 Stress incontinence (female) (male): Secondary | ICD-10-CM | POA: Diagnosis not present

## 2018-07-24 NOTE — Telephone Encounter (Signed)
Called pt's home in attempt to speak with home health nurse.  No answer.  LMOM asking for return call from nurse.

## 2018-07-24 NOTE — Telephone Encounter (Signed)
Spoke with pharmacy.  They state that pt has 11 refills available.  Asked that they fill and I will notify pt.   Called pt.  Advised that she has plenty of refills available at her pharmacy for pick up and that Rx will be ready in about an hour.  Pt states that she has yet to start Carbidopa-levodopa due to it never being sent to pharmacy.  I reminded pt that Rx was sent on 07/04/18 and at that time I confirmed with pharmacy that it was there and ready.  Pt just kept saying "well every time I call they say that it's not there" however pt stated numerous times that she did not know the name of medication.  I asked if there was a family member available that I could speak with as I felt that pt is thoroughly confused about this and may be attempting to fill an old expired Rx thinking it is her new Carbidopa-levodopa Rx.  No one available to speak with me at this time.  Pt states that she has home health nurse coming today between 10:30-11AM. Will call at that time to speak with nurse.

## 2018-07-24 NOTE — Telephone Encounter (Signed)
Lattie Haw called from [facility] regarding this patient and her Sinemet medication. They are saying she cannot find where the medication was called into? Please Call. Thanks

## 2018-07-24 NOTE — Telephone Encounter (Signed)
Patient is needing to speak with the nurse. She left a msg. Please call her back at [phone]. Thanks!

## 2018-07-24 NOTE — Telephone Encounter (Signed)
Pt returned call and LM on office VM stating that she needed to speak with Dr. Amparo Bristol nurse (separate phone encounter was created for this message)   Returned call to pt.  Asked if home health nurse was available to speak with me.  Pt states that nurse has not gotten to her home yet, and asked that I call back in about an hour.  Asked if there was anyone available to speak with me.  Pt states that she is home alone at the moment.

## 2018-07-24 NOTE — Telephone Encounter (Signed)
Returned call.  [name] was not with pt.  Pt stated that [name] left her house this morning. I spoke with pt numerous times this AM and was told that [name] would not be at pt's home until early afternoon (pls see previous phone encounter)  I asked pt if there was anyone available for me to speak with.  Pt stated that she was alone, driving to the pharmacy to pick up her Rx.  I asked which pharmacy she was heading to.  Pt replied "the one on [address]"  I reminded pt that Rx was sent to [facility] on [address].

## 2018-07-24 NOTE — Telephone Encounter (Signed)
pls see previous phone encounter

## 2018-07-24 NOTE — Telephone Encounter (Signed)
Patient called and said that her medication is not at her pharmacy. She uses [facility]. She could not remember the name of the medication. Thanks

## 2018-07-26 ENCOUNTER — Telehealth: Payer: Self-pay | Admitting: Gastroenterology

## 2018-07-26 NOTE — Telephone Encounter (Signed)
Patient asked if she has to get both Miralax and Dulcolax. Explained to her that yes she does need to buy both of them, and they are OTC. No further questions from the pt at this time.

## 2018-07-26 NOTE — Telephone Encounter (Signed)
Patient has questions regarding prep scheduled for procedure next week

## 2018-07-30 DIAGNOSIS — I429 Cardiomyopathy, unspecified: Secondary | ICD-10-CM | POA: Diagnosis not present

## 2018-07-30 DIAGNOSIS — Z7982 Long term (current) use of aspirin: Secondary | ICD-10-CM | POA: Diagnosis not present

## 2018-07-30 DIAGNOSIS — I110 Hypertensive heart disease with heart failure: Secondary | ICD-10-CM | POA: Diagnosis not present

## 2018-07-30 DIAGNOSIS — I5022 Chronic systolic (congestive) heart failure: Secondary | ICD-10-CM | POA: Diagnosis not present

## 2018-07-30 DIAGNOSIS — N393 Stress incontinence (female) (male): Secondary | ICD-10-CM | POA: Diagnosis not present

## 2018-07-30 DIAGNOSIS — E7800 Pure hypercholesterolemia, unspecified: Secondary | ICD-10-CM | POA: Diagnosis not present

## 2018-07-30 DIAGNOSIS — R69 Illness, unspecified: Secondary | ICD-10-CM | POA: Diagnosis not present

## 2018-07-30 DIAGNOSIS — N3281 Overactive bladder: Secondary | ICD-10-CM | POA: Diagnosis not present

## 2018-07-30 DIAGNOSIS — Z95810 Presence of automatic (implantable) cardiac defibrillator: Secondary | ICD-10-CM | POA: Diagnosis not present

## 2018-07-31 ENCOUNTER — Ambulatory Visit (AMBULATORY_SURGERY_CENTER): Payer: Medicare HMO | Admitting: Gastroenterology

## 2018-07-31 ENCOUNTER — Encounter: Payer: Self-pay | Admitting: Gastroenterology

## 2018-07-31 VITALS — BP 119/86 | HR 80 | Temp 96.2°F | Resp 17 | Ht 63.0 in | Wt 137.0 lb

## 2018-07-31 DIAGNOSIS — Z1211 Encounter for screening for malignant neoplasm of colon: Secondary | ICD-10-CM

## 2018-07-31 DIAGNOSIS — D123 Benign neoplasm of transverse colon: Secondary | ICD-10-CM | POA: Diagnosis not present

## 2018-07-31 DIAGNOSIS — Z86010 Personal history of colonic polyps: Secondary | ICD-10-CM | POA: Diagnosis not present

## 2018-07-31 DIAGNOSIS — D125 Benign neoplasm of sigmoid colon: Secondary | ICD-10-CM

## 2018-07-31 DIAGNOSIS — I10 Essential (primary) hypertension: Secondary | ICD-10-CM | POA: Diagnosis not present

## 2018-07-31 DIAGNOSIS — I429 Cardiomyopathy, unspecified: Secondary | ICD-10-CM | POA: Diagnosis not present

## 2018-07-31 DIAGNOSIS — D124 Benign neoplasm of descending colon: Secondary | ICD-10-CM

## 2018-07-31 DIAGNOSIS — D120 Benign neoplasm of cecum: Secondary | ICD-10-CM | POA: Diagnosis not present

## 2018-07-31 DIAGNOSIS — D122 Benign neoplasm of ascending colon: Secondary | ICD-10-CM

## 2018-07-31 MED ORDER — SODIUM CHLORIDE 0.9 % IV SOLN: 500.0000 mL | Freq: Once | INTRAVENOUS | Status: DC

## 2018-07-31 NOTE — Op Note (Signed)
[facility] Patient Name: [name] Procedure Date: 07/31/2018 11:13 AM MRN: [id number] Endoscopist: Ladene Artist , MD Age: 62 Referring MD:  Date of Birth: [birth year] Gender: Female Account #: [id number] Procedure:                Colonoscopy Indications:              Screening for colorectal malignant neoplasm Medicines:                Monitored Anesthesia Care Procedure:                Pre-Anesthesia Assessment:                           - Prior to the procedure, a History and Physical                            was performed, and patient medications and                            allergies were reviewed. The patient's tolerance of                            previous anesthesia was also reviewed. The risks                            and benefits of the procedure and the sedation                            options and risks were discussed with the patient.                            All questions were answered, and informed consent                            was obtained. Prior Anticoagulants: The patient has                            taken no previous anticoagulant or antiplatelet                            agents. ASA Grade Assessment: II - A patient with                            mild systemic disease. After reviewing the risks                            and benefits, the patient was deemed in                            satisfactory condition to undergo the procedure.                           After obtaining informed consent, the colonoscope  was passed under direct vision. Throughout the                            procedure, the patient's blood pressure, pulse, and                            oxygen saturations were monitored continuously. The                            Colonoscope was introduced through the anus and                            advanced to the the cecum, identified by                            appendiceal orifice and  ileocecal valve. The                            ileocecal valve, appendiceal orifice, and rectum                            were photographed. The quality of the bowel                            preparation was excellent. The colonoscopy was                            performed without difficulty. The patient tolerated                            the procedure well. Scope In: 11:23:57 AM Scope Out: 41:93:79 AM Scope Withdrawal Time: 0 hours 17 minutes 49 seconds  Total Procedure Duration: 0 hours 23 minutes 1 second  Findings:                 The perianal and digital rectal examinations were                            normal.                           Three sessile polyps were found in the sigmoid                            colon, descending colon and ascending colon. The                            polyps were 6 to 8 mm in size. These polyps were                            removed with a cold snare. Resection and retrieval                            were complete.  Five sessile polyps were found in the transverse                            colon (2) and ileocecal valve (3). The polyps were                            3 to 4 mm in size. These polyps were removed with a                            cold biopsy forceps. Resection and retrieval were                            complete.                           The exam was otherwise without abnormality on                            direct and retroflexion views. Complications:            No immediate complications. Estimated blood loss:                            None. Estimated Blood Loss:     Estimated blood loss: none. Impression:               - Three 6 to 8 mm polyps in the sigmoid colon, in                            the descending colon and in the ascending colon,                            removed with a cold snare. Resected and retrieved.                           - Five 3 to 4 mm polyps in the transverse  colon and                            at the ileocecal valve, removed with a cold biopsy                            forceps. Resected and retrieved.                           - The examination was otherwise normal on direct                            and retroflexion views. Recommendation:           - Repeat colonoscopy date to be determined after                            pending pathology results are reviewed for  surveillance.                           - Patient has a contact number available for                            emergencies. The signs and symptoms of potential                            delayed complications were discussed with the                            patient. Return to normal activities tomorrow.                            Written discharge instructions were provided to the                            patient.                           - Resume previous diet.                           - Continue present medications.                           - Await pathology results. Ladene Artist, MD 07/31/2018 11:55:00 AM This report has been signed electronically.

## 2018-07-31 NOTE — Progress Notes (Signed)
To recovery, report to RN, VSS. 

## 2018-07-31 NOTE — Patient Instructions (Signed)
YOU HAD AN ENDOSCOPIC PROCEDURE TODAY AT [facility]:   Refer to the procedure report that was given to you for any specific questions about what was found during the examination.  If the procedure report does not answer your questions, please call your gastroenterologist to clarify.  If you requested that your care partner not be given the details of your procedure findings, then the procedure report has been included in a sealed envelope for you to review at your convenience later.  YOU SHOULD EXPECT: Some feelings of bloating in the abdomen. Passage of more gas than usual.  Walking can help get rid of the air that was put into your GI tract during the procedure and reduce the bloating. If you had a lower endoscopy (such as a colonoscopy or flexible sigmoidoscopy) you may notice spotting of blood in your stool or on the toilet paper. If you underwent a bowel prep for your procedure, you may not have a normal bowel movement for a few days.  Please Note:  You might notice some irritation and congestion in your nose or some drainage.  This is from the oxygen used during your procedure.  There is no need for concern and it should clear up in a day or so.  SYMPTOMS TO REPORT IMMEDIATELY:   Following lower endoscopy (colonoscopy or flexible sigmoidoscopy):  Excessive amounts of blood in the stool  Significant tenderness or worsening of abdominal pains  Swelling of the abdomen that is new, acute  Fever of 100F or higher  Please see handouts given to you on Polyps.  For urgent or emergent issues, a gastroenterologist can be reached at any hour by calling [phone].   DIET:  We do recommend a small meal at first, but then you may proceed to your regular diet.  Drink plenty of fluids but you should avoid alcoholic beverages for 24 hours.  ACTIVITY:  You should plan to take it easy for the rest of today and you should NOT DRIVE or use heavy machinery until tomorrow (because of the  sedation medicines used during the test).    FOLLOW UP: Our staff will call the number listed on your records the next business day following your procedure to check on you and address any questions or concerns that you may have regarding the information given to you following your procedure. If we do not reach you, we will leave a message.  However, if you are feeling well and you are not experiencing any problems, there is no need to return our call.  We will assume that you have returned to your regular daily activities without incident.  If any biopsies were taken you will be contacted by phone or by letter within the next 1-3 weeks.  Please call us at [phone] if you have not heard about the biopsies in 3 weeks.    SIGNATURES/CONFIDENTIALITY: You and/or your care partner have signed paperwork which will be entered into your electronic medical record.  These signatures attest to the fact that that the information above on your After Visit Summary has been reviewed and is understood.  Full responsibility of the confidentiality of this discharge information lies with you and/or your care-partner.  Thank you for letting us take care of your healthcare needs today.

## 2018-07-31 NOTE — Progress Notes (Signed)
Pt's states no medical or surgical changes since previsit or office visit. 

## 2018-07-31 NOTE — Progress Notes (Signed)
Called to room to assist during endoscopic procedure.  Patient ID and intended procedure confirmed with present staff. Received instructions for my participation in the procedure from the performing physician.  

## 2018-08-01 ENCOUNTER — Telehealth: Payer: Self-pay

## 2018-08-01 NOTE — Telephone Encounter (Signed)
  Follow up Call-  Call back number 07/31/2018 07/08/2018 07/08/2018  Post procedure Call Back phone  # [phone]  Permission to leave phone message Yes Yes Yes  Some recent data might be hidden     Patient questions:  Do you have a fever, pain , or abdominal swelling? No. Pain Score  0 *  Have you tolerated food without any problems? Yes.    Have you been able to return to your normal activities? Yes.    Do you have any questions about your discharge instructions: Diet   No. Medications  No. Follow up visit  No.  Do you have questions or concerns about your Care? No.  Actions: * If pain score is 4 or above: No action needed, pain <4.

## 2018-08-05 DIAGNOSIS — I110 Hypertensive heart disease with heart failure: Secondary | ICD-10-CM | POA: Diagnosis not present

## 2018-08-05 DIAGNOSIS — R69 Illness, unspecified: Secondary | ICD-10-CM | POA: Diagnosis not present

## 2018-08-05 DIAGNOSIS — I429 Cardiomyopathy, unspecified: Secondary | ICD-10-CM | POA: Diagnosis not present

## 2018-08-05 DIAGNOSIS — Z95810 Presence of automatic (implantable) cardiac defibrillator: Secondary | ICD-10-CM | POA: Diagnosis not present

## 2018-08-05 DIAGNOSIS — N3281 Overactive bladder: Secondary | ICD-10-CM | POA: Diagnosis not present

## 2018-08-05 DIAGNOSIS — E7800 Pure hypercholesterolemia, unspecified: Secondary | ICD-10-CM | POA: Diagnosis not present

## 2018-08-05 DIAGNOSIS — I5022 Chronic systolic (congestive) heart failure: Secondary | ICD-10-CM | POA: Diagnosis not present

## 2018-08-05 DIAGNOSIS — Z7982 Long term (current) use of aspirin: Secondary | ICD-10-CM | POA: Diagnosis not present

## 2018-08-05 DIAGNOSIS — N393 Stress incontinence (female) (male): Secondary | ICD-10-CM | POA: Diagnosis not present

## 2018-08-07 ENCOUNTER — Telehealth: Payer: Self-pay | Admitting: Neurology

## 2018-08-07 NOTE — Telephone Encounter (Signed)
Dr Bailar is leaving our office to take a job closer to where she lives. We have mailed a letter to patient to let her know we have canceled all appointments with Bailar. Thank you for your understanding °

## 2018-08-09 DIAGNOSIS — Z95810 Presence of automatic (implantable) cardiac defibrillator: Secondary | ICD-10-CM | POA: Diagnosis not present

## 2018-08-09 DIAGNOSIS — Z7982 Long term (current) use of aspirin: Secondary | ICD-10-CM | POA: Diagnosis not present

## 2018-08-09 DIAGNOSIS — N3281 Overactive bladder: Secondary | ICD-10-CM | POA: Diagnosis not present

## 2018-08-09 DIAGNOSIS — I110 Hypertensive heart disease with heart failure: Secondary | ICD-10-CM | POA: Diagnosis not present

## 2018-08-09 DIAGNOSIS — E7800 Pure hypercholesterolemia, unspecified: Secondary | ICD-10-CM | POA: Diagnosis not present

## 2018-08-09 DIAGNOSIS — N393 Stress incontinence (female) (male): Secondary | ICD-10-CM | POA: Diagnosis not present

## 2018-08-09 DIAGNOSIS — R69 Illness, unspecified: Secondary | ICD-10-CM | POA: Diagnosis not present

## 2018-08-09 DIAGNOSIS — I5022 Chronic systolic (congestive) heart failure: Secondary | ICD-10-CM | POA: Diagnosis not present

## 2018-08-09 DIAGNOSIS — I429 Cardiomyopathy, unspecified: Secondary | ICD-10-CM | POA: Diagnosis not present

## 2018-08-12 DIAGNOSIS — R69 Illness, unspecified: Secondary | ICD-10-CM | POA: Diagnosis not present

## 2018-08-12 DIAGNOSIS — Z7982 Long term (current) use of aspirin: Secondary | ICD-10-CM | POA: Diagnosis not present

## 2018-08-12 DIAGNOSIS — Z95810 Presence of automatic (implantable) cardiac defibrillator: Secondary | ICD-10-CM | POA: Diagnosis not present

## 2018-08-12 DIAGNOSIS — I429 Cardiomyopathy, unspecified: Secondary | ICD-10-CM | POA: Diagnosis not present

## 2018-08-12 DIAGNOSIS — I5022 Chronic systolic (congestive) heart failure: Secondary | ICD-10-CM | POA: Diagnosis not present

## 2018-08-12 DIAGNOSIS — I110 Hypertensive heart disease with heart failure: Secondary | ICD-10-CM | POA: Diagnosis not present

## 2018-08-12 DIAGNOSIS — E7800 Pure hypercholesterolemia, unspecified: Secondary | ICD-10-CM | POA: Diagnosis not present

## 2018-08-12 DIAGNOSIS — N3281 Overactive bladder: Secondary | ICD-10-CM | POA: Diagnosis not present

## 2018-08-12 DIAGNOSIS — N393 Stress incontinence (female) (male): Secondary | ICD-10-CM | POA: Diagnosis not present

## 2018-08-13 DIAGNOSIS — I5022 Chronic systolic (congestive) heart failure: Secondary | ICD-10-CM | POA: Diagnosis not present

## 2018-08-13 DIAGNOSIS — I429 Cardiomyopathy, unspecified: Secondary | ICD-10-CM | POA: Diagnosis not present

## 2018-08-13 DIAGNOSIS — R69 Illness, unspecified: Secondary | ICD-10-CM | POA: Diagnosis not present

## 2018-08-16 DIAGNOSIS — E7800 Pure hypercholesterolemia, unspecified: Secondary | ICD-10-CM | POA: Diagnosis not present

## 2018-08-16 DIAGNOSIS — Z95810 Presence of automatic (implantable) cardiac defibrillator: Secondary | ICD-10-CM | POA: Diagnosis not present

## 2018-08-16 DIAGNOSIS — I429 Cardiomyopathy, unspecified: Secondary | ICD-10-CM | POA: Diagnosis not present

## 2018-08-16 DIAGNOSIS — N3281 Overactive bladder: Secondary | ICD-10-CM | POA: Diagnosis not present

## 2018-08-16 DIAGNOSIS — N393 Stress incontinence (female) (male): Secondary | ICD-10-CM | POA: Diagnosis not present

## 2018-08-16 DIAGNOSIS — I5022 Chronic systolic (congestive) heart failure: Secondary | ICD-10-CM | POA: Diagnosis not present

## 2018-08-16 DIAGNOSIS — R69 Illness, unspecified: Secondary | ICD-10-CM | POA: Diagnosis not present

## 2018-08-16 DIAGNOSIS — I110 Hypertensive heart disease with heart failure: Secondary | ICD-10-CM | POA: Diagnosis not present

## 2018-08-16 DIAGNOSIS — Z7982 Long term (current) use of aspirin: Secondary | ICD-10-CM | POA: Diagnosis not present

## 2018-08-19 DIAGNOSIS — E7800 Pure hypercholesterolemia, unspecified: Secondary | ICD-10-CM | POA: Diagnosis not present

## 2018-08-19 DIAGNOSIS — I429 Cardiomyopathy, unspecified: Secondary | ICD-10-CM | POA: Diagnosis not present

## 2018-08-19 DIAGNOSIS — N3281 Overactive bladder: Secondary | ICD-10-CM | POA: Diagnosis not present

## 2018-08-19 DIAGNOSIS — Z95810 Presence of automatic (implantable) cardiac defibrillator: Secondary | ICD-10-CM | POA: Diagnosis not present

## 2018-08-19 DIAGNOSIS — I110 Hypertensive heart disease with heart failure: Secondary | ICD-10-CM | POA: Diagnosis not present

## 2018-08-19 DIAGNOSIS — N393 Stress incontinence (female) (male): Secondary | ICD-10-CM | POA: Diagnosis not present

## 2018-08-19 DIAGNOSIS — I5022 Chronic systolic (congestive) heart failure: Secondary | ICD-10-CM | POA: Diagnosis not present

## 2018-08-19 DIAGNOSIS — Z7982 Long term (current) use of aspirin: Secondary | ICD-10-CM | POA: Diagnosis not present

## 2018-08-19 DIAGNOSIS — R69 Illness, unspecified: Secondary | ICD-10-CM | POA: Diagnosis not present

## 2018-08-20 ENCOUNTER — Encounter: Payer: Self-pay | Admitting: Gastroenterology

## 2018-08-30 DIAGNOSIS — R69 Illness, unspecified: Secondary | ICD-10-CM | POA: Diagnosis not present

## 2018-08-30 DIAGNOSIS — I110 Hypertensive heart disease with heart failure: Secondary | ICD-10-CM | POA: Diagnosis not present

## 2018-08-30 DIAGNOSIS — Z95810 Presence of automatic (implantable) cardiac defibrillator: Secondary | ICD-10-CM | POA: Diagnosis not present

## 2018-08-30 DIAGNOSIS — N393 Stress incontinence (female) (male): Secondary | ICD-10-CM | POA: Diagnosis not present

## 2018-08-30 DIAGNOSIS — E7800 Pure hypercholesterolemia, unspecified: Secondary | ICD-10-CM | POA: Diagnosis not present

## 2018-08-30 DIAGNOSIS — N3281 Overactive bladder: Secondary | ICD-10-CM | POA: Diagnosis not present

## 2018-08-30 DIAGNOSIS — I5022 Chronic systolic (congestive) heart failure: Secondary | ICD-10-CM | POA: Diagnosis not present

## 2018-08-30 DIAGNOSIS — I429 Cardiomyopathy, unspecified: Secondary | ICD-10-CM | POA: Diagnosis not present

## 2018-08-30 DIAGNOSIS — Z7982 Long term (current) use of aspirin: Secondary | ICD-10-CM | POA: Diagnosis not present

## 2018-09-02 ENCOUNTER — Telehealth: Payer: Self-pay | Admitting: Neurology

## 2018-09-02 NOTE — Telephone Encounter (Signed)
Left message with son regarding letter that was mailed (Dr. Si Raider leaving). Explained that we would be happy to refer patient to another neuropsychologist ([facility] or [facility] in [place]).  Asked to call us if wanting to proceed with referral of if had any questions.

## 2018-09-04 DIAGNOSIS — N393 Stress incontinence (female) (male): Secondary | ICD-10-CM | POA: Diagnosis not present

## 2018-09-04 DIAGNOSIS — R69 Illness, unspecified: Secondary | ICD-10-CM | POA: Diagnosis not present

## 2018-09-04 DIAGNOSIS — I429 Cardiomyopathy, unspecified: Secondary | ICD-10-CM | POA: Diagnosis not present

## 2018-09-04 DIAGNOSIS — N3281 Overactive bladder: Secondary | ICD-10-CM | POA: Diagnosis not present

## 2018-09-04 DIAGNOSIS — E7800 Pure hypercholesterolemia, unspecified: Secondary | ICD-10-CM | POA: Diagnosis not present

## 2018-09-04 DIAGNOSIS — Z7982 Long term (current) use of aspirin: Secondary | ICD-10-CM | POA: Diagnosis not present

## 2018-09-04 DIAGNOSIS — I5022 Chronic systolic (congestive) heart failure: Secondary | ICD-10-CM | POA: Diagnosis not present

## 2018-09-04 DIAGNOSIS — I110 Hypertensive heart disease with heart failure: Secondary | ICD-10-CM | POA: Diagnosis not present

## 2018-09-04 DIAGNOSIS — Z95810 Presence of automatic (implantable) cardiac defibrillator: Secondary | ICD-10-CM | POA: Diagnosis not present

## 2018-09-11 DIAGNOSIS — N3281 Overactive bladder: Secondary | ICD-10-CM | POA: Diagnosis not present

## 2018-09-11 DIAGNOSIS — I110 Hypertensive heart disease with heart failure: Secondary | ICD-10-CM | POA: Diagnosis not present

## 2018-09-11 DIAGNOSIS — I429 Cardiomyopathy, unspecified: Secondary | ICD-10-CM | POA: Diagnosis not present

## 2018-09-11 DIAGNOSIS — N393 Stress incontinence (female) (male): Secondary | ICD-10-CM | POA: Diagnosis not present

## 2018-09-11 DIAGNOSIS — E7800 Pure hypercholesterolemia, unspecified: Secondary | ICD-10-CM | POA: Diagnosis not present

## 2018-09-11 DIAGNOSIS — R69 Illness, unspecified: Secondary | ICD-10-CM | POA: Diagnosis not present

## 2018-09-11 DIAGNOSIS — Z7982 Long term (current) use of aspirin: Secondary | ICD-10-CM | POA: Diagnosis not present

## 2018-09-11 DIAGNOSIS — Z95810 Presence of automatic (implantable) cardiac defibrillator: Secondary | ICD-10-CM | POA: Diagnosis not present

## 2018-09-11 DIAGNOSIS — I5022 Chronic systolic (congestive) heart failure: Secondary | ICD-10-CM | POA: Diagnosis not present

## 2018-09-23 DIAGNOSIS — R69 Illness, unspecified: Secondary | ICD-10-CM | POA: Diagnosis not present

## 2018-09-23 DIAGNOSIS — I5022 Chronic systolic (congestive) heart failure: Secondary | ICD-10-CM | POA: Diagnosis not present

## 2018-09-23 DIAGNOSIS — I429 Cardiomyopathy, unspecified: Secondary | ICD-10-CM | POA: Diagnosis not present

## 2018-09-23 DIAGNOSIS — Z95810 Presence of automatic (implantable) cardiac defibrillator: Secondary | ICD-10-CM | POA: Diagnosis not present

## 2018-09-23 DIAGNOSIS — N393 Stress incontinence (female) (male): Secondary | ICD-10-CM | POA: Diagnosis not present

## 2018-09-23 DIAGNOSIS — I110 Hypertensive heart disease with heart failure: Secondary | ICD-10-CM | POA: Diagnosis not present

## 2018-09-23 DIAGNOSIS — N3281 Overactive bladder: Secondary | ICD-10-CM | POA: Diagnosis not present

## 2018-09-23 DIAGNOSIS — E7800 Pure hypercholesterolemia, unspecified: Secondary | ICD-10-CM | POA: Diagnosis not present

## 2018-09-23 DIAGNOSIS — Z7982 Long term (current) use of aspirin: Secondary | ICD-10-CM | POA: Diagnosis not present

## 2018-10-02 ENCOUNTER — Ambulatory Visit (INDEPENDENT_AMBULATORY_CARE_PROVIDER_SITE_OTHER): Payer: Medicare HMO

## 2018-10-02 DIAGNOSIS — I428 Other cardiomyopathies: Secondary | ICD-10-CM | POA: Diagnosis not present

## 2018-10-03 LAB — CUP PACEART REMOTE DEVICE CHECK
Date Time Interrogation Session: 20200109193632
Implantable Lead Implant Date: 20060630
Implantable Lead Implant Date: 20060630
Implantable Lead Implant Date: 20060630
Implantable Lead Location: 753858
Implantable Lead Location: 753859
Implantable Lead Location: 753860
Implantable Lead Model: 157
Implantable Lead Model: 4086
Implantable Lead Model: 4543
Implantable Lead Serial Number: 119717
Implantable Lead Serial Number: 131677
Implantable Lead Serial Number: 223436
Implantable Pulse Generator Implant Date: 20120131
Pulse Gen Serial Number: 498164

## 2018-10-03 NOTE — Progress Notes (Signed)
Remote ICD transmission.   

## 2018-10-14 ENCOUNTER — Other Ambulatory Visit: Payer: Self-pay | Admitting: Family Medicine

## 2018-10-14 DIAGNOSIS — R69 Illness, unspecified: Secondary | ICD-10-CM | POA: Diagnosis not present

## 2018-10-14 DIAGNOSIS — Z23 Encounter for immunization: Secondary | ICD-10-CM | POA: Diagnosis not present

## 2018-10-14 DIAGNOSIS — E7800 Pure hypercholesterolemia, unspecified: Secondary | ICD-10-CM | POA: Diagnosis not present

## 2018-10-14 DIAGNOSIS — Z1231 Encounter for screening mammogram for malignant neoplasm of breast: Secondary | ICD-10-CM

## 2018-10-14 DIAGNOSIS — Z0001 Encounter for general adult medical examination with abnormal findings: Secondary | ICD-10-CM | POA: Diagnosis not present

## 2018-10-14 DIAGNOSIS — Z79899 Other long term (current) drug therapy: Secondary | ICD-10-CM | POA: Diagnosis not present

## 2018-10-14 DIAGNOSIS — D696 Thrombocytopenia, unspecified: Secondary | ICD-10-CM | POA: Diagnosis not present

## 2018-10-14 DIAGNOSIS — I5022 Chronic systolic (congestive) heart failure: Secondary | ICD-10-CM | POA: Diagnosis not present

## 2018-10-14 DIAGNOSIS — G20 Parkinson's disease: Secondary | ICD-10-CM | POA: Diagnosis not present

## 2018-10-14 DIAGNOSIS — I10 Essential (primary) hypertension: Secondary | ICD-10-CM | POA: Diagnosis not present

## 2018-11-06 ENCOUNTER — Ambulatory Visit (INDEPENDENT_AMBULATORY_CARE_PROVIDER_SITE_OTHER): Payer: Medicare HMO | Admitting: Neurology

## 2018-11-06 ENCOUNTER — Other Ambulatory Visit: Payer: Self-pay

## 2018-11-06 ENCOUNTER — Encounter: Payer: Self-pay | Admitting: Neurology

## 2018-11-06 VITALS — BP 184/122 | HR 80 | Ht 63.0 in | Wt 149.0 lb

## 2018-11-06 DIAGNOSIS — F03A0 Unspecified dementia, mild, without behavioral disturbance, psychotic disturbance, mood disturbance, and anxiety: Secondary | ICD-10-CM

## 2018-11-06 DIAGNOSIS — F0390 Unspecified dementia without behavioral disturbance: Secondary | ICD-10-CM

## 2018-11-06 DIAGNOSIS — G20 Parkinson's disease: Secondary | ICD-10-CM | POA: Diagnosis not present

## 2018-11-06 MED ORDER — CARBIDOPA-LEVODOPA 25-100 MG PO TABS: ORAL_TABLET | ORAL | 11 refills | Status: DC

## 2018-11-06 MED ORDER — DONEPEZIL HCL 10 MG PO TABS: ORAL_TABLET | ORAL | 11 refills | Status: DC

## 2018-11-06 NOTE — Progress Notes (Signed)
NEUROLOGY FOLLOW UP OFFICE NOTE  [name] [id number] 08-26-1956  HISTORY OF PRESENT ILLNESS: I had the pleasure of seeing [name] in follow-up in the neurology clinic on 11/06/2018. She is alone in the office today, her daughter-in-law dropped her off. The patient was last seen 4 months ago for mild dementia. MMSE 24/30 in October 2019. She was also noted to have slowed responses and bradykinesia, no other parkinsonian signs. We discussed starting Sinemet on her last visit, she has been taking it BID with meals. She appears less bradykinetic today with less hypomimia. She kept missing the noon dose of Sinemet, and felt that when she stopped it, her BP has gone up. It is 184/122 today, she is asymptomatic. She denies any missed bills. She denies missing her medications, then later on admits to forgetting her medications, she reports she has so many stickers around the house but still does not keep up. She was instructed to stop driving on her last visit, her family mostly drives her but she reports driving short distances without difficulties. She states she lives alone, then later states that her son stays with her at night. She denies any headaches, dizziness, vision changes, focal numbness/tingling/weakness, no falls.   History on Initial Assessment 03/26/2018: This is a pleasant 63 year old right-handed woman with a history of hypertension, hyperlipidemia, cardiomyopathy s/p ICD placement, severe aortic stenosis s/p AVR, presenting for evaluation of concern for memory changes. She states her memory on a scale of 1 to 5 is a 2.5. She noticed changes herself only recently, she feels like changes started since her birthday last May. She lives alone and denies getting lost driving, but states it is time for her to stop driving because she has no upper body strength. She has noticed she makes wide turns. She denies any missed bills or medications. She loses things a lot at home. She denies leaving  the stove on. No word-finding difficulties. She is a retired Financial planner. She has not noticed any speech changes. She endorses feeling depressed a lot this year, there have been several deaths she does not think she has dealt with well. She is taking Zoloft 50mg  daily without side effects but has not noticed much difference. Sleep is good. Her appetite is good but she can go without eating for a while. Her son and brother live locally but she does not see them frequently. She used to walk a lot and go to church regularly but stopped doing these last year.   She has numbness in both feet. She has had bladder leakage for a year. She has noticed severe constipation for the past 6 months. She denies any headaches, dizziness, diplopia, dysarthria/dysphagia, neck/back pain, anosmia, or tremors. No falls. No family history of dementia. She denies any history of significant head injuries, no alcohol use.   Diagnostic Data: TSH and B12 normal. I personally reviewed head CT without contrast done 06/14/18 which did not show any acute changes, there was mild diffuse atrophy and moderate chronic microvascular disease, atherosclerotic calcification of major vessels at the base of the brain, old left basal ganglia lacunar infarct (unable to do MRI due to ICD). Laboratory Data:  Lab Results  Component Value Date   TSH 1.33 03/26/2018   Lab Results  Component Value Date   VITAMINB12 1,505 (H) 03/26/2018    PAST MEDICAL HISTORY: Past Medical History:  Diagnosis Date  . Abnormal liver function tests   . AICD (automatic cardioverter/defibrillator) present  [employer]  . Anxiety   . Aortic stenosis   . Benign neoplasm of colon 04/19/2001   Hyperplastic  . Bicuspid aortic valve   . Cardiomyopathy secondary   . CHF (congestive heart failure) ([place])   . Coronary artery disease   . Depression   . Headache   . Heart murmur   . HTN (hypertension)   . Hypercalcemia   .  Hyperlipidemia   . Memory changes 06/2018   in the last year /per the brother  . S/P ICD (internal cardiac defibrillator) procedure 2018    MEDICATIONS: Current Outpatient Medications on File Prior to Visit  Medication Sig Dispense Refill  . acetaminophen (TYLENOL) 325 MG tablet Take 325 mg by mouth every 6 (six) hours as needed for mild pain.    Marland Kitchen aspirin EC 81 MG tablet Take 1 tablet (81 mg total) daily by mouth. 90 tablet 3  . bisacodyl (DULCOLAX) 5 MG EC tablet Take 5 mg by mouth. Dulcolax 5 mg bowel prep #4-Take as directed    . carbidopa-levodopa (SINEMET IR) 25-100 MG tablet Take 1/2 tablet three times a day with meals for a week, then increase to 1 tablet three times a day with meals 90 tablet 11  . carvedilol (COREG) 25 MG tablet Take 25 mg by mouth 2 (two) times daily with a meal.     . furosemide (LASIX) 40 MG tablet TAKE 1 TABLET BY MOUTH ONCE DAILY FOR 5 DAYS THEN GO DOWN TO 1 TABLET BY MOUTH EVERY OTHER DAY 90 tablet 1  . Multiple Vitamin (MULTIVITAMIN WITH MINERALS) TABS tablet Take 1 tablet by mouth at bedtime.    Marland Kitchen olmesartan (BENICAR) 40 MG tablet Take 0.5 tablets (20 mg total) by mouth daily. (Patient taking differently: Take 40 mg by mouth daily. ) 45 tablet 3  . Polyethyl Glycol-Propyl Glycol (SYSTANE OP) Place 1-2 drops into both eyes 3 (three) times daily as needed (for dry eyes.).    Marland Kitchen polyethylene glycol (MIRALAX / GLYCOLAX) packet Take 17 g by mouth daily. (Patient taking differently: Take 17 g by mouth as needed. ) 14 each 0  . polyethylene glycol (MIRALAX / GLYCOLAX) packet Take 17 g by mouth. Miralax 119 gm bowel prep-Take as directed    . rosuvastatin (CRESTOR) 10 MG tablet Take 1 tablet (10 mg total) by mouth daily. 90 tablet 3  . sertraline (ZOLOFT) 100 MG tablet Take 100 mg by mouth daily.      No current facility-administered medications on file prior to visit.     ALLERGIES: Allergies  Allergen Reactions  . Trospium Chloride Er Other (See Comments)     Constipation     FAMILY HISTORY: Family History  Problem Relation Age of Onset  . Pancreatic cancer Mother   . Colon cancer Maternal Aunt   . Bone cancer Brother   . Heart attack Brother   . Hypercalcemia Neg Hx     SOCIAL HISTORY: Social History   Socioeconomic History  . Marital status: Single    Spouse name: Not on file  . Number of children: 1  . Years of education: Not on file  . Highest education level: Not on file  Occupational History  . Occupation: Geophysicist/field seismologist: UNEMPLOYED  Social Needs  . Financial resource strain: Not on file  . Food insecurity:    Worry: Not on file    Inability: Not on file  . Transportation needs:    Medical: Not on file  Non-medical: Not on file  Tobacco Use  . Smoking status: Former Smoker    Packs/day: 0.25    Years: 40.00    Pack years: 10.00    Types: Cigarettes    Last attempt to quit: 04/2018    Years since quitting: 0.5  . Smokeless tobacco: Never Used  Substance and Sexual Activity  . Alcohol use: Yes    Alcohol/week: 0.0 standard drinks    Comment: occasional  . Drug use: No  . Sexual activity: Not on file  Lifestyle  . Physical activity:    Days per week: Not on file    Minutes per session: Not on file  . Stress: Not on file  Relationships  . Social connections:    Talks on phone: Not on file    Gets together: Not on file    Attends religious service: Not on file    Active member of club or organization: Not on file    Attends meetings of clubs or organizations: Not on file    Relationship status: Not on file  . Intimate partner violence:    Fear of current or ex partner: Not on file    Emotionally abused: Not on file    Physically abused: Not on file    Forced sexual activity: Not on file  Other Topics Concern  . Not on file  Social History Narrative  . Not on file    REVIEW OF SYSTEMS: Constitutional: No fevers, chills, or sweats, no generalized fatigue, change in appetite Eyes:  No visual changes, double vision, eye pain Ear, nose and throat: No hearing loss, ear pain, nasal congestion, sore throat Cardiovascular: No chest pain, palpitations Respiratory:  No shortness of breath at rest or with exertion, wheezes GastrointestinaI: No nausea, vomiting, diarrhea, abdominal pain, fecal incontinence Genitourinary:  No dysuria, urinary retention or frequency Musculoskeletal:  No neck pain, back pain Integumentary: No rash, pruritus, skin lesions Neurological: as above Psychiatric: No depression, insomnia, anxiety Endocrine: No palpitations, fatigue, diaphoresis, mood swings, change in appetite, change in weight, increased thirst Hematologic/Lymphatic:  No anemia, purpura, petechiae. Allergic/Immunologic: no itchy/runny eyes, nasal congestion, recent allergic reactions, rashes  PHYSICAL EXAM: Vitals:   11/06/18 1359  BP: (!) 184/122  Pulse: 80  SpO2: 97%   General: No acute distress, decreased eye blink rate but appears to have less hypomimia today Head:  Normocephalic/atraumatic Neck: supple, no paraspinal tenderness, full range of motion Heart:  Regular rate and rhythm Lungs:  Clear to auscultation bilaterally Back: No paraspinal tenderness Skin/Extremities: No rash, no edema Neurological Exam: alert and oriented to person, place, and time. No aphasia or dysarthria. Fund of knowledge is appropriate.  Recent and remote memory are impaired.  Attention and concentration are reduced.  Able to name objects and repeat phrases. Montreal Cognitive Assessment  11/06/2018 03/26/2018  Visuospatial/ Executive (0/5) 1 3  Naming (0/3) 3 3  Attention: Read list of digits (0/2) 1 2  Attention: Read list of letters (0/1) 0 0  Attention: Serial 7 subtraction starting at 100 (0/3) 0 0  Language: Repeat phrase (0/2) 1 2  Language : Fluency (0/1) 0 0  Abstraction (0/2) 1 2  Delayed Recall (0/5) 1 2  Orientation (0/6) 5 5  Total 13 19  Adjusted Score (based on education) 14 -    Cranial nerves: CN I: not tested CN II: pupils equal, round and reactive to light, visual fields intact CN III, IV, VI:  full range of motion, no nystagmus, no ptosis, mild  exophthalmos CN V: facial sensation intact CN VII: upper and lower face symmetric CN VIII: hearing intact to finger rub CN IX, X: gag intact, uvula midline CN XI: sternocleidomastoid and trapezius muscles intact CN XII: tongue midline Bulk & Tone: +cogwheeling bilaterally with bradykinesia, decreased finger and foot taps, no fasciculations (similar to prior). Motor: 5/5 throughout with no pronator drift. Sensation: intact to light touch.  Romberg test negative Deep Tendon Reflexes: brisk +2 throughout, no ankle clonus Plantar responses: downgoing bilaterally Cerebellar: no incoordination on finger to nose testing Gait: narrow-based and steady, able to tandem walk adequately, good arm swing bilaterally Tremor: none Negative Pull test today  IMPRESSION: This is a pleasant 63 yo RH woman with a history of  hypertension, hyperlipidemia, cardiomyopathy s/p ICD placement, severe aortic stenosis s/p AVR, with mild dementia. MOCA score today 14/30 (19/30 in July 2019). She is alone in the office today with no family to corroborate history. There may have been some improvement in bradykinesia with the Sinemet, continue Sinemet 25/100mg  BID. We discussed medications used in dementia, she is agreeable to starting Donepezil 5mg  daily for 2 weeks, then increase to 10mg  daily. Side effects and expectations from the medication were discussed. We discussed Advanced Care Planning and appointing a power of attorney, we also discussed looking into assisted living facilities, information provided today. We again discussed no further driving. She will discuss elevated BP with her PCP. Follow-up in 6 months, she knows to call for any changes.   Thank you for allowing me to participate in her care.  Please do not hesitate to call for any  questions or concerns.  The duration of this appointment visit was 30 minutes of face-to-face time with the patient.  Greater than 50% of this time was spent in counseling, explanation of diagnosis, planning of further management, and coordination of care.   Ellouise Newer, M.D.   CC: Dr. Dorthy Cooler

## 2018-11-06 NOTE — Patient Instructions (Addendum)
1. Start Donepezil 10mg : Take 1/2 tablet daily for 2 weeks, then increase to 1 tablet daily  2. Continue Sinemet 25/100mg : Take 1 tablet at breakfast, then 1 tablet at dinner  3. Recommend that we start looking into Assisted Living Facilities for more assistance  4. Recommend appointing a power of attorney  5. No further driving  5. Speak to your PCP about the high blood pressure  7. Follow-up in 6 months, call for any changes  FALL PRECAUTIONS: Be cautious when walking. Scan the area for obstacles that may increase the risk of trips and falls. When getting up in the mornings, sit up at the edge of the bed for a few minutes before getting out of bed. Consider elevating the bed at the head end to avoid drop of blood pressure when getting up. Walk always in a well-lit room (use night lights in the walls). Avoid area rugs or power cords from appliances in the middle of the walkways. Use a walker or a cane if necessary and consider physical therapy for balance exercise. Get your eyesight checked regularly.  FINANCIAL OVERSIGHT: Supervision, especially oversight when making financial decisions or transactions is also recommended.  HOME SAFETY: Consider the safety of the kitchen when operating appliances like stoves, microwave oven, and blender. Consider having supervision and share cooking responsibilities until no longer able to participate in those. Accidents with firearms and other hazards in the house should be identified and addressed as well.  DRIVING: Regarding driving, in patients with progressive memory problems, driving will be impaired. We advise to have someone else do the driving if trouble finding directions or if minor accidents are reported. Independent driving assessment is available to determine safety of driving.  ABILITY TO BE LEFT ALONE: If patient is unable to contact 911 operator, consider using LifeLine, or when the need is there, arrange for someone to stay with patients.  Smoking is a fire hazard, consider supervision or cessation. Risk of wandering should be assessed by caregiver and if detected at any point, supervision and safe proof recommendations should be instituted.  MEDICATION SUPERVISION: Inability to self-administer medication needs to be constantly addressed. Implement a mechanism to ensure safe administration of the medications.  RECOMMENDATIONS FOR ALL PATIENTS WITH MEMORY PROBLEMS: 1. Continue to exercise (Recommend 30 minutes of walking everyday, or 3 hours every week) 2. Increase social interactions - continue going to [place] and enjoy social gatherings with friends and family 3. Eat healthy, avoid fried foods and eat more fruits and vegetables 4. Maintain adequate blood pressure, blood sugar, and blood cholesterol level. Reducing the risk of stroke and cardiovascular disease also helps promoting better memory. 5. Avoid stressful situations. Live a simple life and avoid aggravations. Organize your time and prepare for the next day in anticipation. 6. Sleep well, avoid any interruptions of sleep and avoid any distractions in the bedroom that may interfere with adequate sleep quality 7. Avoid sugar, avoid sweets as there is a strong link between excessive sugar intake, diabetes, and cognitive impairment The Mediterranean diethas been shown to help patients reduce the risk of progressive memory disorders and reduces cardiovascular risk. This includes eating fish, eat fruits and green leafy vegetables, nuts like almonds and hazelnuts, walnuts, and also use olive oil. Avoid fast foods and fried foods as much as possible. Avoid sweets and sugar as sugar use has been linked to worsening of memory function.  There is always a concern of gradual progression of memory problems. If this is the case, then  we may need to adjust level of care according to patient needs. Support, both to the patient and caregiver, should then be put into place.

## 2018-11-08 ENCOUNTER — Ambulatory Visit
Admission: RE | Admit: 2018-11-08 | Discharge: 2018-11-08 | Disposition: A | Discharge status: Home / Self Care | Payer: Medicare HMO | Source: Ambulatory Visit | Attending: Family Medicine | Admitting: Family Medicine

## 2018-11-08 DIAGNOSIS — Z1231 Encounter for screening mammogram for malignant neoplasm of breast: Secondary | ICD-10-CM | POA: Diagnosis not present

## 2018-11-18 ENCOUNTER — Telehealth: Payer: Self-pay | Admitting: Neurology

## 2018-11-18 NOTE — Telephone Encounter (Signed)
Patient called and would like to speak with you regarding her last 2 medications that she was put on. She has some questions for you. Please Call. Thanks

## 2018-11-20 NOTE — Telephone Encounter (Signed)
Spoke with pt.  She states that there was an error on her Rx bottles.  States that she is suppose to take Donepezil BID.  I advised that no, she is only suppose to take it daily - half tab x2weeks, then 1 tab daily.   Pt seems to have gotten her directions for her Donepezil and Carbidopa-levodopa mixed up because she kept repeating the directions for one, but insisted that she was talking about the other.  I asked pt to read the directions on her bottles to me.  I advised that she take medications as stated on bottles.  Pt seemed confused by this but agreeable.  She stated that she was writing the directions on the bottles for clarification.  I advised that she not do that as she may confuse herself by reading what is hand written vs what is printed on bottle (the correct directions) but pt still wrote something on each bottle.  I do not know what

## 2018-11-28 ENCOUNTER — Telehealth: Payer: Self-pay | Admitting: Neurology

## 2018-11-28 NOTE — Telephone Encounter (Signed)
Forwarding to Dr. Delice Lesch as Juluis Rainier

## 2018-11-28 NOTE — Telephone Encounter (Signed)
She is also wanting to know about a referral for Neuro-testing. Please. Thanks!

## 2018-11-28 NOTE — Telephone Encounter (Signed)
Niece is calling in about medication-new med Donepezil? and she is wanting to know if Dr.Aquino is the prescribing Doctor for it. There are some things going on that she wants to go over about the symptoms. Thanks!

## 2018-11-28 NOTE — Telephone Encounter (Signed)
Spoke with pt's niece, Dewaine Oats.  She states that pt is experiencing diarrhea, and urinary urgency - sometimes not able to make it to the restroom.  Dewaine Oats asks if this is side effect of Aricept.  I advised that diarrhea is, and that that is why we prescribe it as half tab x2weeks before increasing to full tab.  I advised that last conversation with pt, pt argued that Dr. Delice Lesch had written Rx wrong and that she is to take Aricept BID.  I let Dewaine Oats know that pt did not seem to understand directions at end of conversation.  Dewaine Oats asks why Carbidopa-levodopa directions were changed from TID to BID.  I advised that pt stated that she never picked up original Rx, and Dr. Delice Lesch may have thought pt would be better compliant if BID.  (pls see previous 8 phone encounters)  Dewaine Oats states that she herself had picked up Rx months ago and that pt picked up Rx as well from different pharmacy and that pt has been taking Carbidopa-levodopa for many months now.  Dewaine Oats goes on to state that pt's son is not managing her medications nor really helping with anything in regards to pt.  I let yvette know that I would print copy of last AVS with all of Dr. Amparo Bristol current recommendations and place at front desk for her to pick up at her convenience.  During our conversation, Dewaine Oats called her father, pt's brother, on 3-way conversation.  I relayed to brother that Dr. Delice Lesch recommends pt seek assisted living, appoint a POA, and stop driving.  Pt's brother states that he did not know any of this and asks that I print copy of AVS for him as well.  I advised that I would get some other papers in order (Mainly dementia packet, list of local assisted living facilities, and Advance Directives) and place at front desk for pick up.  Brother and Corporate treasurer and state that Dewaine Oats will be by the office tomorrow morning.

## 2018-11-29 NOTE — Telephone Encounter (Signed)
[name] came to the office this AM to pick up paper work.  She contacted her father, Okey Dupre, who gave me verbal permission to speak with [name].  I went over Dr. Amparo Bristol recommendations with both [name] and Okey Dupre.  Went over Dementia packet with [name] as well as list of assisted living facilities and Advance Directive paperwork.  While [name] was in the office, pt's son Gerald Stabs called [name], and he was placed on speaker phone.  Gerald Stabs states that he is managing pt's medications.  I asked how he sets out pt's Donepezil.  Gerald Stabs states "one in the morning and one in the evening."  I advised that this medication is prescribed only once daily, and with her taking it BID this may be contributing to her recent GI issues.  Gerald Stabs states "well, I didn't know that.  Just went by what she (pt) said"  Gerald Stabs stated that he is on his way to the beach, and that he "made sure to set pt's medications out" before he left.  Plans on being gone 1-2 weeks.  I let Gerald Stabs know that Dr. Delice Lesch recommends that pt strongly consider assisted living facility.  Gerald Stabs stated "there's no way I will let her live in a place like that"  [name] became visibly upset and asked Gerald Stabs "are you going to cook 3 meals a day for her?  Are you going to hand her medications to her WHEN she needs them?  Are you going to stay there 23/7?"  After conversation with Eugenia Pancoast was visibly upset, crying asking "what do I do?"  I told her that if pt were one of my loved ones, I would start an APS case to gain control of the situation.  It was clear to me that pt's son is not concerned for pt's wellbeing, whereas [name] and Okey Dupre seem to have the pt's best interest in mind.

## 2018-11-29 NOTE — Telephone Encounter (Signed)
Thanks. Glad to hear they are involved, no one was with her on last visit. If they can bring her to [place] for Neuropsych testing, that would be great. Thanks

## 2018-12-13 ENCOUNTER — Other Ambulatory Visit: Payer: Self-pay

## 2018-12-13 DIAGNOSIS — G20 Parkinson's disease: Secondary | ICD-10-CM

## 2018-12-13 DIAGNOSIS — F03A0 Unspecified dementia, mild, without behavioral disturbance, psychotic disturbance, mood disturbance, and anxiety: Secondary | ICD-10-CM

## 2018-12-13 DIAGNOSIS — F0390 Unspecified dementia without behavioral disturbance: Secondary | ICD-10-CM

## 2018-12-13 DIAGNOSIS — R413 Other amnesia: Secondary | ICD-10-CM

## 2018-12-13 DIAGNOSIS — G3184 Mild cognitive impairment, so stated: Secondary | ICD-10-CM

## 2018-12-17 ENCOUNTER — Encounter: Payer: Medicare HMO | Admitting: Psychology

## 2019-01-01 ENCOUNTER — Ambulatory Visit (INDEPENDENT_AMBULATORY_CARE_PROVIDER_SITE_OTHER): Payer: Medicare HMO | Admitting: *Deleted

## 2019-01-01 ENCOUNTER — Other Ambulatory Visit: Payer: Self-pay

## 2019-01-01 ENCOUNTER — Telehealth: Payer: Self-pay

## 2019-01-01 DIAGNOSIS — I428 Other cardiomyopathies: Secondary | ICD-10-CM | POA: Diagnosis not present

## 2019-01-01 DIAGNOSIS — I5032 Chronic diastolic (congestive) heart failure: Secondary | ICD-10-CM

## 2019-01-01 LAB — CUP PACEART REMOTE DEVICE CHECK
Battery Remaining Longevity: 36 mo
Battery Remaining Percentage: 58 %
Brady Statistic RA Percent Paced: 0 %
Brady Statistic RV Percent Paced: 100 %
Date Time Interrogation Session: 20200408124255
HighPow Impedance: 59 Ohm
Implantable Lead Implant Date: 20060630
Implantable Lead Implant Date: 20060630
Implantable Lead Implant Date: 20060630
Implantable Lead Location: 753858
Implantable Lead Location: 753859
Implantable Lead Location: 753860
Implantable Lead Model: 157
Implantable Lead Model: 4086
Implantable Lead Model: 4543
Implantable Lead Serial Number: 119717
Implantable Lead Serial Number: 131677
Implantable Lead Serial Number: 223436
Implantable Pulse Generator Implant Date: 20120131
Lead Channel Impedance Value: 1427 Ohm
Lead Channel Impedance Value: 419 Ohm
Lead Channel Impedance Value: 543 Ohm
Lead Channel Pacing Threshold Amplitude: 1 V
Lead Channel Pacing Threshold Amplitude: 1.2 V
Lead Channel Pacing Threshold Amplitude: 1.2 V
Lead Channel Pacing Threshold Pulse Width: 0.4 ms
Lead Channel Pacing Threshold Pulse Width: 0.4 ms
Lead Channel Pacing Threshold Pulse Width: 0.4 ms
Lead Channel Setting Pacing Amplitude: 2 V
Lead Channel Setting Pacing Amplitude: 2.2 V
Lead Channel Setting Pacing Amplitude: 2.4 V
Lead Channel Setting Pacing Pulse Width: 0.4 ms
Lead Channel Setting Pacing Pulse Width: 0.4 ms
Lead Channel Setting Sensing Sensitivity: 0.5 mV
Lead Channel Setting Sensing Sensitivity: 1 mV
Pulse Gen Serial Number: 498164

## 2019-01-01 NOTE — Telephone Encounter (Signed)
Spoke with patient to remind of missed remote transmission 

## 2019-01-02 ENCOUNTER — Encounter: Payer: Medicare HMO | Admitting: Psychology

## 2019-01-10 NOTE — Progress Notes (Signed)
Remote ICD transmission.   

## 2019-01-15 ENCOUNTER — Telehealth: Payer: Self-pay | Admitting: Internal Medicine

## 2019-01-15 NOTE — Telephone Encounter (Signed)
New Message            Patient is calling in today to get some assistance with mychart. Pls call to advise

## 2019-01-15 NOTE — Telephone Encounter (Signed)
I spoke to [facility], our receptionist and informed her that patient needs a call pertaining to My Chart.

## 2019-01-29 DIAGNOSIS — M545 Low back pain: Secondary | ICD-10-CM | POA: Diagnosis not present

## 2019-01-29 DIAGNOSIS — I10 Essential (primary) hypertension: Secondary | ICD-10-CM | POA: Diagnosis not present

## 2019-01-29 DIAGNOSIS — F411 Generalized anxiety disorder: Secondary | ICD-10-CM | POA: Diagnosis not present

## 2019-01-30 ENCOUNTER — Telehealth: Payer: Self-pay | Admitting: Neurology

## 2019-01-30 NOTE — Telephone Encounter (Signed)
Patient left VM about needing to ask the nurse a question. Please call her back at [phone]. Thanks!

## 2019-01-30 NOTE — Telephone Encounter (Signed)
Pt called back no answer voice mail left

## 2019-01-30 NOTE — Telephone Encounter (Signed)
Pt wanted me to let you know that she went to see Dr. Zigmund Daniel her PCP for back pain he told her it was a pulled muscle, also she have not been able to be placed into a assistant living due to covid 45 also she isn't driving her son drives her car, he helps and takes care of her.

## 2019-01-30 NOTE — Telephone Encounter (Signed)
Patient called back regarding needing to speak with you regarding her visit she had with her PCP. Please Call. Thanks.

## 2019-02-11 DIAGNOSIS — M545 Low back pain: Secondary | ICD-10-CM | POA: Diagnosis not present

## 2019-02-12 ENCOUNTER — Other Ambulatory Visit: Payer: Self-pay | Admitting: *Deleted

## 2019-02-12 ENCOUNTER — Other Ambulatory Visit: Payer: Self-pay

## 2019-02-12 DIAGNOSIS — G20 Parkinson's disease: Secondary | ICD-10-CM

## 2019-02-12 DIAGNOSIS — F03A0 Unspecified dementia, mild, without behavioral disturbance, psychotic disturbance, mood disturbance, and anxiety: Secondary | ICD-10-CM

## 2019-02-12 DIAGNOSIS — G3184 Mild cognitive impairment, so stated: Secondary | ICD-10-CM

## 2019-02-12 DIAGNOSIS — Z7189 Other specified counseling: Secondary | ICD-10-CM

## 2019-02-12 DIAGNOSIS — R413 Other amnesia: Secondary | ICD-10-CM

## 2019-02-12 DIAGNOSIS — F0390 Unspecified dementia without behavioral disturbance: Secondary | ICD-10-CM

## 2019-02-12 NOTE — Patient Outreach (Addendum)
[place] [facility]) Care Management  02/12/2019  [name] 03/27/1956 [id number]   Subjective: Telephone call to patient's home / mobile number, spoke with patient, and HIPAA verified.  Discussed [place] health plan  referral follow up, patient voiced understanding, and is in agreement to follow up.  Patient states she is doing okay, had a phone visit with her primary MD on 02/11/2019, is planning to schedule a follow up visit with primary MD within the next 10 days to follow up on back pain, and is planning to call neurologist to schedule a follow up regarding her memory issue how it relates to phone call management.  States she is having a hard time managing her home phone (can not always remember how to get messages, how to answer the phone).  Patient states she is walking outside, doing some home exercises, sometimes has trouble sitting down in chair / on bed, sometimes loses balance while trying to sit down, will slide to the floor / fall,  and is planning to talk with neurologist regarding additional physical therapy. Patient states she would like education on hypertension, has a blood pressure cuff, has not taken blood pressure today, and is planning to take it later, has problems with blood pressure cuff at times, is in agreement to a referral to [place] for hypertension education and management.  Patient states she has congestive heart failure, weighs daily, but has not weighed yet today, has been losing weight, has lost 20 lbs within the last year, and MD is aware.  PHQ2  & 9 Depression Scale completed, patient states she has a history of depression, has discussed her feelings with primary MD, taking medication as directed, is experiencing  memory issue, is fatigued at times, is in agreement to a referral to [place] Social Worker to follow up on depression screening tool results and provide resources.  Patient states she has a  pill box that she fills daily, having some confusion with filling the pill box, may be having some effects to some of her medications, having increased urination, having some diarrhea, MD is aware of her concerns, and is in agreement to a referral to [place] for medication management.   Patient states she does not have any transition of care, or  transportation needs at this time.  States she is very appreciative of the follow up, is in agreement to receive [place] Management information, and services.  Telephone call to patient's neurologist office (Dr. Ellouise Newer) [phone]) times 2, spoke with [name], transferred to [facility] and gave verbal update on above conversation with patient.  [name] states she reviewed patient's chart, will follow up on 12/13/2018 referral sent by Dr. Delice Lesch for home health services, will discussed if outpatient neuro rehabilitation referral is appropriate, update Dr. Delice Lesch on our conversation, will  obtain new orders for home health services (physical and occupational therapy) if needed, and  if MD in agreement.   Discussed [facility] Care Management services are not home health services, patient will still be eligible to receive home health services, [name] voices understanding,and states she will follow up.     Objective: Per KPN (Knowledge Performance Now, point of care tool) and chart review, patient has not had any recent hospitalizations or ED visits.  Patient also has a history of hyperlipidemia, CAD, AICD (automatic cardioverter/defibrillator), Aortic stenosis,Bicuspid aortic valve,  Benign neoplasm of colon, Depression, hypertension, and Memory changes.  Assessment: Received [employer] health plan referral on 02/06/2019.  Referral source: [name] ( [phone] ext. [phone]).   Referral reason: Dementia, Limited support system, may need physical therapy.   Screening follow up completed, will refer patient to [place] for hypertension education / management, Pharmacy for medication management,  Social Worker to follow up on depression screening tool results, and provide resources.      Plan: RNCM will send patient successful outreach letter, [facility] pamphlet, magnet, and Advanced Directive packet per patient's request. RNCM will refer patient to [place] for hypertension education / management,. RNCM will refer patient to [place] for medication management. RNCM will refer patient to [place] Management Social Worker for follow up on depression screening tool results, and provide resources.       Amare Kontos H. Annia Friendly, BSN, [place] Management [facility] Telephonic CM Phone: [phone] Fax: [phone]

## 2019-02-13 ENCOUNTER — Other Ambulatory Visit: Payer: Self-pay

## 2019-02-13 ENCOUNTER — Other Ambulatory Visit: Payer: Self-pay | Admitting: *Deleted

## 2019-02-13 DIAGNOSIS — F03A0 Unspecified dementia, mild, without behavioral disturbance, psychotic disturbance, mood disturbance, and anxiety: Secondary | ICD-10-CM

## 2019-02-13 DIAGNOSIS — Z7189 Other specified counseling: Secondary | ICD-10-CM

## 2019-02-13 DIAGNOSIS — G3184 Mild cognitive impairment, so stated: Secondary | ICD-10-CM

## 2019-02-13 DIAGNOSIS — F0390 Unspecified dementia without behavioral disturbance: Secondary | ICD-10-CM

## 2019-02-13 DIAGNOSIS — G20 Parkinson's disease: Secondary | ICD-10-CM

## 2019-02-13 DIAGNOSIS — R413 Other amnesia: Secondary | ICD-10-CM

## 2019-02-13 NOTE — Patient Outreach (Signed)
[place] [facility]) Care Management  02/13/2019  ADHIRA JAMIL 01/28/1956 [id number]   Outreach Attempt:  Received referral for Disease Management.  Conferenced with [place] this morning and she plans to continue to follow patient to arrange home health care services at this time.  Will not open a disease management case at this time.   Plan:  RN Health Coach will not open Disease Management Case at this time.    [place] [phone] [email or web address]

## 2019-02-14 ENCOUNTER — Telehealth: Payer: Self-pay | Admitting: Neurology

## 2019-02-14 ENCOUNTER — Encounter: Payer: Self-pay | Admitting: *Deleted

## 2019-02-14 ENCOUNTER — Other Ambulatory Visit: Payer: Self-pay | Admitting: *Deleted

## 2019-02-14 ENCOUNTER — Telehealth: Payer: Self-pay | Admitting: Pharmacist

## 2019-02-14 DIAGNOSIS — I2510 Atherosclerotic heart disease of native coronary artery without angina pectoris: Secondary | ICD-10-CM | POA: Diagnosis not present

## 2019-02-14 DIAGNOSIS — G20 Parkinson's disease: Secondary | ICD-10-CM | POA: Diagnosis not present

## 2019-02-14 DIAGNOSIS — F0390 Unspecified dementia without behavioral disturbance: Secondary | ICD-10-CM | POA: Diagnosis not present

## 2019-02-14 DIAGNOSIS — I509 Heart failure, unspecified: Secondary | ICD-10-CM | POA: Diagnosis not present

## 2019-02-14 DIAGNOSIS — R498 Other voice and resonance disorders: Secondary | ICD-10-CM | POA: Diagnosis not present

## 2019-02-14 DIAGNOSIS — R2689 Other abnormalities of gait and mobility: Secondary | ICD-10-CM | POA: Diagnosis not present

## 2019-02-14 DIAGNOSIS — M6281 Muscle weakness (generalized): Secondary | ICD-10-CM | POA: Diagnosis not present

## 2019-02-14 DIAGNOSIS — F329 Major depressive disorder, single episode, unspecified: Secondary | ICD-10-CM | POA: Diagnosis not present

## 2019-02-14 DIAGNOSIS — I110 Hypertensive heart disease with heart failure: Secondary | ICD-10-CM | POA: Diagnosis not present

## 2019-02-14 NOTE — Telephone Encounter (Signed)
This encounter was created in error - please disregard.

## 2019-02-14 NOTE — Patient Outreach (Signed)
[place] [facility]) Care Management  [place]   02/14/2019  [name] August 16, 1956 [id number]  Reason for referral: Medication Adherence  Referral source: [facility] RN Current insurance: [employer]  PMHx includes but not limited to:   Aortic stenosis, aortic valve disorder, defibrillator placement, cardiomyopathy, CHF, hyperlipidemia, memory issues, and ventricular tachycardia.  Outreach:  Successful telephone call with patient.  HIPAA identifiers verified.   Subjective:  Patient reports sometimes getting confused with her pill box.    Patient reports using a pill box as an adherence strategy.  Her son checks behind her every week.  Does the patient ever forget to take medication?  yes Does the patient have problems obtaining medications due to transportation?   no Does the patient have problems obtaining medications due to cost?  no  Does the patient feel that medications prescribed are effective?  yes Does the patient ever experience any side effects to the medications prescribed?  yes  Does the patient measure his/her own blood glucose at home?  No Does the patient measure his/her own blood pressure at home? No   Objective: Lab Results  Component Value Date   CREATININE 0.90 04/05/2018   CREATININE 0.96 07/06/2017   CREATININE 0.94 02/08/2017    Lab Results  Component Value Date   HGBA1C 6.1 (H) 07/06/2017    Lipid Panel  No results found for: CHOL, TRIG, HDL, CHOLHDL, VLDL, LDLCALC, LDLDIRECT  BP Readings from Last 3 Encounters:  11/06/18 (!) 184/122  07/31/18 119/86  07/08/18 (!) 174/110    Allergies  Allergen Reactions  . Trospium Chloride Er Other (See Comments)    Constipation     Medications Reviewed Today    Reviewed by [name], [facility] (Pharmacist) on 02/14/19 at 1347  Med List Status: <None>  Medication Order Taking? Sig Documenting Provider Last Dose Status Informant  acetaminophen (TYLENOL) 325 MG tablet [id number] Yes  Take 325 mg by mouth every 6 (six) hours as needed for mild pain. [provider] Taking Active Self  aspirin EC 81 MG tablet [id number] Yes Take 1 tablet (81 mg total) daily by mouth. Burnell Blanks, MD Taking Active Self  carbidopa-levodopa (SINEMET IR) 25-100 MG tablet [id number] Yes Take 1 tablet twice a day at breakfast and dinner Cameron Sprang, MD Taking Active   carvedilol (COREG) 12.5 MG tablet [id number] Yes Take 1 tablet by mouth 2 (two) times a day. With food [provider] Taking Active   donepezil (ARICEPT) 10 MG tablet [id number] Yes Take 1/2 tablet daily for 2 weeks, then increase to 1 tablet daily Cameron Sprang, MD Taking Active   furosemide (LASIX) 20 MG tablet [id number] Yes Take 1 tablet by mouth daily. [provider] Taking Active   Multiple Vitamin (MULTIVITAMIN WITH MINERALS) TABS tablet [id number] Yes Take 1 tablet by mouth at bedtime. [provider] Taking Active Self  olmesartan (BENICAR) 40 MG tablet [id number] Yes Take 0.5 tablets (20 mg total) by mouth daily.  Patient taking differently:  Take 40 mg by mouth daily.    Burnell Blanks, MD Taking Active Self  Polyethyl Glycol-Propyl Glycol [facility] OP) [id number] Yes Place 1-2 drops into both eyes 3 (three) times daily as needed (for dry eyes.). [provider] Taking Active Self  polyethylene glycol Merril Abbe / GLYCOLAX) packet [id number] Yes Take 17 g by mouth daily. Domenic Moras, PA-C Taking Active            Med Note Luciana Axe, Angelia Mould  Fri Feb 14, 2019  1:47 PM) PRN        Discontinued 16/24/46 9507 (Duplicate)   rosuvastatin (CRESTOR) 10 MG tablet [id number] Yes Take 1 tablet (10 mg total) by mouth daily. Evans Lance, MD Taking Active Self  sertraline (ZOLOFT) 100 MG tablet [id number] Yes Take 100 mg by mouth daily.  [provider] Taking Active Self          Assessment:  Drugs sorted by system:  Neurologic/Psychologic: Carbidopa-levodopa,  Donepezil, Sertraline  Cardiovascular: Aspirin, Carvedilol, Furosemide, Olmesartan, Rosuvastatin  Gastrointestinal: Polyethylene Glycol  Pain: Acetaminophen,   Vitamins/Minerals/Supplements: Multiple Vitamin,   Miscellaneous: Systane  Medication Review Findings:  . Adherence-patient mentioned she sometimes gets confused with filling her pill box. Her son usually checks behind her every week. Pill packs were discussed and the patient said she was interested. . Several local pharmacies were called to check on delivery fee and fee for the service. [employer] does not charge for the [employer] but they do charge a $3 delivery fee. . [facility] does not charge for the pill pack service or for delivery.  Medication Assistance Findings:  No medication assistance needs identified  Extra Help:  Already receiving Full Extra Help Low Income Subsidy  Plan: . Will follow-up in 5-7 business days.[name], PharmD, [place] Clinical Pharmacist [phone]

## 2019-02-14 NOTE — Telephone Encounter (Signed)
Spoke with [name] Case Manager updated her on orders placed and that a nurse was with pt at this time, nurses name is Dorian Pod and that she would be calling her,

## 2019-02-14 NOTE — Telephone Encounter (Signed)
Spoke with Dorian Pod nurse with [facility] home [facility] she will call Case  [facility] to keep everyone updated, she is with Mrs Tess at this time.

## 2019-02-14 NOTE — Telephone Encounter (Signed)
Nursing will be seeing her 1 time a week for 4 weeks for medication management and assessments. Thanks!

## 2019-02-14 NOTE — Telephone Encounter (Signed)
Patient's case worker is calling in about wanting to check on her home health. She said to call her back that yall had spoken earlier this week. Thanks!

## 2019-02-14 NOTE — Patient Outreach (Addendum)
[facility]) Care Management  02/14/2019  [name] Feb 17, 1956 [id number]   Subjective:  Case discussed with [place] at [place] Management on 02/13/2019.  Per chart review home health has been ordered by Dr. Delice Lesch.   Telephone call to Dr. Santiago Glad Aquino's office [phone]), spoke with Vikki Ports, put on hold for [place] (Dr. Amparo Bristol assistant), [name] not available at this time, left message for [facility] requesting call back with status of home health referral.   Telephone call from [facility]  at Dr. Amparo Bristol office, states she has received a message from [place] at [facility] [phone] or [phone]), will be receiving home health RN for medication management every week x's 4, then will be reassessed, and Dr. Delice Lesch has also ordered the following home health services to start as soon as possible ( Education officer, museum to assess family dynamic, follow up on assisted living placement, Aide, Physical therapy, Occupational therapy, Speech therapy).  States she will return Ellen's call and advised Dorian Pod to give this RNCM an update.   Telephone call from [facility]  at Dr. Amparo Bristol office, states she spoke with Dorian Pod at [facility], Dorian Pod states she is currently in the patient's home doing start of care visit, and will call this RNCM with update once visit completed.  Telephone call from Clarene Critchley) [place] at [facility] [phone]), states she is patient's assigned home health nurse case manager, another nurse will be seeing patient for weekly visits, just completed start of care visit with patient and patient's son.  States patient son was very attentive during the visit and son checks on patient everyday after work.   States patient is doing great, very appreciative of assistance, and having a lot of memory issues.  Discussed [facility] Care Management services, this RNCM's conversations with [name] at Dr. Amparo Bristol office on   02/12/2019, above conversations with [name]  and conversation with patient on 02/12/2019.  Dorian Pod states she has already received orders for home health RN, physical therapy, and speech.  She will add order for home health occupational therapy, social worker, and aide.  Dorian Pod given [place] Management Pharmacist contact number [name]), states she will follow up with Alwyn Ren, regarding patient's medication management needs based on today's visit, and she will inquire if pill pack ([facility]) resource would be beneficial for patient. Dorian Pod states she will call this RNCM with periodic patient updates and this RNCM can call Dorian Pod for updates as needed.   Telephone from patient, HIPAA verified, states she is calling regarding an update.   States she just received a visit from [place] [facility] and pleased with services to date.  Discussed RNCM's conversations with [name] at Dr. Amparo Bristol office, Dorian Pod at [facility], and  [place] at [place] Management.  Patient updated on the status of home health services, status of [facility] Care Management referral services, patient voices understanding, and is in agreement.  Patient states she does not remember speaking with [place] Management Pharmacist.   Patient advised of her [place] Management Team Members Porfirio Oar [place], and this [facility]).   Patient in agreement with hypertension education on hold until care coordination is completed to prevent patient overload.  States she a question regarding Dr. Delice Lesch wanting to put her in an assisted living facility and does not feel like she is ready for that, advised patient to discuss her concerns with Dr. Delice Lesch, Social Worker from [facility] and [facility].  Discussed patient  concerns with memory, falls, medication management, available assistance, patient voices understanding, states she will follow up with her family, and providers regarding her concerns.  Patient in agreement to [place] Coordination program and [facility] will follow up next week to complete assessment.    Objective: Per KPN (Knowledge Performance Now, point of care tool) and chart review, patient has not had any recent hospitalizations or ED visits.  Patient also has a history of hyperlipidemia, CAD, AICD (automatic cardioverter/defibrillator), Aortic stenosis,Bicuspid aortic valve,  Benign neoplasm of colon, Depression, hypertension, and Memory changes.       Assessment: Received [employer] health plan referral on 02/06/2019.  Referral source: Oliver [place] ( [phone] ext. [phone]).   Referral reason: Dementia, Limited support system, may need physical therapy.   Screening follow up completed, patient has been referred to [place] for hypertension education / management, Pharmacy for medication management,  Social Worker to follow up on depression screening tool results, and provide resources.  [name] referral closed at this time, RNCM has  verified status of home health referral and will follow for home health care coordination.     Plan: RNCM has sent patient successful outreach letter, [facility] pamphlet, magnet, and Advanced Directive packet per patient's request. RNCM has referred patient to [place] for hypertension education / management,. RNCM has referred patient to [place] for medication management. RNCM has referred patient to [place] Management Social Worker for follow up on depression screening tool results, and provide resources.  RNCM will follow up within 7 business days to complete home health care coordination program assessment.      Nareg Breighner H. Annia Friendly, BSN, [place] Management [facility] Telephonic CM Phone: [phone] Fax: [phone]

## 2019-02-15 DIAGNOSIS — N3281 Overactive bladder: Secondary | ICD-10-CM | POA: Diagnosis not present

## 2019-02-15 DIAGNOSIS — F411 Generalized anxiety disorder: Secondary | ICD-10-CM | POA: Diagnosis not present

## 2019-02-15 DIAGNOSIS — F321 Major depressive disorder, single episode, moderate: Secondary | ICD-10-CM | POA: Diagnosis not present

## 2019-02-15 DIAGNOSIS — I5022 Chronic systolic (congestive) heart failure: Secondary | ICD-10-CM | POA: Diagnosis not present

## 2019-02-15 DIAGNOSIS — N393 Stress incontinence (female) (male): Secondary | ICD-10-CM | POA: Diagnosis not present

## 2019-02-15 DIAGNOSIS — I110 Hypertensive heart disease with heart failure: Secondary | ICD-10-CM | POA: Diagnosis not present

## 2019-02-15 DIAGNOSIS — G20 Parkinson's disease: Secondary | ICD-10-CM | POA: Diagnosis not present

## 2019-02-15 DIAGNOSIS — I429 Cardiomyopathy, unspecified: Secondary | ICD-10-CM | POA: Diagnosis not present

## 2019-02-15 DIAGNOSIS — M545 Low back pain: Secondary | ICD-10-CM | POA: Diagnosis not present

## 2019-02-18 ENCOUNTER — Ambulatory Visit: Payer: Self-pay | Admitting: Pharmacist

## 2019-02-18 ENCOUNTER — Telehealth: Payer: Self-pay | Admitting: Neurology

## 2019-02-18 ENCOUNTER — Other Ambulatory Visit: Payer: Self-pay | Admitting: Pharmacist

## 2019-02-18 DIAGNOSIS — I110 Hypertensive heart disease with heart failure: Secondary | ICD-10-CM | POA: Diagnosis not present

## 2019-02-18 DIAGNOSIS — G20 Parkinson's disease: Secondary | ICD-10-CM | POA: Diagnosis not present

## 2019-02-18 DIAGNOSIS — I2510 Atherosclerotic heart disease of native coronary artery without angina pectoris: Secondary | ICD-10-CM | POA: Diagnosis not present

## 2019-02-18 DIAGNOSIS — I509 Heart failure, unspecified: Secondary | ICD-10-CM | POA: Diagnosis not present

## 2019-02-18 DIAGNOSIS — F0390 Unspecified dementia without behavioral disturbance: Secondary | ICD-10-CM | POA: Diagnosis not present

## 2019-02-18 DIAGNOSIS — M6281 Muscle weakness (generalized): Secondary | ICD-10-CM | POA: Diagnosis not present

## 2019-02-18 DIAGNOSIS — R498 Other voice and resonance disorders: Secondary | ICD-10-CM | POA: Diagnosis not present

## 2019-02-18 DIAGNOSIS — R2689 Other abnormalities of gait and mobility: Secondary | ICD-10-CM | POA: Diagnosis not present

## 2019-02-18 DIAGNOSIS — F329 Major depressive disorder, single episode, unspecified: Secondary | ICD-10-CM | POA: Diagnosis not present

## 2019-02-18 NOTE — Patient Outreach (Signed)
[facility]) Care Management  02/18/2019  [name] 12/29/1955 [id number]   Called patient to follow up on medication adherence and pill pack referral. Unfortunately, she did not answer the phone. HIPAA compliant message was left on her voicemail.  Called [facility] to get the prescriber on the patient's prescriptions:     Dr. Delice Lesch: Carbidopa-Levodopa Donepezil  Dr. Tonny Bollman Furosemide Olmesartan Rosuvastatin Carvedilol Sertraline  Called patient's home health nurse and left a message requesting a call back. Need to get verification to go ahead and request the provider's to send the prescriptions to mail order.  Follow up in 3-5 business days.  [name], PharmD, [place] Clinical Pharmacist [phone]

## 2019-02-18 NOTE — Telephone Encounter (Signed)
Verbal order for PT 1x a week for 1 week /2xs a week for 3 weeks. Please call her back at [phone]. Thanks!

## 2019-02-18 NOTE — Telephone Encounter (Signed)
[name] called no answer voice mail left for her to call back

## 2019-02-19 ENCOUNTER — Other Ambulatory Visit: Payer: Self-pay | Admitting: *Deleted

## 2019-02-19 ENCOUNTER — Telehealth: Payer: Self-pay | Admitting: Neurology

## 2019-02-19 DIAGNOSIS — G20 Parkinson's disease: Secondary | ICD-10-CM | POA: Diagnosis not present

## 2019-02-19 DIAGNOSIS — M6281 Muscle weakness (generalized): Secondary | ICD-10-CM | POA: Diagnosis not present

## 2019-02-19 DIAGNOSIS — F0390 Unspecified dementia without behavioral disturbance: Secondary | ICD-10-CM | POA: Diagnosis not present

## 2019-02-19 DIAGNOSIS — I110 Hypertensive heart disease with heart failure: Secondary | ICD-10-CM | POA: Diagnosis not present

## 2019-02-19 DIAGNOSIS — I2510 Atherosclerotic heart disease of native coronary artery without angina pectoris: Secondary | ICD-10-CM | POA: Diagnosis not present

## 2019-02-19 DIAGNOSIS — R2689 Other abnormalities of gait and mobility: Secondary | ICD-10-CM | POA: Diagnosis not present

## 2019-02-19 DIAGNOSIS — R498 Other voice and resonance disorders: Secondary | ICD-10-CM | POA: Diagnosis not present

## 2019-02-19 DIAGNOSIS — F329 Major depressive disorder, single episode, unspecified: Secondary | ICD-10-CM | POA: Diagnosis not present

## 2019-02-19 DIAGNOSIS — I509 Heart failure, unspecified: Secondary | ICD-10-CM | POA: Diagnosis not present

## 2019-02-19 NOTE — Telephone Encounter (Signed)
Patient had a fall today-she slid out of chair and hit the floor. No injuries. Just wanted to report this. Thanks!

## 2019-02-19 NOTE — Telephone Encounter (Signed)
Called home health back no answer left voice mail will call back again later

## 2019-02-19 NOTE — Telephone Encounter (Signed)
Called [facility] to speak to [facility] the social worker waiting for her to call me back,

## 2019-02-19 NOTE — Telephone Encounter (Signed)
Noted. [name] can you pls call the number for Home Health back and see if social worker can help with getting her placed in assisted living?

## 2019-02-19 NOTE — Patient Outreach (Signed)
[facility]) Care Management  02/19/2019  [name] Feb 11, 1956 [id number]   Subjective: Telephone call to patient's home  / mobile number, no answer, left HIPAA compliant voicemail message, and requested call back.    Objective:Per KPN (Knowledge Performance Now, point of care tool) and chart review,patient has not had any recent hospitalizations or ED visits. Patient also has a history of hyperlipidemia, CAD, AICD (automatic cardioverter/defibrillator),Aortic stenosis,Bicuspid aortic valve,Benign neoplasm of colon,Depression, hypertension, andMemory changes.      Assessment: Received [employer] health plan referral on 02/06/2019. Referral source: [place]([phone] ext. [phone]). Referral reason:Dementia, Limited support system, may need physical therapy.Screening follow up completed, patient has been referred to [place] for hypertension education / management, Pharmacy for medication management, Social Worker to follow up on depression screening tool results, and provide resources.  [name] referral closed at this time, RNCM has verified status of home health referral and will follow for home health care coordination.  Home health care coordination pending patient contact.     Plan:RNCM has sent patient successful outreach letter, [facility] pamphlet, magnet, and Advanced Directive packet per patient's request. RNCM will call patient for 2nd telephone outreach attempt within 4 business days, home health care coordination program initial assessment completion, and proceed with case closure, within 10 business days if no return call.     Leanore Biggers H. Annia Friendly, BSN, [place] Management [facility] Telephonic CM Phone: [phone] Fax: [phone]

## 2019-02-20 ENCOUNTER — Encounter: Payer: Self-pay | Admitting: *Deleted

## 2019-02-20 ENCOUNTER — Other Ambulatory Visit: Payer: Self-pay | Admitting: *Deleted

## 2019-02-20 ENCOUNTER — Ambulatory Visit: Payer: Self-pay | Admitting: *Deleted

## 2019-02-20 ENCOUNTER — Telehealth: Payer: Self-pay | Admitting: Neurology

## 2019-02-20 DIAGNOSIS — R498 Other voice and resonance disorders: Secondary | ICD-10-CM | POA: Diagnosis not present

## 2019-02-20 DIAGNOSIS — I110 Hypertensive heart disease with heart failure: Secondary | ICD-10-CM | POA: Diagnosis not present

## 2019-02-20 DIAGNOSIS — F0390 Unspecified dementia without behavioral disturbance: Secondary | ICD-10-CM | POA: Diagnosis not present

## 2019-02-20 DIAGNOSIS — I509 Heart failure, unspecified: Secondary | ICD-10-CM | POA: Diagnosis not present

## 2019-02-20 DIAGNOSIS — I2510 Atherosclerotic heart disease of native coronary artery without angina pectoris: Secondary | ICD-10-CM | POA: Diagnosis not present

## 2019-02-20 DIAGNOSIS — G20 Parkinson's disease: Secondary | ICD-10-CM | POA: Diagnosis not present

## 2019-02-20 DIAGNOSIS — R2689 Other abnormalities of gait and mobility: Secondary | ICD-10-CM | POA: Diagnosis not present

## 2019-02-20 DIAGNOSIS — F329 Major depressive disorder, single episode, unspecified: Secondary | ICD-10-CM | POA: Diagnosis not present

## 2019-02-20 DIAGNOSIS — M6281 Muscle weakness (generalized): Secondary | ICD-10-CM | POA: Diagnosis not present

## 2019-02-20 NOTE — Patient Outreach (Signed)
[name]) Care [name]  02/20/2019  [name] 04-09-1956 [id number]  CSW was able to make initial contact with patient today to perform phone assessment, as well as assess and assist with social work needs and services.  CSW introduced self, explained role and types of services provided through [place] [name] ([name]).  CSW further explained to patient that CSW works with patient's Telephonic RNCM, also with [name] [name], [name]. CSW then explained the reason for the call, indicating that [name] thought that patient would benefit from social work services and resources to assist with counseling and supportive services for symptoms of depression.  CSW obtained two HIPAA compliant identifiers from patient, which included patient's name and date of birth.  After thorough review of patient's electronic medical record, CSW noted that patient has been diagnosed with Severe Aortic Stenosis with Mild Dementia.  Patient admits to experiencing memory deficits, noticing certain changes in behavior within herself.  Patient indicated that she misplaces things a lot and that she recently had to turn in her driver's license.  Patient has been prescribed Donepezil 10 MG PO daily to help with symptoms of Dementia.  Patient stated that she currently lives at home alone, but that her son, [name] stays with her in the evenings.  It was recommended that patient consider long-term care placement into an assisted living facility that offers memory care services and has the wander-guard system, but patient reported that she is not willing to consider placement, of any kind, at this time.  CSW agreed to offer assistance to patient with regards to placement, when she is ready to consider this as an option.    Orders have been written for patient to receive home health services through [name], which includes nursing,  physical therapy, occupational therapy, speech therapy, social work and aide services.  Patient admitted that she does not take her medications as prescribed because she forgets.  A referral has been placed to [name], Pharmacist with [name] to assist with medication [name]/administration, education and setting patient up with a talking pill box.  Patient indicated that Mr. [name] is currently assisting with this process, but is unable to remind patient to take her morning and afternoon medications due to his work schedule.  Patient is not agreeable to having CSW arrange for her to receive in-home care services, admitting that she is not able to afford to pay for any additional expenses each month.  CSW was able to follow-up with patient regarding recommendations for completing Advanced Directives (Living Will and [name] documents) and is aware that an Advanced Directives packet has been mailed to patient's home by [name].  Patient admits to receiving the packet, but indicated that she has not yet had an opportunity to speak with Mr. [name] about her wishes.  CSW explained to patient that CSW is happy to assist patient with completion of the documents, reminding patient of the importance of having these documents in place.  CSW made patient aware of the list of [name] affilitated facilities (in the back of the packet) where patient can have the documents notarized, once they have been completed.  Patient was encouraged to keep the original copy and provide copies to her appointed healthcare agent, as well as her primary care physician.   Patient stated, "I don't remember talking with anyone about my depression".  CSW explained to patient that during [name]'s assessment with patient, patient indicated that she has a significant history of depression and scored rather high on her depression screenings.  CSW then explained to patient  that patient verbalized to her Neurologist, [name], back in February of 2020, that she endorsed feeling depressed a lot and that is when Dr. Delice [name] increased her Zoloft from 50 MG PO daily to 100 MG PO daily.  CSW went on to explain to patient that patient scored a 5 on her PHQ -2 Depression Screening and a 16 on her PHQ - 9 Depression Screening with CSW today.  Patient reported that she has experienced depressive symptoms over the last year, due to several deaths that she has endured, that she believes she has not dealt with yet.    CSW explained to patient that CSW is more than happy to make a referral for patient to attend Grief and Loss Counseling Services through [name], now Manufacturing engineer.  CSW briefly described the types of grief counseling that is offered, including short-term individual counseling, which may involve referrals for more intensive therapy (when appropriate), support groups (both on-going and time-limited), educational material for individualized learning and coping strategies for when symptoms become almost unbearable.  CSW further explained to patient that there are other support programs offered in the [name] area, such as [place] with Grief and Mourning, Bereavement and Loss, [facility] - [place], Healing and Happiness:  Life after Death and Grief Recovery Action Program, just to name a few.  CSW offered to mail patient information about Hospice, [facility], [facility], Grief Recovery Action Program, and various other support groups and programs, as well as a list of counselors/therapists in [place] that specialize in grief and loss counseling; however patient did not appear to the interested.  CSW also agreed to provide short-term telephonic counseling services to patient, at least until CSW is able to get patient established with a therapist in the community, or until Covid-19 restrictions are  lifted, which would then allow CSW to meet with patient face-to-face in her own home.  Patient did not appear to be receptive, but agreed to "give it some consideration".  Patient stated that she relies on her Faith and family to get her through hard times in life.   In the meantime, CSW explained to patient that CSW would be mailing patient the following EMMI information for her review: "Coping with a Health Condition:  Signs of Depression", "Depression", "Depression:  Medication", "Depression:  Other Things You Can Do" and "Dealing with Death".  CSW then agreed to follow-up with patient in a week to ensure that she received the information, as well as answer any questions that she may have at that time pertaining to the information she received.  Patient then had to end the call abruptly, reporting that there was a knock at her door.  Patient took down CSW's contact information, agreeing to return CSW's call later in the day.  CSW will make arrangements to follow-up with patient again on Wednesday, February 26, 2019, around 9:00AM, if a return call is not received from patient in the meantime.  [name], BSW, MSW, LCSW  Licensed Education officer, [facility]  Mailing [place] [address] [place], [place] [address] Physical Address-[name], [place], [place] [address] Toll Free Main # [phone] Fax # [phone] Cell # [phone]  Office # [phone] [name]

## 2019-02-20 NOTE — Patient Outreach (Addendum)
[name] [facility]) Care Management  02/20/2019  [name] 07/14/1956 [id number]   Subjective: Received voicemail message from [name] at Dr. Amparo Bristol office and requested call back [phone]).   Telephone to [facility] at Dr. Amparo Bristol office answering service, states office is closed until 1:00pm for lunch, left message for [name], and requested call back.  Telephone call to patient's home  / mobile number times 2, line busy, no answer, and unable to leave a message.     Objective:Per KPN (Knowledge Performance Now, point of care tool) and chart review,patient has not had any recent hospitalizations or ED visits. Patient also has a history of hyperlipidemia, CAD, AICD (automatic cardioverter/defibrillator),Aortic stenosis,Bicuspid aortic valve,Benign neoplasm of colon,Depression, hypertension, andMemory changes.      Assessment: Received [employer] health plan referral on 02/06/2019. Referral source: [facility]([phone] ext. [phone]). Referral reason:Dementia, Limited support system, may need physical therapy.Screening follow up completed, patienthas been referredto [facility] for hypertension education / management, Pharmacy for medication management, Social Worker to follow up on depression screening tool results, and provide resources.[name] referral closed at this time, RNCMhasverifiedstatus of home health referral and will follow for home health care coordination. Home health care coordination pending patient contact.     Plan:RNCM will send unsuccessful outreach  letter, [facility] pamphlet, and will call patient for 3rd telephone outreach attempt within 4 business days, home health care coordination program initial assessment completion, and proceed with case closure, within 10 business days if no return call.     Brannon Levene H. Annia Friendly, BSN, [place] Management [facility] Telephonic CM Phone:  [phone] Fax: [phone]

## 2019-02-20 NOTE — Telephone Encounter (Signed)
Vital report BP: 148/106- request for OT 2xs a week for 2 weeks and then 1x a week for the 3rd week. Please call him back at [phone]. Thanks!

## 2019-02-20 NOTE — Telephone Encounter (Signed)
Dr Delice Lesch aware of BP verbal order given for OT

## 2019-02-21 ENCOUNTER — Other Ambulatory Visit: Payer: Self-pay | Admitting: Pharmacist

## 2019-02-21 ENCOUNTER — Other Ambulatory Visit: Payer: Self-pay | Admitting: *Deleted

## 2019-02-21 ENCOUNTER — Ambulatory Visit: Payer: Self-pay | Admitting: Pharmacist

## 2019-02-21 NOTE — Telephone Encounter (Signed)
Sorry for back and forth, pls let Ms. Burt Knack know that this has been discussed with her several times as well, but she has no insight into her condition. I have never met the son, but staff has talked to him, and I'm not quite sure he understands his mother's situation as well. If her brother/niece are on the DPR, can Ms. Burt Knack also speak to them, because [place] (my prior MA) had discussed that if family is concerned, maybe APS should be involved. Thanks

## 2019-02-21 NOTE — Telephone Encounter (Signed)
Mrs [name] left the office at 1 today will call her Monday to talk to her about Mrs [name]

## 2019-02-21 NOTE — Telephone Encounter (Signed)
Talked to [name] with [facility] she stated the social worker with them could help with placement but Mrs Geidel has told them that she is not ready for placement and that she does not understand why she needs placement, Mrs. Burt Knack stated that she was going to try and talk to the son today and see how he thinks his mother is doing, she asked if we can call and talk to Mrs Kiner and explain why she needs placement or either get her an earlier appointment

## 2019-02-21 NOTE — Patient Outreach (Signed)
[facility]) Care Management  02/21/2019  [name] 12-16-1955 [id number]   Subjective: Telephone call to [name] at Dr. Amparo [place] office [phone]), states patient has had another fall without injuries, Dr. Delice Lesch was wondering if [place] Management Social Worker could assist with assisted living facility placement, and [name] has not received update from home health [name] (home health social worker) regarding placement assistance.  RNCM advised [name] of [place] Management Social Worker's conversation with patient on 02/20/2019,  patient's refusal of placement, and placement assistance at this time. [name] states patient has not declined assistant living placement and patient stated that placement had not taken place to date because of COVID 19 pandemic and facilities would not take her.  Discussed unable to confirm family support for placement at this time and [name] will discuss with Dr. Delice Lesch.  RNCM advised per [place] Management Social Worker, patient did not recall her agreement to Depression Screening follow up.   Discussed RNCM's conversation with patient, home health nurse case manager on 02/14/2019, and care coordination program assessment follow up plan.  [name] voices understanding of plan, will update Dr. Delice Lesch, and discuss next steps.      Objective:Per KPN (Knowledge Performance Now, point of care tool) and chart review,patient has not had any recent hospitalizations or ED visits. Patient also has a history of hyperlipidemia, CAD, AICD (automatic cardioverter/defibrillator),Aortic stenosis,Bicuspid aortic valve,Benign neoplasm of colon,Depression, hypertension, andMemory changes.     Assessment: Received [employer] health plan referral on 02/06/2019. Referral source: [name] ext. [phone]). Referral reason:Dementia, Limited support system, may need physical therapy.Screening follow up completed, patienthas been  referredto [facility] for hypertension education / management, Pharmacy for medication management, Social Worker to follow up on depression screening tool results, and provide resources.[name] referral closed at this time, RNCMhasverifiedstatus of home health referral and will follow for home health care coordination.Home health care coordination assessment completion pending patient contact.    Plan:RNCM will send unsuccessful outreach  letter, [facility] pamphlet, and will call patient for 3rd telephone outreach attempt within 4 business days,home healthcare coordination program initial assessment completion,and proceed with case closure, within 10 business days if no return call.     Mccall Lomax H. Annia Friendly, BSN, [place] Management [facility] Telephonic CM Phone: [phone] Fax: [phone]

## 2019-02-21 NOTE — Patient Outreach (Addendum)
[facility]) Care Management  02/21/2019  EMMAGRACE RUNKEL 1956-07-15 [id number]  [name] was called to inquire about starting compliance packing for his mother's medications. He previously said [facility] would be ok but I wanted to get the full "go ahead" before having the patient's medications transferred.  A message was left for her home health nurse earlier this week as well.  Review of patient's chart today revealed another fall and her provider interested in having the patient placed in a facility.  Plan: Call back in 1 week.   [name], PharmD, [place] Clinical Pharmacist [phone]

## 2019-02-22 DIAGNOSIS — G20 Parkinson's disease: Secondary | ICD-10-CM | POA: Diagnosis not present

## 2019-02-22 DIAGNOSIS — I509 Heart failure, unspecified: Secondary | ICD-10-CM | POA: Diagnosis not present

## 2019-02-22 DIAGNOSIS — I2510 Atherosclerotic heart disease of native coronary artery without angina pectoris: Secondary | ICD-10-CM | POA: Diagnosis not present

## 2019-02-22 DIAGNOSIS — F329 Major depressive disorder, single episode, unspecified: Secondary | ICD-10-CM | POA: Diagnosis not present

## 2019-02-22 DIAGNOSIS — R2689 Other abnormalities of gait and mobility: Secondary | ICD-10-CM | POA: Diagnosis not present

## 2019-02-22 DIAGNOSIS — M6281 Muscle weakness (generalized): Secondary | ICD-10-CM | POA: Diagnosis not present

## 2019-02-22 DIAGNOSIS — I110 Hypertensive heart disease with heart failure: Secondary | ICD-10-CM | POA: Diagnosis not present

## 2019-02-22 DIAGNOSIS — F0390 Unspecified dementia without behavioral disturbance: Secondary | ICD-10-CM | POA: Diagnosis not present

## 2019-02-22 DIAGNOSIS — R498 Other voice and resonance disorders: Secondary | ICD-10-CM | POA: Diagnosis not present

## 2019-02-24 ENCOUNTER — Ambulatory Visit: Payer: Self-pay | Admitting: *Deleted

## 2019-02-24 ENCOUNTER — Ambulatory Visit: Payer: Self-pay | Admitting: Pharmacist

## 2019-02-24 DIAGNOSIS — G20 Parkinson's disease: Secondary | ICD-10-CM | POA: Diagnosis not present

## 2019-02-24 DIAGNOSIS — I2510 Atherosclerotic heart disease of native coronary artery without angina pectoris: Secondary | ICD-10-CM | POA: Diagnosis not present

## 2019-02-24 DIAGNOSIS — F0390 Unspecified dementia without behavioral disturbance: Secondary | ICD-10-CM | POA: Diagnosis not present

## 2019-02-24 DIAGNOSIS — I509 Heart failure, unspecified: Secondary | ICD-10-CM | POA: Diagnosis not present

## 2019-02-24 DIAGNOSIS — R2689 Other abnormalities of gait and mobility: Secondary | ICD-10-CM | POA: Diagnosis not present

## 2019-02-24 DIAGNOSIS — I110 Hypertensive heart disease with heart failure: Secondary | ICD-10-CM | POA: Diagnosis not present

## 2019-02-24 DIAGNOSIS — R498 Other voice and resonance disorders: Secondary | ICD-10-CM | POA: Diagnosis not present

## 2019-02-24 DIAGNOSIS — M6281 Muscle weakness (generalized): Secondary | ICD-10-CM | POA: Diagnosis not present

## 2019-02-24 DIAGNOSIS — F329 Major depressive disorder, single episode, unspecified: Secondary | ICD-10-CM | POA: Diagnosis not present

## 2019-02-25 DIAGNOSIS — F0390 Unspecified dementia without behavioral disturbance: Secondary | ICD-10-CM | POA: Diagnosis not present

## 2019-02-25 DIAGNOSIS — M6281 Muscle weakness (generalized): Secondary | ICD-10-CM | POA: Diagnosis not present

## 2019-02-25 DIAGNOSIS — I2510 Atherosclerotic heart disease of native coronary artery without angina pectoris: Secondary | ICD-10-CM | POA: Diagnosis not present

## 2019-02-25 DIAGNOSIS — F329 Major depressive disorder, single episode, unspecified: Secondary | ICD-10-CM | POA: Diagnosis not present

## 2019-02-25 DIAGNOSIS — I509 Heart failure, unspecified: Secondary | ICD-10-CM | POA: Diagnosis not present

## 2019-02-25 DIAGNOSIS — I110 Hypertensive heart disease with heart failure: Secondary | ICD-10-CM | POA: Diagnosis not present

## 2019-02-25 DIAGNOSIS — G20 Parkinson's disease: Secondary | ICD-10-CM | POA: Diagnosis not present

## 2019-02-25 DIAGNOSIS — R2689 Other abnormalities of gait and mobility: Secondary | ICD-10-CM | POA: Diagnosis not present

## 2019-02-25 DIAGNOSIS — R498 Other voice and resonance disorders: Secondary | ICD-10-CM | POA: Diagnosis not present

## 2019-02-25 NOTE — Telephone Encounter (Signed)
Tiffany. [name] is going to talk to her supervisor about what steps to take when getting APS involved. They have been trying to call [name] she isn't answering calls, yesterday home health nurse called me about a medication while with [name] so she is ok I informed Tiffany.[name] of this as well. Tiffany. [name] was asking if you know the steps of getting APS involved?

## 2019-02-26 ENCOUNTER — Other Ambulatory Visit: Payer: Self-pay | Admitting: *Deleted

## 2019-02-26 NOTE — Telephone Encounter (Signed)
I believe that if social worker feels she is not safe in her home environment, is that not their call to call Adult Protective Services? I don't know the protocol as well. Family can call APS as well, if she wants to speak with patient's brother/niece who are listed on her contacts on Epic. Thanks

## 2019-02-26 NOTE — Patient Outreach (Signed)
[name] [facility]) Care Management  02/26/2019  [name] 1955/12/18 [id number]   CSW was able to make contact with patient today to follow-up regarding social work services and resources, as well as to ensure that patient received the packet of resource information mailed to her home.  Patient admitted to receiving the information, but denied having had an opportunity to thoroughly review the contents.  CSW offered to review the information with patient while on the phone, especially the EMMI information pertaining specifically to depression, to ensure knowledge and understanding.  Patient reported that she plans to go over the information with her son, [name], at his earliest convenience.  CSW also offered to review the Advanced Directives packet ([place] documents) with patient, but patient declined, indicating that she needs to have this conversation with Mr. [name], so that he is able to understand and acknowledge her wishes.  Patient is aware that assistance is available upon request.  CSW is not able to get patient to agree to placement, of any kind, at the present time.  Patient reported that she does not believe she is ready to be placed into a facility where assistance is offered, indicating that she is currently able to provide "independently" and "sufficiently" for herself.  CSW reminded patient that she is experiencing frequent falls, misplacing items around the house and admits to forgetting to take her medications as prescribed, three things that definitely warrant assisted living placement.  Patient stated, "I will let you know when the time is right, but I don't think I am quite ready".  CSW explained to patient that there are a great deal of advantages to assisted living placement, such as having all her meals prepared for her, having all her medications administered to her exactly as prescribed, having other residents to  interact with and the ability to attend social outings and gatherings, as assisted living facilities typically have an extensive activity board.  CSW is aware that [place] Worker with [facility], has also received an ordered to assist patient with assisted living placement; however, CSW is unaware of how successful [name] has been in her attempts.  CSW obtained verbal consent from patient to converse with Mr. [name] regarding long-term care assisted living placement for patient.  In talking with Mr. [name], he sounded completely dumbfounded when CSW mentioned that it has been recommended that patient receive placement.  Mr. [name] agreed with patient in that he does not feel that patient is ready for assisted living.  Mr. [name] went on to say that patient is receiving assistance in the home, which appears to be beneficial to her.  CSW reminded Mr. [name] that home health services are only temporary, offering to assist him with arranging in-home care services for patient.  Mr. [name] declined once he learned that patient's [facility] Medicare will not cover the expense.   Mr. [name] did not think that patient would qualify for Adult Medicaid, through the [place], not even wanting to assist patient and CSW with completion of an application.  Mr. [name] did not appear to be the least bit concerned about patient continuing to live alone and perform all activities of daily living independently.  Mr. [name] indicated that he continues to check in with patient on a daily basis to ensure that she is well and that she has everything she needs.  CSW encouraged Mr. [name] to take the time to sit  down with patient to discuss completion of her Advanced Directives packet, explaining the importance of having these documents in place.  CSW further explained to Mr. [name] that these documents may actually be time sensitive, meaning that patient really  needs to complete these documents as soon as possible, due to patient's increasing memory deficits.  Mr. [name] voiced understanding and was agreeable to this plan.  Patient denied the need for counseling and supportive services, as well as grief and loss counseling, indicating that she believes that she has a "good handle on her emotions".  CSW tried to get patient to talk about her feelings and emotions, but patient was not very forthcoming.  CSW reminded patient of CSW's credentials, explaining to patient that CSW is licensed and trained to offer such services and resources.  Patient was able to confirm that she has the correct contact information for CSW, reporting that she will make contact with CSW if assistance is needed in the near future.  Patient agreed to allow CSW to follow-up with her for one additional phone conversation, to assess the need for ongoing services, which will take place on Wednesday, March 05, 2019, around [place], BSW, MSW, [employer]  Licensed Clinical Social Worker  [place]  Mailing [place] [name], [place], [place] [address] Physical Address-[address], [place], [place] [address] Toll Free Main # [phone] Fax # [phone] Cell # [phone]  Office # [phone] [email or web address]

## 2019-02-27 ENCOUNTER — Other Ambulatory Visit: Payer: Self-pay | Admitting: *Deleted

## 2019-02-27 DIAGNOSIS — R2689 Other abnormalities of gait and mobility: Secondary | ICD-10-CM | POA: Diagnosis not present

## 2019-02-27 DIAGNOSIS — M6281 Muscle weakness (generalized): Secondary | ICD-10-CM | POA: Diagnosis not present

## 2019-02-27 DIAGNOSIS — I509 Heart failure, unspecified: Secondary | ICD-10-CM | POA: Diagnosis not present

## 2019-02-27 DIAGNOSIS — I2510 Atherosclerotic heart disease of native coronary artery without angina pectoris: Secondary | ICD-10-CM | POA: Diagnosis not present

## 2019-02-27 DIAGNOSIS — R498 Other voice and resonance disorders: Secondary | ICD-10-CM | POA: Diagnosis not present

## 2019-02-27 DIAGNOSIS — F0390 Unspecified dementia without behavioral disturbance: Secondary | ICD-10-CM | POA: Diagnosis not present

## 2019-02-27 DIAGNOSIS — I110 Hypertensive heart disease with heart failure: Secondary | ICD-10-CM | POA: Diagnosis not present

## 2019-02-27 DIAGNOSIS — F329 Major depressive disorder, single episode, unspecified: Secondary | ICD-10-CM | POA: Diagnosis not present

## 2019-02-27 DIAGNOSIS — G20 Parkinson's disease: Secondary | ICD-10-CM | POA: Diagnosis not present

## 2019-02-27 NOTE — Patient Outreach (Signed)
[facility]) Care Management  02/27/2019  [name] 1956/02/27 [id number]   Subjective: Telephone call from [facility] at Dr. Amparo Bristol office on 02/25/2019, states she has spoken with Dr. Delice Lesch, she updated MD regarding her last conversation with this RNCM on 02/21/2019.   States Dr. Delice Lesch has spoken with the patient and family regarding safety concerns, has been recommending assisted living placement due to patient's memory issues, as soon as possible, and her last conversation with patient / family was on 01/30/2019.  States patient / family were in agreement with plan and per patient placement was on hold due to [place]. States Dr. Delice Lesch feels patient has no insight into her situation, MD is requesting placement, and may need an Adult Protective Services referral.   [name] states MD is unsure of patient family dynamic and support system.   [name] states she has received updated from patient's home health care team and patient is participating with her care.  RNCM advised patient per [place] Management care team, has refused assisted living placement, and declined assistance with placement.   RNCM advised will discuss case with supervisor, determine next steps, and call [name] back with an update.   Case discussed with [place] Management, Pharmacist on 02/25/2019, gave verbal update on patient's current home health status, MD's concerns, and above conversation with MD's office.  States she is attempting to arrange pill packs to assist with medication compliance, patient's son had initially agreed, but has not return Katina's call, to finalize pill pack program arrangements, she is unable to finalize arrangements at this time, without patient / patient's son understanding, and program consent.   Alwyn Ren will hold off on attempting to contact patient's son for now and RNCM will advise Alwyn Ren when to proceed attempting to contact patient / patient's son, for pill pack program based  on patient's assisted living  placement status.  Case discussed with [name] at [facility] Management, Assistant Clinical Director, on 02/25/2019.  RNCM advised to continue to follow patient for care coordination, follow up with home health agency regarding Adult Protective Services status,  since home health agency is  providing in person services to patient, home health would determine, and implement Adult Protective Services referral if appropriate, not [facility] Care Management.    Received telephone call from [name] [facility]) Nurse Case Manager at [facility] [phone]) on 02/27/2019, states she just completed 2nd home health visit with patient, observe patient with med planner, patient required some verbal cues, patent is utilizing a notebook to remember things, is documenting notes in her notebook regarding everything, home health visits, and appointments.  States patient's son is also assisting patient with pill box organization.  Dorian Pod is requesting assistance with obtaining pill packs for patient, advised [place] [facility] Pharmacist is working on setting up pill packs, and waiting for return call from patient's son to confirm program requirements.  States patient's situation is very complexed, patient is very forgetful, she is aware she is forgetful, is participating with therapies, and therapies will be discharging patient on 03/12/2019 due to lack of skilled need.   Discussed RNCM's above conversations, Dorian Pod states patient  is close to meeting home health goals ( med planner in place, son assisting with medications, vital signs are stable, all prescribed therapies in place, patient participating with all therapies (physical therapy, occupational therapy, speech therapy), progressing toward therapy goals), and home health social worker [name] [phone]) is scheduled to do initial visit on  02/27/2019.  Dorian Pod states from her perspective does not see patient safety issue or recommend  Adult Protective Services referral at this time.  States she will advised home health social worker of MD's concerns and social worker will also assess if Adult Protective Services referral needed.  Dorian Pod states she will notify this RNCM of patient's discharge from home health services.  RNCM advised will make 3rd attempt call to patient on 02/27/2019 and will close patient as unable to contact if no return call by 03/04/2019. RNCM advised Dorian Pod, will also follow up with [place] Management Pharmacist and Social Worker , MD's office, to give update on today's conversation.   Telephone call to [name] at [facility] Management Pharmacist [phone]) on 02/27/2019, advised of above conversations. States she will reach out to patient / son this week and attempt to finalize pill pack program.   Telephone call to patient's home  / mobile number on 02/27/2019, no answer, left HIPAA compliant voicemail message, and requested call back.  Telephone call from [place] at [facility] on 02/27/2019 [phone]), advised of this RNCM's role with patient, telephonic care coordination, no home visits,  and discussed above conversations.  RNCM advised Millie of patient's current status with [facility] Care Management Social Worker.  Millie verbally given patient's assigned [facility] Care Management Social Worker  Di Kindle Saporito) contact information and states she will also follow up with Di Kindle.  Millie states she has been of the office, has not seen the patient for initial visit, and is waiting for call from patient's son to schedule initial home visit.  States patient's son is calling back on other line, she will call this RNCM back with update after initial visit, and disconnected this call speak with patient's son.   Left voicemail message for [name] at [facility] Management ( Social Worker) on 02/27/2019, and request call back.    Telephone call from [name] at [facility] Management ( Social Worker) on  02/28/2019.  Discussed above conversations and case status.  States she will follow up with [address] Worker at [facility], regarding Adult [employer] referral status and touch base with this RNCM to determine next steps.   RNCM will follow up with [name] at MD's office to give update on above conversations.    Objective:Per KPN (Knowledge Performance Now, point of care tool) and chart review,patient has not had any recent hospitalizations or ED visits. Patient also has a history of hyperlipidemia, CAD, AICD (automatic cardioverter/defibrillator),Aortic stenosis,Bicuspid aortic valve,Benign neoplasm of colon,Depression, hypertension, andMemory changes.     Assessment: Received [employer] health plan referral on 02/06/2019. Referral source: [phone] ext. [phone]). Referral reason:Dementia, Limited support system, may need physical therapy.Screening follow up completed, patienthas been referredto [facility] for hypertension education / management, Pharmacy for medication management, Social Worker to follow up on depression screening tool results, and provide resources.[name] referral closed at this time, RNCMhasverifiedstatus of home health referral and will follow for home health care coordination.Home health care coordination assessment completion pending patient contact.    Plan:RNCM will call [name] at Dr. Amparo Bristol office to give update on Adult Protective Services referral and determine next steps within 4 business days.  RNCM will proceed with case closure, within 10 business days if no return call.     Maguadalupe Lata H. Annia Friendly, BSN, [place] Management [facility] Telephonic CM Phone: [phone] Fax: [phone]

## 2019-02-28 ENCOUNTER — Telehealth: Payer: Self-pay | Admitting: Neurology

## 2019-02-28 ENCOUNTER — Other Ambulatory Visit: Payer: Self-pay | Admitting: *Deleted

## 2019-02-28 ENCOUNTER — Telehealth: Payer: Self-pay

## 2019-02-28 ENCOUNTER — Other Ambulatory Visit: Payer: Self-pay | Admitting: Pharmacist

## 2019-02-28 ENCOUNTER — Ambulatory Visit: Payer: Self-pay | Admitting: *Deleted

## 2019-02-28 ENCOUNTER — Other Ambulatory Visit: Payer: Self-pay

## 2019-02-28 DIAGNOSIS — R498 Other voice and resonance disorders: Secondary | ICD-10-CM | POA: Diagnosis not present

## 2019-02-28 DIAGNOSIS — F0390 Unspecified dementia without behavioral disturbance: Secondary | ICD-10-CM | POA: Diagnosis not present

## 2019-02-28 DIAGNOSIS — I2510 Atherosclerotic heart disease of native coronary artery without angina pectoris: Secondary | ICD-10-CM | POA: Diagnosis not present

## 2019-02-28 DIAGNOSIS — I509 Heart failure, unspecified: Secondary | ICD-10-CM | POA: Diagnosis not present

## 2019-02-28 DIAGNOSIS — G20 Parkinson's disease: Secondary | ICD-10-CM | POA: Diagnosis not present

## 2019-02-28 DIAGNOSIS — R2689 Other abnormalities of gait and mobility: Secondary | ICD-10-CM | POA: Diagnosis not present

## 2019-02-28 DIAGNOSIS — M6281 Muscle weakness (generalized): Secondary | ICD-10-CM | POA: Diagnosis not present

## 2019-02-28 DIAGNOSIS — F329 Major depressive disorder, single episode, unspecified: Secondary | ICD-10-CM | POA: Diagnosis not present

## 2019-02-28 DIAGNOSIS — I110 Hypertensive heart disease with heart failure: Secondary | ICD-10-CM | POA: Diagnosis not present

## 2019-02-28 MED ORDER — SERTRALINE HCL 100 MG PO TABS: 100.0000 mg | ORAL_TABLET | Freq: Every day | ORAL | 11 refills | Status: DC

## 2019-02-28 MED ORDER — CARBIDOPA-LEVODOPA 25-100 MG PO TABS: ORAL_TABLET | ORAL | 11 refills | Status: DC

## 2019-02-28 MED ORDER — DONEPEZIL HCL 10 MG PO TABS: ORAL_TABLET | ORAL | 11 refills | Status: DC

## 2019-02-28 NOTE — Telephone Encounter (Signed)
Sandy from [facility] called to inform that pt only had speech therapy once this week.

## 2019-02-28 NOTE — Telephone Encounter (Signed)
Left message with after hour service on 02-28-19 @ 12:56 pm    Caller states she needs a patient DR to send in script   Request to speak to a DR   They are working on getting the patient on the pill packs they are going to have all scripts sent to another pharmacy.... [facility] [phone] fax number [phone]

## 2019-02-28 NOTE — Telephone Encounter (Signed)
Sonda Rumble from [facility] called they are leaving APS up to the home health care, pt is doing good with her home care, she will soon be meeting all of her goals around June 17th, pt has a notebook to keep notes to help remember, pharmacy with [facility] is trying to start pill pack for pt at 1st pt ans son was for it now they will not return any calls and until they [facility] they can not start the pill packs, Sonda Rumble [facility] has called x 3 with no answer she will DC her case on the 9th depending on if she hears from the pt this week or not,Millie social worker from home health called Sonda Rumble and went to see Mrs Diemer today 02/28/2019. [facility] social work spoke with mrs Kenner and her son both said not ready for placement will call when ready for placement, pt son stays with pt some nights, he helps with medications some, Mrs Jarrard is home alone during the day, [facility] and home health social worker will talk to see if APS referral is needed, fall teaching has been done with Mrs. Dalbert Batman, If any of the other therapies with [facility] do not hear from Mrs Arriola this week they will be closing their cases as well with in the week. Sonda Rumble was asking if any Compency test would need to be done on Mrs Heiny?

## 2019-02-28 NOTE — Patient Outreach (Signed)
[facility]) Care Management  02/28/2019  [name] [birth year] [id number]   Patient, her son, and the patient's home care nurse were all called to follow up on pill packing. HIPAA compliant messages were left on the patient and the nurse's voicemails.  [name]'s phone rang more than 10 times without a voicemail picking up.  Called Dr. Amparo Bristol office and left a message for her to send new prescriptions to [facility].   Called Dr. Versie Starks office but his office closes at 12 noon on Fridays.  Called [facility] a few weeks ago to determine which medications were written by which provider:   Dr. Delice Lesch: Carbidopa-Levodopa Donepezil  Dr. Tonny Bollman Furosemide Olmesartan Rosuvastatin Carvedilol Sertraline   Plan: Follow up on Monday   [name], PharmD, [place] Clinical Pharmacist [phone]

## 2019-02-28 NOTE — Telephone Encounter (Signed)
Spoke with Alwyn Ren with [place], pts scripts for zoloft aricept and sinemet sent to [place] for her medication to be pill packed, Spoke with Dr. Delice Lesch and she okayed pts medications being sent to be pill packed for medication management better for the patient

## 2019-02-28 NOTE — Patient Outreach (Signed)
[facility]) Care Management  02/28/2019  [name] [birth year] [id number]  CSW received an incoming call from patient's Telephonic RNCM with [place] Management, [name] to discuss patient's case at length, as well as to ensure that we are providing all available and necessary services and resources to patient in the home.  [name] indicated that she has been in constant communication with patient's Primary Care Physician, Dr. Ellouise [name] via Dr. Amparo [name] Nurse, [name], to provide updates on patient's status.  [name] has also been in contact with patient's [name] to obtain information about [name]'s home visits with patient.  [name] verbalized concerns about patient being able to manage her medications, indicating that [name] needed to provide patient with several verbal cues.  CSW is aware that [name], Pharmacist with [facility] Management, is working with patient and patient's son, [name] on trying to get patient set up with pill packs.  CSW was able to make contact with patient's Home Health Social Worker with [place] today to follow-up regarding social work services and resources for patient.  [name] reported that she had just left patient's home, at the time of CSW's call, feeling a great sense of relief, indicating that patient was lucid today and oriented X3.  [name] went on to say that she truly believes that patient is safe in her own home and is actually functioning at a high level.  [name] was able to take a tour of patient's home to assess for any safety concerns. Patient verbalized to [name] that she is very happy in her home and feels as though she has all the resources necessary to care for herself independently.  [name] was able to confirm with patient that Mr. [name]  visits with patient on a daily basis, often several times per day, to ensure that patient is taking her medications properly and to provide patient with hot meals.  [name] reported that patient does not leave her home, unless she is accompanied by Mr. [name].  [name] reported that patient is meticulous about writing everything down in a notebook, so that she is able to recall her phone conversations with all the various disciplines,  as well as to be able to recite information to Mr. [name].  [name] did not feel that patient warrants a referral to Adult [employer], through the [place], believing that patient is still able to make appropriate decisions for herself regarding her own health.  [name] indicated that she plans to contact Dr. Delice [name] to report findings of her visit with patient today.  CSW inquired as to how CSW could be of greater assistance to [name], but [name] denied being able to identify any additional social work specific needs at present, other than to request that CSW continue to try and obtain approval from patient to allow CSW to provide patient with counseling and supportive services, as well as grief and loss counseling.  CSW voiced understanding, agreeing to continue to try and pursue attempts.  CSW will make arrangements to contact patient again on Wednesday, March 04, 2019, around 9:00AM, to continue to try and offer counseling and supportive services, as well as grief and loss counseling for patient's symptoms of depression and grief over the loss of her boyfriend in January of 2020.  At  present, patient continues to receive home health physical therapy, occupational therapy, speech therapy, nursing, social work and aide services, all through [facility].  On all accounts, patient appears to be working well with home health services and is receptive to home recommendations and modifications.  CSW will broach  in-home aide services with patient again, in the hopes that she will be agreeable to having CSW assist her with arranging assistance in the home, once [facility] has terminated services.  CSW will remain in constant communication with [name], as well as [name], to offer social work assistance.  [name], BSW, MSW, LCSW  Licensed Education officer, environmental Health System  [address]ailing [place] [address] [place], [place] [address] Physical Address-[address], [place], [place] [address] Toll Free Main # [phone] Fax # [phone] Cell # [phone]  Office # [phone] [name]

## 2019-02-28 NOTE — Telephone Encounter (Signed)
Spoke with Tiffany [name] 02/25/2019 at [facility] she was going to talk to her Social Worker about helping talk to [name] about placement looking at note 02/26/2019 the Social worker talked to both [name] and her son at this time both stated not at this time, in the [facility] notes with concerns of APS they stated that they would let the home health handle that if needed because they are going into the home at this time, today 02/28/2019 we got a call from home health that this week pt only had speech therapy once.

## 2019-02-28 NOTE — Patient Outreach (Signed)
[name]) Care Management  02/28/2019  [name] [birth year] [id number]   Subjective: Telephone call to [name] at [name] office, advised of all my conversations regarding patient from 02/25/2019 -02/28/2019 with the following: [name] ([place] Management Pharmacist), [name] ([place] Management Assistant Clinical Director), [name] ( [facility] Nurse Case Manager), [name] ([facility] Social Worker), and [name] ( [place] Management Social Worker). Case status discussed and next step, will await [facility] Social Workers evaluation.   [name] states [name] is in agreement that Adult Protective Services  (APS) referral would need to be initiated by home health agency if appropriate due to the agency being able to visualize and hear what is going on with the patient since they are providing in home care.   RNCM verbally gave [name] contact information for home health social worker.  RNCM advised will close patient out as no contact if no return call from patient by 03/04/2019.  RNCM advised will follow up with home health social worker and [facility] social worker next week to determine status of APS referral.     Objective:Per KPN (Knowledge Performance Now, point of care tool) and chart review,patient has not had any recent hospitalizations or ED visits. Patient also has a history of hyperlipidemia, CAD, AICD (automatic cardioverter/defibrillator),Aortic stenosis,Bicuspid aortic valve,Benign neoplasm of colon,Depression, hypertension, andMemory changes.     Assessment: Received [employer] health plan referral on 02/06/2019. Referral source: [place] [name]([phone] ext. [phone]). Referral reason:Dementia, Limited support system, may need physical therapy.Screening follow up completed, patienthas been referredto [facility] for hypertension education / management, Pharmacy for medication management,  Social Worker to follow up on depression screening tool results, and provide resources.[name] referral closed at this time, RNCMhasverifiedstatus of home health referral and will follow for home health care coordination.Home health care coordination assessment completionpending patient contact.    Plan:RNCM will follow up with home health and [facility] social worker within 4 business days to verify status of Adult [employer] referral and determine next steps within 4 business days.  RNCM will call [name] at [name] office to give update on Adult Protective Services referral and determine next steps within 6 business days.  RNCM will proceed with case closure, within 10 business days if no return call.     [name] H. Annia Friendly, BSN, [place] Management [facility] Telephonic CM Phone: [phone] Fax: [phone]

## 2019-03-01 DIAGNOSIS — F329 Major depressive disorder, single episode, unspecified: Secondary | ICD-10-CM | POA: Diagnosis not present

## 2019-03-01 DIAGNOSIS — F0390 Unspecified dementia without behavioral disturbance: Secondary | ICD-10-CM | POA: Diagnosis not present

## 2019-03-01 DIAGNOSIS — I2510 Atherosclerotic heart disease of native coronary artery without angina pectoris: Secondary | ICD-10-CM | POA: Diagnosis not present

## 2019-03-01 DIAGNOSIS — I110 Hypertensive heart disease with heart failure: Secondary | ICD-10-CM | POA: Diagnosis not present

## 2019-03-01 DIAGNOSIS — R2689 Other abnormalities of gait and mobility: Secondary | ICD-10-CM | POA: Diagnosis not present

## 2019-03-01 DIAGNOSIS — G20 Parkinson's disease: Secondary | ICD-10-CM | POA: Diagnosis not present

## 2019-03-01 DIAGNOSIS — R498 Other voice and resonance disorders: Secondary | ICD-10-CM | POA: Diagnosis not present

## 2019-03-01 DIAGNOSIS — I509 Heart failure, unspecified: Secondary | ICD-10-CM | POA: Diagnosis not present

## 2019-03-01 DIAGNOSIS — M6281 Muscle weakness (generalized): Secondary | ICD-10-CM | POA: Diagnosis not present

## 2019-03-03 ENCOUNTER — Other Ambulatory Visit: Payer: Self-pay | Admitting: Pharmacist

## 2019-03-03 ENCOUNTER — Ambulatory Visit: Payer: Self-pay | Admitting: Pharmacist

## 2019-03-03 DIAGNOSIS — R498 Other voice and resonance disorders: Secondary | ICD-10-CM | POA: Diagnosis not present

## 2019-03-03 DIAGNOSIS — I509 Heart failure, unspecified: Secondary | ICD-10-CM | POA: Diagnosis not present

## 2019-03-03 DIAGNOSIS — M6281 Muscle weakness (generalized): Secondary | ICD-10-CM | POA: Diagnosis not present

## 2019-03-03 DIAGNOSIS — I110 Hypertensive heart disease with heart failure: Secondary | ICD-10-CM | POA: Diagnosis not present

## 2019-03-03 DIAGNOSIS — I2510 Atherosclerotic heart disease of native coronary artery without angina pectoris: Secondary | ICD-10-CM | POA: Diagnosis not present

## 2019-03-03 DIAGNOSIS — F329 Major depressive disorder, single episode, unspecified: Secondary | ICD-10-CM | POA: Diagnosis not present

## 2019-03-03 DIAGNOSIS — R2689 Other abnormalities of gait and mobility: Secondary | ICD-10-CM | POA: Diagnosis not present

## 2019-03-03 DIAGNOSIS — G20 Parkinson's disease: Secondary | ICD-10-CM | POA: Diagnosis not present

## 2019-03-03 DIAGNOSIS — F0390 Unspecified dementia without behavioral disturbance: Secondary | ICD-10-CM | POA: Diagnosis not present

## 2019-03-03 NOTE — Patient Outreach (Signed)
[facility]) Care Management  03/03/2019  [name] 1956-01-22 [id number]   Called Dr. Versie Starks office on the patient's behalf and left a message on the Triage Nurse Line requesting new prescriptions be sent to [place].  [place]. The pharmacy confirmed Dr. Amparo Bristol office called in Carbidopa-Levodopa, Donepezil and Sertraline.  Donepezil and Sertraline was too early.  The patient's account has been set up and her medications will be packaged and delivered to her home.  Called the patient's son Tiffany Gave). HIPAA identifiers were obtained.  [name] confirmed he was in agreement with pill packs at [facility] and that he would be available to speak with the pharmacy if they had any questions.  Since the donepezil and sertraline were filled recently, they would not be in the initial pill pack.  Plan: Follow up in 3-5 business days with the pharmacy and patient.   [name], PharmD, [place] Clinical Pharmacist [phone]

## 2019-03-04 ENCOUNTER — Other Ambulatory Visit: Payer: Self-pay | Admitting: *Deleted

## 2019-03-04 DIAGNOSIS — R2689 Other abnormalities of gait and mobility: Secondary | ICD-10-CM | POA: Diagnosis not present

## 2019-03-04 DIAGNOSIS — G20 Parkinson's disease: Secondary | ICD-10-CM | POA: Diagnosis not present

## 2019-03-04 DIAGNOSIS — F0390 Unspecified dementia without behavioral disturbance: Secondary | ICD-10-CM | POA: Diagnosis not present

## 2019-03-04 DIAGNOSIS — I2510 Atherosclerotic heart disease of native coronary artery without angina pectoris: Secondary | ICD-10-CM | POA: Diagnosis not present

## 2019-03-04 DIAGNOSIS — R498 Other voice and resonance disorders: Secondary | ICD-10-CM | POA: Diagnosis not present

## 2019-03-04 DIAGNOSIS — I110 Hypertensive heart disease with heart failure: Secondary | ICD-10-CM | POA: Diagnosis not present

## 2019-03-04 DIAGNOSIS — M6281 Muscle weakness (generalized): Secondary | ICD-10-CM | POA: Diagnosis not present

## 2019-03-04 DIAGNOSIS — F329 Major depressive disorder, single episode, unspecified: Secondary | ICD-10-CM | POA: Diagnosis not present

## 2019-03-04 DIAGNOSIS — I509 Heart failure, unspecified: Secondary | ICD-10-CM | POA: Diagnosis not present

## 2019-03-04 NOTE — Patient Outreach (Addendum)
[facility]) Care Management  03/04/2019  [name] 08-Mar-1956 [id number]   Subjective: Received voicemail message from [name] states she is currently at patient's home, patient is willing to talk, and requested call back.  Telephone call to [name] Worker at [facility] [phone]), no answer, left HIPAA compliant voicemail message, and requested call back. Telephone call to [name] at Dr. Amparo Bristol office answering service, states office closed at 4:30pm, left HIPAA compliant message for [name], and requested call back.   Telephone call to [name] at [place] Management, left message, and requested call back.      Objective:Per KPN (Knowledge Performance Now, point of care tool) and chart review,patient has not had any recent hospitalizations or ED visits. Patient also has a history of hyperlipidemia, CAD, AICD (automatic cardioverter/defibrillator),Aortic stenosis,Bicuspid aortic valve,Benign neoplasm of colon,Depression, hypertension, andMemory changes.     Assessment: Received [employer] health plan referral on 02/06/2019. Referral source: [name] ext. [phone]). Referral reason:Dementia, Limited support system, may need physical therapy.Screening follow up completed, patienthas been referredto [facility] for hypertension education / management, Pharmacy for medication management, Social Worker to follow up on depression screening tool results, and provide resources.[name] referral closed at this time, RNCMhasverifiedstatus of home health referral and  home health care coordination completed.Per chart review, per home health social worker assessment, patient is safe in home and no adult protective services referral needed.  Home health care coordination assessment not completiondue to patient unable to contact and RNCM will proceed with case closure.   Patient will continue  to have pharmacy and social worker Hand Management services.        Plan:Case care coordination closure due to unable to reach.  RNCM will send MD case closure letter.      [name] H. Annia Friendly, BSN, [place] Management [facility] Telephonic CM Phone: [phone] Fax: [phone]

## 2019-03-05 ENCOUNTER — Other Ambulatory Visit: Payer: Self-pay | Admitting: *Deleted

## 2019-03-05 DIAGNOSIS — I2510 Atherosclerotic heart disease of native coronary artery without angina pectoris: Secondary | ICD-10-CM | POA: Diagnosis not present

## 2019-03-05 DIAGNOSIS — R2689 Other abnormalities of gait and mobility: Secondary | ICD-10-CM | POA: Diagnosis not present

## 2019-03-05 DIAGNOSIS — I509 Heart failure, unspecified: Secondary | ICD-10-CM | POA: Diagnosis not present

## 2019-03-05 DIAGNOSIS — F0390 Unspecified dementia without behavioral disturbance: Secondary | ICD-10-CM | POA: Diagnosis not present

## 2019-03-05 DIAGNOSIS — R498 Other voice and resonance disorders: Secondary | ICD-10-CM | POA: Diagnosis not present

## 2019-03-05 DIAGNOSIS — I110 Hypertensive heart disease with heart failure: Secondary | ICD-10-CM | POA: Diagnosis not present

## 2019-03-05 DIAGNOSIS — M6281 Muscle weakness (generalized): Secondary | ICD-10-CM | POA: Diagnosis not present

## 2019-03-05 DIAGNOSIS — F329 Major depressive disorder, single episode, unspecified: Secondary | ICD-10-CM | POA: Diagnosis not present

## 2019-03-05 DIAGNOSIS — G20 Parkinson's disease: Secondary | ICD-10-CM | POA: Diagnosis not present

## 2019-03-05 NOTE — Patient Outreach (Signed)
[facility]) Care Management  03/05/2019  [name] 11-Nov-1955 [id number]   CSW made an attempt to try and contact patient today to follow-up regarding social work services and resources; however, patient was unavailable at the time of CSW's call.  A HIPAA compliant message was left for patient on voicemail.  CSW is currently awaiting a return call.  CSW will make a second outreach attempt within the next 3-4 business days, if a return call is not received from patient in the meantime.    [name], BSW, MSW, LCSW  Licensed Education officer, environmental Health System  Mailing [place] [address] [place], [place] [address] Physical Address-[address], [place], [place] [address] Toll Free Main # [phone] Fax # [phone] Cell # [phone]  Office # [phone] [name]

## 2019-03-05 NOTE — Patient Outreach (Signed)
[facility]) Care Management  03/05/2019  [name] [birth year] [id number]   Received telephone call from [place] at Dr. Amparo Bristol office,states she is returning RNCM's call, and she has not received an update from home health social worker [name] [place]).  RNCM advised [name] of the following: patient has not returned calls to this [facility] and case closed due to unable to contact.  Advised patient continues to be followed by [place] [facility] Social Worker for grief counseling resources and by [place] Management Pharmacist for medication management and pill pack program.   Patient continues to be followed by home health and home health social worker [name]) who states patient is safe in her house and no Adult Protective Services referral is needed at this time. [name] appreciative of update and will update Dr. Delice Lesch regarding this conversation.     Manasi Dishon H. Annia Friendly, BSN, [place] [facility] Telephonic CM Phone: [phone] Fax: [phone]

## 2019-03-05 NOTE — Telephone Encounter (Signed)
Darlene Cooper left msg with after hours about needing to speak with you on this. Please call her back at [phone]. Thanks!

## 2019-03-06 DIAGNOSIS — F0390 Unspecified dementia without behavioral disturbance: Secondary | ICD-10-CM | POA: Diagnosis not present

## 2019-03-06 DIAGNOSIS — F329 Major depressive disorder, single episode, unspecified: Secondary | ICD-10-CM | POA: Diagnosis not present

## 2019-03-06 DIAGNOSIS — M6281 Muscle weakness (generalized): Secondary | ICD-10-CM | POA: Diagnosis not present

## 2019-03-06 DIAGNOSIS — I509 Heart failure, unspecified: Secondary | ICD-10-CM | POA: Diagnosis not present

## 2019-03-06 DIAGNOSIS — R498 Other voice and resonance disorders: Secondary | ICD-10-CM | POA: Diagnosis not present

## 2019-03-06 DIAGNOSIS — I2510 Atherosclerotic heart disease of native coronary artery without angina pectoris: Secondary | ICD-10-CM | POA: Diagnosis not present

## 2019-03-06 DIAGNOSIS — R2689 Other abnormalities of gait and mobility: Secondary | ICD-10-CM | POA: Diagnosis not present

## 2019-03-06 DIAGNOSIS — G20 Parkinson's disease: Secondary | ICD-10-CM | POA: Diagnosis not present

## 2019-03-06 DIAGNOSIS — I110 Hypertensive heart disease with heart failure: Secondary | ICD-10-CM | POA: Diagnosis not present

## 2019-03-07 ENCOUNTER — Telehealth: Payer: Self-pay | Admitting: Neurology

## 2019-03-07 ENCOUNTER — Other Ambulatory Visit: Payer: Self-pay | Admitting: Pharmacist

## 2019-03-07 DIAGNOSIS — M6281 Muscle weakness (generalized): Secondary | ICD-10-CM | POA: Diagnosis not present

## 2019-03-07 DIAGNOSIS — F0390 Unspecified dementia without behavioral disturbance: Secondary | ICD-10-CM | POA: Diagnosis not present

## 2019-03-07 DIAGNOSIS — I2510 Atherosclerotic heart disease of native coronary artery without angina pectoris: Secondary | ICD-10-CM | POA: Diagnosis not present

## 2019-03-07 DIAGNOSIS — I110 Hypertensive heart disease with heart failure: Secondary | ICD-10-CM | POA: Diagnosis not present

## 2019-03-07 DIAGNOSIS — F329 Major depressive disorder, single episode, unspecified: Secondary | ICD-10-CM | POA: Diagnosis not present

## 2019-03-07 DIAGNOSIS — I509 Heart failure, unspecified: Secondary | ICD-10-CM | POA: Diagnosis not present

## 2019-03-07 DIAGNOSIS — R2689 Other abnormalities of gait and mobility: Secondary | ICD-10-CM | POA: Diagnosis not present

## 2019-03-07 DIAGNOSIS — R498 Other voice and resonance disorders: Secondary | ICD-10-CM | POA: Diagnosis not present

## 2019-03-07 DIAGNOSIS — G20 Parkinson's disease: Secondary | ICD-10-CM | POA: Diagnosis not present

## 2019-03-07 NOTE — Telephone Encounter (Signed)
Pain is a 10 this morning in her lower back. Please call the case Nurse for this patient from [facility] back at [phone]. Thanks!

## 2019-03-07 NOTE — Telephone Encounter (Signed)
Ok, thanks.

## 2019-03-07 NOTE — Patient Outreach (Signed)
[place] [facility]) Care Management  03/07/2019  [name] 11-14-1955 [id number]   [place] to check on the status of the patient's pill packs. The pharmacist said all of the patient's chronic medications had been called except Aspirin and Rosuvastatin.  He also said due to insurance, the patient's pill packs could not be filled until 03/19/2019.  Called Dr. Versie Starks office and requested new prescriptions for Aspirin and Rosuvastatin be called in to [facility].  Spoke with the patient's son [name]). HIPAA identifiers were obtained. Let [name] know the status of the pill packs. He confirmed his mother had enough medication to last her until 03/19/2019.  Plan: Follow up in 7-10 business days.   [name], PharmD, [place] Clinical Pharmacist [phone]

## 2019-03-07 NOTE — Telephone Encounter (Signed)
Spoke with Lovey Newcomer from [facility] Mrs Bigler will only have Speech once this week

## 2019-03-07 NOTE — Telephone Encounter (Signed)
[place] calling,she needs verbal orders to change speach therapy for pt.

## 2019-03-07 NOTE — Telephone Encounter (Signed)
Spoke with Millie Case Worker for [facility] advised to call Dr. Lauretta Grill about Mrs Garant Back pain, Our office does not prescribe narcotics, pt does take tylenol for pain and hasnt taken it today Millie stated at she would get Mrs Hammac to take her Tylenol to see if it helps any an that they would call her PCP

## 2019-03-08 DIAGNOSIS — R2689 Other abnormalities of gait and mobility: Secondary | ICD-10-CM | POA: Diagnosis not present

## 2019-03-08 DIAGNOSIS — F0390 Unspecified dementia without behavioral disturbance: Secondary | ICD-10-CM | POA: Diagnosis not present

## 2019-03-08 DIAGNOSIS — G20 Parkinson's disease: Secondary | ICD-10-CM | POA: Diagnosis not present

## 2019-03-08 DIAGNOSIS — I2510 Atherosclerotic heart disease of native coronary artery without angina pectoris: Secondary | ICD-10-CM | POA: Diagnosis not present

## 2019-03-08 DIAGNOSIS — M6281 Muscle weakness (generalized): Secondary | ICD-10-CM | POA: Diagnosis not present

## 2019-03-08 DIAGNOSIS — I509 Heart failure, unspecified: Secondary | ICD-10-CM | POA: Diagnosis not present

## 2019-03-08 DIAGNOSIS — R498 Other voice and resonance disorders: Secondary | ICD-10-CM | POA: Diagnosis not present

## 2019-03-08 DIAGNOSIS — I110 Hypertensive heart disease with heart failure: Secondary | ICD-10-CM | POA: Diagnosis not present

## 2019-03-08 DIAGNOSIS — F329 Major depressive disorder, single episode, unspecified: Secondary | ICD-10-CM | POA: Diagnosis not present

## 2019-03-10 DIAGNOSIS — F329 Major depressive disorder, single episode, unspecified: Secondary | ICD-10-CM | POA: Diagnosis not present

## 2019-03-10 DIAGNOSIS — I2510 Atherosclerotic heart disease of native coronary artery without angina pectoris: Secondary | ICD-10-CM | POA: Diagnosis not present

## 2019-03-10 DIAGNOSIS — I110 Hypertensive heart disease with heart failure: Secondary | ICD-10-CM | POA: Diagnosis not present

## 2019-03-10 DIAGNOSIS — R2689 Other abnormalities of gait and mobility: Secondary | ICD-10-CM | POA: Diagnosis not present

## 2019-03-10 DIAGNOSIS — G20 Parkinson's disease: Secondary | ICD-10-CM | POA: Diagnosis not present

## 2019-03-10 DIAGNOSIS — F0390 Unspecified dementia without behavioral disturbance: Secondary | ICD-10-CM | POA: Diagnosis not present

## 2019-03-10 DIAGNOSIS — M6281 Muscle weakness (generalized): Secondary | ICD-10-CM | POA: Diagnosis not present

## 2019-03-10 DIAGNOSIS — R498 Other voice and resonance disorders: Secondary | ICD-10-CM | POA: Diagnosis not present

## 2019-03-10 DIAGNOSIS — I509 Heart failure, unspecified: Secondary | ICD-10-CM | POA: Diagnosis not present

## 2019-03-11 ENCOUNTER — Other Ambulatory Visit: Payer: Self-pay | Admitting: *Deleted

## 2019-03-11 ENCOUNTER — Encounter: Payer: Self-pay | Admitting: *Deleted

## 2019-03-11 DIAGNOSIS — I2510 Atherosclerotic heart disease of native coronary artery without angina pectoris: Secondary | ICD-10-CM | POA: Diagnosis not present

## 2019-03-11 DIAGNOSIS — R498 Other voice and resonance disorders: Secondary | ICD-10-CM | POA: Diagnosis not present

## 2019-03-11 DIAGNOSIS — F329 Major depressive disorder, single episode, unspecified: Secondary | ICD-10-CM | POA: Diagnosis not present

## 2019-03-11 DIAGNOSIS — R2689 Other abnormalities of gait and mobility: Secondary | ICD-10-CM | POA: Diagnosis not present

## 2019-03-11 DIAGNOSIS — G20 Parkinson's disease: Secondary | ICD-10-CM | POA: Diagnosis not present

## 2019-03-11 DIAGNOSIS — I509 Heart failure, unspecified: Secondary | ICD-10-CM | POA: Diagnosis not present

## 2019-03-11 DIAGNOSIS — M6281 Muscle weakness (generalized): Secondary | ICD-10-CM | POA: Diagnosis not present

## 2019-03-11 DIAGNOSIS — F0390 Unspecified dementia without behavioral disturbance: Secondary | ICD-10-CM | POA: Diagnosis not present

## 2019-03-11 DIAGNOSIS — I110 Hypertensive heart disease with heart failure: Secondary | ICD-10-CM | POA: Diagnosis not present

## 2019-03-11 NOTE — Patient Outreach (Signed)
[place] [facility]) Care Management  03/11/2019  [name] 1956/07/01 [id number]   CSW made a second attempt to try and contact patient today to follow-up regarding social work services and resources, as well as assess need for continued social work involvement; however, patient was unavailable at the time of CSW's call.  A HIPAA compliant message was left for patient on voicemail.  CSW continues to await a return call.  CSW will make a third and final outreach attempt within the next 3-4 business days, if a return call is not received from patient in the meantime.  CSW will then proceed with case closure if a return call is not received from patient with a total of 10 business days, as required number of phone attempts will have been made and outreach letter mailed.  Outreach letter mailed to patient today, requesting that patient return CSW's call at her earliest convenience if she is interested in continuing to receive social work services through [place] with [facility].  [name], BSW, MSW, LCSW  Licensed Education officer, environmental Health System  Mailing [place] [address], [place], [place] [address] Physical Address-[address], [place],  [address] Toll Free Main # [phone] Fax # [phone] Cell # [phone]  Office # [phone] [name]

## 2019-03-12 DIAGNOSIS — F329 Major depressive disorder, single episode, unspecified: Secondary | ICD-10-CM | POA: Diagnosis not present

## 2019-03-12 DIAGNOSIS — F0390 Unspecified dementia without behavioral disturbance: Secondary | ICD-10-CM | POA: Diagnosis not present

## 2019-03-12 DIAGNOSIS — M6281 Muscle weakness (generalized): Secondary | ICD-10-CM | POA: Diagnosis not present

## 2019-03-12 DIAGNOSIS — R498 Other voice and resonance disorders: Secondary | ICD-10-CM | POA: Diagnosis not present

## 2019-03-12 DIAGNOSIS — I2510 Atherosclerotic heart disease of native coronary artery without angina pectoris: Secondary | ICD-10-CM | POA: Diagnosis not present

## 2019-03-12 DIAGNOSIS — G20 Parkinson's disease: Secondary | ICD-10-CM | POA: Diagnosis not present

## 2019-03-12 DIAGNOSIS — I110 Hypertensive heart disease with heart failure: Secondary | ICD-10-CM | POA: Diagnosis not present

## 2019-03-12 DIAGNOSIS — I509 Heart failure, unspecified: Secondary | ICD-10-CM | POA: Diagnosis not present

## 2019-03-12 DIAGNOSIS — R2689 Other abnormalities of gait and mobility: Secondary | ICD-10-CM | POA: Diagnosis not present

## 2019-03-17 ENCOUNTER — Other Ambulatory Visit: Payer: Self-pay | Admitting: *Deleted

## 2019-03-17 ENCOUNTER — Ambulatory Visit: Payer: Self-pay | Admitting: Pharmacist

## 2019-03-17 NOTE — Patient Outreach (Signed)
[place] [facility]) Care Management  03/17/2019  KINSHASA THROCKMORTON [birth year] [id number]    CSW made a third and final attempt to try and contact patient today to follow-up regarding social work services and resources, without success.  A HIPAA compliant message was left for patient on voicemail.  CSW is currently awaiting a return call.  CSW will proceed with case closure in two business days, if a return call is not received in the meantime, as required number of phone attempts have been made and an outreach letter was mailed to patient's home allowing 10 business days for a response.  [name], BSW, MSW, LCSW  Licensed Education officer, environmental Health System  Mailing [place] [address], [place], [place] [address] Physical Address-[address], [place], [place] [address] Toll Free Main # [phone] Fax # [phone] Cell # [phone]  Office # [phone] [email or web address]

## 2019-03-19 ENCOUNTER — Encounter: Payer: Self-pay | Admitting: *Deleted

## 2019-03-19 ENCOUNTER — Encounter: Payer: Self-pay | Admitting: Pharmacist

## 2019-03-19 ENCOUNTER — Other Ambulatory Visit: Payer: Self-pay | Admitting: *Deleted

## 2019-03-19 ENCOUNTER — Other Ambulatory Visit: Payer: Self-pay | Admitting: Pharmacist

## 2019-03-19 NOTE — Patient Outreach (Addendum)
[place] [facility]) Care Management  03/19/2019  [name] 04/27/1956 [id number]   Patient was called to follow up on pill packs. I was able to speak with the patient and her son, [name].  [facility] will dispense pill packs today. Rosuvastatin 10mg  will not be in the packs because a 90 day supply was filled 02/18/2019.    Patient and son communicated understanding. Patient said she would be at home for the rest of the day to receive the pill packs.  Plan: Follow up with patient and son by Friday to be sure they understand how to use the packs.  Send note to Dr. Dorthy Cooler about aspirin.  [name], PharmD, [place] Clinical Pharmacist [phone]    ADDENDUM Patient called me back. She received her pill packs. We reviewed them. The patient was confused at first because she is used to taking her medications out of a bottle. She still had some medications left in the bottle and she was encouraged to separate all of her bottles and put them away with the exception of Rosuvastatin.  She communicated understanding.  Spoke with the patient's son again. [name] said he looked at the pill packs and everything looked fine.  Plan: Continue with previous follow up.  [name], PharmD, [place] Clinical Pharmacist [phone]

## 2019-03-19 NOTE — Patient Outreach (Signed)
[facility] [facility]) Care Management  03/19/2019  MYRLA MALANOWSKI [birth year] [id number]   CSW will perform a case closure on patient, due to inability to maintain phone contact with patient, despite required number of phone attempts made and outreach letter mailed to patient's home allowing 10 business days for a response.  CSW will notify patient's Pharmacist, with [place] Management, [name] of CSW's plans to close patient's case.  CSW will fax an update to patient's Primary Care Physician, Dr. Lauretta Grill [place] to ensure that they are aware of CSW's involvement with patient's plan of care.    [name], BSW, MSW, LCSW  Licensed Education officer, environmental Health System  Mailing [place] [address], [place], [place] [address] Physical Address-[address], [place], [place] [address] Toll Free Main # [phone] Fax # [phone] Cell # [phone]  Office # [phone] [email or web address]

## 2019-03-21 ENCOUNTER — Other Ambulatory Visit: Payer: Self-pay | Admitting: Pharmacist

## 2019-03-22 NOTE — Patient Outreach (Signed)
[place] [facility]) Care Management  03/22/2019  ENJOLI TIDD 1956-05-31 [id number]   Patient was called to follow up on her pill packs. HIPAA identifiers were obtained. Patient reported feeling much better about the pill packs. She said she was able to use the pack this morning and would be using it again tonight. Patient also said she was not using any of her old pill bottles with the exception of rosuvastatin. Rosuvastatin had recently been refilled at her local pharmacy and was too early to be filled by [facility] so it could be included in her pack.  Plan: Call patient back in 10-14 business days to check on her.   [name], PharmD, [place] Clinical Pharmacist [phone]

## 2019-04-02 ENCOUNTER — Ambulatory Visit (INDEPENDENT_AMBULATORY_CARE_PROVIDER_SITE_OTHER): Payer: Medicare HMO | Admitting: *Deleted

## 2019-04-02 DIAGNOSIS — I428 Other cardiomyopathies: Secondary | ICD-10-CM

## 2019-04-03 ENCOUNTER — Other Ambulatory Visit: Payer: Self-pay | Admitting: Pharmacist

## 2019-04-03 LAB — CUP PACEART REMOTE DEVICE CHECK
Battery Remaining Longevity: 36 mo
Battery Remaining Percentage: 55 %
Brady Statistic RA Percent Paced: 0 %
Brady Statistic RV Percent Paced: 100 %
Date Time Interrogation Session: 20200708050100
HighPow Impedance: 60 Ohm
Implantable Lead Implant Date: 20060630
Implantable Lead Implant Date: 20060630
Implantable Lead Implant Date: 20060630
Implantable Lead Location: 753858
Implantable Lead Location: 753859
Implantable Lead Location: 753860
Implantable Lead Model: 157
Implantable Lead Model: 4086
Implantable Lead Model: 4543
Implantable Lead Serial Number: 119717
Implantable Lead Serial Number: 131677
Implantable Lead Serial Number: 223436
Implantable Pulse Generator Implant Date: 20120131
Lead Channel Impedance Value: 1412 Ohm
Lead Channel Impedance Value: 413 Ohm
Lead Channel Impedance Value: 513 Ohm
Lead Channel Setting Pacing Amplitude: 2 V
Lead Channel Setting Pacing Amplitude: 2.2 V
Lead Channel Setting Pacing Amplitude: 2.4 V
Lead Channel Setting Pacing Pulse Width: 0.4 ms
Lead Channel Setting Pacing Pulse Width: 0.4 ms
Lead Channel Setting Sensing Sensitivity: 0.5 mV
Lead Channel Setting Sensing Sensitivity: 1 mV
Pulse Gen Serial Number: 498164

## 2019-04-03 NOTE — Patient Outreach (Signed)
[place] [facility]) Care Management  04/03/2019  [name] 1956-07-16 [id number]  Patient was called to follow up on medication adherence and her pill packs. HIPAA identifiers were obtained. Patient confirmed she is using her pill packs and is taking rosuvastatin out of the medication bottle because it was filled for a 90 day supply at another pharmacy and was too early to be filled at [facility].  Patient said she was not sure carbidopa levodopa was in the pack because she did not see a pill that looked like what she was taking before.  In addition, she said she thought she should be taking carbidopa-levodopa three times a day. The prescription from Dr. Delice Lesch listed Carbidopa/Levodopa 25/100 1 tablet twice daily.  [facility] was called and the color and markings of the Carbidopa-Levodopa 25/100 that was put in the patients back was clarified (yellow oval 518).  The pill markings were shared with the patient.  Patient also said Aspirin was not in the pill pack. Another message was left with Dr. Versie Starks office about Aspirin.   Plan: Follow up with the patient within 1 week.   [name], PharmD, [place] Clinical Pharmacist [phone]

## 2019-04-08 ENCOUNTER — Other Ambulatory Visit: Payer: Self-pay | Admitting: Pharmacist

## 2019-04-08 NOTE — Patient Outreach (Signed)
[place] [facility]) Care Management  04/08/2019  [name] 01/09/1956 [id number]   Incoming call from pcp office. Dr. Versie Starks nurse called to inquire about the message I left last week about Aspirin.  She confirmed aspirin was still on the patient's medication list but said she would check with Dr. Dorthy Cooler and get back to me for sure. If the patient is still supposed to be taking Aspirin, she will send in a new prescription to [facility].   Plan: Follow up with patient and about aspirin at previously scheduled follow up on 04/10/2019.   [name], PharmD, [place] Clinical Pharmacist [phone]

## 2019-04-10 ENCOUNTER — Ambulatory Visit: Payer: Self-pay | Admitting: Pharmacist

## 2019-04-11 ENCOUNTER — Other Ambulatory Visit: Payer: Self-pay | Admitting: Pharmacist

## 2019-04-11 NOTE — Patient Outreach (Addendum)
[place] [facility]) Care Management  04/11/2019  [name] 29-Mar-1956 [id number]   Called patient to follow up on pill packs and aspirin.  HIPAA identifiers were obtained.  Pill packs due to be filled 03/17/2019 according to [facility].  Dr. Theone Murdoch office called Asprin in on 03/10/2019  [name], PharmD, [place] Clinical Pharmacist [phone]

## 2019-04-14 ENCOUNTER — Encounter: Payer: Self-pay | Admitting: Cardiology

## 2019-04-14 NOTE — Progress Notes (Signed)
Remote ICD transmission.   

## 2019-04-16 ENCOUNTER — Other Ambulatory Visit: Payer: Self-pay | Admitting: Pharmacist

## 2019-04-16 NOTE — Patient Outreach (Signed)
[facility]) Care Management  04/16/2019  CREE NAPOLI 03-29-1956 [id number]   Patient was called to follow up on pill packs. HIPAA identifiers were obtained. Patient confirmed she received her packs today.  She also said she was trying to "figure them out".  She said she had an urge to pop all the pills out of the pack to try to sort them.  She was cautioned not to do that.  [facility] was called on the patient's behalf to discuss the contents of her packs.  The technician said the following medications were dispensed in the patient's packs:  Olmesartan, Aspirin, Furosemide, Carvedilol, Sertraline, Carbidopa, and Donepezil.  Patient wondered if she should begin the new packs today. She was instructed to finish her current pack out until Saturday and start the new pack on Sunday.  Plan: Call the patient back in 2 weeks to check on her.    [name], PharmD, [place] Clinical Pharmacist [phone]

## 2019-04-17 ENCOUNTER — Telehealth: Payer: Self-pay | Admitting: Neurology

## 2019-04-17 DIAGNOSIS — M549 Dorsalgia, unspecified: Secondary | ICD-10-CM

## 2019-04-17 NOTE — Telephone Encounter (Signed)
Patient stated has had back pain since Easter and it is bothering her more now than in the past. Her PT (via [facility]) stopped a few weeks ago. Inquired if she was taking the Tylenol for back pain and she said she didn't think that helped so stopped taking that. She called her PCP (Dr. Dorthy Cooler) last Friday about this back pain and stated they told her to call Dr. Amparo Bristol office. Her pain is 2-3/10 if lying down and 8/10 if moving around. Informed patient would make Dr. Delice Lesch aware and get back with her with any recommendations.

## 2019-04-17 NOTE — Telephone Encounter (Signed)
Patient is calling in about still having back pain and its severe. Please call her back at [phone]. Thanks!

## 2019-04-17 NOTE — Telephone Encounter (Signed)
I have not seen her for back pain, and we do not see back pain patients. Usually PCP, but in her case, looks like we will have to refer to Ortho for back pain. Pls send referral to [facility], thanks

## 2019-04-18 NOTE — Telephone Encounter (Signed)
Called patient no answer left message that referral for orthopedic was sent for her. Also patient should contacr PCP regarding back pain, Dr. Delice Lesch does not see patient with back pain.

## 2019-04-18 NOTE — Telephone Encounter (Signed)
Referral fax to [phone] Office # [phone]

## 2019-04-28 ENCOUNTER — Other Ambulatory Visit: Payer: Self-pay | Admitting: Pharmacist

## 2019-04-28 NOTE — Patient Outreach (Signed)
[facility]) Care Management  04/28/2019  [name] [birth year] [id number]   Patient was called regarding medication adherence with her pill packs. HIPAA identifiers were obtained. Patient reported feeling well today and that she was not having any issues with her medication packs.  Patient's packs are filled and delivered by [facility].  Plan: Call patient back in 3 weeks for follow up.   [name], PharmD, [place] Clinical Pharmacist [phone]

## 2019-05-12 DIAGNOSIS — M545 Low back pain: Secondary | ICD-10-CM | POA: Diagnosis not present

## 2019-05-19 ENCOUNTER — Encounter: Payer: Self-pay | Admitting: Pharmacist

## 2019-05-19 ENCOUNTER — Ambulatory Visit: Payer: Medicare HMO | Admitting: Endocrinology

## 2019-05-19 ENCOUNTER — Other Ambulatory Visit: Payer: Self-pay | Admitting: Pharmacist

## 2019-05-19 NOTE — Patient Outreach (Signed)
[facility]) Care Management  05/19/2019  [name] August 28, 1956 [id number]   Patient was called regarding medication adherence.  [name] was also called and they verified the patient's pill packs were filled 05/15/19.  The following medications were in the packs:  -Aspirin -Carbidopa/Levodopa -Carvedilol -Donepezil -Furosemide -Benicar -Rosuvastatin -Sertraline   Patient reported she received her pill packs and did not have any issues managing her medications.   Plan: Call patient back in 3 months for follow up.   [name], PharmD, [place] Clinical Pharmacist [phone]

## 2019-05-22 ENCOUNTER — Telehealth: Payer: Self-pay | Admitting: Neurology

## 2019-05-22 NOTE — Telephone Encounter (Signed)
Wanting to see feelings about the memory care unit living. She said they are trying to get the ball rolling with getting her into a facility. Please call her back and let her know Dr. Amparo Bristol opinion on this and if there is a certain place suggested. She is wanting to get as much advice as possible. Thanks!

## 2019-05-23 NOTE — Telephone Encounter (Signed)
List of Long term care facilities in [facility] mailed to pt's home address.

## 2019-05-23 NOTE — Telephone Encounter (Signed)
I completely agree with Memory Care. There are different places, it just depends on what if a good fit for her, they would have to tour them and see. We can give a list of places, if they want. Thanks

## 2019-05-23 NOTE — Telephone Encounter (Signed)
What facility is best for pt?

## 2019-05-23 NOTE — Telephone Encounter (Signed)
Where is the list?

## 2019-05-29 ENCOUNTER — Telehealth: Payer: Self-pay | Admitting: Neurology

## 2019-05-29 NOTE — Telephone Encounter (Signed)
Patient called and left a message with the after hours answering service 05/28/2019 at 5:30 PM. Caller states she would like her doctor to call back about a previous conversation regarding a long term facility physician

## 2019-05-30 NOTE — Telephone Encounter (Signed)
Called patient back. States the reason she is calling is because of the virus going on, has not gotten in contact with someone to look at her back, still suffering with her back pain. Wondering if I knew the doctor she had for her back, Dr. Venetia Maxon(?). She will call her friend to ask the name of the back doctor to send referral.

## 2019-06-11 DIAGNOSIS — M545 Low back pain: Secondary | ICD-10-CM | POA: Diagnosis not present

## 2019-06-16 ENCOUNTER — Ambulatory Visit: Payer: Medicare HMO | Admitting: Neurology

## 2019-06-18 DIAGNOSIS — D72819 Decreased white blood cell count, unspecified: Secondary | ICD-10-CM | POA: Diagnosis not present

## 2019-06-18 DIAGNOSIS — Z23 Encounter for immunization: Secondary | ICD-10-CM | POA: Diagnosis not present

## 2019-06-23 ENCOUNTER — Telehealth: Payer: Self-pay | Admitting: Endocrinology

## 2019-06-23 NOTE — Telephone Encounter (Signed)
please contact patient: Ov is due

## 2019-06-23 NOTE — Telephone Encounter (Signed)
Routing this message to the front desk for scheduling purposes. 

## 2019-06-23 NOTE — Telephone Encounter (Signed)
LMTCB to schedule appointment °

## 2019-06-23 NOTE — Telephone Encounter (Signed)
Patient will be calling us back one she has her calendar.

## 2019-06-24 ENCOUNTER — Other Ambulatory Visit: Payer: Self-pay

## 2019-06-24 NOTE — Telephone Encounter (Signed)
Patient is scheduled for appointment on 06/25/19 at 11:00 a.m.

## 2019-06-25 ENCOUNTER — Encounter: Payer: Self-pay | Admitting: Endocrinology

## 2019-06-25 ENCOUNTER — Other Ambulatory Visit: Payer: Self-pay | Admitting: Family Medicine

## 2019-06-25 ENCOUNTER — Ambulatory Visit (INDEPENDENT_AMBULATORY_CARE_PROVIDER_SITE_OTHER): Payer: Medicare HMO | Admitting: Endocrinology

## 2019-06-25 VITALS — BP 120/80 | HR 75 | Ht 63.0 in | Wt 129.2 lb

## 2019-06-25 DIAGNOSIS — I739 Peripheral vascular disease, unspecified: Secondary | ICD-10-CM

## 2019-06-25 DIAGNOSIS — G8929 Other chronic pain: Secondary | ICD-10-CM | POA: Diagnosis not present

## 2019-06-25 DIAGNOSIS — E210 Primary hyperparathyroidism: Secondary | ICD-10-CM | POA: Diagnosis not present

## 2019-06-25 DIAGNOSIS — M545 Low back pain, unspecified: Secondary | ICD-10-CM | POA: Insufficient documentation

## 2019-06-25 LAB — VITAMIN D 25 HYDROXY (VIT D DEFICIENCY, FRACTURES): VITD: 33.46 ng/mL (ref 30.00–100.00)

## 2019-06-25 NOTE — Patient Instructions (Addendum)
Blood tests are requested for you today.  Also, let's check some X-rays.  We'll let you know about the results.   Please come back for a follow-up appointment in 1 year.

## 2019-06-25 NOTE — Progress Notes (Signed)
Subjective:    Patient ID: [name], female    DOB: 1956/08/02, 63 y.o.   MRN: [id number]  HPI Pt returns for f/u of primary hyperparathyroidism (dx'ed 2017 (it was normal in 2016); she has never had urolithiasis; she does not take vitamin-D supplement; these labs were normal: 25-OH vit-D, 24-HR urine Ca++,1,25-OH vit-D, TSH, phos, Mg++, and BMD; on dexa (2019), worst T-score in 2019 was -1.5 (L-spine). She reports 5 mos of moderate pain at the lower back, and assoc fatigue.   Past Medical History:  Diagnosis Date  . Abnormal liver function tests   . AICD (automatic cardioverter/defibrillator) present    [employer]  . Anxiety   . Aortic stenosis   . Benign neoplasm of colon 04/19/2001   Hyperplastic  . Bicuspid aortic valve   . Cardiomyopathy secondary   . CHF (congestive heart failure) ([place])   . Coronary artery disease   . Depression   . Headache   . Heart murmur   . HTN (hypertension)   . Hypercalcemia   . Hyperlipidemia   . Memory changes 06/2018   in the last year /per the brother  . S/P ICD (internal cardiac defibrillator) procedure 2018    Past Surgical History:  Procedure Laterality Date  . AORTIC VALVE REPLACEMENT N/A 11/16/2016   Procedure: AORTIC VALVE REPLACEMENT (AVR);  Surgeon: Gaye Pollack, MD;  Location: [facility];  Service: Open Heart Surgery;  Laterality: N/A;  42mm Perimount Magna Ease Pericardial Bioprosthesis  . BREAST EXCISIONAL BIOPSY Left 1984   benign  . CARDIAC CATHETERIZATION N/A 07/05/2016   Procedure: Right/Left Heart Cath and Coronary Angiography;  Surgeon: Burnell Blanks, MD;  Location: [facility];  Service: Cardiovascular;  Laterality: N/A;  . CHOLECYSTECTOMY    . implantation of a Guidant biventricular ICD    . laparoscopic cholecystectomy with intraoperative cholangiogram  2003  . TEE WITHOUT CARDIOVERSION N/A 11/16/2016   Procedure: TRANSESOPHAGEAL ECHOCARDIOGRAM (TEE);  Surgeon: Gaye Pollack, MD;   Location: [facility];  Service: Open Heart Surgery;  Laterality: N/A;    Social History   Socioeconomic History  . Marital status: Single    Spouse name: Not on file  . Number of children: 1  . Years of education: Not on file  . Highest education level: Not on file  Occupational History  . Occupation: Geophysicist/field seismologist: UNEMPLOYED  Social Needs  . Financial resource strain: Not on file  . Food insecurity    Worry: Not on file    Inability: Not on file  . Transportation needs    Medical: Not on file    Non-medical: Not on file  Tobacco Use  . Smoking status: Former Smoker    Packs/day: 0.25    Years: 40.00    Pack years: 10.00    Types: Cigarettes    Quit date: 04/2018    Years since quitting: 1.1  . Smokeless tobacco: Never Used  Substance and Sexual Activity  . Alcohol use: Yes    Alcohol/week: 0.0 standard drinks    Comment: occasional  . Drug use: No  . Sexual activity: Not on file  Lifestyle  . Physical activity    Days per week: Not on file    Minutes per session: Not on file  . Stress: Not on file  Relationships  . Social Herbalist on phone: Not on file    Gets together: Not on file    Attends  religious service: Not on file    Active member of club or organization: Not on file    Attends meetings of clubs or organizations: Not on file    Relationship status: Not on file  . Intimate partner violence    Fear of current or ex partner: Not on file    Emotionally abused: Not on file    Physically abused: Not on file    Forced sexual activity: Not on file  Other Topics Concern  . Not on file  Social History Narrative  . Not on file    Current Outpatient Medications on File Prior to Visit  Medication Sig Dispense Refill  . acetaminophen (TYLENOL) 325 MG tablet Take 325 mg by mouth every 6 (six) hours as needed for mild pain.    Marland Kitchen aspirin EC 81 MG tablet Take 1 tablet (81 mg total) daily by mouth. 90 tablet 3  . carbidopa-levodopa  (SINEMET IR) 25-100 MG tablet Take 1 tablet twice a day at breakfast and dinner 60 tablet 11  . carvedilol (COREG) 12.5 MG tablet Take 1 tablet by mouth 2 (two) times a day. With food    . donepezil (ARICEPT) 10 MG tablet Take 1/2 tablet daily for 2 weeks, then increase to 1 tablet daily 30 tablet 11  . furosemide (LASIX) 20 MG tablet Take 1 tablet by mouth daily.    . Multiple Vitamin (MULTIVITAMIN WITH MINERALS) TABS tablet Take 1 tablet by mouth at bedtime.    Marland Kitchen olmesartan (BENICAR) 40 MG tablet Take 0.5 tablets (20 mg total) by mouth daily. (Patient taking differently: Take 40 mg by mouth daily. ) 45 tablet 3  . Polyethyl Glycol-Propyl Glycol (SYSTANE OP) Place 1-2 drops into both eyes 3 (three) times daily as needed (for dry eyes.).    Marland Kitchen polyethylene glycol (MIRALAX / GLYCOLAX) packet Take 17 g by mouth daily. 14 each 0  . rosuvastatin (CRESTOR) 10 MG tablet Take 1 tablet (10 mg total) by mouth daily. 90 tablet 3  . sertraline (ZOLOFT) 100 MG tablet Take 1 tablet (100 mg total) by mouth daily. 30 tablet 11   No current facility-administered medications on file prior to visit.     Allergies  Allergen Reactions  . Trospium Chloride Er Other (See Comments)    Constipation     Family History  Problem Relation Age of Onset  . Pancreatic cancer Mother   . Colon cancer Maternal Aunt   . Bone cancer Brother   . Heart attack Brother   . Hypercalcemia Neg Hx     BP 120/80 (BP Location: Left Arm, Patient Position: Sitting, Cuff Size: Normal)   Pulse 75   Ht 5\' 3"  (1.6 m)   Wt 129 lb 3.2 oz (58.6 kg)   SpO2 97%   BMI 22.89 kg/m   Review of Systems She has constipation.      Objective:   Physical Exam VITAL SIGNS:  See vs page GENERAL: no distress.  CHEST WALL: no kyphosis.   GAIT: normal and steady.    Lab Results  Component Value Date   PTH 63 06/25/2019   CALCIUM 11.6 (H) 06/25/2019   CAION 1.32 11/17/2016   PHOS 2.5 02/16/2016       Assessment & Plan:  Back pain,  new, uncertain etiology Hyperparathyroidism: resolved, but hypercalcemia is worse.   Patient Instructions  Blood tests are requested for you today.  Also, let's check some X-rays.  We'll let you know about the results.   Please come  back for a follow-up appointment in 1 year.

## 2019-06-26 LAB — PTH, INTACT AND CALCIUM
Calcium: 11.6 mg/dL — ABNORMAL HIGH (ref 8.6–10.4)
PTH: 63 pg/mL (ref 14–64)

## 2019-07-02 ENCOUNTER — Ambulatory Visit (INDEPENDENT_AMBULATORY_CARE_PROVIDER_SITE_OTHER): Payer: HMO | Admitting: *Deleted

## 2019-07-02 DIAGNOSIS — I472 Ventricular tachycardia, unspecified: Secondary | ICD-10-CM

## 2019-07-02 DIAGNOSIS — I5022 Chronic systolic (congestive) heart failure: Secondary | ICD-10-CM | POA: Diagnosis not present

## 2019-07-03 ENCOUNTER — Telehealth: Payer: Self-pay

## 2019-07-03 LAB — CUP PACEART REMOTE DEVICE CHECK
Battery Remaining Longevity: 30 mo
Battery Remaining Percentage: 53 %
Brady Statistic RA Percent Paced: 0 %
Brady Statistic RV Percent Paced: 100 %
Date Time Interrogation Session: 20201008142804
HighPow Impedance: 58 Ohm
Implantable Lead Implant Date: 20060630
Implantable Lead Implant Date: 20060630
Implantable Lead Implant Date: 20060630
Implantable Lead Location: 753858
Implantable Lead Location: 753859
Implantable Lead Location: 753860
Implantable Lead Model: 157
Implantable Lead Model: 4086
Implantable Lead Model: 4543
Implantable Lead Serial Number: 119717
Implantable Lead Serial Number: 131677
Implantable Lead Serial Number: 223436
Implantable Pulse Generator Implant Date: 20120131
Lead Channel Impedance Value: 1455 Ohm
Lead Channel Impedance Value: 423 Ohm
Lead Channel Impedance Value: 525 Ohm
Lead Channel Pacing Threshold Amplitude: 1 V
Lead Channel Pacing Threshold Amplitude: 1.2 V
Lead Channel Pacing Threshold Amplitude: 1.2 V
Lead Channel Pacing Threshold Pulse Width: 0.4 ms
Lead Channel Pacing Threshold Pulse Width: 0.4 ms
Lead Channel Pacing Threshold Pulse Width: 0.4 ms
Lead Channel Setting Pacing Amplitude: 2 V
Lead Channel Setting Pacing Amplitude: 2.2 V
Lead Channel Setting Pacing Amplitude: 2.4 V
Lead Channel Setting Pacing Pulse Width: 0.4 ms
Lead Channel Setting Pacing Pulse Width: 0.4 ms
Lead Channel Setting Sensing Sensitivity: 0.5 mV
Lead Channel Setting Sensing Sensitivity: 1 mV
Pulse Gen Serial Number: 498164

## 2019-07-03 NOTE — Telephone Encounter (Signed)
Reviewed transmission, normal device function Tried to call patient, left message  Chanetta Marshall, NP 07/03/2019 3:01 PM

## 2019-07-03 NOTE — Telephone Encounter (Signed)
The pt states she has been feeling very tired and depressed lately. Overall the pt states she has not been feeling well. The pt monitor did send a transmission today. I told her I will have a nurse review her transmission and give her a call back. The best number for the pt is [phone].

## 2019-07-09 NOTE — Telephone Encounter (Signed)
Attempted to return call to patient, direct to VM. VM is identified as belonging to "Renee". Did not LM.

## 2019-07-11 NOTE — Progress Notes (Signed)
Remote ICD transmission.   

## 2019-07-14 ENCOUNTER — Telehealth: Payer: Self-pay | Admitting: Neurology

## 2019-07-14 NOTE — Telephone Encounter (Signed)
Able to reach patient. Pt reports she feels "strange". She is unable to describe symptoms further, reports she just does not feel right, more fatigued. Ongoing for months. Denies SOB, dizziness, palpitations, chest discomfort, syncope, or other cardiac symptoms. She has not discussed with PCP. She reports compliance with cardiac medications, BP cuff not working so she does not have any recent BPs.  Explained Latitude transmission from 07/03/19 shows normal device function. BiVP 100%, 1 NSVT on 06/13/19, 5 beats duration. No atrial arrhythmias. Histograms appropriate. Encouraged pt to contact PCP for recommendations as symptoms do not seem to necessarily be cardiac in nature. Pt verbalizes understanding, agrees to call tomorrow. No further questions at this time.

## 2019-07-14 NOTE — Telephone Encounter (Signed)
Left message for pt to return call.

## 2019-07-14 NOTE — Telephone Encounter (Signed)
Patient left msg with after hours wanting to speak with the nurse. Thanks!

## 2019-07-15 NOTE — Telephone Encounter (Signed)
Patient [name] regarding her having a question regarding Insurance. She said that she had spoke with Dr. Delice Lesch at her last visit regarding her Medicare. Please Call. Thanks

## 2019-07-15 NOTE — Telephone Encounter (Signed)
Ok. I told her to call the number on her insurance card. Hopefully that will help her.

## 2019-07-15 NOTE — Telephone Encounter (Signed)
[name] has dementia, not sure what she is talking about, her last visit with me was in February 2020, and the last time we talked on the phone she was asking about her back pain. Not quite sure what she is asking or if she is addressing it to the correct person. Thanks

## 2019-07-15 NOTE — Telephone Encounter (Signed)
Dr. Delice Lesch,  Do you know what this pt is speaking of. She mentioned referrals. Pt states that it was time to renew her ins. I instructed her to call the number on her insurance card for help. Is there something you need me to make her aware of?

## 2019-07-15 NOTE — Telephone Encounter (Signed)
Patient is returning your call.    Thanks

## 2019-08-19 ENCOUNTER — Other Ambulatory Visit: Payer: Self-pay | Admitting: Pharmacist

## 2019-08-19 ENCOUNTER — Ambulatory Visit: Payer: Self-pay | Admitting: Pharmacist

## 2019-08-19 NOTE — Patient Outreach (Signed)
[name]) Care Management  08/19/2019  [name] 09-16-1956 [id number]   Patient was called to follow up on pill packs. HIPAA identifiers were obtained.  [name] also to verify the patient's prescriptions since she is getting them in pill packs.  Patient said she is not having any issues with her medications and is able to take them using her pill packs. [facility] said the patient's next delivery is scheduled for 09/01/2019.  Plan: Follow up in 3 months. Consider closing case.  [name], PharmD, [facility] Clinical Pharmacist [phone]

## 2019-08-26 ENCOUNTER — Other Ambulatory Visit: Payer: Self-pay

## 2019-08-28 ENCOUNTER — Ambulatory Visit: Payer: Medicare HMO | Admitting: Endocrinology

## 2019-09-03 ENCOUNTER — Other Ambulatory Visit: Payer: Self-pay

## 2019-09-03 NOTE — Patient Outreach (Signed)
[facility]) Care Management Chronic Special Needs Program  09/03/2019  Name: [name] DOB: 1955-11-19  MRN: [id number]  [name] is enrolled in a chronic special needs plan for Heart Failure. Chronic Care Management Coordinator telephoned client to complete health risk assessment and to develop individualized care plan.  Introduced the chronic care management program, importance of client participation, and taking their care plan to all provider appointments and inpatient facilities.  Subjective:Telephone call to client.  HIPAA verified. Client states she has seen her primary MD, cardiologist and Dr. Loanne Drilling within the past 3 months. Client reports her son uses her car during the day which makes it difficult at times for her to get to her appointments. RNCM offered to refer client to [facility] social worker for transportation assistance. Client verbally agreed. Client reports she sees her neurologist for memory loss.  Client reports she left a message with her primary MD office last week regarding symptoms but has not heard back. Client reports since Thanksgiving 2020 she has not felt well. She describes symptoms as feeling flushed and falling a lot. Client states she has had severe back pain since Thanksgiving and reports falling x 4 on yesterday. Client denies any dizziness or  injury with the falls. Client reports having a walker but does not use it.  Client states she lives alone and she is concerned that she may fall on the floor and  not be able to get up.  RNCM discussed medical alert system with client. Client states she is interested in receiving additional information regarding an alert system.  RNCM advised client to keep her cellular phone with her in the event of a fall. Advised to call family and/ or 911 if she is unable to get up or sustains injury. Client verbalized understanding. RNCM offered to call clients primary MD to report her symptoms and schedule follow up  appointment. Client verbally agreed.  Client reports she is slow with providing her activities of daily living but states she is independent with her care.  Client reports she is taking her medications as prescribed.  Client reports having heart failure. She states she has follow up appointments with her cardiologist and has defibrillator checks. Client denies any symptoms of shortness of breath or extremity swelling. Client reports she is not checking her weights daily at this time because her scale needs a battery. Client states she is able to get a replacement battery.  Client denies knowledge of a heart failure action plan. RNCM discussed heart failure action plan with client and importance of checking and recording weights daily. Client verbalized understanding. Client reports she takes medication for depression. She states she does not have a counselor or see a psychiatrist.  RNCM discussed and offered [facility] care management social worker follow up for symptoms. Client verbally agreed.  Client reports checking her blood pressure at home.  Client voiced some confusion regarding the dates in which she check her blood pressure last. RNCM discussed hypertension self management with client.  Client reports she is smoking one cigarette per day. She states she has not purchased any packs of cigarettes since June 2020. She states one of her family members gives her 1 cigarette a day and that is what she smokes.  RNCM discussed smoking cessation with client. Client agreeable to receiving education material regarding smoking cessation.   RNCM contacted client primary MD office and spoke with [name] in triage.  Reported clients voiced symptoms of feeling flushed, back pain and  falls along with PHQ2 and PHQ9 score.  [name] states she will give this information clients primary MD and will call client to schedule virtual visit.      Goals Addressed            This Visit's Progress   .  Acknowledge receipt of  Programme researcher, broadcasting/film/video      . Client understands the importance of follow-up with providers by attending scheduled visits      . Client will report improved coping within 3 months       RNCM referred client to [facility] care management social worker to address anxiety/ depression symptoms RNCM reported assessment PHQ2 / [place] 9 results to clients primary MD office on 09/03/19 RNCM discussed importance of client taking her prescribed antidepressant as directed by her doctor.     . Client will report no fall or injuries within the next 3 months       RNCM notified primary MD on 09/02/10 of clients recent falls.       . Client will verbalize knowledge of self management of Hypertension as evidences by BP reading of 140/90 or less; or as defined by provider       Assessed hypertension self management skills. Confirmed client has home blood pressure monitor. Discussed blood pressure targets. Mailed EMMI education article on hypertension to client    . Client/Caregiver will verbalize understanding of instructions related to self-care and safety       RNCM mailed safety brochure to client    . Maintain timely refills of Heart Failure medication as prescribed within the year       . Obtain annual  Lipid Profile, LDL-C      . Visit Primary Care Provider or Cardiologist at least 2 times per year         ASSESSMENT:  Client not weighing daily due to non functioning scale. Client verbalized lack of understanding regarding heart failure action plan.   Plan:  Send successful outreach letter with a copy of their individualized care plan, Send individual care plan to provider and Send educational material on hypertension and memory loss.  Send client Advanced Care plan packet and fall safety brochure.  Chronic care management coordination will outreach in:  3 Months  Will refer client to:  Social Work for transportation assistance, anxiety/depression symptom follow up, Advance directive assistance, and  Medical Alert information   [name] RN,BSN,CCM [place] Management [phone]

## 2019-09-04 ENCOUNTER — Encounter (HOSPITAL_COMMUNITY): Payer: Self-pay

## 2019-09-04 ENCOUNTER — Emergency Department (HOSPITAL_COMMUNITY): Payer: HMO

## 2019-09-04 ENCOUNTER — Other Ambulatory Visit: Payer: Self-pay

## 2019-09-04 ENCOUNTER — Observation Stay (HOSPITAL_COMMUNITY)
Admission: EM | Admit: 2019-09-04 | Discharge: 2019-09-05 | Disposition: A | Discharge status: Home / Self Care | Payer: HMO | Attending: Internal Medicine | Admitting: Internal Medicine

## 2019-09-04 DIAGNOSIS — I2510 Atherosclerotic heart disease of native coronary artery without angina pectoris: Secondary | ICD-10-CM | POA: Insufficient documentation

## 2019-09-04 DIAGNOSIS — Z7982 Long term (current) use of aspirin: Secondary | ICD-10-CM | POA: Diagnosis not present

## 2019-09-04 DIAGNOSIS — R55 Syncope and collapse: Secondary | ICD-10-CM

## 2019-09-04 DIAGNOSIS — I951 Orthostatic hypotension: Secondary | ICD-10-CM | POA: Diagnosis not present

## 2019-09-04 DIAGNOSIS — Z79899 Other long term (current) drug therapy: Secondary | ICD-10-CM | POA: Insufficient documentation

## 2019-09-04 DIAGNOSIS — F32A Depression, unspecified: Secondary | ICD-10-CM

## 2019-09-04 DIAGNOSIS — I110 Hypertensive heart disease with heart failure: Secondary | ICD-10-CM | POA: Insufficient documentation

## 2019-09-04 DIAGNOSIS — E876 Hypokalemia: Secondary | ICD-10-CM | POA: Diagnosis not present

## 2019-09-04 DIAGNOSIS — F419 Anxiety disorder, unspecified: Secondary | ICD-10-CM | POA: Diagnosis not present

## 2019-09-04 DIAGNOSIS — Z20828 Contact with and (suspected) exposure to other viral communicable diseases: Secondary | ICD-10-CM | POA: Insufficient documentation

## 2019-09-04 DIAGNOSIS — Z952 Presence of prosthetic heart valve: Secondary | ICD-10-CM

## 2019-09-04 DIAGNOSIS — E210 Primary hyperparathyroidism: Secondary | ICD-10-CM | POA: Diagnosis present

## 2019-09-04 DIAGNOSIS — W1839XA Other fall on same level, initial encounter: Secondary | ICD-10-CM | POA: Diagnosis not present

## 2019-09-04 DIAGNOSIS — F17210 Nicotine dependence, cigarettes, uncomplicated: Secondary | ICD-10-CM | POA: Diagnosis not present

## 2019-09-04 DIAGNOSIS — E785 Hyperlipidemia, unspecified: Secondary | ICD-10-CM | POA: Diagnosis not present

## 2019-09-04 DIAGNOSIS — R748 Abnormal levels of other serum enzymes: Secondary | ICD-10-CM

## 2019-09-04 DIAGNOSIS — F329 Major depressive disorder, single episode, unspecified: Secondary | ICD-10-CM | POA: Insufficient documentation

## 2019-09-04 DIAGNOSIS — I350 Nonrheumatic aortic (valve) stenosis: Secondary | ICD-10-CM | POA: Diagnosis not present

## 2019-09-04 DIAGNOSIS — Z953 Presence of xenogenic heart valve: Secondary | ICD-10-CM | POA: Diagnosis not present

## 2019-09-04 DIAGNOSIS — D696 Thrombocytopenia, unspecified: Secondary | ICD-10-CM | POA: Diagnosis not present

## 2019-09-04 DIAGNOSIS — N179 Acute kidney failure, unspecified: Secondary | ICD-10-CM

## 2019-09-04 DIAGNOSIS — I10 Essential (primary) hypertension: Secondary | ICD-10-CM | POA: Diagnosis present

## 2019-09-04 DIAGNOSIS — Z95810 Presence of automatic (implantable) cardiac defibrillator: Secondary | ICD-10-CM | POA: Diagnosis not present

## 2019-09-04 DIAGNOSIS — Z9181 History of falling: Secondary | ICD-10-CM | POA: Diagnosis not present

## 2019-09-04 DIAGNOSIS — M6282 Rhabdomyolysis: Secondary | ICD-10-CM | POA: Insufficient documentation

## 2019-09-04 DIAGNOSIS — I5032 Chronic diastolic (congestive) heart failure: Secondary | ICD-10-CM | POA: Diagnosis present

## 2019-09-04 DIAGNOSIS — I5022 Chronic systolic (congestive) heart failure: Secondary | ICD-10-CM | POA: Diagnosis not present

## 2019-09-04 DIAGNOSIS — F0390 Unspecified dementia without behavioral disturbance: Secondary | ICD-10-CM | POA: Diagnosis not present

## 2019-09-04 DIAGNOSIS — Z03818 Encounter for observation for suspected exposure to other biological agents ruled out: Secondary | ICD-10-CM | POA: Diagnosis not present

## 2019-09-04 LAB — CBC WITH DIFFERENTIAL/PLATELET
Abs Immature Granulocytes: 0.03 10*3/uL (ref 0.00–0.07)
Basophils Absolute: 0 10*3/uL (ref 0.0–0.1)
Basophils Relative: 0 %
Eosinophils Absolute: 0 10*3/uL (ref 0.0–0.5)
Eosinophils Relative: 0 %
HCT: 45.5 % (ref 36.0–46.0)
Hemoglobin: 14.8 g/dL (ref 12.0–15.0)
Immature Granulocytes: 1 %
Lymphocytes Relative: 23 %
Lymphs Abs: 1.3 10*3/uL (ref 0.7–4.0)
MCH: 29.2 pg (ref 26.0–34.0)
MCHC: 32.5 g/dL (ref 30.0–36.0)
MCV: 89.7 fL (ref 80.0–100.0)
Monocytes Absolute: 0.5 10*3/uL (ref 0.1–1.0)
Monocytes Relative: 10 %
Neutro Abs: 3.7 10*3/uL (ref 1.7–7.7)
Neutrophils Relative %: 66 %
Platelets: 106 10*3/uL — ABNORMAL LOW (ref 150–400)
RBC: 5.07 MIL/uL (ref 3.87–5.11)
RDW: 14.4 % (ref 11.5–15.5)
WBC: 5.5 10*3/uL (ref 4.0–10.5)
nRBC: 0 % (ref 0.0–0.2)

## 2019-09-04 LAB — MAGNESIUM: Magnesium: 1.8 mg/dL (ref 1.7–2.4)

## 2019-09-04 LAB — CK: Total CK: 2373 U/L — ABNORMAL HIGH (ref 38–234)

## 2019-09-04 LAB — BASIC METABOLIC PANEL
Anion gap: 12 (ref 5–15)
BUN: 20 mg/dL (ref 8–23)
CO2: 23 mmol/L (ref 22–32)
Calcium: 10.3 mg/dL (ref 8.9–10.3)
Chloride: 106 mmol/L (ref 98–111)
Creatinine, Ser: 1.35 mg/dL — ABNORMAL HIGH (ref 0.44–1.00)
GFR calc Af Amer: 48 mL/min — ABNORMAL LOW (ref >60)
GFR calc non Af Amer: 41 mL/min — ABNORMAL LOW (ref >60)
Glucose, Bld: 118 mg/dL — ABNORMAL HIGH (ref 70–99)
Potassium: 3.3 mmol/L — ABNORMAL LOW (ref 3.5–5.1)
Sodium: 141 mmol/L (ref 135–145)

## 2019-09-04 LAB — TROPONIN I (HIGH SENSITIVITY)
Troponin I (High Sensitivity): 52 ng/L — ABNORMAL HIGH (ref <18)
Troponin I (High Sensitivity): 61 ng/L — ABNORMAL HIGH (ref <18)

## 2019-09-04 MED ORDER — POTASSIUM CHLORIDE 20 MEQ/15ML (10%) PO SOLN
40.0000 meq | Freq: Once | ORAL | Status: AC
  Administered 2019-09-05: 40 meq via ORAL
  Filled 2019-09-04: qty 30

## 2019-09-04 MED ORDER — ACETAMINOPHEN 325 MG PO TABS: 650.0000 mg | ORAL_TABLET | Freq: Four times a day (QID) | ORAL | Status: DC | PRN

## 2019-09-04 MED ORDER — ACETAMINOPHEN 650 MG RE SUPP: 650.0000 mg | Freq: Four times a day (QID) | RECTAL | Status: DC | PRN

## 2019-09-04 MED ORDER — CARBIDOPA-LEVODOPA 25-100 MG PO TABS: 1.0000 | ORAL_TABLET | Freq: Two times a day (BID) | ORAL | Status: DC

## 2019-09-04 MED ORDER — SODIUM CHLORIDE 0.9% FLUSH
3.0000 mL | Freq: Two times a day (BID) | INTRAVENOUS | Status: DC
  Administered 2019-09-05: 3 mL via INTRAVENOUS

## 2019-09-04 MED ORDER — SODIUM CHLORIDE 0.9 % IV BOLUS
1000.0000 mL | Freq: Once | INTRAVENOUS | Status: AC
  Administered 2019-09-04: 1000 mL via INTRAVENOUS

## 2019-09-04 MED ORDER — ASPIRIN EC 81 MG PO TBEC
81.0000 mg | DELAYED_RELEASE_TABLET | Freq: Every day | ORAL | Status: DC
  Administered 2019-09-05: 81 mg via ORAL
  Filled 2019-09-04: qty 1

## 2019-09-04 MED ORDER — SERTRALINE HCL 100 MG PO TABS
100.0000 mg | ORAL_TABLET | Freq: Every day | ORAL | Status: DC
  Administered 2019-09-05: 100 mg via ORAL
  Filled 2019-09-04: qty 1

## 2019-09-04 MED ORDER — DONEPEZIL HCL 10 MG PO TABS
10.0000 mg | ORAL_TABLET | Freq: Every day | ORAL | Status: DC
  Administered 2019-09-05: 10 mg via ORAL
  Filled 2019-09-04: qty 1

## 2019-09-04 MED ORDER — POTASSIUM CHLORIDE IN NACL 20-0.9 MEQ/L-% IV SOLN
INTRAVENOUS | Status: DC
  Administered 2019-09-05: 02:00:00 via INTRAVENOUS
  Filled 2019-09-04: qty 1000

## 2019-09-04 NOTE — ED Triage Notes (Signed)
Pt arrived by [facility] from home. Family reports syncope episode. PT was ambulatory upon ems arrival. PT denies hitting head. PT stated she remembers the fall. Pt is alert and oriented at this time. PT initial BP with EMS was 70/55. PT denies any other complaints at this time. Pt has had 100NS  20 L AC

## 2019-09-04 NOTE — H&P (Addendum)
History and Physical    [name] QBH:[id number] DOB: 1956/07/29 DOA: 09/04/2019  PCP: Lujean Amel, MD  Patient coming from: Home  I have personally briefly reviewed patient's old medical records in [place]  Chief Complaint: Near syncopal episodes  HPI: [name] is a 63 y.o. female with medical history significant for aortic stenosis s/p bioprosthetic AVR, NICM s/p AICD, chronic systolic CHF (EF 09-73% by TTE 07/11/2017), mild CAD, hypertension, hyperlipidemia, primary hyperparathyroidism, depression, and mild dementia who presents to the ED for evaluation of near syncopal episodes with a fall today.  History is supplemented by EDP, chart review, and son by phone.  Patient has been having several near syncopal episodes over the past week.  Today while she was walking around the house she became lightheaded and fell to the ground.  She denied losing consciousness but says she was unable to get up because of dizziness.  She is on the ground for approximately 6 hours until her son visited her after work and found her lying there.  She denied any significant injury.  She denies any chest pain, palpitations, dyspnea, cough, nausea, vomiting, abdominal pain, or dysuria.  She does report some semisoft stool but no watery diarrhea.  ED Course:  Initial vitals showed BP 112/75, pulse 93, RR 18, temp 97.6 Fahrenheit, SPO2 96% on room air.  Orthostatic vital signs were positive.  BUN 20, creatinine 1.35,Labs are notable for potassium 3.3, sodium 141, bicarb 23, serum glucose 118, CKD 2373, WBC 5.5, hemoglobin 14.8, platelets 106,000, high-sensitivity troponin I 52.  Urinalysis is ordered and pending.  Portable chest x-ray shows left-sided AICD in place with prior sternotomy and AVR changes.  No focal consolidation, edema, or effusion are noted.  Patient was given 1 L normal saline and the hospitalist service was consulted to admit for further evaluation management.  Review of  Systems: All systems reviewed and are negative except as documented in history of present illness above.   Past Medical History:  Diagnosis Date  . Abnormal liver function tests   . AICD (automatic cardioverter/defibrillator) present    [employer]  . Anxiety   . Aortic stenosis   . Benign neoplasm of colon 04/19/2001   Hyperplastic  . Bicuspid aortic valve   . Cardiomyopathy secondary   . CHF (congestive heart failure) ([place])   . Coronary artery disease   . Depression   . Headache   . Heart murmur   . HTN (hypertension)   . Hypercalcemia   . Hyperlipidemia   . Memory changes 06/2018   in the last year /per the brother  . S/P ICD (internal cardiac defibrillator) procedure 2018    Past Surgical History:  Procedure Laterality Date  . AORTIC VALVE REPLACEMENT N/A 11/16/2016   Procedure: AORTIC VALVE REPLACEMENT (AVR);  Surgeon: Gaye Pollack, MD;  Location: [facility];  Service: Open Heart Surgery;  Laterality: N/A;  31mm Perimount Magna Ease Pericardial Bioprosthesis  . BREAST EXCISIONAL BIOPSY Left 1984   benign  . CARDIAC CATHETERIZATION N/A 07/05/2016   Procedure: Right/Left Heart Cath and Coronary Angiography;  Surgeon: Burnell Blanks, MD;  Location: [facility];  Service: Cardiovascular;  Laterality: N/A;  . CHOLECYSTECTOMY    . implantation of a Guidant biventricular ICD    . laparoscopic cholecystectomy with intraoperative cholangiogram  2003  . TEE WITHOUT CARDIOVERSION N/A 11/16/2016   Procedure: TRANSESOPHAGEAL ECHOCARDIOGRAM (TEE);  Surgeon: Gaye Pollack, MD;  Location: [facility];  Service: Open  Heart Surgery;  Laterality: N/A;    Social History:  reports that she has been smoking cigarettes. She has a 10.00 pack-year smoking history. She has never used smokeless tobacco. She reports current alcohol use. She reports that she does not use drugs.  Allergies  Allergen Reactions  . Trospium Chloride Er Other (See Comments)    Constipation      Family History  Problem Relation Age of Onset  . Pancreatic cancer Mother   . Colon cancer Maternal Aunt   . Bone cancer Brother   . Heart attack Brother   . Hypercalcemia Neg Hx      Prior to Admission medications   Medication Sig Start Date End Date Taking? Authorizing Provider  acetaminophen (TYLENOL) 325 MG tablet Take 325 mg by mouth every 6 (six) hours as needed for mild pain.   Yes [provider]  aspirin EC 81 MG tablet Take 1 tablet (81 mg total) daily by mouth. Patient taking differently: Take 81 mg by mouth daily as needed for moderate pain.  08/10/17  Yes Burnell Blanks, MD  carbidopa-levodopa (SINEMET IR) 25-100 MG tablet Take 1 tablet twice a day at breakfast and dinner 02/28/19  Yes Cameron Sprang, MD  carvedilol (COREG) 12.5 MG tablet Take 1 tablet by mouth 2 (two) times a day. With food 12/16/18  Yes [provider]  donepezil (ARICEPT) 10 MG tablet Take 1/2 tablet daily for 2 weeks, then increase to 1 tablet daily Patient taking differently: Take 10 mg by mouth daily.  02/28/19  Yes Cameron Sprang, MD  furosemide (LASIX) 20 MG tablet Take 1 tablet by mouth daily. 01/08/19  Yes [provider]  Multiple Vitamin (MULTIVITAMIN WITH MINERALS) TABS tablet Take 1 tablet by mouth at bedtime.   Yes [provider]  olmesartan (BENICAR) 40 MG tablet Take 0.5 tablets (20 mg total) by mouth daily. 08/28/17  Yes Burnell Blanks, MD  Polyethyl Glycol-Propyl Glycol (SYSTANE OP) Place 1-2 drops into both eyes 3 (three) times daily as needed (for dry eyes.).   Yes [provider]  polyethylene glycol (MIRALAX / GLYCOLAX) packet Take 17 g by mouth daily. 04/05/18  Yes Domenic Moras, PA-C  rosuvastatin (CRESTOR) 10 MG tablet Take 1 tablet (10 mg total) by mouth daily. 05/01/11  Yes Evans Lance, MD  sertraline (ZOLOFT) 100 MG tablet Take 1 tablet (100 mg total) by mouth daily. 02/28/19  Yes Cameron Sprang, MD    Physical Exam:  Vitals:   09/04/19 2115 09/04/19 2207 09/04/19 2225 09/04/19 2300  BP: 120/85 (!) 141/89 (!) 124/91 136/83  Pulse: 81 84 87 85  Resp: 19 16 12 15   Temp:      TempSrc:      SpO2: 100% 97% 94% 99%   Constitutional: Elderly woman resting supine in bed, NAD, calm, comfortable Eyes: PERRL, lids and conjunctivae normal ENMT: Mucous membranes are moist. Posterior pharynx clear of any exudate or lesions. Neck: normal, supple, no masses. Respiratory: clear to auscultation bilaterally, no wheezing, no crackles. Normal respiratory effort. No accessory muscle use.  Cardiovascular: Regular rate and rhythm, systolic murmur present. No extremity edema.  ICD in place left chest wall. Abdomen: no tenderness, no masses palpated. No hepatosplenomegaly. Bowel sounds positive.  Musculoskeletal: no clubbing / cyanosis. No joint deformity upper and lower extremities. Good ROM, no contractures. Normal muscle tone.  Skin: no rashes, lesions, ulcers. No induration Neurologic: CN 2-12 grossly intact. Sensation intact, moving extremities slow however strength 5/5  in all 4.  Psychiatric: Alert and oriented to self, place, and year.  Appears to have some difficulty with memory.  Affect is flat.    Labs on Admission: I have personally reviewed following labs and imaging studies  CBC: Recent Labs  Lab 09/04/19 2034  WBC 5.5  NEUTROABS 3.7  HGB 14.8  HCT 45.5  MCV 89.7  PLT 578*   Basic Metabolic Panel: Recent Labs  Lab 09/04/19 2034  NA 141  K 3.3*  CL 106  CO2 23  GLUCOSE 118*  BUN 20  CREATININE 1.35*  CALCIUM 10.3   GFR: CrCl cannot be calculated (Unknown ideal weight.). Liver Function Tests: No results for input(s): AST, ALT, ALKPHOS, BILITOT, PROT, ALBUMIN in the last 168 hours. No results for input(s): LIPASE, AMYLASE in the last 168 hours. No results for input(s): AMMONIA in the last 168 hours. Coagulation Profile: No results for input(s): INR, PROTIME in the last 168 hours. Cardiac  Enzymes: Recent Labs  Lab 09/04/19 2034  CKTOTAL 2,373*   BNP (last 3 results) No results for input(s): PROBNP in the last 8760 hours. HbA1C: No results for input(s): HGBA1C in the last 72 hours. CBG: No results for input(s): GLUCAP in the last 168 hours. Lipid Profile: No results for input(s): CHOL, HDL, LDLCALC, TRIG, CHOLHDL, LDLDIRECT in the last 72 hours. Thyroid Function Tests: No results for input(s): TSH, T4TOTAL, FREET4, T3FREE, THYROIDAB in the last 72 hours. Anemia Panel: No results for input(s): VITAMINB12, FOLATE, FERRITIN, TIBC, IRON, RETICCTPCT in the last 72 hours. Urine analysis:    Component Value Date/Time   COLORURINE AMBER (A) 04/05/2018 1718   APPEARANCEUR CLEAR 04/05/2018 1718   LABSPEC 1.030 04/05/2018 1718   PHURINE 5.0 04/05/2018 1718   GLUCOSEU NEGATIVE 04/05/2018 1718   HGBUR NEGATIVE 04/05/2018 1718   BILIRUBINUR NEGATIVE 04/05/2018 1718   KETONESUR NEGATIVE 04/05/2018 1718   PROTEINUR 100 (A) 04/05/2018 1718   NITRITE POSITIVE (A) 04/05/2018 1718   LEUKOCYTESUR SMALL (A) 04/05/2018 1718    Radiological Exams on Admission: DG Chest 1 View  Result Date: 09/04/2019 CLINICAL DATA:  Syncope EXAM: CHEST  1 VIEW COMPARISON:  None. FINDINGS: The heart size and mediastinal contours are within normal limits. Both lungs are clear. Unchanged position of left chest wall AICD. Remote median sternotomy and aortic valve replacement. The visualized skeletal structures are unremarkable. IMPRESSION: No active cardiopulmonary disease. Electronically Signed   By: Ulyses Jarred M.D.   On: 09/04/2019 20:18    EKG: Independently reviewed.  V paced rhythm.  Assessment/Plan Principal Problem:   Near syncope Active Problems:   Hyperlipidemia   Essential hypertension   Automatic implantable cardioverter-defibrillator in situ   Chronic systolic heart failure (HCC)   Hyperparathyroidism, primary ([place])   S/P AVR (aortic valve replacement)   AKI (acute kidney  injury) ([place])   Hypokalemia   Depression   Dementia ([place])  [name] is a 63 y.o. female with medical history significant for aortic stenosis s/p bioprosthetic AVR, NICM s/p AICD, chronic systolic CHF (EF 46-96% by TTE 07/11/2017), mild CAD, hypertension, hyperlipidemia, primary hyperparathyroidism, depression, and mild dementia who is admitted for evaluation of near syncopal episodes.  Near syncopal episodes with orthostatic hypotension: Positive orthostatic vital signs in the ED suggests orthostatic hypotension is likely etiology.  She does have a history of aortic stenosis status post AVR with last echocardiogram in 2018.  EKG shows V paced rhythm.  High-sensitivity troponin I is mildly increased to 52, repeat is pending. -Monitor on  telemetry -Continue gentle IV fluid hydration overnight -Update echocardiogram -Hold home Lasix, Coreg, olmesartan, and Sinemet -PT/OT eval  Acute kidney injury: Suspect prerenal from dehydration/hypotension in addition to Lasix and olmesartan use. -Continue IV fluid resuscitation overnight as above -Holding Lasix and olmesartan -Urinalysis pending  Rhabdomyolysis: CK mildly elevated, likely secondary to prolonged time down.  Continue IV fluid hydration as above.  Aortic stenosis s/p bioprosthetic aortic valve replacement: Last evaluated by echocardiogram in 2018.  Will update echocardiogram as above.  Continue aspirin 81 mg daily.  Chronic systolic CHF: With improved EF to 55-60% by echocardiogram 07/11/2017 after aortic valve replacement in February 2018. -Holding Lasix, Coreg, and olmesartan as above in setting of orthostatic hypotension -Monitor strict I/O's and daily weights  Hypokalemia: K 3.3 on admission.  Replete orally and add K to IV fluids.  Check magnesium and replete as needed.  Thrombocytopenia: Without obvious bleeding or indication for transfusion.  Continue to monitor while on aspirin.  Mild CAD: Chronic and stable.   Mildly increased high-sensitivity troponin, likely demand ischemia.  EKG without acute changes. -Continue aspirin 81 mg daily  Hypertension: Holding home Lasix, olmesartan, Coreg as above.  Hyperlipidemia: Holding home rosuvastatin with elevated CK.  Primary hyperparathyroidism: Chronic and stable, follows with endocrinology as an outpatient.  Depression: Continue home sertraline.  Mild dementia:  Follows with neurology as an outpatient.  Continue home donepezil, hold Sinemet for now in setting of orthostatic hypotension.  DVT prophylaxis: SCDs Code Status: Full code per discussion with son Family Communication: Discussed with son [name] by phone [phone] Disposition Plan: Pending clinical progress Consults called: None Admission status: Observation   Zada Finders MD [facility]  If 7PM-7AM, please contact night-coverage [email or web address]  09/04/2019, 11:10 PM

## 2019-09-04 NOTE — ED Notes (Signed)
While talking to pt She stated she recently moved to an apartment and lives alone and has  fell several times and has trouble getting up. Pt stated family comes to cheek on her everyday.

## 2019-09-04 NOTE — ED Notes (Signed)
Patient became unsteady on her feet while doing orthostatic BP had to return to bed prior to BP being taken

## 2019-09-04 NOTE — ED Notes (Signed)
Patient became unsteady on her feet and had to lay back down upon standing

## 2019-09-04 NOTE — ED Provider Notes (Signed)
[facility] DEPT Provider Note   CSN: [id number] Arrival date & time: 09/04/19  6010     History Chief Complaint  Patient presents with  . Loss of Consciousness  . Hypotension    [name] is a 63 y.o. female history of aortic stenosis status post valve replacement, congestive heart failure, hypertension, AICD, not on anticoagulation, presenting to the ED with hypotension and near syncope.  Patient reports that she was walking around her house today and became suddenly lightheaded.  She describes some tunnel vision.  She says she fell to the ground.  She says she was unable to get up because she felt dizzy.  She says she spent approximately 6 hours on the ground until her son came after work and found her lying there.  She says she has had about 3 similar episodes in the past week.  She describes eating very little at home. She lives by herself.  She typically cooks for herself and eats grapes and oatmeal, but says "do not feel like I am eating enough".  She does states she sips water all day long.  She denies dysuria, fevers, chills, cough, congestion.  Patient was brought in by EMS.  Reportedly had a blood pressure of 70/55 per EMS.  They gave her 100 cc of normal saline in route.  Otherwise she has no acute complaints.  She does not feel lightheaded at this time.  HPI     Past Medical History:  Diagnosis Date  . Abnormal liver function tests   . AICD (automatic cardioverter/defibrillator) present    [employer]  . Anxiety   . Aortic stenosis   . Benign neoplasm of colon 04/19/2001   Hyperplastic  . Bicuspid aortic valve   . Cardiomyopathy secondary   . CHF (congestive heart failure) ([place])   . Coronary artery disease   . Depression   . Headache   . Heart murmur   . HTN (hypertension)   . Hypercalcemia   . Hyperlipidemia   . Memory changes 06/2018   in the last year /per the brother  . S/P ICD (internal cardiac  defibrillator) procedure 2018    Patient Active Problem List   Diagnosis Date Noted  . Near syncope 09/04/2019  . AKI (acute kidney injury) ([place]) 09/04/2019  . Hypokalemia 09/04/2019  . Depression 09/04/2019  . Dementia ([place]) 09/04/2019  . Thrombocytopenia ([place]) 09/04/2019  . Low back pain 06/25/2019  . S/P AVR (aortic valve replacement) 12/12/2016  . Aortic stenosis 11/16/2016  . Severe aortic stenosis   . Polycythemia 06/06/2016  . Menopausal state 02/16/2016  . Hyperparathyroidism, primary ([place]) 02/16/2016  . Chronic systolic heart failure ([place]) 05/07/2014  . Hx of colonic polyps 06/17/2013  . Ventricular tachycardia ([place]) 02/21/2011  . Automatic implantable cardioverter-defibrillator in situ 06/02/2009  . Hyperlipidemia 05/29/2009  . SMOKER 05/29/2009  . Essential hypertension 05/29/2009  . Aortic valve disorder 05/29/2009  . CARDIOMYOPATHY, SECONDARY 05/29/2009  . CHF 05/29/2009  . BICUSPID AORTIC VALVE 05/29/2009  . LIVER FUNCTION TESTS, ABNORMAL, HX OF 05/29/2009  . CHOLECYSTITIS, HX OF 05/29/2009    Past Surgical History:  Procedure Laterality Date  . AORTIC VALVE REPLACEMENT N/A 11/16/2016   Procedure: AORTIC VALVE REPLACEMENT (AVR);  Surgeon: Gaye Pollack, MD;  Location: [facility];  Service: Open Heart Surgery;  Laterality: N/A;  39mm Perimount Magna Ease Pericardial Bioprosthesis  . BREAST EXCISIONAL BIOPSY Left 1984   benign  . CARDIAC CATHETERIZATION N/A 07/05/2016   Procedure:  Right/Left Heart Cath and Coronary Angiography;  Surgeon: Burnell Blanks, MD;  Location: [facility];  Service: Cardiovascular;  Laterality: N/A;  . CHOLECYSTECTOMY    . implantation of a Guidant biventricular ICD    . laparoscopic cholecystectomy with intraoperative cholangiogram  2003  . TEE WITHOUT CARDIOVERSION N/A 11/16/2016   Procedure: TRANSESOPHAGEAL ECHOCARDIOGRAM (TEE);  Surgeon: Gaye Pollack, MD;  Location: [facility];  Service: Open Heart Surgery;  Laterality: N/A;       OB History   No obstetric history on file.     Family History  Problem Relation Age of Onset  . Pancreatic cancer Mother   . Colon cancer Maternal Aunt   . Bone cancer Brother   . Heart attack Brother   . Hypercalcemia Neg Hx     Social History   Tobacco Use  . Smoking status: Current Every Day Smoker    Packs/day: 0.25    Years: 40.00    Pack years: 10.00    Types: Cigarettes  . Smokeless tobacco: Never Used  . Tobacco comment: client reports she smokes 1 cigarette per day  Substance Use Topics  . Alcohol use: Yes    Alcohol/week: 0.0 standard drinks    Comment: occasional  . Drug use: No    Home Medications Prior to Admission medications   Medication Sig Start Date End Date Taking? Authorizing Provider  acetaminophen (TYLENOL) 325 MG tablet Take 325 mg by mouth every 6 (six) hours as needed for mild pain.   Yes [provider]  aspirin EC 81 MG tablet Take 1 tablet (81 mg total) daily by mouth. Patient taking differently: Take 81 mg by mouth daily as needed for moderate pain.  08/10/17  Yes Burnell Blanks, MD  carbidopa-levodopa (SINEMET IR) 25-100 MG tablet Take 1 tablet twice a day at breakfast and dinner 02/28/19  Yes Cameron Sprang, MD  carvedilol (COREG) 12.5 MG tablet Take 1 tablet by mouth 2 (two) times a day. With food 12/16/18  Yes [provider]  donepezil (ARICEPT) 10 MG tablet Take 1/2 tablet daily for 2 weeks, then increase to 1 tablet daily Patient taking differently: Take 10 mg by mouth daily.  02/28/19  Yes Cameron Sprang, MD  furosemide (LASIX) 20 MG tablet Take 1 tablet by mouth daily. 01/08/19  Yes [provider]  Multiple Vitamin (MULTIVITAMIN WITH MINERALS) TABS tablet Take 1 tablet by mouth at bedtime.   Yes [provider]  olmesartan (BENICAR) 40 MG tablet Take 0.5 tablets (20 mg total) by mouth daily. 08/28/17  Yes Burnell Blanks, MD  Polyethyl Glycol-Propyl Glycol (SYSTANE OP) Place 1-2  drops into both eyes 3 (three) times daily as needed (for dry eyes.).   Yes [provider]  polyethylene glycol (MIRALAX / GLYCOLAX) packet Take 17 g by mouth daily. 04/05/18  Yes Domenic Moras, PA-C  rosuvastatin (CRESTOR) 10 MG tablet Take 1 tablet (10 mg total) by mouth daily. 05/01/11  Yes Evans Lance, MD  sertraline (ZOLOFT) 100 MG tablet Take 1 tablet (100 mg total) by mouth daily. 02/28/19  Yes Cameron Sprang, MD    Allergies    Trospium chloride er  Review of Systems   Review of Systems  Constitutional: Positive for fatigue. Negative for chills and fever.  Respiratory: Negative for cough and shortness of breath.   Cardiovascular: Negative for chest pain and palpitations.  Gastrointestinal: Negative for nausea and vomiting.  Genitourinary: Negative for difficulty urinating and dysuria.  Skin: Negative for color change and rash.  Neurological: Positive for syncope and light-headedness.  All other systems reviewed and are negative.   Physical Exam Updated Vital Signs BP (!) 159/96   Pulse 66   Temp 98.1 F (36.7 C)   Resp 16   Ht 5\' 4"  (1.626 m)   Wt 56.1 kg   SpO2 100%   BMI 21.23 kg/m   Physical Exam Vitals and nursing note reviewed.  Constitutional:      General: She is not in acute distress.    Appearance: She is well-developed.  HENT:     Head: Normocephalic and atraumatic.  Eyes:     Conjunctiva/sclera: Conjunctivae normal.  Cardiovascular:     Rate and Rhythm: Normal rate and regular rhythm.     Heart sounds: Murmur present.  Pulmonary:     Effort: Pulmonary effort is normal. No respiratory distress.  Abdominal:     Palpations: Abdomen is soft.     Tenderness: There is no abdominal tenderness.  Musculoskeletal:     Cervical back: Neck supple.  Skin:    General: Skin is warm and dry.  Neurological:     General: No focal deficit present.     Mental Status: She is alert and oriented to person, place, and time.  Psychiatric:        Mood and  Affect: Mood normal.        Behavior: Behavior normal.     ED Results / Procedures / Treatments   Labs (all labs ordered are listed, but only abnormal results are displayed) Labs Reviewed  BASIC METABOLIC PANEL - Abnormal; Notable for the following components:      Result Value   Potassium 3.3 (*)    Glucose, Bld 118 (*)    Creatinine, Ser 1.35 (*)    GFR calc non Af Amer 41 (*)    GFR calc Af Amer 48 (*)    All other components within normal limits  CBC WITH DIFFERENTIAL/PLATELET - Abnormal; Notable for the following components:   Platelets 106 (*)    All other components within normal limits  URINALYSIS, ROUTINE W REFLEX MICROSCOPIC - Abnormal; Notable for the following components:   APPearance HAZY (*)    Ketones, ur 5 (*)    Protein, ur 100 (*)    Leukocytes,Ua TRACE (*)    Bacteria, UA RARE (*)    All other components within normal limits  CK - Abnormal; Notable for the following components:   Total CK 2,373 (*)    All other components within normal limits  CBC - Abnormal; Notable for the following components:   Platelets 139 (*)    All other components within normal limits  BASIC METABOLIC PANEL - Abnormal; Notable for the following components:   Potassium 5.2 (*)    Glucose, Bld 134 (*)    Creatinine, Ser 1.03 (*)    GFR calc non Af Amer 57 (*)    All other components within normal limits  CK - Abnormal; Notable for the following components:   Total CK 3,227 (*)    All other components within normal limits  BASIC METABOLIC PANEL - Abnormal; Notable for the following components:   Glucose, Bld 108 (*)    GFR calc non Af Amer 59 (*)    All other components within normal limits  TROPONIN I (HIGH SENSITIVITY) - Abnormal; Notable for the following components:   Troponin I (High Sensitivity) 52 (*)    All other components within normal  limits  TROPONIN I (HIGH SENSITIVITY) - Abnormal; Notable for the following components:   Troponin I (High Sensitivity) 61 (*)     All other components within normal limits  SARS CORONAVIRUS 2 (TAT 6-24 HRS)  MAGNESIUM  MAGNESIUM  GLUCOSE, CAPILLARY  MAGNESIUM  HIV ANTIBODY (ROUTINE TESTING W REFLEX)    EKG None  Radiology DG Chest 1 View  Result Date: 09/04/2019 CLINICAL DATA:  Syncope EXAM: CHEST  1 VIEW COMPARISON:  None. FINDINGS: The heart size and mediastinal contours are within normal limits. Both lungs are clear. Unchanged position of left chest wall AICD. Remote median sternotomy and aortic valve replacement. The visualized skeletal structures are unremarkable. IMPRESSION: No active cardiopulmonary disease. Electronically Signed   By: Ulyses Jarred M.D.   On: 09/04/2019 20:18    Procedures Procedures (including critical care time)  Medications Ordered in ED Medications  sodium chloride flush (NS) 0.9 % injection 3 mL (3 mLs Intravenous Not Given 09/05/19 1019)  acetaminophen (TYLENOL) tablet 650 mg (has no administration in time range)    Or  acetaminophen (TYLENOL) suppository 650 mg (has no administration in time range)  aspirin EC tablet 81 mg (81 mg Oral Given 09/05/19 1018)  sertraline (ZOLOFT) tablet 100 mg (100 mg Oral Given 09/05/19 1018)  donepezil (ARICEPT) tablet 10 mg (10 mg Oral Given 09/05/19 1018)  pneumococcal 23 valent vaccine (PNEUMOVAX-23) injection 0.5 mL (has no administration in time range)  0.9 %  sodium chloride infusion ( Intravenous New Bag/Given 09/05/19 0739)  sodium chloride 0.9 % bolus 1,000 mL (0 mLs Intravenous Stopped 09/04/19 2156)  potassium chloride 20 MEQ/15ML (10%) solution 40 mEq (40 mEq Oral Given 09/05/19 0221)    ED Course  I have reviewed the triage vital signs and the nursing notes.  Pertinent labs & imaging results that were available during my care of the patient were reviewed by me and considered in my medical decision making (see chart for details).  63 year old female with a history of aortic stenosis presenting to the emergency department with  multiple near syncopal episodes while at her house this week.  Most recent episode was today she has been approximately 6 hours on the floor was unable to get up.  She describes lightheadedness as a prodrome.  Appears the symptoms began while she was up and moving about, not related to rising from the chair.  However she also appears dehydrated on exam and reports very poor PO intake (food and fluids).  She has positive orthostatics based on her nursing protocol here-the patient became extremely lightheaded when attempted to stand her up from the bed.  She also has an elevated CK level and a bump in her creatinine.  She been multiple hours on the floor today.  Unclear if there may be a component of aortic stenosis related to this.  Not sure if she was exerting herself during the symptoms or syncopal episodes.  She may need another echocardiogram.  Given her cardiac history, her age, her recurrent episodes, and the fact that she lives alone, I believe she should be admitted to the hospital.  This note was dictated using dragon dictation software.  Please be aware that there may be minor translation errors as a result of this oral dictation   Clinical Course as of Sep 04 1137  Thu Sep 04, 2019  2021 Nurse reports patient became extremely lightheaded and dizzy upon standing up for orthstatc vitals, and had to be laid back down.  I suspect this counts  as a positive finding.  Will give IVF   [MT]  2150 CK Total(!): 2,373 [MT]    Clinical Course User Index [MT] Wyvonnia Dusky, MD   MDM Rules/Calculators/A&P     CHA2DS2/VAS Stroke Risk Points      N/A >= 2 Points: High Risk  1 - 1.99 Points: Medium Risk  0 Points: Low Risk    A final score could not be computed because of missing components.: Last  Change: N/A     This score determines the patient's risk of having a stroke if the  patient has atrial fibrillation.      This score is not applicable to this patient. Components are not    calculated.                  Final Clinical Impression(s) / ED Diagnoses Final diagnoses:  Near syncope  Elevated CK  Aortic valve stenosis, etiology of cardiac valve disease unspecified    Rx / DC Orders ED Discharge Orders    None       Wyvonnia Dusky, MD 09/05/19 1139

## 2019-09-05 ENCOUNTER — Other Ambulatory Visit: Payer: Self-pay

## 2019-09-05 ENCOUNTER — Observation Stay (HOSPITAL_BASED_OUTPATIENT_CLINIC_OR_DEPARTMENT_OTHER): Payer: HMO

## 2019-09-05 ENCOUNTER — Encounter (HOSPITAL_COMMUNITY): Payer: Self-pay | Admitting: Internal Medicine

## 2019-09-05 DIAGNOSIS — I361 Nonrheumatic tricuspid (valve) insufficiency: Secondary | ICD-10-CM

## 2019-09-05 DIAGNOSIS — I951 Orthostatic hypotension: Secondary | ICD-10-CM | POA: Diagnosis not present

## 2019-09-05 DIAGNOSIS — N179 Acute kidney failure, unspecified: Secondary | ICD-10-CM | POA: Diagnosis not present

## 2019-09-05 DIAGNOSIS — R55 Syncope and collapse: Secondary | ICD-10-CM | POA: Diagnosis not present

## 2019-09-05 LAB — URINALYSIS, ROUTINE W REFLEX MICROSCOPIC
Bilirubin Urine: NEGATIVE
Glucose, UA: NEGATIVE mg/dL
Hgb urine dipstick: NEGATIVE
Ketones, ur: 5 mg/dL — AB
Nitrite: NEGATIVE
Protein, ur: 100 mg/dL — AB
Specific Gravity, Urine: 1.015 (ref 1.005–1.030)
pH: 6 (ref 5.0–8.0)

## 2019-09-05 LAB — BASIC METABOLIC PANEL
Anion gap: 10 (ref 5–15)
Anion gap: 9 (ref 5–15)
BUN: 17 mg/dL (ref 8–23)
BUN: 16 mg/dL (ref 8–23)
CO2: 22 mmol/L (ref 22–32)
CO2: 24 mmol/L (ref 22–32)
Calcium: 9.6 mg/dL (ref 8.9–10.3)
Calcium: 9.7 mg/dL (ref 8.9–10.3)
Chloride: 110 mmol/L (ref 98–111)
Chloride: 110 mmol/L (ref 98–111)
Creatinine, Ser: 1.03 mg/dL — ABNORMAL HIGH (ref 0.44–1.00)
Creatinine, Ser: 1 mg/dL (ref 0.44–1.00)
GFR calc Af Amer: >60 mL/min (ref >60)
GFR calc Af Amer: >60 mL/min (ref >60)
GFR calc non Af Amer: 57 mL/min — ABNORMAL LOW (ref >60)
GFR calc non Af Amer: 59 mL/min — ABNORMAL LOW (ref >60)
Glucose, Bld: 134 mg/dL — ABNORMAL HIGH (ref 70–99)
Glucose, Bld: 108 mg/dL — ABNORMAL HIGH (ref 70–99)
Potassium: 5.2 mmol/L — ABNORMAL HIGH (ref 3.5–5.1)
Potassium: 3.6 mmol/L (ref 3.5–5.1)
Sodium: 142 mmol/L (ref 135–145)
Sodium: 143 mmol/L (ref 135–145)

## 2019-09-05 LAB — HIV ANTIBODY (ROUTINE TESTING W REFLEX): HIV Screen 4th Generation wRfx: NONREACTIVE

## 2019-09-05 LAB — CBC
HCT: 44.1 % (ref 36.0–46.0)
Hemoglobin: 13.7 g/dL (ref 12.0–15.0)
MCH: 28.8 pg (ref 26.0–34.0)
MCHC: 31.1 g/dL (ref 30.0–36.0)
MCV: 92.6 fL (ref 80.0–100.0)
Platelets: 139 10*3/uL — ABNORMAL LOW (ref 150–400)
RBC: 4.76 MIL/uL (ref 3.87–5.11)
RDW: 15.1 % (ref 11.5–15.5)
WBC: 5.5 10*3/uL (ref 4.0–10.5)
nRBC: 0 % (ref 0.0–0.2)

## 2019-09-05 LAB — CK
Total CK: 3227 U/L — ABNORMAL HIGH (ref 38–234)
Total CK: 3429 U/L — ABNORMAL HIGH (ref 38–234)

## 2019-09-05 LAB — MAGNESIUM
Magnesium: 1.8 mg/dL (ref 1.7–2.4)
Magnesium: 1.8 mg/dL (ref 1.7–2.4)

## 2019-09-05 LAB — GLUCOSE, CAPILLARY: Glucose-Capillary: 80 mg/dL (ref 70–99)

## 2019-09-05 LAB — ECHOCARDIOGRAM COMPLETE
Height: 64 in
Weight: 1978.85 oz

## 2019-09-05 LAB — SARS CORONAVIRUS 2 (TAT 6-24 HRS): SARS Coronavirus 2: NEGATIVE

## 2019-09-05 MED ORDER — PNEUMOCOCCAL VAC POLYVALENT 25 MCG/0.5ML IJ INJ: 0.5000 mL | INJECTION | INTRAMUSCULAR | Status: DC

## 2019-09-05 MED ORDER — FUROSEMIDE 20 MG PO TABS
20.0000 mg | ORAL_TABLET | Freq: Every day | ORAL | Status: DC
  Administered 2019-09-05: 20 mg via ORAL
  Filled 2019-09-05: qty 1

## 2019-09-05 MED ORDER — SODIUM CHLORIDE 0.9 % IV SOLN
INTRAVENOUS | Status: AC
  Administered 2019-09-05: 08:00:00 via INTRAVENOUS

## 2019-09-05 MED ORDER — PNEUMOCOCCAL VAC POLYVALENT 25 MCG/0.5ML IJ INJ: 0.5000 mL | INJECTION | INTRAMUSCULAR | 0 refills | Status: AC

## 2019-09-05 MED ORDER — ROSUVASTATIN CALCIUM 10 MG PO TABS
10.0000 mg | ORAL_TABLET | Freq: Every day | ORAL | Status: DC
  Administered 2019-09-05: 10 mg via ORAL
  Filled 2019-09-05: qty 1

## 2019-09-05 MED ORDER — CARVEDILOL 12.5 MG PO TABS
12.5000 mg | ORAL_TABLET | Freq: Two times a day (BID) | ORAL | Status: DC
  Administered 2019-09-05: 12.5 mg via ORAL
  Filled 2019-09-05: qty 1

## 2019-09-05 MED ORDER — IRBESARTAN 150 MG PO TABS
150.0000 mg | ORAL_TABLET | Freq: Every day | ORAL | Status: DC
  Administered 2019-09-05: 150 mg via ORAL
  Filled 2019-09-05: qty 1

## 2019-09-05 NOTE — Discharge Summary (Signed)
Physician Discharge Summary  [name] IZT:[id number] DOB: 12-25-1955 DOA: 09/04/2019  PCP: Lujean Amel, MD  Admit date: 09/04/2019 Discharge date: 09/05/2019   Code Status: Full Code  Admitted From: Home Discharged to: [facility] HH[facility]/OT-rep Tommi Rumps able to accept-start of care early next week-patient agreed  Equipment/Devices: Tub/shower seat and rolling walker with 5 inch wheels recommended.  Patient reported she had this at home Discharge Condition: Stable  Recommendations for Outpatient Follow-up   1. Follow up with PCP in 1 week 2. Patient continues to have orthostatic hypotension consider med rec and possibly discontinuing donepezil 3. Consider outpatient follow-up with cardiology regarding global right ventricle moderately reduced systolic function.  Was not worked up while inpatient  Hospital Summary  This is a 63 year old female with past medical history of AF status post bioprosthetic AVR, and ICM, status post AICD, chronic systolic heart failure EF 55 to 60% in 2018, CAD, hypertension, hyperlipidemia, hyperparathyroidism, depression, dementia on donepezil who presented to the ED for evaluation of near syncopal episode and fall.  ED Course:  Initial vitals showed BP 112/75, pulse 93, RR 18, temp 97.6 Fahrenheit, SPO2 96% on room air.  Orthostatic vital signs were positive. BUN 20, creatinine 1.35,Labs are notable for potassium 3.3, sodium 141, bicarb 23, serum glucose 118, CKD 2373, WBC 5.5, hemoglobin 14.8, platelets 106,000, high-sensitivity troponin I 52->62.  CK elevated Urinalysis negative Portable chest x-ray shows left-sided AICD in place with prior sternotomy and AVR changes.  No focal consolidation, edema, or effusion are noted. Patient was given 1 L normal saline and the hospitalist service was consulted to admit for further evaluation management.   Patient's home BP meds were held on admission, she received NS and KCl infusion overnight and  subsequently had elevated potassium at 5.2.  Labs were repeated and showed K3.6 and resolved AKI.  Echo performed on 12/11 which showed EF 55 to 60%, normal LV systolic function, grade 1 diastolic dysfunction and global right ventricular moderately reduced systolic function similar to baseline in 2018.  Patient had resolved orthostatic hypotension with IV fluids.  She was discharged in stable condition on her home meds with recommendation to follow-up with PCP for possible medication rec.  A & P   Principal Problem:   Near syncope Active Problems:   Hyperlipidemia   Essential hypertension   Automatic implantable cardioverter-defibrillator in situ   Chronic systolic heart failure (HCC)   Hyperparathyroidism, primary (HCC)   S/P AVR (aortic valve replacement)   AKI (acute kidney injury) ([place])   Hypokalemia   Depression   Dementia (HCC)   Thrombocytopenia (HCC)    Near syncopal episodes secondary to orthostatic hypotension: Positive orthostatic vital signs in the ED which resolved with IV fluids. echo repeated this admission was similar to baseline.  EKG shows V paced rhythm.  High-sensitivity troponin I is mildly increased to 52->61, likely demand ischemia.  No overnight events on telemetry.  Asymptomatic from cardiac standpoint.  Evaluated by PT who recommended home health PT/OT which is to be set up for next week. -Home meds restarted at discharge -Follow-up with PCP and consider med rec  Acute kidney injury: Suspect prerenal from dehydration/hypotension in addition to Lasix and olmesartan use.  Resolved with holding home meds on admission and IV fluids. -Continue home meds at discharge with PCP follow-up and labs  Rhabdomyolysis: CK mildly elevated, likely secondary to prolonged time down.  Continue IV fluid hydration as above.  Aortic stenosis s/p bioprosthetic aortic valve replacement: Echo updated as above -  Continue aspirin 81 mg daily.  Chronic systolic CHF: With  improved EF to 55-60% by echocardiogram 07/11/2017 after aortic valve replacement in February 2018. -Start home meds at discharge  Hypokalemia: Resolved with KCl and IV fluids  Chronic thrombocytopenia: Without obvious bleeding or indication for transfusion.  Continue to monitor outpatient while on aspirin.  Mild CAD: Chronic and stable.  Mildly increased high-sensitivity troponin, likely demand ischemia.  EKG without acute changes. -Continue aspirin 81 mg daily  Hypertension: Continue home Lasix, olmesartan, Coreg as above.  Hyperlipidemia: Continue home rosuvastatin at discharge  Primary hyperparathyroidism: Chronic and stable, follows with endocrinology as an outpatient.  Depression: Continue home sertraline.  Mild dementia:  Follows with neurology as an outpatient.   -Continue home meds at discharge and consider medication reconciliation if patient continues to have symptoms    Consultants  . None  Procedures  . Echo  Antibiotics  None   Subjective  Patient seen and examined at bedside no acute distress and resting comfortably.  No events overnight.  Tolerating diet. In good spirits and anticipating discharge.   Denies any chest pain, shortness of breath, fever, nausea, vomiting, urinary or bowel complaints. Otherwise ROS negative   Objective   Discharge Exam: Vitals:   09/05/19 1642 09/05/19 1644  BP: (!) 190/113 (!) 177/117  Pulse: 66 71  Resp:    Temp:    SpO2:     Vitals:   09/05/19 1344 09/05/19 1639 09/05/19 1642 09/05/19 1644  BP: (!) 163/112 (!) 188/111 (!) 190/113 (!) 177/117  Pulse: 68 63 66 71  Resp: 14     Temp: 98.4 F (36.9 C)     TempSrc: Oral     SpO2: 100%     Weight:      Height:        Physical Exam Vitals and nursing note reviewed.  Constitutional:      Appearance: Normal appearance.  HENT:     Head: Normocephalic and atraumatic.     Nose: Nose normal.     Mouth/Throat:     Mouth: Mucous membranes are  moist.  Eyes:     Extraocular Movements: Extraocular movements intact.  Cardiovascular:     Rate and Rhythm: Normal rate and regular rhythm.     Heart sounds: Murmur present.  Pulmonary:     Effort: Pulmonary effort is normal.     Breath sounds: Normal breath sounds.  Abdominal:     General: Abdomen is flat.     Palpations: Abdomen is soft.  Musculoskeletal:        General: No swelling. Normal range of motion.     Cervical back: Normal range of motion. No rigidity.  Neurological:     General: No focal deficit present.     Mental Status: She is alert. Mental status is at baseline.  Psychiatric:        Mood and Affect: Mood normal.        Behavior: Behavior normal.       The results of significant diagnostics from this hospitalization (including imaging, microbiology, ancillary and laboratory) are listed below for reference.     Microbiology: Recent Results (from the past 240 hour(s))  SARS CORONAVIRUS 2 (TAT 6-24 HRS) Nasopharyngeal Nasopharyngeal Swab     Status: None   Collection Time: 09/04/19 10:02 PM   Specimen: Nasopharyngeal Swab  Result Value Ref Range Status   SARS Coronavirus 2 NEGATIVE NEGATIVE Final    Comment: (NOTE) SARS-CoV-2 target nucleic acids are NOT DETECTED. The  SARS-CoV-2 RNA is generally detectable in upper and lower respiratory specimens during the acute phase of infection. Negative results do not preclude SARS-CoV-2 infection, do not rule out co-infections with other pathogens, and should not be used as the sole basis for treatment or other patient management decisions. Negative results must be combined with clinical observations, patient history, and epidemiological information. The expected result is Negative. Fact Sheet for Patients: SugarRoll.be Fact Sheet for Healthcare Providers: https://www.woods-mathews.com/ This test is not yet approved or cleared by the [place] FDA and  has been  authorized for detection and/or diagnosis of SARS-CoV-2 by FDA under an Emergency Use Authorization (EUA). This EUA will remain  in effect (meaning this test can be used) for the duration of the COVID-19 declaration under Section 56 4(b)(1) of the Act, 21 U.S.C. section 360bbb-3(b)(1), unless the authorization is terminated or revoked sooner. Performed at [facility], [place] [address] [place], [place] [address]      Labs: BNP (last 3 results) No results for input(s): BNP in the last 8760 hours. Basic Metabolic Panel: Recent Labs  Lab 09/04/19 2034 09/04/19 2338 09/05/19 0534 09/05/19 0751  NA 141  --  142 143  K 3.3*  --  5.2* 3.6  CL 106  --  110 110  CO2 23  --  22 24  GLUCOSE 118*  --  134* 108*  BUN 20  --  17 16  CREATININE 1.35*  --  1.03* 1.00  CALCIUM 10.3  --  9.6 9.7  MG  --  1.8 1.8 1.8   Liver Function Tests: No results for input(s): AST, ALT, ALKPHOS, BILITOT, PROT, ALBUMIN in the last 168 hours. No results for input(s): LIPASE, AMYLASE in the last 168 hours. No results for input(s): AMMONIA in the last 168 hours. CBC: Recent Labs  Lab 09/04/19 2034 09/05/19 0534  WBC 5.5 5.5  NEUTROABS 3.7  --   HGB 14.8 13.7  HCT 45.5 44.1  MCV 89.7 92.6  PLT 106* 139*   Cardiac Enzymes: Recent Labs  Lab 09/04/19 2034 09/05/19 0534  CKTOTAL 2,373* 3,227*   BNP: Invalid input(s): POCBNP CBG: Recent Labs  Lab 09/05/19 0404  GLUCAP 80   D-Dimer No results for input(s): DDIMER in the last 72 hours. Hgb A1c No results for input(s): HGBA1C in the last 72 hours. Lipid Profile No results for input(s): CHOL, HDL, LDLCALC, TRIG, CHOLHDL, LDLDIRECT in the last 72 hours. Thyroid function studies No results for input(s): TSH, T4TOTAL, T3FREE, THYROIDAB in the last 72 hours.  Invalid input(s): FREET3 Anemia work up No results for input(s): VITAMINB12, FOLATE, FERRITIN, TIBC, IRON, RETICCTPCT in the last 72 hours. Urinalysis    Component Value  Date/Time   COLORURINE YELLOW 09/04/2019 1957   APPEARANCEUR HAZY (A) 09/04/2019 1957   LABSPEC 1.015 09/04/2019 1957   PHURINE 6.0 09/04/2019 [place] NEGATIVE 09/04/2019 [place] NEGATIVE 09/04/2019 [place] NEGATIVE 09/04/2019 1957   KETONESUR 5 (A) 09/04/2019 1957   PROTEINUR 100 (A) 09/04/2019 1957   NITRITE NEGATIVE 09/04/2019 1957   LEUKOCYTESUR TRACE (A) 09/04/2019 1957   Sepsis Labs Invalid input(s): PROCALCITONIN,  WBC,  LACTICIDVEN Microbiology Recent Results (from the past 240 hour(s))  SARS CORONAVIRUS 2 (TAT 6-24 HRS) Nasopharyngeal Nasopharyngeal Swab     Status: None   Collection Time: 09/04/19 10:02 PM   Specimen: Nasopharyngeal Swab  Result Value Ref Range Status   SARS Coronavirus 2 NEGATIVE NEGATIVE Final    Comment: (  NOTE) SARS-CoV-2 target nucleic acids are NOT DETECTED. The SARS-CoV-2 RNA is generally detectable in upper and lower respiratory specimens during the acute phase of infection. Negative results do not preclude SARS-CoV-2 infection, do not rule out co-infections with other pathogens, and should not be used as the sole basis for treatment or other patient management decisions. Negative results must be combined with clinical observations, patient history, and epidemiological information. The expected result is Negative. Fact Sheet for Patients: SugarRoll.be Fact Sheet for Healthcare Providers: https://www.woods-mathews.com/ This test is not yet approved or cleared by the [place] FDA and  has been authorized for detection and/or diagnosis of SARS-CoV-2 by FDA under an Emergency Use Authorization (EUA). This EUA will remain  in effect (meaning this test can be used) for the duration of the COVID-19 declaration under Section 56 4(b)(1) of the Act, 21 U.S.C. section 360bbb-3(b)(1), unless the authorization is terminated or revoked sooner. Performed at [facility], [place] [address], [place], [place] [address]     Discharge Instructions     Discharge Instructions    Diet - low sodium heart healthy   Complete by: As directed    Discharge instructions   Complete by: As directed    You were seen and examined in the hospital for near loss of consciousness and cared for by a hospitalist.   Upon Discharge:  -Continue taking her medications as prescribed -Follow-up with your primary care physician within 1 week Bring all home medications to your appointment to review Request that your primary physician go over all hospital tests and procedures/radiological results at the follow up.   Please get all hospital records sent to your physician by signing a hospital release before you go home.     Read the complete instructions along with all the possible side effects for all the medicines you take and that have been prescribed to you. Take any new medicines after you have completely understood and accept all the possible adverse reactions/side effects.   If you have any questions about your discharge medications or the care you received while you were in the hospital, you can call the unit and asked to speak with the hospitalist on call. Once you are discharged, your primary care physician will handle any further medical issues. Please note that NO REFILLS for any discharge medications will be authorized, as it is imperative that you return to your primary care physician (or establish a relationship with a primary care physician if you do not have one) for your aftercare needs so that they can reassess your need for medications and monitor your lab values.   Do not drive, operate heavy machinery, perform activities at heights, swimming or participation in water activities or provide baby sitting services if your were admitted for loss of consciousness/seizures or if you are on sedating medications including, but not limited to benzodiazepines, sleep medications, narcotic  pain medications, etc., until you have been cleared to do so by a medical doctor.   Do not take more than prescribed medications.   Wear a seat belt while driving.  If you have smoked or chewed Tobacco in the last 2 years please stop smoking; also stop any regular Alcohol and/or any Recreational drug use including marijuana.  If you experience worsening of your admission symptoms or develop shortness of breath, chest pain, suicidal or homicidal thoughts or experience a life threatening emergency, you must seek medical attention immediately by calling 911 or calling your PCP immediately.   Increase  activity slowly   Complete by: As directed      Allergies as of 09/05/2019      Reactions   Trospium Chloride Er Other (See Comments)   Constipation       Medication List    TAKE these medications   acetaminophen 325 MG tablet Commonly known as: TYLENOL Take 325 mg by mouth every 6 (six) hours as needed for mild pain.   aspirin EC 81 MG tablet Take 1 tablet (81 mg total) daily by mouth. What changed:   when to take this  reasons to take this   carbidopa-levodopa 25-100 MG tablet Commonly known as: SINEMET IR Take 1 tablet twice a day at breakfast and dinner   carvedilol 12.5 MG tablet Commonly known as: COREG Take 1 tablet by mouth 2 (two) times a day. With food   donepezil 10 MG tablet Commonly known as: ARICEPT Take 1/2 tablet daily for 2 weeks, then increase to 1 tablet daily What changed:   how much to take  how to take this  when to take this  additional instructions   furosemide 20 MG tablet Commonly known as: LASIX Take 1 tablet by mouth daily.   multivitamin with minerals Tabs tablet Take 1 tablet by mouth at bedtime.   olmesartan 40 MG tablet Commonly known as: BENICAR Take 0.5 tablets (20 mg total) by mouth daily.   pneumococcal 23 valent vaccine 25 MCG/0.5ML injection Commonly known as: PNEUMOVAX-23 Inject 0.5 mLs into the muscle now for 1  dose.   polyethylene glycol 17 g packet Commonly known as: MIRALAX / GLYCOLAX Take 17 g by mouth daily.   rosuvastatin 10 MG tablet Commonly known as: CRESTOR Take 1 tablet (10 mg total) by mouth daily.   sertraline 100 MG tablet Commonly known as: ZOLOFT Take 1 tablet (100 mg total) by mouth daily.   SYSTANE OP Place 1-2 drops into both eyes 3 (three) times daily as needed (for dry eyes.).      Follow-up Information    [facility] Follow up.   Specialty: [place] Why: [facility] physical therapy/occupational therapy- start of care early next week. Contact information: [place] [address] [phone]          Allergies  Allergen Reactions  . Trospium Chloride Er Other (See Comments)    Constipation     Time coordinating discharge: Over 30 minutes   SIGNED:   Harold Hedge, D.O. [facility] Pager: [phone]  09/05/2019, 4:54 PM

## 2019-09-05 NOTE — TOC Transition Note (Signed)
Transition of Care [facility]) - CM/SW Discharge Note   Patient Details  Name: [name] MRN: [id number] Date of Birth: 1956/09/15  Transition of Care [facility]) CM/SW Contact:  Dessa Phi, RN Phone Number: 09/05/2019, 3:06 PM   Clinical Narrative:   [facility] HH[facility]/OT-rep Tommi Rumps able to accept-start of care early next week-patient agreed. No further CM needs.    Final next level of care: [place] Barriers to Discharge: No Barriers Identified   Patient Goals and CMS Choice        Discharge Placement                       Discharge Plan and Services   Discharge Planning Services: CM Consult                      HH Arranged: PT, OT HH Agency: [facility] Date [place]: 09/05/19 Time [place]: [id number] Representative spoke with at [place]: [place] Determinants of Health ([place]) Interventions     Readmission Risk Interventions No flowsheet data found.

## 2019-09-05 NOTE — Evaluation (Addendum)
Occupational Therapy Evaluation Patient Details Name: [name] MRN: [id number] DOB: 06-03-1956 Today's Date: 09/05/2019    History of Present Illness 63 y.o. female with medical history significant for aortic stenosis s/p bioprosthetic AVR, NICM s/p AICD, chronic systolic CHF (EF 46-65% by TTE 07/11/2017), mild CAD, hypertension, hyperlipidemia, primary hyperparathyroidism, depression, and mild dementia who presents to the ED for evaluation of near syncopal episodes with a fall; found to have orthostatic hypotension   Clinical Impression   Pt with decline in function and safety with ADLs and ADL mobility with impaired strength, balance, endurance with hx of mild dementia. Upon arrival, leaning over in recliner to R side pressing bed controls stating that she was trying to recline the chair. OT assisted pt with how to recline chair and educated her on the bed controls. PTA, pt lived at home alone in an apartment and has family that check on her daily. Pt was independent with ADLs/selfcare and did not use an AD for mobility. Pt currently requires min A with LB ADLs, min guard assist with toileting and grooming standing at sink; pt min A with mobility using RW. Pt denied dizziness during mobility. Pt would benefit from acute OT services to address impairments to maximize level of function and safety    Follow Up Recommendations  Home health OT    Equipment Recommendations  Tub/shower seat    Recommendations for Other Services       Precautions / Restrictions Precautions Precautions: Fall Precaution Comments: incontinent      Mobility Bed Mobility Overal bed mobility: Needs Assistance Bed Mobility: Supine to Sit     Supine to sit: Supervision     General bed mobility comments: pt in recliner upon arrival  Transfers Overall transfer level: Needs assistance Equipment used: Rolling walker (2 wheeled) Transfers: Sit to/from Stand Sit to Stand: Min assist         General  transfer comment: verbal cues for hand placement, assist to rise and steady    Balance Overall balance assessment: History of Falls;Needs assistance Sitting-balance support: Feet unsupported Sitting balance-Leahy Scale: Good     Standing balance support: Single extremity supported;Bilateral upper extremity supported;During functional activity Standing balance-Leahy Scale: Poor                             ADL either performed or assessed with clinical judgement   ADL Overall ADL's : Needs assistance/impaired Eating/Feeding: Independent;Sitting   Grooming: Wash/dry hands;Wash/dry face;Min guard;Standing   Upper Body Bathing: Set up;Sitting   Lower Body Bathing: Minimal assistance;Sit to/from stand   Upper Body Dressing : Set up;Sitting   Lower Body Dressing: Minimal assistance;Sit to/from stand   Toilet Transfer: Minimal assistance;Ambulation;RW;Cueing for safety;Cueing for sequencing;Regular Toilet;Grab bars   Toileting- Clothing Manipulation and Hygiene: Min guard;Sit to/from stand       Functional mobility during ADLs: Minimal assistance;Rolling walker;Cueing for safety;Cueing for sequencing       Vision Baseline Vision/History: Wears glasses Wears Glasses: Reading only Patient Visual Report: No change from baseline       Perception     Praxis      Pertinent Vitals/Pain Pain Assessment: No/denies pain     Hand Dominance Right   Extremity/Trunk Assessment Upper Extremity Assessment Upper Extremity Assessment: Generalized weakness   Lower Extremity Assessment Lower Extremity Assessment: Defer to PT evaluation   Cervical / Trunk Assessment Cervical / Trunk Assessment: Normal   Communication Communication Communication: No difficulties  Cognition Arousal/Alertness: Awake/alert Behavior During Therapy: Flat affect Overall Cognitive Status: No family/caregiver present to determine baseline cognitive functioning                                  General Comments: pt leaned over R side of recliner pressing bed controls attemting to recline chair upon OT arrival   General Comments       Exercises     Shoulder Instructions      Home Living Family/patient expects to be discharged to:: Private residence Living Arrangements: Alone Available Help at Discharge: Family;Available PRN/intermittently Type of Home: Apartment Home Access: Stairs to enter Entrance Stairs-Number of Steps: 17 Entrance Stairs-Rails: Right Home Layout: One level     Bathroom Shower/Tub: Tub/shower unit         Home Equipment: None          Prior Functioning/Environment Level of Independence: Independent                 OT Problem List: Decreased strength;Impaired balance (sitting and/or standing);Decreased cognition;Decreased activity tolerance;Decreased safety awareness;Decreased knowledge of use of DME or AE      OT Treatment/Interventions: Self-care/ADL training;DME and/or AE instruction;Therapeutic activities;Balance training;Patient/family education    OT Goals(Current goals can be found in the care plan section) Acute Rehab OT Goals Patient Stated Goal: go home OT Goal Formulation: With patient Time For Goal Achievement: 09/19/19 Potential to Achieve Goals: Good ADL Goals Pt Will Perform Grooming: with supervision;with set-up;standing Pt Will Perform Lower Body Bathing: with min guard assist;with supervision;with set-up;sit to/from stand Pt Will Perform Lower Body Dressing: with min guard assist;with supervision;with set-up;sit to/from stand Pt Will Transfer to Toilet: with min guard assist;with supervision;ambulating Pt Will Perform Toileting - Clothing Manipulation and hygiene: with supervision;sit to/from stand Pt Will Perform Tub/Shower Transfer: with min guard assist;with supervision;ambulating;rolling walker;shower seat;3 in 1;grab bars  OT Frequency: Min 2X/week   Barriers to D/C:             Co-evaluation              AM-PAC OT "6 Clicks" Daily Activity     Outcome Measure Help from another person eating meals?: None Help from another person taking care of personal grooming?: A Little Help from another person toileting, which includes using toliet, bedpan, or urinal?: A Little Help from another person bathing (including washing, rinsing, drying)?: A Little Help from another person to put on and taking off regular upper body clothing?: None Help from another person to put on and taking off regular lower body clothing?: A Little 6 Click Score: 20   End of Session Equipment Utilized During Treatment: Gait belt;Rolling walker  Activity Tolerance: Patient tolerated treatment well Patient left: in chair;with call bell/phone within reach;with chair alarm set  OT Visit Diagnosis: Unsteadiness on feet (R26.81);Muscle weakness (generalized) (M62.81);History of falling (Z91.81);Other symptoms and signs involving cognitive function                Time: 3291-9166 OT Time Calculation (min): 24 min Charges:  OT General Charges $OT Visit: 1 Visit OT Evaluation $OT Eval Low Complexity: 1 Low OT Treatments $Self Care/Home Management : 8-22 mins    Britt Bottom 09/05/2019, 1:17 PM

## 2019-09-05 NOTE — Plan of Care (Signed)
  Problem: Education: Goal: Knowledge of General Education information will improve Description Including pain rating scale, medication(s)/side effects and non-pharmacologic comfort measures Outcome: Progressing   Problem: Health Behavior/Discharge Planning: Goal: Ability to manage health-related needs will improve Outcome: Progressing   

## 2019-09-05 NOTE — Progress Notes (Signed)
  Echocardiogram 2D Echocardiogram has been performed.  Matilde Bash 09/05/2019, 1:34 PM

## 2019-09-05 NOTE — Patient Outreach (Addendum)
  [facility]) Care Management Chronic Special Needs Program    09/05/2019  Name: [name], DOB: Apr 11, 1956  MRN: [id number]   [name] is enrolled in a chronic special needs plan for Heart Failure. Notification received of patients ED visit on 09/05/19.  Follow up call to clients primary MD office regarding appointment request.  RNCM spoke with Colletta Maryland who states office attempted to reach client on 09/03/19 and 09/04/19 without success.   Colletta Maryland reports office attempted to reach client today and was informed that she was in the hospital.   PLAN:  RNCM will continue to follow for discharge disposition.   [name] RN,BSN,CCM [place] Management [phone]

## 2019-09-05 NOTE — Evaluation (Signed)
Physical Therapy Evaluation Patient Details Name: [name] MRN: [id number] DOB: [birth year] Today's Date: 09/05/2019   History of Present Illness  63 y.o. female with medical history significant for aortic stenosis s/p bioprosthetic AVR, NICM s/p AICD, chronic systolic CHF (EF 81-01% by TTE 07/11/2017), mild CAD, hypertension, hyperlipidemia, primary hyperparathyroidism, depression, and mild dementia who presents to the ED for evaluation of near syncopal episodes with a fall; found to have orthostatic hypotension  Clinical Impression  Pt admitted with above diagnosis.  Pt currently with functional limitations due to the deficits listed below (see PT Problem List). Pt will benefit from skilled PT to increase their independence and safety with mobility to allow discharge to the venue listed below.  Pt assisted to bathroom and then recliner.  Pt declined farther ambulation distance.  Pt agreeable to use RW at home and for HHPT. Pt would benefit from supervision for mobility initially for safety upon d/c.  Pt reported dizziness upon sitting however resolved.  Pt denied dizziness with mobility within room.     Follow Up Recommendations Home health PT;Supervision for mobility/OOB    Equipment Recommendations  Rolling walker with 5" wheels    Recommendations for Other Services       Precautions / Restrictions Precautions Precautions: Fall Precaution Comments: incontinent      Mobility  Bed Mobility Overal bed mobility: Needs Assistance Bed Mobility: Supine to Sit     Supine to sit: Supervision        Transfers Overall transfer level: Needs assistance Equipment used: Rolling walker (2 wheeled) Transfers: Sit to/from Stand Sit to Stand: Min assist         General transfer comment: verbal cues for hand placement, assist to rise and steady  Ambulation/Gait Ambulation/Gait assistance: Min guard Gait Distance (Feet): 20 Feet Assistive device: Rolling walker (2  wheeled) Gait Pattern/deviations: Step-through pattern;Decreased stride length;Trunk flexed     General Gait Details: verbal cues for safe use of RW, RW positioning, pt ambulated to bathroom then around bed to recliner (declined going into hallway); reports fatigue  Stairs            Wheelchair Mobility    Modified Rankin (Stroke Patients Only)       Balance Overall balance assessment: History of Falls(pt reports due to syncope episodes, denies mechanical falls)                                           Pertinent Vitals/Pain Pain Assessment: No/denies pain    Home Living Family/patient expects to be discharged to:: Private residence Living Arrangements: Alone Available Help at Discharge: Family;Available PRN/intermittently(family checks in on pt) Type of Home: Apartment Home Access: Stairs to enter Entrance Stairs-Rails: Right Entrance Stairs-Number of Steps: 17 Home Layout: One level Home Equipment: None      Prior Function Level of Independence: Independent               Hand Dominance        Extremity/Trunk Assessment        Lower Extremity Assessment Lower Extremity Assessment: Generalized weakness       Communication   Communication: No difficulties  Cognition Arousal/Alertness: Awake/alert Behavior During Therapy: Flat affect Overall Cognitive Status: Within Functional Limits for tasks assessed  General Comments      Exercises     Assessment/Plan    PT Assessment Patient needs continued PT services  PT Problem List Decreased strength;Decreased mobility;Decreased activity tolerance;Decreased balance;Decreased knowledge of use of DME       PT Treatment Interventions DME instruction;Therapeutic activities;Gait training;Therapeutic exercise;Stair training;Patient/family education;Functional mobility training;Balance training;Neuromuscular re-education    PT  Goals (Current goals can be found in the Care Plan section)  Acute Rehab PT Goals PT Goal Formulation: With patient Time For Goal Achievement: 09/19/19 Potential to Achieve Goals: Good    Frequency Min 3X/week   Barriers to discharge        Co-evaluation               AM-PAC PT "6 Clicks" Mobility  Outcome Measure Help needed turning from your back to your side while in a flat bed without using bedrails?: A Little Help needed moving from lying on your back to sitting on the side of a flat bed without using bedrails?: A Little Help needed moving to and from a bed to a chair (including a wheelchair)?: A Little Help needed standing up from a chair using your arms ([name].g., wheelchair or bedside chair)?: A Little Help needed to walk in hospital room?: A Little Help needed climbing 3-5 steps with a railing? : A Little 6 Click Score: 18    End of Session Equipment Utilized During Treatment: Gait belt Activity Tolerance: Patient limited by fatigue Patient left: in chair;with chair alarm set;with call bell/phone within reach Nurse Communication: Mobility status PT Visit Diagnosis: Difficulty in walking, not elsewhere classified (R26.2);Unsteadiness on feet (R26.81)    Time: 4193-7902 PT Time Calculation (min) (ACUTE ONLY): 25 min   Charges:   PT Evaluation $PT Eval Low Complexity: 1 Low     Kati PT, DPT Acute Rehabilitation Services Office: [phone]   Renie Stelmach,[name] 09/05/2019, 11:55 AM

## 2019-09-08 DIAGNOSIS — M6282 Rhabdomyolysis: Secondary | ICD-10-CM | POA: Diagnosis not present

## 2019-09-08 DIAGNOSIS — F17210 Nicotine dependence, cigarettes, uncomplicated: Secondary | ICD-10-CM | POA: Diagnosis not present

## 2019-09-08 DIAGNOSIS — Z95810 Presence of automatic (implantable) cardiac defibrillator: Secondary | ICD-10-CM | POA: Diagnosis not present

## 2019-09-08 DIAGNOSIS — I110 Hypertensive heart disease with heart failure: Secondary | ICD-10-CM | POA: Diagnosis not present

## 2019-09-08 DIAGNOSIS — I088 Other rheumatic multiple valve diseases: Secondary | ICD-10-CM | POA: Diagnosis not present

## 2019-09-08 DIAGNOSIS — Z8719 Personal history of other diseases of the digestive system: Secondary | ICD-10-CM | POA: Diagnosis not present

## 2019-09-08 DIAGNOSIS — I255 Ischemic cardiomyopathy: Secondary | ICD-10-CM | POA: Diagnosis not present

## 2019-09-08 DIAGNOSIS — Z86010 Personal history of colonic polyps: Secondary | ICD-10-CM | POA: Diagnosis not present

## 2019-09-08 DIAGNOSIS — I4891 Unspecified atrial fibrillation: Secondary | ICD-10-CM | POA: Diagnosis not present

## 2019-09-08 DIAGNOSIS — Z9181 History of falling: Secondary | ICD-10-CM | POA: Diagnosis not present

## 2019-09-08 DIAGNOSIS — I2510 Atherosclerotic heart disease of native coronary artery without angina pectoris: Secondary | ICD-10-CM | POA: Diagnosis not present

## 2019-09-08 DIAGNOSIS — D751 Secondary polycythemia: Secondary | ICD-10-CM | POA: Diagnosis not present

## 2019-09-08 DIAGNOSIS — I5022 Chronic systolic (congestive) heart failure: Secondary | ICD-10-CM | POA: Diagnosis not present

## 2019-09-08 DIAGNOSIS — I472 Ventricular tachycardia: Secondary | ICD-10-CM | POA: Diagnosis not present

## 2019-09-08 DIAGNOSIS — F0390 Unspecified dementia without behavioral disturbance: Secondary | ICD-10-CM | POA: Diagnosis not present

## 2019-09-08 DIAGNOSIS — Z952 Presence of prosthetic heart valve: Secondary | ICD-10-CM | POA: Diagnosis not present

## 2019-09-08 DIAGNOSIS — M545 Low back pain: Secondary | ICD-10-CM | POA: Diagnosis not present

## 2019-09-08 DIAGNOSIS — I0981 Rheumatic heart failure: Secondary | ICD-10-CM | POA: Diagnosis not present

## 2019-09-08 DIAGNOSIS — D696 Thrombocytopenia, unspecified: Secondary | ICD-10-CM | POA: Diagnosis not present

## 2019-09-08 DIAGNOSIS — F329 Major depressive disorder, single episode, unspecified: Secondary | ICD-10-CM | POA: Diagnosis not present

## 2019-09-08 DIAGNOSIS — E785 Hyperlipidemia, unspecified: Secondary | ICD-10-CM | POA: Diagnosis not present

## 2019-09-08 DIAGNOSIS — Z7982 Long term (current) use of aspirin: Secondary | ICD-10-CM | POA: Diagnosis not present

## 2019-09-08 DIAGNOSIS — E210 Primary hyperparathyroidism: Secondary | ICD-10-CM | POA: Diagnosis not present

## 2019-09-10 ENCOUNTER — Telehealth: Payer: Self-pay | Admitting: *Deleted

## 2019-09-10 ENCOUNTER — Other Ambulatory Visit: Payer: Self-pay

## 2019-09-10 DIAGNOSIS — F411 Generalized anxiety disorder: Secondary | ICD-10-CM | POA: Diagnosis not present

## 2019-09-10 DIAGNOSIS — Z79899 Other long term (current) drug therapy: Secondary | ICD-10-CM | POA: Diagnosis not present

## 2019-09-10 DIAGNOSIS — N393 Stress incontinence (female) (male): Secondary | ICD-10-CM | POA: Diagnosis not present

## 2019-09-10 DIAGNOSIS — G20 Parkinson's disease: Secondary | ICD-10-CM | POA: Diagnosis not present

## 2019-09-10 DIAGNOSIS — E7800 Pure hypercholesterolemia, unspecified: Secondary | ICD-10-CM | POA: Diagnosis not present

## 2019-09-10 DIAGNOSIS — I5022 Chronic systolic (congestive) heart failure: Secondary | ICD-10-CM | POA: Diagnosis not present

## 2019-09-10 NOTE — Patient Outreach (Signed)
  [facility]) Care Management Chronic Special Needs Program    09/10/2019  Name: [name], DOB: 09/24/1956  MRN: [id number]   [name] is enrolled in a chronic special needs plan for Heart Failure.  Telephone call to client for follow up assessment. Unable to reach. HIPAA compliant message left with call back phone number and return call request.  PLAN: RNCM will attempt 2nd telephone outreach within 4 business day.   Tiffany Plowman RN,BSN,CCM [place] Management [phone]

## 2019-09-10 NOTE — Patient Outreach (Signed)
[facility]) Care Management  09/10/2019  [name] 08/21/1956 [id number]   CSW attempted to reach patient but patients brother answered the phone stating that they were getting ready to go to her doctors appointment & requested that CSW call back another day. CSW will try again in 1-2 days.    [name], LCSW [facility]  Clinical Social Worker cell #: [phone]

## 2019-09-10 NOTE — Addendum Note (Signed)
Addended by: [name] E on: 09/10/2019 09:47 AM   Modules accepted: Orders

## 2019-09-10 NOTE — Patient Outreach (Signed)
  [facility]) Care Management Chronic Special Needs Program   09/10/2019  Name: LACREASHA HINDS, DOB: 08-19-1956  MRN: [id number]   RNCM attempted call to client. Unable to reach. HIPAA compliant voice message left with call back phone number and return call request.  RNCM contacted clients primary MD office and confirmed client had post ED follow up visit scheduled with primary MD for today.  RNCM contacted [facility] home health and confirmed client's home health services started on 09/08/19.   RNCM had scales and blood pressure monitor mailed to client.    The client was discussed in today's interdisciplinary care team meeting.  The following issues were discussed:  Client's needs, Changes in health status, Care Plan, Coordination of care, Care transitions and Issues/barriers to care  Participants present:   Thea Silversmith, MSN, RN, CCM   Melissa Sandlin RN,BSN,CCM, CDE  [name] RN, BSN, CCM Kelli Churn, RN, CCM, CDE  Marco Collie, MD Maryella Shivers, MD  [name], RN, BSN, MS, CCM Coralie Carpen, MD [place] [facility] CBCS/CMAA [facility] RN Teresa Pelton RN, BSN    Plan: [facility] will continue to follow for care coordination needs and follow up with patient within 4 business days.    [name] RN,BSN,CCM [place] Management [phone]

## 2019-09-11 ENCOUNTER — Ambulatory Visit: Payer: HMO

## 2019-09-11 DIAGNOSIS — E210 Primary hyperparathyroidism: Secondary | ICD-10-CM | POA: Diagnosis not present

## 2019-09-11 DIAGNOSIS — Z95810 Presence of automatic (implantable) cardiac defibrillator: Secondary | ICD-10-CM | POA: Diagnosis not present

## 2019-09-11 DIAGNOSIS — F329 Major depressive disorder, single episode, unspecified: Secondary | ICD-10-CM | POA: Diagnosis not present

## 2019-09-11 DIAGNOSIS — Z7982 Long term (current) use of aspirin: Secondary | ICD-10-CM | POA: Diagnosis not present

## 2019-09-11 DIAGNOSIS — D696 Thrombocytopenia, unspecified: Secondary | ICD-10-CM | POA: Diagnosis not present

## 2019-09-11 DIAGNOSIS — I0981 Rheumatic heart failure: Secondary | ICD-10-CM | POA: Diagnosis not present

## 2019-09-11 DIAGNOSIS — D751 Secondary polycythemia: Secondary | ICD-10-CM | POA: Diagnosis not present

## 2019-09-11 DIAGNOSIS — M6282 Rhabdomyolysis: Secondary | ICD-10-CM | POA: Diagnosis not present

## 2019-09-11 DIAGNOSIS — I088 Other rheumatic multiple valve diseases: Secondary | ICD-10-CM | POA: Diagnosis not present

## 2019-09-11 DIAGNOSIS — I255 Ischemic cardiomyopathy: Secondary | ICD-10-CM | POA: Diagnosis not present

## 2019-09-11 DIAGNOSIS — Z9181 History of falling: Secondary | ICD-10-CM | POA: Diagnosis not present

## 2019-09-11 DIAGNOSIS — I2510 Atherosclerotic heart disease of native coronary artery without angina pectoris: Secondary | ICD-10-CM | POA: Diagnosis not present

## 2019-09-11 DIAGNOSIS — Z86010 Personal history of colonic polyps: Secondary | ICD-10-CM | POA: Diagnosis not present

## 2019-09-11 DIAGNOSIS — Z952 Presence of prosthetic heart valve: Secondary | ICD-10-CM | POA: Diagnosis not present

## 2019-09-11 DIAGNOSIS — E785 Hyperlipidemia, unspecified: Secondary | ICD-10-CM | POA: Diagnosis not present

## 2019-09-11 DIAGNOSIS — I110 Hypertensive heart disease with heart failure: Secondary | ICD-10-CM | POA: Diagnosis not present

## 2019-09-11 DIAGNOSIS — Z8719 Personal history of other diseases of the digestive system: Secondary | ICD-10-CM | POA: Diagnosis not present

## 2019-09-11 DIAGNOSIS — F17210 Nicotine dependence, cigarettes, uncomplicated: Secondary | ICD-10-CM | POA: Diagnosis not present

## 2019-09-11 DIAGNOSIS — I5022 Chronic systolic (congestive) heart failure: Secondary | ICD-10-CM | POA: Diagnosis not present

## 2019-09-11 DIAGNOSIS — I4891 Unspecified atrial fibrillation: Secondary | ICD-10-CM | POA: Diagnosis not present

## 2019-09-11 DIAGNOSIS — M545 Low back pain: Secondary | ICD-10-CM | POA: Diagnosis not present

## 2019-09-11 DIAGNOSIS — I472 Ventricular tachycardia: Secondary | ICD-10-CM | POA: Diagnosis not present

## 2019-09-11 DIAGNOSIS — F0390 Unspecified dementia without behavioral disturbance: Secondary | ICD-10-CM | POA: Diagnosis not present

## 2019-09-12 ENCOUNTER — Other Ambulatory Visit: Payer: Self-pay

## 2019-09-12 NOTE — Patient Outreach (Signed)
  [facility]) Care Management Chronic Special Needs Program    09/12/2019  Name: [name], DOB: 10-26-1955  MRN: [id number]   [name] is enrolled in a chronic special needs plan for Heart Failure. Telephone call to client for follow up assessment. HIPAA verified by client. Client gave verbal authorization to speak with her brother, Mckensi Redinger and son Cheryle Horsfall regarding her health information. Clients brother and son on call with client.  Brother states client has been falling. He reports client fell today without injury. Client states she has been passing out. Brother  reports client saw her doctor on 09/10/19 and is aware of client falling / passing out. Brother states clients doctor advised her to look into an assisted living facility.  Son states, " I want my mom in the best place possible and I'm concerned about affordability.   RNCM advised client and family to talk with assigned [facility] social worker, [name], BSW regarding assisted living options. RNCM provided family contact phone number for [employer]. Family and client verbalized understanding and agreement.  Brother states client has dementia, heart failure and has ongoing back pain. He reports client's back pain has caused her to be bent over. Brother states client's overall condition is worsening. Brother states client turned on the stove yesterday then walked in the back room.  RNCM offered to contact [name] BSW to inform her of families concern and need to discuss assisted living options. Family and client verbalized agreement. RNCM provided 24 hour heath team advantage nurse advise line. Discussed with client/ family how to contact clients doctor after hours if/ when needed.  RNCM contacted [employer]. Informed her of clients current status and need to discuss assisted living arrangement.  Claiborne Billings states she will attempt to reach client and family today.   PLAN;  RNCM will  follow up with client and/ or family within 1 week.  Individualized care plan updated and sent to patient and primary MD.   [name] RN,BSN,CCM [place] Management [phone]

## 2019-09-12 NOTE — Patient Outreach (Signed)
[facility]) Care Management  09/12/2019  [name] [birth year] [id number]   CSW received referral from [place] for transportation assistance, assistance with Advance directive and discuss medical alert options. CSW called & spoke with patient's son, [name] per [facility]VWAQ'[facility] request regarding possible placement. [name] states that he and his uncle/patient's brother are understanding that she needs more help and that she is not quite agreeable at this time but would likely warm up to the idea. CSW will mail patient/patient's son a list of [address] ALFs, [place] application and brochure for [employer] (Program for [place] for the Elderly) for them to look over. [name] states that they have tried to apply for Medicaid in the past but was told that she is over the income level. CSW encouraged them to look over the list of facilities and the PACE brochure which would be a good backup if patient is adamant about not going to a facility. [name] to review documents & CSW will follow-up in 1 week.    [name], LCSW [facility]  Clinical Social Worker cell #: [phone]

## 2019-09-15 ENCOUNTER — Other Ambulatory Visit: Payer: Self-pay

## 2019-09-15 ENCOUNTER — Ambulatory Visit: Payer: Self-pay

## 2019-09-15 NOTE — Patient Outreach (Signed)
  [facility]) Care Management Chronic Special Needs Program    09/15/2019  Name: [name], DOB: 1956/01/22  MRN: [id number]   [name] is enrolled in a chronic special needs plan for Heart Failure. Telephone call to client's son, [name]. HIPAA verified by son for client. Son states client did well over the weekend. He reports clients grandson stayed with her.  Son reports client had 1 fall over the weekend without injury.  Son states he spoke with [name], social worker with [facility] care management. He reports that client would like to stay in her home but he doesn't have anyone that can stay with her 24 hours per day. Son states he will discuss further with her and family once mailed information received from the social worker. Son states he is unsure of client's next doctor appointment. He reports that client's brother keeps up with the appointments. Son voiced no further needs at this time.  RNCM advised son to call her or [facility] social worker, [name] for any further questions or concerns.  Per note from social worker [name], she will follow up with client and/ or family member within 1 week.  RNCM advised client to notify MD of any changes in condition prior to scheduled appointment.   PLAN: RNCM will follow up with client within 3 months.  [name] RN,BSN,CCM [place] Management [phone]

## 2019-09-22 ENCOUNTER — Other Ambulatory Visit: Payer: Self-pay | Admitting: *Deleted

## 2019-09-22 NOTE — Patient Outreach (Signed)
[facility]) Care Management  09/22/2019  [name] 15-Mar-1956 [id number]   CSW called & left a voicemail for patient's son, [name] (ph#: [phone]) and called patient's home (ph#: [phone]) spoke with patient's brother, [name] (cell ph#: [phone]) to discuss placement options for patient. [name] states that [name] has been appointed by patient to make decisions for her but that he was currently at work but should call CSW back once he gets off work. [name] informed CSW that they have spoken with [facility] about placement options and had an FL2 form completed the last time she was at the doctor and they planned to look around and tour some facilities. CSW spoke with patient and brother about Advance Directives, patient's brother states that the family spoke with patient about healthcare POA and she had decided that she wants her son, [name] to be her HCPOA and she is familiar with a living will. Advance Directive packet to be mailed to patients home by [facility] RNCM, [place] will follow-up with patient to assist with completing packet once she receives it. CSW will await returned call from patient's son to discuss placement options & Medicaid application.   Update: [name], [name] called CSW back to confirm that he had received the list of ALFs and Medicaid application. CSW encouraged son to go ahead with the Medicaid application as it could take up to 60 days to be processed. [name] to review list of ALFs with his uncle/patient's brother, [name]. CSW will follow-up with patient/patient's son in 1-2 weeks to follow-up with Advance Directives and provide further assistance with Medicaid application & placement options. CSW encouraged patient's son to call if any concerns or questions came up in the meantime.    [name], LCSW [facility]   Clinical Social Worker cell #: [phone]

## 2019-09-23 ENCOUNTER — Other Ambulatory Visit: Payer: Self-pay | Admitting: Family Medicine

## 2019-09-23 DIAGNOSIS — M545 Low back pain, unspecified: Secondary | ICD-10-CM

## 2019-09-29 ENCOUNTER — Ambulatory Visit: Payer: Self-pay

## 2019-09-29 ENCOUNTER — Telehealth: Payer: Self-pay | Admitting: Neurology

## 2019-09-29 ENCOUNTER — Ambulatory Visit: Payer: HMO | Attending: Internal Medicine

## 2019-09-29 DIAGNOSIS — Z20822 Contact with and (suspected) exposure to covid-19: Secondary | ICD-10-CM

## 2019-09-29 NOTE — Telephone Encounter (Signed)
Patient left msg with after hours about experiencing COVID symptoms. She has pink eye and a runny nose. The on call RN with after hours instructed patient to go to the ED. Thanks!

## 2019-09-30 LAB — NOVEL CORONAVIRUS, NAA: SARS-CoV-2, NAA: NOT DETECTED

## 2019-09-30 NOTE — Telephone Encounter (Signed)
This encounter was created in error - please disregard.

## 2019-10-01 ENCOUNTER — Ambulatory Visit (INDEPENDENT_AMBULATORY_CARE_PROVIDER_SITE_OTHER): Payer: HMO | Admitting: *Deleted

## 2019-10-01 ENCOUNTER — Telehealth: Payer: Self-pay

## 2019-10-01 ENCOUNTER — Other Ambulatory Visit: Payer: HMO

## 2019-10-01 DIAGNOSIS — I110 Hypertensive heart disease with heart failure: Secondary | ICD-10-CM | POA: Diagnosis not present

## 2019-10-01 DIAGNOSIS — D696 Thrombocytopenia, unspecified: Secondary | ICD-10-CM | POA: Diagnosis not present

## 2019-10-01 DIAGNOSIS — D751 Secondary polycythemia: Secondary | ICD-10-CM | POA: Diagnosis not present

## 2019-10-01 DIAGNOSIS — F329 Major depressive disorder, single episode, unspecified: Secondary | ICD-10-CM | POA: Diagnosis not present

## 2019-10-01 DIAGNOSIS — Z952 Presence of prosthetic heart valve: Secondary | ICD-10-CM | POA: Diagnosis not present

## 2019-10-01 DIAGNOSIS — M545 Low back pain: Secondary | ICD-10-CM | POA: Diagnosis not present

## 2019-10-01 DIAGNOSIS — Z95810 Presence of automatic (implantable) cardiac defibrillator: Secondary | ICD-10-CM | POA: Diagnosis not present

## 2019-10-01 DIAGNOSIS — I088 Other rheumatic multiple valve diseases: Secondary | ICD-10-CM | POA: Diagnosis not present

## 2019-10-01 DIAGNOSIS — I2510 Atherosclerotic heart disease of native coronary artery without angina pectoris: Secondary | ICD-10-CM | POA: Diagnosis not present

## 2019-10-01 DIAGNOSIS — Z86010 Personal history of colonic polyps: Secondary | ICD-10-CM | POA: Diagnosis not present

## 2019-10-01 DIAGNOSIS — F17210 Nicotine dependence, cigarettes, uncomplicated: Secondary | ICD-10-CM | POA: Diagnosis not present

## 2019-10-01 DIAGNOSIS — I5022 Chronic systolic (congestive) heart failure: Secondary | ICD-10-CM

## 2019-10-01 DIAGNOSIS — Z7982 Long term (current) use of aspirin: Secondary | ICD-10-CM | POA: Diagnosis not present

## 2019-10-01 DIAGNOSIS — I255 Ischemic cardiomyopathy: Secondary | ICD-10-CM | POA: Diagnosis not present

## 2019-10-01 DIAGNOSIS — I4891 Unspecified atrial fibrillation: Secondary | ICD-10-CM | POA: Diagnosis not present

## 2019-10-01 DIAGNOSIS — I0981 Rheumatic heart failure: Secondary | ICD-10-CM | POA: Diagnosis not present

## 2019-10-01 DIAGNOSIS — E785 Hyperlipidemia, unspecified: Secondary | ICD-10-CM | POA: Diagnosis not present

## 2019-10-01 DIAGNOSIS — M6282 Rhabdomyolysis: Secondary | ICD-10-CM | POA: Diagnosis not present

## 2019-10-01 DIAGNOSIS — I472 Ventricular tachycardia: Secondary | ICD-10-CM | POA: Diagnosis not present

## 2019-10-01 DIAGNOSIS — Z8719 Personal history of other diseases of the digestive system: Secondary | ICD-10-CM | POA: Diagnosis not present

## 2019-10-01 DIAGNOSIS — E210 Primary hyperparathyroidism: Secondary | ICD-10-CM | POA: Diagnosis not present

## 2019-10-01 DIAGNOSIS — Z9181 History of falling: Secondary | ICD-10-CM | POA: Diagnosis not present

## 2019-10-01 DIAGNOSIS — F0390 Unspecified dementia without behavioral disturbance: Secondary | ICD-10-CM | POA: Diagnosis not present

## 2019-10-01 NOTE — Telephone Encounter (Signed)
Pt notified of negative COVID-19 results. Understanding verbalized.  [name]   

## 2019-10-02 LAB — CUP PACEART REMOTE DEVICE CHECK
Battery Remaining Longevity: 30 mo
Battery Remaining Percentage: 46 %
Brady Statistic RA Percent Paced: 0 %
Brady Statistic RV Percent Paced: 100 %
Date Time Interrogation Session: 20210106103300
HighPow Impedance: 49 Ohm
Implantable Lead Implant Date: 20060630
Implantable Lead Implant Date: 20060630
Implantable Lead Implant Date: 20060630
Implantable Lead Location: 753858
Implantable Lead Location: 753859
Implantable Lead Location: 753860
Implantable Lead Model: 157
Implantable Lead Model: 4086
Implantable Lead Model: 4543
Implantable Lead Serial Number: 119717
Implantable Lead Serial Number: 131677
Implantable Lead Serial Number: 223436
Implantable Pulse Generator Implant Date: 20120131
Lead Channel Impedance Value: 1456 Ohm
Lead Channel Impedance Value: 405 Ohm
Lead Channel Impedance Value: 500 Ohm
Lead Channel Pacing Threshold Amplitude: 1 V
Lead Channel Pacing Threshold Amplitude: 1.2 V
Lead Channel Pacing Threshold Amplitude: 1.2 V
Lead Channel Pacing Threshold Pulse Width: 0.4 ms
Lead Channel Pacing Threshold Pulse Width: 0.4 ms
Lead Channel Pacing Threshold Pulse Width: 0.4 ms
Lead Channel Setting Pacing Amplitude: 2 V
Lead Channel Setting Pacing Amplitude: 2.2 V
Lead Channel Setting Pacing Amplitude: 2.4 V
Lead Channel Setting Pacing Pulse Width: 0.4 ms
Lead Channel Setting Pacing Pulse Width: 0.4 ms
Lead Channel Setting Sensing Sensitivity: 0.5 mV
Lead Channel Setting Sensing Sensitivity: 1 mV
Pulse Gen Serial Number: 498164

## 2019-10-14 ENCOUNTER — Other Ambulatory Visit: Payer: Self-pay | Admitting: *Deleted

## 2019-10-14 NOTE — Patient Outreach (Signed)
[facility]) Care Management  10/14/2019  [name] 26-Dec-1955 [id number]   CSW attempted to reach patient's son, [name] to follow-up on resources mailed to patient's home on Assisted Living Facilities, [place] application and Advance Directives but son did not answer call. CSW was able to leave a voicemail on his phone and will await returned call. [name] states that [name] works until 3:00p and should return call when off work.    [name], LCSW [facility]  Clinical Social Worker cell #: [phone]

## 2019-10-15 NOTE — Patient Outreach (Signed)
[facility] [facility]) Care Management  10/15/2019  CINTIA GLEED 04/19/1956 [id number]   CSW received returned call from patient's son, [name] who informed CSW that they have received the list of facilities, Medicaid application and advance directives packet. [name] states that they have completed the Medicaid application this weekend, he just needs to fax it or drop it off and has not gotten around to it but will plan to do that tomorrow. He explained that they were also able to look over the list and decided on [facility], they called & spoke with the admissions coordinator there who did a virtual screening and has accepted her, pending Medicaid approval. Patient also needs to get a TB test completed along with COVID vaccine. CSW provided patient's son with the phone number and web address to make an appointment for COVID vaccine ([email or web address]; phone#: [phone], option 2). Son plans to also call patient's PCP about getting a TB test as well. CSW encouraged him to go ahead and get the Medicaid application dropped off as it can take from 45-60 days to get processed.   [name] informed CSW that he is still living with patient and she seems to be doing fine for now while they wait for the Medicaid approval. Patient does not have any behaviors or wandering and family comes to stay with her while he works during the day.   CSW also spoke with patient's son about Advance Directives, they they have reviewed the packet and sat down with the family - patient has expressed that she would like her son to be her 54. CSW encouraged him to have patient complete the HCPOA and living will and take to a notary to be finalized as they are vital documents & the ALF will need them as well. CSW encouraged [name] to call CSW if any concerns or questions come up, but CSW will check back in 1 month.    [name], LCSW [facility]  Clinical Social  Worker cell #: [phone]

## 2019-10-28 ENCOUNTER — Other Ambulatory Visit: Payer: Self-pay | Admitting: Pharmacist

## 2019-10-28 NOTE — Patient Outreach (Signed)
[facility]) Care Management  10/28/2019  [name] 07-12-1956 [id number]  Patient was called to follow up on medication adherence with pill packs. HIPAA identifiers were obtained.   Patient has a past medical history significant for:  Aortic stenosis, aortic valve disorder, defibrillator placement, cardiomyopathy, CHF, hyperlipidemia, memory issues, and ventricular tachycardia.  [place] to review the patient's medications.  Medications Reviewed Today    Reviewed by Ashley Murrain, RN (Registered Nurse) on 09/05/19 at 0409  Med List Status: Complete  Medication Order Taking? Sig Documenting Provider Last Dose Status Informant  acetaminophen (TYLENOL) 325 MG tablet [id number] Yes Take 325 mg by mouth every 6 (six) hours as needed for mild pain. [provider] 09/04/2019 Unknown time Active Self  aspirin EC 81 MG tablet [id number] Yes Take 1 tablet (81 mg total) daily by mouth.  Patient taking differently: Take 81 mg by mouth daily as needed for moderate pain.    Burnell Blanks, MD unknown Active Self  carbidopa-levodopa (SINEMET IR) 25-100 MG tablet [id number] Yes Take 1 tablet twice a day at breakfast and dinner Cameron Sprang, MD 09/04/2019 1000 Active Self  carvedilol (COREG) 12.5 MG tablet [id number] Yes Take 1 tablet by mouth 2 (two) times a day. With food [provider] 09/04/2019 1000 Active Self  donepezil (ARICEPT) 10 MG tablet [id number] Yes Take 1/2 tablet daily for 2 weeks, then increase to 1 tablet daily  Patient taking differently: Take 10 mg by mouth daily.    Cameron Sprang, MD 09/04/2019 Unknown time Active Self  furosemide (LASIX) 20 MG tablet [id number] Yes Take 1 tablet by mouth daily. [provider] 09/04/2019 Unknown time Active Self  Multiple Vitamin (MULTIVITAMIN WITH MINERALS) TABS tablet [id number] Yes Take 1 tablet by mouth at bedtime. [provider] 09/03/2019 Unknown time Active  Self  olmesartan (BENICAR) 40 MG tablet [id number] Yes Take 0.5 tablets (20 mg total) by mouth daily. Burnell Blanks, MD 09/04/2019 Unknown time Active Self  Polyethyl Glycol-Propyl Glycol (SYSTANE OP) [id number] Yes Place 1-2 drops into both eyes 3 (three) times daily as needed (for dry eyes.). [provider] unknown Active Self  polyethylene glycol (MIRALAX / GLYCOLAX) packet [id number] Yes Take 17 g by mouth daily. Domenic Moras, PA-C unknown Active Self           Med Note [name]   Fri Feb 14, 2019  1:47 PM) PRN  rosuvastatin (CRESTOR) 10 MG tablet [id number] Yes Take 1 tablet (10 mg total) by mouth daily. Evans Lance, MD 09/03/2019 Unknown time Active Self  sertraline (ZOLOFT) 100 MG tablet [id number] Yes Take 1 tablet (100 mg total) by mouth daily. Cameron Sprang, MD 09/04/2019 Unknown time Active Self         Patient did not report any issues with her medications. New prescription for Olmesartan 20mg  was filled on 10/02/2019 and went out in the 10/04/2019 pill packs.  Plan: Follow up with the patient in 3-4 months. Consider closing case.  [name], PharmD, [place] Clinical Pharmacist [phone]

## 2019-11-17 ENCOUNTER — Ambulatory Visit: Payer: Self-pay | Admitting: *Deleted

## 2019-11-18 DIAGNOSIS — I509 Heart failure, unspecified: Secondary | ICD-10-CM | POA: Diagnosis not present

## 2019-11-18 DIAGNOSIS — G20 Parkinson's disease: Secondary | ICD-10-CM | POA: Diagnosis not present

## 2019-11-18 DIAGNOSIS — E7800 Pure hypercholesterolemia, unspecified: Secondary | ICD-10-CM | POA: Diagnosis not present

## 2019-11-18 DIAGNOSIS — F410 Panic disorder [episodic paroxysmal anxiety] without agoraphobia: Secondary | ICD-10-CM | POA: Diagnosis not present

## 2019-11-18 DIAGNOSIS — F0390 Unspecified dementia without behavioral disturbance: Secondary | ICD-10-CM | POA: Diagnosis not present

## 2019-11-18 DIAGNOSIS — N393 Stress incontinence (female) (male): Secondary | ICD-10-CM | POA: Diagnosis not present

## 2019-11-20 ENCOUNTER — Ambulatory Visit: Payer: HMO | Admitting: Pharmacist

## 2019-12-02 ENCOUNTER — Telehealth: Payer: Self-pay | Admitting: *Deleted

## 2019-12-02 NOTE — Patient Outreach (Signed)
[place] [facility]) Care Management  12/02/2019  [name] 1955-11-20 [id number]   CSW attempted to reach patient's son, [name] to follow-up on placement process, but no answer. CSW was unable to leave a voicemail on his cellphone as his mailbox was full. CSW was able to leave a voicemail on patient's home phone. CSW called [facility] and spoke with the admissions coordinator there who confirmed that patient was indeed admitted 2/15, and has been adjusting well to the placement. CSW will close case as patient is now residing at [facility].    [name], LCSW [facility]  Clinical Social Worker cell #: [phone]

## 2019-12-03 ENCOUNTER — Ambulatory Visit: Payer: Self-pay

## 2019-12-03 ENCOUNTER — Other Ambulatory Visit: Payer: Self-pay

## 2019-12-03 NOTE — Patient Outreach (Signed)
  [facility]) Care Management Chronic Special Needs Program   12/03/2019  Name: [name], DOB: 04-12-1956  MRN: [id number]  The client was discussed in today's interdisciplinary care team meeting.  The following issues were discussed:  Client's needs, Changes in health status, Key risk triggers/risk stratification, Care Plan, Coordination of care, Care transitions and Issues/barriers to care  Participants present:   [name], RN, BSN, MS, CCM  [name], BSN, CCM   [employer], BSN, CCM, CDE  Thea Silversmith, BSN, MSN, CCM  Jacqlyn Larsen, BSN, CCM   [facility], CBCC/ CMAA   Dr. Maryella Shivers   Roney Mans, [place], [place] (HTA)  Karrie Meres, Pharm D (HTA)  Linus Orn, Government social research officer, CSNP (HTA)         Recommendations:   Assist client with coordinating care for ongoing provider follow up visits and wellness check.   Plan:  RNCM will continue to follow and reach out to client at next scheduled outreach.   [name] RN,BSN,CCM [place] [facility] [phone]

## 2019-12-03 NOTE — Patient Outreach (Addendum)
  [facility]) Care Management Chronic Special Needs Program    12/03/2019  Name: [name], DOB: 1956/06/03  MRN: [id number]   [name] is enrolled in a chronic special needs plan for Diabetes. Telephone call to client. Unable to reach. HIPAA compliant voice message left with call back phone number and return call request. Telephone call to client's son, [name]. Unable to reach. HIPAA compliant voice message left with call back phone number.  Call to client's primary MD office. Spoke with Candice. Requested return call from primary care provider nurse to discuss follow up visit and next recommended annual wellness exam.   PLAN; RNCM will attempt 2nd  telephone call to client within 1 week.     [name] RN,BSN,CCM [place] [facility] [phone]

## 2019-12-08 ENCOUNTER — Other Ambulatory Visit: Payer: Self-pay

## 2019-12-08 NOTE — Patient Outreach (Signed)
  [facility]) Care Management Chronic Special Needs Program    12/08/2019  Name: [name], DOB: [birth year]  MRN: [id number]   [name] is enrolled in a chronic special needs plan for Heart Failure. Call to client for assessment follow up. Unable to reach. HIPAA compliant voice message left with call back phone number.  Call to client's son, [name]. Unable to reach or leave voice message due to mailbox being full.    PLAN; RNCM will attempt 3rd telephone outreach to client and/ or son within 1 week.   [name] RN,BSN,CCM [place] [facility] [phone]

## 2019-12-09 ENCOUNTER — Other Ambulatory Visit: Payer: Self-pay

## 2019-12-09 NOTE — Patient Outreach (Signed)
  [facility]) Care Management Chronic Special Needs Program    12/09/2019  Name: [name], DOB: [birth year]  MRN: [id number]   [name] is enrolled in a chronic special needs plan for Heart Failure. Telephone call to client's primary care provider office. Discussed with Danae Chen that client has not had recent follow up and/ or labs with her primary care provider. Requested outreach assistance from primary provider office to client for follow up appointment.   PLAN; RNCM will follow up with client at next scheduled outreach.   [name] RN,BSN,CCM [place] [facility] [phone]

## 2019-12-16 ENCOUNTER — Ambulatory Visit: Payer: Self-pay

## 2019-12-17 ENCOUNTER — Other Ambulatory Visit: Payer: Self-pay

## 2019-12-17 NOTE — Patient Outreach (Signed)
[facility]) Care Management Chronic Special Needs Program  12/17/2019  Name: [name] DOB: Sep 29, 1955  MRN: [id number]  [name] is enrolled in a chronic special needs plan for Heart Failure. Reviewed and updated care plan.  Subjective: Telephone call to client.  Client's brother and designated party release, [name] requested RNCM contact client's son, [name].  RNCM contact client's son/ designated party release, [name]. Son states client is doing ok at her new living facility. Son states client still has days that she is more confused than others. He reports client has fallen a couple of times since being at the facility but has not sustained injury. Son states he has to provide additional information to social services for client's medicaid approval. He reports he does not have the Advanced care packet at this time and request another packet be sent to client's home.  Son states he is not sure of the process for getting client to her doctor appointments since she is in the assisted living facility. Son states client continues to get her medications from [employer] and they deliver to the assisted living facility.   RNCM contacted [facility] and spoke with resident Freight forwarder, Jenny Reichmann. Jenny Reichmann states that clients will be seen by the in house primary care provider with [facility] primary care.  She reports this will be client's new primary care provider.  She states clients are seen every 3 months or as needed.  Jenny Reichmann states a lab company provides services every week at the facility.  Jenny Reichmann reports the facility also has a podiatrist, eye doctor, and dentist that sees clients in house. She states the facility can provide transportation for clients to their doctors appointments and someone from the facility will accompany them.  RNCM returned call to client's son and informed him of the discussion with Jenny Reichmann, resident Freight forwarder and  information she provided. Son expressed appreciation to [facility] for the information. Son advised he would need to schedule client's specialist visits for follow up. Son verbalized understanding.   Goals Addressed            This Visit's Progress   .  Acknowledge receipt of Advanced Directive package   Not on track    Son states Advanced directive packet not received RN care manager will resend Advanced directive packet Son advised to contact RNCM [phone])  for social work referral if assistance is needed with Advanced Directive     . Client understands the importance of follow-up with providers by attending scheduled visits   On track    Last primary care provider visit 09/10/19 Continue to maintain and keep follow up visits with your providers.      . COMPLETED: Client will report improved coping within 3 months       [facility] social worker has assisted client with depression/ anxiety needs and placement.  Client admitted to [facility] assisted living facility on 11/10/19.     Marland Kitchen Client will report no fall or injuries within the next 3 months   Not on track    Son reports client has fallen (without injury) since admission to [facility] assisted living Client admitted to [facility] assisted living facility on 11/09/18 RN care manager will send client education article:  Preventing falls in older adults.       . Client will verbalize knowledge of self management of Hypertension as evidences by BP reading of 140/90 or less; or as defined by provider  On track    Client admitted to [facility] assisted living facility. [facility] provides in house primary care provider with [facility] primary care ) Continue to take your medications as prescribed.  Contact your doctor if you have questions Follow up with your doctor as recommended.     . Client/Caregiver will verbalize understanding of instructions related to self-care and safety (pt-stated)   On track    Client admitted  to [facility] assisted living on 11/10/19 RN Case manager will send client education article: Preventing falls in older adults.       . Decrease inpatient Heart Failure admissions/ readmissions with in the year   On track    Ongoing goal Son reports client has not had a recent hospitalization. Continue to follow up with your provider as recommended Take your medications as prescribed.  Contact your doctor if you have questions.     . Decrease the use of hospital emergency department related to heart failure within the next year   On track    Ongoing goal Client resides at [facility] assisted living facility with in house doctor.  Son reports client has not had any recent emergency room visits.  Take your medication as prescribed.  Follow up with your doctor as recommended.  Call your doctor if you have questions/ concerns.       . Maintain timely refills of Heart Failure medication as prescribed within the year    On track    Review of clients medical record indicates client maintains timely refills of heart failure medications Continue to take your medications as prescribed.  Follow up with your doctor if you have questions Contact your assigned RN case [phone] you have difficulty obtaining medications    . Obtain annual  Lipid Profile, LDL-C   On track    Lipid panel completed 10/14/18 The goal for LDL is less than 70 mg/ dl as you are at high risk for complications try to avoid saturated fats, trans-fats, and eat more fiber RN case manager will send client education article: Heart healthy diet.     . Visit Primary Care Provider or Cardiologist at least 2 times per year   On track    Last Primary care provider visit 09/10/19 ( client currently sees assisted in house doctor for primary care follow up) Last Cardiology visit 07/02/19 Continue to follow up with your providers as recommended.      ASSESSMENT; Care manager will continue to assist client  and/or son with care coordination as needed.    Plan:  Send successful outreach letter with a copy of their individualized care plan, Send individual care plan to provider and Send educational material  Chronic care management coordinator will outreach in:  6 Months  RNCM will send client Advanced directive packet.  RNCM will refer client to pharmacy for medication review.   [name] RN,BSN,CCM Chronic Care Management Coordinator [place], [place] [facility] [phone]         .

## 2019-12-22 ENCOUNTER — Inpatient Hospital Stay (HOSPITAL_COMMUNITY)
Admission: EM | Admit: 2019-12-22 | Discharge: 2019-12-26 | DRG: 312 | Disposition: A | Discharge status: Home / Self Care | Payer: HMO | Source: Skilled Nursing Facility | Attending: Internal Medicine | Admitting: Internal Medicine

## 2019-12-22 ENCOUNTER — Observation Stay (HOSPITAL_COMMUNITY): Payer: HMO

## 2019-12-22 ENCOUNTER — Other Ambulatory Visit: Payer: Self-pay

## 2019-12-22 ENCOUNTER — Emergency Department (HOSPITAL_COMMUNITY): Payer: HMO

## 2019-12-22 DIAGNOSIS — I951 Orthostatic hypotension: Secondary | ICD-10-CM | POA: Diagnosis present

## 2019-12-22 DIAGNOSIS — R4182 Altered mental status, unspecified: Secondary | ICD-10-CM | POA: Diagnosis not present

## 2019-12-22 DIAGNOSIS — Z79899 Other long term (current) drug therapy: Secondary | ICD-10-CM | POA: Diagnosis not present

## 2019-12-22 DIAGNOSIS — E861 Hypovolemia: Secondary | ICD-10-CM | POA: Diagnosis present

## 2019-12-22 DIAGNOSIS — I5032 Chronic diastolic (congestive) heart failure: Secondary | ICD-10-CM | POA: Diagnosis present

## 2019-12-22 DIAGNOSIS — Z03818 Encounter for observation for suspected exposure to other biological agents ruled out: Secondary | ICD-10-CM | POA: Diagnosis not present

## 2019-12-22 DIAGNOSIS — Z95810 Presence of automatic (implantable) cardiac defibrillator: Secondary | ICD-10-CM | POA: Diagnosis not present

## 2019-12-22 DIAGNOSIS — E210 Primary hyperparathyroidism: Secondary | ICD-10-CM | POA: Diagnosis not present

## 2019-12-22 DIAGNOSIS — I4891 Unspecified atrial fibrillation: Secondary | ICD-10-CM | POA: Diagnosis not present

## 2019-12-22 DIAGNOSIS — F0280 Dementia in other diseases classified elsewhere without behavioral disturbance: Secondary | ICD-10-CM | POA: Diagnosis not present

## 2019-12-22 DIAGNOSIS — Z8249 Family history of ischemic heart disease and other diseases of the circulatory system: Secondary | ICD-10-CM | POA: Diagnosis not present

## 2019-12-22 DIAGNOSIS — N289 Disorder of kidney and ureter, unspecified: Secondary | ICD-10-CM

## 2019-12-22 DIAGNOSIS — E872 Acidosis: Secondary | ICD-10-CM | POA: Diagnosis present

## 2019-12-22 DIAGNOSIS — E785 Hyperlipidemia, unspecified: Secondary | ICD-10-CM | POA: Diagnosis present

## 2019-12-22 DIAGNOSIS — I428 Other cardiomyopathies: Secondary | ICD-10-CM | POA: Diagnosis present

## 2019-12-22 DIAGNOSIS — I959 Hypotension, unspecified: Secondary | ICD-10-CM | POA: Diagnosis not present

## 2019-12-22 DIAGNOSIS — I110 Hypertensive heart disease with heart failure: Secondary | ICD-10-CM | POA: Diagnosis present

## 2019-12-22 DIAGNOSIS — D696 Thrombocytopenia, unspecified: Secondary | ICD-10-CM | POA: Diagnosis present

## 2019-12-22 DIAGNOSIS — R531 Weakness: Secondary | ICD-10-CM | POA: Diagnosis not present

## 2019-12-22 DIAGNOSIS — F17210 Nicotine dependence, cigarettes, uncomplicated: Secondary | ICD-10-CM | POA: Diagnosis present

## 2019-12-22 DIAGNOSIS — R4020 Unspecified coma: Secondary | ICD-10-CM | POA: Diagnosis not present

## 2019-12-22 DIAGNOSIS — G20 Parkinson's disease: Secondary | ICD-10-CM | POA: Diagnosis present

## 2019-12-22 DIAGNOSIS — F410 Panic disorder [episodic paroxysmal anxiety] without agoraphobia: Secondary | ICD-10-CM | POA: Diagnosis not present

## 2019-12-22 DIAGNOSIS — G20A1 Parkinson's disease without dyskinesia, without mention of fluctuations: Secondary | ICD-10-CM | POA: Diagnosis present

## 2019-12-22 DIAGNOSIS — F0150 Vascular dementia without behavioral disturbance: Secondary | ICD-10-CM | POA: Diagnosis present

## 2019-12-22 DIAGNOSIS — R519 Headache, unspecified: Secondary | ICD-10-CM | POA: Diagnosis not present

## 2019-12-22 DIAGNOSIS — R55 Syncope and collapse: Secondary | ICD-10-CM | POA: Diagnosis present

## 2019-12-22 DIAGNOSIS — R000 Tachycardia, unspecified: Secondary | ICD-10-CM | POA: Diagnosis not present

## 2019-12-22 DIAGNOSIS — S299XXA Unspecified injury of thorax, initial encounter: Secondary | ICD-10-CM | POA: Diagnosis not present

## 2019-12-22 DIAGNOSIS — Z7982 Long term (current) use of aspirin: Secondary | ICD-10-CM | POA: Diagnosis not present

## 2019-12-22 DIAGNOSIS — Z20822 Contact with and (suspected) exposure to covid-19: Secondary | ICD-10-CM | POA: Diagnosis present

## 2019-12-22 DIAGNOSIS — Z952 Presence of prosthetic heart valve: Secondary | ICD-10-CM | POA: Diagnosis not present

## 2019-12-22 DIAGNOSIS — D649 Anemia, unspecified: Secondary | ICD-10-CM | POA: Diagnosis present

## 2019-12-22 DIAGNOSIS — Z953 Presence of xenogenic heart valve: Secondary | ICD-10-CM

## 2019-12-22 DIAGNOSIS — I2510 Atherosclerotic heart disease of native coronary artery without angina pectoris: Secondary | ICD-10-CM | POA: Diagnosis present

## 2019-12-22 DIAGNOSIS — S199XXA Unspecified injury of neck, initial encounter: Secondary | ICD-10-CM | POA: Diagnosis not present

## 2019-12-22 DIAGNOSIS — R410 Disorientation, unspecified: Secondary | ICD-10-CM | POA: Diagnosis not present

## 2019-12-22 DIAGNOSIS — E7800 Pure hypercholesterolemia, unspecified: Secondary | ICD-10-CM | POA: Diagnosis not present

## 2019-12-22 DIAGNOSIS — Z888 Allergy status to other drugs, medicaments and biological substances status: Secondary | ICD-10-CM | POA: Diagnosis not present

## 2019-12-22 DIAGNOSIS — Z7401 Bed confinement status: Secondary | ICD-10-CM | POA: Diagnosis not present

## 2019-12-22 DIAGNOSIS — M2550 Pain in unspecified joint: Secondary | ICD-10-CM | POA: Diagnosis not present

## 2019-12-22 DIAGNOSIS — I509 Heart failure, unspecified: Secondary | ICD-10-CM | POA: Diagnosis not present

## 2019-12-22 DIAGNOSIS — D72829 Elevated white blood cell count, unspecified: Secondary | ICD-10-CM | POA: Diagnosis not present

## 2019-12-22 DIAGNOSIS — F0390 Unspecified dementia without behavioral disturbance: Secondary | ICD-10-CM | POA: Diagnosis not present

## 2019-12-22 DIAGNOSIS — F329 Major depressive disorder, single episode, unspecified: Secondary | ICD-10-CM | POA: Diagnosis present

## 2019-12-22 LAB — CBC WITH DIFFERENTIAL/PLATELET
Abs Immature Granulocytes: 0 10*3/uL (ref 0.00–0.07)
Basophils Absolute: 0 10*3/uL (ref 0.0–0.1)
Basophils Relative: 0 %
Eosinophils Absolute: 0 10*3/uL (ref 0.0–0.5)
Eosinophils Relative: 1 %
HCT: 44.7 % (ref 36.0–46.0)
Hemoglobin: 14.1 g/dL (ref 12.0–15.0)
Immature Granulocytes: 0 %
Lymphocytes Relative: 49 %
Lymphs Abs: 1.8 10*3/uL (ref 0.7–4.0)
MCH: 28.5 pg (ref 26.0–34.0)
MCHC: 31.5 g/dL (ref 30.0–36.0)
MCV: 90.5 fL (ref 80.0–100.0)
Monocytes Absolute: 0.5 10*3/uL (ref 0.1–1.0)
Monocytes Relative: 13 %
Neutro Abs: 1.4 10*3/uL — ABNORMAL LOW (ref 1.7–7.7)
Neutrophils Relative %: 37 %
Platelets: 140 10*3/uL — ABNORMAL LOW (ref 150–400)
RBC: 4.94 MIL/uL (ref 3.87–5.11)
RDW: 15.9 % — ABNORMAL HIGH (ref 11.5–15.5)
WBC: 3.7 10*3/uL — ABNORMAL LOW (ref 4.0–10.5)
nRBC: 0 % (ref 0.0–0.2)

## 2019-12-22 LAB — CBG MONITORING, ED: Glucose-Capillary: 93 mg/dL (ref 70–99)

## 2019-12-22 LAB — TROPONIN I (HIGH SENSITIVITY)
Troponin I (High Sensitivity): 8 ng/L (ref <18)
Troponin I (High Sensitivity): 9 ng/L (ref <18)

## 2019-12-22 LAB — URINALYSIS, ROUTINE W REFLEX MICROSCOPIC
Bilirubin Urine: NEGATIVE
Glucose, UA: NEGATIVE mg/dL
Hgb urine dipstick: NEGATIVE
Ketones, ur: NEGATIVE mg/dL
Leukocytes,Ua: NEGATIVE
Nitrite: NEGATIVE
Protein, ur: 100 mg/dL — AB
Specific Gravity, Urine: 1.017 (ref 1.005–1.030)
pH: 6 (ref 5.0–8.0)

## 2019-12-22 LAB — CK: Total CK: 138 U/L (ref 38–234)

## 2019-12-22 LAB — BASIC METABOLIC PANEL
Anion gap: 10 (ref 5–15)
BUN: 21 mg/dL (ref 8–23)
CO2: 21 mmol/L — ABNORMAL LOW (ref 22–32)
Calcium: 9.7 mg/dL (ref 8.9–10.3)
Chloride: 106 mmol/L (ref 98–111)
Creatinine, Ser: 1.06 mg/dL — ABNORMAL HIGH (ref 0.44–1.00)
GFR calc Af Amer: >60 mL/min (ref >60)
GFR calc non Af Amer: 55 mL/min — ABNORMAL LOW (ref >60)
Glucose, Bld: 77 mg/dL (ref 70–99)
Potassium: 4.9 mmol/L (ref 3.5–5.1)
Sodium: 137 mmol/L (ref 135–145)

## 2019-12-22 LAB — SARS CORONAVIRUS 2 (TAT 6-24 HRS): SARS Coronavirus 2: NEGATIVE

## 2019-12-22 LAB — TSH: TSH: 2.156 u[IU]/mL (ref 0.350–4.500)

## 2019-12-22 MED ORDER — ASPIRIN EC 81 MG PO TBEC
81.0000 mg | DELAYED_RELEASE_TABLET | Freq: Every day | ORAL | Status: DC
  Administered 2019-12-22 – 2019-12-26 (×5): 81 mg via ORAL
  Filled 2019-12-22 (×5): qty 1

## 2019-12-22 MED ORDER — SERTRALINE HCL 100 MG PO TABS
100.0000 mg | ORAL_TABLET | Freq: Every day | ORAL | Status: DC
  Administered 2019-12-23 – 2019-12-26 (×4): 100 mg via ORAL
  Filled 2019-12-22 (×5): qty 1

## 2019-12-22 MED ORDER — POLYETHYL GLYCOL-PROPYL GLYCOL 0.4-0.3 % OP GEL: Freq: Three times a day (TID) | OPHTHALMIC | Status: DC | PRN

## 2019-12-22 MED ORDER — CARVEDILOL 12.5 MG PO TABS
12.5000 mg | ORAL_TABLET | Freq: Two times a day (BID) | ORAL | Status: DC
  Administered 2019-12-23 – 2019-12-26 (×6): 12.5 mg via ORAL
  Filled 2019-12-22 (×7): qty 1

## 2019-12-22 MED ORDER — ENOXAPARIN SODIUM 40 MG/0.4ML ~~LOC~~ SOLN
40.0000 mg | SUBCUTANEOUS | Status: DC
  Administered 2019-12-22 – 2019-12-25 (×3): 40 mg via SUBCUTANEOUS
  Filled 2019-12-22 (×3): qty 0.4

## 2019-12-22 MED ORDER — ROSUVASTATIN CALCIUM 5 MG PO TABS
10.0000 mg | ORAL_TABLET | Freq: Every day | ORAL | Status: DC
  Administered 2019-12-22 – 2019-12-26 (×5): 10 mg via ORAL
  Filled 2019-12-22 (×5): qty 2

## 2019-12-22 MED ORDER — ACETAMINOPHEN 325 MG PO TABS
650.0000 mg | ORAL_TABLET | Freq: Four times a day (QID) | ORAL | Status: DC | PRN
  Administered 2019-12-24 – 2019-12-26 (×3): 650 mg via ORAL
  Filled 2019-12-22 (×3): qty 2

## 2019-12-22 MED ORDER — IRBESARTAN 150 MG PO TABS
150.0000 mg | ORAL_TABLET | Freq: Every day | ORAL | Status: DC
  Administered 2019-12-22 – 2019-12-23 (×2): 150 mg via ORAL
  Filled 2019-12-22 (×3): qty 1

## 2019-12-22 MED ORDER — SODIUM CHLORIDE 0.9 % IV BOLUS
500.0000 mL | Freq: Once | INTRAVENOUS | Status: AC
  Administered 2019-12-22: 500 mL via INTRAVENOUS

## 2019-12-22 MED ORDER — POLYETHYLENE GLYCOL 3350 17 G PO PACK
17.0000 g | PACK | Freq: Every day | ORAL | Status: DC
  Administered 2019-12-22 – 2019-12-26 (×5): 17 g via ORAL
  Filled 2019-12-22 (×5): qty 1

## 2019-12-22 MED ORDER — SODIUM CHLORIDE 0.9 % IV SOLN: INTRAVENOUS | Status: DC

## 2019-12-22 MED ORDER — SODIUM CHLORIDE 0.9% FLUSH
3.0000 mL | Freq: Two times a day (BID) | INTRAVENOUS | Status: DC
  Administered 2019-12-24 – 2019-12-26 (×5): 3 mL via INTRAVENOUS

## 2019-12-22 MED ORDER — DONEPEZIL HCL 10 MG PO TABS
10.0000 mg | ORAL_TABLET | Freq: Every day | ORAL | Status: DC
  Administered 2019-12-22 – 2019-12-26 (×5): 10 mg via ORAL
  Filled 2019-12-22 (×5): qty 1
  Filled 2019-12-22: qty 2

## 2019-12-22 MED ORDER — CARBIDOPA-LEVODOPA 25-100 MG PO TABS
1.0000 | ORAL_TABLET | Freq: Two times a day (BID) | ORAL | Status: DC
  Administered 2019-12-22 – 2019-12-26 (×8): 1 via ORAL
  Filled 2019-12-22 (×10): qty 1

## 2019-12-22 MED ORDER — ACETAMINOPHEN 650 MG RE SUPP: 650.0000 mg | Freq: Four times a day (QID) | RECTAL | Status: DC | PRN

## 2019-12-22 MED ORDER — ALBUTEROL SULFATE (2.5 MG/3ML) 0.083% IN NEBU: 2.5000 mg | INHALATION_SOLUTION | Freq: Four times a day (QID) | RESPIRATORY_TRACT | Status: DC | PRN

## 2019-12-22 NOTE — H&P (Signed)
History and Physical    [name] JGG:[id number] DOB: 1956/02/14 DOA: 12/22/2019  Referring MD/NP/PA: Charmaine Downs, PA-C PCP: Lujean Amel, MD  Patient coming from: [facility] via EMS  Chief Complaint: Passed out  I have personally briefly reviewed patient's old medical records in [place]   HPI: [name] is a 65 y.o. female with medical history significant of HTN, HLD, aortic stenosis s/p bioprosthetic AVR, NICM s/p AICD, chronic diastolic CHF(EF 54-65% with grade 1 diastolic dysfunction by TTE on 09/05/2019), CAD, primary hyperparathyroidism, Parkinson's disease, and mild dementia presents after having several episodes of syncope.  History is obtained from the patient, her son, and along with review of records.  The patient states that she had been sitting in the living room at the assisted living facility eating chips when the nurse had told her that her eyes rolled back and she fell out of the stool she was sitting on.  There were no reports of trauma to her head.  The other report noted patient was ambulating at the time of the episode and she was noted to be initially hypotensive 89/67.  The patient son notes that over the last several months that this has been occurring once or twice per week.  He reports that she has temporary shaking of her upper extremities gave him concern for possible seizure.  Patient denies any loss of bowel or bladder or tongue biting.  She is usually back to herself after episodes occur.  Denies having any palpitations, chest pain, nausea, vomiting, or diarrhea symptoms.  The only associated symptoms are reports of complaints of fatigue.  Her son reports that normally she sleeps throughout the day, but does not keep her self adequately hydrated.  In route with EMS patient had another syncopal episode, but was reported to have several prior to EMSs arrival.  Patient had been admitted to the hospital back in 08/2019 with similar symptoms that  were thought secondary to orthostatic hypotension   ED Course: Upon admission into the emergency department patient was noted to be afebrile vital signs relatively within normal limits.  Labs significant for WBC 3.7, platelets 140, BUN 21, creatinine 1.06, and glucose 77.  Her ICD was interrogated and noted to be functioning properly.  CT scan of the head and cervical spine did not note any acute abnormalities.  Patient was given 500 mL bolus of normal saline IV fluids.  TRH called to admit.  Review of Systems  Constitutional: Positive for malaise/fatigue. Negative for fever.  HENT: Negative for ear discharge and nosebleeds.   Eyes: Negative for photophobia and pain.  Respiratory: Negative for cough and shortness of breath.   Cardiovascular: Negative for chest pain and leg swelling.  Gastrointestinal: Negative for abdominal pain and vomiting.  Genitourinary: Negative for dysuria and hematuria.  Musculoskeletal: Positive for falls. Negative for joint pain.  Neurological: Positive for loss of consciousness.  Psychiatric/Behavioral: Positive for memory loss. Negative for substance abuse.    Past Medical History:  Diagnosis Date  . Abnormal liver function tests   . AICD (automatic cardioverter/defibrillator) present    [employer]  . Anxiety   . Aortic stenosis   . Benign neoplasm of colon 04/19/2001   Hyperplastic  . Bicuspid aortic valve   . Cardiomyopathy secondary   . CHF (congestive heart failure) ([place])   . Coronary artery disease   . Depression   . Headache   . Heart murmur   . HTN (hypertension)   .  Hypercalcemia   . Hyperlipidemia   . Memory changes 06/2018   in the last year /per the brother  . S/P ICD (internal cardiac defibrillator) procedure 2018    Past Surgical History:  Procedure Laterality Date  . AORTIC VALVE REPLACEMENT N/A 11/16/2016   Procedure: AORTIC VALVE REPLACEMENT (AVR);  Surgeon: Gaye Pollack, MD;  Location: [facility];  Service: Open Heart  Surgery;  Laterality: N/A;  69mm Perimount Magna Ease Pericardial Bioprosthesis  . BREAST EXCISIONAL BIOPSY Left 1984   benign  . CARDIAC CATHETERIZATION N/A 07/05/2016   Procedure: Right/Left Heart Cath and Coronary Angiography;  Surgeon: Burnell Blanks, MD;  Location: [facility];  Service: Cardiovascular;  Laterality: N/A;  . CHOLECYSTECTOMY    . implantation of a Guidant biventricular ICD    . laparoscopic cholecystectomy with intraoperative cholangiogram  2003  . TEE WITHOUT CARDIOVERSION N/A 11/16/2016   Procedure: TRANSESOPHAGEAL ECHOCARDIOGRAM (TEE);  Surgeon: Gaye Pollack, MD;  Location: [facility];  Service: Open Heart Surgery;  Laterality: N/A;     reports that she has been smoking cigarettes. She has a 10.00 pack-year smoking history. She has never used smokeless tobacco. She reports current alcohol use. She reports that she does not use drugs.  Allergies  Allergen Reactions  . Trospium Chloride Er Other (See Comments)    Constipation     Family History  Problem Relation Age of Onset  . Pancreatic cancer Mother   . Colon cancer Maternal Aunt   . Bone cancer Brother   . Heart attack Brother   . Hypercalcemia Neg Hx     Prior to Admission medications   Medication Sig Start Date End Date Taking? Authorizing Provider  acetaminophen (TYLENOL) 325 MG tablet Take 325 mg by mouth every 6 (six) hours as needed for mild pain.    [provider]  aspirin EC 81 MG tablet Take 1 tablet (81 mg total) daily by mouth. Patient taking differently: Take 81 mg by mouth daily as needed for moderate pain.  08/10/17   Burnell Blanks, MD  carbidopa-levodopa (SINEMET IR) 25-100 MG tablet Take 1 tablet twice a day at breakfast and dinner 02/28/19   Cameron Sprang, MD  carvedilol (COREG) 12.5 MG tablet Take 1 tablet by mouth 2 (two) times a day. With food 12/16/18   [provider]  donepezil (ARICEPT) 10 MG tablet Take 1/2 tablet daily for 2 weeks, then  increase to 1 tablet daily Patient taking differently: Take 10 mg by mouth daily.  02/28/19   Cameron Sprang, MD  furosemide (LASIX) 20 MG tablet Take 1 tablet by mouth daily. 01/08/19   [provider]  Multiple Vitamin (MULTIVITAMIN WITH MINERALS) TABS tablet Take 1 tablet by mouth at bedtime.    [provider]  olmesartan (BENICAR) 20 MG tablet Take 20 mg by mouth daily. 10/04/19   [provider]  Polyethyl Glycol-Propyl Glycol (SYSTANE OP) Place 1-2 drops into both eyes 3 (three) times daily as needed (for dry eyes.).    [provider]  polyethylene glycol (MIRALAX / GLYCOLAX) packet Take 17 g by mouth daily. 04/05/18   Domenic Moras, PA-C  rosuvastatin (CRESTOR) 10 MG tablet Take 1 tablet (10 mg total) by mouth daily. 05/01/11   Evans Lance, MD  sertraline (ZOLOFT) 100 MG tablet Take 1 tablet (100 mg total) by mouth daily. 02/28/19   Cameron Sprang, MD    Physical Exam:  Constitutional: Elderly female who appears to be  in NAD, calm, comfortable Vitals:   12/22/19 1215 12/22/19 1230 12/22/19 1245 12/22/19 1400  BP: (!) 108/91 (!) 107/94 139/85 (!) 126/97  Pulse:      Resp: 16 (!) 22 18 14   Temp:      TempSrc:      SpO2:      Weight:      Height:       Eyes: PERRL, lids and conjunctivae normal ENMT: Mucous membranes are moist. Posterior pharynx clear of any exudate or lesions.  Neck: normal, supple, no masses, no thyromegaly Respiratory: clear to auscultation bilaterally, no wheezing, no crackles. Normal respiratory effort. No accessory muscle use.  Cardiovascular: Regular rate and rhythm, no murmurs / rubs / gallops. No extremity edema. 2+ pedal pulses. No carotid bruits.  Abdomen: no tenderness, no masses palpated. No hepatosplenomegaly. Bowel sounds positive.  Musculoskeletal: no clubbing / cyanosis. No joint deformity upper and lower extremities. Good ROM, no contractures. Normal muscle tone.  Skin: no rashes, lesions, ulcers. No  induration Neurologic: CN 2-12 grossly intact. Sensation intact, DTR normal. Strength 5/5 in all 4.  Psychiatric: Normal judgment and insight. Alert and oriented x 3. Normal mood.     Labs on Admission: I have personally reviewed following labs and imaging studies  CBC: Recent Labs  Lab 12/22/19 1220  WBC 3.7*  NEUTROABS 1.4*  HGB 14.1  HCT 44.7  MCV 90.5  PLT 353*   Basic Metabolic Panel: Recent Labs  Lab 12/22/19 1220  NA 137  K 4.9  CL 106  CO2 21*  GLUCOSE 77  BUN 21  CREATININE 1.06*  CALCIUM 9.7   GFR: Estimated Creatinine Clearance: 45.7 mL/min (A) (by C-G formula based on SCr of 1.06 mg/dL (H)). Liver Function Tests: No results for input(s): AST, ALT, ALKPHOS, BILITOT, PROT, ALBUMIN in the last 168 hours. No results for input(s): LIPASE, AMYLASE in the last 168 hours. No results for input(s): AMMONIA in the last 168 hours. Coagulation Profile: No results for input(s): INR, PROTIME in the last 168 hours. Cardiac Enzymes: No results for input(s): CKTOTAL, CKMB, CKMBINDEX, TROPONINI in the last 168 hours. BNP (last 3 results) No results for input(s): PROBNP in the last 8760 hours. HbA1C: No results for input(s): HGBA1C in the last 72 hours. CBG: No results for input(s): GLUCAP in the last 168 hours. Lipid Profile: No results for input(s): CHOL, HDL, LDLCALC, TRIG, CHOLHDL, LDLDIRECT in the last 72 hours. Thyroid Function Tests: No results for input(s): TSH, T4TOTAL, FREET4, T3FREE, THYROIDAB in the last 72 hours. Anemia Panel: No results for input(s): VITAMINB12, FOLATE, FERRITIN, TIBC, IRON, RETICCTPCT in the last 72 hours. Urine analysis:    Component Value Date/Time   COLORURINE YELLOW 12/22/2019 1233   APPEARANCEUR CLEAR 12/22/2019 1233   LABSPEC 1.017 12/22/2019 1233   PHURINE 6.0 12/22/2019 1233   GLUCOSEU NEGATIVE 12/22/2019 1233   HGBUR NEGATIVE 12/22/2019 1233   BILIRUBINUR NEGATIVE 12/22/2019 1233   KETONESUR NEGATIVE 12/22/2019 1233    PROTEINUR 100 (A) 12/22/2019 1233   NITRITE NEGATIVE 12/22/2019 1233   LEUKOCYTESUR NEGATIVE 12/22/2019 1233   Sepsis Labs: No results found for this or any previous visit (from the past 240 hour(s)).   Radiological Exams on Admission: CT Head Wo Contrast  Result Date: 12/22/2019 CLINICAL DATA:  Headache EXAM: CT HEAD WITHOUT CONTRAST TECHNIQUE: Contiguous axial images were obtained from the base of the skull through the vertex without intravenous contrast. COMPARISON:  06/13/2018 FINDINGS: Brain: No evidence of acute infarction, hemorrhage, hydrocephalus, extra-axial collection  or mass lesion/mass effect. Remote left basal ganglia lacunar infarct. Extensive low-density changes within the periventricular and subcortical white matter compatible with chronic microvascular ischemic change. Mild-moderate diffuse cerebral volume loss. Vascular: Atherosclerotic calcifications involving the large vessels of the skull base. No unexpected hyperdense vessel. Skull: Normal. Negative for fracture or focal lesion. Sinuses/Orbits: Partial opacification involving the left mastoid air cells. Right mastoid air cells are clear. Paranasal sinuses are clear. Orbital structures unremarkable. Other: None. IMPRESSION: 1. No acute intracranial findings. 2. Chronic microvascular ischemic changes and cerebral volume loss, similar to previous study. 3. Partial opacification involving the left mastoid air cells. Correlate for signs or symptoms of mastoiditis. Electronically Signed   By: Davina Poke D.O.   On: 12/22/2019 13:41   CT Cervical Spine Wo Contrast  Result Date: 12/22/2019 CLINICAL DATA:  Head trauma EXAM: CT CERVICAL SPINE WITHOUT CONTRAST TECHNIQUE: Multidetector CT imaging of the cervical spine was performed without intravenous contrast. Multiplanar CT image reconstructions were also generated. COMPARISON:  None. FINDINGS: Alignment: Normal alignment of the facet joints without dislocation. Dens and lateral  masses remain aligned. Preserved cervical lordosis without static listhesis. Skull base and vertebrae: No acute fracture. No primary bone lesion or focal pathologic process. Soft tissues and spinal canal: No prevertebral fluid or swelling. No visible canal hematoma. Disc levels: Intervertebral disc spaces are maintained. Mild multilevel facet arthrosis, most pronounced at C3-4 and C4-5 on the right and C4-5 on the left. Upper chest: Visualized lung apices clear. Other: 9 mm low-density right thyroid lobe nodule. No follow-up recommended (ref: J Am Coll Radiol. 2015 Feb;12(2): 143-50). IMPRESSION: 1. No acute fracture or static listhesis. 2. Mild cervical spondylosis. Electronically Signed   By: Davina Poke D.O.   On: 12/22/2019 13:46   DG CHEST PORT 1 VIEW  Result Date: 12/22/2019 CLINICAL DATA:  Altered mental status with fall and episode of unconsciousness. EXAM: PORTABLE CHEST 1 VIEW COMPARISON:  Radiographs 09/04/2019 and 12/20/2016. FINDINGS: The heart size and mediastinal contours are stable status post median sternotomy and aortic valve replacement. Left subclavian AICD leads appear unchanged. The lungs are clear. There is no pleural effusion or pneumothorax. No acute osseous findings. Telemetry leads overlie the chest. IMPRESSION: Stable chest.  No active cardiopulmonary process. Electronically Signed   By: Richardean Sale M.D.   On: 12/22/2019 16:33    EKG: Independently reviewed.  Atrial fibrillation at 61 bpm  Assessment/Plan Syncope: Acute.  Patient presents after having several episodes in which her eyes rolled back and backwards with upper extremity shaking.  Previous hospitalization in 08/2019 that was suspected secondary to orthostatic hypotension.  Case was discussed with neurology who felt seizure less likely and the patient could be having convulsive syncope due to orthostatic hypotension as patients with Parkinson's disease can suffer from autonomic dysfunction. -Admit to medical  telemetry bed -Neurochecks -Check orthostatic vital signs -Check TSH and CK -Check chest x-ray -Gentle IV fluids at 75 mL/h -PT to eval and treat  Suspected orthostatic hypotension, essential hypertension: Blood pressure noted to be as low as 86/69 by EMS.  Patient was given 500 mL bolus of IV fluids with improvement in blood pressure.  Home medications include furosemide 20 mg daily, olmesartan 20 mg daily, and Coreg 12.5 mg twice daily. -Held furosemide -Continue Coreg and pharmacy substitution for olmesartan -Adjust blood pressure medications as needed  Nonischemic cardiomyopathy s/p ICD: ICD was interrogated while in the ED and reported to be functioning properly.  Chronic diastolic congestive heart failure: Last EF noted  to be 55 - 60% with grade 1 diastolic dysfunction by echocardiogram in 08/2019.  Patient does not appear to be fluid overloaded at this time -Continue to monitor  Aortic stenosis s/p prosthetic valve replacement -Continue aspirin  Leukopenia: Acute.  WBC 3.7, but no clear signs of infection noted on urinalysis. -Follow-up chest x-ray -Recheck CBC in a.m.  Renal insufficiency: Creatinine 1.06 which appears near patient's baseline. -Continue to monitor  Chronic thrombocytopenia: Platelet count 140 which appears near baseline.  No reports of bleeding -Continue to monitor  Primary hyperparathyroidism -Continue outpatient management with endocrinology  Hyperlipidemia: Home medications include rosuvastatin -Continue rosuvastatin  Depression -Continue Zoloft  Mild dementia, Parkinson's disease: Patient with mild symptoms of dementia.  She follows in outpatient setting with Dr. Delice Lesch of neurology. -Continue donepezil and Sinemet  DVT prophylaxis: Lovenox Code Status: Full Family Communication: Son updated at bedside Disposition Plan: Likely discharge back to [facility] once medically stable Consults called:   Admission status: observation  Norval Morton MD [facility] Pager [phone]   If 7PM-7AM, please contact night-coverage [email or web address] Password [facility]  12/22/2019, 2:45 PM

## 2019-12-22 NOTE — ED Provider Notes (Signed)
[facility] EMERGENCY DEPARTMENT Provider Note   CSN: [id number] Arrival date & time:        History Chief Complaint  Patient presents with  . Loss of Consciousness  . Fall    [name] is a 64 y.o. female with a past medical history significant for vascular dementia, AICD placement, aortic stenosis, parkinson's disease, CHF, CAD, depression, hypertension, and hyperlipidemia who presents to the ED via EMS from [place] due to numerous syncopal episodes.  Per triage note, patient was ambulating at baseline with staff witnessed her eyes roll back and then she fell straight backwards. Patient was unconscious for a few seconds. She was found to be hypotensive at 89/67. Staff deny head injury. Patient denies headache, visual changes, nausea, and vomiting. Denies preceding headache or chest pain. No physical complaints during initial evaluation.  Spoke to [place] at [facility] who notes patient had numerous episodes when her eyes rolled back and had upper body shakes. Shelton Silvas noted it did not appear to be seizure-like activity. She is currently on ASA 81mg , but no other blood thinners. Patient was noted to have roughly 4-5 episodes that lasted a few seconds. Shelton Silvas noted that patient kept saying she felt tired after each episode. No history of seizures. Physician on call at the assisted living center evaluated patient and was worried about possible ICD malfunction.   Son is at bedside who notes that patient has had numerous of these episodes where her eyes roll back and she has rhythmic shaking in the past.   Level 5 caveat secondary to history of dementia.     Past Medical History:  Diagnosis Date  . Abnormal liver function tests   . AICD (automatic cardioverter/defibrillator) present    [employer]  . Anxiety   . Aortic stenosis   . Benign neoplasm of colon 04/19/2001   Hyperplastic  . Bicuspid aortic valve   . Cardiomyopathy secondary     . CHF (congestive heart failure) ([place])   . Coronary artery disease   . Depression   . Headache   . Heart murmur   . HTN (hypertension)   . Hypercalcemia   . Hyperlipidemia   . Memory changes 06/2018   in the last year /per the brother  . S/P ICD (internal cardiac defibrillator) procedure 2018    Patient Active Problem List   Diagnosis Date Noted  . Near syncope 09/04/2019  . AKI (acute kidney injury) ([place]) 09/04/2019  . Hypokalemia 09/04/2019  . Depression 09/04/2019  . Dementia ([place]) 09/04/2019  . Thrombocytopenia ([place]) 09/04/2019  . Low back pain 06/25/2019  . S/P AVR (aortic valve replacement) 12/12/2016  . Aortic stenosis 11/16/2016  . Severe aortic stenosis   . Polycythemia 06/06/2016  . Menopausal state 02/16/2016  . Hyperparathyroidism, primary ([place]) 02/16/2016  . Chronic systolic heart failure ([place]) 05/07/2014  . Hx of colonic polyps 06/17/2013  . Ventricular tachycardia ([place]) 02/21/2011  . Automatic implantable cardioverter-defibrillator in situ 06/02/2009  . Hyperlipidemia 05/29/2009  . SMOKER 05/29/2009  . Essential hypertension 05/29/2009  . Aortic valve disorder 05/29/2009  . CARDIOMYOPATHY, SECONDARY 05/29/2009  . CHF 05/29/2009  . BICUSPID AORTIC VALVE 05/29/2009  . LIVER FUNCTION TESTS, ABNORMAL, HX OF 05/29/2009  . CHOLECYSTITIS, HX OF 05/29/2009    Past Surgical History:  Procedure Laterality Date  . AORTIC VALVE REPLACEMENT N/A 11/16/2016   Procedure: AORTIC VALVE REPLACEMENT (AVR);  Surgeon: Gaye Pollack, MD;  Location: [facility];  Service: Open Heart Surgery;  Laterality: N/A;  16mm Perimount Magna Ease Pericardial Bioprosthesis  . BREAST EXCISIONAL BIOPSY Left 1984   benign  . CARDIAC CATHETERIZATION N/A 07/05/2016   Procedure: Right/Left Heart Cath and Coronary Angiography;  Surgeon: Burnell Blanks, MD;  Location: [facility];  Service: Cardiovascular;  Laterality: N/A;  . CHOLECYSTECTOMY    . implantation of a Guidant  biventricular ICD    . laparoscopic cholecystectomy with intraoperative cholangiogram  2003  . TEE WITHOUT CARDIOVERSION N/A 11/16/2016   Procedure: TRANSESOPHAGEAL ECHOCARDIOGRAM (TEE);  Surgeon: Gaye Pollack, MD;  Location: [facility];  Service: Open Heart Surgery;  Laterality: N/A;     OB History   No obstetric history on file.     Family History  Problem Relation Age of Onset  . Pancreatic cancer Mother   . Colon cancer Maternal Aunt   . Bone cancer Brother   . Heart attack Brother   . Hypercalcemia Neg Hx     Social History   Tobacco Use  . Smoking status: Current Every Day Smoker    Packs/day: 0.25    Years: 40.00    Pack years: 10.00    Types: Cigarettes  . Smokeless tobacco: Never Used  . Tobacco comment: client reports she smokes 1 cigarette per day  Substance Use Topics  . Alcohol use: Yes    Alcohol/week: 0.0 standard drinks    Comment: occasional  . Drug use: No    Home Medications Prior to Admission medications   Medication Sig Start Date End Date Taking? Authorizing Provider  acetaminophen (TYLENOL) 325 MG tablet Take 325 mg by mouth every 6 (six) hours as needed for mild pain.    [provider]  aspirin EC 81 MG tablet Take 1 tablet (81 mg total) daily by mouth. Patient taking differently: Take 81 mg by mouth daily as needed for moderate pain.  08/10/17   Burnell Blanks, MD  carbidopa-levodopa (SINEMET IR) 25-100 MG tablet Take 1 tablet twice a day at breakfast and dinner 02/28/19   Cameron Sprang, MD  carvedilol (COREG) 12.5 MG tablet Take 1 tablet by mouth 2 (two) times a day. With food 12/16/18   [provider]  donepezil (ARICEPT) 10 MG tablet Take 1/2 tablet daily for 2 weeks, then increase to 1 tablet daily Patient taking differently: Take 10 mg by mouth daily.  02/28/19   Cameron Sprang, MD  furosemide (LASIX) 20 MG tablet Take 1 tablet by mouth daily. 01/08/19   [provider]  Multiple Vitamin (MULTIVITAMIN WITH  MINERALS) TABS tablet Take 1 tablet by mouth at bedtime.    [provider]  olmesartan (BENICAR) 20 MG tablet Take 20 mg by mouth daily. 10/04/19   [provider]  Polyethyl Glycol-Propyl Glycol (SYSTANE OP) Place 1-2 drops into both eyes 3 (three) times daily as needed (for dry eyes.).    [provider]  polyethylene glycol (MIRALAX / GLYCOLAX) packet Take 17 g by mouth daily. 04/05/18   Domenic Moras, PA-C  rosuvastatin (CRESTOR) 10 MG tablet Take 1 tablet (10 mg total) by mouth daily. 05/01/11   Evans Lance, MD  sertraline (ZOLOFT) 100 MG tablet Take 1 tablet (100 mg total) by mouth daily. 02/28/19   Cameron Sprang, MD    Allergies    Trospium chloride er  Review of Systems   Review of Systems  Unable to perform ROS: Dementia  Neurological: Positive for syncope.    Physical Exam Updated Vital Signs BP  116/80 (BP Location: Right Arm)   Pulse 63   Temp 98.7 F (37.1 C) (Oral)   Resp 16   Ht 5\' 4"  (1.626 m)   Wt 58.1 kg   SpO2 100%   BMI 21.97 kg/m   Physical Exam Vitals and nursing note reviewed.  Constitutional:      General: She is not in acute distress.    Appearance: She is not toxic-appearing.  HENT:     Head: Normocephalic.  Eyes:     Extraocular Movements: Extraocular movements intact.     Pupils: Pupils are equal, round, and reactive to light.  Neck:     Comments: C-collar in place.  No cervical midline tenderness. Cardiovascular:     Rate and Rhythm: Normal rate and regular rhythm.     Pulses: Normal pulses.     Heart sounds: Normal heart sounds. No murmur. No friction rub. No gallop.   Pulmonary:     Effort: Pulmonary effort is normal.     Breath sounds: Normal breath sounds.  Abdominal:     General: Abdomen is flat. There is no distension.     Palpations: Abdomen is soft.     Tenderness: There is no abdominal tenderness. There is no guarding or rebound.  Musculoskeletal:     Cervical back: Neck supple.     Comments: No  T-spine and L-spine midline tenderness, no stepoff or deformity, no paraspinal tenderness No leg edema bilaterally Patient moves all extremities without difficulty. DP/PT pulses 2+ and equal bilaterally Sensation grossly intact bilaterally Strength of knee flexion and extension is 5/5 Plantar and dorsiflexion of ankle 5/5 Able to ambulate without difficulty   Neurological:     General: No focal deficit present.     Mental Status: She is alert.     Comments: Speech is clear, able to follow commands CN III-XII intact Normal strength in upper and lower extremities bilaterally including dorsiflexion and plantar flexion, strong and equal grip strength Sensation grossly intact throughout Moves extremities without ataxia, coordination intact No pronator drift   Psychiatric:        Mood and Affect: Mood normal.        Behavior: Behavior normal.     ED Results / Procedures / Treatments   Labs (all labs ordered are listed, but only abnormal results are displayed) Labs Reviewed  CBC WITH DIFFERENTIAL/PLATELET  BASIC METABOLIC PANEL  URINALYSIS, ROUTINE W REFLEX MICROSCOPIC  TROPONIN I (HIGH SENSITIVITY)    EKG None  Radiology No results found.  Procedures Procedures (including critical care time)  Medications Ordered in ED Medications - No data to display  ED Course  I have reviewed the triage vital signs and the nursing notes.  Pertinent labs & imaging results that were available during my care of the patient were reviewed by me and considered in my medical decision making (see chart for details).  Clinical Course as of Dec 22 1451  Mon Dec 22, 2019  1443 Spoke to Dr. Tamala Julian who agrees to admit patient for further evaluation and treatment.    [CA]    Clinical Course User Index [CA] Suzy Bouchard, PA-C   MDM Rules/Calculators/A&P                     64 year old female presents to the ED via EMS from [place] after numerous syncopal episodes that occurred  just prior to arrival.  Syncopal episodes consisted of eyes rolling to the back of her head with upper extremity  shaking.  No history of seizures.  History of dementia.  Initial vitals unremarkable.  Patient no acute distress and nontoxic-appearing.  Physical exam reassuring.  Normal neurological exam.  Will obtain routine labs to check for electrolyte abnormalities.  UA to rule out infection.  Troponin to rule out cardiac etiology.  Also obtain CT head/C-spine to rule out bony fractures.  CBC significant for leukopenia at 3.7, but otherwise reassuring.  UA negative for signs of infection. Reviewed ICD data, no shocks delivered. ICD functioning correctly. EKG personally reviewed which demonstrates A. fib with no signs of acute ischemia and no changes from previous EKG reading.  CT cervical spine personally reviewed which demonstrates: IMPRESSION:  1. No acute fracture or static listhesis.  2. Mild cervical spondylosis.   CT head personally reviewed which demonstrates: IMPRESSION:  1. No acute intracranial findings.  2. Chronic microvascular ischemic changes and cerebral volume loss,  similar to previous study.  3. Partial opacification involving the left mastoid air cells.  Correlate for signs or symptoms of mastoiditis.   Discussed case with Dr. Tyrone Nine who evaluated patient at bedside and agrees with assessment and plan. Given patient's numerous syncopal episodes will consult hospitalist for admission for further observation.   Spoke to Dr. Tamala Julian who agrees to admit patient for further evaluation for numerous syncopal episodes. COVID test pending.  Final Clinical Impression(s) / ED Diagnoses Final diagnoses:  None    Rx / DC Orders ED Discharge Orders    None       Suzy Bouchard, PA-C 12/22/19 [place], DO 12/22/19 1520

## 2019-12-22 NOTE — ED Triage Notes (Addendum)
Pt here from [facility], where she was ambulating at her baseline and staff witnessed her eyes roll back in her head, pt fell straight backwards, was unconscious for just a few seconds before regaining consciousness. Hypotensive- 89/67 initially. Staff sts she did not hit her head. Had another syncopal episode with EMS. Per staff patient had multiple syncopal episodes today before EMS was called. Pt has ICD. Pt's only complaint is feeling tired. Hx vascular dementia.

## 2019-12-23 DIAGNOSIS — I5032 Chronic diastolic (congestive) heart failure: Secondary | ICD-10-CM | POA: Diagnosis present

## 2019-12-23 DIAGNOSIS — I428 Other cardiomyopathies: Secondary | ICD-10-CM | POA: Diagnosis present

## 2019-12-23 DIAGNOSIS — I951 Orthostatic hypotension: Secondary | ICD-10-CM | POA: Diagnosis present

## 2019-12-23 DIAGNOSIS — I110 Hypertensive heart disease with heart failure: Secondary | ICD-10-CM | POA: Diagnosis present

## 2019-12-23 DIAGNOSIS — Z952 Presence of prosthetic heart valve: Secondary | ICD-10-CM | POA: Diagnosis not present

## 2019-12-23 DIAGNOSIS — R55 Syncope and collapse: Secondary | ICD-10-CM | POA: Diagnosis present

## 2019-12-23 DIAGNOSIS — Z79899 Other long term (current) drug therapy: Secondary | ICD-10-CM | POA: Diagnosis not present

## 2019-12-23 DIAGNOSIS — G20 Parkinson's disease: Secondary | ICD-10-CM | POA: Diagnosis present

## 2019-12-23 DIAGNOSIS — E872 Acidosis: Secondary | ICD-10-CM | POA: Diagnosis present

## 2019-12-23 DIAGNOSIS — Z953 Presence of xenogenic heart valve: Secondary | ICD-10-CM | POA: Diagnosis not present

## 2019-12-23 DIAGNOSIS — F0150 Vascular dementia without behavioral disturbance: Secondary | ICD-10-CM | POA: Diagnosis present

## 2019-12-23 DIAGNOSIS — F17210 Nicotine dependence, cigarettes, uncomplicated: Secondary | ICD-10-CM | POA: Diagnosis present

## 2019-12-23 DIAGNOSIS — N289 Disorder of kidney and ureter, unspecified: Secondary | ICD-10-CM | POA: Diagnosis present

## 2019-12-23 DIAGNOSIS — Z95810 Presence of automatic (implantable) cardiac defibrillator: Secondary | ICD-10-CM | POA: Diagnosis not present

## 2019-12-23 DIAGNOSIS — E861 Hypovolemia: Secondary | ICD-10-CM | POA: Diagnosis present

## 2019-12-23 DIAGNOSIS — Z888 Allergy status to other drugs, medicaments and biological substances status: Secondary | ICD-10-CM | POA: Diagnosis not present

## 2019-12-23 DIAGNOSIS — D649 Anemia, unspecified: Secondary | ICD-10-CM | POA: Diagnosis present

## 2019-12-23 DIAGNOSIS — E210 Primary hyperparathyroidism: Secondary | ICD-10-CM | POA: Diagnosis present

## 2019-12-23 DIAGNOSIS — F329 Major depressive disorder, single episode, unspecified: Secondary | ICD-10-CM | POA: Diagnosis present

## 2019-12-23 DIAGNOSIS — Z7982 Long term (current) use of aspirin: Secondary | ICD-10-CM | POA: Diagnosis not present

## 2019-12-23 DIAGNOSIS — Z8249 Family history of ischemic heart disease and other diseases of the circulatory system: Secondary | ICD-10-CM | POA: Diagnosis not present

## 2019-12-23 DIAGNOSIS — D696 Thrombocytopenia, unspecified: Secondary | ICD-10-CM | POA: Diagnosis present

## 2019-12-23 DIAGNOSIS — F0280 Dementia in other diseases classified elsewhere without behavioral disturbance: Secondary | ICD-10-CM | POA: Diagnosis present

## 2019-12-23 DIAGNOSIS — Z20822 Contact with and (suspected) exposure to covid-19: Secondary | ICD-10-CM | POA: Diagnosis present

## 2019-12-23 DIAGNOSIS — E785 Hyperlipidemia, unspecified: Secondary | ICD-10-CM | POA: Diagnosis present

## 2019-12-23 DIAGNOSIS — I2510 Atherosclerotic heart disease of native coronary artery without angina pectoris: Secondary | ICD-10-CM | POA: Diagnosis present

## 2019-12-23 LAB — BASIC METABOLIC PANEL
Anion gap: 11 (ref 5–15)
BUN: 18 mg/dL (ref 8–23)
CO2: 23 mmol/L (ref 22–32)
Calcium: 10.4 mg/dL — ABNORMAL HIGH (ref 8.9–10.3)
Chloride: 105 mmol/L (ref 98–111)
Creatinine, Ser: 0.84 mg/dL (ref 0.44–1.00)
GFR calc Af Amer: >60 mL/min (ref >60)
GFR calc non Af Amer: >60 mL/min (ref >60)
Glucose, Bld: 94 mg/dL (ref 70–99)
Potassium: 4.2 mmol/L (ref 3.5–5.1)
Sodium: 139 mmol/L (ref 135–145)

## 2019-12-23 LAB — CBC
HCT: 46.9 % — ABNORMAL HIGH (ref 36.0–46.0)
Hemoglobin: 14.9 g/dL (ref 12.0–15.0)
MCH: 28.5 pg (ref 26.0–34.0)
MCHC: 31.8 g/dL (ref 30.0–36.0)
MCV: 89.8 fL (ref 80.0–100.0)
Platelets: 159 10*3/uL (ref 150–400)
RBC: 5.22 MIL/uL — ABNORMAL HIGH (ref 3.87–5.11)
RDW: 15.8 % — ABNORMAL HIGH (ref 11.5–15.5)
WBC: 4 10*3/uL (ref 4.0–10.5)
nRBC: 0 % (ref 0.0–0.2)

## 2019-12-23 MED ORDER — IRBESARTAN 150 MG PO TABS
150.0000 mg | ORAL_TABLET | Freq: Once | ORAL | Status: AC
  Administered 2019-12-23: 150 mg via ORAL
  Filled 2019-12-23: qty 1

## 2019-12-23 MED ORDER — IRBESARTAN 300 MG PO TABS
300.0000 mg | ORAL_TABLET | Freq: Every day | ORAL | Status: DC
  Administered 2019-12-24: 300 mg via ORAL
  Filled 2019-12-23: qty 1

## 2019-12-23 MED ORDER — POLYVINYL ALCOHOL 1.4 % OP SOLN
1.0000 [drp] | OPHTHALMIC | Status: DC | PRN
  Filled 2019-12-23: qty 15

## 2019-12-23 NOTE — Plan of Care (Signed)

## 2019-12-23 NOTE — ED Notes (Signed)
Tele   Breakfast ordered  

## 2019-12-23 NOTE — ED Notes (Signed)
Breakfast tray set up at bedside  

## 2019-12-23 NOTE — Evaluation (Signed)
Physical Therapy Evaluation Patient Details Name: [name] MRN: [id number] DOB: 1955/11/12 Today's Date: 12/23/2019   History of Present Illness  Pt is a 64 y/o female admitted from ALF secondary to syncopal episode; likely orthostatic hypotension. PMH includes dementia, parkinson's disease, HTN, dCHF, CAD, s/p ICD, and s/p AVR.   Clinical Impression  Pt admitted secondary to problem above with deficits below. Pt requiring min A to stand and transfer to chair. Pt with 1 LOB requiring assist to prevent fall. Unsure of accuracy of PLOF information, as pt reporting conflicting information. Feel if ALF is able to provide necessary support, would benefit from return to ALF with PT services. If ALF unable to provide necessary support, pt will likely require SNF level therapies. Will continue to follow acutely to maximize functional mobility independence and safety.     Follow Up Recommendations Home health PT;SNF;Supervision/Assistance - 24 hour(Return to ALF vs SNF )    Equipment Recommendations  Wheelchair (measurements PT);Wheelchair cushion (measurements PT)    Recommendations for Other Services       Precautions / Restrictions Precautions Precautions: Fall Restrictions Weight Bearing Restrictions: No      Mobility  Bed Mobility Overal bed mobility: Needs Assistance Bed Mobility: Supine to Sit     Supine to sit: Min guard     General bed mobility comments: Min guard for safety.   Transfers Overall transfer level: Needs assistance Equipment used: 1 person hand held assist Transfers: Sit to/from [facility] Sit to Stand: Min assist Stand pivot transfers: Min assist       General transfer comment: Min A for lift assist and steadying to stand and pivot to chair. 1 LOB noted throughout transfer, requiring assist to prevent fall. PT stood in front of pt and had pt hold to PT arms.   Ambulation/Gait                Stairs             Wheelchair Mobility    Modified Rankin (Stroke Patients Only)       Balance Overall balance assessment: Needs assistance Sitting-balance support: No upper extremity supported;Feet supported Sitting balance-Leahy Scale: Good     Standing balance support: Bilateral upper extremity supported;During functional activity Standing balance-Leahy Scale: Poor Standing balance comment: Reliant on BUE and external support                              Pertinent Vitals/Pain Pain Assessment: No/denies pain    Home Living Family/patient expects to be discharged to:: Assisted living               Home Equipment: Walker - 2 wheels Additional Comments: Unsure of all equipment at ALF as pt confused.     Prior Function Level of Independence: Needs assistance   Gait / Transfers Assistance Needed: Reports staff helps her walk, sometimes requires walker   ADL's / Homemaking Assistance Needed: Staff has to assist with bathing/dressing        Hand Dominance   Dominant Hand: Right    Extremity/Trunk Assessment   Upper Extremity Assessment Upper Extremity Assessment: Generalized weakness    Lower Extremity Assessment Lower Extremity Assessment: Generalized weakness    Cervical / Trunk Assessment Cervical / Trunk Assessment: Normal  Communication   Communication: No difficulties  Cognition Arousal/Alertness: Awake/alert Behavior During Therapy: WFL for tasks assessed/performed Overall Cognitive Status: History of cognitive impairments - at baseline  General Comments: Dementia at baseline. Pt pleasantly confused and reporting conflicting information during session.       General Comments General comments (skin integrity, edema, etc.): No family present     Exercises     Assessment/Plan    PT Assessment Patient needs continued PT services  PT Problem List Decreased strength;Decreased balance;Decreased  mobility;Decreased cognition;Decreased knowledge of use of DME;Decreased safety awareness;Decreased knowledge of precautions       PT Treatment Interventions Gait training;Functional mobility training;Therapeutic activities;DME instruction;Therapeutic exercise;Balance training;Patient/family education;Cognitive remediation    PT Goals (Current goals can be found in the Care Plan section)  Acute Rehab PT Goals Patient Stated Goal: to get out of bed PT Goal Formulation: With patient Time For Goal Achievement: 01/06/20 Potential to Achieve Goals: Good    Frequency Min 3X/week   Barriers to discharge        Co-evaluation               AM-PAC PT "6 Clicks" Mobility  Outcome Measure Help needed turning from your back to your side while in a flat bed without using bedrails?: None Help needed moving from lying on your back to sitting on the side of a flat bed without using bedrails?: A Little Help needed moving to and from a bed to a chair (including a wheelchair)?: A Little Help needed standing up from a chair using your arms (e.g., wheelchair or bedside chair)?: A Little Help needed to walk in hospital room?: A Little Help needed climbing 3-5 steps with a railing? : A Lot 6 Click Score: 18    End of Session Equipment Utilized During Treatment: Gait belt Activity Tolerance: Patient tolerated treatment well Patient left: in chair;with call bell/phone within reach;with chair alarm set Nurse Communication: Mobility status PT Visit Diagnosis: Unsteadiness on feet (R26.81);History of falling (Z91.81);Muscle weakness (generalized) (M62.81)    Time: 5597-4163 PT Time Calculation (min) (ACUTE ONLY): 18 min   Charges:   PT Evaluation $PT Eval Low Complexity: 1 Low          Lou Miner, DPT  Acute Rehabilitation Services  Pager: [phone] Office: [phone]   Rudean Hitt 12/23/2019, 11:28 AM

## 2019-12-23 NOTE — Progress Notes (Signed)
Progress Note    [name]  FHL:[id number] DOB: Oct 05, 1955  DOA: 12/22/2019 PCP: Lujean Amel, MD    Brief Narrative:   Chief complaint: syncope  Medical records reviewed and are as summarized below:  [name] is an 64 y.o. female with a past medical history significant for hypertension, hyperlipidemia, aortic stenosis status post bioprosthetic AVR, nonischemic cardiomyopathy status post AICD, chronic diastolic heart failure, CAD, Parkinson's and mild dementia presented to the emergency department from a facility after having several episodes of syncope.  Patient son and chart review indicate over the last several months these episodes have been occurring once or twice a week.  [name] reports she is usually back to her baseline orally after episodes.  No associated symptoms except fatigue.  She was evaluated December 2020 similar symptoms were thought to be secondary to orthostatic hypotension.  MS reported that and wrote yesterday she had another episode.  Assessment/Plan:   Principal Problem:   Syncopal episodes Active Problems:   Dementia due to Parkinson's disease without behavioral disturbance (HCC)   Orthostatic hypotension   S/P AVR (aortic valve replacement)   Dementia (HCC)   Renal insufficiency   Hyperlipidemia   Hyperparathyroidism, primary ([place])   Thrombocytopenia ([place])  #1.  Syncope.  Etiology unclear.  Reportedly during these episodes her eyes rolled back there is some shaking of her upper extremities.  No signs of infection no metabolic derangement.  TSH and CK within the limits of normal. CT of the head doubt acute abnormality.  He received 500 cc normal saline and continuous IV fluids at 75 cc an hour overnight.  Previous work-up opined episodes likely related to orthostatic hypotension.  This admission Case discussed with neurology who felt seizures were less likely and the patient could be having convulsive syncope due to orthostatic hypotension  as patients with Parkinson's disease suffer from autonomic dysfunction.  This morning her blood pressures been on the high end of normal.  Orthostatic yesterday and this am SBP dropped from 186 to 164 when standing. -continue to hold lasix -TED hose -increase avapro -stop IV fluid -monitor  #2.  Orthostatic hypotension/hypertension.  Home medications include Lasix, Coreg, olmesartan.  Patient was provided with 500 cc bolus of normal saline and continuous IV fluids during the night.  This morning her blood pressure is elevated.  She has received home Coreg and ARB.  Lasix is on hold. -Will increase avapro -stop iv fluids -TED hose -monitor  #3.  Nonischemic cardiomyopathy status post ICD.  Chart review indicates ICD was interrogated while in the emergency department yesterday and reported to be functioning properly. -daily weight -intake and output -Home medications as noted above  #4.  Chronic diastolic heart failure.  Echocardiogram done in December 2020 revealed an EF of 55% with grade 1 diastolic dysfunction.  Appears compensated.  She received IV fluids yesterday as noted above. -Continue to hold home Lasix -Monitor intake and output -Obtain daily weights -Resume Lasix as indicated in the setting of #2  5.  Aortic stenosis status post prosthetic valve replacement.  Home medications include aspirin -Continue aspirin  #6.  Anemia.  On presentation WBC is 3.7.  Today low end of normal.  #7.  Renal insufficiency.  Creatinine 1.06 on admission.  Today within the limits of normal. -Monitor -Hold nephrotoxins as able -Intake and output  #8.  Hyperlipidemia. -Continue statin  #9.  Depression.  Appears stable at baseline -Continue home meds  #10.  Dementia/Parkinson's.  Appears mild.  Patient is oriented to self and place and recalls the reasons for her admission. -continue with outpatient follow-up with neurology -Continue home meds    Family Communication/Anticipated D/C  date and plan/Code Status   DVT prophylaxis: Lovenox ordered. Code Status: Full Code.  Family Communication: son on phone Disposition Plan: back to facility   Medical Consultants:    None.   Anti-Infectives:    None  Subjective:   Awake alert.  Denies pain or discomfort.  Objective:    Vitals:   12/23/19 0859 12/23/19 1000 12/23/19 1025 12/23/19 1029  BP: (!) 181/110 (!) 179/115  (!) 184/111  Pulse: 60 62 61 65  Resp: 12 13 (!) 22 16  Temp:      TempSrc:      SpO2: 95% 100% 100% 100%  Weight:      Height:        Intake/Output Summary (Last 24 hours) at 12/23/2019 1029 Last data filed at 12/22/2019 1523 Gross per 24 hour  Intake 500 ml  Output --  Net 500 ml   Filed Weights   12/22/19 1207  Weight: 58.1 kg    Exam: Neuro: Awake alert well-nourished no acute distress CV: Regular rate and rhythm no murmur gallop or rub no lower extremity edema Respiratory: No increased work of breathing breath sounds are distant but clear bilaterally I hear no wheezes no crackles Abdomen nondistended soft positive bowel sounds throughout nontender to palpation no guarding or rebounding Musculoskeletal: Joints without swelling/erythema full range of motion Neuro: Alert and oriented to self and place speech clear facial symmetry bilateral grip 5 out of 5 cranial nerves II through XII grossly intact  Data Reviewed:   I have personally reviewed following labs and imaging studies:  Labs: Labs show the following:   Basic Metabolic Panel: Recent Labs  Lab 12/22/19 1220 12/23/19 0339  NA 137 139  K 4.9 4.2  CL 106 105  CO2 21* 23  GLUCOSE 77 94  BUN 21 18  CREATININE 1.06* 0.84  CALCIUM 9.7 10.4*   GFR Estimated Creatinine Clearance: 57.7 mL/min (by C-G formula based on SCr of 0.84 mg/dL). Liver Function Tests: No results for input(s): AST, ALT, ALKPHOS, BILITOT, PROT, ALBUMIN in the last 168 hours. No results for input(s): LIPASE, AMYLASE in the last 168  hours. No results for input(s): AMMONIA in the last 168 hours. Coagulation profile No results for input(s): INR, PROTIME in the last 168 hours.  CBC: Recent Labs  Lab 12/22/19 1220 12/23/19 0339  WBC 3.7* 4.0  NEUTROABS 1.4*  --   HGB 14.1 14.9  HCT 44.7 46.9*  MCV 90.5 89.8  PLT 140* 159   Cardiac Enzymes: Recent Labs  Lab 12/22/19 1519  CKTOTAL 138   BNP (last 3 results) No results for input(s): PROBNP in the last 8760 hours. CBG: Recent Labs  Lab 12/22/19 1658  GLUCAP 93   D-Dimer: No results for input(s): DDIMER in the last 72 hours. Hgb A1c: No results for input(s): HGBA1C in the last 72 hours. Lipid Profile: No results for input(s): CHOL, HDL, LDLCALC, TRIG, CHOLHDL, LDLDIRECT in the last 72 hours. Thyroid function studies: Recent Labs    12/22/19 1519  TSH 2.156   Anemia work up: No results for input(s): VITAMINB12, FOLATE, FERRITIN, TIBC, IRON, RETICCTPCT in the last 72 hours. Sepsis Labs: Recent Labs  Lab 12/22/19 1220 12/23/19 0339  WBC 3.7* 4.0    Microbiology Recent Results (from the past 240 hour(s))  SARS CORONAVIRUS 2 (TAT 6-24  HRS) Nasopharyngeal Nasopharyngeal Swab     Status: None   Collection Time: 12/22/19  2:54 PM   Specimen: Nasopharyngeal Swab  Result Value Ref Range Status   SARS Coronavirus 2 NEGATIVE NEGATIVE Final    Comment: (NOTE) SARS-CoV-2 target nucleic acids are NOT DETECTED. The SARS-CoV-2 RNA is generally detectable in upper and lower respiratory specimens during the acute phase of infection. Negative results do not preclude SARS-CoV-2 infection, do not rule out co-infections with other pathogens, and should not be used as the sole basis for treatment or other patient management decisions. Negative results must be combined with clinical observations, patient history, and epidemiological information. The expected result is Negative. Fact Sheet for Patients: SugarRoll.be Fact Sheet  for Healthcare Providers: https://www.woods-mathews.com/ This test is not yet approved or cleared by the [place] FDA and  has been authorized for detection and/or diagnosis of SARS-CoV-2 by FDA under an Emergency Use Authorization (EUA). This EUA will remain  in effect (meaning this test can be used) for the duration of the COVID-19 declaration under Section 56 4(b)(1) of the Act, 21 U.S.C. section 360bbb-3(b)(1), unless the authorization is terminated or revoked sooner. Performed at [facility], [place] [address] [place], [place] [address]     Procedures and diagnostic studies:  CT Head Wo Contrast  Result Date: 12/22/2019 CLINICAL DATA:  Headache EXAM: CT HEAD WITHOUT CONTRAST TECHNIQUE: Contiguous axial images were obtained from the base of the skull through the vertex without intravenous contrast. COMPARISON:  06/13/2018 FINDINGS: Brain: No evidence of acute infarction, hemorrhage, hydrocephalus, extra-axial collection or mass lesion/mass effect. Remote left basal ganglia lacunar infarct. Extensive low-density changes within the periventricular and subcortical white matter compatible with chronic microvascular ischemic change. Mild-moderate diffuse cerebral volume loss. Vascular: Atherosclerotic calcifications involving the large vessels of the skull base. No unexpected hyperdense vessel. Skull: Normal. Negative for fracture or focal lesion. Sinuses/Orbits: Partial opacification involving the left mastoid air cells. Right mastoid air cells are clear. Paranasal sinuses are clear. Orbital structures unremarkable. Other: None. IMPRESSION: 1. No acute intracranial findings. 2. Chronic microvascular ischemic changes and cerebral volume loss, similar to previous study. 3. Partial opacification involving the left mastoid air cells. Correlate for signs or symptoms of mastoiditis. Electronically Signed   By: Davina Poke D.O.   On: 12/22/2019 13:41   CT Cervical Spine Wo  Contrast  Result Date: 12/22/2019 CLINICAL DATA:  Head trauma EXAM: CT CERVICAL SPINE WITHOUT CONTRAST TECHNIQUE: Multidetector CT imaging of the cervical spine was performed without intravenous contrast. Multiplanar CT image reconstructions were also generated. COMPARISON:  None. FINDINGS: Alignment: Normal alignment of the facet joints without dislocation. Dens and lateral masses remain aligned. Preserved cervical lordosis without static listhesis. Skull base and vertebrae: No acute fracture. No primary bone lesion or focal pathologic process. Soft tissues and spinal canal: No prevertebral fluid or swelling. No visible canal hematoma. Disc levels: Intervertebral disc spaces are maintained. Mild multilevel facet arthrosis, most pronounced at C3-4 and C4-5 on the right and C4-5 on the left. Upper chest: Visualized lung apices clear. Other: 9 mm low-density right thyroid lobe nodule. No follow-up recommended (ref: J Am Coll Radiol. 2015 Feb;12(2): 143-50). IMPRESSION: 1. No acute fracture or static listhesis. 2. Mild cervical spondylosis. Electronically Signed   By: Davina Poke D.O.   On: 12/22/2019 13:46   DG CHEST PORT 1 VIEW  Result Date: 12/22/2019 CLINICAL DATA:  Altered mental status with fall and episode of unconsciousness. EXAM: PORTABLE CHEST 1 VIEW COMPARISON:  Radiographs 09/04/2019 and 12/20/2016. FINDINGS: The heart size and mediastinal contours are stable status post median sternotomy and aortic valve replacement. Left subclavian AICD leads appear unchanged. The lungs are clear. There is no pleural effusion or pneumothorax. No acute osseous findings. Telemetry leads overlie the chest. IMPRESSION: Stable chest.  No active cardiopulmonary process. Electronically Signed   By: Richardean Sale M.D.   On: 12/22/2019 16:33    Medications:    aspirin EC  81 mg Oral Daily   carbidopa-levodopa  1 tablet Oral BID   carvedilol  12.5 mg Oral BID WC   donepezil  10 mg Oral Daily   enoxaparin  (LOVENOX) injection  40 mg Subcutaneous Q24H   irbesartan  150 mg Oral Daily   polyethylene glycol  17 g Oral Daily   rosuvastatin  10 mg Oral Daily   sertraline  100 mg Oral Daily   sodium chloride flush  3 mL Intravenous Q12H   Continuous Infusions:  sodium chloride       LOS: 0 days   Radene Gunning NP [facility]   How to contact the [facility] Attending or Consulting provider [place] or covering provider during after hours [place], for this patient?  1. Check the care team in [facility] and look for a) attending/consulting TRH provider listed and b) the [facility] team listed 2. Log into [email or web address] and use [place]'s universal password to access. If you do not have the password, please contact the hospital operator. 3. Locate the [facility] provider you are looking for under [facility] and page to a number that you can be directly reached. 4. If you still have difficulty reaching the provider, please page the [facility] (Director on Call) for the Hospitalists listed on amion for assistance.  12/23/2019, 10:29 AM

## 2019-12-24 ENCOUNTER — Other Ambulatory Visit: Payer: Self-pay

## 2019-12-24 LAB — BASIC METABOLIC PANEL
Anion gap: 11 (ref 5–15)
BUN: 19 mg/dL (ref 8–23)
CO2: 18 mmol/L — ABNORMAL LOW (ref 22–32)
Calcium: 10 mg/dL (ref 8.9–10.3)
Chloride: 105 mmol/L (ref 98–111)
Creatinine, Ser: 0.89 mg/dL (ref 0.44–1.00)
GFR calc Af Amer: >60 mL/min (ref >60)
GFR calc non Af Amer: >60 mL/min (ref >60)
Glucose, Bld: 86 mg/dL (ref 70–99)
Potassium: 4.6 mmol/L (ref 3.5–5.1)
Sodium: 134 mmol/L — ABNORMAL LOW (ref 135–145)

## 2019-12-24 LAB — CBC
HCT: 47.5 % — ABNORMAL HIGH (ref 36.0–46.0)
Hemoglobin: 15.2 g/dL — ABNORMAL HIGH (ref 12.0–15.0)
MCH: 28.4 pg (ref 26.0–34.0)
MCHC: 32 g/dL (ref 30.0–36.0)
MCV: 88.8 fL (ref 80.0–100.0)
Platelets: 145 10*3/uL — ABNORMAL LOW (ref 150–400)
RBC: 5.35 MIL/uL — ABNORMAL HIGH (ref 3.87–5.11)
RDW: 15.7 % — ABNORMAL HIGH (ref 11.5–15.5)
WBC: 4.3 10*3/uL (ref 4.0–10.5)
nRBC: 0 % (ref 0.0–0.2)

## 2019-12-24 NOTE — NC FL2 (Signed)
[place] LEVEL OF CARE SCREENING TOOL     IDENTIFICATION  Patient Name: [name] Birthdate: 28-Dec-1955 Sex: female Admission Date (Current Location): 12/22/2019  [facility] and [place] Number:  Herbalist and Address:  [facility] [place], [place] [address] [place], [place] [address]      Provider Number: [id number]  Attending Physician Name and Address:  Alma Friendly, MD  Relative Name and Phone Number:  [name] [phone]    Current Level of Care: Hospital Recommended Level of Care: [place] Prior Approval Number:    Date Approved/Denied:   PASRR Number:    Discharge Plan: Other (Comment)(assisted living)    Current Diagnoses: Patient Active Problem List   Diagnosis Date Noted  . Orthostatic hypotension 12/23/2019  . Syncopal episodes 12/22/2019  . 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 Renal insufficiency 12/22/2019  . Near syncope 09/04/2019  . AKI (acute kidney injury) ([place]) 09/04/2019  . Hypokalemia 09/04/2019  . Depression 09/04/2019  . Dementia ([place]) 09/04/2019  . Thrombocytopenia ([place]) 09/04/2019  . Low back pain 06/25/2019  . S/P AVR (aortic valve replacement) 12/12/2016  . Aortic stenosis 11/16/2016  . Severe aortic stenosis   . Polycythemia 06/06/2016  . Menopausal state 02/16/2016  . Hyperparathyroidism, primary ([place]) 02/16/2016  . Chronic systolic heart failure ([place]) 05/07/2014  . Hx of colonic polyps 06/17/2013  . Ventricular tachycardia ([place]) 02/21/2011  . Automatic implantable cardioverter-defibrillator in situ 06/02/2009  . Hyperlipidemia 05/29/2009  . SMOKER 05/29/2009  . Essential hypertension 05/29/2009  . Aortic valve disorder 05/29/2009  . CARDIOMYOPATHY, SECONDARY 05/29/2009  . CHF 05/29/2009  . BICUSPID AORTIC VALVE 05/29/2009  . LIVER FUNCTION TESTS, ABNORMAL, HX OF 05/29/2009  . CHOLECYSTITIS, HX OF  05/29/2009    Orientation RESPIRATION BLADDER Height & Weight     Self, Time  Normal Incontinent, External catheter Weight: 128 lb (58.1 kg) Height:  5\' 4"  (162.6 cm)  BEHAVIORAL SYMPTOMS/MOOD NEUROLOGICAL BOWEL NUTRITION STATUS      Incontinent Diet(see discharge summary)  AMBULATORY STATUS COMMUNICATION OF NEEDS Skin   Supervision Verbally Normal                       Personal Care Assistance Level of Assistance  Bathing, Feeding, Dressing Bathing Assistance: Limited assistance Feeding assistance: Limited assistance Dressing Assistance: Limited assistance     Functional Limitations Info             SPECIAL CARE FACTORS FREQUENCY                       Contractures      Additional Factors Info  Allergies   Allergies Info: Trospium Chloride Er           Current Medications (12/24/2019):  This is the current hospital active medication list Current Facility-Administered Medications  Medication Dose Route Frequency Provider Last Rate Last Admin  . acetaminophen (TYLENOL) tablet 650 mg  650 mg Oral Q6H PRN Fuller Plan A, MD       Or  . acetaminophen (TYLENOL) suppository 650 mg  650 mg Rectal Q6H PRN Smith, Rondell A, MD      . albuterol (PROVENTIL) (2.5 MG/3ML) 0.083% nebulizer solution 2.5 mg  2.5 mg Nebulization Q6H PRN Fuller Plan A, MD      . aspirin EC tablet 81 mg  81 mg Oral Daily Norval Morton, MD   81  mg at 12/24/19 1139  . carbidopa-levodopa (SINEMET IR) 25-100 MG per tablet immediate release 1 tablet  1 tablet Oral BID Fuller Plan A, MD   1 tablet at 12/24/19 1137  . carvedilol (COREG) tablet 12.5 mg  12.5 mg Oral BID WC Smith, Rondell A, MD   12.5 mg at 12/24/19 1138  . donepezil (ARICEPT) tablet 10 mg  10 mg Oral Daily Tamala Julian, Rondell A, MD   10 mg at 12/24/19 1140  . enoxaparin (LOVENOX) injection 40 mg  40 mg Subcutaneous Q24H Fuller Plan A, MD   40 mg at 12/22/19 2154  . irbesartan (AVAPRO) tablet 300 mg  300 mg Oral Daily  Radene Gunning, NP   300 mg at 12/24/19 1134  . polyethylene glycol (MIRALAX / GLYCOLAX) packet 17 g  17 g Oral Daily Tamala Julian, Rondell A, MD   17 g at 12/24/19 1134  . polyvinyl alcohol (LIQUIFILM TEARS) 1.4 % ophthalmic solution 1 drop  1 drop Both Eyes PRN Smith, Rondell A, MD      . rosuvastatin (CRESTOR) tablet 10 mg  10 mg Oral Daily Tamala Julian, Rondell A, MD   10 mg at 12/24/19 1141  . sertraline (ZOLOFT) tablet 100 mg  100 mg Oral Daily Smith, Rondell A, MD   100 mg at 12/24/19 1140  . sodium chloride flush (NS) 0.9 % injection 3 mL  3 mL Intravenous Q12H Smith, Rondell A, MD   3 mL at 12/24/19 1143     Discharge Medications: Please see discharge summary for a list of discharge medications.  Relevant Imaging Results:  Relevant Lab Results:   Additional Information SSN [place], [place]

## 2019-12-24 NOTE — Progress Notes (Signed)
Physical Therapy Treatment Patient Details Name: [name] MRN: [id number] DOB: 1956-07-25 Today's Date: 12/24/2019    History of Present Illness Pt is a 64 y/o female admitted from ALF secondary to syncopal episode; likely orthostatic hypotension. PMH includes dementia, parkinson's disease, HTN, dCHF, CAD, s/p ICD, and s/p AVR.     PT Comments    Patient received sitting in recliner, two NT in room state that patient was up walking in room with alarm going off when they arrived. Patient confused. Not able to provide accurate history. Patient requires min guard for sit to stand transfers and ambulated 25 feet in room with min guard/hand held assist. Cues needed for safety with mobility, obstacle avoidance. She will continue to benefit from skilled PT while here to improve independence with mobility.    Follow Up Recommendations  SNF- if she lives at ALF she can return if staff able to assist.     Equipment Recommendations       Recommendations for Other Services       Precautions / Restrictions Precautions Precautions: Fall Restrictions Weight Bearing Restrictions: No    Mobility  Bed Mobility               General bed mobility comments: patient received in recliner  Transfers Overall transfer level: Needs assistance Equipment used: None Transfers: Sit to/from Stand Sit to Stand: Min guard            Ambulation/Gait Ambulation/Gait assistance: Min guard Gait Distance (Feet): 25 Feet Assistive device: 1 person hand held assist Gait Pattern/deviations: Step-to pattern;Shuffle;Decreased step length - right;Decreased step length - left Gait velocity: decr   General Gait Details: patient takes very small, shuffle steps, confusion limits safety awareness. Cues needed throughout session.   Stairs             Wheelchair Mobility    Modified Rankin (Stroke Patients Only)       Balance Overall balance assessment: Needs  assistance Sitting-balance support: Feet supported Sitting balance-Leahy Scale: Good     Standing balance support: Single extremity supported;During functional activity Standing balance-Leahy Scale: Fair Standing balance comment: reliant on at least single UE support for balance and safety with mobility.                            Cognition Arousal/Alertness: Awake/alert Behavior During Therapy: WFL for tasks assessed/performed Overall Cognitive Status: No family/caregiver present to determine baseline cognitive functioning                                 General Comments: dementia at baseline. Patient up without assist prior to my arrival.      Exercises Other Exercises Other Exercises: seated LAQ, hip abd/add x 10 reps each    General Comments        Pertinent Vitals/Pain Pain Assessment: No/denies pain    Home Living                      Prior Function            PT Goals (current goals can now be found in the care plan section) Acute Rehab PT Goals Patient Stated Goal: to walk PT Goal Formulation: With patient Time For Goal Achievement: 01/06/20 Potential to Achieve Goals: Good Progress towards PT goals: Progressing toward goals    Frequency    Min 3X/week  PT Plan Current plan remains appropriate    Co-evaluation              AM-PAC PT "6 Clicks" Mobility   Outcome Measure  Help needed turning from your back to your side while in a flat bed without using bedrails?: A Little Help needed moving from lying on your back to sitting on the side of a flat bed without using bedrails?: A Little Help needed moving to and from a bed to a chair (including a wheelchair)?: A Little Help needed standing up from a chair using your arms (e.g., wheelchair or bedside chair)?: A Little Help needed to walk in hospital room?: A Little Help needed climbing 3-5 steps with a railing? : A Lot 6 Click Score: 17    End of  Session Equipment Utilized During Treatment: Gait belt Activity Tolerance: Patient tolerated treatment well;Patient limited by fatigue Patient left: in chair;with call bell/phone within reach;with chair alarm set Nurse Communication: Mobility status PT Visit Diagnosis: Unsteadiness on feet (R26.81);History of falling (Z91.81);Muscle weakness (generalized) (M62.81);Difficulty in walking, not elsewhere classified (R26.2)     Time: 0076-2263 PT Time Calculation (min) (ACUTE ONLY): 20 min  Charges:  $Gait Training: 8-22 mins                     Jabron Weese, PT, GCS 12/24/19,2:22 PM

## 2019-12-24 NOTE — Plan of Care (Signed)

## 2019-12-24 NOTE — Clinical Social Work Note (Signed)
CSW spoke with [place], [place] at [facility] [phone] to inquire about patient returning when medically ready.   Clincial information has been sent for review to [phone].   Plan is to return back to [facility] once medically ready pending their approval.

## 2019-12-24 NOTE — Patient Outreach (Signed)
  [facility]) Care Management Chronic Special Needs Program    12/24/2019  Name: [name], DOB: 1956/06/04  MRN: [id number]   [name] is enrolled in a chronic special needs plan for Heart Failure. RNCM received notification that client admitted to [facility] on 12/23/19 with syncope and collapse. Individualized care plan sent to [facility] utilization management department.   PLAN; RNCM will notify [facility] care management hospital liaison of client's admission and request follow up on client's discharge disposition.  RNCM will continue to follow and assist as needed.   [name] RN,BSN,CCM [facility] Care Management [phone]

## 2019-12-24 NOTE — Consult Note (Signed)
   [facility] CM Inpatient Consult   12/24/2019  DOMIQUE REARDON 17-Dec-1955 [id number]    Alert by RN Chronic Care Coordinator for HTA CSNP:  Patient is currently active with [facility] Care Management for chronic disease management services in the [place].  Patient has been engaged by a RN Chronic Care Coordinator for  plan of care has focused on HF disease management and community resource support.    Patient admitted with syncope.  Patient is from [facility] memory care.  Plan: Will follow up with Inpatient Transition Of Care [TOC] team member to make aware that [place] [facility] following. Following patient for progress and disposition.  Of note, [facility] Care Management services does not replace or interfere with any services that are needed or arranged by inpatient [facility] care management team.  For additional questions or referrals please contact:  Natividad Brood, RN BSN [facility] Liaison  [phone] business mobile phone Toll free office [phone]  Fax number: [phone] [email or web address]

## 2019-12-24 NOTE — Progress Notes (Signed)
Progress Note    [name]  WUJ:[id number] DOB: July 20, 1956  DOA: 12/22/2019 PCP: Lujean Amel, MD    Brief Narrative:   Chief complaint: syncope  Medical records reviewed and are as summarized below:  [name] is an 64 y.o. female with a past medical history significant for hypertension, hyperlipidemia, aortic stenosis status post bioprosthetic AVR, nonischemic cardiomyopathy status post AICD, chronic diastolic heart failure, CAD, Parkinson's and mild dementia presented to the emergency department from a facility after having several episodes of syncope.  Patient son and chart review indicate over the last several months these episodes have been occurring once or twice a week.  [name] reports she is usually back to her baseline orally after episodes.  No associated symptoms except fatigue.  She was evaluated December 2020 similar symptoms were thought to be secondary to orthostatic hypotension.  Patient admitted for further management   Assessment/Plan:   Principal Problem:   Syncopal episodes Active Problems:   Hyperlipidemia   Hyperparathyroidism, primary ([place])   S/P AVR (aortic valve replacement)   Dementia (HCC)   Thrombocytopenia (HCC)   Dementia due to Parkinson's disease without behavioral disturbance (HCC)   Renal insufficiency   Orthostatic hypotension  Syncope Etiology unclear.  Reportedly during these episodes her eyes rolled back there is some shaking of her upper extremities TSH and CK within the limits of normal CT of the head without acute abnormality Case discussed with neurology who felt seizures were less likely and the patient could be having convulsive syncope due to orthostatic hypotension as patients with Parkinson's disease suffer from autonomic dysfunction Echo done in 08/2019 showed grade 1 diastolic dysfunction  Orthostatic hypotension/hypertension Home medications include Lasix, Coreg, olmesartan Continue to hold Lasix -stop  iv fluids -TED hose -monitor orthostatics vitals  Nonischemic cardiomyopathy status post ICD Chart review indicates ICD was interrogated while in the ED and reported to be functioning properly. -daily weight -intake and output -Home medications as noted above  Chronic diastolic heart failure Echocardiogram done in December 2020 revealed an EF of 55% with grade 1 diastolic dysfunction.  Appears compensated -Continue to hold home Lasix -Monitor intake and output -Obtain daily weights  Aortic stenosis status post prosthetic valve replacement -Continue aspirin  Hyperlipidemia -Continue statin  Depression -Continue home meds  Dementia/Parkinson's Stable, oriented to place time and person -continue with outpatient follow-up with neurology -Continue home meds    Family Communication/Anticipated D/C date and plan/Code Status   DVT prophylaxis: Lovenox ordered. Code Status: Full Code.  Family Communication: Plan to discuss with family Disposition Plan: back to facility likely on 09/29/2019   Medical Consultants:    None.   Anti-Infectives:    None  Subjective:   Patient seen and examined at bedside.  Remains somewhat intermittently confused, unsure of baseline.  Denies any chest pain, abdominal pain, nausea/vomiting, fever/chills.  Objective:    Vitals:   12/23/19 1622 12/23/19 2338 12/24/19 0841 12/24/19 1628  BP: (!) 171/107 (!) 143/92 (!) 175/99 (!) 145/100  Pulse: 63 64 63 70  Resp: 15 18 16  (!) 26  Temp: 99.3 F (37.4 C) 97.8 F (36.6 C) 98.2 F (36.8 C) 98 F (36.7 C)  TempSrc: Oral Oral Tympanic Oral  SpO2: 99% 100% 100% 100%  Weight:      Height:        Intake/Output Summary (Last 24 hours) at 12/24/2019 1646 Last data filed at 12/24/2019 0500 Gross per 24 hour  Intake --  Output 600 ml  Net -600 ml   Filed Weights   12/22/19 1207  Weight: 58.1 kg    Exam:  General: NAD, intermittently confused, chronically  ill-appearing  Cardiovascular: S1, S2 present  Respiratory: CTAB  Abdomen: Soft, nontender, nondistended, bowel sounds present  Musculoskeletal: No bilateral pedal edema noted  Skin: Normal  Psychiatry: Normal mood   Data Reviewed:   I have personally reviewed following labs and imaging studies:  Labs: Labs show the following:   Basic Metabolic Panel: Recent Labs  Lab 12/22/19 1220 12/22/19 1220 12/23/19 0339 12/24/19 0257  NA 137  --  139 134*  K 4.9   < > 4.2 4.6  CL 106  --  105 105  CO2 21*  --  23 18*  GLUCOSE 77  --  94 86  BUN 21  --  18 19  CREATININE 1.06*  --  0.84 0.89  CALCIUM 9.7  --  10.4* 10.0   < > = values in this interval not displayed.   GFR Estimated Creatinine Clearance: 54.4 mL/min (by C-G formula based on SCr of 0.89 mg/dL). Liver Function Tests: No results for input(s): AST, ALT, ALKPHOS, BILITOT, PROT, ALBUMIN in the last 168 hours. No results for input(s): LIPASE, AMYLASE in the last 168 hours. No results for input(s): AMMONIA in the last 168 hours. Coagulation profile No results for input(s): INR, PROTIME in the last 168 hours.  CBC: Recent Labs  Lab 12/22/19 1220 12/23/19 0339 12/24/19 0257  WBC 3.7* 4.0 4.3  NEUTROABS 1.4*  --   --   HGB 14.1 14.9 15.2*  HCT 44.7 46.9* 47.5*  MCV 90.5 89.8 88.8  PLT 140* 159 145*   Cardiac Enzymes: Recent Labs  Lab 12/22/19 1519  CKTOTAL 138   BNP (last 3 results) No results for input(s): PROBNP in the last 8760 hours. CBG: Recent Labs  Lab 12/22/19 1658  GLUCAP 93   D-Dimer: No results for input(s): DDIMER in the last 72 hours. Hgb A1c: No results for input(s): HGBA1C in the last 72 hours. Lipid Profile: No results for input(s): CHOL, HDL, LDLCALC, TRIG, CHOLHDL, LDLDIRECT in the last 72 hours. Thyroid function studies: Recent Labs    12/22/19 1519  TSH 2.156   Anemia work up: No results for input(s): VITAMINB12, FOLATE, FERRITIN, TIBC, IRON, RETICCTPCT in the last  72 hours. Sepsis Labs: Recent Labs  Lab 12/22/19 1220 12/23/19 0339 12/24/19 0257  WBC 3.7* 4.0 4.3    Microbiology Recent Results (from the past 240 hour(s))  SARS CORONAVIRUS 2 (TAT 6-24 HRS) Nasopharyngeal Nasopharyngeal Swab     Status: None   Collection Time: 12/22/19  2:54 PM   Specimen: Nasopharyngeal Swab  Result Value Ref Range Status   SARS Coronavirus 2 NEGATIVE NEGATIVE Final    Comment: (NOTE) SARS-CoV-2 target nucleic acids are NOT DETECTED. The SARS-CoV-2 RNA is generally detectable in upper and lower respiratory specimens during the acute phase of infection. Negative results do not preclude SARS-CoV-2 infection, do not rule out co-infections with other pathogens, and should not be used as the sole basis for treatment or other patient management decisions. Negative results must be combined with clinical observations, patient history, and epidemiological information. The expected result is Negative. Fact Sheet for Patients: SugarRoll.be Fact Sheet for Healthcare Providers: https://www.woods-mathews.com/ This test is not yet approved or cleared by the [place] FDA and  has been authorized for detection and/or diagnosis of SARS-CoV-2 by FDA under an Emergency Use Authorization (EUA). This EUA will remain  in  effect (meaning this test can be used) for the duration of the COVID-19 declaration under Section 56 4(b)(1) of the Act, 21 U.S.C. section 360bbb-3(b)(1), unless the authorization is terminated or revoked sooner. Performed at [facility], [place] [address] [place], [place] [address]     Procedures and diagnostic studies:  No results found.  Medications:   . aspirin EC  81 mg Oral Daily  . carbidopa-levodopa  1 tablet Oral BID  . carvedilol  12.5 mg Oral BID WC  . donepezil  10 mg Oral Daily  . enoxaparin (LOVENOX) injection  40 mg Subcutaneous Q24H  . irbesartan  300 mg Oral Daily  . polyethylene  glycol  17 g Oral Daily  . rosuvastatin  10 mg Oral Daily  . sertraline  100 mg Oral Daily  . sodium chloride flush  3 mL Intravenous Q12H   Continuous Infusions:    LOS: 1 day   Alma Friendly MD [facility]   How to contact the [facility] Attending or Consulting provider [place] or covering provider during after hours [place], for this patient?  1. Check the care team in [facility] and look for a) attending/consulting TRH provider listed and b) the [facility] team listed 2. Log into [email or web address] and use [place]'s universal password to access. If you do not have the password, please contact the hospital operator. 3. Locate the [facility] provider you are looking for under [facility] and page to a number that you can be directly reached. 4. If you still have difficulty reaching the provider, please page the [facility] (Director on Call) for the Hospitalists listed on amion for assistance.  12/24/2019, 4:46 PM

## 2019-12-25 LAB — CBC
HCT: 41.6 % (ref 36.0–46.0)
Hemoglobin: 13.4 g/dL (ref 12.0–15.0)
MCH: 28.5 pg (ref 26.0–34.0)
MCHC: 32.2 g/dL (ref 30.0–36.0)
MCV: 88.5 fL (ref 80.0–100.0)
Platelets: 143 10*3/uL — ABNORMAL LOW (ref 150–400)
RBC: 4.7 MIL/uL (ref 3.87–5.11)
RDW: 15.5 % (ref 11.5–15.5)
WBC: 4.7 10*3/uL (ref 4.0–10.5)
nRBC: 0 % (ref 0.0–0.2)

## 2019-12-25 LAB — BASIC METABOLIC PANEL
Anion gap: 12 (ref 5–15)
BUN: 30 mg/dL — ABNORMAL HIGH (ref 8–23)
CO2: 19 mmol/L — ABNORMAL LOW (ref 22–32)
Calcium: 10.2 mg/dL (ref 8.9–10.3)
Chloride: 104 mmol/L (ref 98–111)
Creatinine, Ser: 1.29 mg/dL — ABNORMAL HIGH (ref 0.44–1.00)
GFR calc Af Amer: 50 mL/min — ABNORMAL LOW (ref >60)
GFR calc non Af Amer: 43 mL/min — ABNORMAL LOW (ref >60)
Glucose, Bld: 100 mg/dL — ABNORMAL HIGH (ref 70–99)
Potassium: 4.2 mmol/L (ref 3.5–5.1)
Sodium: 135 mmol/L (ref 135–145)

## 2019-12-25 MED ORDER — HYDRALAZINE HCL 20 MG/ML IJ SOLN
10.0000 mg | Freq: Once | INTRAMUSCULAR | Status: AC
  Administered 2019-12-25: 10 mg via INTRAVENOUS
  Filled 2019-12-25: qty 1

## 2019-12-25 MED ORDER — SODIUM CHLORIDE 0.9 % IV SOLN: INTRAVENOUS | Status: DC

## 2019-12-25 NOTE — Progress Notes (Signed)
Progress Note    [name]  HLK:[id number] DOB: 03/05/1956  DOA: 12/22/2019 PCP: Lujean Amel, MD    Brief Narrative:   Chief complaint: syncope  Medical records reviewed and are as summarized below:  [name] is an 64 y.o. female with a past medical history significant for hypertension, hyperlipidemia, aortic stenosis status post bioprosthetic AVR, nonischemic cardiomyopathy status post AICD, chronic diastolic heart failure, CAD, Parkinson's and mild dementia presented to the emergency department from a facility after having several episodes of syncope.  Patient son and chart review indicate over the last several months these episodes have been occurring once or twice a week.  [name] reports she is usually back to her baseline orally after episodes.  No associated symptoms except fatigue.  She was evaluated December 2020 similar symptoms were thought to be secondary to orthostatic hypotension.  Patient admitted for further management   Assessment/Plan:   Principal Problem:   Syncopal episodes Active Problems:   Hyperlipidemia   Hyperparathyroidism, primary ([place])   S/P AVR (aortic valve replacement)   Dementia (HCC)   Thrombocytopenia (HCC)   Dementia due to Parkinson's disease without behavioral disturbance (HCC)   Renal insufficiency   Orthostatic hypotension  Syncope Etiology unclear, possibly related to orthostatic hypotension Reportedly during these episodes her eyes rolled back there is some shaking of her upper extremities TSH and CK within the limits of normal CT of the head without acute abnormality Case discussed with neurology who felt seizures were less likely and the patient could be having convulsive syncope due to orthostatic hypotension as patients with Parkinson's disease suffer from autonomic dysfunction Echo done in 08/2019 showed grade 1 diastolic dysfunction, normal EF  Orthostatic hypotension/hypertension Still with positive  orthostatics on 12/25/19 Home medications include Lasix, Coreg, olmesartan Continue to hold Lasix  Restart iv fluids -TED hose -monitor orthostatics vitals  Nonischemic cardiomyopathy status post ICD Appears euvolemic Chart review indicates ICD was interrogated while in the ED and reported to be functioning properly. -daily weight -intake and output -Home medications as noted above  Chronic diastolic heart failure Echocardiogram done in December 2020 revealed an EF of 55% with grade 1 diastolic dysfunction.  Appears compensated -Continue to hold home Lasix -Monitor intake and output -Obtain daily weights  Aortic stenosis status post prosthetic valve replacement -Continue aspirin  Hyperlipidemia -Continue statin  Depression -Continue home meds  Dementia/Parkinson's Stable, oriented to place time and person -continue with outpatient follow-up with neurology -Continue home meds    Family Communication/Anticipated D/C date and plan/Code Status   DVT prophylaxis: Lovenox ordered. Code Status: Full Code.  Family Communication: Discussed with son over the phone on 12/25/2019 Disposition Plan: back to facility likely on 12/26/2019   Medical Consultants:    None.   Anti-Infectives:    None  Subjective:   Patient seen and examined at bedside.  Just woke up, waiting on breakfast, reports she feels weak, still noted to be orthostatic.  IV fluids restarted.  Denies any chest pain, abdominal pain, nausea/vomiting, shortness of breath, fever/chills.  Intermittent confusion noted  Objective:    Vitals:   12/25/19 1034 12/25/19 1036 12/25/19 1041 12/25/19 1341  BP: (!) 151/97 (!) 112/93 (!) 109/91 119/84  Pulse: 72 65 69 75  Resp:    18  Temp:    98.5 F (36.9 C)  TempSrc:    Oral  SpO2: 99% 98% 100% 99%  Weight:      Height:  Intake/Output Summary (Last 24 hours) at 12/25/2019 1629 Last data filed at 12/24/2019 2034 Gross per 24 hour  Intake 3 ml  Output --   Net 3 ml   Filed Weights   12/22/19 1207  Weight: 58.1 kg    Exam:  General: NAD, chronically ill-appearing, intermittent confusion  Cardiovascular: S1, S2 present  Respiratory: CTAB  Abdomen: Soft, nontender, nondistended, bowel sounds present  Musculoskeletal: No bilateral pedal edema noted  Skin: Normal  Psychiatry: Normal mood   Data Reviewed:   I have personally reviewed following labs and imaging studies:  Labs: Labs show the following:   Basic Metabolic Panel: Recent Labs  Lab 12/22/19 1220 12/22/19 1220 12/23/19 0339 12/23/19 0339 12/24/19 0257 12/25/19 0110  NA 137  --  139  --  134* 135  K 4.9   < > 4.2   < > 4.6 4.2  CL 106  --  105  --  105 104  CO2 21*  --  23  --  18* 19*  GLUCOSE 77  --  94  --  86 100*  BUN 21  --  18  --  19 30*  CREATININE 1.06*  --  0.84  --  0.89 1.29*  CALCIUM 9.7  --  10.4*  --  10.0 10.2   < > = values in this interval not displayed.   GFR Estimated Creatinine Clearance: 37.5 mL/min (A) (by C-G formula based on SCr of 1.29 mg/dL (H)). Liver Function Tests: No results for input(s): AST, ALT, ALKPHOS, BILITOT, PROT, ALBUMIN in the last 168 hours. No results for input(s): LIPASE, AMYLASE in the last 168 hours. No results for input(s): AMMONIA in the last 168 hours. Coagulation profile No results for input(s): INR, PROTIME in the last 168 hours.  CBC: Recent Labs  Lab 12/22/19 1220 12/23/19 0339 12/24/19 0257 12/25/19 0110  WBC 3.7* 4.0 4.3 4.7  NEUTROABS 1.4*  --   --   --   HGB 14.1 14.9 15.2* 13.4  HCT 44.7 46.9* 47.5* 41.6  MCV 90.5 89.8 88.8 88.5  PLT 140* 159 145* 143*   Cardiac Enzymes: Recent Labs  Lab 12/22/19 1519  CKTOTAL 138   BNP (last 3 results) No results for input(s): PROBNP in the last 8760 hours. CBG: Recent Labs  Lab 12/22/19 1658  GLUCAP 93   D-Dimer: No results for input(s): DDIMER in the last 72 hours. Hgb A1c: No results for input(s): HGBA1C in the last 72  hours. Lipid Profile: No results for input(s): CHOL, HDL, LDLCALC, TRIG, CHOLHDL, LDLDIRECT in the last 72 hours. Thyroid function studies: No results for input(s): TSH, T4TOTAL, T3FREE, THYROIDAB in the last 72 hours.  Invalid input(s): FREET3 Anemia work up: No results for input(s): VITAMINB12, FOLATE, FERRITIN, TIBC, IRON, RETICCTPCT in the last 72 hours. Sepsis Labs: Recent Labs  Lab 12/22/19 1220 12/23/19 0339 12/24/19 0257 12/25/19 0110  WBC 3.7* 4.0 4.3 4.7    Microbiology Recent Results (from the past 240 hour(s))  SARS CORONAVIRUS 2 (TAT 6-24 HRS) Nasopharyngeal Nasopharyngeal Swab     Status: None   Collection Time: 12/22/19  2:54 PM   Specimen: Nasopharyngeal Swab  Result Value Ref Range Status   SARS Coronavirus 2 NEGATIVE NEGATIVE Final    Comment: (NOTE) SARS-CoV-2 target nucleic acids are NOT DETECTED. The SARS-CoV-2 RNA is generally detectable in upper and lower respiratory specimens during the acute phase of infection. Negative results do not preclude SARS-CoV-2 infection, do not rule out co-infections with other pathogens,  and should not be used as the sole basis for treatment or other patient management decisions. Negative results must be combined with clinical observations, patient history, and epidemiological information. The expected result is Negative. Fact Sheet for Patients: SugarRoll.be Fact Sheet for Healthcare Providers: https://www.woods-mathews.com/ This test is not yet approved or cleared by the [place] FDA and  has been authorized for detection and/or diagnosis of SARS-CoV-2 by FDA under an Emergency Use Authorization (EUA). This EUA will remain  in effect (meaning this test can be used) for the duration of the COVID-19 declaration under Section 56 4(b)(1) of the Act, 21 U.S.C. section 360bbb-3(b)(1), unless the authorization is terminated or revoked sooner. Performed at [facility], [place] [address], [place], [place] [address]     Procedures and diagnostic studies:  No results found.  Medications:   . aspirin EC  81 mg Oral Daily  . carbidopa-levodopa  1 tablet Oral BID  . carvedilol  12.5 mg Oral BID WC  . donepezil  10 mg Oral Daily  . enoxaparin (LOVENOX) injection  40 mg Subcutaneous Q24H  . polyethylene glycol  17 g Oral Daily  . rosuvastatin  10 mg Oral Daily  . sertraline  100 mg Oral Daily  . sodium chloride flush  3 mL Intravenous Q12H   Continuous Infusions: . sodium chloride 100 mL/hr at 12/25/19 1212     LOS: 2 days   Alma Friendly MD [facility]   How to contact the [facility] Attending or Consulting provider [place] or covering provider during after hours [place], for this patient?  1. Check the care team in [facility] and look for a) attending/consulting TRH provider listed and b) the [facility] team listed 2. Log into [email or web address] and use [place]'s universal password to access. If you do not have the password, please contact the hospital operator. 3. Locate the [facility] provider you are looking for under [facility] and page to a number that you can be directly reached. 4. If you still have difficulty reaching the provider, please page the [facility] (Director on Call) for the Hospitalists listed on amion for assistance.  12/25/2019, 4:29 PM

## 2019-12-25 NOTE — Care Management Important Message (Signed)
Important Message  Patient Details  Name: [name] MRN: [id number] Date of Birth: [birth year]   Medicare Important Message Given:  Yes     Orbie Pyo 12/25/2019, 3:48 PM

## 2019-12-25 NOTE — TOC Initial Note (Signed)
Transition of Care [facility]) - Initial/Assessment Note    Patient Details  Name: [name] MRN: [id number] Date of Birth: Aug 04, 1956  Transition of Care [facility]) CM/SW Contact:    Benard Halsted, LCSW Phone Number: 12/25/2019, 4:20 PM  Clinical Narrative:                 CSW spoke with patient's son to confirm discharge back to [facility] memory care once medically stable. [name] confirmed plan. CSW spoke with Roselyn Reef at [facility]. She reported patient can return at discharge and does not need an updated COVID test for return as they will test her once she returns. She reports they can provide home health therapy for patient. No other concerns reported.   Expected Discharge Plan: Memory Care Barriers to Discharge: Continued Medical Work up   Patient Goals and CMS Choice Patient states their goals for this hospitalization and ongoing recovery are:: Return to [facility] Compare Post Acute Care list provided to:: Patient Represenative (must comment)(Son) Choice offered to / list presented to : Adult Children, Patient  Expected Discharge Plan and Services Expected Discharge Plan: Memory Care In-house Referral: Clinical Social Work   Post Acute Care Choice: (Memory care) Living arrangements for the past 2 months: [place]                                      Prior Living Arrangements/Services Living arrangements for the past 2 months: [place] Lives with:: Facility Resident Patient language and need for interpreter reviewed:: Yes Do you feel safe going back to the place where you live?: Yes      Need for Family Participation in Patient Care: Yes (Comment) Care giver support system in place?: Yes (comment)   Criminal Activity/Legal Involvement Pertinent to Current Situation/Hospitalization: No - Comment as needed  Activities of Daily Living Home Assistive Devices/Equipment: None ADL Screening (condition at  time of admission) Patient's cognitive ability adequate to safely complete daily activities?: Yes Is the patient deaf or have difficulty hearing?: No Does the patient have difficulty seeing, even when wearing glasses/contacts?: Yes Does the patient have difficulty concentrating, remembering, or making decisions?: Yes Patient able to express need for assistance with ADLs?: Yes Does the patient have difficulty dressing or bathing?: Yes Independently performs ADLs?: No Communication: Independent Does the patient have difficulty walking or climbing stairs?: Yes Weakness of Legs: Both Weakness of Arms/Hands: Both  Permission Sought/Granted Permission sought to share information with : Facility Sport and exercise psychologist, Family Supports Permission granted to share information with : Yes, Verbal Permission Granted  Share Information with NAME: [name]  Permission granted to share info w AGENCY: [place] granted to share info w Relationship: Son  Permission granted to share info w Contact Information: [phone]  Emotional Assessment Appearance:: Appears stated age Attitude/Demeanor/Rapport: Unable to Assess Affect (typically observed): Unable to Assess Orientation: : Oriented to Self, Oriented to Place, Oriented to Situation(Dementia) Alcohol / Substance Use: Not Applicable Psych Involvement: No (comment)  Admission diagnosis:  Syncope and collapse [R55] Episodic altered awareness [R40.4] Orthostatic hypotension [I95.1] Patient Active Problem List   Diagnosis Date Noted  . Orthostatic hypotension 12/23/2019  . Syncopal episodes 12/22/2019  . Dementia due to Parkinson's disease without behavioral disturbance ([place]) 12/22/2019  . Renal insufficiency 12/22/2019  . Near syncope 09/04/2019  . AKI (acute kidney injury) ([place]) 09/04/2019  . Hypokalemia  09/04/2019  . Depression 09/04/2019  . Dementia ([place]) 09/04/2019  . Thrombocytopenia ([place]) 09/04/2019  . Low back  pain 06/25/2019  . S/P AVR (aortic valve replacement) 12/12/2016  . Aortic stenosis 11/16/2016  . Severe aortic stenosis   . Polycythemia 06/06/2016  . Menopausal state 02/16/2016  . Hyperparathyroidism, primary ([place]) 02/16/2016  . Chronic systolic heart failure ([place]) 05/07/2014  . Hx of colonic polyps 06/17/2013  . Ventricular tachycardia ([place]) 02/21/2011  . Automatic implantable cardioverter-defibrillator in situ 06/02/2009  . Hyperlipidemia 05/29/2009  . SMOKER 05/29/2009  . Essential hypertension 05/29/2009  . Aortic valve disorder 05/29/2009  . CARDIOMYOPATHY, SECONDARY 05/29/2009  . CHF 05/29/2009  . BICUSPID AORTIC VALVE 05/29/2009  . LIVER FUNCTION TESTS, ABNORMAL, HX OF 05/29/2009  . CHOLECYSTITIS, HX OF 05/29/2009   PCP:  Lujean Amel, MD Pharmacy:   [facility] [place] [address] Phone: [phone] Fax: [phone]  [place] [place] [address] Phone: [phone] Fax: [phone]  [id number] - [place], [place] - [address] AT [place] [address] [place] [address] Phone: [phone] Fax: [phone]  [facility] [phone] [place], [place] - [place] AT [place] [address] [place] [address] Phone: [phone] Fax: [phone]     Social Determinants of Health ([place]) Interventions    Readmission Risk Interventions Readmission Risk Prevention Plan 12/25/2019  Transportation Screening Complete  PCP or Specialist Appt within 5-7 Days Complete  Home Care Screening Complete  Medication Review (RN CM) Complete  Some recent data might be hidden

## 2019-12-26 LAB — BASIC METABOLIC PANEL
Anion gap: 10 (ref 5–15)
BUN: 19 mg/dL (ref 8–23)
CO2: 19 mmol/L — ABNORMAL LOW (ref 22–32)
Calcium: 9.8 mg/dL (ref 8.9–10.3)
Chloride: 110 mmol/L (ref 98–111)
Creatinine, Ser: 0.72 mg/dL (ref 0.44–1.00)
GFR calc Af Amer: >60 mL/min (ref >60)
GFR calc non Af Amer: >60 mL/min (ref >60)
Glucose, Bld: 91 mg/dL (ref 70–99)
Potassium: 4 mmol/L (ref 3.5–5.1)
Sodium: 139 mmol/L (ref 135–145)

## 2019-12-26 MED ORDER — IRBESARTAN 150 MG PO TABS
150.0000 mg | ORAL_TABLET | Freq: Every day | ORAL | Status: DC
  Administered 2019-12-26: 150 mg via ORAL
  Filled 2019-12-26: qty 1

## 2019-12-26 NOTE — NC FL2 (Signed)
[place] LEVEL OF CARE SCREENING TOOL     IDENTIFICATION  Patient Name: [name] Birthdate: 04/14/1956 Sex: female Admission Date (Current Location): 12/22/2019  [facility] [address] and [place] Number:  Herbalist and Address:  [facility] [place], [place] [address] [place], [place] [address]      Provider Number: [id number]  Attending Physician Name and Address:  Alma Friendly, MD  Relative Name and Phone Number:  [name] [phone]    Current Level of Care: Hospital Recommended Level of Care: Memory Care Prior Approval Number:    Date Approved/Denied:   PASRR Number:    Discharge Plan: Other (Comment)(Memory care)    Current Diagnoses: Patient Active Problem List   Diagnosis Date Noted  . Orthostatic hypotension 12/23/2019  . Syncopal episodes 12/22/2019  . Dementia due to Parkinson's disease without behavioral disturbance ([place]) 12/22/2019  . Renal insufficiency 12/22/2019  . Near syncope 09/04/2019  . AKI (acute kidney injury) ([place]) 09/04/2019  . Hypokalemia 09/04/2019  . Depression 09/04/2019  . Dementia ([place]) 09/04/2019  . Thrombocytopenia ([place]) 09/04/2019  . Low back pain 06/25/2019  . S/P AVR (aortic valve replacement) 12/12/2016  . Aortic stenosis 11/16/2016  . Severe aortic stenosis   . Polycythemia 06/06/2016  . Menopausal state 02/16/2016  . Hyperparathyroidism, primary ([place]) 02/16/2016  . Chronic systolic heart failure ([place]) 05/07/2014  . Hx of colonic polyps 06/17/2013  . Ventricular tachycardia ([place]) 02/21/2011  . Automatic implantable cardioverter-defibrillator in situ 06/02/2009  . Hyperlipidemia 05/29/2009  . SMOKER 05/29/2009  . Essential hypertension 05/29/2009  . Aortic valve disorder 05/29/2009  . CARDIOMYOPATHY, SECONDARY 05/29/2009  . CHF 05/29/2009  . BICUSPID AORTIC VALVE 05/29/2009  . LIVER FUNCTION TESTS, ABNORMAL, HX OF 05/29/2009  . CHOLECYSTITIS, HX OF 05/29/2009     Orientation RESPIRATION BLADDER Height & Weight     Self, Situation, Place  Normal Incontinent, External catheter Weight: 128 lb (58.1 kg) Height:  5\' 4"  (162.6 cm)  BEHAVIORAL SYMPTOMS/MOOD NEUROLOGICAL BOWEL NUTRITION STATUS      Incontinent Diet(Low sodium)  AMBULATORY STATUS COMMUNICATION OF NEEDS Skin   Limited Assist Verbally Normal                       Personal Care Assistance Level of Assistance  Bathing, Feeding, Dressing Bathing Assistance: Limited assistance Feeding assistance: Limited assistance Dressing Assistance: Limited assistance     Functional Limitations Info             SPECIAL CARE FACTORS FREQUENCY  PT (By licensed PT)     PT Frequency: home health PT              Contractures Contractures Info: Not present    Additional Factors Info  Code Status, Allergies, Psychotropic Code Status Info: Full Allergies Info: Trospium Chloride Er Psychotropic Info: Zoloft         Current Medications (12/26/2019):   Discharge Medications: TAKE these medications   acetaminophen 325 MG tablet Commonly known as: TYLENOL Take 325 mg by mouth every 6 (six) hours as needed for mild pain.   aspirin EC 81 MG tablet Take 1 tablet (81 mg total) daily by mouth. What changed:   when to take this  reasons to take this   carbidopa-levodopa 25-100 MG tablet Commonly known as: SINEMET IR Take 1 tablet twice a day at breakfast and dinner   carvedilol 12.5 MG tablet Commonly known as: COREG Take 1 tablet by mouth 2 (two)  times a day. With food   donepezil 10 MG tablet Commonly known as: ARICEPT Take 1/2 tablet daily for 2 weeks, then increase to 1 tablet daily What changed:   how much to take  how to take this  when to take this  additional instructions   furosemide 20 MG tablet Commonly known as: LASIX Take 1 tablet by mouth daily.   multivitamin with minerals Tabs tablet Take 1 tablet by mouth daily.   olmesartan 20 MG  tablet Commonly known as: BENICAR Take 20 mg by mouth daily.   polyethylene glycol 17 g packet Commonly known as: MIRALAX / GLYCOLAX Take 17 g by mouth daily.   rosuvastatin 10 MG tablet Commonly known as: CRESTOR Take 1 tablet (10 mg total) by mouth daily.   sertraline 100 MG tablet Commonly known as: ZOLOFT Take 1 tablet (100 mg total) by mouth daily. What changed:   when to take this  reasons to take this   SYSTANE OP Place 1-2 drops into both eyes 3 (three) times daily as needed (for dry eyes.).     Relevant Imaging Results:  Relevant Lab Results:   Additional Information SSN [place] negative 3/29  Benard Halsted, [place]

## 2019-12-26 NOTE — Plan of Care (Signed)
  Problem: Education: Goal: Knowledge of General Education information will improve Description: Including pain rating scale, medication(s)/side effects and non-pharmacologic comfort measures 12/26/2019 1438 by Lisbeth Renshaw, RN Outcome: Adequate for Discharge 12/26/2019 1438 by Lisbeth Renshaw, RN Outcome: Adequate for Discharge   Problem: Health Behavior/Discharge Planning: Goal: Ability to manage health-related needs will improve 12/26/2019 1438 by Lisbeth Renshaw, RN Outcome: Adequate for Discharge 12/26/2019 1438 by Lisbeth Renshaw, RN Outcome: Adequate for Discharge   Problem: Clinical Measurements: Goal: Ability to maintain clinical measurements within normal limits will improve 12/26/2019 1438 by Lisbeth Renshaw, RN Outcome: Adequate for Discharge 12/26/2019 1438 by Lisbeth Renshaw, RN Outcome: Adequate for Discharge Goal: Will remain free from infection 12/26/2019 1438 by Lisbeth Renshaw, RN Outcome: Adequate for Discharge 12/26/2019 1438 by Lisbeth Renshaw, RN Outcome: Adequate for Discharge Goal: Diagnostic test results will improve 12/26/2019 1438 by Lisbeth Renshaw, RN Outcome: Adequate for Discharge 12/26/2019 1438 by Lisbeth Renshaw, RN Outcome: Adequate for Discharge Goal: Respiratory complications will improve 12/26/2019 1438 by Lisbeth Renshaw, RN Outcome: Adequate for Discharge 12/26/2019 1438 by Lisbeth Renshaw, RN Outcome: Adequate for Discharge Goal: Cardiovascular complication will be avoided 12/26/2019 1438 by Lisbeth Renshaw, RN Outcome: Adequate for Discharge 12/26/2019 1438 by Lisbeth Renshaw, RN Outcome: Adequate for Discharge   Problem: Activity: Goal: Risk for activity intolerance will decrease 12/26/2019 1438 by Lisbeth Renshaw, RN Outcome: Adequate for Discharge 12/26/2019 1438 by Lisbeth Renshaw, RN Outcome: Adequate for Discharge   Problem: Nutrition: Goal: Adequate nutrition will be maintained 12/26/2019 1438 by Lisbeth Renshaw, RN Outcome: Adequate for Discharge 12/26/2019 1438  by Lisbeth Renshaw, RN Outcome: Adequate for Discharge   Problem: Coping: Goal: Level of anxiety will decrease 12/26/2019 1438 by Lisbeth Renshaw, RN Outcome: Adequate for Discharge 12/26/2019 1438 by Lisbeth Renshaw, RN Outcome: Adequate for Discharge 12/26/2019 1148 by Lisbeth Renshaw, RN Outcome: Progressing   Problem: Elimination: Goal: Will not experience complications related to bowel motility 12/26/2019 1438 by Lisbeth Renshaw, RN Outcome: Adequate for Discharge 12/26/2019 1438 by Lisbeth Renshaw, RN Outcome: Adequate for Discharge 12/26/2019 1148 by Lisbeth Renshaw, RN Outcome: Progressing Goal: Will not experience complications related to urinary retention 12/26/2019 1438 by Lisbeth Renshaw, RN Outcome: Adequate for Discharge 12/26/2019 1438 by Lisbeth Renshaw, RN Outcome: Adequate for Discharge   Problem: Pain Managment: Goal: General experience of comfort will improve 12/26/2019 1438 by Lisbeth Renshaw, RN Outcome: Adequate for Discharge 12/26/2019 1438 by Lisbeth Renshaw, RN Outcome: Adequate for Discharge 12/26/2019 1148 by Lisbeth Renshaw, RN Outcome: Progressing   Problem: Safety: Goal: Ability to remain free from injury will improve 12/26/2019 1438 by Lisbeth Renshaw, RN Outcome: Adequate for Discharge 12/26/2019 1438 by Lisbeth Renshaw, RN Outcome: Adequate for Discharge 12/26/2019 1148 by Lisbeth Renshaw, RN Outcome: Progressing   Problem: Skin Integrity: Goal: Risk for impaired skin integrity will decrease 12/26/2019 1438 by Lisbeth Renshaw, RN Outcome: Adequate for Discharge 12/26/2019 1438 by Lisbeth Renshaw, RN Outcome: Adequate for Discharge 12/26/2019 1148 by Lisbeth Renshaw, RN Outcome: Progressing

## 2019-12-26 NOTE — Plan of Care (Signed)

## 2019-12-26 NOTE — Final Progress Note (Deleted)
PIV removed. AVS discussed with patient, all questions and concerns answered. Taxi voucher given to patient due to car being at [place]. At time of discharge, patient acknowledged having all belongings. Discharge home-self care with outpatient PT referral. Awaiting taxi for discharge. 

## 2019-12-26 NOTE — Discharge Summary (Addendum)
Discharge Summary  [name] BZJ:[id number] DOB: 18-Mar-1956  PCP: Lujean Amel, MD  Admit date: 12/22/2019 Discharge date: 12/26/2019  Time spent: 40 mins  Recommendations for Outpatient Follow-up:  1. PCP in 1 week with repeat labs and follow-up  Discharge Diagnoses:  Active Hospital Problems   Diagnosis Date Noted  . Syncopal episodes 12/22/2019  . Orthostatic hypotension 12/23/2019  . Dementia due to Parkinson's disease without behavioral disturbance ([place]) 12/22/2019  . Renal insufficiency 12/22/2019  . Thrombocytopenia ([place]) 09/04/2019  . Dementia ([place]) 09/04/2019  . S/P AVR (aortic valve replacement) 12/12/2016  . Hyperparathyroidism, primary ([place]) 02/16/2016  . Hyperlipidemia 05/29/2009    Resolved Hospital Problems  No resolved problems to display.    Discharge Condition: Stable  Diet recommendation: Heart healthy  Vitals:   12/26/19 0801 12/26/19 0935  BP: (!) 183/95 122/83  Pulse:    Resp:    Temp:    SpO2:      History of present illness:  [name] is an 64 y.o. female with a past medical history significant for hypertension, hyperlipidemia, aortic stenosis status post bioprosthetic AVR, nonischemic cardiomyopathy status post AICD, chronic diastolic heart failure, CAD, Parkinson's and mild dementia presented to the emergency department from a facility after having several episodes of syncope.  Patient son and chart review indicate over the last several months these episodes have been occurring once or twice a week.  [name] reports she is usually back to her baseline orally after episodes.  No associated symptoms except fatigue.  She was evaluated December 2020 similar symptoms were thought to be secondary to orthostatic hypotension.  Patient admitted for further management.   Today, patient denies any new complaints.  Denies any dizziness, chest pain, abdominal pain, nausea/vomiting, fever/chills, shortness of breath.  Was able to walk with  PT today.  Patient will be discharged to assisted living facility where home health PT/OT will be arranged for patient  Hospital Course:  Principal Problem:   Syncopal episodes Active Problems:   Hyperlipidemia   Hyperparathyroidism, primary ([place])   S/P AVR (aortic valve replacement)   Dementia (HCC)   Thrombocytopenia (HCC)   Dementia due to Parkinson's disease without behavioral disturbance ([place])   Renal insufficiency   Orthostatic hypotension   Syncope Etiology unclear, possibly related to orthostatic hypotension Reportedly during these episodes her eyes rolled back there is some shaking of her upper extremities TSH and CK within the limits of normal CT of the head without acute abnormality Case discussed with neurology who felt seizures were less likely and the patient could be having convulsive syncope due to orthostatic hypotension as patient with Parkinson's disease suffer from autonomic dysfunction Echo done in 08/2019 showed grade 1 diastolic dysfunction, normal EF Follow-up with PCP  Orthostatic hypotension/hypertension Improved Home medications include Lasix, Coreg, olmesartan TED hose Follow-up with PCP  NAGMA Follow-up with PCP, with repeat labs ?? Due to IVF/normal saline  Nonischemic cardiomyopathy status post ICD Appears euvolemic Chart review indicates ICD was interrogated while in the ED and reported to be functioning properly. Continue home medications  Chronic diastolic heart failure Echocardiogram done in December 2020 revealed an EF of 55% with grade 1 diastolic dysfunction.  Appears compensated Continue home medications  Aortic stenosis status post prosthetic valve replacement -Continue aspirin  Hyperlipidemia -Continue statin  Depression -Continue home meds  Dementia/Parkinson's Continue home medications         Malnutrition Type:      Malnutrition Characteristics:      Nutrition  Interventions:      Estimated  body mass index is 21.97 kg/m as calculated from the following:   Height as of this encounter: 5\' 4"  (1.626 m).   Weight as of this encounter: 58.1 kg.    Procedures:  None  Consultations:  None  Discharge Exam: BP 122/83   Pulse 63   Temp 98.1 F (36.7 C) (Oral)   Resp 14   Ht 5\' 4"  (1.626 m)   Wt 58.1 kg   SpO2 100%   BMI 21.97 kg/m   General: NAD Cardiovascular: S1, S2 present Respiratory: CTA B    Discharge Instructions You were cared for by a hospitalist during your hospital stay. If you have any questions about your discharge medications or the care you received while you were in the hospital after you are discharged, you can call the unit and asked to speak with the hospitalist on call if the hospitalist that took care of you is not available. Once you are discharged, your primary care physician will handle any further medical issues. Please note that NO REFILLS for any discharge medications will be authorized once you are discharged, as it is imperative that you return to your primary care physician (or establish a relationship with a primary care physician if you do not have one) for your aftercare needs so that they can reassess your need for medications and monitor your lab values.  Discharge Instructions    Diet - low sodium heart healthy   Complete by: As directed    Increase activity slowly   Complete by: As directed      Allergies as of 12/26/2019      Reactions   Trospium Chloride Er Other (See Comments)   Constipation       Medication List    TAKE these medications   acetaminophen 325 MG tablet Commonly known as: TYLENOL Take 325 mg by mouth every 6 (six) hours as needed for mild pain.   aspirin EC 81 MG tablet Take 1 tablet (81 mg total) daily by mouth. What changed:   when to take this  reasons to take this   carbidopa-levodopa 25-100 MG tablet Commonly known as: SINEMET IR Take 1 tablet twice a day at breakfast and dinner     carvedilol 12.5 MG tablet Commonly known as: COREG Take 1 tablet by mouth 2 (two) times a day. With food   donepezil 10 MG tablet Commonly known as: ARICEPT Take 1/2 tablet daily for 2 weeks, then increase to 1 tablet daily What changed:   how much to take  how to take this  when to take this  additional instructions   furosemide 20 MG tablet Commonly known as: LASIX Take 1 tablet by mouth daily.   multivitamin with minerals Tabs tablet Take 1 tablet by mouth daily.   olmesartan 20 MG tablet Commonly known as: BENICAR Take 20 mg by mouth daily.   polyethylene glycol 17 g packet Commonly known as: MIRALAX / GLYCOLAX Take 17 g by mouth daily.   rosuvastatin 10 MG tablet Commonly known as: CRESTOR Take 1 tablet (10 mg total) by mouth daily.   sertraline 100 MG tablet Commonly known as: ZOLOFT Take 1 tablet (100 mg total) by mouth daily. What changed:   when to take this  reasons to take this   SYSTANE OP Place 1-2 drops into both eyes 3 (three) times daily as needed (for dry eyes.).      Allergies  Allergen Reactions  . Trospium  Chloride Er Other (See Comments)    Constipation    Follow-up Information    Koirala, Dibas, MD. Schedule an appointment as soon as possible for a visit in 1 week(s).   Specialty: Family Medicine Contact information: [address] [place] [address] [phone]            The results of significant diagnostics from this hospitalization (including imaging, microbiology, ancillary and laboratory) are listed below for reference.    Significant Diagnostic Studies: CT Head Wo Contrast  Result Date: 12/22/2019 CLINICAL DATA:  Headache EXAM: CT HEAD WITHOUT CONTRAST TECHNIQUE: Contiguous axial images were obtained from the base of the skull through the vertex without intravenous contrast. COMPARISON:  06/13/2018 FINDINGS: Brain: No evidence of acute infarction, hemorrhage, hydrocephalus, extra-axial  collection or mass lesion/mass effect. Remote left basal ganglia lacunar infarct. Extensive low-density changes within the periventricular and subcortical white matter compatible with chronic microvascular ischemic change. Mild-moderate diffuse cerebral volume loss. Vascular: Atherosclerotic calcifications involving the large vessels of the skull base. No unexpected hyperdense vessel. Skull: Normal. Negative for fracture or focal lesion. Sinuses/Orbits: Partial opacification involving the left mastoid air cells. Right mastoid air cells are clear. Paranasal sinuses are clear. Orbital structures unremarkable. Other: None. IMPRESSION: 1. No acute intracranial findings. 2. Chronic microvascular ischemic changes and cerebral volume loss, similar to previous study. 3. Partial opacification involving the left mastoid air cells. Correlate for signs or symptoms of mastoiditis. Electronically Signed   By: Davina Poke D.O.   On: 12/22/2019 13:41   CT Cervical Spine Wo Contrast  Result Date: 12/22/2019 CLINICAL DATA:  Head trauma EXAM: CT CERVICAL SPINE WITHOUT CONTRAST TECHNIQUE: Multidetector CT imaging of the cervical spine was performed without intravenous contrast. Multiplanar CT image reconstructions were also generated. COMPARISON:  None. FINDINGS: Alignment: Normal alignment of the facet joints without dislocation. Dens and lateral masses remain aligned. Preserved cervical lordosis without static listhesis. Skull base and vertebrae: No acute fracture. No primary bone lesion or focal pathologic process. Soft tissues and spinal canal: No prevertebral fluid or swelling. No visible canal hematoma. Disc levels: Intervertebral disc spaces are maintained. Mild multilevel facet arthrosis, most pronounced at C3-4 and C4-5 on the right and C4-5 on the left. Upper chest: Visualized lung apices clear. Other: 9 mm low-density right thyroid lobe nodule. No follow-up recommended (ref: J Am Coll Radiol. 2015 Feb;12(2):  143-50). IMPRESSION: 1. No acute fracture or static listhesis. 2. Mild cervical spondylosis. Electronically Signed   By: Davina Poke D.O.   On: 12/22/2019 13:46   DG CHEST PORT 1 VIEW  Result Date: 12/22/2019 CLINICAL DATA:  Altered mental status with fall and episode of unconsciousness. EXAM: PORTABLE CHEST 1 VIEW COMPARISON:  Radiographs 09/04/2019 and 12/20/2016. FINDINGS: The heart size and mediastinal contours are stable status post median sternotomy and aortic valve replacement. Left subclavian AICD leads appear unchanged. The lungs are clear. There is no pleural effusion or pneumothorax. No acute osseous findings. Telemetry leads overlie the chest. IMPRESSION: Stable chest.  No active cardiopulmonary process. Electronically Signed   By: Richardean Sale M.D.   On: 12/22/2019 16:33    Microbiology: Recent Results (from the past 240 hour(s))  SARS CORONAVIRUS 2 (TAT 6-24 HRS) Nasopharyngeal Nasopharyngeal Swab     Status: None   Collection Time: 12/22/19  2:54 PM   Specimen: Nasopharyngeal Swab  Result Value Ref Range Status   SARS Coronavirus 2 NEGATIVE NEGATIVE Final    Comment: (NOTE) SARS-CoV-2 target nucleic acids are NOT  DETECTED. The SARS-CoV-2 RNA is generally detectable in upper and lower respiratory specimens during the acute phase of infection. Negative results do not preclude SARS-CoV-2 infection, do not rule out co-infections with other pathogens, and should not be used as the sole basis for treatment or other patient management decisions. Negative results must be combined with clinical observations, patient history, and epidemiological information. The expected result is Negative. Fact Sheet for Patients: SugarRoll.be Fact Sheet for Healthcare Providers: https://www.woods-mathews.com/ This test is not yet approved or cleared by the [place] FDA and  has been authorized for detection and/or diagnosis of SARS-CoV-2 by FDA  under an Emergency Use Authorization (EUA). This EUA will remain  in effect (meaning this test can be used) for the duration of the COVID-19 declaration under Section 56 4(b)(1) of the Act, 21 U.S.C. section 360bbb-3(b)(1), unless the authorization is terminated or revoked sooner. Performed at [facility], [place] [address], [place], [place] [address]      Labs: Basic Metabolic Panel: Recent Labs  Lab 12/22/19 1220 12/23/19 0339 12/24/19 0257 12/25/19 0110 12/26/19 0336  NA 137 139 134* 135 139  K 4.9 4.2 4.6 4.2 4.0  CL 106 105 105 104 110  CO2 21* 23 18* 19* 19*  GLUCOSE 77 94 86 100* 91  BUN 21 18 19  30* 19  CREATININE 1.06* 0.84 0.89 1.29* 0.72  CALCIUM 9.7 10.4* 10.0 10.2 9.8   Liver Function Tests: No results for input(s): AST, ALT, ALKPHOS, BILITOT, PROT, ALBUMIN in the last 168 hours. No results for input(s): LIPASE, AMYLASE in the last 168 hours. No results for input(s): AMMONIA in the last 168 hours. CBC: Recent Labs  Lab 12/22/19 1220 12/23/19 0339 12/24/19 0257 12/25/19 0110  WBC 3.7* 4.0 4.3 4.7  NEUTROABS 1.4*  --   --   --   HGB 14.1 14.9 15.2* 13.4  HCT 44.7 46.9* 47.5* 41.6  MCV 90.5 89.8 88.8 88.5  PLT 140* 159 145* 143*   Cardiac Enzymes: Recent Labs  Lab 12/22/19 1519  CKTOTAL 138   BNP: BNP (last 3 results) No results for input(s): BNP in the last 8760 hours.  ProBNP (last 3 results) No results for input(s): PROBNP in the last 8760 hours.  CBG: Recent Labs  Lab 12/22/19 1658  GLUCAP 93       Signed:  Alma Friendly, MD [facility] 12/26/2019, 12:16 PM

## 2019-12-26 NOTE — TOC Transition Note (Signed)
Transition of Care [facility]) - CM/SW Discharge Note   Patient Details  Name: FAATIMA TENCH MRN: [id number] Date of Birth: 1956/07/05  [facility] CM/SW Contact:  Ralph Dowdy, [place] Work Phone Number: 12/26/2019, 12:31 PM   Clinical Narrative:    Patient will DC to: [facility] date: 12/26/2019  Family notified: son  Transport by: Corey Harold        Per MD patient ready for DC to [facility]. RN, patient, patient's family, and facility notified of DC. Discharge Summary and FL2 sent to facility. RN to call report prior to discharge ([phone]). DC packet on chart. Ambulance transport requested for patient.     CSW will sign off for now as social work intervention is no longer needed. Please consult [place] again if new needs arise.    Final next level of care: Memory Care Barriers to Discharge: Continued Medical Work up   Patient Goals and CMS Choice Patient states their goals for this hospitalization and ongoing recovery are:: Return to [facility].gov Compare Post Acute Care list provided to:: Patient Represenative (must comment)(Son) Choice offered to / list presented to : Adult Children, Patient  Discharge Placement                       Discharge Plan and Services In-house Referral: Clinical Social Work   Post Acute Care Choice: (Memory care)                               Social Determinants of Health (SDOH) Interventions     Readmission Risk Interventions Readmission Risk Prevention Plan 12/25/2019  Transportation Screening Complete  PCP or Specialist Appt within 5-7 Days Complete  Home Care Screening Complete  Medication Review (RN CM) Complete  Some recent data might be hidden

## 2019-12-26 NOTE — TOC Progression Note (Signed)
Transition of Care [facility]) - Progression Note    Patient Details  Name: [name] MRN: [id number] Date of Birth: 07-18-1956  Transition of Care [facility]) CM/SW [place], LCSW Phone Number: 12/26/2019, 12:56 PM  Clinical Narrative:    [facility] able to accept patient today. [name] requesting PTAR for transport.    Expected Discharge Plan: Memory Care Barriers to Discharge: Barriers Resolved  Expected Discharge Plan and Services Expected Discharge Plan: Memory Care In-house Referral: Clinical Social Work   Post Acute Care Choice: (Memory care) Living arrangements for the past 2 months: [place] Expected Discharge Date: 12/26/19                                     Social Determinants of Health (SDOH) Interventions    Readmission Risk Interventions Readmission Risk Prevention Plan 12/25/2019  Transportation Screening Complete  PCP or Specialist Appt within 5-7 Days Complete  Home Care Screening Complete  Medication Review (RN CM) Complete  Some recent data might be hidden

## 2019-12-26 NOTE — Progress Notes (Signed)
Physical Therapy Treatment Patient Details Name: [name] MRN: [id number] DOB: November 22, 1955 Today's Date: 12/26/2019    History of Present Illness Pt is a 64 y/o female admitted from ALF secondary to syncopal episode; likely orthostatic hypotension. PMH includes dementia, parkinson's disease, HTN, dCHF, CAD, s/p ICD, and s/p AVR.     PT Comments    Patient received in bed. Pleasant, agrees to PT session. Patient performed supine to sit with mod independence ( use of bed rails), transfers with min assist, ambulated 200 feet with RW and min guard. Occasional cues needed for safety with RW use, especially during turning. Patient required assist to position self back in bed.  She will continue to benefit from skilled PT while here to improve safety with mobility.    Follow Up Recommendations  SNF     Equipment Recommendations  None recommended by PT;Other (comment)(can be determined at next location)    Recommendations for Other Services       Precautions / Restrictions Precautions Precautions: Fall Restrictions Weight Bearing Restrictions: No    Mobility  Bed Mobility Overal bed mobility: Needs Assistance Bed Mobility: Supine to Sit;Sit to Supine     Supine to sit: Modified independent (Device/Increase time) Sit to supine: Min assist   General bed mobility comments: min assist returning to bed to get positioned well.  Transfers Overall transfer level: Needs assistance Equipment used: Rolling walker (2 wheeled) Transfers: Sit to/from Stand Sit to Stand: Min assist            Ambulation/Gait Ambulation/Gait assistance: Min guard Gait Distance (Feet): 200 Feet Assistive device: Rolling walker (2 wheeled) Gait Pattern/deviations: Decreased stride length;Shuffle;Step-through pattern Gait velocity: decr   General Gait Details: improved step length when using walker, requires cues to stay within walker and close to it.   Stairs             Wheelchair  Mobility    Modified Rankin (Stroke Patients Only)       Balance Overall balance assessment: Needs assistance Sitting-balance support: Feet supported Sitting balance-Leahy Scale: Good     Standing balance support: Bilateral upper extremity supported;During functional activity Standing balance-Leahy Scale: Good Standing balance comment: reliant on at least single UE support for balance and safety with mobility.                            Cognition Arousal/Alertness: Awake/alert Behavior During Therapy: WFL for tasks assessed/performed Overall Cognitive Status: No family/caregiver present to determine baseline cognitive functioning                                 General Comments: dementia at baseline. Confused regarding location, follows direction well.      Exercises      General Comments        Pertinent Vitals/Pain Pain Assessment: No/denies pain    Home Living                      Prior Function            PT Goals (current goals can now be found in the care plan section) Acute Rehab PT Goals Patient Stated Goal: to walk PT Goal Formulation: With patient Time For Goal Achievement: 01/06/20 Potential to Achieve Goals: Good Progress towards PT goals: Progressing toward goals    Frequency    Min 3X/week  PT Plan Current plan remains appropriate    Co-evaluation              AM-PAC PT "6 Clicks" Mobility   Outcome Measure  Help needed turning from your back to your side while in a flat bed without using bedrails?: A Little Help needed moving from lying on your back to sitting on the side of a flat bed without using bedrails?: A Little Help needed moving to and from a bed to a chair (including a wheelchair)?: A Little Help needed standing up from a chair using your arms (e.g., wheelchair or bedside chair)?: A Little Help needed to walk in hospital room?: A Little Help needed climbing 3-5 steps with a  railing? : A Little 6 Click Score: 18    End of Session Equipment Utilized During Treatment: Gait belt Activity Tolerance: Patient tolerated treatment well Patient left: in bed;with bed alarm set Nurse Communication: Mobility status PT Visit Diagnosis: Muscle weakness (generalized) (M62.81);History of falling (Z91.81);Other abnormalities of gait and mobility (R26.89);Difficulty in walking, not elsewhere classified (R26.2)     Time: 2956-2130 PT Time Calculation (min) (ACUTE ONLY): 29 min  Charges:  $Gait Training: 23-37 mins                     Sevin Farone, PT, GCS 12/26/19,11:55 AM

## 2019-12-26 NOTE — Final Progress Note (Signed)
PIV to be removed when PTAR arrives. AVS and report given to York Cerise at [facility], all questions and concerns answered. All belongings sent with patient. Awaiting PTAR for transportation.

## 2019-12-29 ENCOUNTER — Encounter (HOSPITAL_COMMUNITY): Payer: Self-pay | Admitting: Emergency Medicine

## 2019-12-29 ENCOUNTER — Telehealth: Payer: Self-pay | Admitting: Internal Medicine

## 2019-12-29 ENCOUNTER — Other Ambulatory Visit: Payer: Self-pay

## 2019-12-29 ENCOUNTER — Emergency Department (HOSPITAL_COMMUNITY): Payer: HMO

## 2019-12-29 ENCOUNTER — Inpatient Hospital Stay (HOSPITAL_COMMUNITY)
Admission: EM | Admit: 2019-12-29 | Discharge: 2020-01-02 | DRG: 522 | Disposition: A | Discharge status: Skilled Nursing Facility | Payer: HMO | Source: Skilled Nursing Facility | Attending: Internal Medicine | Admitting: Internal Medicine

## 2019-12-29 DIAGNOSIS — W19XXXA Unspecified fall, initial encounter: Secondary | ICD-10-CM | POA: Diagnosis not present

## 2019-12-29 DIAGNOSIS — F419 Anxiety disorder, unspecified: Secondary | ICD-10-CM | POA: Diagnosis present

## 2019-12-29 DIAGNOSIS — I428 Other cardiomyopathies: Secondary | ICD-10-CM | POA: Diagnosis present

## 2019-12-29 DIAGNOSIS — Z800 Family history of malignant neoplasm of digestive organs: Secondary | ICD-10-CM | POA: Diagnosis not present

## 2019-12-29 DIAGNOSIS — F0280 Dementia in other diseases classified elsewhere without behavioral disturbance: Secondary | ICD-10-CM | POA: Diagnosis present

## 2019-12-29 DIAGNOSIS — I951 Orthostatic hypotension: Secondary | ICD-10-CM | POA: Diagnosis present

## 2019-12-29 DIAGNOSIS — Z95810 Presence of automatic (implantable) cardiac defibrillator: Secondary | ICD-10-CM

## 2019-12-29 DIAGNOSIS — S72002A Fracture of unspecified part of neck of left femur, initial encounter for closed fracture: Secondary | ICD-10-CM | POA: Diagnosis present

## 2019-12-29 DIAGNOSIS — F329 Major depressive disorder, single episode, unspecified: Secondary | ICD-10-CM | POA: Diagnosis present

## 2019-12-29 DIAGNOSIS — Z86010 Personal history of colonic polyps: Secondary | ICD-10-CM

## 2019-12-29 DIAGNOSIS — M25552 Pain in left hip: Secondary | ICD-10-CM | POA: Diagnosis not present

## 2019-12-29 DIAGNOSIS — M25562 Pain in left knee: Secondary | ICD-10-CM | POA: Diagnosis not present

## 2019-12-29 DIAGNOSIS — D696 Thrombocytopenia, unspecified: Secondary | ICD-10-CM | POA: Diagnosis present

## 2019-12-29 DIAGNOSIS — Z953 Presence of xenogenic heart valve: Secondary | ICD-10-CM

## 2019-12-29 DIAGNOSIS — I110 Hypertensive heart disease with heart failure: Secondary | ICD-10-CM | POA: Diagnosis present

## 2019-12-29 DIAGNOSIS — M2550 Pain in unspecified joint: Secondary | ICD-10-CM | POA: Diagnosis not present

## 2019-12-29 DIAGNOSIS — R296 Repeated falls: Secondary | ICD-10-CM | POA: Diagnosis present

## 2019-12-29 DIAGNOSIS — S72002D Fracture of unspecified part of neck of left femur, subsequent encounter for closed fracture with routine healing: Secondary | ICD-10-CM | POA: Diagnosis not present

## 2019-12-29 DIAGNOSIS — I5032 Chronic diastolic (congestive) heart failure: Secondary | ICD-10-CM | POA: Diagnosis present

## 2019-12-29 DIAGNOSIS — G20 Parkinson's disease: Secondary | ICD-10-CM | POA: Diagnosis present

## 2019-12-29 DIAGNOSIS — Z471 Aftercare following joint replacement surgery: Secondary | ICD-10-CM | POA: Diagnosis not present

## 2019-12-29 DIAGNOSIS — Z4789 Encounter for other orthopedic aftercare: Secondary | ICD-10-CM | POA: Diagnosis not present

## 2019-12-29 DIAGNOSIS — Z79899 Other long term (current) drug therapy: Secondary | ICD-10-CM

## 2019-12-29 DIAGNOSIS — Z8249 Family history of ischemic heart disease and other diseases of the circulatory system: Secondary | ICD-10-CM

## 2019-12-29 DIAGNOSIS — S72012A Unspecified intracapsular fracture of left femur, initial encounter for closed fracture: Principal | ICD-10-CM | POA: Diagnosis present

## 2019-12-29 DIAGNOSIS — Z9049 Acquired absence of other specified parts of digestive tract: Secondary | ICD-10-CM | POA: Diagnosis not present

## 2019-12-29 DIAGNOSIS — W1830XA Fall on same level, unspecified, initial encounter: Secondary | ICD-10-CM | POA: Diagnosis present

## 2019-12-29 DIAGNOSIS — Z20822 Contact with and (suspected) exposure to covid-19: Secondary | ICD-10-CM | POA: Diagnosis present

## 2019-12-29 DIAGNOSIS — F17210 Nicotine dependence, cigarettes, uncomplicated: Secondary | ICD-10-CM | POA: Diagnosis present

## 2019-12-29 DIAGNOSIS — Z96642 Presence of left artificial hip joint: Secondary | ICD-10-CM | POA: Diagnosis not present

## 2019-12-29 DIAGNOSIS — I509 Heart failure, unspecified: Secondary | ICD-10-CM | POA: Diagnosis not present

## 2019-12-29 DIAGNOSIS — R531 Weakness: Secondary | ICD-10-CM | POA: Diagnosis not present

## 2019-12-29 DIAGNOSIS — Z7401 Bed confinement status: Secondary | ICD-10-CM | POA: Diagnosis not present

## 2019-12-29 DIAGNOSIS — Z419 Encounter for procedure for purposes other than remedying health state, unspecified: Secondary | ICD-10-CM

## 2019-12-29 DIAGNOSIS — I255 Ischemic cardiomyopathy: Secondary | ICD-10-CM | POA: Diagnosis not present

## 2019-12-29 DIAGNOSIS — I2510 Atherosclerotic heart disease of native coronary artery without angina pectoris: Secondary | ICD-10-CM | POA: Diagnosis present

## 2019-12-29 DIAGNOSIS — E785 Hyperlipidemia, unspecified: Secondary | ICD-10-CM | POA: Diagnosis present

## 2019-12-29 DIAGNOSIS — M79605 Pain in left leg: Secondary | ICD-10-CM | POA: Diagnosis not present

## 2019-12-29 DIAGNOSIS — G20A1 Parkinson's disease without dyskinesia, without mention of fluctuations: Secondary | ICD-10-CM | POA: Diagnosis present

## 2019-12-29 DIAGNOSIS — W19XXXD Unspecified fall, subsequent encounter: Secondary | ICD-10-CM | POA: Diagnosis not present

## 2019-12-29 DIAGNOSIS — I350 Nonrheumatic aortic (valve) stenosis: Secondary | ICD-10-CM | POA: Diagnosis not present

## 2019-12-29 DIAGNOSIS — S72009A Fracture of unspecified part of neck of unspecified femur, initial encounter for closed fracture: Secondary | ICD-10-CM

## 2019-12-29 DIAGNOSIS — I10 Essential (primary) hypertension: Secondary | ICD-10-CM | POA: Diagnosis present

## 2019-12-29 DIAGNOSIS — Z03818 Encounter for observation for suspected exposure to other biological agents ruled out: Secondary | ICD-10-CM | POA: Diagnosis not present

## 2019-12-29 DIAGNOSIS — N179 Acute kidney failure, unspecified: Secondary | ICD-10-CM | POA: Diagnosis present

## 2019-12-29 DIAGNOSIS — Z7289 Other problems related to lifestyle: Secondary | ICD-10-CM

## 2019-12-29 DIAGNOSIS — D751 Secondary polycythemia: Secondary | ICD-10-CM | POA: Diagnosis present

## 2019-12-29 DIAGNOSIS — S299XXA Unspecified injury of thorax, initial encounter: Secondary | ICD-10-CM | POA: Diagnosis not present

## 2019-12-29 DIAGNOSIS — Z888 Allergy status to other drugs, medicaments and biological substances status: Secondary | ICD-10-CM

## 2019-12-29 DIAGNOSIS — S199XXA Unspecified injury of neck, initial encounter: Secondary | ICD-10-CM | POA: Diagnosis not present

## 2019-12-29 DIAGNOSIS — R52 Pain, unspecified: Secondary | ICD-10-CM | POA: Diagnosis not present

## 2019-12-29 DIAGNOSIS — Z560 Unemployment, unspecified: Secondary | ICD-10-CM

## 2019-12-29 DIAGNOSIS — F0390 Unspecified dementia without behavioral disturbance: Secondary | ICD-10-CM | POA: Diagnosis not present

## 2019-12-29 DIAGNOSIS — S8992XA Unspecified injury of left lower leg, initial encounter: Secondary | ICD-10-CM | POA: Diagnosis not present

## 2019-12-29 DIAGNOSIS — F29 Unspecified psychosis not due to a substance or known physiological condition: Secondary | ICD-10-CM | POA: Diagnosis not present

## 2019-12-29 DIAGNOSIS — Z7982 Long term (current) use of aspirin: Secondary | ICD-10-CM

## 2019-12-29 DIAGNOSIS — R42 Dizziness and giddiness: Secondary | ICD-10-CM | POA: Diagnosis not present

## 2019-12-29 DIAGNOSIS — S0990XA Unspecified injury of head, initial encounter: Secondary | ICD-10-CM | POA: Diagnosis not present

## 2019-12-29 LAB — CBC WITH DIFFERENTIAL/PLATELET
Abs Immature Granulocytes: 0.03 10*3/uL (ref 0.00–0.07)
Basophils Absolute: 0 10*3/uL (ref 0.0–0.1)
Basophils Relative: 0 %
Eosinophils Absolute: 0 10*3/uL (ref 0.0–0.5)
Eosinophils Relative: 1 %
HCT: 46.5 % — ABNORMAL HIGH (ref 36.0–46.0)
Hemoglobin: 15.2 g/dL — ABNORMAL HIGH (ref 12.0–15.0)
Immature Granulocytes: 0 %
Lymphocytes Relative: 15 %
Lymphs Abs: 1.2 10*3/uL (ref 0.7–4.0)
MCH: 29 pg (ref 26.0–34.0)
MCHC: 32.7 g/dL (ref 30.0–36.0)
MCV: 88.7 fL (ref 80.0–100.0)
Monocytes Absolute: 0.7 10*3/uL (ref 0.1–1.0)
Monocytes Relative: 9 %
Neutro Abs: 6.1 10*3/uL (ref 1.7–7.7)
Neutrophils Relative %: 75 %
Platelets: 132 10*3/uL — ABNORMAL LOW (ref 150–400)
RBC: 5.24 MIL/uL — ABNORMAL HIGH (ref 3.87–5.11)
RDW: 15.4 % (ref 11.5–15.5)
WBC: 8 10*3/uL (ref 4.0–10.5)
nRBC: 0 % (ref 0.0–0.2)

## 2019-12-29 LAB — BASIC METABOLIC PANEL
Anion gap: 7 (ref 5–15)
BUN: 21 mg/dL (ref 8–23)
CO2: 24 mmol/L (ref 22–32)
Calcium: 10.5 mg/dL — ABNORMAL HIGH (ref 8.9–10.3)
Chloride: 108 mmol/L (ref 98–111)
Creatinine, Ser: 1.12 mg/dL — ABNORMAL HIGH (ref 0.44–1.00)
GFR calc Af Amer: 60 mL/min — ABNORMAL LOW (ref >60)
GFR calc non Af Amer: 52 mL/min — ABNORMAL LOW (ref >60)
Glucose, Bld: 124 mg/dL — ABNORMAL HIGH (ref 70–99)
Potassium: 4.1 mmol/L (ref 3.5–5.1)
Sodium: 139 mmol/L (ref 135–145)

## 2019-12-29 MED ORDER — FENTANYL CITRATE (PF) 100 MCG/2ML IJ SOLN
50.0000 ug | INTRAMUSCULAR | Status: AC | PRN
  Administered 2019-12-29 – 2019-12-30 (×2): 50 ug via INTRAVENOUS
  Filled 2019-12-29 (×2): qty 2

## 2019-12-29 MED ORDER — SODIUM CHLORIDE 0.9 % IV BOLUS
500.0000 mL | Freq: Once | INTRAVENOUS | Status: AC
  Administered 2019-12-29: 500 mL via INTRAVENOUS

## 2019-12-29 NOTE — Patient Outreach (Signed)
  [facility]) Care Management Chronic Special Needs Program    12/29/2019  Name: [name], DOB: 1955/10/31  MRN: [id number]   [name] is enrolled in a chronic special needs plan for Heart Failure. Client admitted on 12/22/19 with syncope.  Discharged to [facility] memory care.  Reviewed and updated individualized care plan. Transition of care to be completed by [facility] general discharged.   PLAN; Individualized care plan updated and sent to client and primary care provider. RNCM will continue to follow and collaborated / care coordinated as needed.  Follow up with client within 1 month.  [name] RN,BSN,CCM [facility] Care Management [phone]

## 2019-12-29 NOTE — Telephone Encounter (Signed)
New message  [name] (Tourist information centre manager) from [facility] is calling in to make the office aware that patient does not have a monitor to send transmissions with. Please call and advise about what needs to happen.

## 2019-12-29 NOTE — ED Triage Notes (Signed)
Patient presents from [facility] due to multiple falls all day today. The facility reports possible syncope. She complains of weakness and pain in the left leg. She normally walks with a walker.   EMS vitals: 136/100 BP 105 HR 96% O2 sat on room air 138 CBg 97.9 Temp

## 2019-12-29 NOTE — ED Provider Notes (Signed)
And frequent falls [facility] DEPT Provider Note   CSN: [id number] Arrival date & time: 12/29/19  2057     History Chief Complaint  Patient presents with  . Weakness  . Fall  . Leg Pain    [name] is a 64 y.o. female.  She is presenting by ambulance from [place] due to falls multiple and left hip and leg pain.  She said she is fallen probably 4 times this week and twice today.  She normally walks with a walker.  When I asked her why she falls she says she is clumsy and does not pay attention.  Complaining of left hip pain worse with movement.  She denies loss of consciousness and did not hit her head or neck.  She is currently in a c-collar.  No chest pain abdominal pain vomiting diarrhea or urinary symptoms.  The history is provided by the patient.  Weakness Severity:  Moderate Onset quality:  Gradual Timing:  Intermittent Progression:  Unchanged Chronicity:  New Relieved by:  Nothing Worsened by:  Nothing Ineffective treatments:  None tried Associated symptoms: difficulty walking   Associated symptoms: no abdominal pain, no chest pain, no cough, no diarrhea, no dysuria, no numbness in extremities, no fever, no foul-smelling urine, no headaches, no shortness of breath and no vomiting   Fall This is a recurrent problem. Episode onset: today x2. The problem occurs every several days. The problem has not changed since onset.Pertinent negatives include no chest pain, no abdominal pain, no headaches and no shortness of breath.  Leg Pain Location:  Hip Injury: yes   Mechanism of injury: fall   Hip location:  L hip Pain details:    Radiates to:  L leg   Severity:  Moderate   Onset quality:  Sudden   Timing:  Constant   Progression:  Unchanged Chronicity:  New Relieved by:  Nothing Exacerbated by: movement. Associated symptoms: decreased ROM   Associated symptoms: no back pain, no fever, no muscle weakness, no neck pain, no numbness  and no tingling        Past Medical History:  Diagnosis Date  . Abnormal liver function tests   . AICD (automatic cardioverter/defibrillator) present    [employer]  . Anxiety   . Aortic stenosis   . Benign neoplasm of colon 04/19/2001   Hyperplastic  . Bicuspid aortic valve   . Cardiomyopathy secondary   . CHF (congestive heart failure) ([place])   . Coronary artery disease   . Depression   . Headache   . Heart murmur   . HTN (hypertension)   . Hypercalcemia   . Hyperlipidemia   . Memory changes 06/2018   in the last year /per the brother  . S/P ICD (internal cardiac defibrillator) procedure 2018    Patient Active Problem List   Diagnosis Date Noted  . Orthostatic hypotension 12/23/2019  . Syncopal episodes 12/22/2019  . Dementia due to Parkinson's disease without behavioral disturbance ([place]) 12/22/2019  . Renal insufficiency 12/22/2019  . Near syncope 09/04/2019  . AKI (acute kidney injury) ([place]) 09/04/2019  . Hypokalemia 09/04/2019  . Depression 09/04/2019  . Dementia ([place]) 09/04/2019  . Thrombocytopenia ([place]) 09/04/2019  . Low back pain 06/25/2019  . S/P AVR (aortic valve replacement) 12/12/2016  . Aortic stenosis 11/16/2016  . Severe aortic stenosis   . Polycythemia 06/06/2016  . Menopausal state 02/16/2016  . Hyperparathyroidism, primary ([place]) 02/16/2016  . Chronic systolic heart failure ([place]) 05/07/2014  .  Hx of colonic polyps 06/17/2013  . Ventricular tachycardia ([place]) 02/21/2011  . Automatic implantable cardioverter-defibrillator in situ 06/02/2009  . Hyperlipidemia 05/29/2009  . SMOKER 05/29/2009  . Essential hypertension 05/29/2009  . Aortic valve disorder 05/29/2009  . CARDIOMYOPATHY, SECONDARY 05/29/2009  . CHF 05/29/2009  . BICUSPID AORTIC VALVE 05/29/2009  . LIVER FUNCTION TESTS, ABNORMAL, HX OF 05/29/2009  . CHOLECYSTITIS, HX OF 05/29/2009    Past Surgical History:  Procedure Laterality Date  . AORTIC VALVE REPLACEMENT N/A  11/16/2016   Procedure: AORTIC VALVE REPLACEMENT (AVR);  Surgeon: Gaye Pollack, MD;  Location: [facility];  Service: Open Heart Surgery;  Laterality: N/A;  29mm Perimount Magna Ease Pericardial Bioprosthesis  . BREAST EXCISIONAL BIOPSY Left 1984   benign  . CARDIAC CATHETERIZATION N/A 07/05/2016   Procedure: Right/Left Heart Cath and Coronary Angiography;  Surgeon: Burnell Blanks, MD;  Location: [facility];  Service: Cardiovascular;  Laterality: N/A;  . CHOLECYSTECTOMY    . implantation of a Guidant biventricular ICD    . laparoscopic cholecystectomy with intraoperative cholangiogram  2003  . TEE WITHOUT CARDIOVERSION N/A 11/16/2016   Procedure: TRANSESOPHAGEAL ECHOCARDIOGRAM (TEE);  Surgeon: Gaye Pollack, MD;  Location: [facility];  Service: Open Heart Surgery;  Laterality: N/A;     OB History   No obstetric history on file.     Family History  Problem Relation Age of Onset  . Pancreatic cancer Mother   . Colon cancer Maternal Aunt   . Bone cancer Brother   . Heart attack Brother   . Hypercalcemia Neg Hx     Social History   Tobacco Use  . Smoking status: Current Every Day Smoker    Packs/day: 0.25    Years: 40.00    Pack years: 10.00    Types: Cigarettes  . Smokeless tobacco: Never Used  . Tobacco comment: client reports she smokes 1 cigarette per day  Substance Use Topics  . Alcohol use: Yes    Alcohol/week: 0.0 standard drinks    Comment: occasional  . Drug use: No    Home Medications Prior to Admission medications   Medication Sig Start Date End Date Taking? Authorizing Provider  acetaminophen (TYLENOL) 325 MG tablet Take 325 mg by mouth every 6 (six) hours as needed for mild pain.   Yes [provider]  aspirin EC 81 MG tablet Take 1 tablet (81 mg total) daily by mouth. Patient taking differently: Take 81 mg by mouth daily as needed for moderate pain.  08/10/17  Yes Burnell Blanks, MD  carbidopa-levodopa (SINEMET IR) 25-100 MG tablet  Take 1 tablet twice a day at breakfast and dinner 02/28/19  Yes Cameron Sprang, MD  carvedilol (COREG) 12.5 MG tablet Take 1 tablet by mouth 2 (two) times a day. With food 12/16/18  Yes [provider]  donepezil (ARICEPT) 10 MG tablet Take 1/2 tablet daily for 2 weeks, then increase to 1 tablet daily Patient taking differently: Take 10 mg by mouth daily.  02/28/19  Yes Cameron Sprang, MD  furosemide (LASIX) 20 MG tablet Take 1 tablet by mouth daily. 01/08/19  Yes [provider]  guaifenesin (ROBITUSSIN) 100 MG/5ML syrup Take 200 mg by mouth 4 (four) times daily as needed for cough.   Yes [provider]  loperamide (IMODIUM) 2 MG capsule Take 2 mg by mouth as needed for diarrhea or loose stools.   Yes [provider]  magnesium hydroxide (MILK OF MAGNESIA) 400 MG/5ML suspension Take 30  mLs by mouth at bedtime as needed for mild constipation.   Yes [provider]  olmesartan (BENICAR) 20 MG tablet Take 20 mg by mouth daily. 10/04/19  Yes [provider]  Polyethyl Glycol-Propyl Glycol (SYSTANE OP) Place 1-2 drops into both eyes 3 (three) times daily as needed (for dry eyes.).   Yes [provider]  polyethylene glycol (MIRALAX / GLYCOLAX) packet Take 17 g by mouth daily. 04/05/18  Yes Domenic Moras, PA-C  rosuvastatin (CRESTOR) 10 MG tablet Take 1 tablet (10 mg total) by mouth daily. 05/01/11  Yes Evans Lance, MD  sertraline (ZOLOFT) 100 MG tablet Take 1 tablet (100 mg total) by mouth daily. Patient taking differently: Take 100 mg by mouth daily as needed (depression).  02/28/19  Yes Cameron Sprang, MD  Multiple Vitamin (MULTIVITAMIN WITH MINERALS) TABS tablet Take 1 tablet by mouth daily.     [provider]    Allergies    Trospium chloride er  Review of Systems   Review of Systems  Constitutional: Negative for fever.  HENT: Negative for sore throat.   Eyes: Negative for visual disturbance.  Respiratory: Negative for cough  and shortness of breath.   Cardiovascular: Negative for chest pain.  Gastrointestinal: Negative for abdominal pain, diarrhea and vomiting.  Genitourinary: Negative for dysuria.  Musculoskeletal: Positive for gait problem. Negative for back pain and neck pain.  Skin: Negative for rash.  Neurological: Positive for weakness. Negative for headaches.    Physical Exam Updated Vital Signs BP (!) 167/109   Pulse (!) 101   Temp 99.8 F (37.7 C) (Oral)   Resp (!) 21   SpO2 98%   Physical Exam Vitals and nursing note reviewed.  Constitutional:      General: She is not in acute distress.    Appearance: She is well-developed.  HENT:     Head: Normocephalic and atraumatic.  Eyes:     Conjunctiva/sclera: Conjunctivae normal.  Cardiovascular:     Rate and Rhythm: Normal rate and regular rhythm.     Heart sounds: No murmur.  Pulmonary:     Effort: Pulmonary effort is normal. No respiratory distress.     Breath sounds: Normal breath sounds.  Abdominal:     Palpations: Abdomen is soft.     Tenderness: There is no abdominal tenderness.  Musculoskeletal:        General: Tenderness and signs of injury present. No deformity.     Cervical back: Neck supple.     Right lower leg: No edema.     Left lower leg: No edema.     Comments: Nontender and full range of motion of her upper extremities bilaterally without any pain or limitations.  Full range of motion of her right lower extremity without any pain or limitation.  Nontender knee and ankle but does have pain with any movement of her left hip.  Distal neurovascular intact.  Skin:    General: Skin is warm and dry.  Neurological:     General: No focal deficit present.     Mental Status: She is alert.     Sensory: No sensory deficit.     Motor: No weakness.     ED Results / Procedures / Treatments   Labs (all labs ordered are listed, but only abnormal results are displayed) Labs Reviewed  BASIC METABOLIC PANEL - Abnormal; Notable for the  following components:      Result Value   Glucose, Bld 124 (*)    Creatinine,  Ser 1.12 (*)    Calcium 10.5 (*)    GFR calc non Af Amer 52 (*)    GFR calc Af Amer 60 (*)    All other components within normal limits  CBC WITH DIFFERENTIAL/PLATELET - Abnormal; Notable for the following components:   RBC 5.24 (*)    Hemoglobin 15.2 (*)    HCT 46.5 (*)    Platelets 132 (*)    All other components within normal limits  URINALYSIS, ROUTINE W REFLEX MICROSCOPIC - Abnormal; Notable for the following components:   Hgb urine dipstick SMALL (*)    Protein, ur >=300 (*)    Bacteria, UA RARE (*)    All other components within normal limits  CBC - Abnormal; Notable for the following components:   Platelets 117 (*)    All other components within normal limits  BASIC METABOLIC PANEL - Abnormal; Notable for the following components:   CO2 20 (*)    Glucose, Bld 117 (*)    All other components within normal limits  RESPIRATORY PANEL BY RT PCR (FLU A&B, COVID)  SURGICAL PCR SCREEN  PROTIME-INR  SODIUM, URINE, RANDOM  CREATININE, URINE, RANDOM  UREA NITROGEN, URINE  TYPE AND SCREEN  ABO/RH    EKG None  Radiology DG Chest 1 View  Result Date: 12/29/2019 CLINICAL DATA:  64 year old female with fall and hip pain. EXAM: CHEST  1 VIEW COMPARISON:  Chest radiograph dated 12/22/2019. FINDINGS: The lungs are clear. There is no pleural effusion or pneumothorax. The cardiac silhouette is within normal limits. Median sternotomy wires, left pectoral AICD device, and cardiac valve replacement. Atherosclerotic calcification of the aorta. No acute osseous pathology. Right upper quadrant cholecystectomy clips. IMPRESSION: No acute cardiopulmonary process. Electronically Signed   By: Anner Crete M.D.   On: 12/29/2019 22:41   CT HEAD WO CONTRAST  Result Date: 12/29/2019 CLINICAL DATA:  65 year old female with head trauma. EXAM: CT HEAD WITHOUT CONTRAST CT CERVICAL SPINE WITHOUT CONTRAST TECHNIQUE:  Multidetector CT imaging of the head and cervical spine was performed following the standard protocol without intravenous contrast. Multiplanar CT image reconstructions of the cervical spine were also generated. COMPARISON:  Head CT dated 12/22/2019. FINDINGS: CT HEAD FINDINGS Brain: Moderate age-related atrophy and chronic microvascular ischemic changes. There is no acute intracranial hemorrhage. No mass effect or midline shift. No extra-axial fluid collection. Vascular: No hyperdense vessel or unexpected calcification. Skull: Normal. Negative for fracture or focal lesion. Sinuses/Orbits: The visualized paranasal sinuses are clear. There is opacification of several mastoid air cells bilaterally. Other: None CT CERVICAL SPINE FINDINGS Alignment: No acute subluxation. Skull base and vertebrae: No acute fracture. Soft tissues and spinal canal: No prevertebral fluid or swelling. No visible canal hematoma. Disc levels:  Mild degenerative changes. Upper chest: Negative. Other: Bilateral carotid bulb calcified plaques. IMPRESSION: 1. No acute intracranial pathology. 2. No acute/traumatic cervical spine pathology. Electronically Signed   By: Anner Crete M.D.   On: 12/29/2019 22:26   CT CERVICAL SPINE WO CONTRAST  Result Date: 12/29/2019 CLINICAL DATA:  64 year old female with head trauma. EXAM: CT HEAD WITHOUT CONTRAST CT CERVICAL SPINE WITHOUT CONTRAST TECHNIQUE: Multidetector CT imaging of the head and cervical spine was performed following the standard protocol without intravenous contrast. Multiplanar CT image reconstructions of the cervical spine were also generated. COMPARISON:  Head CT dated 12/22/2019. FINDINGS: CT HEAD FINDINGS Brain: Moderate age-related atrophy and chronic microvascular ischemic changes. There is no acute intracranial hemorrhage. No mass effect or midline shift. No extra-axial fluid  collection. Vascular: No hyperdense vessel or unexpected calcification. Skull: Normal. Negative for  fracture or focal lesion. Sinuses/Orbits: The visualized paranasal sinuses are clear. There is opacification of several mastoid air cells bilaterally. Other: None CT CERVICAL SPINE FINDINGS Alignment: No acute subluxation. Skull base and vertebrae: No acute fracture. Soft tissues and spinal canal: No prevertebral fluid or swelling. No visible canal hematoma. Disc levels:  Mild degenerative changes. Upper chest: Negative. Other: Bilateral carotid bulb calcified plaques. IMPRESSION: 1. No acute intracranial pathology. 2. No acute/traumatic cervical spine pathology. Electronically Signed   By: Anner Crete M.D.   On: 12/29/2019 22:26   DG Hip Unilat With Pelvis 2-3 Views Left  Result Date: 12/29/2019 CLINICAL DATA:  Pain status post fall EXAM: DG HIP (WITH OR WITHOUT PELVIS) 2-3V LEFT COMPARISON:  None. FINDINGS: There is an acute displaced subcapital/transcervical fracture of the proximal left femur. There is no dislocation. The osseous mineralization is slightly decreased. IMPRESSION: Acute displaced subcapital/transcervical fracture of the proximal left femur. Electronically Signed   By: Constance Holster M.D.   On: 12/29/2019 22:39    Procedures Procedures (including critical care time)  Medications Ordered in ED Medications  fentaNYL (SUBLIMAZE) injection 50 mcg (50 mcg Intravenous Given 12/29/19 2259)  sodium chloride 0.9 % bolus 500 mL (500 mLs Intravenous New Bag/Given 12/29/19 2253)    ED Course  I have reviewed the triage vital signs and the nursing notes.  Pertinent labs & imaging results that were available during my care of the patient were reviewed by me and considered in my medical decision making (see chart for details).  Clinical Course as of Dec 29 1028  Mon Dec 29, 2019  2305 Updated patient with results of her x-ray.   [MB]  2316 Discussed with Dr. Lucia Gaskins from orthopedics [facility].  He recommended keeping the patient n.p.o. and she potentially may have a procedure tomorrow  morning.   [MB]  2319 EKG is sinus tachycardia nonspecific interventricular conduction delay probable old anterior MI no acute ST-T's.  Similar to prior EKGs.   [MB]  Tue Dec 30, 2019  0009 Discussed with Dr. Myna Hidalgo from [facility] hospitalist to evaluate the patient for admission.   [MB]    Clinical Course User Index [MB] Hayden Rasmussen, MD   MDM Rules/Calculators/A&P                     This patient complains of left hip pain and frequent falls; this involves an extensive number of treatment Options and is a complaint that carries with it a high risk of complications and Morbidity. The differential includes fracture, dislocation, anemia, metabolic derangement, CNS bleed, infection  I ordered, reviewed and interpreted labs, which included CBC showing a normal white count and stable hemoglobin. I ordered medication fentanyl and IV fluids with improvement in her pain and tachycardia. I ordered imaging studies which included CT of her head and cervical spine along with x-rays of her left hip femur, chest x-ray and I independently    visualized and interpreted imaging which showed no acute bleed and no cervical fracture.  She unfortunately did have a left subcapsular hip fracture. Previous records obtained and reviewed epic I consulted Dr. Lucia Gaskins orthopedics and discussed lab and imaging findings.  He recommended admitting the patient to a medical service and somebody from their group will see her in the morning for possible surgery tomorrow.  Keep n.p.o. after midnight.  After the interventions stated above, I reevaluated the patient and found pain  and tachycardia improved.  She is still quite hypertensive.  Awaiting the rest of her labs back so can talk to the hospitalist regarding admission.   Final Clinical Impression(s) / ED Diagnoses Final diagnoses:  Left displaced femoral neck fracture ([place])  Essential hypertension  Frequent falls    Rx / DC Orders ED Discharge Orders    None         Hayden Rasmussen, MD 12/30/19 1032

## 2019-12-29 NOTE — Telephone Encounter (Signed)
I spoke with [name] and got the address.   I e-mail [employer] rep to let him know the pt needs a new monitor.

## 2019-12-30 ENCOUNTER — Inpatient Hospital Stay (HOSPITAL_COMMUNITY): Payer: HMO

## 2019-12-30 ENCOUNTER — Encounter (HOSPITAL_COMMUNITY): Payer: Self-pay | Admitting: Family Medicine

## 2019-12-30 DIAGNOSIS — R296 Repeated falls: Secondary | ICD-10-CM | POA: Insufficient documentation

## 2019-12-30 DIAGNOSIS — Z7289 Other problems related to lifestyle: Secondary | ICD-10-CM | POA: Diagnosis not present

## 2019-12-30 DIAGNOSIS — S72012A Unspecified intracapsular fracture of left femur, initial encounter for closed fracture: Secondary | ICD-10-CM | POA: Diagnosis present

## 2019-12-30 DIAGNOSIS — E785 Hyperlipidemia, unspecified: Secondary | ICD-10-CM | POA: Diagnosis present

## 2019-12-30 DIAGNOSIS — I951 Orthostatic hypotension: Secondary | ICD-10-CM | POA: Diagnosis present

## 2019-12-30 DIAGNOSIS — Z800 Family history of malignant neoplasm of digestive organs: Secondary | ICD-10-CM | POA: Diagnosis not present

## 2019-12-30 DIAGNOSIS — I5032 Chronic diastolic (congestive) heart failure: Secondary | ICD-10-CM

## 2019-12-30 DIAGNOSIS — Z9049 Acquired absence of other specified parts of digestive tract: Secondary | ICD-10-CM | POA: Diagnosis not present

## 2019-12-30 DIAGNOSIS — F419 Anxiety disorder, unspecified: Secondary | ICD-10-CM | POA: Diagnosis present

## 2019-12-30 DIAGNOSIS — G20 Parkinson's disease: Secondary | ICD-10-CM

## 2019-12-30 DIAGNOSIS — Z20822 Contact with and (suspected) exposure to covid-19: Secondary | ICD-10-CM | POA: Diagnosis present

## 2019-12-30 DIAGNOSIS — S72002A Fracture of unspecified part of neck of left femur, initial encounter for closed fracture: Secondary | ICD-10-CM | POA: Diagnosis not present

## 2019-12-30 DIAGNOSIS — Z888 Allergy status to other drugs, medicaments and biological substances status: Secondary | ICD-10-CM | POA: Diagnosis not present

## 2019-12-30 DIAGNOSIS — Z95810 Presence of automatic (implantable) cardiac defibrillator: Secondary | ICD-10-CM | POA: Diagnosis not present

## 2019-12-30 DIAGNOSIS — D751 Secondary polycythemia: Secondary | ICD-10-CM | POA: Diagnosis present

## 2019-12-30 DIAGNOSIS — I110 Hypertensive heart disease with heart failure: Secondary | ICD-10-CM | POA: Diagnosis present

## 2019-12-30 DIAGNOSIS — F0280 Dementia in other diseases classified elsewhere without behavioral disturbance: Secondary | ICD-10-CM | POA: Diagnosis present

## 2019-12-30 DIAGNOSIS — F17210 Nicotine dependence, cigarettes, uncomplicated: Secondary | ICD-10-CM | POA: Diagnosis present

## 2019-12-30 DIAGNOSIS — D696 Thrombocytopenia, unspecified: Secondary | ICD-10-CM | POA: Diagnosis present

## 2019-12-30 DIAGNOSIS — I2510 Atherosclerotic heart disease of native coronary artery without angina pectoris: Secondary | ICD-10-CM | POA: Diagnosis present

## 2019-12-30 DIAGNOSIS — Z86010 Personal history of colonic polyps: Secondary | ICD-10-CM | POA: Diagnosis not present

## 2019-12-30 DIAGNOSIS — I428 Other cardiomyopathies: Secondary | ICD-10-CM | POA: Diagnosis present

## 2019-12-30 DIAGNOSIS — F329 Major depressive disorder, single episode, unspecified: Secondary | ICD-10-CM | POA: Diagnosis present

## 2019-12-30 DIAGNOSIS — W1830XA Fall on same level, unspecified, initial encounter: Secondary | ICD-10-CM | POA: Diagnosis present

## 2019-12-30 DIAGNOSIS — I10 Essential (primary) hypertension: Secondary | ICD-10-CM

## 2019-12-30 DIAGNOSIS — N179 Acute kidney failure, unspecified: Secondary | ICD-10-CM | POA: Diagnosis present

## 2019-12-30 DIAGNOSIS — Z953 Presence of xenogenic heart valve: Secondary | ICD-10-CM | POA: Diagnosis not present

## 2019-12-30 LAB — BASIC METABOLIC PANEL
Anion gap: 10 (ref 5–15)
BUN: 16 mg/dL (ref 8–23)
CO2: 20 mmol/L — ABNORMAL LOW (ref 22–32)
Calcium: 10.2 mg/dL (ref 8.9–10.3)
Chloride: 107 mmol/L (ref 98–111)
Creatinine, Ser: 0.69 mg/dL (ref 0.44–1.00)
GFR calc Af Amer: >60 mL/min (ref >60)
GFR calc non Af Amer: >60 mL/min (ref >60)
Glucose, Bld: 117 mg/dL — ABNORMAL HIGH (ref 70–99)
Potassium: 3.7 mmol/L (ref 3.5–5.1)
Sodium: 137 mmol/L (ref 135–145)

## 2019-12-30 LAB — PROTIME-INR
INR: 0.9 (ref 0.8–1.2)
Prothrombin Time: 12.4 seconds (ref 11.4–15.2)

## 2019-12-30 LAB — CBC
HCT: 41.8 % (ref 36.0–46.0)
Hemoglobin: 13.9 g/dL (ref 12.0–15.0)
MCH: 28.7 pg (ref 26.0–34.0)
MCHC: 33.3 g/dL (ref 30.0–36.0)
MCV: 86.2 fL (ref 80.0–100.0)
Platelets: 117 10*3/uL — ABNORMAL LOW (ref 150–400)
RBC: 4.85 MIL/uL (ref 3.87–5.11)
RDW: 15.1 % (ref 11.5–15.5)
WBC: 6 10*3/uL (ref 4.0–10.5)
nRBC: 0 % (ref 0.0–0.2)

## 2019-12-30 LAB — URINALYSIS, ROUTINE W REFLEX MICROSCOPIC
Bilirubin Urine: NEGATIVE
Glucose, UA: NEGATIVE mg/dL
Ketones, ur: NEGATIVE mg/dL
Leukocytes,Ua: NEGATIVE
Nitrite: NEGATIVE
Protein, ur: >=300 mg/dL — AB
Specific Gravity, Urine: 1.013 (ref 1.005–1.030)
pH: 8 (ref 5.0–8.0)

## 2019-12-30 LAB — SURGICAL PCR SCREEN
MRSA, PCR: NEGATIVE
Staphylococcus aureus: NEGATIVE

## 2019-12-30 LAB — CREATININE, URINE, RANDOM: Creatinine, Urine: 79.89 mg/dL

## 2019-12-30 LAB — RESPIRATORY PANEL BY RT PCR (FLU A&B, COVID)
Influenza A by PCR: NEGATIVE
Influenza B by PCR: NEGATIVE
SARS Coronavirus 2 by RT PCR: NEGATIVE

## 2019-12-30 LAB — TYPE AND SCREEN
ABO/RH(D): A POS
Antibody Screen: NEGATIVE

## 2019-12-30 LAB — ABO/RH: ABO/RH(D): A POS

## 2019-12-30 LAB — SODIUM, URINE, RANDOM: Sodium, Ur: 173 mmol/L

## 2019-12-30 MED ORDER — DEXTROSE 5 % IV SOLN: 3.0000 g | INTRAVENOUS | Status: DC

## 2019-12-30 MED ORDER — POVIDONE-IODINE 10 % EX SWAB: 2.0000 "application " | Freq: Once | CUTANEOUS | Status: DC

## 2019-12-30 MED ORDER — CHLORHEXIDINE GLUCONATE CLOTH 2 % EX PADS
6.0000 | MEDICATED_PAD | Freq: Every day | CUTANEOUS | Status: DC
  Administered 2019-12-30 – 2019-12-31 (×2): 6 via TOPICAL

## 2019-12-30 MED ORDER — BISACODYL 5 MG PO TBEC: 5.0000 mg | DELAYED_RELEASE_TABLET | Freq: Every day | ORAL | Status: DC | PRN

## 2019-12-30 MED ORDER — CHLORHEXIDINE GLUCONATE 4 % EX LIQD: 60.0000 mL | Freq: Once | CUTANEOUS | Status: DC

## 2019-12-30 MED ORDER — POLYETHYLENE GLYCOL 3350 17 G PO PACK: 17.0000 g | PACK | Freq: Every day | ORAL | Status: DC | PRN

## 2019-12-30 MED ORDER — LABETALOL HCL 5 MG/ML IV SOLN
10.0000 mg | INTRAVENOUS | Status: DC | PRN
  Administered 2019-12-30 – 2019-12-31 (×2): 10 mg via INTRAVENOUS
  Filled 2019-12-30 (×4): qty 4

## 2019-12-30 MED ORDER — TRANEXAMIC ACID-NACL 1000-0.7 MG/100ML-% IV SOLN
1000.0000 mg | INTRAVENOUS | Status: AC
  Administered 2019-12-31: 1000 mg via INTRAVENOUS
  Filled 2019-12-30: qty 100

## 2019-12-30 MED ORDER — ROSUVASTATIN CALCIUM 10 MG PO TABS
10.0000 mg | ORAL_TABLET | Freq: Every day | ORAL | Status: DC
  Administered 2019-12-30 – 2020-01-02 (×3): 10 mg via ORAL
  Filled 2019-12-30 (×3): qty 1

## 2019-12-30 MED ORDER — ENSURE PRE-SURGERY PO LIQD
296.0000 mL | Freq: Once | ORAL | Status: DC
  Filled 2019-12-30: qty 296

## 2019-12-30 MED ORDER — SODIUM CHLORIDE 0.9 % IV SOLN: INTRAVENOUS | Status: DC

## 2019-12-30 MED ORDER — MORPHINE SULFATE (PF) 2 MG/ML IV SOLN
1.0000 mg | INTRAVENOUS | Status: DC | PRN
  Administered 2019-12-30 (×4): 2 mg via INTRAVENOUS
  Filled 2019-12-30 (×4): qty 1

## 2019-12-30 MED ORDER — CARVEDILOL 12.5 MG PO TABS
12.5000 mg | ORAL_TABLET | Freq: Two times a day (BID) | ORAL | Status: DC
  Administered 2019-12-30 – 2020-01-02 (×8): 12.5 mg via ORAL
  Filled 2019-12-30 (×9): qty 1

## 2019-12-30 MED ORDER — HYDRALAZINE HCL 50 MG PO TABS
50.0000 mg | ORAL_TABLET | Freq: Three times a day (TID) | ORAL | Status: DC | PRN
  Administered 2019-12-30: 50 mg via ORAL
  Filled 2019-12-30: qty 5

## 2019-12-30 MED ORDER — CARBIDOPA-LEVODOPA 25-100 MG PO TABS
1.0000 | ORAL_TABLET | Freq: Two times a day (BID) | ORAL | Status: DC
  Administered 2019-12-30 – 2020-01-02 (×6): 1 via ORAL
  Filled 2019-12-30 (×6): qty 1

## 2019-12-30 MED ORDER — DONEPEZIL HCL 10 MG PO TABS
10.0000 mg | ORAL_TABLET | Freq: Every day | ORAL | Status: DC
  Administered 2019-12-30 – 2020-01-02 (×3): 10 mg via ORAL
  Filled 2019-12-30 (×3): qty 1

## 2019-12-30 MED ORDER — CEFAZOLIN SODIUM-DEXTROSE 2-4 GM/100ML-% IV SOLN
2.0000 g | INTRAVENOUS | Status: AC
  Administered 2019-12-31: 2 g via INTRAVENOUS
  Filled 2019-12-30: qty 100

## 2019-12-30 MED ORDER — ONDANSETRON HCL 4 MG/2ML IJ SOLN: 4.0000 mg | Freq: Four times a day (QID) | INTRAMUSCULAR | Status: DC | PRN

## 2019-12-30 NOTE — Progress Notes (Signed)
Orthopedic Tech Progress Note Patient Details:  [name] [birth year] [id number] Trapeze is to be put on once the patient can sit in chair while it is being put on. Spoke with nurse Raquel Sarna and she is going to have ortho paged once patient is out of that bed.  Patient ID: [name], female   DOB: [birth year], 64 y.o.   MRN: [id number]   Staci Righter 12/30/2019, 9:21 AM

## 2019-12-30 NOTE — ED Notes (Signed)
Called [facility] to give report, unavailable at this time. Left a message to return call to the ED to get report.

## 2019-12-30 NOTE — ED Notes (Signed)
Attempted to call report, unable to accept patient at this time due to hypertension

## 2019-12-30 NOTE — H&P (Signed)
History and Physical    [name] MAU:[id number] DOB: March 08, 1956 DOA: 12/29/2019  PCP: Lujean Amel, MD   Patient coming from: [facility]    Chief Complaint: Falls, left hip pain   HPI: [name] is a 64 y.o. female with medical history significant for Parkinson dementia, nonischemic cardiomyopathy with AICD, aortic stenosis status post bioprosthetic AVR, orthostatic hypotension, and hypertension, now presenting with left hip pain after multiple recent falls.  Patient was discharged from the hospital a few days ago after admission for orthostatic hypotension with recurrent falls, reports that she has had multiple falls since then, and has been experiencing severe pain involving the left hip.  She denies losing consciousness and she left the hospital, denies hitting her head with any of these falls, and denies any other pain.  Patient reports that she has been using a walker, believes that this is helped, denies feeling lightheaded or any other symptoms prior to the falls, but does not believe that she is tripping or able to identify any other cause for these falls.  She denies chest pain, shortness of breath, cough, dysuria, vomiting, or diarrhea.  ED Course: Upon arrival to the ED, patient is found to be afebrile, saturating well on room air, hypertensive to 190/110.  CT head is negative for acute intracranial abnormality and no acute cervical spine pathology is noted on CT.  Chest x-ray is negative for acute cardiopulmonary disease.  Radiographs of the left hip demonstrate acute displaced proximal femur fracture.  Chemistry panel notable for creatinine of 1.12, up from 0.72 three days earlier.  CBC with mild polycythemia and thrombocytopenia.  Orthopedic surgery was consulted by the ED physician and recommended medical admission.  Patient was treated with 500 cc normal saline and fentanyl in the ED.  COVID-19 screening test not yet resulted.  Review of Systems:  All other  systems reviewed and apart from HPI, are negative.  Past Medical History:  Diagnosis Date  . Abnormal liver function tests   . AICD (automatic cardioverter/defibrillator) present    [employer]  . Anxiety   . Aortic stenosis   . Benign neoplasm of colon 04/19/2001   Hyperplastic  . Bicuspid aortic valve   . Cardiomyopathy secondary   . CHF (congestive heart failure) ([place])   . Coronary artery disease   . Depression   . Headache   . Heart murmur   . HTN (hypertension)   . Hypercalcemia   . Hyperlipidemia   . Memory changes 06/2018   in the last year /per the brother  . S/P ICD (internal cardiac defibrillator) procedure 2018    Past Surgical History:  Procedure Laterality Date  . AORTIC VALVE REPLACEMENT N/A 11/16/2016   Procedure: AORTIC VALVE REPLACEMENT (AVR);  Surgeon: Gaye Pollack, MD;  Location: [facility];  Service: Open Heart Surgery;  Laterality: N/A;  90mm Perimount Magna Ease Pericardial Bioprosthesis  . BREAST EXCISIONAL BIOPSY Left 1984   benign  . CARDIAC CATHETERIZATION N/A 07/05/2016   Procedure: Right/Left Heart Cath and Coronary Angiography;  Surgeon: Burnell Blanks, MD;  Location: [facility];  Service: Cardiovascular;  Laterality: N/A;  . CHOLECYSTECTOMY    . implantation of a Guidant biventricular ICD    . laparoscopic cholecystectomy with intraoperative cholangiogram  2003  . TEE WITHOUT CARDIOVERSION N/A 11/16/2016   Procedure: TRANSESOPHAGEAL ECHOCARDIOGRAM (TEE);  Surgeon: Gaye Pollack, MD;  Location: [facility];  Service: Open Heart Surgery;  Laterality: N/A;  reports that she has been smoking cigarettes. She has a 10.00 pack-year smoking history. She has never used smokeless tobacco. She reports current alcohol use. She reports that she does not use drugs.  Allergies  Allergen Reactions  . Trospium Chloride Er Other (See Comments)    Constipation     Family History  Problem Relation Age of Onset  . Pancreatic cancer  Mother   . Colon cancer Maternal Aunt   . Bone cancer Brother   . Heart attack Brother   . Hypercalcemia Neg Hx      Prior to Admission medications   Medication Sig Start Date End Date Taking? Authorizing Provider  acetaminophen (TYLENOL) 325 MG tablet Take 325 mg by mouth every 6 (six) hours as needed for mild pain.   Yes [provider]  aspirin EC 81 MG tablet Take 1 tablet (81 mg total) daily by mouth. Patient taking differently: Take 81 mg by mouth daily as needed for moderate pain.  08/10/17  Yes Burnell Blanks, MD  carbidopa-levodopa (SINEMET IR) 25-100 MG tablet Take 1 tablet twice a day at breakfast and dinner 02/28/19  Yes Cameron Sprang, MD  carvedilol (COREG) 12.5 MG tablet Take 1 tablet by mouth 2 (two) times a day. With food 12/16/18  Yes [provider]  donepezil (ARICEPT) 10 MG tablet Take 1/2 tablet daily for 2 weeks, then increase to 1 tablet daily Patient taking differently: Take 10 mg by mouth daily.  02/28/19  Yes Cameron Sprang, MD  furosemide (LASIX) 20 MG tablet Take 1 tablet by mouth daily. 01/08/19  Yes [provider]  guaifenesin (ROBITUSSIN) 100 MG/5ML syrup Take 200 mg by mouth 4 (four) times daily as needed for cough.   Yes [provider]  loperamide (IMODIUM) 2 MG capsule Take 2 mg by mouth as needed for diarrhea or loose stools.   Yes [provider]  magnesium hydroxide (MILK OF MAGNESIA) 400 MG/5ML suspension Take 30 mLs by mouth at bedtime as needed for mild constipation.   Yes [provider]  olmesartan (BENICAR) 20 MG tablet Take 20 mg by mouth daily. 10/04/19  Yes [provider]  Polyethyl Glycol-Propyl Glycol (SYSTANE OP) Place 1-2 drops into both eyes 3 (three) times daily as needed (for dry eyes.).   Yes [provider]  polyethylene glycol (MIRALAX / GLYCOLAX) packet Take 17 g by mouth daily. 04/05/18  Yes Domenic Moras, PA-C  rosuvastatin (CRESTOR) 10 MG tablet Take 1 tablet  (10 mg total) by mouth daily. 05/01/11  Yes Evans Lance, MD  sertraline (ZOLOFT) 100 MG tablet Take 1 tablet (100 mg total) by mouth daily. Patient taking differently: Take 100 mg by mouth daily as needed (depression).  02/28/19  Yes Cameron Sprang, MD  Multiple Vitamin (MULTIVITAMIN WITH MINERALS) TABS tablet Take 1 tablet by mouth daily.     [provider]    Physical Exam: Vitals:   12/29/19 2200 12/29/19 2305 12/29/19 2315 12/29/19 2330  BP: (!) 161/120 (!) 189/112 (!) 190/108 (!) 191/112  Pulse:  93 91 93  Resp: (!) 22 16 20 18   Temp:      TempSrc:      SpO2:  97% 97% 97%     Constitutional: NAD, calm  Eyes: PERTLA, lids and conjunctivae normal ENMT: Mucous membranes are moist. Posterior pharynx clear of any exudate or lesions.   Neck: normal, supple, no masses, no thyromegaly Respiratory: no wheezing, no crackles. No accessory muscle use.  Cardiovascular: S1 & S2 heard, regular rate and rhythm. No extremity edema.   Abdomen: No distension, no tenderness, soft. Bowel sounds active.  Musculoskeletal: no clubbing / cyanosis. Left hip tender; neurovascularly intact distally.   Skin: no significant rashes, lesions, ulcers. Warm, dry, well-perfused. Neurologic: CN 2-12 grossly intact. Sensation intact. Moving all extremities.  Psychiatric: Alert and oriented to person, place, and situation. Blunted affect. Very pleasant and cooperative.    Labs and Imaging on Admission: I have personally reviewed following labs and imaging studies  CBC: Recent Labs  Lab 12/23/19 0339 12/24/19 0257 12/25/19 0110 12/29/19 2248  WBC 4.0 4.3 4.7 8.0  NEUTROABS  --   --   --  6.1  HGB 14.9 15.2* 13.4 15.2*  HCT 46.9* 47.5* 41.6 46.5*  MCV 89.8 88.8 88.5 88.7  PLT 159 145* 143* 696*   Basic Metabolic Panel: Recent Labs  Lab 12/23/19 0339 12/24/19 0257 12/25/19 0110 12/26/19 0336 12/29/19 2248  NA 139 134* 135 139 139  K 4.2 4.6 4.2 4.0 4.1  CL 105 105 104 110 108  CO2 23  18* 19* 19* 24  GLUCOSE 94 86 100* 91 124*  BUN 18 19 30* 19 21  CREATININE 0.84 0.89 1.29* 0.72 1.12*  CALCIUM 10.4* 10.0 10.2 9.8 10.5*   GFR: Estimated Creatinine Clearance: 43.2 mL/min (A) (by C-G formula based on SCr of 1.12 mg/dL (H)). Liver Function Tests: No results for input(s): AST, ALT, ALKPHOS, BILITOT, PROT, ALBUMIN in the last 168 hours. No results for input(s): LIPASE, AMYLASE in the last 168 hours. No results for input(s): AMMONIA in the last 168 hours. Coagulation Profile: Recent Labs  Lab 12/29/19 2248  INR 0.9   Cardiac Enzymes: No results for input(s): CKTOTAL, CKMB, CKMBINDEX, TROPONINI in the last 168 hours. BNP (last 3 results) No results for input(s): PROBNP in the last 8760 hours. HbA1C: No results for input(s): HGBA1C in the last 72 hours. CBG: No results for input(s): GLUCAP in the last 168 hours. Lipid Profile: No results for input(s): CHOL, HDL, LDLCALC, TRIG, CHOLHDL, LDLDIRECT in the last 72 hours. Thyroid Function Tests: No results for input(s): TSH, T4TOTAL, FREET4, T3FREE, THYROIDAB in the last 72 hours. Anemia Panel: No results for input(s): VITAMINB12, FOLATE, FERRITIN, TIBC, IRON, RETICCTPCT in the last 72 hours. Urine analysis:    Component Value Date/Time   COLORURINE YELLOW 12/22/2019 1233   APPEARANCEUR CLEAR 12/22/2019 1233   LABSPEC 1.017 12/22/2019 1233   PHURINE 6.0 12/22/2019 1233   GLUCOSEU NEGATIVE 12/22/2019 1233   HGBUR NEGATIVE 12/22/2019 1233   BILIRUBINUR NEGATIVE 12/22/2019 1233   KETONESUR NEGATIVE 12/22/2019 1233   PROTEINUR 100 (A) 12/22/2019 1233   NITRITE NEGATIVE 12/22/2019 1233   LEUKOCYTESUR NEGATIVE 12/22/2019 1233   Sepsis Labs: @LABRCNTIP (procalcitonin:4,lacticidven:4) ) Recent Results (from the past 240 hour(s))  SARS CORONAVIRUS 2 (TAT 6-24 HRS) Nasopharyngeal Nasopharyngeal Swab     Status: None   Collection Time: 12/22/19  2:54 PM   Specimen: Nasopharyngeal Swab  Result Value Ref Range Status    SARS Coronavirus 2 NEGATIVE NEGATIVE Final    Comment: (NOTE) SARS-CoV-2 target nucleic acids are NOT DETECTED. The SARS-CoV-2 RNA is generally detectable in upper and lower respiratory specimens during the acute phase of infection. Negative results do not preclude SARS-CoV-2 infection, do not rule out co-infections with other pathogens, and should not be used as the sole basis for treatment or other patient management decisions. Negative results must be combined with clinical observations, patient history, and epidemiological information.  The expected result is Negative. Fact Sheet for Patients: SugarRoll.be Fact Sheet for Healthcare Providers: https://www.woods-mathews.com/ This test is not yet approved or cleared by the [place] FDA and  has been authorized for detection and/or diagnosis of SARS-CoV-2 by FDA under an Emergency Use Authorization (EUA). This EUA will remain  in effect (meaning this test can be used) for the duration of the COVID-19 declaration under Section 56 4(b)(1) of the Act, 21 U.S.C. section 360bbb-3(b)(1), unless the authorization is terminated or revoked sooner. Performed at [facility], [place] [address], [place], [place] [address]      Radiological Exams on Admission: DG Chest 1 View  Result Date: 12/29/2019 CLINICAL DATA:  64 year old female with fall and hip pain. EXAM: CHEST  1 VIEW COMPARISON:  Chest radiograph dated 12/22/2019. FINDINGS: The lungs are clear. There is no pleural effusion or pneumothorax. The cardiac silhouette is within normal limits. Median sternotomy wires, left pectoral AICD device, and cardiac valve replacement. Atherosclerotic calcification of the aorta. No acute osseous pathology. Right upper quadrant cholecystectomy clips. IMPRESSION: No acute cardiopulmonary process. Electronically Signed   By: Anner Crete M.D.   On: 12/29/2019 22:41   CT HEAD WO CONTRAST  Result Date:  12/29/2019 CLINICAL DATA:  64 year old female with head trauma. EXAM: CT HEAD WITHOUT CONTRAST CT CERVICAL SPINE WITHOUT CONTRAST TECHNIQUE: Multidetector CT imaging of the head and cervical spine was performed following the standard protocol without intravenous contrast. Multiplanar CT image reconstructions of the cervical spine were also generated. COMPARISON:  Head CT dated 12/22/2019. FINDINGS: CT HEAD FINDINGS Brain: Moderate age-related atrophy and chronic microvascular ischemic changes. There is no acute intracranial hemorrhage. No mass effect or midline shift. No extra-axial fluid collection. Vascular: No hyperdense vessel or unexpected calcification. Skull: Normal. Negative for fracture or focal lesion. Sinuses/Orbits: The visualized paranasal sinuses are clear. There is opacification of several mastoid air cells bilaterally. Other: None CT CERVICAL SPINE FINDINGS Alignment: No acute subluxation. Skull base and vertebrae: No acute fracture. Soft tissues and spinal canal: No prevertebral fluid or swelling. No visible canal hematoma. Disc levels:  Mild degenerative changes. Upper chest: Negative. Other: Bilateral carotid bulb calcified plaques. IMPRESSION: 1. No acute intracranial pathology. 2. No acute/traumatic cervical spine pathology. Electronically Signed   By: Anner Crete M.D.   On: 12/29/2019 22:26   CT CERVICAL SPINE WO CONTRAST  Result Date: 12/29/2019 CLINICAL DATA:  64 year old female with head trauma. EXAM: CT HEAD WITHOUT CONTRAST CT CERVICAL SPINE WITHOUT CONTRAST TECHNIQUE: Multidetector CT imaging of the head and cervical spine was performed following the standard protocol without intravenous contrast. Multiplanar CT image reconstructions of the cervical spine were also generated. COMPARISON:  Head CT dated 12/22/2019. FINDINGS: CT HEAD FINDINGS Brain: Moderate age-related atrophy and chronic microvascular ischemic changes. There is no acute intracranial hemorrhage. No mass effect or  midline shift. No extra-axial fluid collection. Vascular: No hyperdense vessel or unexpected calcification. Skull: Normal. Negative for fracture or focal lesion. Sinuses/Orbits: The visualized paranasal sinuses are clear. There is opacification of several mastoid air cells bilaterally. Other: None CT CERVICAL SPINE FINDINGS Alignment: No acute subluxation. Skull base and vertebrae: No acute fracture. Soft tissues and spinal canal: No prevertebral fluid or swelling. No visible canal hematoma. Disc levels:  Mild degenerative changes. Upper chest: Negative. Other: Bilateral carotid bulb calcified plaques. IMPRESSION: 1. No acute intracranial pathology. 2. No acute/traumatic cervical spine pathology. Electronically Signed   By: Anner Crete M.D.   On: 12/29/2019 22:26   DG Hip  Unilat With Pelvis 2-3 Views Left  Result Date: 12/29/2019 CLINICAL DATA:  Pain status post fall EXAM: DG HIP (WITH OR WITHOUT PELVIS) 2-3V LEFT COMPARISON:  None. FINDINGS: There is an acute displaced subcapital/transcervical fracture of the proximal left femur. There is no dislocation. The osseous mineralization is slightly decreased. IMPRESSION: Acute displaced subcapital/transcervical fracture of the proximal left femur. Electronically Signed   By: Constance Holster M.D.   On: 12/29/2019 22:39     Assessment/Plan   1. Left hip fracture  - Presents with left hip after multiple falls and is found to have left hip fracture  - Orthopedic surgery is consulting and much appreciated  - Based on the available data, [name] presents an estimated 1.2% risk of perioperative MI or cardiac arrest  - Check EKG, continue pain-control, CMS checks, NPO, supportive care, hold ASA and ARB    2. Orthostatic hypotension; recurrent falls   - Patient continues to have recurrent syncopal & near-syncopal episodes and recurrent falls, has been admitted twice for this in the past few months, and has documented orthostatic hypotension and likely  autonomic dysfunction  - Continue fall precautions and compression stockings, avoid hypovolemia, consider abdominal binder, fludrocortisone    3. Hypertension  - Continue Coreg, hold ARB perioperatively    4. Chronic diastolic CHF  - Appears compensated  - Continue beta-blocker, hold ARB and Lasix initially    5. Parkinson dementia  - Continue Sinemet    6. AKI  - SCr is up 0.4 mg/dL over the past 3 days   - She appears hypovolemic and was given 500 cc NS in ED  - Hold Lasix and ARB, continue IVF hydration while NPO, check FEUrea, repeat chem panel in am    DVT prophylaxis: SCDs  Code Status: Full  Family Communication: Discussed with patient  Disposition Plan: Pending surgical clearance  Consults called: Orthopedic surgery  Admission status: Inpatient     Vianne Bulls, MD [facility] Pager: See [email or web address]  If 7AM-7PM, please contact the daytime attending [email or web address]  12/30/2019, 12:20 AM

## 2019-12-30 NOTE — Progress Notes (Signed)
[name] is a 64 y.o. female with a history of Parkinson's dementia, and ICM with AICD, AAS status post bioprosthetic AVR, orthostatic hypotension on Florinef, recurrent falls, hypertension with recent admission and discharge on 4/2 for syncope who was admitted early this morning by Dr. Myna Hidalgo for multiple falls since discharge and now with left hip pain found to have left hip acute displaced proximal femur fracture.  Orthopedic surgery was consulted.  Currently, patient's states that she feels "crazy "but cannot describe further.  Multiple nonspecific complaints.   PE: General: AAOx3 no acute distress with awkward speech  Heart: S1 and S2 auscultated, systolic murmur auscultated Lungs: Clear to auscultation bilaterally, no wheeze    A/P  1. Left hip acute displaced proximal femur fracture a. Orthopedic surgery consulted, appreciate recommendations b. No recommendations as of yet, will advance diet make n.p.o. after midnight for possible surgery  2. Orthostatic hypotension with recurrent falls a. Continue fall precautions, compression stockings, avoid hypovolemia and consider abdominal binder.  Continue home Florinef b. Consider discontinuing beta-blocker?  3. Hypertension on Coreg, holding ARB  4. Chronic HFpEF, compensated a. Continue beta-blocker for now, holding ARB and Lasix b. Discontinue IV fluids for now avoid volume overload c. Daily weights and I/O  5. Parkinson's dementia a. Continue Sinemet  6. Multiple nonspecific complaints unknown etiology a. Stated she felt "crazy "not sure what she means by this b. Has proptosis but TSH was unremarkable recently and no electrolyte abnormalities c. Continue to observe for now for any sudden changes d. Delirium precautions   7. AKI, resolved with IV fluids      Harold Hedge, DO [facility] Pager [phone]

## 2019-12-31 ENCOUNTER — Other Ambulatory Visit: Payer: Self-pay

## 2019-12-31 ENCOUNTER — Encounter (HOSPITAL_COMMUNITY): Payer: Self-pay | Admitting: Family Medicine

## 2019-12-31 ENCOUNTER — Inpatient Hospital Stay (HOSPITAL_COMMUNITY): Payer: HMO

## 2019-12-31 ENCOUNTER — Encounter (HOSPITAL_COMMUNITY)
Admission: EM | Disposition: A | Discharge status: Skilled Nursing Facility | Payer: Self-pay | Source: Skilled Nursing Facility | Attending: Internal Medicine

## 2019-12-31 ENCOUNTER — Inpatient Hospital Stay (HOSPITAL_COMMUNITY): Payer: HMO | Admitting: Certified Registered Nurse Anesthetist

## 2019-12-31 HISTORY — PX: TOTAL HIP ARTHROPLASTY: SHX124

## 2019-12-31 LAB — CBC
HCT: 39.9 % (ref 36.0–46.0)
Hemoglobin: 13 g/dL (ref 12.0–15.0)
MCH: 28.8 pg (ref 26.0–34.0)
MCHC: 32.6 g/dL (ref 30.0–36.0)
MCV: 88.5 fL (ref 80.0–100.0)
Platelets: 102 10*3/uL — ABNORMAL LOW (ref 150–400)
RBC: 4.51 MIL/uL (ref 3.87–5.11)
RDW: 15.4 % (ref 11.5–15.5)
WBC: 6.3 10*3/uL (ref 4.0–10.5)
nRBC: 0 % (ref 0.0–0.2)

## 2019-12-31 LAB — COMPREHENSIVE METABOLIC PANEL
ALT: 17 U/L (ref 0–44)
AST: 28 U/L (ref 15–41)
Albumin: 3.4 g/dL — ABNORMAL LOW (ref 3.5–5.0)
Alkaline Phosphatase: 73 U/L (ref 38–126)
Anion gap: 8 (ref 5–15)
BUN: 15 mg/dL (ref 8–23)
CO2: 20 mmol/L — ABNORMAL LOW (ref 22–32)
Calcium: 9.7 mg/dL (ref 8.9–10.3)
Chloride: 109 mmol/L (ref 98–111)
Creatinine, Ser: 0.64 mg/dL (ref 0.44–1.00)
GFR calc Af Amer: >60 mL/min (ref >60)
GFR calc non Af Amer: >60 mL/min (ref >60)
Glucose, Bld: 95 mg/dL (ref 70–99)
Potassium: 4.1 mmol/L (ref 3.5–5.1)
Sodium: 137 mmol/L (ref 135–145)
Total Bilirubin: 0.8 mg/dL (ref 0.3–1.2)
Total Protein: 6.7 g/dL (ref 6.5–8.1)

## 2019-12-31 LAB — MAGNESIUM: Magnesium: 1.8 mg/dL (ref 1.7–2.4)

## 2019-12-31 LAB — UREA NITROGEN, URINE: Urea Nitrogen, Ur: 497 mg/dL

## 2019-12-31 SURGERY — ARTHROPLASTY, HIP, TOTAL, ANTERIOR APPROACH
Anesthesia: General | Site: Hip | Laterality: Left

## 2019-12-31 MED ORDER — LACTATED RINGERS IV SOLN: INTRAVENOUS | Status: DC

## 2019-12-31 MED ORDER — LABETALOL HCL 5 MG/ML IV SOLN
5.0000 mg | Freq: Once | INTRAVENOUS | Status: AC
  Administered 2019-12-31: 2.5 mg via INTRAVENOUS

## 2019-12-31 MED ORDER — MIDAZOLAM HCL 2 MG/2ML IJ SOLN: 0.5000 mg | Freq: Once | INTRAMUSCULAR | Status: DC | PRN

## 2019-12-31 MED ORDER — LABETALOL HCL 5 MG/ML IV SOLN
INTRAVENOUS | Status: DC | PRN
  Administered 2019-12-31: 5 mg via INTRAVENOUS

## 2019-12-31 MED ORDER — PHENYLEPHRINE 40 MCG/ML (10ML) SYRINGE FOR IV PUSH (FOR BLOOD PRESSURE SUPPORT)
PREFILLED_SYRINGE | INTRAVENOUS | Status: AC
  Filled 2019-12-31: qty 10

## 2019-12-31 MED ORDER — DEXAMETHASONE SODIUM PHOSPHATE 10 MG/ML IJ SOLN
INTRAMUSCULAR | Status: AC
  Filled 2019-12-31: qty 1

## 2019-12-31 MED ORDER — LIDOCAINE 2% (20 MG/ML) 5 ML SYRINGE
INTRAMUSCULAR | Status: AC
  Filled 2019-12-31: qty 5

## 2019-12-31 MED ORDER — PHENYLEPHRINE 40 MCG/ML (10ML) SYRINGE FOR IV PUSH (FOR BLOOD PRESSURE SUPPORT)
PREFILLED_SYRINGE | INTRAVENOUS | Status: DC | PRN
  Administered 2019-12-31: 100 ug via INTRAVENOUS
  Administered 2019-12-31 (×2): 80 ug via INTRAVENOUS
  Administered 2019-12-31 (×2): 100 ug via INTRAVENOUS
  Administered 2019-12-31: 80 ug via INTRAVENOUS

## 2019-12-31 MED ORDER — ISOPROPYL ALCOHOL 70 % SOLN
Status: DC | PRN
  Administered 2019-12-31: 1 via TOPICAL

## 2019-12-31 MED ORDER — PHENOL 1.4 % MT LIQD: 1.0000 | OROMUCOSAL | Status: DC | PRN

## 2019-12-31 MED ORDER — CEFAZOLIN SODIUM-DEXTROSE 2-4 GM/100ML-% IV SOLN
2.0000 g | Freq: Four times a day (QID) | INTRAVENOUS | Status: AC
  Administered 2019-12-31 – 2020-01-01 (×2): 2 g via INTRAVENOUS
  Filled 2019-12-31 (×2): qty 100

## 2019-12-31 MED ORDER — HYDROMORPHONE HCL 1 MG/ML IJ SOLN
0.2500 mg | INTRAMUSCULAR | Status: DC | PRN
  Administered 2019-12-31 (×2): 0.25 mg via INTRAVENOUS

## 2019-12-31 MED ORDER — SUGAMMADEX SODIUM 200 MG/2ML IV SOLN
INTRAVENOUS | Status: DC | PRN
  Administered 2019-12-31: 120.2 mg via INTRAVENOUS

## 2019-12-31 MED ORDER — LABETALOL HCL 5 MG/ML IV SOLN
INTRAVENOUS | Status: AC
  Filled 2019-12-31: qty 4

## 2019-12-31 MED ORDER — METOCLOPRAMIDE HCL 5 MG/ML IJ SOLN: 5.0000 mg | Freq: Three times a day (TID) | INTRAMUSCULAR | Status: DC | PRN

## 2019-12-31 MED ORDER — PROPOFOL 10 MG/ML IV BOLUS
INTRAVENOUS | Status: AC
  Filled 2019-12-31: qty 20

## 2019-12-31 MED ORDER — SODIUM CHLORIDE (PF) 0.9 % IJ SOLN
INTRAMUSCULAR | Status: DC | PRN
  Administered 2019-12-31: 30 mL

## 2019-12-31 MED ORDER — MENTHOL 3 MG MT LOZG: 1.0000 | LOZENGE | OROMUCOSAL | Status: DC | PRN

## 2019-12-31 MED ORDER — FENTANYL CITRATE (PF) 100 MCG/2ML IJ SOLN
INTRAMUSCULAR | Status: DC | PRN
  Administered 2019-12-31 (×4): 50 ug via INTRAVENOUS

## 2019-12-31 MED ORDER — IRBESARTAN 75 MG PO TABS
75.0000 mg | ORAL_TABLET | Freq: Every day | ORAL | Status: DC
  Administered 2020-01-01 – 2020-01-02 (×2): 75 mg via ORAL
  Filled 2019-12-31 (×2): qty 1

## 2019-12-31 MED ORDER — ONDANSETRON HCL 4 MG/2ML IJ SOLN
INTRAMUSCULAR | Status: DC | PRN
  Administered 2019-12-31: 4 mg via INTRAVENOUS

## 2019-12-31 MED ORDER — ONDANSETRON HCL 4 MG PO TABS: 4.0000 mg | ORAL_TABLET | Freq: Four times a day (QID) | ORAL | Status: DC | PRN

## 2019-12-31 MED ORDER — SODIUM CHLORIDE (PF) 0.9 % IJ SOLN
INTRAMUSCULAR | Status: AC
  Filled 2019-12-31: qty 50

## 2019-12-31 MED ORDER — ASPIRIN EC 325 MG PO TBEC
325.0000 mg | DELAYED_RELEASE_TABLET | Freq: Every day | ORAL | Status: DC
  Administered 2020-01-01 – 2020-01-02 (×2): 325 mg via ORAL
  Filled 2019-12-31 (×2): qty 1

## 2019-12-31 MED ORDER — ONDANSETRON HCL 4 MG/2ML IJ SOLN
INTRAMUSCULAR | Status: AC
  Filled 2019-12-31: qty 2

## 2019-12-31 MED ORDER — HYDROMORPHONE HCL 1 MG/ML IJ SOLN
INTRAMUSCULAR | Status: AC
  Filled 2019-12-31: qty 1

## 2019-12-31 MED ORDER — ONDANSETRON HCL 4 MG/2ML IJ SOLN: 4.0000 mg | Freq: Four times a day (QID) | INTRAMUSCULAR | Status: DC | PRN

## 2019-12-31 MED ORDER — PHENYLEPHRINE HCL (PRESSORS) 10 MG/ML IV SOLN
INTRAVENOUS | Status: AC
  Filled 2019-12-31: qty 1

## 2019-12-31 MED ORDER — FENTANYL CITRATE (PF) 100 MCG/2ML IJ SOLN
INTRAMUSCULAR | Status: AC
  Filled 2019-12-31: qty 2

## 2019-12-31 MED ORDER — TRAMADOL HCL 50 MG PO TABS
50.0000 mg | ORAL_TABLET | Freq: Four times a day (QID) | ORAL | Status: DC | PRN
  Administered 2019-12-31 – 2020-01-02 (×4): 50 mg via ORAL
  Filled 2019-12-31 (×4): qty 1

## 2019-12-31 MED ORDER — DEXAMETHASONE SODIUM PHOSPHATE 4 MG/ML IJ SOLN
INTRAMUSCULAR | Status: DC | PRN
  Administered 2019-12-31: 5 mg via INTRAVENOUS

## 2019-12-31 MED ORDER — KETOROLAC TROMETHAMINE 30 MG/ML IJ SOLN
INTRAMUSCULAR | Status: AC
  Filled 2019-12-31: qty 1

## 2019-12-31 MED ORDER — ROCURONIUM BROMIDE 10 MG/ML (PF) SYRINGE
PREFILLED_SYRINGE | INTRAVENOUS | Status: AC
  Filled 2019-12-31: qty 10

## 2019-12-31 MED ORDER — BUPIVACAINE HCL (PF) 0.25 % IJ SOLN
INTRAMUSCULAR | Status: DC | PRN
  Administered 2019-12-31: 30 mL

## 2019-12-31 MED ORDER — BUPIVACAINE HCL (PF) 0.25 % IJ SOLN
INTRAMUSCULAR | Status: AC
  Filled 2019-12-31: qty 30

## 2019-12-31 MED ORDER — KETOROLAC TROMETHAMINE 30 MG/ML IJ SOLN
INTRAMUSCULAR | Status: DC | PRN
  Administered 2019-12-31: 30 mg

## 2019-12-31 MED ORDER — SODIUM CHLORIDE 0.9 % IR SOLN
Status: DC | PRN
  Administered 2019-12-31: 4000 mL

## 2019-12-31 MED ORDER — PROPOFOL 10 MG/ML IV BOLUS
INTRAVENOUS | Status: DC | PRN
  Administered 2019-12-31: 60 mg via INTRAVENOUS
  Administered 2019-12-31: 30 mg via INTRAVENOUS

## 2019-12-31 MED ORDER — METOCLOPRAMIDE HCL 5 MG PO TABS: 5.0000 mg | ORAL_TABLET | Freq: Three times a day (TID) | ORAL | Status: DC | PRN

## 2019-12-31 MED ORDER — DOCUSATE SODIUM 100 MG PO CAPS
100.0000 mg | ORAL_CAPSULE | Freq: Two times a day (BID) | ORAL | Status: DC
  Administered 2019-12-31 – 2020-01-02 (×4): 100 mg via ORAL
  Filled 2019-12-31 (×4): qty 1

## 2019-12-31 MED ORDER — ROCURONIUM BROMIDE 10 MG/ML (PF) SYRINGE
PREFILLED_SYRINGE | INTRAVENOUS | Status: DC | PRN
  Administered 2019-12-31: 60 mg via INTRAVENOUS

## 2019-12-31 MED ORDER — MEPERIDINE HCL 50 MG/ML IJ SOLN: 6.2500 mg | INTRAMUSCULAR | Status: DC | PRN

## 2019-12-31 MED ORDER — PROMETHAZINE HCL 25 MG/ML IJ SOLN: 6.2500 mg | INTRAMUSCULAR | Status: DC | PRN

## 2019-12-31 SURGICAL SUPPLY — 57 items
BAG DECANTER FOR FLEXI CONT (MISCELLANEOUS) IMPLANT
BAG ZIPLOCK 12X15 (MISCELLANEOUS) IMPLANT
BLADE SURG SZ10 CARB STEEL (BLADE) ×1 IMPLANT
CHLORAPREP W/TINT 26 (MISCELLANEOUS) ×2 IMPLANT
COVER PERINEAL POST (MISCELLANEOUS) ×2 IMPLANT
COVER SURGICAL LIGHT HANDLE (MISCELLANEOUS) ×2 IMPLANT
COVER WAND RF STERILE (DRAPES) IMPLANT
CUP SECTOR GRIPTON 50MM (Cup) ×1 IMPLANT
DECANTER SPIKE VIAL GLASS SM (MISCELLANEOUS) ×2 IMPLANT
DERMABOND ADVANCED (GAUZE/BANDAGES/DRESSINGS) ×1
DERMABOND ADVANCED .7 DNX12 (GAUZE/BANDAGES/DRESSINGS) ×2 IMPLANT
DRAPE IMP U-DRAPE 54X76 (DRAPES) ×2 IMPLANT
DRAPE SHEET LG 3/4 BI-LAMINATE (DRAPES) ×6 IMPLANT
DRAPE STERI IOBAN 125X83 (DRAPES) IMPLANT
DRAPE U-SHAPE 47X51 STRL (DRAPES) ×4 IMPLANT
DRSG AQUACEL AG ADV 3.5X10 (GAUZE/BANDAGES/DRESSINGS) ×2 IMPLANT
ELECT REM PT RETURN 15FT ADLT (MISCELLANEOUS) ×2 IMPLANT
GAUZE SPONGE 4X4 12PLY STRL (GAUZE/BANDAGES/DRESSINGS) ×2 IMPLANT
GLOVE BIO SURGEON STRL SZ8.5 (GLOVE) ×4 IMPLANT
GLOVE BIOGEL PI IND STRL 8.5 (GLOVE) ×1 IMPLANT
GLOVE BIOGEL PI INDICATOR 8.5 (GLOVE) ×1
GOWN SPEC L3 XXLG W/TWL (GOWN DISPOSABLE) ×2 IMPLANT
HANDPIECE INTERPULSE COAX TIP (DISPOSABLE) ×2
HEAD FEMORAL 32 CERAMIC (Hips) ×1 IMPLANT
HOLDER FOLEY CATH W/STRAP (MISCELLANEOUS) ×2 IMPLANT
HOOD PEEL AWAY FLYTE STAYCOOL (MISCELLANEOUS) ×8 IMPLANT
JET LAVAGE IRRISEPT WOUND (IRRIGATION / IRRIGATOR) ×2
KIT TURNOVER KIT A (KITS) ×1 IMPLANT
LAVAGE JET IRRISEPT WOUND (IRRIGATION / IRRIGATOR) ×1 IMPLANT
LINER ACET PNNCL PLUS4 NEUTRAL (Hips) IMPLANT
MANIFOLD NEPTUNE II (INSTRUMENTS) ×2 IMPLANT
MARKER SKIN DUAL TIP RULER LAB (MISCELLANEOUS) ×2 IMPLANT
NDL SAFETY ECLIPSE 18X1.5 (NEEDLE) ×1 IMPLANT
NDL SPNL 18GX3.5 QUINCKE PK (NEEDLE) ×1 IMPLANT
NEEDLE HYPO 18GX1.5 SHARP (NEEDLE) ×2
NEEDLE SPNL 18GX3.5 QUINCKE PK (NEEDLE) ×2 IMPLANT
PACK ANTERIOR HIP CUSTOM (KITS) ×2 IMPLANT
PENCIL SMOKE EVACUATOR (MISCELLANEOUS) ×1 IMPLANT
PINNACLE PLUS 4 NEUTRAL (Hips) ×2 IMPLANT
SAW OSC TIP CART 19.5X105X1.3 (SAW) ×2 IMPLANT
SEALER BIPOLAR AQUA 6.0 (INSTRUMENTS) ×2 IMPLANT
SET HNDPC FAN SPRY TIP SCT (DISPOSABLE) ×1 IMPLANT
STEM TRI LOC BPS SZ4 W GRIPTON (Hips) IMPLANT
SUT ETHIBOND NAB CT1 #1 30IN (SUTURE) ×4 IMPLANT
SUT MNCRL AB 3-0 PS2 18 (SUTURE) ×2 IMPLANT
SUT MNCRL AB 4-0 PS2 18 (SUTURE) ×2 IMPLANT
SUT MON AB 2-0 CT1 36 (SUTURE) ×4 IMPLANT
SUT STRATAFIX PDO 1 14 VIOLET (SUTURE) ×2
SUT STRATFX PDO 1 14 VIOLET (SUTURE) ×1
SUT VIC AB 2-0 CT1 27 (SUTURE) ×2
SUT VIC AB 2-0 CT1 TAPERPNT 27 (SUTURE) ×1 IMPLANT
SUTURE STRATFX PDO 1 14 VIOLET (SUTURE) ×1 IMPLANT
SYR 3ML LL SCALE MARK (SYRINGE) ×2 IMPLANT
TRAY FOLEY MTR SLVR 16FR STAT (SET/KITS/TRAYS/PACK) IMPLANT
TRI LOC BPS SZ 4 W GRIPTON (Hips) ×2 IMPLANT
WATER STERILE IRR 1000ML POUR (IV SOLUTION) ×2 IMPLANT
YANKAUER SUCT BULB TIP 10FT TU (MISCELLANEOUS) ×2 IMPLANT

## 2019-12-31 NOTE — Op Note (Signed)
OPERATIVE REPORT  SURGEON: Rod Can, MD   ASSISTANT: Sherlean Foot, RNFA  PREOPERATIVE DIAGNOSIS: Displaced Left femoral neck fracture.   POSTOPERATIVE DIAGNOSIS: Displaced Left femoral neck fracture.   PROCEDURE: Left total hip arthroplasty, anterior approach.   IMPLANTS: DePuy Tri Lock stem, size 4, hi offset. DePuy Pinnacle Cup, size 50 mm. DePuy Altrx liner, size 32 by 50 mm, +4 neutral. DePuy Biolox ceramic head ball, size 32 + 1 mm.  ANESTHESIA:  General  ANTIBIOTICS: 2g ancef.  ESTIMATED BLOOD LOSS:-200 mL    DRAINS: None.  COMPLICATIONS: None   CONDITION: PACU - hemodynamically stable.   BRIEF CLINICAL NOTE: [name] is a 64 y.o. female with past medical history of dementia and frequent falls who presented with a displaced Left femoral neck fracture. The patient was admitted to the hospitalist service and underwent perioperative risk stratification and medical optimization. The risks, benefits, and alternatives to total hip arthroplasty were explained, and the patient elected to proceed.  PROCEDURE IN DETAIL: The patient was taken to the operating room and general anesthesia was induced on the hospital bed.  The patient was then positioned on the Hana table.  All bony prominences were well padded.  The hip was prepped and draped in the normal sterile surgical fashion.  A time-out was called verifying side and site of surgery. Antibiotics were given within 60 minutes of beginning the procedure.  The direct anterior approach to the hip was performed through the Hueter interval.  Lateral femoral circumflex vessels were treated with the Auqumantys. The anterior capsule was exposed and an inverted T capsulotomy was made.  Fracture hematoma was encountered and evacuated. The patient was found to have a comminuted Left subcapital femoral neck fracture.  I freshened the femoral neck cut with a saw.  I removed the femoral neck fragment.  A corkscrew was placed into  the head and the head was removed.  This was passed to the back table and was measured.   Acetabular exposure was achieved, and the pulvinar and labrum were excised. Sequential reaming of the acetabulum was then performed up to a size 49 mm reamer. A 50 mm cup was then opened and impacted into place at approximately 40 degrees of abduction and 20 degrees of anteversion. The final polyethylene liner was impacted into place.    I then gained femoral exposure taking care to protect the abductors and greater trochanter.  This was performed using standard external rotation, extension, and adduction.  The capsule was peeled off the inner aspect of the greater trochanter, taking care to preserve the short external rotators. A cookie cutter was used to enter the femoral canal, and then the femoral canal finder was used to confirm location.  I then sequentially broached up to a size 4.  Calcar planer was used on the femoral neck remnant.  I paced a hi neck and a trial head ball. The hip was reduced.  Leg lengths were checked fluoroscopically.  The hip was dislocated and trial components were removed.  I placed the real stem followed by the real spacer and head ball.  The hip was reduced.  Fluoroscopy was used to confirm component position and leg lengths.  At 90 degrees of external rotation and extension, the hip was stable to an anterior directed force. Lengthening of a few millimeters was required to restore adequate soft tissue tension.   The wound was copiously irrigated with Irrisept solution and normal saline using pule lavage.  Marcaine solution was injected into  the periarticular soft tissue.  The wound was closed in layers using #1 Vicryl and V-Loc for the fascia, 2-0 Vicryl for the subcutaneous fat, 2-0 Monocryl for the deep dermal layer, 3-0 running Monocryl subcuticular stitch and glue for the skin.  Once the glue was fully dried, an Aquacell Ag dressing was applied.  The patient was then awakened from  anesthesia and transported to the recovery room in stable condition.  Sponge, needle, and instrument counts were correct at the end of the case x2.  The patient tolerated the procedure well and there were no known complications.

## 2019-12-31 NOTE — Anesthesia Preprocedure Evaluation (Addendum)
Anesthesia Evaluation  Patient identified by MRN, date of birth, ID band Patient awake    Reviewed: Allergy & Precautions, NPO status , Patient's Chart, lab work & pertinent test results, reviewed documented beta blocker date and time   History of Anesthesia Complications Negative for: history of anesthetic complications  Airway Mallampati: II  TM Distance: >3 FB Neck ROM: Full    Dental  (+) Edentulous Upper, Edentulous Lower   Pulmonary Current Smoker and Patient abstained from smoking.,  12/22/2019 SARS coronavirus NEG   breath sounds clear to auscultation       Cardiovascular hypertension, Pt. on medications and Pt. on home beta blockers (-) angina+ Cardiac Defibrillator (biventricular) + Valvular Problems/Murmurs (s/p AVR (2018) for bicuspid AV) AS  Rhythm:Regular Rate:Normal  08/2019 ECHO: EF 55-60% with normal LVF, mod reduced RVF, mild TR, bioprosthetic AV with mild AS   Neuro/Psych PSYCHIATRIC DISORDERS (memory issues sometimes, recent falls) Anxiety Depression Dementia Parkinson's Recent syncope    GI/Hepatic negative GI ROS, Neg liver ROS,   Endo/Other  negative endocrine ROS  Renal/GU negative Renal ROS     Musculoskeletal   Abdominal   Peds  Hematology Hb 13, plt 102k   Anesthesia Other Findings   Reproductive/Obstetrics                            Anesthesia Physical Anesthesia Plan  ASA: III  Anesthesia Plan: General   Post-op Pain Management:    Induction: Intravenous  PONV Risk Score and Plan: 2 and Ondansetron, Dexamethasone and Treatment may vary due to age or medical condition  Airway Management Planned: Oral ETT  Additional Equipment:   Intra-op Plan:   Post-operative Plan: Extubation in OR  Informed Consent: I have reviewed the patients History and Physical, chart, labs and discussed the procedure including the risks, benefits and alternatives for the  proposed anesthesia with the patient or authorized representative who has indicated his/her understanding and acceptance.     Dental advisory given and Consent reviewed with POA  Plan Discussed with: CRNA and Surgeon  Anesthesia Plan Comments: (Discussed with pt's son, [name], by telephone)       Anesthesia Quick Evaluation

## 2019-12-31 NOTE — Discharge Instructions (Signed)
°Dr. Washington Whedbee °Joint Replacement Specialist °[facility] °[address] °[place], [place] [address] °[phone] ° ° °TOTAL HIP REPLACEMENT POSTOPERATIVE DIRECTIONS ° ° ° °Hip Rehabilitation, Guidelines Following Surgery  ° °WEIGHT BEARING °Weight bearing as tolerated with assist device (walker, cane, etc) as directed, use it as long as suggested by your surgeon or therapist, typically at least 4-6 weeks. ° °The results of a hip operation are greatly improved after range of motion and muscle strengthening exercises. Follow all safety measures which are given to protect your hip. If any of these exercises cause increased pain or swelling in your joint, decrease the amount until you are comfortable again. Then slowly increase the exercises. Call your caregiver if you have problems or questions.  ° °HOME CARE INSTRUCTIONS  °Most of the following instructions are designed to prevent the dislocation of your new hip.  °Remove items at home which could result in a fall. This includes throw rugs or furniture in walking pathways.  °Continue medications as instructed at time of discharge. °· You may have some home medications which will be placed on hold until you complete the course of blood thinner medication. °· You may start showering once you are discharged home. Do not remove your dressing. °Do not put on socks or shoes without following the instructions of your caregivers.   °Sit on chairs with arms. Use the chair arms to help push yourself up when arising.  °Arrange for the use of a toilet seat elevator so you are not sitting low.  °· Walk with walker as instructed.  °You may resume a sexual relationship in one month or when given the OK by your caregiver.  °Use walker as long as suggested by your caregivers.  °You may put full weight on your legs and walk as much as is comfortable. °Avoid periods of inactivity such as sitting longer than an hour when not asleep. This helps prevent  blood clots.  °You may return to work once you are cleared by your surgeon.  °Do not drive a car for 6 weeks or until released by your surgeon.  °Do not drive while taking narcotics.  °Wear elastic stockings for two weeks following surgery during the day but you may remove then at night.  °Make sure you keep all of your appointments after your operation with all of your doctors and caregivers. You should call the office at the above phone number and make an appointment for approximately two weeks after the date of your surgery. °Please pick up a stool softener and laxative for home use as long as you are requiring pain medications. °· ICE to the affected hip every three hours for 30 minutes at a time and then as needed for pain and swelling. Continue to use ice on the hip for pain and swelling from surgery. You may notice swelling that will progress down to the foot and ankle.  This is normal after surgery.  Elevate the leg when you are not up walking on it.   °It is important for you to complete the blood thinner medication as prescribed by your doctor. °· Continue to use the breathing machine which will help keep your temperature down.  It is common for your temperature to cycle up and down following surgery, especially at night when you are not up moving around and exerting yourself.  The breathing machine keeps your lungs expanded and your temperature down. ° °RANGE OF MOTION AND STRENGTHENING EXERCISES  °These exercises are   designed to help you keep full movement of your hip joint. Follow your caregiver's or physical therapist's instructions. Perform all exercises about fifteen times, three times per day or as directed. Exercise both hips, even if you have had only one joint replacement. These exercises can be done on a training (exercise) mat, on the floor, on a table or on a bed. Use whatever works the best and is most comfortable for you. Use music or television while you are exercising so that the exercises  are a pleasant break in your day. This will make your life better with the exercises acting as a break in routine you can look forward to.  °Lying on your back, slowly slide your foot toward your buttocks, raising your knee up off the floor. Then slowly slide your foot back down until your leg is straight again.  °Lying on your back spread your legs as far apart as you can without causing discomfort.  °Lying on your side, raise your upper leg and foot straight up from the floor as far as is comfortable. Slowly lower the leg and repeat.  °Lying on your back, tighten up the muscle in the front of your thigh (quadriceps muscles). You can do this by keeping your leg straight and trying to raise your heel off the floor. This helps strengthen the largest muscle supporting your knee.  °Lying on your back, tighten up the muscles of your buttocks both with the legs straight and with the knee bent at a comfortable angle while keeping your heel on the floor.  ° °SKILLED REHAB INSTRUCTIONS: °If the patient is transferred to a skilled rehab facility following release from the hospital, a list of the current medications will be sent to the facility for the patient to continue.  When discharged from the skilled rehab facility, please have the facility set up the patient's Home Health Physical Therapy prior to being released. Also, the skilled facility will be responsible for providing the patient with their medications at time of release from the facility to include their pain medication and their blood thinner medication. If the patient is still at the rehab facility at time of the two week follow up appointment, the skilled rehab facility will also need to assist the patient in arranging follow up appointment in our office and any transportation needs. ° °MAKE SURE YOU:  °Understand these instructions.  °Will watch your condition.  °Will get help right away if you are not doing well or get worse. ° °Pick up stool softner and  laxative for home use following surgery while on pain medications. °Do not remove your dressing. °The dressing is waterproof--it is OK to take showers. °Continue to use ice for pain and swelling after surgery. °Do not use any lotions or creams on the incision until instructed by your surgeon. °Total Hip Protocol. ° ° °

## 2019-12-31 NOTE — Anesthesia Procedure Notes (Signed)
Procedure Name: Intubation Date/Time: 12/31/2019 5:05 PM Performed by: Claudia Desanctis, CRNA Pre-anesthesia Checklist: Patient identified, Emergency Drugs available, Suction available and Patient being monitored Patient Re-evaluated:Patient Re-evaluated prior to induction Oxygen Delivery Method: Circle system utilized Preoxygenation: Pre-oxygenation with 100% oxygen Induction Type: IV induction Ventilation: Mask ventilation without difficulty Laryngoscope Size: 2 and Miller Grade View: Grade I Tube type: Oral Tube size: 7.0 mm Number of attempts: 1 Airway Equipment and Method: Stylet Placement Confirmation: ETT inserted through vocal cords under direct vision,  positive ETCO2 and breath sounds checked- equal and bilateral Tube secured with: Tape Dental Injury: Teeth and Oropharynx as per pre-operative assessment

## 2019-12-31 NOTE — Progress Notes (Signed)
PT Cancellation Note  Patient Details Name: [name] MRN: [id number] DOB: 04-04-1956   Cancelled Treatment:    Reason Eval/Treat Not Completed: Patient not medically ready. Per chart review and RN, pt going for surgical intervention of L hip fracture later today/tomorrow.    Talbot Grumbling PT, DPT 12/31/19, 10:56 AM [phone]

## 2019-12-31 NOTE — Progress Notes (Signed)
Initial Nutrition Assessment  DOCUMENTATION CODES:   Not applicable  INTERVENTION:  Monitor for diet advancement s/p procedure and provide nutrition supplements as appropriate   NUTRITION DIAGNOSIS:   Increased nutrient needs related to post-op healing as evidenced by estimated needs.    GOAL:   Patient will meet greater than or equal to 90% of their needs    MONITOR:   Diet advancement, Weight trends, Labs, I & O's, PO intake, Supplement acceptance, Skin  REASON FOR ASSESSMENT:   Consult Hip fracture protocol  ASSESSMENT:  64 year old female with past medical history of Parkinson's dementia, nonischemic cardiomyopathy with AICD, aortic stenosis s/p bioprosthetic AVR, orthostatic hypotension, HTN, recently discharged after admission for orthostatic hypothension with recurrent falls presented with complaints of severe left hip pain after multiple recent falls since her last discharge. X-ray of hip revealed acute displaced proximal femur fracture  Patient is currently NPO for planned L total hip arthroplasty. Unable to obtain nutrition history at this time, pt is off floor for procedure. Will monitor for diet advancement and provide supplements as appropriate.   Current wt 132.22 lbs Weight history reviewed, stable over the past 1.5 years  Medications and labs reviewed   NUTRITION - FOCUSED PHYSICAL EXAM: Unable to complete at this time   Diet Order:   Diet Order            Diet NPO time specified  Diet effective midnight              EDUCATION NEEDS:   No education needs have been identified at this time  Skin:  Skin Assessment: Reviewed RN Assessment  Last BM:     Height:   Ht Readings from Last 1 Encounters:  12/31/19 5\' 4"  (1.626 m)    Weight:   Wt Readings from Last 1 Encounters:  12/31/19 60.1 kg    Ideal Body Weight:     BMI:  Body mass index is 22.74 kg/m.  Estimated Nutritional Needs:   Kcal:  1600-1800  Protein:   80-90  Fluid:  >/= 1.6 L/day    Lajuan Lines, RD, LDN Clinical Nutrition After Hours/Weekend Pager # in [place]

## 2019-12-31 NOTE — Patient Outreach (Signed)
  [facility]) Care Management Chronic Special Needs Program  12/31/2019  Name: [name] DOB: [birth year]  MRN: [id number]  [name] is enrolled in a chronic special needs plan for  Heart Failure. RNCM received notification that client admitted to [facility] on 12/30/19 due to fall.  Total Hip arthroplasty on 12/31/19. Individualized care plan sent to [facility] utilization management department.   PLAN; RNCM will notify [facility] care management hospital liaison of clients admission and request follow up for discharge disposition.   [name] RN,BSN,CCM [facility] Care Management [phone]

## 2019-12-31 NOTE — Consult Note (Signed)
ORTHOPAEDIC CONSULTATION  REQUESTING PHYSICIAN: Arrien, Jimmy Picket,*  PCP:  Lujean Amel, MD  Chief Complaint: Left hip injury  HPI: [name] is a 64 y.o. female with a past medical history significant for Parkinson's disease, dementia, nonischemic cardiomyopathy with AICD, aortic stenosis status post bioprosthetic AVR, orthostatic hypotension, hypertension who normally resides at [facility] care.  Patient states that she has been sustaining multiple falls recently.  Just prior to this admission, she fell and injured her left hip.  She had left hip pain and inability to weight-bear.  She was brought to the emergency department at [facility], where x-rays revealed a displaced left femoral neck fracture.  She was admitted to the hospitalist service for perioperative medical risk ratification.  Orthopedic consultation was placed for management of her left hip fracture.  Past Medical History:  Diagnosis Date  . Abnormal liver function tests   . AICD (automatic cardioverter/defibrillator) present    [employer]  . Anxiety   . Aortic stenosis   . Benign neoplasm of colon 04/19/2001   Hyperplastic  . Bicuspid aortic valve   . Cardiomyopathy secondary   . CHF (congestive heart failure) ([place])   . Coronary artery disease   . Depression   . Headache   . Heart murmur   . HTN (hypertension)   . Hypercalcemia   . Hyperlipidemia   . Memory changes 06/2018   in the last year /per the brother  . S/P ICD (internal cardiac defibrillator) procedure 2018   Past Surgical History:  Procedure Laterality Date  . AORTIC VALVE REPLACEMENT N/A 11/16/2016   Procedure: AORTIC VALVE REPLACEMENT (AVR);  Surgeon: Gaye Pollack, MD;  Location: [facility];  Service: Open Heart Surgery;  Laterality: N/A;  66mm Perimount Magna Ease Pericardial Bioprosthesis  . BREAST EXCISIONAL BIOPSY Left 1984   benign  . CARDIAC CATHETERIZATION N/A 07/05/2016   Procedure:  Right/Left Heart Cath and Coronary Angiography;  Surgeon: Burnell Blanks, MD;  Location: [facility];  Service: Cardiovascular;  Laterality: N/A;  . CHOLECYSTECTOMY    . implantation of a Guidant biventricular ICD    . laparoscopic cholecystectomy with intraoperative cholangiogram  2003  . TEE WITHOUT CARDIOVERSION N/A 11/16/2016   Procedure: TRANSESOPHAGEAL ECHOCARDIOGRAM (TEE);  Surgeon: Gaye Pollack, MD;  Location: [facility];  Service: Open Heart Surgery;  Laterality: N/A;   Social History   Socioeconomic History  . Marital status: Single    Spouse name: Not on file  . Number of children: 1  . Years of education: Not on file  . Highest education level: Not on file  Occupational History  . Occupation: Geophysicist/field seismologist: UNEMPLOYED  Tobacco Use  . Smoking status: Current Every Day Smoker    Packs/day: 0.25    Years: 40.00    Pack years: 10.00    Types: Cigarettes  . Smokeless tobacco: Never Used  . Tobacco comment: client reports she smokes 1 cigarette per day  Substance and Sexual Activity  . Alcohol use: Yes    Alcohol/week: 0.0 standard drinks    Comment: occasional  . Drug use: No  . Sexual activity: Not on file  Other Topics Concern  . Not on file  Social History Narrative  . Not on file   Social Determinants of Health   Financial Resource Strain: Low Risk   . Difficulty of Paying Living Expenses: Not hard at all  Food Insecurity: No Food Insecurity  .  Worried About Charity fundraiser in the Last Year: Never true  . Ran Out of Food in the Last Year: Never true  Transportation Needs: Unmet Transportation Needs  . Lack of Transportation (Medical): Yes  . Lack of Transportation (Non-Medical): No  Physical Activity:   . Days of Exercise per Week:   . Minutes of Exercise per Session:   Stress:   . Feeling of Stress :   Social Connections:   . Frequency of Communication with Friends and Family:   . Frequency of Social Gatherings with  Friends and Family:   . Attends Religious Services:   . Active Member of Clubs or Organizations:   . Attends Archivist Meetings:   Marland Kitchen Marital Status:    Family History  Problem Relation Age of Onset  . Pancreatic cancer Mother   . Colon cancer Maternal Aunt   . Bone cancer Brother   . Heart attack Brother   . Hypercalcemia Neg Hx    Allergies  Allergen Reactions  . Trospium Chloride Er Other (See Comments)    Constipation    Prior to Admission medications   Medication Sig Start Date End Date Taking? Authorizing Provider  acetaminophen (TYLENOL) 325 MG tablet Take 325 mg by mouth every 6 (six) hours as needed for mild pain.   Yes [provider]  aspirin EC 81 MG tablet Take 1 tablet (81 mg total) daily by mouth. Patient taking differently: Take 81 mg by mouth daily as needed for moderate pain.  08/10/17  Yes Burnell Blanks, MD  carbidopa-levodopa (SINEMET IR) 25-100 MG tablet Take 1 tablet twice a day at breakfast and dinner 02/28/19  Yes Cameron Sprang, MD  carvedilol (COREG) 12.5 MG tablet Take 1 tablet by mouth 2 (two) times a day. With food 12/16/18  Yes [provider]  donepezil (ARICEPT) 10 MG tablet Take 1/2 tablet daily for 2 weeks, then increase to 1 tablet daily Patient taking differently: Take 10 mg by mouth daily.  02/28/19  Yes Cameron Sprang, MD  furosemide (LASIX) 20 MG tablet Take 1 tablet by mouth daily. 01/08/19  Yes [provider]  guaifenesin (ROBITUSSIN) 100 MG/5ML syrup Take 200 mg by mouth 4 (four) times daily as needed for cough.   Yes [provider]  loperamide (IMODIUM) 2 MG capsule Take 2 mg by mouth as needed for diarrhea or loose stools.   Yes [provider]  magnesium hydroxide (MILK OF MAGNESIA) 400 MG/5ML suspension Take 30 mLs by mouth at bedtime as needed for mild constipation.   Yes [provider]  olmesartan (BENICAR) 20 MG tablet Take 20 mg by mouth daily. 10/04/19  Yes  [provider]  Polyethyl Glycol-Propyl Glycol (SYSTANE OP) Place 1-2 drops into both eyes 3 (three) times daily as needed (for dry eyes.).   Yes [provider]  polyethylene glycol (MIRALAX / GLYCOLAX) packet Take 17 g by mouth daily. 04/05/18  Yes Domenic Moras, PA-C  rosuvastatin (CRESTOR) 10 MG tablet Take 1 tablet (10 mg total) by mouth daily. 05/01/11  Yes Evans Lance, MD  sertraline (ZOLOFT) 100 MG tablet Take 1 tablet (100 mg total) by mouth daily. Patient taking differently: Take 100 mg by mouth daily as needed (depression).  02/28/19  Yes Cameron Sprang, MD  Multiple Vitamin (MULTIVITAMIN WITH MINERALS) TABS tablet Take 1 tablet by mouth daily.     [provider]   DG Chest 1 View  Result Date: 12/29/2019 CLINICAL  DATA:  64 year old female with fall and hip pain. EXAM: CHEST  1 VIEW COMPARISON:  Chest radiograph dated 12/22/2019. FINDINGS: The lungs are clear. There is no pleural effusion or pneumothorax. The cardiac silhouette is within normal limits. Median sternotomy wires, left pectoral AICD device, and cardiac valve replacement. Atherosclerotic calcification of the aorta. No acute osseous pathology. Right upper quadrant cholecystectomy clips. IMPRESSION: No acute cardiopulmonary process. Electronically Signed   By: Anner Crete M.D.   On: 12/29/2019 22:41   CT HEAD WO CONTRAST  Result Date: 12/29/2019 CLINICAL DATA:  64 year old female with head trauma. EXAM: CT HEAD WITHOUT CONTRAST CT CERVICAL SPINE WITHOUT CONTRAST TECHNIQUE: Multidetector CT imaging of the head and cervical spine was performed following the standard protocol without intravenous contrast. Multiplanar CT image reconstructions of the cervical spine were also generated. COMPARISON:  Head CT dated 12/22/2019. FINDINGS: CT HEAD FINDINGS Brain: Moderate age-related atrophy and chronic microvascular ischemic changes. There is no acute intracranial hemorrhage. No mass effect or midline shift. No  extra-axial fluid collection. Vascular: No hyperdense vessel or unexpected calcification. Skull: Normal. Negative for fracture or focal lesion. Sinuses/Orbits: The visualized paranasal sinuses are clear. There is opacification of several mastoid air cells bilaterally. Other: None CT CERVICAL SPINE FINDINGS Alignment: No acute subluxation. Skull base and vertebrae: No acute fracture. Soft tissues and spinal canal: No prevertebral fluid or swelling. No visible canal hematoma. Disc levels:  Mild degenerative changes. Upper chest: Negative. Other: Bilateral carotid bulb calcified plaques. IMPRESSION: 1. No acute intracranial pathology. 2. No acute/traumatic cervical spine pathology. Electronically Signed   By: Anner Crete M.D.   On: 12/29/2019 22:26   CT CERVICAL SPINE WO CONTRAST  Result Date: 12/29/2019 CLINICAL DATA:  64 year old female with head trauma. EXAM: CT HEAD WITHOUT CONTRAST CT CERVICAL SPINE WITHOUT CONTRAST TECHNIQUE: Multidetector CT imaging of the head and cervical spine was performed following the standard protocol without intravenous contrast. Multiplanar CT image reconstructions of the cervical spine were also generated. COMPARISON:  Head CT dated 12/22/2019. FINDINGS: CT HEAD FINDINGS Brain: Moderate age-related atrophy and chronic microvascular ischemic changes. There is no acute intracranial hemorrhage. No mass effect or midline shift. No extra-axial fluid collection. Vascular: No hyperdense vessel or unexpected calcification. Skull: Normal. Negative for fracture or focal lesion. Sinuses/Orbits: The visualized paranasal sinuses are clear. There is opacification of several mastoid air cells bilaterally. Other: None CT CERVICAL SPINE FINDINGS Alignment: No acute subluxation. Skull base and vertebrae: No acute fracture. Soft tissues and spinal canal: No prevertebral fluid or swelling. No visible canal hematoma. Disc levels:  Mild degenerative changes. Upper chest: Negative. Other: Bilateral  carotid bulb calcified plaques. IMPRESSION: 1. No acute intracranial pathology. 2. No acute/traumatic cervical spine pathology. Electronically Signed   By: Anner Crete M.D.   On: 12/29/2019 22:26   DG Hip Unilat With Pelvis 2-3 Views Left  Result Date: 12/29/2019 CLINICAL DATA:  Pain status post fall EXAM: DG HIP (WITH OR WITHOUT PELVIS) 2-3V LEFT COMPARISON:  None. FINDINGS: There is an acute displaced subcapital/transcervical fracture of the proximal left femur. There is no dislocation. The osseous mineralization is slightly decreased. IMPRESSION: Acute displaced subcapital/transcervical fracture of the proximal left femur. Electronically Signed   By: Constance Holster M.D.   On: 12/29/2019 22:39   DG Knee 3 Views Left  Result Date: 12/30/2019 CLINICAL DATA:  Fall.  Pain. EXAM: LEFT KNEE - 3 VIEW COMPARISON:  None. FINDINGS: No fracture. No subluxation or dislocation. No joint effusion. Meniscal mineralization evident. IMPRESSION: Negative. Electronically  Signed   By: Misty Stanley M.D.   On: 12/30/2019 09:20    Positive ROS: All other systems have been reviewed and were otherwise negative with the exception of those mentioned in the HPI and as above.  Physical Exam: General: Alert, no acute distress Cardiovascular: No pedal edema Respiratory: No cyanosis, no use of accessory musculature GI: No organomegaly, abdomen is soft and non-tender Skin: No lesions in the area of chief complaint Neurologic: Sensation intact distally Psychiatric: Pleasant mood and affect, poor historian Lymphatic: No axillary or cervical lymphadenopathy  MUSCULOSKELETAL: Examination of the left hip reveals no skin wounds or lesions.  She is shortened and externally rotated.  She has pain with attempted logrolling of the hip.  She has positive motor function dorsiflexion, plantarflexion, and great toe extension.  Foot is well-perfused.  She reports intact sensation.  Assessment: Displaced left femoral neck  fracture. Dementia. Multiple medical problems.  Plan: I discussed the findings with the patient.  I also called her son, [name], on the phone.  She has an unstable left hip fracture.  We discussed that the most durable solution is a left total hip arthroplasty to restore mobility and for pain control.  We discussed the risk, benefits, and alternatives.  Please see statement of risk.  Depending on the OR schedule, we will plan for surgery later today versus tomorrow morning.  Keep n.p.o. for now.  Hold chemical DVT prophylaxis.  All questions were solicited and answered.  The risks, benefits, and alternatives were discussed with the patient. There are risks associated with the surgery including, but not limited to, problems with anesthesia (death), infection, instability (giving out of the joint), dislocation, differences in leg length/angulation/rotation, fracture of bones, loosening or failure of implants, hematoma (blood accumulation) which may require surgical drainage, blood clots, pulmonary embolism, nerve injury (foot drop and lateral thigh numbness), and blood vessel injury. The patient understands these risks and elects to proceed.   Bertram Savin, MD [phone]    12/31/2019 8:41 AM

## 2019-12-31 NOTE — Transfer of Care (Signed)
Immediate Anesthesia Transfer of Care Note  Patient: [name]  Procedure(s) Performed: Procedure(s): TOTAL HIP ARTHROPLASTY ANTERIOR APPROACH (Left)  Patient Location: PACU  Anesthesia Type:General  Level of Consciousness:  sedated, patient cooperative and responds to stimulation  Airway & Oxygen Therapy:Patient Spontanous Breathing and Patient connected to face mask oxgen  Post-op Assessment:  Report given to PACU RN and Post -op Vital signs reviewed and stable  Post vital signs:  Reviewed and stable  Last Vitals:  Vitals:   12/31/19 1407 12/31/19 1608  BP: (!) 172/105 (!) 144/99  Pulse: 71 67  Resp: 15 16  Temp: 37.3 C (!) 36.2 C  SpO2: 712% 52%    Complications: No apparent anesthesia complications

## 2019-12-31 NOTE — Progress Notes (Signed)
PROGRESS NOTE    [name]  TFT:[id number] DOB: [birth year] DOA: 12/29/2019 PCP: Tiffany Amel, MD    Brief Narrative:  Patient was admitted to the hospital working diagnosis of acute displaced subcapital/transcervical fracture over the proximal femur.  64 year old female who presented with left hip pain after mechanical fall.  She does have significant past medical history for Parkinson's disease, dementia, aortic stenosis status post AVR, nonischemic cardiomyopathy status post AICD, hypertension and orthostatic hypotension.  Patient had recent hospitalization for ambulatory dysfunction, discharged to ambulatory care.  Apparently she continued to have multiple falls.  Skull examination blood pressure 161/120, heart rate 93, respirate 16, oxygen saturation 97%, her lungs are clear to auscultation bilaterally, heart S1-S2 present rhythmic, soft abdomen, no lower extremity edema, left leg was shortened and outpatient. X-rays show acute displaced subcapital/transcervical fracture of the proximal femur.  Patient has been seen by orthopedics, plan for orthopedic intervention.  Assessment & Plan:   Principal Problem:   Closed left hip fracture, initial encounter [name]) Active Problems:   Essential hypertension   Chronic diastolic CHF (congestive heart failure) ([facility])   Dementia due to Parkinson's disease without behavioral disturbance ([facility])   Orthostatic hypotension   1. Acute left hip fracture. Continue pain control with DVT prophylaxis, for possible surgical intervention today or tomorrow morning.   Continue with as needed morphine and   2. HTN. Continue carvedilol, will resume ARB for uncontrolled HTN.   3. Chronic diastolic heart failure. No signs of acute exacerbation, will continue blood pressure monitoring.   4. Parkinson's disease with dementia. Continue sinemet, will need physical therapy post op.   5. AKI. Patient received IV fluids on admission, renal function today with a  serum cr at 0,64 with K at 4,1 and serum bicarbonate at 20.     DVT prophylaxis: scd   Code Status:  full Family Communication: no family at the bedside  Disposition Plan/ discharge barriers: patient from snf, barrier for dc acute left hip fx, needing surgical intervention.    Consultants:   Orthopedics   Subjective: Patient continue to have left leg pain, moderate in intensity, worse to movement and touch, no dyspnea or chest pain.   Objective: Vitals:   12/30/19 1738 12/30/19 2122 12/31/19 0235 12/31/19 0645  BP: 140/60 128/75 (!) 153/102 (!) 161/101  Pulse: 68 79 74 81  Resp:  16 16 16   Temp:  97.9 F (36.6 C) 99.7 F (37.6 C) 98.3 F (36.8 C)  TempSrc:  Oral Oral Oral  SpO2:  100% 99% 97%  Weight:    60.1 kg  Height:        Intake/Output Summary (Last 24 hours) at 12/31/2019 1255 Last data filed at 12/31/2019 0859 Gross per 24 hour  Intake 1142.93 ml  Output 650 ml  Net 492.93 ml   Filed Weights   12/30/19 1418 12/31/19 0645  Weight: 58 kg 60.1 kg    Examination:   General: Not in pain or dyspnea, deconditioned  Neurology: Awake and alert, non focal  E ENT: no pallor, no icterus, oral mucosa moist Cardiovascular: No JVD. S1-S2 present, rhythmic, no gallops, rubs, or murmurs. No lower extremity edema. Pulmonary: vesicular breath sounds bilaterally, adequate air movement, no wheezing, rhonchi or rales. Gastrointestinal. Abdomen with no organomegaly, non tender, no rebound or guarding Skin. No rashes Musculoskeletal: left leg shortened compared to right,      Data Reviewed: I have personally reviewed following labs and imaging studies  CBC: Recent Labs  Lab 12/25/19 0110  12/29/19 2248 12/30/19 0758 12/31/19 0337  WBC 4.7 8.0 6.0 6.3  NEUTROABS  --  6.1  --   --   HGB 13.4 15.2* 13.9 13.0  HCT 41.6 46.5* 41.8 39.9  MCV 88.5 88.7 86.2 88.5  PLT 143* 132* 117* 161*   Basic Metabolic Panel: Recent Labs  Lab 12/25/19 0110 12/26/19 0336  12/29/19 2248 12/30/19 0758 12/31/19 0337  NA 135 139 139 137 137  K 4.2 4.0 4.1 3.7 4.1  CL 104 110 108 107 109  CO2 19* 19* 24 20* 20*  GLUCOSE 100* 91 124* 117* 95  BUN 30* 19 21 16 15   CREATININE 1.29* 0.72 1.12* 0.69 0.64  CALCIUM 10.2 9.8 10.5* 10.2 9.7  MG  --   --   --   --  1.8   GFR: Estimated Creatinine Clearance: 60.5 mL/min (by C-G formula based on SCr of 0.64 mg/dL). Liver Function Tests: Recent Labs  Lab 12/31/19 0337  AST 28  ALT 17  ALKPHOS 73  BILITOT 0.8  PROT 6.7  ALBUMIN 3.4*   No results for input(s): LIPASE, AMYLASE in the last 168 hours. No results for input(s): AMMONIA in the last 168 hours. Coagulation Profile: Recent Labs  Lab 12/29/19 2248  INR 0.9   Cardiac Enzymes: No results for input(s): CKTOTAL, CKMB, CKMBINDEX, TROPONINI in the last 168 hours. BNP (last 3 results) No results for input(s): PROBNP in the last 8760 hours. HbA1C: No results for input(s): HGBA1C in the last 72 hours. CBG: No results for input(s): GLUCAP in the last 168 hours. Lipid Profile: No results for input(s): CHOL, HDL, LDLCALC, TRIG, CHOLHDL, LDLDIRECT in the last 72 hours. Thyroid Function Tests: No results for input(s): TSH, T4TOTAL, FREET4, T3FREE, THYROIDAB in the last 72 hours. Anemia Panel: No results for input(s): VITAMINB12, FOLATE, FERRITIN, TIBC, IRON, RETICCTPCT in the last 72 hours.    Radiology Studies: I have reviewed all of the imaging during this hospital visit personally     Scheduled Meds: . carbidopa-levodopa  1 tablet Oral BID  . carvedilol  12.5 mg Oral BID WC  . chlorhexidine  60 mL Topical Once  . Chlorhexidine Gluconate Cloth  6 each Topical Daily  . donepezil  10 mg Oral Daily  . feeding supplement  296 mL Oral Once  . povidone-iodine  2 application Topical Once  . povidone-iodine  2 application Topical Once  . rosuvastatin  10 mg Oral q1800   Continuous Infusions: .  ceFAZolin (ANCEF) IV    . tranexamic acid        LOS: 1 day        [name] Gerome Apley, MD

## 2019-12-31 NOTE — Anesthesia Postprocedure Evaluation (Signed)
Anesthesia Post Note  Patient: NAKEIA CALVI  Procedure(s) Performed: TOTAL HIP ARTHROPLASTY ANTERIOR APPROACH (Left Hip)     Patient location during evaluation: PACU Anesthesia Type: General Level of consciousness: oriented, sedated and patient cooperative Pain management: pain level controlled Vital Signs Assessment: post-procedure vital signs reviewed and stable Respiratory status: spontaneous breathing, nonlabored ventilation, respiratory function stable and patient connected to nasal cannula oxygen Cardiovascular status: blood pressure returned to baseline and stable (paced at 70) Postop Assessment: no apparent nausea or vomiting Anesthetic complications: no    Last Vitals:  Vitals:   12/31/19 2030 12/31/19 2034  BP: (!) 171/101 (!) 163/98  Pulse: 70 68  Resp: 15 14  Temp:    SpO2: 99% 100%    Last Pain:  Vitals:   12/31/19 2030  TempSrc:   PainSc: Asleep    LLE Motor Response: Purposeful movement;Responds to commands (12/31/19 2030) LLE Sensation: Full sensation (12/31/19 2030)          Seleta Rhymes. Daune Divirgilio

## 2020-01-01 LAB — BASIC METABOLIC PANEL
Anion gap: 7 (ref 5–15)
BUN: 16 mg/dL (ref 8–23)
CO2: 23 mmol/L (ref 22–32)
Calcium: 9.2 mg/dL (ref 8.9–10.3)
Chloride: 105 mmol/L (ref 98–111)
Creatinine, Ser: 0.92 mg/dL (ref 0.44–1.00)
GFR calc Af Amer: >60 mL/min (ref >60)
GFR calc non Af Amer: >60 mL/min (ref >60)
Glucose, Bld: 133 mg/dL — ABNORMAL HIGH (ref 70–99)
Potassium: 4.3 mmol/L (ref 3.5–5.1)
Sodium: 135 mmol/L (ref 135–145)

## 2020-01-01 LAB — CBC
HCT: 33.4 % — ABNORMAL LOW (ref 36.0–46.0)
Hemoglobin: 10.5 g/dL — ABNORMAL LOW (ref 12.0–15.0)
MCH: 28.7 pg (ref 26.0–34.0)
MCHC: 31.4 g/dL (ref 30.0–36.0)
MCV: 91.3 fL (ref 80.0–100.0)
Platelets: 99 10*3/uL — ABNORMAL LOW (ref 150–400)
RBC: 3.66 MIL/uL — ABNORMAL LOW (ref 3.87–5.11)
RDW: 15.6 % — ABNORMAL HIGH (ref 11.5–15.5)
WBC: 6.3 10*3/uL (ref 4.0–10.5)
nRBC: 0 % (ref 0.0–0.2)

## 2020-01-01 MED ORDER — ASPIRIN 325 MG PO TBEC: 325.0000 mg | DELAYED_RELEASE_TABLET | Freq: Every day | ORAL | 0 refills | Status: AC

## 2020-01-01 MED ORDER — TRAMADOL HCL 50 MG PO TABS: 50.0000 mg | ORAL_TABLET | Freq: Four times a day (QID) | ORAL | 0 refills | Status: DC | PRN

## 2020-01-01 NOTE — Progress Notes (Addendum)
PROGRESS NOTE    [name]  EQA:[id number] DOB: 1956-04-21 DOA: 12/29/2019 PCP: Lujean Amel, MD    Brief Narrative:  Patient was admitted to the hospital working diagnosis of acute displaced subcapital/transcervical fracture over the proximal femur.  64 year old female who presented with left hip pain after mechanical fall.  She does have significant past medical history for Parkinson's disease, dementia, aortic stenosis status post AVR, nonischemic cardiomyopathy status post AICD, hypertension and orthostatic hypotension.  Patient had recent hospitalization for ambulatory dysfunction, discharged to ambulatory care.  Apparently she continued to have multiple falls.  Skull examination blood pressure 161/120, heart rate 93, respirate 16, oxygen saturation 97%, her lungs are clear to auscultation bilaterally, heart S1-S2 present rhythmic, soft abdomen, no lower extremity edema, left leg was shortened and outpatient. X-rays show acute displaced subcapital/transcervical fracture of the proximal femur.  Patient has been seen by orthopedics, plan for orthopedic intervention   Assessment & Plan:   Principal Problem:   Closed left hip fracture, initial encounter [facility]) Active Problems:   Essential hypertension   Chronic diastolic CHF (congestive heart failure) ([place])   Dementia due to Parkinson's disease without behavioral disturbance (HCC)   Orthostatic hypotension   1. Acute left hip fracture. Patient now sp left total hip arthroplasty femoral neck fracture. Patient with adequate pain control, continue with DVT prophylaxis.   Follow with orthopedic surgery and physical therapy recommendations.    2. HTN. / dyslipidemia. Blood pressure control with carvedilol and irbesartan, continue blood pressure monitoring.    Continue with statin therapy.   3. Chronic diastolic heart failure. No clinical signs of acute exacerbation, continue carvedilol and irbesartan  4. Parkinson's disease  with dementia. On sinemet, follow with physical therapy recommendations. Continue with donepezil.   5. AKI. Stable renal function with serum cr at 0,92 with K at 4,3 and serum bicarbonate at 23. Will follow up on renal function in am.    DVT prophylaxis: scd   Code Status:  full Family Communication: no family at the bedside  Disposition Plan/ discharge barriers: patient from snf, barrier for dc acute left hip fx, sp surgery, will need SNF    Nutrition Status: Nutrition Problem: Increased nutrient needs Etiology: post-op healing Signs/Symptoms: estimated needs Interventions: Refer to RD note for recommendations     Consultants:   Orthopedics   Procedures:  left total hip arthroplasty femoral neck fracture      Subjective: Left lower extremity pain is improved, but not yet back to baseline, no nausea or vomiting, no dyspnea or chest pain.   Objective: Vitals:   01/01/20 0002 01/01/20 0205 01/01/20 0544 01/01/20 0637  BP: (!) 139/54 (!) 146/95 (!) 125/98   Pulse: 67 67 60   Resp: 14 16 16    Temp: 97.7 F (36.5 C) (!) 97.5 F (36.4 C) 97.9 F (36.6 C)   TempSrc: Oral Oral Oral   SpO2: 100% 100% 99%   Weight:    60.6 kg  Height:        Intake/Output Summary (Last 24 hours) at 01/01/2020 1144 Last data filed at 01/01/2020 1006 Gross per 24 hour  Intake 1840 ml  Output 1100 ml  Net 740 ml   Filed Weights   12/31/19 0645 12/31/19 1609 01/01/20 0637  Weight: 60.1 kg 60.1 kg 60.6 kg    Examination:   General: Not in pain or dyspnea, deconditioned  Neurology: Awake and alert, non focal  E ENT: mild pallor, no icterus, oral mucosa moist Cardiovascular: No JVD. S1-S2  present, rhythmic, no gallops, rubs, or murmurs. No lower extremity edema. Pulmonary: positive breath sounds bilaterally, adequate air movement, no wheezing, rhonchi or rales. Gastrointestinal. Abdomen with no organomegaly, non tender, no rebound or guarding Skin. No rashes Musculoskeletal: no  joint deformities     Data Reviewed: I have personally reviewed following labs and imaging studies  CBC: Recent Labs  Lab 12/29/19 2248 12/30/19 0758 12/31/19 0337 01/01/20 0338  WBC 8.0 6.0 6.3 6.3  NEUTROABS 6.1  --   --   --   HGB 15.2* 13.9 13.0 10.5*  HCT 46.5* 41.8 39.9 33.4*  MCV 88.7 86.2 88.5 91.3  PLT 132* 117* 102* 99*   Basic Metabolic Panel: Recent Labs  Lab 12/26/19 0336 12/29/19 2248 12/30/19 0758 12/31/19 0337 01/01/20 0338  NA 139 139 137 137 135  K 4.0 4.1 3.7 4.1 4.3  CL 110 108 107 109 105  CO2 19* 24 20* 20* 23  GLUCOSE 91 124* 117* 95 133*  BUN 19 21 16 15 16   CREATININE 0.72 1.12* 0.69 0.64 0.92  CALCIUM 9.8 10.5* 10.2 9.7 9.2  MG  --   --   --  1.8  --    GFR: Estimated Creatinine Clearance: 52.6 mL/min (by C-G formula based on SCr of 0.92 mg/dL). Liver Function Tests: Recent Labs  Lab 12/31/19 0337  AST 28  ALT 17  ALKPHOS 73  BILITOT 0.8  PROT 6.7  ALBUMIN 3.4*   No results for input(s): LIPASE, AMYLASE in the last 168 hours. No results for input(s): AMMONIA in the last 168 hours. Coagulation Profile: Recent Labs  Lab 12/29/19 2248  INR 0.9   Cardiac Enzymes: No results for input(s): CKTOTAL, CKMB, CKMBINDEX, TROPONINI in the last 168 hours. BNP (last 3 results) No results for input(s): PROBNP in the last 8760 hours. HbA1C: No results for input(s): HGBA1C in the last 72 hours. CBG: No results for input(s): GLUCAP in the last 168 hours. Lipid Profile: No results for input(s): CHOL, HDL, LDLCALC, TRIG, CHOLHDL, LDLDIRECT in the last 72 hours. Thyroid Function Tests: No results for input(s): TSH, T4TOTAL, FREET4, T3FREE, THYROIDAB in the last 72 hours. Anemia Panel: No results for input(s): VITAMINB12, FOLATE, FERRITIN, TIBC, IRON, RETICCTPCT in the last 72 hours.    Radiology Studies: I have reviewed all of the imaging during this hospital visit personally     Scheduled Meds: . aspirin EC  325 mg Oral Q  breakfast  . carbidopa-levodopa  1 tablet Oral BID  . carvedilol  12.5 mg Oral BID WC  . docusate sodium  100 mg Oral BID  . donepezil  10 mg Oral Daily  . irbesartan  75 mg Oral Daily  . rosuvastatin  10 mg Oral q1800   Continuous Infusions:   LOS: 2 days        Dorethy Tomey Gerome Apley, MD

## 2020-01-01 NOTE — Progress Notes (Signed)
    Subjective:  Patient reports pain as mild to moderate.  Denies N/V/CP/SOB.   Objective:   VITALS:   Vitals:   01/01/20 0002 01/01/20 0205 01/01/20 0544 01/01/20 0637  BP: (!) 139/54 (!) 146/95 (!) 125/98   Pulse: 67 67 60   Resp: 14 16 16    Temp: 97.7 F (36.5 C) (!) 97.5 F (36.4 C) 97.9 F (36.6 C)   TempSrc: Oral Oral Oral   SpO2: 100% 100% 99%   Weight:    60.6 kg  Height:        NAD, pleasantly confused ABD soft Sensation intact distally Intact pulses distally Dorsiflexion/Plantar flexion intact Incision: dressing C/D/I Compartment soft   Lab Results  Component Value Date   WBC 6.3 01/01/2020   HGB 10.5 (L) 01/01/2020   HCT 33.4 (L) 01/01/2020   MCV 91.3 01/01/2020   PLT 99 (L) 01/01/2020   BMET    Component Value Date/Time   NA 135 01/01/2020 0338   NA 139 07/06/2017 1152   K 4.3 01/01/2020 0338   CL 105 01/01/2020 0338   CO2 23 01/01/2020 0338   GLUCOSE 133 (H) 01/01/2020 0338   BUN 16 01/01/2020 0338   BUN 12 07/06/2017 1152   CREATININE 0.92 01/01/2020 0338   CREATININE 0.95 07/03/2016 1148   CALCIUM 9.2 01/01/2020 0338   GFRNONAA >60 01/01/2020 0338   GFRAA >60 01/01/2020 0338     Assessment/Plan: 1 Day Post-Op   Principal Problem:   Closed left hip fracture, initial encounter (HCC) Active Problems:   Essential hypertension   Chronic diastolic CHF (congestive heart failure) (HCC)   Dementia due to Parkinson's disease without behavioral disturbance (HCC)   Orthostatic hypotension   WBAT with walker DVT ppx: Aspirin, SCDs, TEDS PO pain control PT/OT Dispo: D/C planning   Hilton Cork Kalib Bhagat 01/01/2020, 11:24 AM   Rod Can, MD [phone] [facility]  [place] [address], [address], [place], [place] [address] Phone: [phone] [email or web address] Facebook  [employer]

## 2020-01-01 NOTE — Telephone Encounter (Signed)
I spoke with the [facility] and he agreed to get a monitor to the pt.

## 2020-01-01 NOTE — Progress Notes (Signed)
Physical Therapy Treatment Patient Details Name: [name] MRN: [id number] DOB: 1955-11-03 Today's Date: 01/01/2020    History of Present Illness Pt admitted from ALF s/p fall with L hip fx and now is s/p L THR by anterior direct approach.  Pt with hx of CAD, CHF, AICD, and dementia    PT Comments    Pt continues cooperative but requiring increased time and multi-modal cueing to progress with mobility.   Follow Up Recommendations  SNF     Equipment Recommendations  None recommended by PT    Recommendations for Other Services       Precautions / Restrictions Precautions Precautions: Fall Restrictions Weight Bearing Restrictions: No    Mobility  Bed Mobility Overal bed mobility: Needs Assistance Bed Mobility: Sit to Supine       Sit to supine: Mod assist;+2 for physical assistance;+2 for safety/equipment   General bed mobility comments: Increased time with cues for sequence and use of R LE to self assist  Transfers Overall transfer level: Needs assistance Equipment used: Rolling walker (2 wheeled) Transfers: Sit to/from Stand Sit to Stand: Min assist;Mod assist;+2 physical assistance;+2 safety/equipment;From elevated surface         General transfer comment: cues for LE management and use of UEs to self assist; physical assist to bring wt up and fwd and to balance in initial standind  Ambulation/Gait Ambulation/Gait assistance: Min assist;+2 physical assistance;+2 safety/equipment Gait Distance (Feet): 26 Feet Assistive device: Rolling walker (2 wheeled) Gait Pattern/deviations: Step-to pattern;Decreased step length - right;Decreased step length - left;Shuffle;Trunk flexed;Antalgic Gait velocity: decr   General Gait Details: Increased time with cues for sequence, posture, position from [employer]             Wheelchair Mobility    Modified Rankin (Stroke Patients Only)       Balance Overall balance assessment: Needs  assistance Sitting-balance support: Feet supported Sitting balance-Leahy Scale: Fair     Standing balance support: Bilateral upper extremity supported;During functional activity Standing balance-Leahy Scale: Poor Standing balance comment: reliant on at least single UE support for balance and safety with mobility.                            Cognition Arousal/Alertness: Awake/alert Behavior During Therapy: WFL for tasks assessed/performed;Flat affect Overall Cognitive Status: History of cognitive impairments - at baseline                                 General Comments: dementia at baseline. Confused regarding location, follows direction well.      Exercises      General Comments        Pertinent Vitals/Pain Pain Assessment: 0-10 Pain Score: 2  Pain Descriptors / Indicators: Aching Pain Intervention(s): Limited activity within patient's tolerance;Monitored during session;Premedicated before session    Home Living                      Prior Function            PT Goals (current goals can now be found in the care plan section) Acute Rehab PT Goals Patient Stated Goal: to walk PT Goal Formulation: With patient Time For Goal Achievement: 01/06/20 Potential to Achieve Goals: Good Progress towards PT goals: Progressing toward goals    Frequency    Min 3X/week      PT Plan Current  plan remains appropriate    Co-evaluation              AM-PAC PT "6 Clicks" Mobility   Outcome Measure  Help needed turning from your back to your side while in a flat bed without using bedrails?: A Lot Help needed moving from lying on your back to sitting on the side of a flat bed without using bedrails?: A Lot Help needed moving to and from a bed to a chair (including a wheelchair)?: A Lot Help needed standing up from a chair using your arms (e.g., wheelchair or bedside chair)?: A Lot Help needed to walk in hospital room?: A Lot Help needed  climbing 3-5 steps with a railing? : A Lot 6 Click Score: 12    End of Session Equipment Utilized During Treatment: Gait belt Activity Tolerance: Patient tolerated treatment well Patient left: in bed;with call bell/phone within reach;with bed alarm set Nurse Communication: Mobility status PT Visit Diagnosis: Muscle weakness (generalized) (M62.81);History of falling (Z91.81);Other abnormalities of gait and mobility (R26.89);Difficulty in walking, not elsewhere classified (R26.2)     Time: 1610-9604 PT Time Calculation (min) (ACUTE ONLY): 25 min  Charges:  $Gait Training: 23-37 mins                     [place] Pager [phone] Office [phone]    Emmalena Canny 01/01/2020, 5:11 PM

## 2020-01-01 NOTE — TOC Initial Note (Signed)
Transition of Care [name]) - Initial/Assessment Note    Patient Details  Name: [name] MRN: [id number] Date of Birth: 02/17/1956  Transition of Care [name]) CM/SW Contact:    Lia Hopping, [place] Phone Number: 01/01/2020, 2:57 PM  Clinical Narrative:                 Patient is a resident at [name]. Patient had fall and suffered a displaced left femoral neck fracture. Patient underwent Left total Hip arthroplasty. CSW reached out to the memory care, the staff request the patient go to SNF for rehab. Per staff, prior to the patient falling she was independent with ambulating. CSW reached out to the patient son [name] to discuss SNF options, left voicemail to return call.  Patient brother was at bedside and also left voicemail for patient son return csw call.   FL2 complete.  HTA auth started, will follow up with SNF choice   Expected Discharge Plan: Memory Care Barriers to Discharge: Continued Medical Work up   Patient Goals and CMS Choice Patient states their goals for this hospitalization and ongoing recovery are:: rehab at [name] CMS Medicare.gov Compare Post Acute Care list provided to:: Other (Comment Required)(Son) Choice offered to / list presented to : Patient, Adult Children  Expected Discharge Plan and Services Expected Discharge Plan: Memory Care     Post Acute Care Choice: (Memory Care) Living arrangements for the past 2 months: [place] Expected Discharge Date: (unknown)                                    Prior Living Arrangements/Services Living arrangements for the past 2 months: [place] Lives with:: Facility Resident Patient language and need for interpreter reviewed:: Yes Do you feel safe going back to the place where you live?: Yes      Need for Family Participation in Patient Care: Yes (Comment) Care giver support system in place?: Yes (comment)   Criminal Activity/Legal Involvement  Pertinent to Current Situation/Hospitalization: No - Comment as needed  Activities of Daily Living Home Assistive Devices/Equipment: Blood pressure cuff, Grab bars around toilet, Grab bars in shower, Hand-held shower hose, Walker (specify type), Eyeglasses, Scales(front wheeled walker, upper denture) ADL Screening (condition at time of admission) Patient's cognitive ability adequate to safely complete daily activities?: No Is the patient deaf or have difficulty hearing?: Yes(slight hoh) Does the patient have difficulty seeing, even when wearing glasses/contacts?: No Does the patient have difficulty concentrating, remembering, or making decisions?: Yes Patient able to express need for assistance with ADLs?: Yes Does the patient have difficulty dressing or bathing?: Yes Independently performs ADLs?: No Communication: Independent Dressing (OT): Needs assistance Is this a change from baseline?: Pre-admission baseline Grooming: Needs assistance Is this a change from baseline?: Pre-admission baseline Feeding: Needs assistance Is this a change from baseline?: Pre-admission baseline Bathing: Needs assistance Is this a change from baseline?: Pre-admission baseline Toileting: Dependent Is this a change from baseline?: Change from baseline, expected to last >3days In/Out Bed: Dependent Is this a change from baseline?: Change from baseline, expected to last >3 days Walks in Home: Dependent Is this a change from baseline?: Change from baseline, expected to last >3 days Does the patient have difficulty walking or climbing stairs?: Yes Weakness of Legs: Both(Left>Right) Weakness of Arms/Hands: None  Permission Sought/Granted Permission sought to share information with : Facility Sport and exercise psychologist, Family Supports Permission  granted to share information with : Yes, Verbal Permission Granted  Share Information with NAME: [name]  Permission granted to share info w AGENCY: [name] granted to share info w Relationship: Son  Permission granted to share info w Contact Information: [phone]  Emotional Assessment Appearance:: Appears stated age Attitude/Demeanor/Rapport: Engaged Affect (typically observed): Pleasant Orientation: : Oriented to Self, Oriented to Place, Oriented to Situation Alcohol / Substance Use: Not Applicable Psych Involvement: No (comment)  Admission diagnosis:  Frequent falls [R29.6] Left displaced femoral neck fracture ([place]) [S72.002A] Essential hypertension [I10] Closed left hip fracture, initial encounter ([place]) [S72.002A] Patient Active Problem List   Diagnosis Date Noted  . Closed left hip fracture, initial encounter ([place]) 12/30/2019  . Frequent falls   . Orthostatic hypotension 12/23/2019  . Syncopal episodes 12/22/2019  . Dementia due to Parkinson's disease without behavioral disturbance ([place]) 12/22/2019  . Renal insufficiency 12/22/2019  . Near syncope 09/04/2019  . AKI (acute kidney injury) ([place]) 09/04/2019  . Hypokalemia 09/04/2019  . Depression 09/04/2019  . Dementia ([place]) 09/04/2019  . Thrombocytopenia ([place]) 09/04/2019  . Low back pain 06/25/2019  . S/P AVR (aortic valve replacement) 12/12/2016  . Aortic stenosis 11/16/2016  . Severe aortic stenosis   . Polycythemia 06/06/2016  . Menopausal state 02/16/2016  . Hyperparathyroidism, primary ([place]) 02/16/2016  . Chronic diastolic CHF (congestive heart failure) ([place]) 05/07/2014  . Hx of colonic polyps 06/17/2013  . Ventricular tachycardia ([place]) 02/21/2011  . Automatic implantable cardioverter-defibrillator in situ 06/02/2009  . Hyperlipidemia 05/29/2009  . SMOKER 05/29/2009  . Essential hypertension 05/29/2009  . Aortic valve disorder 05/29/2009  . CARDIOMYOPATHY, SECONDARY 05/29/2009  . CHF 05/29/2009  . BICUSPID AORTIC VALVE 05/29/2009  . LIVER FUNCTION TESTS, ABNORMAL, HX OF 05/29/2009  . CHOLECYSTITIS, HX OF 05/29/2009   PCP:  Tiffany Amel,  MD Pharmacy:   [name] [place] [address] Phone: [phone] Fax: [phone]  [place], [name] [address] Phone: [phone] Fax: [phone]  [id number] - [place], [place] - [address] AT [place] [address] [place] [address] Phone: [phone] Fax: [phone]  [facility] [phone] [place], [place] - [place] AT [place] [address] [place] [address] Phone: [phone] Fax: [phone]     Social Determinants of Health ([place]) Interventions    Readmission Risk Interventions Readmission Risk Prevention Plan 12/25/2019  Transportation Screening Complete  PCP or Specialist Appt within 5-7 Days Complete  Home Care Screening Complete  Medication Review (RN CM) Complete  Some recent data might be hidden

## 2020-01-01 NOTE — Evaluation (Signed)
Physical Therapy Evaluation Patient Details Name: [name] MRN: [id number] DOB: 02-08-1956 Today's Date: 01/01/2020   History of Present Illness  Pt admitted from ALF s/p fall with L hip fx and now is s/p L THR by anterior direct approach.  Pt with hx of CAD, CHF, AICD, and dementia  Clinical Impression  Pt admitted as above and presenting with functional mobility limitations 2* decreased L LE strength/ROM, post op pain, ambulatory balance deficits and cognitive deficits related to dementia.  Pt would benefit from SNF level rehab to maximize IND and safety.    Follow Up Recommendations SNF    Equipment Recommendations  None recommended by PT    Recommendations for Other Services       Precautions / Restrictions Precautions Precautions: Fall Restrictions Weight Bearing Restrictions: No      Mobility  Bed Mobility Overal bed mobility: Needs Assistance Bed Mobility: Supine to Sit     Supine to sit: Min assist;Mod assist     General bed mobility comments: Increased time with cues for sequence and use of R LE to self assist  Transfers Overall transfer level: Needs assistance Equipment used: Rolling walker (2 wheeled) Transfers: Sit to/from Stand Sit to Stand: Min assist;Mod assist;+2 physical assistance;+2 safety/equipment;From elevated surface         General transfer comment: cues for LE management and use of UEs to self assist; physical assist to bring wt up and fwd and to balance in initial standind  Ambulation/Gait Ambulation/Gait assistance: Min assist;+2 physical assistance;+2 safety/equipment Gait Distance (Feet): 18 Feet Assistive device: Rolling walker (2 wheeled) Gait Pattern/deviations: Step-to pattern;Decreased step length - right;Decreased step length - left;Shuffle;Trunk flexed;Antalgic Gait velocity: decr   General Gait Details: cues for sequence, posture, position from [employer]            Wheelchair Mobility    Modified Rankin  (Stroke Patients Only)       Balance Overall balance assessment: Needs assistance Sitting-balance support: Feet supported Sitting balance-Leahy Scale: Fair     Standing balance support: Bilateral upper extremity supported;During functional activity Standing balance-Leahy Scale: Poor Standing balance comment: reliant on at least single UE support for balance and safety with mobility.                             Pertinent Vitals/Pain Pain Assessment: 0-10 Pain Score: 2  Pain Descriptors / Indicators: Aching Pain Intervention(s): Limited activity within patient's tolerance;Monitored during session;Premedicated before session;Ice applied    Home Living Family/patient expects to be discharged to:: Skilled nursing facility                      Prior Function Level of Independence: Needs assistance   Gait / Transfers Assistance Needed: Reports staff helps her walk, sometimes requires walker   ADL's / Homemaking Assistance Needed: Staff has to assist with bathing/dressing        Hand Dominance   Dominant Hand: Right    Extremity/Trunk Assessment   Upper Extremity Assessment Upper Extremity Assessment: Generalized weakness    Lower Extremity Assessment Lower Extremity Assessment: LLE deficits/detail LLE Deficits / Details: Strength at hip 2+/5 with AAROM at hip to 75 flex and 15 abd    Cervical / Trunk Assessment Cervical / Trunk Assessment: Normal  Communication   Communication: No difficulties  Cognition Arousal/Alertness: Awake/alert Behavior During Therapy: WFL for tasks assessed/performed Overall Cognitive Status: No family/caregiver present to determine baseline cognitive  functioning                                 General Comments: dementia at baseline. Confused regarding location, follows direction well.      General Comments      Exercises Total Joint Exercises Ankle Circles/Pumps: AROM;10 reps;Supine;Both Quad Sets:  AROM;Both;10 reps;Supine Heel Slides: AAROM;Left;15 reps;Supine Hip ABduction/ADduction: AAROM;15 reps;Supine;Left   Assessment/Plan    PT Assessment Patient needs continued PT services  PT Problem List Decreased strength;Decreased balance;Decreased mobility;Decreased cognition;Decreased knowledge of use of DME;Decreased safety awareness;Decreased knowledge of precautions       PT Treatment Interventions Gait training;Functional mobility training;Therapeutic activities;DME instruction;Therapeutic exercise;Balance training;Patient/family education;Cognitive remediation    PT Goals (Current goals can be found in the Care Plan section)  Acute Rehab PT Goals Patient Stated Goal: to walk PT Goal Formulation: With patient Time For Goal Achievement: 01/06/20 Potential to Achieve Goals: Good    Frequency Min 3X/week   Barriers to discharge        Co-evaluation               AM-PAC PT "6 Clicks" Mobility  Outcome Measure Help needed turning from your back to your side while in a flat bed without using bedrails?: A Lot Help needed moving from lying on your back to sitting on the side of a flat bed without using bedrails?: A Lot Help needed moving to and from a bed to a chair (including a wheelchair)?: A Lot Help needed standing up from a chair using your arms (e.g., wheelchair or bedside chair)?: A Lot Help needed to walk in hospital room?: A Lot Help needed climbing 3-5 steps with a railing? : A Lot 6 Click Score: 12    End of Session Equipment Utilized During Treatment: Gait belt Activity Tolerance: Patient tolerated treatment well Patient left: in chair;with call bell/phone within reach;with chair alarm set Nurse Communication: Mobility status PT Visit Diagnosis: Muscle weakness (generalized) (M62.81);History of falling (Z91.81);Other abnormalities of gait and mobility (R26.89);Difficulty in walking, not elsewhere classified (R26.2)    Time: 3335-4562 PT Time Calculation  (min) (ACUTE ONLY): 33 min   Charges:   PT Evaluation $PT Eval Low Complexity: 1 Low PT Treatments $Therapeutic Exercise: 8-22 mins        Debe Coder PT Acute Rehabilitation Services Pager [phone] Office [phone]   Zylee Marchiano 01/01/2020, 4:54 PM

## 2020-01-01 NOTE — NC FL2 (Signed)
[place] LEVEL OF CARE SCREENING TOOL     IDENTIFICATION  Patient Name: [name] Birthdate: Oct 10, 1955 Sex: female Admission Date (Current Location): 12/29/2019  [facility] and [place] Number:  Herbalist and Address:  [facility],  [place], [place]      Provider Number: [id number]  Attending Physician Name and Address:  Tawni Millers,*  Relative Name and Phone Number:  [name] [phone]    Current Level of Care: Hospital Recommended Level of Care: [place] Prior Approval Number:    Date Approved/Denied:   PASRR Number: [id number]  Discharge Plan: SNF    Current Diagnoses: Patient Active Problem List   Diagnosis Date Noted  . Closed left hip fracture, initial encounter ([place]) 12/30/2019  . Frequent falls   . Orthostatic hypotension 12/23/2019  . Syncopal episodes 12/22/2019  . Dementia due to Parkinson's disease without behavioral disturbance ([place]) 12/22/2019  . Renal insufficiency 12/22/2019  . Near syncope 09/04/2019  . AKI (acute kidney injury) ([place]) 09/04/2019  . Hypokalemia 09/04/2019  . Depression 09/04/2019  . Dementia ([place]) 09/04/2019  . Thrombocytopenia ([place]) 09/04/2019  . Low back pain 06/25/2019  . S/P AVR (aortic valve replacement) 12/12/2016  . Aortic stenosis 11/16/2016  . Severe aortic stenosis   . Polycythemia 06/06/2016  . Menopausal state 02/16/2016  . Hyperparathyroidism, primary ([place]) 02/16/2016  . Chronic diastolic CHF (congestive heart failure) ([place]) 05/07/2014  . Hx of colonic polyps 06/17/2013  . Ventricular tachycardia ([place]) 02/21/2011  . Automatic implantable cardioverter-defibrillator in situ 06/02/2009  . Hyperlipidemia 05/29/2009  . SMOKER 05/29/2009  . Essential hypertension 05/29/2009  . Aortic valve disorder 05/29/2009  . CARDIOMYOPATHY, SECONDARY 05/29/2009  . CHF 05/29/2009  . BICUSPID AORTIC VALVE 05/29/2009  . LIVER  FUNCTION TESTS, ABNORMAL, HX OF 05/29/2009  . CHOLECYSTITIS, HX OF 05/29/2009    Orientation RESPIRATION BLADDER Height & Weight     Self, Situation, Place  Normal Incontinent, External catheter Weight: 133 lb 9.6 oz (60.6 kg) Height:  5\' 4"  (162.6 cm)  BEHAVIORAL SYMPTOMS/MOOD NEUROLOGICAL BOWEL NUTRITION STATUS      Incontinent Diet  AMBULATORY STATUS COMMUNICATION OF NEEDS Skin   Limited Assist Verbally Surgical wounds                       Personal Care Assistance Level of Assistance  Bathing, Dressing Bathing Assistance: Limited assistance Feeding assistance: Limited assistance Dressing Assistance: Limited assistance     Functional Limitations Info             SPECIAL CARE FACTORS FREQUENCY  PT (By licensed PT), OT (By licensed OT)     PT Frequency: 5x/wk OT Frequency: 5x/wk            Contractures Contractures Info: Not present    Additional Factors Info  Code Status, Allergies, Psychotropic Code Status Info: Full Allergies Info: see MAR Psychotropic Info: Zoloft         Current Medications (01/01/2020):  This is the current hospital active medication list Current Facility-Administered Medications  Medication Dose Route Frequency Provider Last Rate Last Admin  . aspirin EC tablet 325 mg  325 mg Oral Q breakfast Rod Can, MD   325 mg at 01/01/20 1034  . bisacodyl (DULCOLAX) EC tablet 5 mg  5 mg Oral Daily PRN Swinteck, Aaron Edelman, MD      . carbidopa-levodopa (SINEMET IR) 25-100 MG per tablet immediate release 1 tablet  1 tablet Oral BID  Rod Can, MD   1 tablet at 01/01/20 1035  . carvedilol (COREG) tablet 12.5 mg  12.5 mg Oral BID WC Rod Can, MD   12.5 mg at 01/01/20 1034  . docusate sodium (COLACE) capsule 100 mg  100 mg Oral BID Rod Can, MD   100 mg at 01/01/20 1034  . donepezil (ARICEPT) tablet 10 mg  10 mg Oral Daily Rod Can, MD   10 mg at 01/01/20 1034  . hydrALAZINE (APRESOLINE) tablet 50 mg  50 mg Oral Q8H PRN  Rod Can, MD   50 mg at 12/30/19 0445  . irbesartan (AVAPRO) tablet 75 mg  75 mg Oral Daily Rod Can, MD   75 mg at 01/01/20 1035  . labetalol (NORMODYNE) injection 10 mg  10 mg Intravenous Q2H PRN Rod Can, MD   10 mg at 12/31/19 1423  . menthol-cetylpyridinium (CEPACOL) lozenge 3 mg  1 lozenge Oral PRN Swinteck, Aaron Edelman, MD       Or  . phenol (CHLORASEPTIC) mouth spray 1 spray  1 spray Mouth/Throat PRN Swinteck, Aaron Edelman, MD      . metoCLOPramide (REGLAN) tablet 5-10 mg  5-10 mg Oral Q8H PRN Swinteck, Aaron Edelman, MD       Or  . metoCLOPramide (REGLAN) injection 5-10 mg  5-10 mg Intravenous Q8H PRN Swinteck, Aaron Edelman, MD      . morphine 2 MG/ML injection 1-2 mg  1-2 mg Intravenous Q3H PRN Rod Can, MD   2 mg at 12/30/19 2235  . ondansetron (ZOFRAN) tablet 4 mg  4 mg Oral Q6H PRN Swinteck, Aaron Edelman, MD       Or  . ondansetron (ZOFRAN) injection 4 mg  4 mg Intravenous Q6H PRN Swinteck, Aaron Edelman, MD      . polyethylene glycol (MIRALAX / GLYCOLAX) packet 17 g  17 g Oral Daily PRN Swinteck, Aaron Edelman, MD      . rosuvastatin (CRESTOR) tablet 10 mg  10 mg Oral [id number] Rod Can, MD   10 mg at 12/30/19 1738  . traMADol (ULTRAM) tablet 50 mg  50 mg Oral Q6H PRN Rod Can, MD   50 mg at 01/01/20 6967     Discharge Medications: Please see discharge summary for a list of discharge medications.  Relevant Imaging Results:  Relevant Lab Results:   Additional Information    Sara Selvidge, LCSW

## 2020-01-02 ENCOUNTER — Encounter: Payer: Self-pay | Admitting: *Deleted

## 2020-01-02 DIAGNOSIS — R296 Repeated falls: Secondary | ICD-10-CM | POA: Diagnosis not present

## 2020-01-02 DIAGNOSIS — Z7401 Bed confinement status: Secondary | ICD-10-CM | POA: Diagnosis not present

## 2020-01-02 DIAGNOSIS — E7800 Pure hypercholesterolemia, unspecified: Secondary | ICD-10-CM | POA: Diagnosis not present

## 2020-01-02 DIAGNOSIS — F410 Panic disorder [episodic paroxysmal anxiety] without agoraphobia: Secondary | ICD-10-CM | POA: Diagnosis not present

## 2020-01-02 DIAGNOSIS — R55 Syncope and collapse: Secondary | ICD-10-CM | POA: Diagnosis not present

## 2020-01-02 DIAGNOSIS — S72032D Displaced midcervical fracture of left femur, subsequent encounter for closed fracture with routine healing: Secondary | ICD-10-CM | POA: Diagnosis not present

## 2020-01-02 DIAGNOSIS — E039 Hypothyroidism, unspecified: Secondary | ICD-10-CM | POA: Diagnosis not present

## 2020-01-02 DIAGNOSIS — Z96642 Presence of left artificial hip joint: Secondary | ICD-10-CM | POA: Diagnosis not present

## 2020-01-02 DIAGNOSIS — I350 Nonrheumatic aortic (valve) stenosis: Secondary | ICD-10-CM | POA: Diagnosis not present

## 2020-01-02 DIAGNOSIS — F0390 Unspecified dementia without behavioral disturbance: Secondary | ICD-10-CM | POA: Diagnosis not present

## 2020-01-02 DIAGNOSIS — F29 Unspecified psychosis not due to a substance or known physiological condition: Secondary | ICD-10-CM | POA: Diagnosis not present

## 2020-01-02 DIAGNOSIS — W19XXXA Unspecified fall, initial encounter: Secondary | ICD-10-CM | POA: Diagnosis not present

## 2020-01-02 DIAGNOSIS — R531 Weakness: Secondary | ICD-10-CM | POA: Diagnosis not present

## 2020-01-02 DIAGNOSIS — S72002A Fracture of unspecified part of neck of left femur, initial encounter for closed fracture: Secondary | ICD-10-CM | POA: Diagnosis not present

## 2020-01-02 DIAGNOSIS — D519 Vitamin B12 deficiency anemia, unspecified: Secondary | ICD-10-CM | POA: Diagnosis not present

## 2020-01-02 DIAGNOSIS — I10 Essential (primary) hypertension: Secondary | ICD-10-CM | POA: Diagnosis not present

## 2020-01-02 DIAGNOSIS — I509 Heart failure, unspecified: Secondary | ICD-10-CM | POA: Diagnosis not present

## 2020-01-02 DIAGNOSIS — W19XXXD Unspecified fall, subsequent encounter: Secondary | ICD-10-CM | POA: Diagnosis not present

## 2020-01-02 DIAGNOSIS — S72002D Fracture of unspecified part of neck of left femur, subsequent encounter for closed fracture with routine healing: Secondary | ICD-10-CM | POA: Diagnosis not present

## 2020-01-02 DIAGNOSIS — I951 Orthostatic hypotension: Secondary | ICD-10-CM | POA: Diagnosis not present

## 2020-01-02 DIAGNOSIS — E785 Hyperlipidemia, unspecified: Secondary | ICD-10-CM | POA: Diagnosis not present

## 2020-01-02 DIAGNOSIS — I5032 Chronic diastolic (congestive) heart failure: Secondary | ICD-10-CM | POA: Diagnosis not present

## 2020-01-02 DIAGNOSIS — G20 Parkinson's disease: Secondary | ICD-10-CM | POA: Diagnosis not present

## 2020-01-02 DIAGNOSIS — F0280 Dementia in other diseases classified elsewhere without behavioral disturbance: Secondary | ICD-10-CM | POA: Diagnosis not present

## 2020-01-02 DIAGNOSIS — D649 Anemia, unspecified: Secondary | ICD-10-CM | POA: Diagnosis not present

## 2020-01-02 DIAGNOSIS — Z4789 Encounter for other orthopedic aftercare: Secondary | ICD-10-CM | POA: Diagnosis not present

## 2020-01-02 DIAGNOSIS — Z79899 Other long term (current) drug therapy: Secondary | ICD-10-CM | POA: Diagnosis not present

## 2020-01-02 DIAGNOSIS — I255 Ischemic cardiomyopathy: Secondary | ICD-10-CM | POA: Diagnosis not present

## 2020-01-02 DIAGNOSIS — M25552 Pain in left hip: Secondary | ICD-10-CM | POA: Diagnosis not present

## 2020-01-02 DIAGNOSIS — Z95810 Presence of automatic (implantable) cardiac defibrillator: Secondary | ICD-10-CM | POA: Diagnosis not present

## 2020-01-02 DIAGNOSIS — E559 Vitamin D deficiency, unspecified: Secondary | ICD-10-CM | POA: Diagnosis not present

## 2020-01-02 DIAGNOSIS — M2550 Pain in unspecified joint: Secondary | ICD-10-CM | POA: Diagnosis not present

## 2020-01-02 LAB — BASIC METABOLIC PANEL
Anion gap: 7 (ref 5–15)
BUN: 23 mg/dL (ref 8–23)
CO2: 25 mmol/L (ref 22–32)
Calcium: 9.2 mg/dL (ref 8.9–10.3)
Chloride: 105 mmol/L (ref 98–111)
Creatinine, Ser: 0.77 mg/dL (ref 0.44–1.00)
GFR calc Af Amer: >60 mL/min (ref >60)
GFR calc non Af Amer: >60 mL/min (ref >60)
Glucose, Bld: 104 mg/dL — ABNORMAL HIGH (ref 70–99)
Potassium: 3.8 mmol/L (ref 3.5–5.1)
Sodium: 137 mmol/L (ref 135–145)

## 2020-01-02 LAB — CBC
HCT: 32.1 % — ABNORMAL LOW (ref 36.0–46.0)
Hemoglobin: 10.2 g/dL — ABNORMAL LOW (ref 12.0–15.0)
MCH: 28.6 pg (ref 26.0–34.0)
MCHC: 31.8 g/dL (ref 30.0–36.0)
MCV: 89.9 fL (ref 80.0–100.0)
Platelets: 124 10*3/uL — ABNORMAL LOW (ref 150–400)
RBC: 3.57 MIL/uL — ABNORMAL LOW (ref 3.87–5.11)
RDW: 15.3 % (ref 11.5–15.5)
WBC: 8.9 10*3/uL (ref 4.0–10.5)
nRBC: 0 % (ref 0.0–0.2)

## 2020-01-02 MED ORDER — ACETAMINOPHEN 325 MG PO TABS
650.0000 mg | ORAL_TABLET | Freq: Three times a day (TID) | ORAL | Status: DC
  Administered 2020-01-02: 650 mg via ORAL
  Filled 2020-01-02: qty 2

## 2020-01-02 MED ORDER — ACETAMINOPHEN 325 MG PO TABS: 650.0000 mg | ORAL_TABLET | Freq: Three times a day (TID) | ORAL | 0 refills | Status: DC

## 2020-01-02 MED ORDER — OXYCODONE HCL 5 MG PO TABS: 2.5000 mg | ORAL_TABLET | ORAL | 0 refills | Status: DC | PRN

## 2020-01-02 MED ORDER — OXYCODONE HCL 5 MG PO TABS
2.5000 mg | ORAL_TABLET | ORAL | Status: DC | PRN
  Administered 2020-01-02: 2.5 mg via ORAL
  Filled 2020-01-02: qty 1

## 2020-01-02 MED ORDER — CARBIDOPA-LEVODOPA 25-100 MG PO TABS: 1.0000 | ORAL_TABLET | Freq: Three times a day (TID) | ORAL | 0 refills | Status: DC

## 2020-01-02 MED ORDER — DONEPEZIL HCL 10 MG PO TABS: 10.0000 mg | ORAL_TABLET | Freq: Every day | ORAL | 0 refills | Status: DC

## 2020-01-02 MED ORDER — POLYETHYLENE GLYCOL 3350 17 G PO PACK: 17.0000 g | PACK | Freq: Every day | ORAL | 0 refills | Status: AC

## 2020-01-02 MED ORDER — CARBIDOPA-LEVODOPA 25-100 MG PO TABS
1.0000 | ORAL_TABLET | Freq: Three times a day (TID) | ORAL | Status: DC
  Administered 2020-01-02: 1 via ORAL
  Filled 2020-01-02: qty 1

## 2020-01-02 NOTE — TOC Transition Note (Addendum)
Transition of Care [facility]) - CM/SW Discharge Note   Patient Details  Name: [name] MRN: [id number] Date of Birth: 12-29-1955  [facility]) CM/SW Contact:  Lia Hopping, [place] Phone Number: 01/02/2020, 3:54 PM   Clinical Narrative:    Patient accepted at [facility].  Patient approved Auth # [id number] approved for 5 days.  PTAR approved to transport by [place] notified.-Per Medical Tech at [facility] the patient is due for her second vaccine on Monday at [facility] at 1:10pm.  Nurse call report to: [phone] PTAR arranged to transport.     Final next level of care: Skilled Nursing Facility Barriers to Discharge: Barriers Resolved   Patient Goals and CMS Choice Patient states their goals for this hospitalization and ongoing recovery are:: rehab at [facility] CMS Medicare.gov Compare Post Acute Care list provided to:: Other (Comment Required)(Son) Choice offered to / list presented to : Patient, Adult Children  Discharge Placement PASRR number recieved: 01/01/20            Patient chooses bed at: [place], [place] Patient to be transferred to facility by: [place] Name of family member notified: Son, [name] Patient and family notified of of transfer: 01/02/20  Discharge Plan and Services     Post Acute Care Choice: (Memory Care)          DME Arranged: N/A DME Agency: NA       HH Arranged: NA HH Agency: NA        Social Determinants of Health (SDOH) Interventions     Readmission Risk Interventions Readmission Risk Prevention Plan 01/02/2020 12/25/2019  Transportation Screening Complete Complete  PCP or Specialist Appt within 5-7 Days Complete Complete  Home Care Screening Complete Complete  Medication Review (RN CM) Complete Complete  Some recent data might be hidden

## 2020-01-02 NOTE — Consult Note (Signed)
Palliative Care Consultation Note Reason: Frequent Readmissions, Falls  Patient Narrative:  This patient is a 64 year old woman who was diagnosed with Parkinson's Dementia 3 years ago, has chronic heart failure, chronic kidney disease and is recently status post AICD placement with AVR for aortic stenosis.  She has had issues with very labile blood pressure, likely related to autonomic dysfunction in the setting of progressive Parkinson's disease.  Despite AVR she is continued to have multiple falls, gait disturbance and multiple hospitalizations -mostly for injuries related to mechanical falls.She was admitted this hospitalization for an acute hip fracture.  Patient seen with her brother at the bedside, and I spoke with her niece by phone, her son was at work and could not speak with me but is going to come over later or is open to me calling him back.  Exam: patient has flat affect, she has slow very delayed speech but will answer simple questions when asked and given sufficient enough time to respond. She has significant rigidity, intention tremor and cog wheeling of upper extremities, slowness and confusion.  Goals of Care:  Patient's family have been increasingly concerned about her QOL and level of suffering. They have seen her deteriorate rapidly over the past few months. It was a very difficult decision to place her into memory care a few months ago but her son could not care for her at home. Her brother and Niece are very involved in her care and her son is her primary Media planner.   I discussed with them her prognosis and possible disease trajectory with progressive PD Dememtia, autonomic dysfunction and severe gait disturbance.    They want to focus on pain control and comfort as the first and most important goal.  They do not believe that she will be able to rehab, they asked me "why rehab since her PD is getting worse and she will continue to fall with mobility- they can fix the  PD and trying to ambulate her only puts her a more risk for future falls and discomfort".  Per her niece Dewaine Oats, the patient "started but never completed her ACP documents" at the time she was diagnosed with early onset dementia.  I discussed with the family if the goals are comfort that a DNR order is most appropriate. Will complete a MOST form with her son when he arrives.  I introduced the concept of hospice care - she would likely qualify at this point.  Care Coordination:  I have discussed her care plan with the family. Her health plan will cover skilled care for pain management which is what she needs at this point- I do not think rehab will be effective in the setting of progressive PD. Spoke with admissions director at [facility] and they will take this patient given her specialized needs-her insurance company has authorized this level of care.  Will transfer to [facility] today.  Symptom Management Recommendations:  1. Increased Levadopa, may be "off" period contributing to her autonomic instability and worsening PD symptoms 2. Scheduled Tylenol for pain TID 3. Oxycodone for pain prn- will need frequent pain assessments at [facility] [place] [phone]  Time: 70 min Greater than 50%  of this time was spent counseling and coordinating care related to the above assessment and plan.

## 2020-01-02 NOTE — Progress Notes (Signed)
Notified pt and patient family that PTAR will be transporting her to [place] facility.

## 2020-01-02 NOTE — TOC Progression Note (Addendum)
Transition of Care [facility]) - Progression Note    Patient Details  Name: [name] MRN: [id number] Date of Birth: May 27, 1956  Transition of Care [facility]) CM/SW [place], [place] Phone Number: 01/02/2020, 9:36 AM  Clinical Narrative:    CSW reached out to the patient son this am to provide bed offers, patient ready to d/c today, left voicemail to return call.  Patient son chose [facility] SNF for rehab.  HTA authorization pending. Patient will transport by PTAR.    Expected Discharge Plan: Memory Care Barriers to Discharge: Continued Medical Work up  Expected Discharge Plan and Services Expected Discharge Plan: Memory Care     Post Acute Care Choice: (Memory Care) Living arrangements for the past 2 months: [place] Expected Discharge Date: (unknown)                                     Social Determinants of Health (SDOH) Interventions    Readmission Risk Interventions Readmission Risk Prevention Plan 12/25/2019  Transportation Screening Complete  PCP or Specialist Appt within 5-7 Days Complete  Home Care Screening Complete  Medication Review (RN CM) Complete  Some recent data might be hidden

## 2020-01-02 NOTE — Care Management Important Message (Signed)
Important Message  Patient Details IM Letter given to [place] Case Manager to present to the Patient Name: JONATHON CASTELO MRN: [id number] Date of Birth: September 19, 1956   Medicare Important Message Given:  Yes     Kerin Salen 01/02/2020, 11:03 AM

## 2020-01-02 NOTE — Discharge Summary (Addendum)
Physician Discharge Summary  [name] QZE:[id number] DOB: 03/13/1956 DOA: 12/29/2019  PCP: Lujean Amel, MD  Admit date: 12/29/2019 Discharge date: 01/02/2020  Admitted From: Assisted living facility  Disposition:  Memory care  Recommendations for Outpatient Follow-up and new medication changes:  1. Follow up with Dr. Dorthy Cooler in 7 days.  2. Patient placed on aspirin for DVT prophylaxis 3. Pain control with scheduled acetaminophen and as needed oxycodone. 4. Bowel regimen with Miralax.  5. WBAT with walker.  6. Sinemet increased to tid from bid.   Home Health: na   Equipment/Devices: na    Discharge Condition: stable  CODE STATUS: full  Diet recommendation: heart healthy   Brief/Interim Summary: Patient was admitted to the hospital with a working diagnosis of acute displaced subcapital/transcervical fracture over the proximal left femur.  64 year old female who presented with left hip pain after mechanical fall. She does have significant past medical history for Parkinson's disease, dementia, aortic stenosis status post AVR, nonischemic cardiomyopathy status post AICD, hypertension and orthostatic hypotension. Patient had recent hospitalization for ambulatory dysfunction, discharged to assisted living facility. Apparently she continued to have multiple falls. On her initial physical examination her blood pressure was 161/120, heart rate 93, respiratory rate 16, oxygen saturation 97%, her lungs were clear to auscultation bilaterally, heart S1-S2 present rhythmic, soft abdomen, no lower extremity edema, left leg was shortened and externally rotated. Sodium 139, potassium 4.1, chloride 108, bicarb 24, glucose 125, BUN 21, creatinine 1.12, white count 8.0, hemoglobin 15.2, hematocrit 46.5, platelets 132.  SARS COVID-19 was negative.  Urine analysis more than 300 protein, specific gravity 1.013, 0-5 white cells, 0-5 red cells.  Head and cervical CT spine negative for acute changes.  Her  chest radiograph had no infiltrates.  EKG 100 bpm, right axis deviation, prolonged QRS, sinus rhythm with ventricular pacing no ST segment or significant T wave changes X-rays show acute displaced subcapital/transcervical fracture of the left proximal femur.  Patient received supportive medical care with analgesics and dvt prophylaxis. Orthopedics was consulted and patient underwent left total hip arthroplasty, with good toleration.  1.  Acute left subcapital/transcervical proximal femur fracture.  Patient was admitted was admitted to the medical ward, responded well to medical therapy with analgesics and DVT prophylaxis.  Patient was evaluated by orthopedics, and she underwent left total hip arthroplasty.  Postoperatively she was seen by physical therapy, recommendations to continue.  At the skilled nursing facility, memory care unit.  She will continue DVT prophylaxis with aspirin, and pain control with oxycodone.  2.  Hypertension/dyslipidemia.  Her blood pressure remained well controlled during her hospitalization, continue carvedilol and olmesartan.   3.  Nonischemic cardiomyopathy/status post AICD/chronic diastolic heart failure.  It remained stable with no signs of exacerbation, her last echocardiogram is from December 2020, her left ventricle ejection fraction was 55 to 60%.  Patient will continue taking furosemide, carvedilol and olmesartan.  Follow-up with cardiology as an outpatient.  5.  Parkinson's disease/dementia.  Patient will continue Sinemet tid and donepezil.  Continue physical therapy at skilled nursing facility. Continue with sertraline.   6.  Acute kidney injury.  Patient had transitory elevation of creatinine up to 1.12, nephrotoxic agents were held along with diuretics.  Her discharge creatinine 0.77, potassium 3.8 and serum bicarbonate 25.     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 What changed:   how much to take  when to take this  reasons to take this   aspirin 325 MG EC tablet Take 1 tablet (325 mg total) by mouth daily with breakfast. What changed:   medication strength  how much to take  when to take this   carbidopa-levodopa 25-100 MG tablet Commonly known as: SINEMET IR Take 1 tablet by mouth 3 (three) times daily. What changed:   how much to take  how to take this  when to take this  additional instructions   carvedilol 12.5 MG tablet Commonly known as: COREG Take 1 tablet by mouth 2 (two) times a day. With food   donepezil 10 MG tablet Commonly known as: ARICEPT Take 1 tablet (10 mg total) by mouth daily.   furosemide 20 MG tablet Commonly known as: LASIX Take 1 tablet by mouth daily.   guaifenesin 100 MG/5ML syrup Commonly known as: ROBITUSSIN Take 200 mg by mouth 4 (four) times daily as needed for cough.   loperamide 2 MG capsule Commonly known as: IMODIUM Take 2 mg by mouth as needed for diarrhea or loose stools.   magnesium hydroxide 400 MG/5ML suspension Commonly known as: MILK OF MAGNESIA Take 30 mLs by mouth at bedtime as needed for mild constipation.   multivitamin with minerals Tabs tablet Take 1 tablet by mouth daily.   olmesartan 20 MG tablet Commonly known as: BENICAR Take 20 mg by mouth daily.   oxyCODONE 5 MG immediate release tablet Commonly known as: Oxy IR/ROXICODONE Take 0.5 tablets (2.5 mg total) by mouth every 4 (four)  hours as needed for moderate pain.   polyethylene glycol 17 g packet Commonly known as: MIRALAX / GLYCOLAX Take 17 g by mouth daily.   rosuvastatin 10 MG tablet Commonly known as: CRESTOR Take 1 tablet (10 mg total) by mouth daily.   sertraline 100 MG tablet Commonly known as: ZOLOFT Take 1 tablet (100 mg total) by mouth daily. What changed:   when to take this  reasons to take this   SYSTANE OP Place 1-2 drops into both eyes 3 (three) times daily as needed (for dry eyes.).      Follow-up Information    Swinteck, Aaron Edelman, MD. Schedule an appointment as soon as possible for a visit in 2 weeks.   Specialty: Orthopedic Surgery Why: For wound re-check Contact information: [address] [place] [address] [phone]          Allergies  Allergen Reactions  . Trospium Chloride Er Other (See Comments)    Constipation     Consultations:  Orthopedics    Procedures/Studies: DG Chest 1 View  Result Date: 12/29/2019 CLINICAL DATA:  64 year old female with fall and hip pain. EXAM: CHEST  1 VIEW COMPARISON:  Chest radiograph dated 12/22/2019. FINDINGS: The lungs are clear. There is no pleural effusion or pneumothorax. The cardiac silhouette is within normal limits. Median sternotomy wires, left pectoral AICD device, and cardiac valve replacement. Atherosclerotic calcification of the aorta. No acute osseous pathology. Right upper quadrant cholecystectomy clips. IMPRESSION: No  acute cardiopulmonary process. Electronically Signed   By: Anner Crete M.D.   On: 12/29/2019 22:41   CT HEAD WO CONTRAST  Result Date: 12/29/2019 CLINICAL DATA:  64 year old female with head trauma. EXAM: CT HEAD WITHOUT CONTRAST CT CERVICAL SPINE WITHOUT CONTRAST TECHNIQUE: Multidetector CT imaging of the head and cervical spine was performed following the standard protocol without intravenous contrast. Multiplanar CT image reconstructions of the cervical spine were also generated.  COMPARISON:  Head CT dated 12/22/2019. FINDINGS: CT HEAD FINDINGS Brain: Moderate age-related atrophy and chronic microvascular ischemic changes. There is no acute intracranial hemorrhage. No mass effect or midline shift. No extra-axial fluid collection. Vascular: No hyperdense vessel or unexpected calcification. Skull: Normal. Negative for fracture or focal lesion. Sinuses/Orbits: The visualized paranasal sinuses are clear. There is opacification of several mastoid air cells bilaterally. Other: None CT CERVICAL SPINE FINDINGS Alignment: No acute subluxation. Skull base and vertebrae: No acute fracture. Soft tissues and spinal canal: No prevertebral fluid or swelling. No visible canal hematoma. Disc levels:  Mild degenerative changes. Upper chest: Negative. Other: Bilateral carotid bulb calcified plaques. IMPRESSION: 1. No acute intracranial pathology. 2. No acute/traumatic cervical spine pathology. Electronically Signed   By: Anner Crete M.D.   On: 12/29/2019 22:26   CT Head Wo Contrast  Result Date: 12/22/2019 CLINICAL DATA:  Headache EXAM: CT HEAD WITHOUT CONTRAST TECHNIQUE: Contiguous axial images were obtained from the base of the skull through the vertex without intravenous contrast. COMPARISON:  06/13/2018 FINDINGS: Brain: No evidence of acute infarction, hemorrhage, hydrocephalus, extra-axial collection or mass lesion/mass effect. Remote left basal ganglia lacunar infarct. Extensive low-density changes within the periventricular and subcortical white matter compatible with chronic microvascular ischemic change. Mild-moderate diffuse cerebral volume loss. Vascular: Atherosclerotic calcifications involving the large vessels of the skull base. No unexpected hyperdense vessel. Skull: Normal. Negative for fracture or focal lesion. Sinuses/Orbits: Partial opacification involving the left mastoid air cells. Right mastoid air cells are clear. Paranasal sinuses are clear. Orbital structures unremarkable.  Other: None. IMPRESSION: 1. No acute intracranial findings. 2. Chronic microvascular ischemic changes and cerebral volume loss, similar to previous study. 3. Partial opacification involving the left mastoid air cells. Correlate for signs or symptoms of mastoiditis. Electronically Signed   By: Davina Poke D.O.   On: 12/22/2019 13:41   CT CERVICAL SPINE WO CONTRAST  Result Date: 12/29/2019 CLINICAL DATA:  64 year old female with head trauma. EXAM: CT HEAD WITHOUT CONTRAST CT CERVICAL SPINE WITHOUT CONTRAST TECHNIQUE: Multidetector CT imaging of the head and cervical spine was performed following the standard protocol without intravenous contrast. Multiplanar CT image reconstructions of the cervical spine were also generated. COMPARISON:  Head CT dated 12/22/2019. FINDINGS: CT HEAD FINDINGS Brain: Moderate age-related atrophy and chronic microvascular ischemic changes. There is no acute intracranial hemorrhage. No mass effect or midline shift. No extra-axial fluid collection. Vascular: No hyperdense vessel or unexpected calcification. Skull: Normal. Negative for fracture or focal lesion. Sinuses/Orbits: The visualized paranasal sinuses are clear. There is opacification of several mastoid air cells bilaterally. Other: None CT CERVICAL SPINE FINDINGS Alignment: No acute subluxation. Skull base and vertebrae: No acute fracture. Soft tissues and spinal canal: No prevertebral fluid or swelling. No visible canal hematoma. Disc levels:  Mild degenerative changes. Upper chest: Negative. Other: Bilateral carotid bulb calcified plaques. IMPRESSION: 1. No acute intracranial pathology. 2. No acute/traumatic cervical spine pathology. Electronically Signed   By: Anner Crete M.D.   On: 12/29/2019 22:26   CT Cervical Spine Wo Contrast  Result Date:  12/22/2019 CLINICAL DATA:  Head trauma EXAM: CT CERVICAL SPINE WITHOUT CONTRAST TECHNIQUE: Multidetector CT imaging of the cervical spine was performed without intravenous  contrast. Multiplanar CT image reconstructions were also generated. COMPARISON:  None. FINDINGS: Alignment: Normal alignment of the facet joints without dislocation. Dens and lateral masses remain aligned. Preserved cervical lordosis without static listhesis. Skull base and vertebrae: No acute fracture. No primary bone lesion or focal pathologic process. Soft tissues and spinal canal: No prevertebral fluid or swelling. No visible canal hematoma. Disc levels: Intervertebral disc spaces are maintained. Mild multilevel facet arthrosis, most pronounced at C3-4 and C4-5 on the right and C4-5 on the left. Upper chest: Visualized lung apices clear. Other: 9 mm low-density right thyroid lobe nodule. No follow-up recommended (ref: J Am Coll Radiol. 2015 Feb;12(2): 143-50). IMPRESSION: 1. No acute fracture or static listhesis. 2. Mild cervical spondylosis. Electronically Signed   By: Davina Poke D.O.   On: 12/22/2019 13:46   Pelvis Portable  Result Date: 12/31/2019 CLINICAL DATA:  64 year old female status post left hip arthroplasty. EXAM: PORTABLE PELVIS 1-2 VIEWS COMPARISON:  Intraoperative fluoroscopic study dated 12/31/2019. FINDINGS: There is a total left hip arthroplasty. The arthroplasty components appear intact and in anatomic alignment. There is no acute fracture or dislocation. Mild right hip arthritic changes. Vascular calcifications noted. Postsurgical changes in the soft tissues of the left hip. IMPRESSION: Status post total left hip arthroplasty. No complication. Electronically Signed   By: Anner Crete M.D.   On: 12/31/2019 20:18   DG CHEST PORT 1 VIEW  Result Date: 12/22/2019 CLINICAL DATA:  Altered mental status with fall and episode of unconsciousness. EXAM: PORTABLE CHEST 1 VIEW COMPARISON:  Radiographs 09/04/2019 and 12/20/2016. FINDINGS: The heart size and mediastinal contours are stable status post median sternotomy and aortic valve replacement. Left subclavian AICD leads appear  unchanged. The lungs are clear. There is no pleural effusion or pneumothorax. No acute osseous findings. Telemetry leads overlie the chest. IMPRESSION: Stable chest.  No active cardiopulmonary process. Electronically Signed   By: Richardean Sale M.D.   On: 12/22/2019 16:33   DG C-Arm 1-60 Min-No Report  Result Date: 12/31/2019 Fluoroscopy was utilized by the requesting physician.  No radiographic interpretation.   DG HIP OPERATIVE UNILAT W OR W/O PELVIS LEFT  Result Date: 12/31/2019 CLINICAL DATA:  Left hip replacement. EXAM: OPERATIVE LEFT HIP (WITH PELVIS IF PERFORMED)  VIEWS TECHNIQUE: Fluoroscopic spot image(s) were submitted for interpretation post-operatively. COMPARISON:  None. FINDINGS: A total left hip replacement is seen. There is no evidence of surrounding lucency to suggest the presence of hardware loosening or infection. The remaining visualized osseous and soft tissue structures are unremarkable. IMPRESSION: Left hip replacement without evidence of hardware complication. Electronically Signed   By: Virgina Norfolk M.D.   On: 12/31/2019 19:25   DG Hip Unilat With Pelvis 2-3 Views Left  Result Date: 12/29/2019 CLINICAL DATA:  Pain status post fall EXAM: DG HIP (WITH OR WITHOUT PELVIS) 2-3V LEFT COMPARISON:  None. FINDINGS: There is an acute displaced subcapital/transcervical fracture of the proximal left femur. There is no dislocation. The osseous mineralization is slightly decreased. IMPRESSION: Acute displaced subcapital/transcervical fracture of the proximal left femur. Electronically Signed   By: Constance Holster M.D.   On: 12/29/2019 22:39   DG Knee 3 Views Left  Result Date: 12/30/2019 CLINICAL DATA:  Fall.  Pain. EXAM: LEFT KNEE - 3 VIEW COMPARISON:  None. FINDINGS: No fracture. No subluxation or dislocation. No joint effusion. Meniscal mineralization evident. IMPRESSION:  Negative. Electronically Signed   By: Misty Stanley M.D.   On: 12/30/2019 09:20      Procedures:  left  total hip arthroplasty, with good toleration.  Subjective: Patient is feeling netter, left hip pain is improved with analgesics, no nausea or vomiting, no chest pain or dyspnea.   Discharge Exam: Vitals:   01/01/20 2115 01/02/20 0431  BP: 122/75 (!) 155/93  Pulse: 86 85  Resp: 15 16  Temp: 98.6 F (37 C) 98.5 F (36.9 C)  SpO2: 99% 100%   Vitals:   01/01/20 1824 01/01/20 2115 01/02/20 0431 01/02/20 0511  BP: 111/75 122/75 (!) 155/93   Pulse: 84 86 85   Resp: 16 15 16    Temp: 97.8 F (36.6 C) 98.6 F (37 C) 98.5 F (36.9 C)   TempSrc: Oral Oral Oral   SpO2: 98% 99% 100%   Weight:    60.9 kg  Height:        General: Not in pain or dyspnea.  Neurology: Awake and alert, non focal  E ENT: no pallor, no icterus, oral mucosa moist Cardiovascular: No JVD. S1-S2 present, rhythmic, no gallops, rubs, or murmurs. No lower extremity edema. Pulmonary: positive breath sounds bilaterally, adequate air movement, no wheezing, rhonchi or rales. Gastrointestinal. Abdomen with, no organomegaly, non tender, no rebound or guarding Skin. No rashes Musculoskeletal: no joint deformities   The results of significant diagnostics from this hospitalization (including imaging, microbiology, ancillary and laboratory) are listed below for reference.     Microbiology: Recent Results (from the past 240 hour(s))  Respiratory Panel by RT PCR (Flu A&B, Covid) - Urine, Clean Catch     Status: None   Collection Time: 12/30/19 12:16 AM   Specimen: Urine, Clean Catch  Result Value Ref Range Status   SARS Coronavirus 2 by RT PCR NEGATIVE NEGATIVE Final    Comment: (NOTE) SARS-CoV-2 target nucleic acids are NOT DETECTED. The SARS-CoV-2 RNA is generally detectable in upper respiratoy specimens during the acute phase of infection. The lowest concentration of SARS-CoV-2 viral copies this assay can detect is 131 copies/mL. A negative result does not preclude SARS-Cov-2 infection and should not be used as  the sole basis for treatment or other patient management decisions. A negative result may occur with  improper specimen collection/handling, submission of specimen other than nasopharyngeal swab, presence of viral mutation(s) within the areas targeted by this assay, and inadequate number of viral copies (<131 copies/mL). A negative result must be combined with clinical observations, patient history, and epidemiological information. The expected result is Negative. Fact Sheet for Patients:  PinkCheek.be Fact Sheet for Healthcare Providers:  GravelBags.it This test is not yet ap proved or cleared by the [place] FDA and  has been authorized for detection and/or diagnosis of SARS-CoV-2 by FDA under an Emergency Use Authorization (EUA). This EUA will remain  in effect (meaning this test can be used) for the duration of the COVID-19 declaration under Section 564(b)(1) of the Act, 21 U.S.C. section 360bbb-3(b)(1), unless the authorization is terminated or revoked sooner.    Influenza A by PCR NEGATIVE NEGATIVE Final   Influenza B by PCR NEGATIVE NEGATIVE Final    Comment: (NOTE) The Xpert Xpress SARS-CoV-2/FLU/RSV assay is intended as an aid in  the diagnosis of influenza from Nasopharyngeal swab specimens and  should not be used as a sole basis for treatment. Nasal washings and  aspirates are unacceptable for Xpert Xpress SARS-CoV-2/FLU/RSV  testing. Fact Sheet for Patients: PinkCheek.be Fact Sheet for Healthcare  Providers: GravelBags.it This test is not yet approved or cleared by the [place] and  has been authorized for detection and/or diagnosis of SARS-CoV-2 by  FDA under an Emergency Use Authorization (EUA). This EUA will remain  in effect (meaning this test can be used) for the duration of the  Covid-19 declaration under Section 564(b)(1) of the Act, 21   U.S.C. section 360bbb-3(b)(1), unless the authorization is  terminated or revoked. Performed at [facility], [place] [address] [place], [place] [address]   Surgical pcr screen     Status: None   Collection Time: 12/30/19 12:28 PM   Specimen: Nasal Mucosa; Nasal Swab  Result Value Ref Range Status   MRSA, PCR NEGATIVE NEGATIVE Final   Staphylococcus aureus NEGATIVE NEGATIVE Final    Comment: (NOTE) The Xpert SA Assay (FDA approved for NASAL specimens in patients 14 years of age and older), is one component of a comprehensive surveillance program. It is not intended to diagnose infection nor to guide or monitor treatment. Performed at [facility], [place] [address] [place], [place] [address]      Labs: BNP (last 3 results) No results for input(s): BNP in the last 8760 hours. Basic Metabolic Panel: Recent Labs  Lab 12/29/19 2248 12/30/19 0758 12/31/19 0337 01/01/20 0338 01/02/20 0334  NA 139 137 137 135 137  K 4.1 3.7 4.1 4.3 3.8  CL 108 107 109 105 105  CO2 24 20* 20* 23 25  GLUCOSE 124* 117* 95 133* 104*  BUN 21 16 15 16 23   CREATININE 1.12* 0.69 0.64 0.92 0.77  CALCIUM 10.5* 10.2 9.7 9.2 9.2  MG  --   --  1.8  --   --    Liver Function Tests: Recent Labs  Lab 12/31/19 0337  AST 28  ALT 17  ALKPHOS 73  BILITOT 0.8  PROT 6.7  ALBUMIN 3.4*   No results for input(s): LIPASE, AMYLASE in the last 168 hours. No results for input(s): AMMONIA in the last 168 hours. CBC: Recent Labs  Lab 12/29/19 2248 12/30/19 0758 12/31/19 0337 01/01/20 0338 01/02/20 0334  WBC 8.0 6.0 6.3 6.3 8.9  NEUTROABS 6.1  --   --   --   --   HGB 15.2* 13.9 13.0 10.5* 10.2*  HCT 46.5* 41.8 39.9 33.4* 32.1*  MCV 88.7 86.2 88.5 91.3 89.9  PLT 132* 117* 102* 99* 124*   Cardiac Enzymes: No results for input(s): CKTOTAL, CKMB, CKMBINDEX, TROPONINI in the last 168 hours. BNP: Invalid input(s): POCBNP CBG: No results for input(s): GLUCAP in the  last 168 hours. D-Dimer No results for input(s): DDIMER in the last 72 hours. Hgb A1c No results for input(s): HGBA1C in the last 72 hours. Lipid Profile No results for input(s): CHOL, HDL, LDLCALC, TRIG, CHOLHDL, LDLDIRECT in the last 72 hours. Thyroid function studies No results for input(s): TSH, T4TOTAL, T3FREE, THYROIDAB in the last 72 hours.  Invalid input(s): FREET3 Anemia work up No results for input(s): VITAMINB12, FOLATE, FERRITIN, TIBC, IRON, RETICCTPCT in the last 72 hours. Urinalysis    Component Value Date/Time   COLORURINE YELLOW 12/30/2019 0016   APPEARANCEUR CLEAR 12/30/2019 0016   LABSPEC 1.013 12/30/2019 0016   PHURINE 8.0 12/30/2019 0016   GLUCOSEU NEGATIVE 12/30/2019 0016   HGBUR SMALL (A) 12/30/2019 0016   BILIRUBINUR NEGATIVE 12/30/2019 0016   KETONESUR NEGATIVE 12/30/2019 0016   PROTEINUR >=300 (A) 12/30/2019 0016   NITRITE NEGATIVE 12/30/2019 0016   LEUKOCYTESUR NEGATIVE 12/30/2019  0016   Sepsis Labs Invalid input(s): PROCALCITONIN,  WBC,  LACTICIDVEN Microbiology Recent Results (from the past 240 hour(s))  Respiratory Panel by RT PCR (Flu A&B, Covid) - Urine, Clean Catch     Status: None   Collection Time: 12/30/19 12:16 AM   Specimen: Urine, Clean Catch  Result Value Ref Range Status   SARS Coronavirus 2 by RT PCR NEGATIVE NEGATIVE Final    Comment: (NOTE) SARS-CoV-2 target nucleic acids are NOT DETECTED. The SARS-CoV-2 RNA is generally detectable in upper respiratoy specimens during the acute phase of infection. The lowest concentration of SARS-CoV-2 viral copies this assay can detect is 131 copies/mL. A negative result does not preclude SARS-Cov-2 infection and should not be used as the sole basis for treatment or other patient management decisions. A negative result may occur with  improper specimen collection/handling, submission of specimen other than nasopharyngeal swab, presence of viral mutation(s) within the areas targeted by this  assay, and inadequate number of viral copies (<131 copies/mL). A negative result must be combined with clinical observations, patient history, and epidemiological information. The expected result is Negative. Fact Sheet for Patients:  PinkCheek.be Fact Sheet for Healthcare Providers:  GravelBags.it This test is not yet ap proved or cleared by the [place] FDA and  has been authorized for detection and/or diagnosis of SARS-CoV-2 by FDA under an Emergency Use Authorization (EUA). This EUA will remain  in effect (meaning this test can be used) for the duration of the COVID-19 declaration under Section 564(b)(1) of the Act, 21 U.S.C. section 360bbb-3(b)(1), unless the authorization is terminated or revoked sooner.    Influenza A by PCR NEGATIVE NEGATIVE Final   Influenza B by PCR NEGATIVE NEGATIVE Final    Comment: (NOTE) The Xpert Xpress SARS-CoV-2/FLU/RSV assay is intended as an aid in  the diagnosis of influenza from Nasopharyngeal swab specimens and  should not be used as a sole basis for treatment. Nasal washings and  aspirates are unacceptable for Xpert Xpress SARS-CoV-2/FLU/RSV  testing. Fact Sheet for Patients: PinkCheek.be Fact Sheet for Healthcare Providers: GravelBags.it This test is not yet approved or cleared by the [place] FDA and  has been authorized for detection and/or diagnosis of SARS-CoV-2 by  FDA under an Emergency Use Authorization (EUA). This EUA will remain  in effect (meaning this test can be used) for the duration of the  Covid-19 declaration under Section 564(b)(1) of the Act, 21  U.S.C. section 360bbb-3(b)(1), unless the authorization is  terminated or revoked. Performed at [facility], [place] [address], [place], [place] [address]   Surgical pcr screen     Status: None   Collection Time: 12/30/19 12:28 PM    Specimen: Nasal Mucosa; Nasal Swab  Result Value Ref Range Status   MRSA, PCR NEGATIVE NEGATIVE Final   Staphylococcus aureus NEGATIVE NEGATIVE Final    Comment: (NOTE) The Xpert SA Assay (FDA approved for NASAL specimens in patients 62 years of age and older), is one component of a comprehensive surveillance program. It is not intended to diagnose infection nor to guide or monitor treatment. Performed at [facility], [place] [address] [place], [place] [address]      Time coordinating discharge: 45 minutes  SIGNED:   Tawni Millers, MD  [facility] 01/02/2020, 9:48 AM

## 2020-01-02 NOTE — Plan of Care (Signed)
  Problem: Education: Goal: Knowledge of General Education information will improve Description: Including pain rating scale, medication(s)/side effects and non-pharmacologic comfort measures Outcome: Adequate for Discharge   Problem: Health Behavior/Discharge Planning: Goal: Ability to manage health-related needs will improve Outcome: Adequate for Discharge   Problem: Clinical Measurements: Goal: Ability to maintain clinical measurements within normal limits will improve Outcome: Adequate for Discharge Goal: Will remain free from infection Outcome: Adequate for Discharge Goal: Diagnostic test results will improve Outcome: Adequate for Discharge Goal: Cardiovascular complication will be avoided Outcome: Adequate for Discharge   Problem: Education: Goal: Knowledge of General Education information will improve Description: Including pain rating scale, medication(s)/side effects and non-pharmacologic comfort measures Outcome: Adequate for Discharge   Problem: Coping: Goal: Level of anxiety will decrease Outcome: Adequate for Discharge   Problem: Nutrition: Goal: Adequate nutrition will be maintained Outcome: Adequate for Discharge   Problem: Activity: Goal: Risk for activity intolerance will decrease Outcome: Adequate for Discharge   Problem: Elimination: Goal: Will not experience complications related to bowel motility Outcome: Adequate for Discharge Goal: Will not experience complications related to urinary retention Outcome: Adequate for Discharge   Problem: Pain Managment: Goal: General experience of comfort will improve Outcome: Adequate for Discharge   Problem: Safety: Goal: Ability to remain free from injury will improve Outcome: Adequate for Discharge   Problem: Skin Integrity: Goal: Risk for impaired skin integrity will decrease Outcome: Adequate for Discharge   Problem: Education: Goal: Knowledge of General Education information will improve Description:  Including pain rating scale, medication(s)/side effects and non-pharmacologic comfort measures Outcome: Adequate for Discharge   Problem: Health Behavior/Discharge Planning: Goal: Ability to manage health-related needs will improve Outcome: Adequate for Discharge   Problem: Clinical Measurements: Goal: Ability to maintain clinical measurements within normal limits will improve Outcome: Adequate for Discharge Goal: Will remain free from infection Outcome: Adequate for Discharge Goal: Diagnostic test results will improve Outcome: Adequate for Discharge Goal: Respiratory complications will improve Outcome: Adequate for Discharge Goal: Cardiovascular complication will be avoided Outcome: Adequate for Discharge   Problem: Activity: Goal: Risk for activity intolerance will decrease Outcome: Adequate for Discharge   Problem: Nutrition: Goal: Adequate nutrition will be maintained Outcome: Adequate for Discharge   Problem: Coping: Goal: Level of anxiety will decrease Outcome: Adequate for Discharge   Problem: Elimination: Goal: Will not experience complications related to bowel motility Outcome: Adequate for Discharge Goal: Will not experience complications related to urinary retention Outcome: Adequate for Discharge   Problem: Pain Managment: Goal: General experience of comfort will improve Outcome: Adequate for Discharge   Problem: Safety: Goal: Ability to remain free from injury will improve Outcome: Adequate for Discharge   Problem: Skin Integrity: Goal: Risk for impaired skin integrity will decrease Outcome: Adequate for Discharge

## 2020-01-02 NOTE — Progress Notes (Signed)
Patient report called to [facility]

## 2020-01-04 DIAGNOSIS — I10 Essential (primary) hypertension: Secondary | ICD-10-CM | POA: Diagnosis not present

## 2020-01-04 DIAGNOSIS — E785 Hyperlipidemia, unspecified: Secondary | ICD-10-CM | POA: Diagnosis not present

## 2020-01-04 DIAGNOSIS — F0390 Unspecified dementia without behavioral disturbance: Secondary | ICD-10-CM | POA: Diagnosis not present

## 2020-01-04 DIAGNOSIS — G20 Parkinson's disease: Secondary | ICD-10-CM | POA: Diagnosis not present

## 2020-01-05 DIAGNOSIS — G20 Parkinson's disease: Secondary | ICD-10-CM | POA: Diagnosis not present

## 2020-01-05 DIAGNOSIS — R55 Syncope and collapse: Secondary | ICD-10-CM | POA: Diagnosis not present

## 2020-01-05 DIAGNOSIS — F410 Panic disorder [episodic paroxysmal anxiety] without agoraphobia: Secondary | ICD-10-CM | POA: Diagnosis not present

## 2020-01-05 DIAGNOSIS — F0390 Unspecified dementia without behavioral disturbance: Secondary | ICD-10-CM | POA: Diagnosis not present

## 2020-01-05 DIAGNOSIS — E7800 Pure hypercholesterolemia, unspecified: Secondary | ICD-10-CM | POA: Diagnosis not present

## 2020-01-05 DIAGNOSIS — I509 Heart failure, unspecified: Secondary | ICD-10-CM | POA: Diagnosis not present

## 2020-01-07 NOTE — Telephone Encounter (Signed)
[name] states he talked with Jenny Reichmann twice and the pt is at the hospital right now. Jenny Reichmann told him to call back Monday to drop off the monitor.

## 2020-01-08 ENCOUNTER — Other Ambulatory Visit: Payer: Self-pay

## 2020-01-08 NOTE — Patient Outreach (Signed)
  [facility]) Care Management Chronic Special Needs Program    01/08/2020  Name: [name], DOB: 10-24-1955  MRN: [id number]   Ms. Lindell Tussey is enrolled in a chronic special needs plan for Heart Failure. Received notification that client discharged from [facility] on 01/02/20 and transferred to [facility] facility.   PLAN;  RNCM will coordinate with [facility] care utilization management post acute nurse on clients progress in facility and continue to follow as needed.   [name] RN,BSN,CCM [facility] Care Management [phone]

## 2020-01-12 NOTE — Telephone Encounter (Signed)
[name]:  I talked to [facility] again this morning.  Patient is still not back at the facility.  She said to call back on Friday and check.     [name]

## 2020-01-14 ENCOUNTER — Other Ambulatory Visit: Payer: Self-pay

## 2020-01-14 NOTE — Patient Outreach (Addendum)
  [facility]) Care Management Chronic Special Needs Program   01/14/2020  Name: KARENE BRACKEN, DOB: Oct 23, 1955  MRN: [id number]  The client was discussed in today's interdisciplinary care team meeting.  The following issues were discussed:  Client's needs, Changes in health status, Key risk triggers/risk stratification, Care Plan, Coordination of care, Care transitions and Issues/barriers to care  Client remains in [facility] facility for rehab services.   Participants present:   Thea Silversmith, MSN, RN, CCM   Melissa Sandlin RN,BSN,CCM, CDE  [name] RN, BSN, CCM Maryella Shivers, MD  [name], RN, BSN, MS, CCM Coralie Carpen, MD [facility] CBCS/CMAA [facility] RN Teresa Pelton RN, BSN Roney Mans, RD, LDN Jacqlyn Larsen, RN, BSN, CCM Karrie Meres, Pharm D    Recommendations:  Have conversation with son/caregiver regarding Advanced care planning and educate regarding health conditions  Plan:  RNCM will continue to follow for care coordination needs and follow up with client as indicated.  [name] RN,BSN,CCM [facility] Management [phone]

## 2020-01-15 ENCOUNTER — Other Ambulatory Visit: Payer: Self-pay | Admitting: Pharmacist

## 2020-01-15 NOTE — Patient Outreach (Signed)
[facility])  [place] Team    [facility] pharmacy case will be closed as our team is transitioning from the [place] Management Department into the [facility] Quality Department and will no longer be using [facility] for documentation purposes.   [facility] pharmacy technician will continue to assist patient with medication assistance program applications until complete.      From chart review, patient was discharged to a SNF post discharge. If additional services are needed, a new referral can be placed.  [name], PharmD, [facility] Clinical Pharmacist [phone]

## 2020-01-19 DIAGNOSIS — E785 Hyperlipidemia, unspecified: Secondary | ICD-10-CM | POA: Diagnosis not present

## 2020-01-19 DIAGNOSIS — G20 Parkinson's disease: Secondary | ICD-10-CM | POA: Diagnosis not present

## 2020-01-19 DIAGNOSIS — F0390 Unspecified dementia without behavioral disturbance: Secondary | ICD-10-CM | POA: Diagnosis not present

## 2020-01-19 DIAGNOSIS — S72032D Displaced midcervical fracture of left femur, subsequent encounter for closed fracture with routine healing: Secondary | ICD-10-CM | POA: Diagnosis not present

## 2020-01-19 DIAGNOSIS — I10 Essential (primary) hypertension: Secondary | ICD-10-CM | POA: Diagnosis not present

## 2020-01-20 ENCOUNTER — Other Ambulatory Visit: Payer: Self-pay

## 2020-01-20 ENCOUNTER — Emergency Department (HOSPITAL_COMMUNITY)
Admission: EM | Admit: 2020-01-20 | Discharge: 2020-01-22 | Disposition: A | Discharge status: Home / Self Care | Payer: HMO | Attending: Emergency Medicine | Admitting: Emergency Medicine

## 2020-01-20 ENCOUNTER — Encounter (HOSPITAL_COMMUNITY): Payer: Self-pay

## 2020-01-20 ENCOUNTER — Emergency Department (HOSPITAL_COMMUNITY): Payer: HMO

## 2020-01-20 DIAGNOSIS — F0390 Unspecified dementia without behavioral disturbance: Secondary | ICD-10-CM | POA: Insufficient documentation

## 2020-01-20 DIAGNOSIS — Z79899 Other long term (current) drug therapy: Secondary | ICD-10-CM | POA: Insufficient documentation

## 2020-01-20 DIAGNOSIS — S79912A Unspecified injury of left hip, initial encounter: Secondary | ICD-10-CM | POA: Diagnosis not present

## 2020-01-20 DIAGNOSIS — I2510 Atherosclerotic heart disease of native coronary artery without angina pectoris: Secondary | ICD-10-CM | POA: Diagnosis not present

## 2020-01-20 DIAGNOSIS — Z95811 Presence of heart assist device: Secondary | ICD-10-CM | POA: Insufficient documentation

## 2020-01-20 DIAGNOSIS — G20 Parkinson's disease: Secondary | ICD-10-CM | POA: Insufficient documentation

## 2020-01-20 DIAGNOSIS — W19XXXA Unspecified fall, initial encounter: Secondary | ICD-10-CM | POA: Diagnosis not present

## 2020-01-20 DIAGNOSIS — R296 Repeated falls: Secondary | ICD-10-CM | POA: Insufficient documentation

## 2020-01-20 DIAGNOSIS — I5032 Chronic diastolic (congestive) heart failure: Secondary | ICD-10-CM | POA: Diagnosis not present

## 2020-01-20 DIAGNOSIS — I110 Hypertensive heart disease with heart failure: Secondary | ICD-10-CM | POA: Diagnosis not present

## 2020-01-20 DIAGNOSIS — Z720 Tobacco use: Secondary | ICD-10-CM | POA: Insufficient documentation

## 2020-01-20 DIAGNOSIS — Z9889 Other specified postprocedural states: Secondary | ICD-10-CM | POA: Diagnosis not present

## 2020-01-20 DIAGNOSIS — R0902 Hypoxemia: Secondary | ICD-10-CM | POA: Diagnosis not present

## 2020-01-20 DIAGNOSIS — I959 Hypotension, unspecified: Secondary | ICD-10-CM | POA: Diagnosis not present

## 2020-01-20 DIAGNOSIS — I10 Essential (primary) hypertension: Secondary | ICD-10-CM | POA: Diagnosis not present

## 2020-01-20 DIAGNOSIS — Z20822 Contact with and (suspected) exposure to covid-19: Secondary | ICD-10-CM | POA: Diagnosis not present

## 2020-01-20 DIAGNOSIS — Z03818 Encounter for observation for suspected exposure to other biological agents ruled out: Secondary | ICD-10-CM | POA: Diagnosis not present

## 2020-01-20 DIAGNOSIS — R519 Headache, unspecified: Secondary | ICD-10-CM | POA: Diagnosis not present

## 2020-01-20 LAB — BASIC METABOLIC PANEL
Anion gap: 12 (ref 5–15)
BUN: 20 mg/dL (ref 8–23)
CO2: 18 mmol/L — ABNORMAL LOW (ref 22–32)
Calcium: 10.5 mg/dL — ABNORMAL HIGH (ref 8.9–10.3)
Chloride: 109 mmol/L (ref 98–111)
Creatinine, Ser: 0.93 mg/dL (ref 0.44–1.00)
GFR calc Af Amer: >60 mL/min (ref >60)
GFR calc non Af Amer: >60 mL/min (ref >60)
Glucose, Bld: 82 mg/dL (ref 70–99)
Potassium: 4.9 mmol/L (ref 3.5–5.1)
Sodium: 139 mmol/L (ref 135–145)

## 2020-01-20 LAB — CBC WITH DIFFERENTIAL/PLATELET
Abs Immature Granulocytes: 0.02 10*3/uL (ref 0.00–0.07)
Basophils Absolute: 0 10*3/uL (ref 0.0–0.1)
Basophils Relative: 1 %
Eosinophils Absolute: 0.1 10*3/uL (ref 0.0–0.5)
Eosinophils Relative: 2 %
HCT: 39.8 % (ref 36.0–46.0)
Hemoglobin: 11.8 g/dL — ABNORMAL LOW (ref 12.0–15.0)
Immature Granulocytes: 0 %
Lymphocytes Relative: 30 %
Lymphs Abs: 1.7 10*3/uL (ref 0.7–4.0)
MCH: 28.2 pg (ref 26.0–34.0)
MCHC: 29.6 g/dL — ABNORMAL LOW (ref 30.0–36.0)
MCV: 95.2 fL (ref 80.0–100.0)
Monocytes Absolute: 0.9 10*3/uL (ref 0.1–1.0)
Monocytes Relative: 15 %
Neutro Abs: 3 10*3/uL (ref 1.7–7.7)
Neutrophils Relative %: 52 %
Platelets: 241 10*3/uL (ref 150–400)
RBC: 4.18 MIL/uL (ref 3.87–5.11)
RDW: 19.2 % — ABNORMAL HIGH (ref 11.5–15.5)
WBC: 5.7 10*3/uL (ref 4.0–10.5)
nRBC: 0 % (ref 0.0–0.2)

## 2020-01-20 LAB — RESPIRATORY PANEL BY RT PCR (FLU A&B, COVID)
Influenza A by PCR: NEGATIVE
Influenza B by PCR: NEGATIVE
SARS Coronavirus 2 by RT PCR: NEGATIVE

## 2020-01-20 NOTE — ED Provider Notes (Signed)
[facility] EMERGENCY DEPARTMENT Provider Note   CSN: [id number] Arrival date & time: 01/20/20  0998     History Chief Complaint  Patient presents with  . Fall    [name] is a 64 y.o. female.  [name] is a 64 y.o. female with a history of CHF, CAD, hypertension, hyperlipidemia, dementia, recent hip fracture s/p hip replacement, who presents to the ED from nursing facility after 2 falls.  Patient reports that she tried to get up and walk and lost her balance both times causing her to fall. She reports landing on her bottom both times. Recently discharged after left hip replacement.  Denies hip pain or pain elsewhere from the fall.  Patient is pleasantly demented.  She does not think that she hit her head, denies pain in her neck or back.  She has been able to get up and walk around since the fall.  Denies chest pain, shortness of breath or abdominal pain.  No fevers or chills.  No numbness weakness or tingling in her extremities.  Patient states that she came from [facility] facility. Additional history obtained by contacting facility.  Patient was actually just released from [facility] yesterday where she was receiving rehab after her recent hip replacement, and was sent back to assisted living at [facility].  I called and spoke with [facility] and they expressed concern that since returning to this level of care she is already fallen twice and they do not feel that she is safe to return.  Both falls were unwitnessed and they are not sure if she hit her head.  Patient is on aspirin but no other blood thinners.  Level 5 caveat: Dementia        Past Medical History:  Diagnosis Date  . Abnormal liver function tests   . AICD (automatic cardioverter/defibrillator) present    [employer]  . Anxiety   . Aortic stenosis   . Benign neoplasm of colon 04/19/2001   Hyperplastic  . Bicuspid aortic valve   . Cardiomyopathy  secondary   . CHF (congestive heart failure) ([place])   . Coronary artery disease   . Depression   . Headache   . Heart murmur   . HTN (hypertension)   . Hypercalcemia   . Hyperlipidemia   . Memory changes 06/2018   in the last year /per the brother  . S/P ICD (internal cardiac defibrillator) procedure 2018    Patient Active Problem List   Diagnosis Date Noted  . Closed left hip fracture, initial encounter ([place]) 12/30/2019  . Frequent falls   . Orthostatic hypotension 12/23/2019  . Syncopal episodes 12/22/2019  . 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 Renal insufficiency 12/22/2019  . Near syncope 09/04/2019  . AKI (acute kidney injury) ([place]) 09/04/2019  . Hypokalemia 09/04/2019  . Depression 09/04/2019  . Dementia ([place]) 09/04/2019  . Thrombocytopenia ([place]) 09/04/2019  . Low back pain 06/25/2019  . S/P AVR (aortic valve replacement) 12/12/2016  . Aortic stenosis 11/16/2016  . Severe aortic stenosis   . Polycythemia 06/06/2016  . Menopausal state 02/16/2016  . Hyperparathyroidism, primary ([place]) 02/16/2016  . Chronic diastolic CHF (congestive heart failure) ([place]) 05/07/2014  . Hx of colonic polyps 06/17/2013  . Ventricular tachycardia ([place]) 02/21/2011  . Automatic implantable cardioverter-defibrillator in situ 06/02/2009  . Hyperlipidemia 05/29/2009  . SMOKER 05/29/2009  . Essential hypertension 05/29/2009  . Aortic valve disorder 05/29/2009  .  CARDIOMYOPATHY, SECONDARY 05/29/2009  . CHF 05/29/2009  . BICUSPID AORTIC VALVE 05/29/2009  . LIVER FUNCTION TESTS, ABNORMAL, HX OF 05/29/2009  . CHOLECYSTITIS, HX OF 05/29/2009    Past Surgical History:  Procedure Laterality Date  . AORTIC VALVE REPLACEMENT N/A 11/16/2016   Procedure: AORTIC VALVE REPLACEMENT (AVR);  Surgeon: Gaye Pollack, MD;  Location: [facility];  Service: Open Heart Surgery;  Laterality: N/A;  89mm Perimount Magna Ease Pericardial Bioprosthesis  . BREAST  EXCISIONAL BIOPSY Left 1984   benign  . CARDIAC CATHETERIZATION N/A 07/05/2016   Procedure: Right/Left Heart Cath and Coronary Angiography;  Surgeon: Burnell Blanks, MD;  Location: [facility] CV LAB;  Service: Cardiovascular;  Laterality: N/A;  . CHOLECYSTECTOMY    . implantation of a Guidant biventricular ICD    . laparoscopic cholecystectomy with intraoperative cholangiogram  2003  . TEE WITHOUT CARDIOVERSION N/A 11/16/2016   Procedure: TRANSESOPHAGEAL ECHOCARDIOGRAM (TEE);  Surgeon: Gaye Pollack, MD;  Location: [facility];  Service: Open Heart Surgery;  Laterality: N/A;  . TOTAL HIP ARTHROPLASTY Left 12/31/2019   Procedure: TOTAL HIP ARTHROPLASTY ANTERIOR APPROACH;  Surgeon: Rod Can, MD;  Location: [facility];  Service: Orthopedics;  Laterality: Left;     OB History   No obstetric history on file.     Family History  Problem Relation Age of Onset  . Pancreatic cancer Mother   . Colon cancer Maternal Aunt   . Bone cancer Brother   . Heart attack Brother   . Hypercalcemia Neg Hx     Social History   Tobacco Use  . Smoking status: Current Every Day Smoker    Packs/day: 0.25    Years: 40.00    Pack years: 10.00    Types: Cigarettes  . Smokeless tobacco: Never Used  . Tobacco comment: client reports she smokes 1 cigarette per day  Substance Use Topics  . Alcohol use: Yes    Alcohol/week: 0.0 standard drinks    Comment: occasional  . Drug use: No    Home Medications Prior to Admission medications   Medication Sig Start Date End Date Taking? Authorizing Provider  acetaminophen (TYLENOL) 325 MG tablet Take 2 tablets (650 mg total) by mouth 3 (three) times daily. 01/02/20   Arrien, Jimmy Picket, MD  aspirin EC 325 MG EC tablet Take 1 tablet (325 mg total) by mouth daily with breakfast. 01/02/20 02/16/20  Swinteck, Aaron Edelman, MD  carbidopa-levodopa (SINEMET IR) 25-100 MG tablet Take 1 tablet by mouth 3 (three) times daily. 01/02/20 02/01/20  Arrien, Jimmy Picket, MD    carvedilol (COREG) 12.5 MG tablet Take 1 tablet by mouth 2 (two) times a day. With food 12/16/18   [provider]  donepezil (ARICEPT) 10 MG tablet Take 1 tablet (10 mg total) by mouth daily. 01/02/20 02/01/20  Arrien, Jimmy Picket, MD  furosemide (LASIX) 20 MG tablet Take 1 tablet by mouth daily. 01/08/19   [provider]  guaifenesin (ROBITUSSIN) 100 MG/5ML syrup Take 200 mg by mouth 4 (four) times daily as needed for cough.    [provider]  loperamide (IMODIUM) 2 MG capsule Take 2 mg by mouth as needed for diarrhea or loose stools.    [provider]  magnesium hydroxide (MILK OF MAGNESIA) 400 MG/5ML suspension Take 30 mLs by mouth at bedtime as needed for mild constipation.    [provider]  Multiple Vitamin (MULTIVITAMIN WITH MINERALS) TABS tablet Take 1 tablet by mouth daily.     [provider]  olmesartan (BENICAR) 20 MG tablet Take 20 mg by mouth daily. 10/04/19   [provider]  oxyCODONE (OXY IR/ROXICODONE) 5 MG immediate release tablet Take 0.5 tablets (2.5 mg total) by mouth every 4 (four) hours as needed for moderate pain. 01/02/20   Arrien, Jimmy Picket, MD  Polyethyl Glycol-Propyl Glycol (SYSTANE OP) Place 1-2 drops into both eyes 3 (three) times daily as needed (for dry eyes.).    [provider]  polyethylene glycol (MIRALAX / GLYCOLAX) 17 g packet Take 17 g by mouth daily. 01/02/20 02/01/20  Arrien, Jimmy Picket, MD  rosuvastatin (CRESTOR) 10 MG tablet Take 1 tablet (10 mg total) by mouth daily. 05/01/11   Evans Lance, MD  sertraline (ZOLOFT) 100 MG tablet Take 1 tablet (100 mg total) by mouth daily. Patient taking differently: Take 100 mg by mouth daily as needed (depression).  02/28/19   Cameron Sprang, MD    Allergies    Trospium chloride er  Review of Systems   Review of Systems  Unable to perform ROS: Dementia    Physical Exam Updated Vital Signs BP 99/62 (BP Location: Right Arm)   Pulse  81   Temp 97.8 F (36.6 C) (Oral)   Resp 12   SpO2 100%   Physical Exam Vitals and nursing note reviewed.  Constitutional:      General: She is not in acute distress.    Appearance: She is well-developed. She is not diaphoretic.  HENT:     Head: Normocephalic and atraumatic.     Comments: Scalp appears atraumatic without hematoma, tenderness, step-off or deformity, negative battle sign Eyes:     General:        Right eye: No discharge.        Left eye: No discharge.     Extraocular Movements: Extraocular movements intact.     Pupils: Pupils are equal, round, and reactive to light.  Neck:     Comments: No midline C-spine tenderness, full range of motion Cardiovascular:     Rate and Rhythm: Normal rate and regular rhythm.     Pulses: Normal pulses.     Heart sounds: Normal heart sounds. No murmur. No friction rub. No gallop.   Pulmonary:     Effort: Pulmonary effort is normal. No respiratory distress.     Breath sounds: Normal breath sounds. No wheezing or rales.     Comments: Respirations equal and unlabored, patient able to speak in full sentences, lungs clear to auscultation bilaterally, chest wall NTTP Abdominal:     General: Bowel sounds are normal. There is no distension.     Palpations: Abdomen is soft. There is no mass.     Tenderness: There is no abdominal tenderness. There is no guarding.  Musculoskeletal:        General: No deformity.     Cervical back: Neck supple.     Comments: No midline thoracic or lumbar spine tenderness. All joints supple and easily movable, all compartments soft.  Specifically there is no focal tenderness noted over the left hip.  No instability over the pelvis.  Skin:    General: Skin is warm and dry.     Capillary Refill: Capillary refill takes less than 2 seconds.  Neurological:     Mental Status: She is alert.     Coordination: Coordination normal.     Comments: Speech is clear, able to follow commands, pleasantly demented CN III-XII  intact Normal strength in upper and lower extremities bilaterally including dorsiflexion and  plantar flexion, strong and equal grip strength Sensation normal to light and sharp touch Moves extremities without ataxia, coordination intact  Psychiatric:        Mood and Affect: Mood normal.        Behavior: Behavior normal.     ED Results / Procedures / Treatments   Labs (all labs ordered are listed, but only abnormal results are displayed) Labs Reviewed  BASIC METABOLIC PANEL - Abnormal; Notable for the following components:      Result Value   CO2 18 (*)    Calcium 10.5 (*)    All other components within normal limits  CBC WITH DIFFERENTIAL/PLATELET - Abnormal; Notable for the following components:   Hemoglobin 11.8 (*)    MCHC 29.6 (*)    RDW 19.2 (*)    All other components within normal limits  URINALYSIS, ROUTINE W REFLEX MICROSCOPIC    EKG EKG Interpretation  Date/Time:  Tuesday January 20 2020 13:37:14 EDT Ventricular Rate:  73 PR Interval:  144 QRS Duration: 134 QT Interval:  452 QTC Calculation: 497 R Axis:   -98 Text Interpretation: Atrial-sensed ventricular-paced rhythm Biventricular pacemaker detected Abnormal ECG No significant change since last tracing Confirmed by Dorie Rank [id number]) on 01/20/2020 1:44:50 PM   Radiology CT Head Wo Contrast  Result Date: 01/20/2020 CLINICAL DATA:  Multiple recent falls, headache EXAM: CT HEAD WITHOUT CONTRAST TECHNIQUE: Contiguous axial images were obtained from the base of the skull through the vertex without intravenous contrast. COMPARISON:  12/29/2019 FINDINGS: Brain: No evidence of acute infarction, hemorrhage, hydrocephalus, extra-axial collection or mass lesion/mass effect. Extensive low-density changes within the periventricular and subcortical white matter compatible with chronic microvascular ischemic change. Moderate diffuse cerebral volume loss. Vascular: Atherosclerotic calcifications involving the large vessels of the  skull base. No unexpected hyperdense vessel. Skull: Normal. Negative for fracture or focal lesion. Sinuses/Orbits: No acute finding. Other: None. IMPRESSION: 1.  No acute intracranial findings. 2.  Chronic microvascular ischemic change and cerebral volume loss. Electronically Signed   By: Davina Poke D.O.   On: 01/20/2020 14:36   DG Hip Unilat With Pelvis 2-3 Views Left  Result Date: 01/20/2020 CLINICAL DATA:  Fall EXAM: DG HIP (WITH OR WITHOUT PELVIS) 2-3V LEFT COMPARISON:  None. FINDINGS: Changes of left hip replacement. Overlying soft tissue swelling. No fracture or hardware complicating feature. IMPRESSION: Left hip replacement without acute bony abnormality. Overlying soft tissue swelling/hematoma. Electronically Signed   By: Rolm Baptise M.D.   On: 01/20/2020 10:29    Procedures Procedures (including critical care time)  Medications Ordered in ED Medications - No data to display  ED Course  I have reviewed the triage vital signs and the nursing notes.  Pertinent labs & imaging results that were available during my care of the patient were reviewed by me and considered in my medical decision making (see chart for details).    MDM Rules/Calculators/A&P                     64 year old female presents to the ED with multiple falls after returning to assisted living facility from recent rehab stay.  Facility expresses concern that she is already had 2 falls and does not think she is safe to return to this level of care.  On evaluation patient is well-appearing, pleasantly demented, not complaining of any pain.  Recently had left hip replacement, this hip is nontender to palpation and patient has normal range of motion.  No other clear signs of trauma.  Fall was unwitnessed, patient does not think that she hit her head.  Will consult case management for assistance with determining safe placement for the patient, will check basic labs, left hip x-ray and CT of the head.  No other pain or  signs of trauma elicited on exam.  I have personally ordered, reviewed and interpreted all labs and imaging. CBC: No leukocytosis, stable hemoglobin. BMP: CO2 of 18, mildly elevated calcium, normal renal function UA: Pending  Left hip x-ray: Left hip replacement intact without bony deformity, some soft tissue hematoma noted. CT of the head with no acute intracranial findings.  Social work and case management are working on placement for the patient, PT eval has been ordered, awaiting social work input on plan of care and disposition.    Awaiting med rec to restart patient's home meds pending placement.  As shift change care signed out to PA [facility] who will follow up on PT and social work recommendations and disposition appropriately.   Final Clinical Impression(s) / ED Diagnoses Final diagnoses:  Fall, initial encounter    Rx / DC Orders ED Discharge Orders    None       Janet Berlin 01/20/20 1529    Dorie Rank, MD 01/21/20 [phone]

## 2020-01-20 NOTE — ED Triage Notes (Signed)
Patient arrived from NH following fall x 2 last night and this am.per EMS patient has dementia. Patient answers basic questions correctly. Denies hip pain, denies all pain

## 2020-01-20 NOTE — Progress Notes (Signed)
CSW met with Pt and family at bedside. Pt is pleasant, A&Ox4. CSW discussed placement options and let family know that PT recommended SNF.  CSW sent out previous FL2 on file from 01/01/20 to several local SNFs, excluding [facility] per request of the family.  CSW also gave resources for [facility] to aide family in finding housing options beyond SNF care.  CSW gave Pt ice water and crackers.  

## 2020-01-20 NOTE — Patient Outreach (Signed)
  [facility]) Care Management Chronic Special Needs Program    01/20/2020  Name: [name], DOB: 1956-06-08  MRN: [id number]   [name] is enrolled in a chronic special needs plan for Heart Failure. Client admitted to [facility] skilled nursing facility for rehab status post ORIF l of the left hip.   Notification received that client was discharged from [facility] skilled nursing facility on 01/19/20 to [facility] assisted living. .Individualized care plan updated.   Transition of care to be completed by [facility] general discharge.   Goals Addressed            This Visit's Progress   . General - Client will not be readmitted within 30 days (C-SNP)   On track    Please follow discharge instructions and call provider if you have any questions. Please attend all follow up appointments as scheduled. Please take your medications as prescribed. Please call 24 Hour nurse advice line as needed [phone]).         PLAN; Care plan updated and sent to client and primary care provider. RNCM will continue to follow and collaborate/ coordinate as needed.   [name] RN,BSN,CCM [facility] Care Management [phone]

## 2020-01-20 NOTE — Progress Notes (Signed)
CSW called Linus Orn with [facility] in an attempt to find out more information about the staus of Pt. Left HIPAA compliant voicemail.

## 2020-01-20 NOTE — ED Notes (Signed)
Pt stated she could not give urine sample at this time

## 2020-01-20 NOTE — Progress Notes (Signed)
CSW spoke with Pamala Hurry at [facility] who states this patient is from the memory care side of the facility. Pamala Hurry reports the patient has fallen several times since last night and likely needs rehab. Pamala Hurry reports the patient was recently at [facility] in [place] for a few days.  CSW will wait for PT evaluations before proceeding with discharge planning.  Madilyn Fireman, MSW, LCSW-A Transitions of Care  Clinical Social Worker  [facility] Emergency Departments  Medical ICU [phone]

## 2020-01-20 NOTE — Evaluation (Signed)
Physical Therapy Evaluation Patient Details Name: [name] MRN: [id number] DOB: [birth year] Today's Date: 01/20/2020   History of Present Illness  Pt is a 64 y/o female presenting to the ED after fall at ALF. Pt with recent d/c from SNF to ALF after having L hip THR following fall with L hip fx. PMH includes CAD, CHF, and AICD.   Clinical Impression  Pt presenting with problem above and deficits below. Pt requiring mod A to perform bed mobility and stand at EOB. Required min A to perform short distance gait secondary to instability. Feel pt is at increased risk for falls and per notes, ALF may be unable to assist as needed. Pt will require hands on assist any time she is ambulating secondary to instability and high fall risk. Will continue to follow acutely to maximize functional mobility independence and safety.     Follow Up Recommendations SNF;Supervision/Assistance - 24 hour    Equipment Recommendations  None recommended by PT    Recommendations for Other Services       Precautions / Restrictions Precautions Precautions: Fall Precaution Comments: Pt with multiple falls.  Restrictions Weight Bearing Restrictions: No      Mobility  Bed Mobility Overal bed mobility: Needs Assistance Bed Mobility: Supine to Sit;Sit to Supine     Supine to sit: Mod assist Sit to supine: Mod assist   General bed mobility comments: Pt requiring increased time and multimodal cues for sequencing. Mod A for LE assist and scooting hips to EOB.   Transfers Overall transfer level: Needs assistance Equipment used: Rolling walker (2 wheeled) Transfers: Sit to/from Stand Sit to Stand: Mod assist         General transfer comment: Mod A for lift assist and steadying to stand. Pt initially with posterior lean, but able to correct with assist.   Ambulation/Gait Ambulation/Gait assistance: Min assist Gait Distance (Feet): 30 Feet Assistive device: Rolling walker (2 wheeled) Gait  Pattern/deviations: Step-through pattern;Decreased stride length;Narrow base of support Gait velocity: Decreased   General Gait Details: Unsteadiness and required min A for steadying. Multimodal cues and required some assist with management of RW.   Stairs            Wheelchair Mobility    Modified Rankin (Stroke Patients Only)       Balance Overall balance assessment: Needs assistance Sitting-balance support: Feet supported Sitting balance-Leahy Scale: Fair     Standing balance support: Bilateral upper extremity supported;During functional activity Standing balance-Leahy Scale: Poor Standing balance comment: Reliant on UE and external support                              Pertinent Vitals/Pain Pain Assessment: Faces Faces Pain Scale: Hurts a little bit Pain Location: L hip  Pain Descriptors / Indicators: Aching Pain Intervention(s): Monitored during session;Limited activity within patient's tolerance;Repositioned    Home Living Family/patient expects to be discharged to:: Assisted living               Home Equipment: Walker - 2 wheels Additional Comments: Pt from ALF, however, unsure if they can provide necessary care per notes.     Prior Function Level of Independence: Needs assistance   Gait / Transfers Assistance Needed: Reports staff assists with ambulation. When she fell, was up without assist.   ADL's / Homemaking Assistance Needed: Staff has to assist with bathing/dressing        Hand Dominance  Extremity/Trunk Assessment   Upper Extremity Assessment Upper Extremity Assessment: Generalized weakness    Lower Extremity Assessment Lower Extremity Assessment: Generalized weakness;LLE deficits/detail LLE Deficits / Details: Recent ORIF of L hip     Cervical / Trunk Assessment Cervical / Trunk Assessment: Normal  Communication   Communication: No difficulties  Cognition Arousal/Alertness: Awake/alert Behavior During  Therapy: WFL for tasks assessed/performed;Flat affect Overall Cognitive Status: History of cognitive impairments - at baseline                                 General Comments: Dementia at baseline. Reports she lives at home, however, brother reports she is at [place]      Exercises     Assessment/Plan    PT Assessment Patient needs continued PT services  PT Problem List Decreased strength;Decreased balance;Decreased mobility;Decreased cognition;Decreased knowledge of use of DME;Decreased safety awareness;Decreased knowledge of precautions       PT Treatment Interventions Gait training;Functional mobility training;Therapeutic activities;DME instruction;Therapeutic exercise;Balance training;Patient/family education;Cognitive remediation    PT Goals (Current goals can be found in the Care Plan section)  Acute Rehab PT Goals Patient Stated Goal: to stop falling PT Goal Formulation: With patient Time For Goal Achievement: 02/03/20 Potential to Achieve Goals: Good    Frequency Min 2X/week   Barriers to discharge        Co-evaluation               AM-PAC PT "6 Clicks" Mobility  Outcome Measure Help needed turning from your back to your side while in a flat bed without using bedrails?: A Lot Help needed moving from lying on your back to sitting on the side of a flat bed without using bedrails?: A Lot Help needed moving to and from a bed to a chair (including a wheelchair)?: A Lot Help needed standing up from a chair using your arms (e.g., wheelchair or bedside chair)?: A Lot Help needed to walk in hospital room?: A Lot Help needed climbing 3-5 steps with a railing? : A Lot 6 Click Score: 12    End of Session Equipment Utilized During Treatment: Gait belt Activity Tolerance: Patient tolerated treatment well Patient left: in bed;with call bell/phone within reach;with family/visitor present Nurse Communication: Mobility status PT Visit  Diagnosis: Muscle weakness (generalized) (M62.81);Unsteadiness on feet (R26.81)    Time: 5038-8828 PT Time Calculation (min) (ACUTE ONLY): 15 min   Charges:   PT Evaluation $PT Eval Moderate Complexity: 1 Mod          Reuel Derby, PT, DPT  Acute Rehabilitation Services  Pager: [phone] Office: [phone]   Rudean Hitt 01/20/2020, 3:33 PM

## 2020-01-21 LAB — URINALYSIS, ROUTINE W REFLEX MICROSCOPIC
Bacteria, UA: NONE SEEN
Bilirubin Urine: NEGATIVE
Glucose, UA: NEGATIVE mg/dL
Hgb urine dipstick: NEGATIVE
Ketones, ur: NEGATIVE mg/dL
Leukocytes,Ua: NEGATIVE
Nitrite: NEGATIVE
Protein, ur: 100 mg/dL — AB
Specific Gravity, Urine: 1.023 (ref 1.005–1.030)
pH: 5 (ref 5.0–8.0)

## 2020-01-21 MED ORDER — ROSUVASTATIN CALCIUM 5 MG PO TABS
10.0000 mg | ORAL_TABLET | Freq: Every day | ORAL | Status: DC
  Administered 2020-01-21 – 2020-01-22 (×2): 10 mg via ORAL
  Filled 2020-01-21 (×2): qty 2

## 2020-01-21 MED ORDER — DONEPEZIL HCL 10 MG PO TABS
10.0000 mg | ORAL_TABLET | Freq: Every day | ORAL | Status: DC
  Administered 2020-01-22: 10 mg via ORAL
  Filled 2020-01-21 (×2): qty 1

## 2020-01-21 MED ORDER — IRBESARTAN 150 MG PO TABS
150.0000 mg | ORAL_TABLET | Freq: Every day | ORAL | Status: DC
  Administered 2020-01-22: 150 mg via ORAL
  Filled 2020-01-21 (×2): qty 1

## 2020-01-21 MED ORDER — POLYETHYLENE GLYCOL 3350 17 G PO PACK
17.0000 g | PACK | Freq: Every day | ORAL | Status: DC
  Administered 2020-01-21 – 2020-01-22 (×2): 17 g via ORAL
  Filled 2020-01-21 (×2): qty 1

## 2020-01-21 MED ORDER — CARVEDILOL 12.5 MG PO TABS
12.5000 mg | ORAL_TABLET | Freq: Two times a day (BID) | ORAL | Status: DC
  Administered 2020-01-22 (×2): 12.5 mg via ORAL
  Filled 2020-01-21 (×3): qty 1

## 2020-01-21 MED ORDER — ASPIRIN EC 325 MG PO TBEC
325.0000 mg | DELAYED_RELEASE_TABLET | Freq: Every day | ORAL | Status: DC
  Administered 2020-01-22: 325 mg via ORAL
  Filled 2020-01-21: qty 1

## 2020-01-21 MED ORDER — FUROSEMIDE 20 MG PO TABS
20.0000 mg | ORAL_TABLET | Freq: Every day | ORAL | Status: DC
  Administered 2020-01-21 – 2020-01-22 (×2): 20 mg via ORAL
  Filled 2020-01-21 (×2): qty 1

## 2020-01-21 MED ORDER — GUAIFENESIN 100 MG/5ML PO SYRP
200.0000 mg | ORAL_SOLUTION | Freq: Four times a day (QID) | ORAL | Status: DC | PRN
  Filled 2020-01-21: qty 10

## 2020-01-21 MED ORDER — SERTRALINE HCL 100 MG PO TABS
100.0000 mg | ORAL_TABLET | Freq: Every day | ORAL | Status: DC
  Administered 2020-01-21: 100 mg via ORAL
  Filled 2020-01-21 (×2): qty 1

## 2020-01-21 MED ORDER — ACETAMINOPHEN 500 MG PO TABS
1000.0000 mg | ORAL_TABLET | Freq: Once | ORAL | Status: AC
  Administered 2020-01-21: 1000 mg via ORAL
  Filled 2020-01-21: qty 2

## 2020-01-21 MED ORDER — ADULT MULTIVITAMIN W/MINERALS CH
1.0000 | ORAL_TABLET | Freq: Every day | ORAL | Status: DC
  Administered 2020-01-21 – 2020-01-22 (×2): 1 via ORAL
  Filled 2020-01-21 (×2): qty 1

## 2020-01-21 MED ORDER — CARBIDOPA-LEVODOPA 25-100 MG PO TABS
1.0000 | ORAL_TABLET | Freq: Three times a day (TID) | ORAL | Status: DC
  Administered 2020-01-21 – 2020-01-22 (×3): 1 via ORAL
  Filled 2020-01-21 (×5): qty 1

## 2020-01-21 NOTE — ED Notes (Signed)
Pt had dinner brought by son. Pt resting comfortably.

## 2020-01-21 NOTE — Progress Notes (Signed)
CSW spoke with family; niece Dewaine Oats and son, Gerald Stabs and it was decided that Pt would go to [facility] rehab.  CSW called [facility] to confirm placement.  CSW called [employer] to request authorization.

## 2020-01-21 NOTE — ED Notes (Signed)
Pt requesting two extra strength tylenol for R hip pain; spoke to [facility] in regards to medication

## 2020-01-21 NOTE — ED Notes (Signed)
Called and requested medications from pharmacy.

## 2020-01-21 NOTE — ED Notes (Signed)
Pt assisted to bathroom via wheelchair. Brief saturated in urine. Pt able to obtain urine sample after cleaning. pericare completed, new brief in place.   Pt very unsteady on her feet, only able to take a couple of shuffling steps with RN assisting. Healing suture area noted to to L hip.   Pt placed in hospital bed for comfort

## 2020-01-21 NOTE — ED Notes (Signed)
Urine culture sent with urine sample to lab if ordered

## 2020-01-22 ENCOUNTER — Other Ambulatory Visit: Payer: Self-pay

## 2020-01-22 DIAGNOSIS — R5381 Other malaise: Secondary | ICD-10-CM | POA: Diagnosis not present

## 2020-01-22 DIAGNOSIS — Z7401 Bed confinement status: Secondary | ICD-10-CM | POA: Diagnosis not present

## 2020-01-22 DIAGNOSIS — M2550 Pain in unspecified joint: Secondary | ICD-10-CM | POA: Diagnosis not present

## 2020-01-22 NOTE — Discharge Planning (Signed)
Licensed Clinical Social Worker is seeking post-discharge placement for this patient at the following level of care: SNF    

## 2020-01-22 NOTE — Telephone Encounter (Signed)
Hey:  new update.  Talked to [name] again today.  She said the patient is back at the hospital and will be leaving their facility for a down-grade facility.  Sounds like she is not doing well.  Since we don't know where she will be next, I will put this aside until I hear from you again.     [name]

## 2020-01-22 NOTE — ED Notes (Signed)
[facility] called to transport to [facility]

## 2020-01-22 NOTE — Discharge Instructions (Addendum)
Continue taking home medications as prescribed. Return to the emergency room with any new, worsening, concerning symptoms.

## 2020-01-22 NOTE — Progress Notes (Addendum)
12:40pm: CSW spoke with Marlowe Kays at [facility] who states this patient's SNF request was denied as the patient was deemed at baseline. CSW was given Dr. Amalia Hailey from [facility] contact information for an appeal if desired.  CSW spoke with Pamala Hurry at [facility] who states she can accept the patient back tomorrow at the earliest.   [place] updated Maudie Mercury, Therapist, sports of information.  9:30am: CSW received call from [place] at [facility] who provided the ambulance authorization number - [phone].  CSW requested this patient's SNF request be reviewed ASAP due to her being in the ED for 48 hours.  8:15am: CSW spoke with Colletta Maryland at [facility] who states this patient's insurance authorization is still under review at this time. Colletta Maryland will return call to CSW once Josem Kaufmann is obtained.  Madilyn Fireman, MSW, LCSW-A Transitions of Care  Clinical Social Worker  [facility] Emergency Departments  Medical ICU [phone]

## 2020-01-22 NOTE — NC FL2 (Addendum)
[place] LEVEL OF CARE SCREENING TOOL     IDENTIFICATION  Patient Name: [name] Birthdate: March 13, 1956 Sex: female Admission Date (Current Location): 01/20/2020  [facility] and [place] Number:  Herbalist and Address:  [facility] [place] [address] [place], [place] [address]      Provider Number: [id number]  Attending Physician Name and Address:  Default, Provider, MD  Relative Name and Phone Number:  [name] [phone]    Current Level of Care: Hospital Recommended Level of Care: Memory Care Prior Approval Number:    Date Approved/Denied:   PASRR Number: [id number]  Discharge Plan: Other (Comment)([facility])    Current Diagnoses: Patient Active Problem List   Diagnosis Date Noted  . Closed left hip fracture, initial encounter ([place]) 12/30/2019  . Frequent falls   . Orthostatic hypotension 12/23/2019  . Syncopal episodes 12/22/2019  . Dementia due to Parkinson's disease without behavioral disturbance ([place]) 12/22/2019  . Renal insufficiency 12/22/2019  . Near syncope 09/04/2019  . AKI (acute kidney injury) ([place]) 09/04/2019  . Hypokalemia 09/04/2019  . Depression 09/04/2019  . Dementia ([place]) 09/04/2019  . Thrombocytopenia ([place]) 09/04/2019  . Low back pain 06/25/2019  . S/P AVR (aortic valve replacement) 12/12/2016  . Aortic stenosis 11/16/2016  . Severe aortic stenosis   . Polycythemia 06/06/2016  . Menopausal state 02/16/2016  . Hyperparathyroidism, primary ([place]) 02/16/2016  . Chronic diastolic CHF (congestive heart failure) ([place]) 05/07/2014  . Hx of colonic polyps 06/17/2013  . Ventricular tachycardia ([place]) 02/21/2011  . Automatic implantable cardioverter-defibrillator in situ 06/02/2009  . Hyperlipidemia 05/29/2009  . SMOKER 05/29/2009  . Essential hypertension 05/29/2009  . Aortic valve disorder 05/29/2009  . CARDIOMYOPATHY, SECONDARY 05/29/2009  . CHF 05/29/2009  .  BICUSPID AORTIC VALVE 05/29/2009  . LIVER FUNCTION TESTS, ABNORMAL, HX OF 05/29/2009  . CHOLECYSTITIS, HX OF 05/29/2009    Orientation RESPIRATION BLADDER Height & Weight     Self, Situation, Place  Normal Incontinent, External catheter Weight:   Height:     BEHAVIORAL SYMPTOMS/MOOD NEUROLOGICAL BOWEL NUTRITION STATUS      Incontinent    AMBULATORY STATUS COMMUNICATION OF NEEDS Skin   Limited Assist Verbally Surgical wounds                       Personal Care Assistance Level of Assistance  Bathing, Feeding, Dressing Bathing Assistance: Limited assistance Feeding assistance: Limited assistance Dressing Assistance: Limited assistance     Functional Limitations Info  Sight, Speech, Hearing Sight Info: Adequate Hearing Info: Adequate Speech Info: Adequate    SPECIAL CARE FACTORS FREQUENCY                       Contractures Contractures Info: Not present    Additional Factors Info  Allergies, Code Status, Psychotropic Code Status Info: Full Allergies Info: Trospium Chloride Er Psychotropic Info: Zoloft         Current Medications (01/22/2020):  This is the current hospital active medication list Current Facility-Administered Medications  Medication Dose Route Frequency Provider Last Rate Last Admin  . aspirin EC tablet 325 mg  325 mg Oral Q breakfast Little, Wenda Overland, MD   325 mg at 01/22/20 0912  . carbidopa-levodopa (SINEMET IR) 25-100 MG per tablet immediate release 1 tablet  1 tablet Oral TID Little, Wenda Overland, MD   1 tablet at 01/22/20 1013  . carvedilol (COREG) tablet 12.5 mg  12.5 mg  Oral BID WC Little, Wenda Overland, MD   12.5 mg at 01/22/20 0913  . donepezil (ARICEPT) tablet 10 mg  10 mg Oral Daily Little, Wenda Overland, MD   10 mg at 01/22/20 0003  . furosemide (LASIX) tablet 20 mg  20 mg Oral Daily Little, Wenda Overland, MD   20 mg at 01/21/20 1958  . guaifenesin (ROBITUSSIN) 100 MG/5ML syrup 200 mg  200 mg Oral QID PRN Little, Wenda Overland, MD      . irbesartan (AVAPRO) tablet 150 mg  150 mg Oral Daily Little, Wenda Overland, MD   150 mg at 01/22/20 0003  . multivitamin with minerals tablet 1 tablet  1 tablet Oral Daily Little, Wenda Overland, MD   1 tablet at 01/21/20 1958  . polyethylene glycol (MIRALAX / GLYCOLAX) packet 17 g  17 g Oral Daily Little, Wenda Overland, MD   17 g at 01/21/20 1959  . rosuvastatin (CRESTOR) tablet 10 mg  10 mg Oral Daily Little, Wenda Overland, MD   10 mg at 01/21/20 1959  . sertraline (ZOLOFT) tablet 100 mg  100 mg Oral Daily Little, Wenda Overland, MD   100 mg at 01/21/20 2256   Current Outpatient Medications  Medication Sig Dispense Refill  . acetaminophen (TYLENOL) 325 MG tablet Take 2 tablets (650 mg total) by mouth 3 (three) times daily. (Patient taking differently: Take 325 mg by mouth every 6 (six) hours as needed for mild pain. ) 20 tablet 0  . aspirin EC 325 MG EC tablet Take 1 tablet (325 mg total) by mouth daily with breakfast. 30 tablet 0  . carbidopa-levodopa (SINEMET IR) 25-100 MG tablet Take 1 tablet by mouth 3 (three) times daily. (Patient taking differently: Take 1 tablet by mouth 2 (two) times daily. ) 90 tablet 0  . carvedilol (COREG) 12.5 MG tablet Take 1 tablet by mouth 2 (two) times a day. With food    . donepezil (ARICEPT) 10 MG tablet Take 1 tablet (10 mg total) by mouth daily. 30 tablet 0  . furosemide (LASIX) 20 MG tablet Take 1 tablet by mouth daily.    Marland Kitchen guaifenesin (ROBITUSSIN) 100 MG/5ML syrup Take 200 mg by mouth 4 (four) times daily as needed for cough.    . loperamide (IMODIUM) 2 MG capsule Take 2 mg by mouth as needed for diarrhea or loose stools.    . magnesium hydroxide (MILK OF MAGNESIA) 400 MG/5ML suspension Take 30 mLs by mouth at bedtime as needed for mild constipation.    . Multiple Vitamin (MULTIVITAMIN WITH MINERALS) TABS tablet Take 1 tablet by mouth daily.     . Nutritional Supplements (NUTRITIONAL DRINK PO) Take 4 oz by mouth 2 (two) times daily.    Marland Kitchen  olmesartan (BENICAR) 20 MG tablet Take 20 mg by mouth daily.    . OXYGEN Inhale 2 L into the lungs as needed (every hr).     Vladimir Faster Glycol-Propyl Glycol (SYSTANE OP) Place 1-2 drops into both eyes 3 (three) times daily as needed (for dry eyes.).    Marland Kitchen polyethylene glycol (MIRALAX / GLYCOLAX) 17 g packet Take 17 g by mouth daily. 30 packet 0  . rosuvastatin (CRESTOR) 10 MG tablet Take 1 tablet (10 mg total) by mouth daily. 90 tablet 3  . sertraline (ZOLOFT) 100 MG tablet Take 1 tablet (100 mg total) by mouth daily. 30 tablet 11  . oxyCODONE (OXY IR/ROXICODONE) 5 MG immediate release tablet Take 0.5 tablets (2.5 mg total) by mouth every 4 (four)  hours as needed for moderate pain. (Patient not taking: Reported on 01/20/2020) 20 tablet 0     Discharge Medications: Please see discharge summary for a list of discharge medications.  Relevant Imaging Results:  Relevant Lab Results:   Additional Information SSN [id number]  Archie Endo, LCSW

## 2020-01-22 NOTE — ED Notes (Signed)
Breakfast ordered 

## 2020-01-22 NOTE — Patient Outreach (Signed)
  [facility]) Care Management Chronic Special Needs Program    01/22/2020  Name: [name], DOB: 1956-01-25  MRN: [id number]   [name] is enrolled in a chronic special needs plan for Heart Failure. Received notification from Dr. Amalia Hailey with [employer] that SNF request for this patient was denied. Per chart review, inpatient social workers Madilyn Fireman and Vergie Living are working on clients discharge arrangements. Per chart review plans are for client to return to [facility].  Telephone call to client's son. Unable to reach. HIPAA compliant voice message left with call back phone number.   Received return call from client's son, [name]. HIPAA verified by son for client. Son states client has been denied by her insurance to go to skilled nursing facility.   He states he is unsure if client will be returning to [facility].  Son states he has been at work all day so he is not sure what arrangements have been made for clients discharge. Son states client has applied for Medicaid but did not qualify due to income. RNCM inquired if advanced directive has been completed for client. Son states he has not received the Advanced directive paperwork. RNCM advised she  will re-send Advanced directive packet.  Son states his uncle is currently at the hospital with client. He states he will have to follow up with his uncle and/ or hospital to determine clients discharge arrangements.    PLAN; RNCM will follow up with son within 3 business days.   [name] RN,BSN,CCM [facility] Care Management [phone]

## 2020-01-22 NOTE — ED Notes (Signed)
Lunch Tray Ordered @ 1119. 

## 2020-01-22 NOTE — ED Notes (Signed)
Pt resting comfortably. Respirations unlabored, NAD noted. Warm blanket provided, no additional needs expressed at this time

## 2020-01-22 NOTE — Patient Outreach (Signed)
  [facility]) Care Management Chronic Special Needs Program    01/22/2020  Name: [name], DOB: 04/02/1956  MRN: [id number]   [name] is enrolled in a chronic special needs plan for Heart Failure. Telephone call to client's son/ designated party release, [name] for post hospital/ SNF/ ED follow up.  Unable to reach. HIPAA compliant voice message left with call back phone number and return call request.   PLAN: RNCM will attempt 2nd telephone outreach to son within 1 week.   [name] RN,BSN,CCM [facility] Management [phone]

## 2020-01-22 NOTE — Progress Notes (Signed)
CSW called [facility]  And arranged for Pt to return.  Pt will go Room 307 Report # [id number]

## 2020-01-26 ENCOUNTER — Other Ambulatory Visit: Payer: Self-pay

## 2020-01-26 DIAGNOSIS — G20 Parkinson's disease: Secondary | ICD-10-CM | POA: Diagnosis not present

## 2020-01-26 DIAGNOSIS — F0390 Unspecified dementia without behavioral disturbance: Secondary | ICD-10-CM | POA: Diagnosis not present

## 2020-01-26 DIAGNOSIS — F410 Panic disorder [episodic paroxysmal anxiety] without agoraphobia: Secondary | ICD-10-CM | POA: Diagnosis not present

## 2020-01-26 DIAGNOSIS — I509 Heart failure, unspecified: Secondary | ICD-10-CM | POA: Diagnosis not present

## 2020-01-26 DIAGNOSIS — Z7409 Other reduced mobility: Secondary | ICD-10-CM | POA: Diagnosis not present

## 2020-01-26 DIAGNOSIS — E7800 Pure hypercholesterolemia, unspecified: Secondary | ICD-10-CM | POA: Diagnosis not present

## 2020-01-26 DIAGNOSIS — R55 Syncope and collapse: Secondary | ICD-10-CM | POA: Diagnosis not present

## 2020-01-26 NOTE — Telephone Encounter (Signed)
Cindy from [facility] calling back stating the patient will be staying with them and will need the monitor sent to them.

## 2020-01-26 NOTE — Patient Outreach (Signed)
  [facility]) Care Management Chronic Special Needs Program    01/26/2020  Name: MELISHA EGGLETON, DOB: 09-07-1956  MRN: [id number]   Ms. Hailey Stormer is enrolled in a chronic special needs plan for Heart Failure. Telephone call to son, [name] for follow up. Unable to reach. HIPAA compliant voice message left with call back phone number and return call request.   PLAN; RNCM will attempt 2nd telephone outreach to son.   [name] RN,BSN,CCM [facility] Management [phone]

## 2020-01-27 NOTE — Telephone Encounter (Signed)
LMOVM

## 2020-02-02 ENCOUNTER — Other Ambulatory Visit: Payer: Self-pay

## 2020-02-02 DIAGNOSIS — F0391 Unspecified dementia with behavioral disturbance: Secondary | ICD-10-CM | POA: Diagnosis not present

## 2020-02-02 DIAGNOSIS — R55 Syncope and collapse: Secondary | ICD-10-CM | POA: Diagnosis not present

## 2020-02-02 DIAGNOSIS — Z7409 Other reduced mobility: Secondary | ICD-10-CM | POA: Diagnosis not present

## 2020-02-02 DIAGNOSIS — I509 Heart failure, unspecified: Secondary | ICD-10-CM | POA: Diagnosis not present

## 2020-02-02 DIAGNOSIS — N393 Stress incontinence (female) (male): Secondary | ICD-10-CM | POA: Diagnosis not present

## 2020-02-02 DIAGNOSIS — G20 Parkinson's disease: Secondary | ICD-10-CM | POA: Diagnosis not present

## 2020-02-02 DIAGNOSIS — F410 Panic disorder [episodic paroxysmal anxiety] without agoraphobia: Secondary | ICD-10-CM | POA: Diagnosis not present

## 2020-02-02 DIAGNOSIS — E7800 Pure hypercholesterolemia, unspecified: Secondary | ICD-10-CM | POA: Diagnosis not present

## 2020-02-02 NOTE — Patient Outreach (Signed)
  [facility]) Care Management Chronic Special Needs Program    02/02/2020  Name: [name], DOB: [birth year]  MRN: [id number]   [name] is enrolled in a chronic special needs plan for Heart Failure. Telephone call to client's son, [name]. HIPAA verified by son for client. Son states client is doing ok.  Denies any recent falls. Son states the assisted living facility has an alarm on client's wheelchair because she keeps trying to get up.  Son states client was denied twice for Medicaid.  He states social services is mailing him additional paperwork so that he can apply again for her. Son states he would like client to remain at [facility] for her ongoing care. RNCM discussed with son need to have Advanced directive completed and social work follow up.   RNCM called Cindy with [facility].  Jenny Reichmann states client continues to try to get up. She reports a chair alarm has been applied to assist with safety. Jenny Reichmann states client was seen by the in house doctor today for her post hospital follow up. No changes in treatment plan. She reports patient is also waiting on heart monitor  Jenny Reichmann states clients physical therapy will be ending due to insurance coverage. She reports client is currently private pay but no payment has been received for her stay.   RNCM will follow up with [facility]/ client prior to social worker referral.   PLAN; RNCM will follow up within 3 business days.   [name] RN,BSN,CCM Chronic Care Management Coordinator [facility] [phone]  . Marland Kitchen

## 2020-02-04 ENCOUNTER — Ambulatory Visit: Payer: Self-pay

## 2020-02-05 ENCOUNTER — Other Ambulatory Visit: Payer: Self-pay

## 2020-02-05 ENCOUNTER — Telehealth: Payer: Self-pay | Admitting: Internal Medicine

## 2020-02-05 NOTE — Patient Outreach (Signed)
  [facility]) Care Management Chronic Special Needs Program    02/05/2020  Name: ASTI MACKLEY, DOB: 1956/09/02  MRN: [id number]   Ms. Emoree Sasaki is enrolled in a chronic special needs plan for Heart Failure. Telephone call to [facility] for follow up. Spoke with Jenny Reichmann who state she still has not received the monitor for client's pacemaker checks. She reports she has called [facility] and left message but still has not heard back.  Jenny Reichmann states she spoke with the billing person and [facility] and confirmed that no payments have been received for client's stay.  She states that billing has been in contact with client's son.  RNCM offered to refer client's son to Education officer, museum for assistance.  RNCM offered to assist with contacting [facility] facility for pacemaker monitor.  Jenny Reichmann states she will return call to provide contact phone number.   PLAN; RNCM will refer client / caregiver to social worker   [name] RN,BSN,CCM [facility] [phone]

## 2020-02-05 NOTE — Telephone Encounter (Signed)
New Message  Cindy from [facility] is on the line wanting to speak with the device clinic to get patient a monitor at the facility. Please give Jenny Reichmann a call back to assist.

## 2020-02-05 NOTE — Telephone Encounter (Signed)
I let Jenny Reichmann know that Joey from [facility] is going to contact her about bringing the monitor to her.

## 2020-02-10 DIAGNOSIS — R2681 Unsteadiness on feet: Secondary | ICD-10-CM | POA: Diagnosis not present

## 2020-02-10 DIAGNOSIS — M6281 Muscle weakness (generalized): Secondary | ICD-10-CM | POA: Diagnosis not present

## 2020-02-13 DIAGNOSIS — M6281 Muscle weakness (generalized): Secondary | ICD-10-CM | POA: Diagnosis not present

## 2020-02-13 DIAGNOSIS — R278 Other lack of coordination: Secondary | ICD-10-CM | POA: Diagnosis not present

## 2020-02-13 DIAGNOSIS — R293 Abnormal posture: Secondary | ICD-10-CM | POA: Diagnosis not present

## 2020-02-17 ENCOUNTER — Encounter (HOSPITAL_COMMUNITY): Payer: Self-pay | Admitting: Emergency Medicine

## 2020-02-17 ENCOUNTER — Emergency Department (HOSPITAL_COMMUNITY)
Admission: EM | Admit: 2020-02-17 | Discharge: 2020-02-17 | Disposition: A | Discharge status: Skilled Nursing Facility | Payer: HMO | Attending: Emergency Medicine | Admitting: Emergency Medicine

## 2020-02-17 ENCOUNTER — Other Ambulatory Visit: Payer: Self-pay

## 2020-02-17 DIAGNOSIS — F17210 Nicotine dependence, cigarettes, uncomplicated: Secondary | ICD-10-CM | POA: Diagnosis not present

## 2020-02-17 DIAGNOSIS — W19XXXA Unspecified fall, initial encounter: Secondary | ICD-10-CM | POA: Diagnosis not present

## 2020-02-17 DIAGNOSIS — Y92121 Bathroom in nursing home as the place of occurrence of the external cause: Secondary | ICD-10-CM | POA: Diagnosis not present

## 2020-02-17 DIAGNOSIS — Z95810 Presence of automatic (implantable) cardiac defibrillator: Secondary | ICD-10-CM | POA: Diagnosis not present

## 2020-02-17 DIAGNOSIS — R69 Illness, unspecified: Secondary | ICD-10-CM | POA: Diagnosis not present

## 2020-02-17 DIAGNOSIS — Z743 Need for continuous supervision: Secondary | ICD-10-CM | POA: Diagnosis not present

## 2020-02-17 DIAGNOSIS — Y999 Unspecified external cause status: Secondary | ICD-10-CM | POA: Diagnosis not present

## 2020-02-17 DIAGNOSIS — Y9389 Activity, other specified: Secondary | ICD-10-CM | POA: Diagnosis not present

## 2020-02-17 DIAGNOSIS — Z043 Encounter for examination and observation following other accident: Secondary | ICD-10-CM | POA: Diagnosis not present

## 2020-02-17 DIAGNOSIS — R279 Unspecified lack of coordination: Secondary | ICD-10-CM | POA: Diagnosis not present

## 2020-02-17 DIAGNOSIS — R5381 Other malaise: Secondary | ICD-10-CM | POA: Diagnosis not present

## 2020-02-17 NOTE — Telephone Encounter (Signed)
Spoke with Joey from [facility] today, he ahs not yet delivered new monitor.

## 2020-02-17 NOTE — ED Provider Notes (Signed)
[facility] DEPT Provider Note   CSN: [id number] Arrival date & time: 02/17/20  1951     History Chief Complaint  Patient presents with  . Fall    [name] is a 64 y.o. female.  Patient is a 64 year old female with a history of CAD, CHF, hypertension, memory problems currently living in a skilled facility presenting today after a fall.  Patient reports that she was coming out of the bathroom and she fell to the ground.  She is not sure what caused her fall however she remembers being on the ground.  She is not sure how long she was there but facility reports her roommate witnessed the fall.  Patient denies loss of consciousness.  She currently has no complaints.  She has no headache, neck pain, chest pain, abdominal pain or lower extremity pain.  She denies any nausea, vomiting, shortness of breath.  She does not take anticoagulation.  Patient has been seen multiple times in the past for similar symptoms.  The history is provided by the patient, the EMS personnel and the nursing home.  Fall       Past Medical History:  Diagnosis Date  . Abnormal liver function tests   . AICD (automatic cardioverter/defibrillator) present    [employer]  . Anxiety   . Aortic stenosis   . Benign neoplasm of colon 04/19/2001   Hyperplastic  . Bicuspid aortic valve   . Cardiomyopathy secondary   . CHF (congestive heart failure) ([place])   . Coronary artery disease   . Depression   . Headache   . Heart murmur   . HTN (hypertension)   . Hypercalcemia   . Hyperlipidemia   . Memory changes 06/2018   in the last year /per the brother  . S/P ICD (internal cardiac defibrillator) procedure 2018    Patient Active Problem List   Diagnosis Date Noted  . Closed left hip fracture, initial encounter ([place]) 12/30/2019  . Frequent falls   . Orthostatic hypotension 12/23/2019  . Syncopal episodes 12/22/2019  . Dementia due to Parkinson's disease without  behavioral disturbance ([place]) 12/22/2019  . Renal insufficiency 12/22/2019  . Near syncope 09/04/2019  . AKI (acute kidney injury) ([place]) 09/04/2019  . Hypokalemia 09/04/2019  . Depression 09/04/2019  . Dementia ([place]) 09/04/2019  . Thrombocytopenia ([place]) 09/04/2019  . Low back pain 06/25/2019  . S/P AVR (aortic valve replacement) 12/12/2016  . Aortic stenosis 11/16/2016  . Severe aortic stenosis   . Polycythemia 06/06/2016  . Menopausal state 02/16/2016  . Hyperparathyroidism, primary ([place]) 02/16/2016  . Chronic diastolic CHF (congestive heart failure) ([place]) 05/07/2014  . Hx of colonic polyps 06/17/2013  . Ventricular tachycardia ([place]) 02/21/2011  . Automatic implantable cardioverter-defibrillator in situ 06/02/2009  . Hyperlipidemia 05/29/2009  . SMOKER 05/29/2009  . Essential hypertension 05/29/2009  . Aortic valve disorder 05/29/2009  . CARDIOMYOPATHY, SECONDARY 05/29/2009  . CHF 05/29/2009  . BICUSPID AORTIC VALVE 05/29/2009  . LIVER FUNCTION TESTS, ABNORMAL, HX OF 05/29/2009  . CHOLECYSTITIS, HX OF 05/29/2009    Past Surgical History:  Procedure Laterality Date  . AORTIC VALVE REPLACEMENT N/A 11/16/2016   Procedure: AORTIC VALVE REPLACEMENT (AVR);  Surgeon: Gaye Pollack, MD;  Location: [facility];  Service: Open Heart Surgery;  Laterality: N/A;  4mm Perimount Magna Ease Pericardial Bioprosthesis  . BREAST EXCISIONAL BIOPSY Left 1984   benign  . CARDIAC CATHETERIZATION N/A 07/05/2016   Procedure: Right/Left Heart Cath and Coronary Angiography;  Surgeon: Annita Brod  Angelena Form, MD;  Location: [facility];  Service: Cardiovascular;  Laterality: N/A;  . CHOLECYSTECTOMY    . implantation of a Guidant biventricular ICD    . laparoscopic cholecystectomy with intraoperative cholangiogram  2003  . TEE WITHOUT CARDIOVERSION N/A 11/16/2016   Procedure: TRANSESOPHAGEAL ECHOCARDIOGRAM (TEE);  Surgeon: Gaye Pollack, MD;  Location: [facility];  Service: Open Heart Surgery;  Laterality:  N/A;  . TOTAL HIP ARTHROPLASTY Left 12/31/2019   Procedure: TOTAL HIP ARTHROPLASTY ANTERIOR APPROACH;  Surgeon: Rod Can, MD;  [facility];  Service: Orthopedics;  Laterality: Left;     OB History   No obstetric history on file.     Family History  Problem Relation Age of Onset  . Pancreatic cancer Mother   . Colon cancer Maternal Aunt   . Bone cancer Brother   . Heart attack Brother   . Hypercalcemia Neg Hx     Social History   Tobacco Use  . Smoking status: Current Every Day Smoker    Packs/day: 0.25    Years: 40.00    Pack years: 10.00    Types: Cigarettes  . Smokeless tobacco: Never Used  . Tobacco comment: client reports she smokes 1 cigarette per day  Substance Use Topics  . Alcohol use: Yes    Alcohol/week: 0.0 standard drinks    Comment: occasional  . Drug use: No    Home Medications Prior to Admission medications   Medication Sig Start Date End Date Taking? Authorizing Provider  acetaminophen (TYLENOL) 325 MG tablet Take 2 tablets (650 mg total) by mouth 3 (three) times daily. Patient taking differently: Take 325 mg by mouth every 6 (six) hours as needed for mild pain.  01/02/20   Arrien, Jimmy Picket, MD  carbidopa-levodopa (SINEMET IR) 25-100 MG tablet Take 1 tablet by mouth 3 (three) times daily. Patient taking differently: Take 1 tablet by mouth 2 (two) times daily.  01/02/20 02/01/20  Arrien, Jimmy Picket, MD  carvedilol (COREG) 12.5 MG tablet Take 1 tablet by mouth 2 (two) times a day. With food 12/16/18   [provider]  donepezil (ARICEPT) 10 MG tablet Take 1 tablet (10 mg total) by mouth daily. 01/02/20 02/01/20  Arrien, Jimmy Picket, MD  furosemide (LASIX) 20 MG tablet Take 1 tablet by mouth daily. 01/08/19   [provider]  guaifenesin (ROBITUSSIN) 100 MG/5ML syrup Take 200 mg by mouth 4 (four) times daily as needed for cough.    [provider]  loperamide (IMODIUM) 2 MG capsule Take 2 mg by mouth as needed for  diarrhea or loose stools.    [provider]  magnesium hydroxide (MILK OF MAGNESIA) 400 MG/5ML suspension Take 30 mLs by mouth at bedtime as needed for mild constipation.    [provider]  Multiple Vitamin (MULTIVITAMIN WITH MINERALS) TABS tablet Take 1 tablet by mouth daily.     [provider]  Nutritional Supplements (NUTRITIONAL DRINK PO) Take 4 oz by mouth 2 (two) times daily.    [provider]  olmesartan (BENICAR) 20 MG tablet Take 20 mg by mouth daily. 10/04/19   [provider]  oxyCODONE (OXY IR/ROXICODONE) 5 MG immediate release tablet Take 0.5 tablets (2.5 mg total) by mouth every 4 (four) hours as needed for moderate pain. Patient not taking: Reported on 01/20/2020 01/02/20   Arrien, Jimmy Picket, MD  OXYGEN Inhale 2 L into the lungs as needed (every hr).     [provider]  Polyethyl Glycol-Propyl Glycol (  SYSTANE OP) Place 1-2 drops into both eyes 3 (three) times daily as needed (for dry eyes.).    [provider]  rosuvastatin (CRESTOR) 10 MG tablet Take 1 tablet (10 mg total) by mouth daily. 05/01/11   Evans Lance, MD  sertraline (ZOLOFT) 100 MG tablet Take 1 tablet (100 mg total) by mouth daily. 02/28/19   Cameron Sprang, MD    Allergies    Trospium chloride er  Review of Systems   Review of Systems  All other systems reviewed and are negative.   Physical Exam Updated Vital Signs BP (!) 153/90   Pulse 67   Temp 98.8 F (37.1 C) (Oral)   SpO2 98%   Physical Exam Vitals and nursing note reviewed.  Constitutional:      General: She is not in acute distress.    Appearance: Normal appearance. She is well-developed and normal weight.  HENT:     Head: Normocephalic and atraumatic.  Eyes:     Pupils: Pupils are equal, round, and reactive to light.  Cardiovascular:     Rate and Rhythm: Normal rate and regular rhythm.     Heart sounds: Murmur present. Systolic murmur present with a grade of 2/6. No  friction rub.  Pulmonary:     Effort: Pulmonary effort is normal.     Breath sounds: Normal breath sounds. No wheezing or rales.  Abdominal:     General: Bowel sounds are normal. There is no distension.     Palpations: Abdomen is soft.     Tenderness: There is no abdominal tenderness. There is no guarding or rebound.  Musculoskeletal:        General: No tenderness. Normal range of motion.     Cervical back: Normal range of motion and neck supple.     Right lower leg: No edema.     Left lower leg: No edema.     Comments: No edema.  Full range of motion of bilateral hips, knees and ankles without pain.  Full range of motion of shoulders, elbows and wrist without pain or signs of injury.  Skin:    General: Skin is warm and dry.     Findings: No rash.  Neurological:     Mental Status: She is alert.     Cranial Nerves: No cranial nerve deficit.     Comments: Oriented x2.  Moving all extremities without difficulty.  5 out of 5 strength throughout.  Sensation intact.  Psychiatric:     Comments: Calm and cooperative     ED Results / Procedures / Treatments   Labs (all labs ordered are listed, but only abnormal results are displayed) Labs Reviewed - No data to display  EKG None  Radiology No results found.  Procedures Procedures (including critical care time)  Medications Ordered in ED Medications - No data to display  ED Course  I have reviewed the triage vital signs and the nursing notes.  Pertinent labs & imaging results that were available during my care of the patient were reviewed by me and considered in my medical decision making (see chart for details).    MDM Rules/Calculators/A&P                      Patient with an unwitnessed fall at her skilled facility today.  Patient denies hitting her head or loss of consciousness.  She has no external signs of trauma and has no complaints at this time.  Patient does not take  anticoagulation.  She has had history of similar  in the past.  Given patient has no evidence of trauma and has no complaints of pain.  Vital signs are reassuring and patient was discharged back to facility.  Final Clinical Impression(s) / ED Diagnoses Final diagnoses:  Fall, initial encounter    Rx / DC Orders ED Discharge Orders    None       Blanchie Dessert, MD 02/17/20 2038

## 2020-02-17 NOTE — ED Triage Notes (Signed)
Pt BIB EMS from [facility]. EMS reports pt had an unwitnessed fall. Pt states she did fall once, but doesn't remember the whole fall. Pt states she doesn't think she hit her head. Pt has no complaints and denies pain. A&O x 4.

## 2020-02-17 NOTE — ED Notes (Signed)
Per MD, pt will be discharged. PTAR called for transport to [facility]. This RN attempted to call report to receiving staff at [facility], but no answer.

## 2020-02-18 DIAGNOSIS — M6281 Muscle weakness (generalized): Secondary | ICD-10-CM | POA: Diagnosis not present

## 2020-02-18 DIAGNOSIS — R293 Abnormal posture: Secondary | ICD-10-CM | POA: Diagnosis not present

## 2020-02-18 DIAGNOSIS — R2681 Unsteadiness on feet: Secondary | ICD-10-CM | POA: Diagnosis not present

## 2020-02-18 DIAGNOSIS — R278 Other lack of coordination: Secondary | ICD-10-CM | POA: Diagnosis not present

## 2020-02-19 ENCOUNTER — Other Ambulatory Visit: Payer: Self-pay | Admitting: *Deleted

## 2020-02-19 NOTE — Patient Outreach (Addendum)
[facility]) Care Management  02/19/2020  BAILEY FAIELLA 1956/05/06 [id number]   CSW made contact with pt's son, Cheryle Horsfall, by phone and confirmed pt's identity. CSW introduced self, role and reason for call. Per son, his mother resides at [facility] ALF/memory care.  Per son's report, pt's Social Security check [phone]) is used to pay for her room/board and she does not have Medicaid.  Per son, he wants to get her on Medicaid but reports she is $$0 over income because of a pension check the records show she should be receiving at about $300 but the son knows nothing about.  CSW has instructed son to look into the pension check by calling her previous employer's HR department([place]) to inquire about this.  CSW discussed at length with son the process for applying for Medicaid including the different guidelines for different types/different levels of care.  Pt's son is also interested in seeing if pt is appropriate for a higher level of care; possibly skilled.  CSW offered to call the ALF where pt is residing to discuss further her status, needs, etc.  Son appreciative of assistance. CSW will plan to call pt's son back after contact is made with ALF to further discuss and plan appropriately with son.   [name], MSW, [place] Worker  [facility] [phone]

## 2020-02-20 DIAGNOSIS — R293 Abnormal posture: Secondary | ICD-10-CM | POA: Diagnosis not present

## 2020-02-20 DIAGNOSIS — M6281 Muscle weakness (generalized): Secondary | ICD-10-CM | POA: Diagnosis not present

## 2020-02-20 DIAGNOSIS — R278 Other lack of coordination: Secondary | ICD-10-CM | POA: Diagnosis not present

## 2020-02-23 ENCOUNTER — Emergency Department (HOSPITAL_COMMUNITY): Payer: HMO

## 2020-02-23 ENCOUNTER — Other Ambulatory Visit: Payer: Self-pay

## 2020-02-23 ENCOUNTER — Emergency Department (HOSPITAL_COMMUNITY)
Admission: EM | Admit: 2020-02-23 | Discharge: 2020-02-23 | Disposition: A | Discharge status: Home / Self Care | Payer: HMO | Attending: Emergency Medicine | Admitting: Emergency Medicine

## 2020-02-23 DIAGNOSIS — I10 Essential (primary) hypertension: Secondary | ICD-10-CM | POA: Diagnosis not present

## 2020-02-23 DIAGNOSIS — Y939 Activity, unspecified: Secondary | ICD-10-CM | POA: Diagnosis not present

## 2020-02-23 DIAGNOSIS — S0990XA Unspecified injury of head, initial encounter: Secondary | ICD-10-CM | POA: Diagnosis not present

## 2020-02-23 DIAGNOSIS — I509 Heart failure, unspecified: Secondary | ICD-10-CM | POA: Diagnosis not present

## 2020-02-23 DIAGNOSIS — F17210 Nicotine dependence, cigarettes, uncomplicated: Secondary | ICD-10-CM | POA: Diagnosis not present

## 2020-02-23 DIAGNOSIS — Z79899 Other long term (current) drug therapy: Secondary | ICD-10-CM | POA: Insufficient documentation

## 2020-02-23 DIAGNOSIS — Y999 Unspecified external cause status: Secondary | ICD-10-CM | POA: Insufficient documentation

## 2020-02-23 DIAGNOSIS — M25559 Pain in unspecified hip: Secondary | ICD-10-CM | POA: Diagnosis not present

## 2020-02-23 DIAGNOSIS — R278 Other lack of coordination: Secondary | ICD-10-CM | POA: Diagnosis not present

## 2020-02-23 DIAGNOSIS — R293 Abnormal posture: Secondary | ICD-10-CM | POA: Diagnosis not present

## 2020-02-23 DIAGNOSIS — I2510 Atherosclerotic heart disease of native coronary artery without angina pectoris: Secondary | ICD-10-CM | POA: Diagnosis not present

## 2020-02-23 DIAGNOSIS — M6281 Muscle weakness (generalized): Secondary | ICD-10-CM | POA: Diagnosis not present

## 2020-02-23 DIAGNOSIS — Z95810 Presence of automatic (implantable) cardiac defibrillator: Secondary | ICD-10-CM | POA: Diagnosis not present

## 2020-02-23 DIAGNOSIS — R52 Pain, unspecified: Secondary | ICD-10-CM | POA: Diagnosis not present

## 2020-02-23 DIAGNOSIS — W19XXXA Unspecified fall, initial encounter: Secondary | ICD-10-CM | POA: Insufficient documentation

## 2020-02-23 DIAGNOSIS — Y92129 Unspecified place in nursing home as the place of occurrence of the external cause: Secondary | ICD-10-CM | POA: Insufficient documentation

## 2020-02-23 DIAGNOSIS — Z743 Need for continuous supervision: Secondary | ICD-10-CM | POA: Diagnosis not present

## 2020-02-23 DIAGNOSIS — R2681 Unsteadiness on feet: Secondary | ICD-10-CM | POA: Diagnosis not present

## 2020-02-23 DIAGNOSIS — R279 Unspecified lack of coordination: Secondary | ICD-10-CM | POA: Diagnosis not present

## 2020-02-23 DIAGNOSIS — S3993XA Unspecified injury of pelvis, initial encounter: Secondary | ICD-10-CM | POA: Diagnosis not present

## 2020-02-23 NOTE — Discharge Instructions (Addendum)
[name] had a fall.  CT scan of the brain and x-ray of her hip did not reveal any evidence of new injuries. Please take precautions in preventing further fall/ injuries.

## 2020-02-23 NOTE — ED Notes (Signed)
ptar notified for transport

## 2020-02-23 NOTE — ED Triage Notes (Signed)
Pt arrives via EMS from [facility] with c/o of a fall. Pt not ambulatory at baseline however likes to try and get up. Alert and oriented X3 at baseline. Not on blood thinners. Right hip pain reported by [facility]. Pt denying pain at this time.

## 2020-02-23 NOTE — ED Notes (Signed)
[phone] chris grandson please call him for an update

## 2020-02-24 ENCOUNTER — Ambulatory Visit: Payer: HMO | Admitting: Pharmacist

## 2020-02-25 DIAGNOSIS — R278 Other lack of coordination: Secondary | ICD-10-CM | POA: Diagnosis not present

## 2020-02-25 DIAGNOSIS — M6281 Muscle weakness (generalized): Secondary | ICD-10-CM | POA: Diagnosis not present

## 2020-02-25 DIAGNOSIS — R293 Abnormal posture: Secondary | ICD-10-CM | POA: Diagnosis not present

## 2020-02-25 DIAGNOSIS — R2681 Unsteadiness on feet: Secondary | ICD-10-CM | POA: Diagnosis not present

## 2020-02-26 ENCOUNTER — Ambulatory Visit: Payer: Self-pay | Admitting: *Deleted

## 2020-02-26 ENCOUNTER — Ambulatory Visit (INDEPENDENT_AMBULATORY_CARE_PROVIDER_SITE_OTHER): Payer: HMO | Admitting: *Deleted

## 2020-02-26 DIAGNOSIS — I428 Other cardiomyopathies: Secondary | ICD-10-CM | POA: Diagnosis not present

## 2020-02-26 NOTE — Telephone Encounter (Signed)
The pt has received her new monitor. She transmitted successfully. I added her to the schedule.

## 2020-02-27 ENCOUNTER — Other Ambulatory Visit: Payer: Self-pay

## 2020-02-27 LAB — CUP PACEART REMOTE DEVICE CHECK
Battery Remaining Longevity: 24 mo
Battery Remaining Percentage: 41 %
Brady Statistic RA Percent Paced: 0 %
Brady Statistic RV Percent Paced: 100 %
Date Time Interrogation Session: 20210603143100
HighPow Impedance: 50 Ohm
Implantable Lead Implant Date: 20060630
Implantable Lead Implant Date: 20060630
Implantable Lead Implant Date: 20060630
Implantable Lead Location: 753858
Implantable Lead Location: 753859
Implantable Lead Location: 753860
Implantable Lead Model: 157
Implantable Lead Model: 4086
Implantable Lead Model: 4543
Implantable Lead Serial Number: 119717
Implantable Lead Serial Number: 131677
Implantable Lead Serial Number: 223436
Implantable Pulse Generator Implant Date: 20120131
Lead Channel Impedance Value: 1426 Ohm
Lead Channel Impedance Value: 411 Ohm
Lead Channel Impedance Value: 492 Ohm
Lead Channel Pacing Threshold Amplitude: 1 V
Lead Channel Pacing Threshold Amplitude: 1.2 V
Lead Channel Pacing Threshold Amplitude: 1.2 V
Lead Channel Pacing Threshold Pulse Width: 0.4 ms
Lead Channel Pacing Threshold Pulse Width: 0.4 ms
Lead Channel Pacing Threshold Pulse Width: 0.4 ms
Lead Channel Setting Pacing Amplitude: 2 V
Lead Channel Setting Pacing Amplitude: 2.2 V
Lead Channel Setting Pacing Amplitude: 2.4 V
Lead Channel Setting Pacing Pulse Width: 0.4 ms
Lead Channel Setting Pacing Pulse Width: 0.4 ms
Lead Channel Setting Sensing Sensitivity: 0.5 mV
Lead Channel Setting Sensing Sensitivity: 1 mV
Pulse Gen Serial Number: 498164

## 2020-02-27 NOTE — Patient Outreach (Signed)
  [facility]) Care Management Chronic Special Needs Program    02/27/2020  Name: [name], DOB: Sep 10, 1956  MRN: [id number]   [name] is enrolled in a chronic special needs plan for Diabetes. Telephone call to [facility] assisted living facility.  Contact made with Jenny Reichmann who states client is scheduled to have a follow up with with Facility doctor on Monday 03/01/20 status post fall/ ED visit. Cindy states staff, physical therapy, and doctor continue ongoing collaboration to provide client with safe care and the ability for her to remain in the facility.  RNCM called and updated Education officer, museum.  [name] RN,BSN,CCM [facility] Care Management [phone]

## 2020-03-01 DIAGNOSIS — M6281 Muscle weakness (generalized): Secondary | ICD-10-CM | POA: Diagnosis not present

## 2020-03-01 DIAGNOSIS — R2681 Unsteadiness on feet: Secondary | ICD-10-CM | POA: Diagnosis not present

## 2020-03-01 NOTE — ED Provider Notes (Signed)
[facility] EMERGENCY DEPARTMENT Provider Note   CSN: [id number] Arrival date & time: 02/23/20  1724     History Chief Complaint  Patient presents with  . Fall    [name] is a 64 y.o. female.  HPI    64 y/o comes in with cc of fall. Pt has hx of chf with aicd placement, htn. According to the SNF patient is not ambulatory at baseline and had a fall that was witnessed by residents. PT has c/o hip pain. She has no headaches, neck pain, numbness, tingling.  Past Medical History:  Diagnosis Date  . Abnormal liver function tests   . AICD (automatic cardioverter/defibrillator) present    [employer]  . Anxiety   . Aortic stenosis   . Benign neoplasm of colon 04/19/2001   Hyperplastic  . Bicuspid aortic valve   . Cardiomyopathy secondary   . CHF (congestive heart failure) ([place])   . Coronary artery disease   . Depression   . Headache   . Heart murmur   . HTN (hypertension)   . Hypercalcemia   . Hyperlipidemia   . Memory changes 06/2018   in the last year /per the brother  . S/P ICD (internal cardiac defibrillator) procedure 2018    Patient Active Problem List   Diagnosis Date Noted  . Closed left hip fracture, initial encounter ([place]) 12/30/2019  . Frequent falls   . Orthostatic hypotension 12/23/2019  . Syncopal episodes 12/22/2019  . Dementia due to Parkinson's disease without behavioral disturbance ([place]) 12/22/2019  . Renal insufficiency 12/22/2019  . Near syncope 09/04/2019  . AKI (acute kidney injury) ([place]) 09/04/2019  . Hypokalemia 09/04/2019  . Depression 09/04/2019  . Dementia ([place]) 09/04/2019  . Thrombocytopenia ([place]) 09/04/2019  . Low back pain 06/25/2019  . S/P AVR (aortic valve replacement) 12/12/2016  . Aortic stenosis 11/16/2016  . Severe aortic stenosis   . Polycythemia 06/06/2016  . Menopausal state 02/16/2016  . Hyperparathyroidism, primary ([place]) 02/16/2016  . Chronic diastolic CHF (congestive heart  failure) ([place]) 05/07/2014  . Hx of colonic polyps 06/17/2013  . Ventricular tachycardia ([place]) 02/21/2011  . Automatic implantable cardioverter-defibrillator in situ 06/02/2009  . Hyperlipidemia 05/29/2009  . SMOKER 05/29/2009  . Essential hypertension 05/29/2009  . Aortic valve disorder 05/29/2009  . CARDIOMYOPATHY, SECONDARY 05/29/2009  . CHF 05/29/2009  . BICUSPID AORTIC VALVE 05/29/2009  . LIVER FUNCTION TESTS, ABNORMAL, HX OF 05/29/2009  . CHOLECYSTITIS, HX OF 05/29/2009    Past Surgical History:  Procedure Laterality Date  . AORTIC VALVE REPLACEMENT N/A 11/16/2016   Procedure: AORTIC VALVE REPLACEMENT (AVR);  Surgeon: Gaye Pollack, MD;  Location: [facility];  Service: Open Heart Surgery;  Laterality: N/A;  11mm Perimount Magna Ease Pericardial Bioprosthesis  . BREAST EXCISIONAL BIOPSY Left 1984   benign  . CARDIAC CATHETERIZATION N/A 07/05/2016   Procedure: Right/Left Heart Cath and Coronary Angiography;  Surgeon: Burnell Blanks, MD;  Location: [facility] CV LAB;  Service: Cardiovascular;  Laterality: N/A;  . CHOLECYSTECTOMY    . implantation of a Guid[employer] biventricular ICD    . laparoscopic cholecystectomy with intraoperative cholangiogram  2003  . TEE WITHOUT CARDIOVERSION N/A 11/16/2016   Procedure: TRANSESOPHAGEAL ECHOCARDIOGRAM (TEE);  Surgeon: Gaye Pollack, MD;  Location: [facility];  Service: Open Heart Surgery;  Laterality: N/A;  . TOTAL HIP ARTHROPLASTY Left 12/31/2019   Procedure: TOTAL HIP ARTHROPLASTY ANTERIOR APPROACH;  Surgeon: Rod Can, MD;  [facility];  Service: Orthopedics;  Laterality: Left;  OB History   No obstetric history on file.     Family History  Problem Relation Age of Onset  . Pancreatic cancer Mother   . Colon cancer Maternal Aunt   . Bone cancer Brother   . Heart attack Brother   . Hypercalcemia Neg Hx     Social History   Tobacco Use  . Smoking status: Current Every Day Smoker    Packs/day: 0.25    Years: 40.00     Pack years: 10.00    Types: Cigarettes  . Smokeless tobacco: Never Used  . Tobacco comment: client reports she smokes 1 cigarette per day  Substance Use Topics  . Alcohol use: Yes    Alcohol/week: 0.0 standard drinks    Comment: occasional  . Drug use: No    Home Medications Prior to Admission medications   Medication Sig Start Date End Date Taking? Authorizing Provider  acetaminophen (TYLENOL) 325 MG tablet Take 2 tablets (650 mg total) by mouth 3 (three) times daily. Patient taking differently: Take 325 mg by mouth every 6 (six) hours as needed for mild pain.  01/02/20   Arrien, Jimmy Picket, MD  carbidopa-levodopa (SINEMET IR) 25-100 MG tablet Take 1 tablet by mouth 3 (three) times daily. Patient taking differently: Take 1 tablet by mouth 2 (two) times daily.  01/02/20 02/01/20  Arrien, Jimmy Picket, MD  carvedilol (COREG) 12.5 MG tablet Take 1 tablet by mouth 2 (two) times a day. With food 12/16/18   [provider]  donepezil (ARICEPT) 10 MG tablet Take 1 tablet (10 mg total) by mouth daily. 01/02/20 02/01/20  Arrien, Jimmy Picket, MD  furosemide (LASIX) 20 MG tablet Take 1 tablet by mouth daily. 01/08/19   [provider]  guaifenesin (ROBITUSSIN) 100 MG/5ML syrup Take 200 mg by mouth 4 (four) times daily as needed for cough.    [provider]  loperamide (IMODIUM) 2 MG capsule Take 2 mg by mouth as needed for diarrhea or loose stools.    [provider]  magnesium hydroxide (MILK OF MAGNESIA) 400 MG/5ML suspension Take 30 mLs by mouth at bedtime as needed for mild constipation.    [provider]  Multiple Vitamin (MULTIVITAMIN WITH MINERALS) TABS tablet Take 1 tablet by mouth daily.     [provider]  Nutritional Supplements (NUTRITIONAL DRINK PO) Take 4 oz by mouth 2 (two) times daily.    [provider]  olmesartan (BENICAR) 20 MG tablet Take 20 mg by mouth daily. 10/04/19   [provider]  oxyCODONE (OXY  IR/ROXICODONE) 5 MG immediate release tablet Take 0.5 tablets (2.5 mg total) by mouth every 4 (four) hours as needed for moderate pain. Patient not taking: Reported on 01/20/2020 01/02/20   Arrien, Jimmy Picket, MD  OXYGEN Inhale 2 L into the lungs as needed (every hr).     [provider]  Polyethyl Glycol-Propyl Glycol (SYSTANE OP) Place 1-2 drops into both eyes 3 (three) times daily as needed (for dry eyes.).    [provider]  rosuvastatin (CRESTOR) 10 MG tablet Take 1 tablet (10 mg total) by mouth daily. 05/01/11   Evans Lance, MD  sertraline (ZOLOFT) 100 MG tablet Take 1 tablet (100 mg total) by mouth daily. 02/28/19   Cameron Sprang, MD    Allergies    Trospium chloride er  Review of Systems   Review of Systems  Constitutional: Positive for activity change.  Gastrointestinal: Positive for abdominal pain.  Musculoskeletal: Negative for neck  pain.  Neurological: Negative for headaches.  Hematological: Does not bruise/bleed easily.  All other systems reviewed and are negative.   Physical Exam Updated Vital Signs BP (!) 179/82 (BP Location: Left Arm)   Pulse 69   Temp 98.7 F (37.1 C) (Oral)   Resp 18   Ht 5\' 4"  (1.626 m)   Wt 60.9 kg   SpO2 100%   BMI 23.05 kg/m   Physical Exam Vitals and nursing note reviewed.  Constitutional:      Appearance: She is well-developed.  HENT:     Head: Normocephalic and atraumatic.  Cardiovascular:     Rate and Rhythm: Normal rate.  Pulmonary:     Effort: Pulmonary effort is normal.  Abdominal:     General: Bowel sounds are normal.  Musculoskeletal:        General: Tenderness present. No deformity.     Cervical back: Normal range of motion and neck supple.  Skin:    General: Skin is warm and dry.  Neurological:     Mental Status: She is alert and oriented to person, place, and time.     ED Results / Procedures / Treatments   Labs (all labs ordered are listed, but only abnormal results are  displayed) Labs Reviewed - No data to display  EKG None  Radiology No results found.  Procedures Procedures (including critical care time)  Medications Ordered in ED Medications - No data to display  ED Course  I have reviewed the triage vital signs and the nursing notes.  Pertinent labs & imaging results that were available during my care of the patient were reviewed by me and considered in my medical decision making (see chart for details).    MDM Rules/Calculators/A&P                      DDx includes: - Mechanical falls - ICH - Fractures - Contusions - Soft tissue injury  Pt had a fall. Imaging is reassuring. The patient appears reasonably screened and/or stabilized for discharge and I doubt any other medical condition or other [facility] requiring further screening, evaluation, or treatment in the ED at this time prior to discharge.   Results from the ER workup discussed with the patient face to face and all questions answered to the best of my ability. The patient is safe for discharge with strict return precautions.   Final Clinical Impression(s) / ED Diagnoses Final diagnoses:  Fall, initial encounter    Rx / DC Orders ED Discharge Orders    None       Varney Biles, MD 03/01/20 1821

## 2020-03-02 ENCOUNTER — Emergency Department (HOSPITAL_COMMUNITY)
Admission: EM | Admit: 2020-03-02 | Discharge: 2020-03-02 | Disposition: A | Discharge status: Home / Self Care | Payer: HMO | Attending: Emergency Medicine | Admitting: Emergency Medicine

## 2020-03-02 DIAGNOSIS — R296 Repeated falls: Secondary | ICD-10-CM | POA: Insufficient documentation

## 2020-03-02 DIAGNOSIS — Z7409 Other reduced mobility: Secondary | ICD-10-CM | POA: Diagnosis not present

## 2020-03-02 DIAGNOSIS — I110 Hypertensive heart disease with heart failure: Secondary | ICD-10-CM | POA: Diagnosis not present

## 2020-03-02 DIAGNOSIS — G20 Parkinson's disease: Secondary | ICD-10-CM | POA: Diagnosis not present

## 2020-03-02 DIAGNOSIS — Z888 Allergy status to other drugs, medicaments and biological substances status: Secondary | ICD-10-CM | POA: Insufficient documentation

## 2020-03-02 DIAGNOSIS — Z79899 Other long term (current) drug therapy: Secondary | ICD-10-CM | POA: Diagnosis not present

## 2020-03-02 DIAGNOSIS — I2510 Atherosclerotic heart disease of native coronary artery without angina pectoris: Secondary | ICD-10-CM | POA: Insufficient documentation

## 2020-03-02 DIAGNOSIS — R55 Syncope and collapse: Secondary | ICD-10-CM | POA: Diagnosis not present

## 2020-03-02 DIAGNOSIS — I509 Heart failure, unspecified: Secondary | ICD-10-CM | POA: Diagnosis not present

## 2020-03-02 DIAGNOSIS — F0391 Unspecified dementia with behavioral disturbance: Secondary | ICD-10-CM | POA: Diagnosis not present

## 2020-03-02 DIAGNOSIS — W19XXXA Unspecified fall, initial encounter: Secondary | ICD-10-CM

## 2020-03-02 DIAGNOSIS — I5032 Chronic diastolic (congestive) heart failure: Secondary | ICD-10-CM | POA: Diagnosis not present

## 2020-03-02 DIAGNOSIS — F410 Panic disorder [episodic paroxysmal anxiety] without agoraphobia: Secondary | ICD-10-CM | POA: Diagnosis not present

## 2020-03-02 DIAGNOSIS — F17210 Nicotine dependence, cigarettes, uncomplicated: Secondary | ICD-10-CM | POA: Diagnosis not present

## 2020-03-02 DIAGNOSIS — E7800 Pure hypercholesterolemia, unspecified: Secondary | ICD-10-CM | POA: Diagnosis not present

## 2020-03-02 DIAGNOSIS — I10 Essential (primary) hypertension: Secondary | ICD-10-CM | POA: Diagnosis not present

## 2020-03-02 DIAGNOSIS — F0390 Unspecified dementia without behavioral disturbance: Secondary | ICD-10-CM | POA: Diagnosis not present

## 2020-03-02 DIAGNOSIS — M545 Low back pain: Secondary | ICD-10-CM | POA: Diagnosis not present

## 2020-03-02 DIAGNOSIS — N393 Stress incontinence (female) (male): Secondary | ICD-10-CM | POA: Diagnosis not present

## 2020-03-02 NOTE — ED Notes (Signed)
Called PTAR to take this patient back home to [facility].

## 2020-03-02 NOTE — Discharge Instructions (Signed)
Patient evaluated with no signs of trauma or tenderness on exam, no neurologic deficits and denies hitting head, no hematoma noted. Social work consulted due to multiple falls. Return if pt has additional falls, complains of pain, appears more confused or any other concerning symptoms noted.

## 2020-03-02 NOTE — ED Triage Notes (Signed)
Patient transported by [facility] from [facility] for unwitnessed fall that occurred today. Recently fell 1 month ago and injured/fractured hip (unknown side). AAO x 2 per baseline. C/o pain to lower back.

## 2020-03-02 NOTE — ED Notes (Signed)
ED Provider at bedside. 

## 2020-03-02 NOTE — ED Provider Notes (Signed)
[facility] DEPT Provider Note   CSN: [id number] Arrival date & time: 03/02/20  1424     History Chief Complaint  Patient presents with  . Fall    [name] is a 64 y.o. female.  [name] is a 64 y.o. female with a history of CHF with AICD placement, hypertension, hyperlipidemia, dementia, who presents from [place] assisted living facility memory.  For evaluation after unwitnessed fall that occurred today.  Patient has been seen multiple times in the last month for similar falls.  Patient states that she would not walk herself to the bathroom and move walking back she thinks she must of tripped or slipped on something and fell. She reports repeatedly that she did not hit her head, she denies any loss of consciousness.  Denies neck or back pain, denies any pain in her chest, shortness of breath or abdominal pain.  Denies any pain in her hips or extremities.  Per facility at baseline patient does not walk, and it looks like many times that she is trying to walk is when she is brought for falls.  At baseline patient is A&O x2 per facility.  Patient denies any other complaints.        Past Medical History:  Diagnosis Date  . Abnormal liver function tests   . AICD (automatic cardioverter/defibrillator) present    [employer]  . Anxiety   . Aortic stenosis   . Benign neoplasm of colon 04/19/2001   Hyperplastic  . Bicuspid aortic valve   . Cardiomyopathy secondary   . CHF (congestive heart failure) ([place])   . Coronary artery disease   . Depression   . Headache   . Heart murmur   . HTN (hypertension)   . Hypercalcemia   . Hyperlipidemia   . Memory changes 06/2018   in the last year /per the brother  . S/P ICD (internal cardiac defibrillator) procedure 2018    Patient Active Problem List   Diagnosis Date Noted  . Closed left hip fracture, initial encounter ([place]) 12/30/2019  . Frequent falls   . Orthostatic  hypotension 12/23/2019  . Syncopal episodes 12/22/2019  . Dementia due to Parkinson's disease without behavioral disturbance ([place]) 12/22/2019  . Renal insufficiency 12/22/2019  . Near syncope 09/04/2019  . AKI (acute kidney injury) ([place]) 09/04/2019  . Hypokalemia 09/04/2019  . Depression 09/04/2019  . Dementia ([place]) 09/04/2019  . Thrombocytopenia ([place]) 09/04/2019  . Low back pain 06/25/2019  . S/P AVR (aortic valve replacement) 12/12/2016  . Aortic stenosis 11/16/2016  . Severe aortic stenosis   . Polycythemia 06/06/2016  . Menopausal state 02/16/2016  . Hyperparathyroidism, primary ([place]) 02/16/2016  . Chronic diastolic CHF (congestive heart failure) ([place]) 05/07/2014  . Hx of colonic polyps 06/17/2013  . Ventricular tachycardia ([place]) 02/21/2011  . Automatic implantable cardioverter-defibrillator in situ 06/02/2009  . Hyperlipidemia 05/29/2009  . SMOKER 05/29/2009  . Essential hypertension 05/29/2009  . Aortic valve disorder 05/29/2009  . CARDIOMYOPATHY, SECONDARY 05/29/2009  . CHF 05/29/2009  . BICUSPID AORTIC VALVE 05/29/2009  . LIVER FUNCTION TESTS, ABNORMAL, HX OF 05/29/2009  . CHOLECYSTITIS, HX OF 05/29/2009    Past Surgical History:  Procedure Laterality Date  . AORTIC VALVE REPLACEMENT N/A 11/16/2016   Procedure: AORTIC VALVE REPLACEMENT (AVR);  Surgeon: Gaye Pollack, MD;  Location: [facility];  Service: Open Heart Surgery;  Laterality: N/A;  67mm Perimount Magna Ease Pericardial Bioprosthesis  . BREAST EXCISIONAL BIOPSY Left 1984   benign  .  CARDIAC CATHETERIZATION N/A 07/05/2016   Procedure: Right/Left Heart Cath and Coronary Angiography;  Surgeon: Burnell Blanks, MD;  Location: [facility];  Service: Cardiovascular;  Laterality: N/A;  . CHOLECYSTECTOMY    . implantation of a Guidant biventricular ICD    . laparoscopic cholecystectomy with intraoperative cholangiogram  2003  . TEE WITHOUT CARDIOVERSION N/A 11/16/2016   Procedure: TRANSESOPHAGEAL  ECHOCARDIOGRAM (TEE);  Surgeon: Gaye Pollack, MD;  Location: [facility];  Service: Open Heart Surgery;  Laterality: N/A;  . TOTAL HIP ARTHROPLASTY Left 12/31/2019   Procedure: TOTAL HIP ARTHROPLASTY ANTERIOR APPROACH;  Surgeon: Rod Can, MD;  Location: [facility];  Service: Orthopedics;  Laterality: Left;     OB History   No obstetric history on file.     Family History  Problem Relation Age of Onset  . Pancreatic cancer Mother   . Colon cancer Maternal Aunt   . Bone cancer Brother   . Heart attack Brother   . Hypercalcemia Neg Hx     Social History   Tobacco Use  . Smoking status: Current Every Day Smoker    Packs/day: 0.25    Years: 40.00    Pack years: 10.00    Types: Cigarettes  . Smokeless tobacco: Never Used  . Tobacco comment: client reports she smokes 1 cigarette per day  Substance Use Topics  . Alcohol use: Yes    Alcohol/week: 0.0 standard drinks    Comment: occasional  . Drug use: No    Home Medications Prior to Admission medications   Medication Sig Start Date End Date Taking? Authorizing Provider  acetaminophen (TYLENOL) 325 MG tablet Take 2 tablets (650 mg total) by mouth 3 (three) times daily. Patient taking differently: Take 325 mg by mouth every 6 (six) hours as needed for mild pain.  01/02/20   Arrien, Jimmy Picket, MD  carbidopa-levodopa (SINEMET IR) 25-100 MG tablet Take 1 tablet by mouth 3 (three) times daily. Patient taking differently: Take 1 tablet by mouth 2 (two) times daily.  01/02/20 02/01/20  Arrien, Jimmy Picket, MD  carvedilol (COREG) 12.5 MG tablet Take 1 tablet by mouth 2 (two) times a day. With food 12/16/18   [provider]  donepezil (ARICEPT) 10 MG tablet Take 1 tablet (10 mg total) by mouth daily. 01/02/20 02/01/20  Arrien, Jimmy Picket, MD  furosemide (LASIX) 20 MG tablet Take 1 tablet by mouth daily. 01/08/19   [provider]  guaifenesin (ROBITUSSIN) 100 MG/5ML syrup Take 200 mg by mouth 4 (four) times daily as  needed for cough.    [provider]  loperamide (IMODIUM) 2 MG capsule Take 2 mg by mouth as needed for diarrhea or loose stools.    [provider]  magnesium hydroxide (MILK OF MAGNESIA) 400 MG/5ML suspension Take 30 mLs by mouth at bedtime as needed for mild constipation.    [provider]  Multiple Vitamin (MULTIVITAMIN WITH MINERALS) TABS tablet Take 1 tablet by mouth daily.     [provider]  Nutritional Supplements (NUTRITIONAL DRINK PO) Take 4 oz by mouth 2 (two) times daily.    [provider]  olmesartan (BENICAR) 20 MG tablet Take 20 mg by mouth daily. 10/04/19   [provider]  oxyCODONE (OXY IR/ROXICODONE) 5 MG immediate release tablet Take 0.5 tablets (2.5 mg total) by mouth every 4 (four) hours as needed for moderate pain. Patient not taking: Reported on 01/20/2020 01/02/20   Arrien, Jimmy Picket, MD  OXYGEN Inhale 2 L into  the lungs as needed (every hr).     [provider]  Polyethyl Glycol-Propyl Glycol (SYSTANE OP) Place 1-2 drops into both eyes 3 (three) times daily as needed (for dry eyes.).    [provider]  rosuvastatin (CRESTOR) 10 MG tablet Take 1 tablet (10 mg total) by mouth daily. 05/01/11   Evans Lance, MD  sertraline (ZOLOFT) 100 MG tablet Take 1 tablet (100 mg total) by mouth daily. 02/28/19   Cameron Sprang, MD    Allergies    Trospium chloride er  Review of Systems   Review of Systems  Constitutional: Negative for chills and fever.  HENT: Negative.   Respiratory: Negative for cough and shortness of breath.   Cardiovascular: Negative for chest pain.  Gastrointestinal: Negative for abdominal pain, nausea and vomiting.  Musculoskeletal: Negative for arthralgias, back pain, myalgias and neck pain.  Skin: Negative for color change and rash.  Neurological: Negative for dizziness, weakness, numbness and headaches.  All other systems reviewed and are negative.   Physical Exam Updated  Vital Signs BP 131/78   Pulse 60   Temp 97.9 F (36.6 C) (Oral)   Resp 15   SpO2 100%   Physical Exam Vitals and nursing note reviewed.  Constitutional:      General: She is not in acute distress.    Appearance: Normal appearance. She is well-developed and normal weight. She is not diaphoretic.     Comments: Elderly female, well appearing and in no distress  HENT:     Head: Normocephalic and atraumatic.     Comments: Scalp without signs of trauma, no palpable hematoma, no step-off, negative battle sign, no evidence of CSF otorrhea     Mouth/Throat:     Mouth: Mucous membranes are moist.     Pharynx: Oropharynx is clear.  Eyes:     General:        Right eye: No discharge.        Left eye: No discharge.     Extraocular Movements: Extraocular movements intact.     Pupils: Pupils are equal, round, and reactive to light.  Neck:     Comments: No midline c-spine, no step off or deformity, full ROM without pain Cardiovascular:     Rate and Rhythm: Normal rate and regular rhythm.     Heart sounds: Normal heart sounds.  Pulmonary:     Effort: Pulmonary effort is normal. No respiratory distress.     Breath sounds: Normal breath sounds. No wheezing or rales.     Comments: Respirations equal and unlabored, patient able to speak in full sentences, lungs clear to auscultation bilaterally, chest wall NTTP Abdominal:     General: Bowel sounds are normal. There is no distension.     Palpations: Abdomen is soft. There is no mass.     Tenderness: There is no abdominal tenderness. There is no guarding.     Comments: Abdomen soft, nondistended, nontender to palpation in all quadrants without guarding or peritoneal signs  Musculoskeletal:        General: No deformity.     Cervical back: Neck supple.     Comments: T-spine and L-spine nontender to palpation at midline. Patient moves all extremities without difficulty. All joints supple and easily movable, no erythema, swelling or palpable  deformity, all compartments soft.  Skin:    General: Skin is warm and dry.     Capillary Refill: Capillary refill takes less than 2 seconds.  Neurological:     Mental  Status: She is alert.     Coordination: Coordination normal.     Comments: Alert and oriented x2, able to follow all commands Cranial nerves grossly intact Moving all extremities with normal coordination, strength 5/5 in bilateral upper and lower extremities Sensation intact in bilateral upper and lower extremities.   Psychiatric:        Mood and Affect: Mood normal.        Behavior: Behavior normal.     ED Results / Procedures / Treatments   Labs (all labs ordered are listed, but only abnormal results are displayed) Labs Reviewed - No data to display  EKG None  Radiology No results found.  Procedures Procedures (including critical care time)  Medications Ordered in ED Medications - No data to display  ED Course  I have reviewed the triage vital signs and the nursing notes.  Pertinent labs & imaging results that were available during my care of the patient were reviewed by me and considered in my medical decision making (see chart for details).    MDM Rules/Calculators/A&P                     Patient with an unwitnessed fall at her skilled facility today.  Patient denies hitting her head or loss of consciousness.  She has no external signs of trauma and has no complaints at this time.  Patient does not take anticoagulation.  She is able to range all joints without pain. Vitals normal and pt is at baseline mental status. She has had history of similar in the past.  Given patient has no evidence of trauma and has no complaints of pain do not feel that imaging or lab evaluation is indicated. Social work consult placed to help facilitate pt receiving additional resources of higher level of care, but feel this can be done as an outpt and pt can be discharged back to facility.    Patient discussed with Dr. Tyrone Nine,  who saw patient as well and agrees with plan.   Final Clinical Impression(s) / ED Diagnoses Final diagnoses:  Fall, initial encounter    Rx / DC Orders ED Discharge Orders    None       Janet Berlin 03/02/20 [place], DO 03/03/20 [phone]

## 2020-03-02 NOTE — Progress Notes (Signed)
Remote ICD transmission.   

## 2020-03-02 NOTE — ED Notes (Signed)
Pt was not able to walk, she could not even stand on her own.

## 2020-03-02 NOTE — Progress Notes (Signed)
TOC CM referral to assist with getting pt back to [facility]. Spoke to pt at bedside. Pt oriented to self and place. Gave permission to speak to son, Cheryle Horsfall. Son states he pays the [facility] directly using her SS check. States she was denied for Medicaid and he was applying for special assistance. Special assistance was denied. Provided son with contact information for [facility] to assist him with Medicaid, POA/legal guardianship etc. States he still had pt's apt and paying bills out of her check, but explained that to son that the entire check goes to [facility]. They are considering releasing her to his care due to non payment. States he will follow up with [facility] to discuss payment. Plans to contact DSS CSW to do appeal for Medicaid. [employer]AR called. Spoke to [employer], Pamala Hurry and they will accept pt back but son will have to work with them on payment and long term plan/care. [place], [place] ED TOC CM [phone]

## 2020-03-03 ENCOUNTER — Other Ambulatory Visit: Payer: Self-pay | Admitting: *Deleted

## 2020-03-03 NOTE — Patient Outreach (Signed)
[facility]) Care Management  03/03/2020  ADELYNE MARCHESE [birth year] [id number]   CSW received a call from pt's son regarding pt's ED visit this week after a fall. Per son, pt was returned to [facility] AL[facility]/memory care and was told, "she is total care".  Per son, he has spoken with the staff at [facility] regarding plans and needs for a higher level of care, as well as the Medicaid component.  CSW again reiterated to the son the need for him to apply for Medicaid for a higher level of care (SNF?) and to advise ALF and CSW once done.  Pt's son indicated he has an FL2 form completed by MD- advised him to look at how long ago it was signed by MD as it may need to updated/revised and signed again by MD.  Pt's son to look for this info and callback.  CSW has also contacted the staff at [facility] who report pt has been denied Medicaid due to "a pension and some automobiles".  ALF staff are attempting to seek a higher level of care (sent a new FL2 out) but no offers yet.   CSW will collaborate with ALF and son to seek options.     [name], MSW, [place] Worker  [place] [phone]

## 2020-03-08 ENCOUNTER — Encounter (HOSPITAL_COMMUNITY): Payer: Self-pay | Admitting: Emergency Medicine

## 2020-03-08 ENCOUNTER — Emergency Department (HOSPITAL_COMMUNITY)
Admission: EM | Admit: 2020-03-08 | Discharge: 2020-03-09 | Disposition: A | Discharge status: Home / Self Care | Payer: HMO | Attending: Emergency Medicine | Admitting: Emergency Medicine

## 2020-03-08 DIAGNOSIS — R5381 Other malaise: Secondary | ICD-10-CM | POA: Diagnosis not present

## 2020-03-08 DIAGNOSIS — R410 Disorientation, unspecified: Secondary | ICD-10-CM | POA: Diagnosis not present

## 2020-03-08 DIAGNOSIS — W19XXXA Unspecified fall, initial encounter: Secondary | ICD-10-CM | POA: Diagnosis not present

## 2020-03-08 DIAGNOSIS — Z79899 Other long term (current) drug therapy: Secondary | ICD-10-CM | POA: Diagnosis not present

## 2020-03-08 DIAGNOSIS — Y92129 Unspecified place in nursing home as the place of occurrence of the external cause: Secondary | ICD-10-CM | POA: Diagnosis not present

## 2020-03-08 DIAGNOSIS — Z043 Encounter for examination and observation following other accident: Secondary | ICD-10-CM | POA: Diagnosis not present

## 2020-03-08 DIAGNOSIS — I110 Hypertensive heart disease with heart failure: Secondary | ICD-10-CM | POA: Diagnosis not present

## 2020-03-08 DIAGNOSIS — I5032 Chronic diastolic (congestive) heart failure: Secondary | ICD-10-CM | POA: Insufficient documentation

## 2020-03-08 DIAGNOSIS — Y939 Activity, unspecified: Secondary | ICD-10-CM | POA: Insufficient documentation

## 2020-03-08 DIAGNOSIS — F17210 Nicotine dependence, cigarettes, uncomplicated: Secondary | ICD-10-CM | POA: Diagnosis not present

## 2020-03-08 DIAGNOSIS — S0990XA Unspecified injury of head, initial encounter: Secondary | ICD-10-CM | POA: Diagnosis not present

## 2020-03-08 DIAGNOSIS — Y999 Unspecified external cause status: Secondary | ICD-10-CM | POA: Insufficient documentation

## 2020-03-08 DIAGNOSIS — Z7982 Long term (current) use of aspirin: Secondary | ICD-10-CM | POA: Diagnosis not present

## 2020-03-08 NOTE — ED Notes (Signed)
Pt called for vitals no answer x3

## 2020-03-08 NOTE — ED Triage Notes (Addendum)
Pt arrives via [facility] from [facility] w/ c/o witnessed fall. Pt has hx of dementia but denies any pain. Pt hit head on medication cart that was nearby but no LOC, staff was at patient's side at time of fall. Not currently on anticoagulants. ccollar in place. Pt a/ox to person, place, and situation, disoriented to time.

## 2020-03-09 DIAGNOSIS — Z7401 Bed confinement status: Secondary | ICD-10-CM | POA: Diagnosis not present

## 2020-03-09 DIAGNOSIS — M2550 Pain in unspecified joint: Secondary | ICD-10-CM | POA: Diagnosis not present

## 2020-03-09 DIAGNOSIS — R55 Syncope and collapse: Secondary | ICD-10-CM | POA: Diagnosis not present

## 2020-03-09 DIAGNOSIS — F0391 Unspecified dementia with behavioral disturbance: Secondary | ICD-10-CM | POA: Diagnosis not present

## 2020-03-09 DIAGNOSIS — I509 Heart failure, unspecified: Secondary | ICD-10-CM | POA: Diagnosis not present

## 2020-03-09 DIAGNOSIS — F0390 Unspecified dementia without behavioral disturbance: Secondary | ICD-10-CM | POA: Diagnosis not present

## 2020-03-09 DIAGNOSIS — R296 Repeated falls: Secondary | ICD-10-CM | POA: Diagnosis not present

## 2020-03-09 DIAGNOSIS — Z7409 Other reduced mobility: Secondary | ICD-10-CM | POA: Diagnosis not present

## 2020-03-09 DIAGNOSIS — N393 Stress incontinence (female) (male): Secondary | ICD-10-CM | POA: Diagnosis not present

## 2020-03-09 DIAGNOSIS — R410 Disorientation, unspecified: Secondary | ICD-10-CM | POA: Diagnosis not present

## 2020-03-09 DIAGNOSIS — Z79899 Other long term (current) drug therapy: Secondary | ICD-10-CM | POA: Diagnosis not present

## 2020-03-09 DIAGNOSIS — W19XXXA Unspecified fall, initial encounter: Secondary | ICD-10-CM | POA: Diagnosis not present

## 2020-03-09 DIAGNOSIS — I950 Idiopathic hypotension: Secondary | ICD-10-CM | POA: Diagnosis not present

## 2020-03-09 DIAGNOSIS — R4182 Altered mental status, unspecified: Secondary | ICD-10-CM | POA: Diagnosis not present

## 2020-03-09 DIAGNOSIS — G20 Parkinson's disease: Secondary | ICD-10-CM | POA: Diagnosis not present

## 2020-03-09 DIAGNOSIS — E7800 Pure hypercholesterolemia, unspecified: Secondary | ICD-10-CM | POA: Diagnosis not present

## 2020-03-09 NOTE — ED Notes (Signed)
Attempted report to [facility], CNA stated all nurses were busy staff was made aware that this patient was returning, CNA stated she would get the nurse to call back

## 2020-03-09 NOTE — ED Provider Notes (Signed)
[facility] EMERGENCY DEPARTMENT Provider Note   CSN: [id number] Arrival date & time: 03/08/20  1746     History Chief Complaint  Patient presents with  . Fall    [name] is a 64 y.o. female.  Patient presents to the emergency department for evaluation after a fall.  Patient comes from [facility] care after suffering a witnessed fall.  Staff report that she hit her head on a medication cart but did not lose consciousness.  She is not on anticoagulation medications.  At time of examination patient is without complaints.        Past Medical History:  Diagnosis Date  . Abnormal liver function tests   . AICD (automatic cardioverter/defibrillator) present    [employer]  . Anxiety   . Aortic stenosis   . Benign neoplasm of colon 04/19/2001   Hyperplastic  . Bicuspid aortic valve   . Cardiomyopathy secondary   . CHF (congestive heart failure) ([place])   . Coronary artery disease   . Depression   . Headache   . Heart murmur   . HTN (hypertension)   . Hypercalcemia   . Hyperlipidemia   . Memory changes 06/2018   in the last year /per the brother  . S/P ICD (internal cardiac defibrillator) procedure 2018    Patient Active Problem List   Diagnosis Date Noted  . Closed left hip fracture, initial encounter ([place]) 12/30/2019  . Frequent falls   . Orthostatic hypotension 12/23/2019  . Syncopal episodes 12/22/2019  . 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 Renal insufficiency 12/22/2019  . Near syncope 09/04/2019  . AKI (acute kidney injury) ([place]) 09/04/2019  . Hypokalemia 09/04/2019  . Depression 09/04/2019  . Dementia ([place]) 09/04/2019  . Thrombocytopenia ([place]) 09/04/2019  . Low back pain 06/25/2019  . S/P AVR (aortic valve replacement) 12/12/2016  . Aortic stenosis 11/16/2016  . Severe aortic stenosis   . Polycythemia 06/06/2016  . Menopausal state 02/16/2016  .  Hyperparathyroidism, primary ([place]) 02/16/2016  . Chronic diastolic CHF (congestive heart failure) ([place]) 05/07/2014  . Hx of colonic polyps 06/17/2013  . Ventricular tachycardia ([place]) 02/21/2011  . Automatic implantable cardioverter-defibrillator in situ 06/02/2009  . Hyperlipidemia 05/29/2009  . SMOKER 05/29/2009  . Essential hypertension 05/29/2009  . Aortic valve disorder 05/29/2009  . CARDIOMYOPATHY, SECONDARY 05/29/2009  . CHF 05/29/2009  . BICUSPID AORTIC VALVE 05/29/2009  . LIVER FUNCTION TESTS, ABNORMAL, HX OF 05/29/2009  . CHOLECYSTITIS, HX OF 05/29/2009    Past Surgical History:  Procedure Laterality Date  . AORTIC VALVE REPLACEMENT N/A 11/16/2016   Procedure: AORTIC VALVE REPLACEMENT (AVR);  Surgeon: Gaye Pollack, MD;  Location: [facility];  Service: Open Heart Surgery;  Laterality: N/A;  46mm Perimount Magna Ease Pericardial Bioprosthesis  . BREAST EXCISIONAL BIOPSY Left 1984   benign  . CARDIAC CATHETERIZATION N/A 07/05/2016   Procedure: Right/Left Heart Cath and Coronary Angiography;  Surgeon: Burnell Blanks, MD;  Location: [facility];  Service: Cardiovascular;  Laterality: N/A;  . CHOLECYSTECTOMY    . implantation of a Guidant biventricular ICD    . laparoscopic cholecystectomy with intraoperative cholangiogram  2003  . TEE WITHOUT CARDIOVERSION N/A 11/16/2016   Procedure: TRANSESOPHAGEAL ECHOCARDIOGRAM (TEE);  Surgeon: Gaye Pollack, MD;  Location: [facility];  Service: Open Heart Surgery;  Laterality: N/A;  . TOTAL HIP ARTHROPLASTY Left 12/31/2019   Procedure: TOTAL HIP ARTHROPLASTY ANTERIOR APPROACH;  Surgeon: Rod Can, MD;  Location[facility];  Service: Orthopedics;  Laterality: Left;     OB History   No obstetric history on file.     Family History  Problem Relation Age of Onset  . Pancreatic cancer Mother   . Colon cancer Maternal Aunt   . Bone cancer Brother   . Heart attack Brother   . Hypercalcemia Neg Hx     Social History   Tobacco  Use  . Smoking status: Current Every Day Smoker    Packs/day: 0.25    Years: 40.00    Pack years: 10.00    Types: Cigarettes  . Smokeless tobacco: Never Used  . Tobacco comment: client reports she smokes 1 cigarette per day  Vaping Use  . Vaping Use: Never used  Substance Use Topics  . Alcohol use: Yes    Alcohol/week: 0.0 standard drinks    Comment: occasional  . Drug use: No    Home Medications Prior to Admission medications   Medication Sig Start Date End Date Taking? Authorizing Provider  alum & mag hydroxide-simeth (MAALOX/MYLANTA) 200-200-20 MG/5ML suspension Take 30 mLs by mouth as needed for indigestion or heartburn.   Yes [provider]  aspirin 325 MG tablet Take 325 mg by mouth daily.   Yes [provider]  carbidopa-levodopa (SINEMET IR) 25-100 MG tablet Take 1 tablet by mouth 3 (three) times daily. 01/02/20 03/09/29 Yes Arrien, Jimmy Picket, MD  carvedilol (COREG) 12.5 MG tablet Take 1 tablet by mouth 2 (two) times a day. With food 12/16/18  Yes [provider]  divalproex (DEPAKOTE SPRINKLE) 125 MG capsule Take 125 mg by mouth daily.   Yes [provider]  donepezil (ARICEPT) 10 MG tablet Take 1 tablet (10 mg total) by mouth daily. 01/02/20 03/09/29 Yes Arrien, Jimmy Picket, MD  furosemide (LASIX) 20 MG tablet Take 20 mg by mouth daily.  01/08/19  Yes [provider]  guaifenesin (ROBITUSSIN) 100 MG/5ML syrup Take 200 mg by mouth 4 (four) times daily as needed for cough.   Yes [provider]  irbesartan (AVAPRO) 150 MG tablet Take 150 mg by mouth daily.   Yes [provider]  loperamide (IMODIUM) 2 MG capsule Take 2 mg by mouth as needed for diarrhea or loose stools.   Yes [provider]  LORazepam (ATIVAN) 0.5 MG tablet Take 0.5 mg by mouth 2 (two) times daily as needed for anxiety or sleep.   Yes [provider]  magnesium hydroxide (MILK OF MAGNESIA) 400 MG/5ML suspension Take 30 mLs by  mouth at bedtime as needed for mild constipation.   Yes [provider]  Multiple Vitamin (MULTIVITAMIN WITH MINERALS) TABS tablet Take 1 tablet by mouth daily.    Yes [provider]  Neomycin-Bacitracin-Polymyxin (TRIPLE ANTIBIOTIC) [phone] OINT Apply 1 application topically daily as needed (for minor skin tears or abrasions).   Yes [provider]  Nutritional Supplements (NUTRITIONAL DRINK PO) Take 4 oz by mouth 2 (two) times daily. Mighty Shakes   Yes [provider]  olmesartan (BENICAR) 20 MG tablet Take 20 mg by mouth daily. 10/04/19  Yes [provider]  Polyethyl Glycol-Propyl Glycol (SYSTANE OP) Place 1 drop into both eyes daily as needed (for dry eyes.).    Yes [provider]  polyethylene glycol (MIRALAX / GLYCOLAX) 17 g packet Take 17 g by mouth daily.   Yes [provider]  rosuvastatin (CRESTOR) 10 MG tablet Take 1 tablet (10 mg total) by mouth daily. 05/01/11  Yes  Evans Lance, MD  sertraline (ZOLOFT) 100 MG tablet Take 1 tablet (100 mg total) by mouth daily. 02/28/19  Yes Cameron Sprang, MD  acetaminophen (TYLENOL) 325 MG tablet Take 2 tablets (650 mg total) by mouth 3 (three) times daily. Patient not taking: Reported on 03/02/2020 01/02/20   Arrien, Jimmy Picket, MD  oxyCODONE (OXY IR/ROXICODONE) 5 MG immediate release tablet Take 0.5 tablets (2.5 mg total) by mouth every 4 (four) hours as needed for moderate pain. Patient not taking: Reported on 03/02/2020 01/02/20   Arrien, Jimmy Picket, MD    Allergies    Trospium chloride er  Review of Systems   Review of Systems  Musculoskeletal: Negative for arthralgias, back pain and neck pain.  Neurological: Negative for syncope and headaches.  All other systems reviewed and are negative.   Physical Exam Updated Vital Signs BP (!) 177/87 (BP Location: Right Arm)   Pulse 76   Temp 98.1 F (36.7 C) (Oral)   Resp 16   SpO2 99%   Physical Exam Vitals and nursing  note reviewed.  Constitutional:      General: She is not in acute distress.    Appearance: Normal appearance. She is well-developed.  HENT:     Head: Normocephalic and atraumatic.     Right Ear: Hearing normal.     Left Ear: Hearing normal.     Nose: Nose normal.  Eyes:     Conjunctiva/sclera: Conjunctivae normal.     Pupils: Pupils are equal, round, and reactive to light.  Cardiovascular:     Rate and Rhythm: Regular rhythm.     Heart sounds: S1 normal and S2 normal. No murmur heard.  No friction rub. No gallop.   Pulmonary:     Effort: Pulmonary effort is normal. No respiratory distress.     Breath sounds: Normal breath sounds.  Chest:     Chest wall: No tenderness.  Abdominal:     General: Bowel sounds are normal.     Palpations: Abdomen is soft.     Tenderness: There is no abdominal tenderness. There is no guarding or rebound. Negative signs include Murphy's sign and McBurney's sign.     Hernia: No hernia is present.  Musculoskeletal:        General: Normal range of motion.     Cervical back: Normal range of motion and neck supple.  Skin:    General: Skin is warm and dry.     Findings: No rash.  Neurological:     Mental Status: She is alert. She is disoriented.     Cranial Nerves: No cranial nerve deficit.     Sensory: No sensory deficit.     Coordination: Coordination normal.  Psychiatric:        Speech: Speech normal.        Behavior: Behavior normal.        Thought Content: Thought content normal.     ED Results / Procedures / Treatments   Labs (all labs ordered are listed, but only abnormal results are displayed) Labs Reviewed - No data to display  EKG None  Radiology No results found.  Procedures Procedures (including critical care time)  Medications Ordered in ED Medications - No data to display  ED Course  I have reviewed the triage vital signs and the nursing notes.  Pertinent labs & imaging results that were available during my care of the  patient were reviewed by me and considered in my medical decision making (see chart for details).  MDM Rules/Calculators/A&P                          Patient presents to the emergency department for evaluation after a witnessed fall.  Patient reportedly did hit her head on the way down but there was no loss of consciousness.  Patient was in the waiting room for approximately 11 hours due to high volume.  By the time I examined her it was approximately 12 hours after the actual fall.  She is awake and without complaints.  The do not see any hematomas on her head.  Neck is nontender to palpation.  Normal range of motion of neck, thoracic and lumbar spine exam is normal, nontender.  I am able to range of motion both of her hips without eliciting any pain.  I do not believe the patient requires any work-up at this time.  Final Clinical Impression(s) / ED Diagnoses Final diagnoses:  Fall, initial encounter    Rx / DC Orders ED Discharge Orders    None       Awesome Jared, Gwenyth Allegra, MD 03/09/20 [phone]

## 2020-03-09 NOTE — ED Notes (Signed)
Called [facility] for transport to [facility]

## 2020-03-11 ENCOUNTER — Ambulatory Visit: Payer: Self-pay | Admitting: *Deleted

## 2020-03-11 ENCOUNTER — Other Ambulatory Visit: Payer: Self-pay

## 2020-03-11 NOTE — Patient Outreach (Deleted)
[facility]) Care Management   03/11/2020  [name] August 17, 1956 [id number]  [name] is an 64 y.o. female  Subjective:   Objective:   Review of Systems  Physical Exam  Encounter Medications:   Outpatient Encounter Medications as of 03/11/2020  Medication Sig  . acetaminophen (TYLENOL) 325 MG tablet Take 2 tablets (650 mg total) by mouth 3 (three) times daily. (Patient not taking: Reported on 03/02/2020)  . alum & mag hydroxide-simeth (MAALOX/MYLANTA) 200-200-20 MG/5ML suspension Take 30 mLs by mouth as needed for indigestion or heartburn.  Marland Kitchen aspirin 325 MG tablet Take 325 mg by mouth daily.  . carbidopa-levodopa (SINEMET IR) 25-100 MG tablet Take 1 tablet by mouth 3 (three) times daily.  . carvedilol (COREG) 12.5 MG tablet Take 1 tablet by mouth 2 (two) times a day. With food  . divalproex (DEPAKOTE SPRINKLE) 125 MG capsule Take 125 mg by mouth daily.  Marland Kitchen donepezil (ARICEPT) 10 MG tablet Take 1 tablet (10 mg total) by mouth daily.  . furosemide (LASIX) 20 MG tablet Take 20 mg by mouth daily.   Marland Kitchen guaifenesin (ROBITUSSIN) 100 MG/5ML syrup Take 200 mg by mouth 4 (four) times daily as needed for cough.  . irbesartan (AVAPRO) 150 MG tablet Take 150 mg by mouth daily.  Marland Kitchen loperamide (IMODIUM) 2 MG capsule Take 2 mg by mouth as needed for diarrhea or loose stools.  Marland Kitchen LORazepam (ATIVAN) 0.5 MG tablet Take 0.5 mg by mouth 2 (two) times daily as needed for anxiety or sleep.  . magnesium hydroxide (MILK OF MAGNESIA) 400 MG/5ML suspension Take 30 mLs by mouth at bedtime as needed for mild constipation.  . Multiple Vitamin (MULTIVITAMIN WITH MINERALS) TABS tablet Take 1 tablet by mouth daily.   Marland Kitchen Neomycin-Bacitracin-Polymyxin (TRIPLE ANTIBIOTIC) [phone] OINT Apply 1 application topically daily as needed (for minor skin tears or abrasions).  . Nutritional Supplements (NUTRITIONAL DRINK PO) Take 4 oz by mouth 2 (two) times daily. Mighty Shakes  . olmesartan (BENICAR)  20 MG tablet Take 20 mg by mouth daily.  Marland Kitchen oxyCODONE (OXY IR/ROXICODONE) 5 MG immediate release tablet Take 0.5 tablets (2.5 mg total) by mouth every 4 (four) hours as needed for moderate pain. (Patient not taking: Reported on 03/02/2020)  . Polyethyl Glycol-Propyl Glycol (SYSTANE OP) Place 1 drop into both eyes daily as needed (for dry eyes.).   Marland Kitchen polyethylene glycol (MIRALAX / GLYCOLAX) 17 g packet Take 17 g by mouth daily.  . rosuvastatin (CRESTOR) 10 MG tablet Take 1 tablet (10 mg total) by mouth daily.  . sertraline (ZOLOFT) 100 MG tablet Take 1 tablet (100 mg total) by mouth daily.   No facility-administered encounter medications on file as of 03/11/2020.    Functional Status:   In your present state of health, do you have any difficulty performing the following activities: 12/30/2019 12/30/2019  Hearing? - Y  Comment - slight hoh  Vision? - N  Difficulty concentrating or making decisions? - Y  Walking or climbing stairs? - Y  Dressing or bathing? - Y  Doing errands, shopping? Y -  Conservation officer, nature and eating ? - -  Using the Toilet? - -  In the past six months, have you accidently leaked urine? - -  Do you have problems with loss of bowel control? - -  Managing your Medications? - -  Managing your Finances? - -  Housekeeping or managing your Housekeeping? - -  Some recent data might be hidden    Fall/Depression Screening:  Fall Risk  02/20/2019 07/03/2018 03/26/2018  Falls in the past year? 0 Yes Yes  Number falls in past yr: 0 2 or more 1  Injury with Fall? 0 No No  Risk Factor Category  - High Fall Risk -   PHQ 2/9 Scores 09/03/2019 02/20/2019 02/12/2019 05/30/2017 01/29/2017  PHQ - 2 Score 6 5 6 1 1   PHQ- 9 Score 21 16 17  - -    Assessment:    Plan:

## 2020-03-11 NOTE — Patient Outreach (Signed)
  [facility]) Care Management Chronic Special Needs Program  03/11/2020  Name: [name] DOB: October 30, 1955  MRN: [id number]  [name] is enrolled in a chronic special needs plan for Heart Failure.  Care coordination call made to [facility] at [facility] assisted living.  Individualized care plan reviewed and updated.  Subjective:  Goals Addressed              This Visit's Progress   .  Client will report no fall or injuries within the next 3 months (pt-stated)   Not on track     Client has had 5 falls resulting in ED visit admissions (01/20/20, 02/17/20, 02/23/20, 03/02/20 and 03/09/20.  RN case manager contacted [facility] to follow up on clients physical therapy services. Client physical therapy services at [facility] were 02/10/20 to 03/01/20.  RN case Freight forwarder confirmed with [name] at [facility]  current plan to assist client with fall prevention will be to use a Lap Buddy.      .  COMPLETED: General - Client will not be readmitted within 30 days (C-SNP)        Please follow discharge instructions and call provider if you have any questions. Please attend all follow up appointments as scheduled. Please take your medications as prescribed. Please call 24 Hour nurse advice line as needed [phone]).  Client not readmitted to hospital        Plan: RNCM will follow up with client at next scheduled outreach and will continue to follow as clients Case manager care coordinator.    [name] RN,BSN,CCM Chronic Care Management Coordinator [place] Management [phone]    .

## 2020-03-12 ENCOUNTER — Ambulatory Visit: Payer: Self-pay

## 2020-03-12 ENCOUNTER — Other Ambulatory Visit: Payer: Self-pay | Admitting: *Deleted

## 2020-03-12 NOTE — Patient Outreach (Signed)
[facility]) Care Management  03/12/2020  [name] [birth year] [id number]   CSW attempted to make contact with pt's son for updates and was unable but did leave a HIPPA compliant voice message. CSW will await callback from son and/or outreach again . CSW will also inquire with ALF for updates.   [name], MSW, [place] Worker  [place] [phone]

## 2020-03-15 ENCOUNTER — Other Ambulatory Visit: Payer: Self-pay

## 2020-03-15 DIAGNOSIS — F410 Panic disorder [episodic paroxysmal anxiety] without agoraphobia: Secondary | ICD-10-CM | POA: Diagnosis not present

## 2020-03-15 DIAGNOSIS — I959 Hypotension, unspecified: Secondary | ICD-10-CM | POA: Diagnosis not present

## 2020-03-15 DIAGNOSIS — R55 Syncope and collapse: Secondary | ICD-10-CM | POA: Diagnosis not present

## 2020-03-15 DIAGNOSIS — I509 Heart failure, unspecified: Secondary | ICD-10-CM | POA: Diagnosis not present

## 2020-03-15 DIAGNOSIS — G20 Parkinson's disease: Secondary | ICD-10-CM | POA: Diagnosis not present

## 2020-03-15 DIAGNOSIS — Z7409 Other reduced mobility: Secondary | ICD-10-CM | POA: Diagnosis not present

## 2020-03-15 DIAGNOSIS — F0391 Unspecified dementia with behavioral disturbance: Secondary | ICD-10-CM | POA: Diagnosis not present

## 2020-03-15 DIAGNOSIS — E7800 Pure hypercholesterolemia, unspecified: Secondary | ICD-10-CM | POA: Diagnosis not present

## 2020-03-15 DIAGNOSIS — N393 Stress incontinence (female) (male): Secondary | ICD-10-CM | POA: Diagnosis not present

## 2020-03-15 NOTE — Patient Outreach (Signed)
  [facility]) Care Management Chronic Special Needs Program    03/15/2020  Name: [name], DOB: 1955-10-01  MRN: [id number]   [name] is enrolled in a chronic special needs plan for Heart Failure.Telephone call to client's son, [name] for CSNP assessment follow up for client. Unable to reach. HIPAA compliant voice message left with call back phone number and return call request.   PLAN; RNCM will attempt 2nd telephone call to client's son within 2 weeks.   [name] RN,BSN,CCM [facility] Care Management [phone]

## 2020-03-16 ENCOUNTER — Other Ambulatory Visit: Payer: Self-pay | Admitting: *Deleted

## 2020-03-17 NOTE — Patient Outreach (Signed)
[facility]) Care Management  03/17/2020  [name] [birth year] [id number]   CSW spoke with pt's son by phone today who reports pt remains at [facility].  Per son, "I got to find somewhere to put her".  He is uncertain of current ALF plans and reminded to take the [facility] (which he has) to the facilities he is wanting to move her to. Pt's son at work and plans to callback after he gets off later today.   [name], MSW, [place] Worker  [place] [phone]

## 2020-03-23 ENCOUNTER — Other Ambulatory Visit: Payer: Self-pay | Admitting: *Deleted

## 2020-03-26 NOTE — Patient Outreach (Signed)
[facility]) Care Management  03/26/2020  [name] [birth year] [id number]   LATE ENTRY  CSW spoke with pt's son by phone who reports he has not made any progress on his plans to seek options for his mother; to move from ALF to SNF as well as for Medicaid.  Pt's son is encouraged to seek local options for his mother.   CSW also made call to DSS and awaiting callback regarding Medicaid status and process to pursue higher level of care.  CSW will provide updates to son once received.    [name], MSW, [place] Worker  [place] [phone]

## 2020-03-29 NOTE — Patient Outreach (Signed)
[facility]) Care Management  03/29/2020  [name] 1956/02/01 [id number]   LATE ENTRY  CSW spoke with DSS office and awaiting callback regarding pt's status.  CSW also spoke with son who reports he has not been able to seek facility placement options due to the birth of his son.  CSW reminded son of pt's current placement (ALF) not being able to keep pt (due to financial reasons) and the likelihood they will seek placement also elsewhere for pt and may not be as selective in location as he.   CSW will plan to update pt's son again after callback from [place].   [name], MSW, [place] Worker  [place] [phone]

## 2020-03-31 ENCOUNTER — Other Ambulatory Visit: Payer: Self-pay | Admitting: *Deleted

## 2020-03-31 NOTE — Patient Outreach (Signed)
[facility]) Care Management  03/31/2020  [name] [birth year] [id number]   CSW spoke with pt's son by phone today and provided him with contact # for DSS/Medicaid.  CSW advised him to call and see if they can update him on her eligibilty and/or what he needs to do to get her on LTC Medicaid.  CSW advised son that DSS was unable to release info to me.  CSW asked son to call back with updates on this once he has called.   [name], MSW, [place] Worker  [place] [phone]

## 2020-04-06 ENCOUNTER — Ambulatory Visit: Payer: Self-pay | Admitting: *Deleted

## 2020-04-07 ENCOUNTER — Other Ambulatory Visit: Payer: Self-pay | Admitting: *Deleted

## 2020-04-07 NOTE — Patient Outreach (Signed)
[facility]) Care Management  04/07/2020  [name] 06-04-1956 [id number]  CSW attempted to reach pt's son on 04/06/2020 and left a message for return call.  CSW will attempt outreach again in 3-4 business days per protocol if no return call is received.   [name], MSW, [place] Worker  [place] [phone]

## 2020-04-09 ENCOUNTER — Other Ambulatory Visit: Payer: Self-pay | Admitting: *Deleted

## 2020-04-12 DIAGNOSIS — N399 Disorder of urinary system, unspecified: Secondary | ICD-10-CM | POA: Diagnosis not present

## 2020-04-12 DIAGNOSIS — F0391 Unspecified dementia with behavioral disturbance: Secondary | ICD-10-CM | POA: Diagnosis not present

## 2020-04-12 DIAGNOSIS — E7800 Pure hypercholesterolemia, unspecified: Secondary | ICD-10-CM | POA: Diagnosis not present

## 2020-04-12 DIAGNOSIS — G20 Parkinson's disease: Secondary | ICD-10-CM | POA: Diagnosis not present

## 2020-04-12 DIAGNOSIS — I509 Heart failure, unspecified: Secondary | ICD-10-CM | POA: Diagnosis not present

## 2020-04-12 DIAGNOSIS — Z66 Do not resuscitate: Secondary | ICD-10-CM | POA: Diagnosis not present

## 2020-04-12 DIAGNOSIS — Z7409 Other reduced mobility: Secondary | ICD-10-CM | POA: Diagnosis not present

## 2020-04-12 DIAGNOSIS — F410 Panic disorder [episodic paroxysmal anxiety] without agoraphobia: Secondary | ICD-10-CM | POA: Diagnosis not present

## 2020-04-12 NOTE — Patient Outreach (Signed)
[facility]) Care Management  04/12/2020  WILFRED SIVERSON Oct 21, 1955 [id number]  LATE ENTRY  CSW attempted to reach pt's son as well as ALF rep where pt resides on 04/09/2020 for updates but was unable to reach.  CSW will outreach again per policy in 3-4 business days.   [name], MSW, [place] Worker  [place] [phone]

## 2020-04-13 ENCOUNTER — Emergency Department (HOSPITAL_COMMUNITY): Payer: HMO

## 2020-04-13 ENCOUNTER — Inpatient Hospital Stay (HOSPITAL_COMMUNITY)
Admission: EM | Admit: 2020-04-13 | Discharge: 2020-05-28 | DRG: 640 | Disposition: A | Discharge status: Skilled Nursing Facility | Payer: HMO | Source: Skilled Nursing Facility | Attending: Internal Medicine | Admitting: Internal Medicine

## 2020-04-13 ENCOUNTER — Other Ambulatory Visit: Payer: Self-pay

## 2020-04-13 ENCOUNTER — Encounter (HOSPITAL_COMMUNITY): Payer: Self-pay | Admitting: Emergency Medicine

## 2020-04-13 DIAGNOSIS — Z20822 Contact with and (suspected) exposure to covid-19: Secondary | ICD-10-CM | POA: Diagnosis not present

## 2020-04-13 DIAGNOSIS — G9340 Encephalopathy, unspecified: Secondary | ICD-10-CM | POA: Diagnosis not present

## 2020-04-13 DIAGNOSIS — Z7401 Bed confinement status: Secondary | ICD-10-CM

## 2020-04-13 DIAGNOSIS — Z9049 Acquired absence of other specified parts of digestive tract: Secondary | ICD-10-CM

## 2020-04-13 DIAGNOSIS — F0280 Dementia in other diseases classified elsewhere without behavioral disturbance: Secondary | ICD-10-CM | POA: Diagnosis not present

## 2020-04-13 DIAGNOSIS — Z8249 Family history of ischemic heart disease and other diseases of the circulatory system: Secondary | ICD-10-CM

## 2020-04-13 DIAGNOSIS — Z9114 Patient's other noncompliance with medication regimen: Secondary | ICD-10-CM

## 2020-04-13 DIAGNOSIS — R627 Adult failure to thrive: Secondary | ICD-10-CM | POA: Insufficient documentation

## 2020-04-13 DIAGNOSIS — F419 Anxiety disorder, unspecified: Secondary | ICD-10-CM | POA: Diagnosis present

## 2020-04-13 DIAGNOSIS — R296 Repeated falls: Secondary | ICD-10-CM | POA: Diagnosis present

## 2020-04-13 DIAGNOSIS — B9620 Unspecified Escherichia coli [E. coli] as the cause of diseases classified elsewhere: Secondary | ICD-10-CM | POA: Diagnosis present

## 2020-04-13 DIAGNOSIS — Z6821 Body mass index (BMI) 21.0-21.9, adult: Secondary | ICD-10-CM

## 2020-04-13 DIAGNOSIS — H1089 Other conjunctivitis: Secondary | ICD-10-CM | POA: Diagnosis not present

## 2020-04-13 DIAGNOSIS — I10 Essential (primary) hypertension: Secondary | ICD-10-CM | POA: Diagnosis not present

## 2020-04-13 DIAGNOSIS — Z951 Presence of aortocoronary bypass graft: Secondary | ICD-10-CM

## 2020-04-13 DIAGNOSIS — I429 Cardiomyopathy, unspecified: Secondary | ICD-10-CM | POA: Diagnosis not present

## 2020-04-13 DIAGNOSIS — Z952 Presence of prosthetic heart valve: Secondary | ICD-10-CM

## 2020-04-13 DIAGNOSIS — I2510 Atherosclerotic heart disease of native coronary artery without angina pectoris: Secondary | ICD-10-CM | POA: Diagnosis present

## 2020-04-13 DIAGNOSIS — I110 Hypertensive heart disease with heart failure: Secondary | ICD-10-CM | POA: Diagnosis not present

## 2020-04-13 DIAGNOSIS — G9341 Metabolic encephalopathy: Secondary | ICD-10-CM | POA: Diagnosis not present

## 2020-04-13 DIAGNOSIS — E785 Hyperlipidemia, unspecified: Secondary | ICD-10-CM | POA: Diagnosis present

## 2020-04-13 DIAGNOSIS — R5383 Other fatigue: Secondary | ICD-10-CM

## 2020-04-13 DIAGNOSIS — E870 Hyperosmolality and hypernatremia: Secondary | ICD-10-CM | POA: Diagnosis present

## 2020-04-13 DIAGNOSIS — R531 Weakness: Secondary | ICD-10-CM | POA: Diagnosis not present

## 2020-04-13 DIAGNOSIS — E872 Acidosis: Secondary | ICD-10-CM | POA: Diagnosis not present

## 2020-04-13 DIAGNOSIS — I700 Atherosclerosis of aorta: Secondary | ICD-10-CM | POA: Diagnosis not present

## 2020-04-13 DIAGNOSIS — R630 Anorexia: Secondary | ICD-10-CM | POA: Diagnosis not present

## 2020-04-13 DIAGNOSIS — R509 Fever, unspecified: Secondary | ICD-10-CM | POA: Diagnosis not present

## 2020-04-13 DIAGNOSIS — J690 Pneumonitis due to inhalation of food and vomit: Secondary | ICD-10-CM | POA: Diagnosis not present

## 2020-04-13 DIAGNOSIS — I712 Thoracic aortic aneurysm, without rupture: Secondary | ICD-10-CM | POA: Diagnosis present

## 2020-04-13 DIAGNOSIS — F17210 Nicotine dependence, cigarettes, uncomplicated: Secondary | ICD-10-CM | POA: Diagnosis present

## 2020-04-13 DIAGNOSIS — G319 Degenerative disease of nervous system, unspecified: Secondary | ICD-10-CM | POA: Diagnosis not present

## 2020-04-13 DIAGNOSIS — K5900 Constipation, unspecified: Secondary | ICD-10-CM | POA: Diagnosis not present

## 2020-04-13 DIAGNOSIS — G20 Parkinson's disease: Secondary | ICD-10-CM | POA: Diagnosis not present

## 2020-04-13 DIAGNOSIS — I6782 Cerebral ischemia: Secondary | ICD-10-CM | POA: Diagnosis not present

## 2020-04-13 DIAGNOSIS — F329 Major depressive disorder, single episode, unspecified: Secondary | ICD-10-CM | POA: Diagnosis present

## 2020-04-13 DIAGNOSIS — N390 Urinary tract infection, site not specified: Secondary | ICD-10-CM | POA: Diagnosis not present

## 2020-04-13 DIAGNOSIS — E43 Unspecified severe protein-calorie malnutrition: Secondary | ICD-10-CM | POA: Diagnosis present

## 2020-04-13 DIAGNOSIS — M2550 Pain in unspecified joint: Secondary | ICD-10-CM | POA: Diagnosis not present

## 2020-04-13 DIAGNOSIS — J90 Pleural effusion, not elsewhere classified: Secondary | ICD-10-CM | POA: Diagnosis not present

## 2020-04-13 DIAGNOSIS — Z96642 Presence of left artificial hip joint: Secondary | ICD-10-CM | POA: Diagnosis present

## 2020-04-13 DIAGNOSIS — Y636 Underdosing and nonadministration of necessary drug, medicament or biological substance: Secondary | ICD-10-CM | POA: Diagnosis present

## 2020-04-13 DIAGNOSIS — E440 Moderate protein-calorie malnutrition: Secondary | ICD-10-CM | POA: Insufficient documentation

## 2020-04-13 DIAGNOSIS — I672 Cerebral atherosclerosis: Secondary | ICD-10-CM | POA: Diagnosis not present

## 2020-04-13 DIAGNOSIS — I5032 Chronic diastolic (congestive) heart failure: Secondary | ICD-10-CM | POA: Diagnosis present

## 2020-04-13 DIAGNOSIS — Z95810 Presence of automatic (implantable) cardiac defibrillator: Secondary | ICD-10-CM

## 2020-04-13 DIAGNOSIS — B370 Candidal stomatitis: Secondary | ICD-10-CM | POA: Diagnosis not present

## 2020-04-13 DIAGNOSIS — Z79899 Other long term (current) drug therapy: Secondary | ICD-10-CM

## 2020-04-13 DIAGNOSIS — R0902 Hypoxemia: Secondary | ICD-10-CM | POA: Diagnosis not present

## 2020-04-13 DIAGNOSIS — R404 Transient alteration of awareness: Secondary | ICD-10-CM | POA: Diagnosis not present

## 2020-04-13 DIAGNOSIS — Z86010 Personal history of colonic polyps: Secondary | ICD-10-CM

## 2020-04-13 DIAGNOSIS — E861 Hypovolemia: Secondary | ICD-10-CM | POA: Diagnosis not present

## 2020-04-13 DIAGNOSIS — E860 Dehydration: Secondary | ICD-10-CM | POA: Diagnosis not present

## 2020-04-13 DIAGNOSIS — R410 Disorientation, unspecified: Secondary | ICD-10-CM | POA: Diagnosis not present

## 2020-04-13 DIAGNOSIS — J432 Centrilobular emphysema: Secondary | ICD-10-CM | POA: Diagnosis not present

## 2020-04-13 DIAGNOSIS — R54 Age-related physical debility: Secondary | ICD-10-CM | POA: Diagnosis present

## 2020-04-13 DIAGNOSIS — Z7982 Long term (current) use of aspirin: Secondary | ICD-10-CM

## 2020-04-13 DIAGNOSIS — I6523 Occlusion and stenosis of bilateral carotid arteries: Secondary | ICD-10-CM | POA: Diagnosis not present

## 2020-04-13 DIAGNOSIS — E210 Primary hyperparathyroidism: Secondary | ICD-10-CM | POA: Diagnosis present

## 2020-04-13 LAB — COMPREHENSIVE METABOLIC PANEL
ALT: 30 U/L (ref 0–44)
AST: 33 U/L (ref 15–41)
Albumin: 3.8 g/dL (ref 3.5–5.0)
Alkaline Phosphatase: 99 U/L (ref 38–126)
Anion gap: 13 (ref 5–15)
BUN: 36 mg/dL — ABNORMAL HIGH (ref 8–23)
CO2: 24 mmol/L (ref 22–32)
Calcium: 11.2 mg/dL — ABNORMAL HIGH (ref 8.9–10.3)
Chloride: 117 mmol/L — ABNORMAL HIGH (ref 98–111)
Creatinine, Ser: 1.24 mg/dL — ABNORMAL HIGH (ref 0.44–1.00)
GFR calc Af Amer: 52 mL/min — ABNORMAL LOW (ref >60)
GFR calc non Af Amer: 45 mL/min — ABNORMAL LOW (ref >60)
Glucose, Bld: 109 mg/dL — ABNORMAL HIGH (ref 70–99)
Potassium: 3.7 mmol/L (ref 3.5–5.1)
Sodium: 154 mmol/L — ABNORMAL HIGH (ref 135–145)
Total Bilirubin: 1 mg/dL (ref 0.3–1.2)
Total Protein: 8.8 g/dL — ABNORMAL HIGH (ref 6.5–8.1)

## 2020-04-13 LAB — AMMONIA: Ammonia: 36 umol/L — ABNORMAL HIGH (ref 9–35)

## 2020-04-13 LAB — CBC
HCT: 49.2 % — ABNORMAL HIGH (ref 36.0–46.0)
Hemoglobin: 14 g/dL (ref 12.0–15.0)
MCH: 25.8 pg — ABNORMAL LOW (ref 26.0–34.0)
MCHC: 28.5 g/dL — ABNORMAL LOW (ref 30.0–36.0)
MCV: 90.6 fL (ref 80.0–100.0)
Platelets: 140 10*3/uL — ABNORMAL LOW (ref 150–400)
RBC: 5.43 MIL/uL — ABNORMAL HIGH (ref 3.87–5.11)
RDW: 20.5 % — ABNORMAL HIGH (ref 11.5–15.5)
WBC: 5.8 10*3/uL (ref 4.0–10.5)
nRBC: 0 % (ref 0.0–0.2)

## 2020-04-13 LAB — SARS CORONAVIRUS 2 BY RT PCR (HOSPITAL ORDER, PERFORMED IN ~~LOC~~ HOSPITAL LAB): SARS Coronavirus 2: NEGATIVE

## 2020-04-13 LAB — CBG MONITORING, ED: Glucose-Capillary: 86 mg/dL (ref 70–99)

## 2020-04-13 LAB — VALPROIC ACID LEVEL: Valproic Acid Lvl: <10 ug/mL — ABNORMAL LOW (ref 50.0–100.0)

## 2020-04-13 MED ORDER — ROSUVASTATIN CALCIUM 5 MG PO TABS
10.0000 mg | ORAL_TABLET | Freq: Every day | ORAL | Status: DC
  Administered 2020-04-14 – 2020-05-28 (×44): 10 mg via ORAL
  Filled 2020-04-13 (×45): qty 2

## 2020-04-13 MED ORDER — BACITRACIN-NEOMYCIN-POLYMYXIN OINTMENT TUBE
1.0000 "application " | TOPICAL_OINTMENT | CUTANEOUS | Status: DC | PRN
  Filled 2020-04-13: qty 14.17

## 2020-04-13 MED ORDER — ALUM & MAG HYDROXIDE-SIMETH 200-200-20 MG/5ML PO SUSP: 30.0000 mL | Freq: Four times a day (QID) | ORAL | Status: DC | PRN

## 2020-04-13 MED ORDER — LORAZEPAM 0.5 MG PO TABS
0.5000 mg | ORAL_TABLET | Freq: Every evening | ORAL | Status: DC
  Administered 2020-04-13 – 2020-05-01 (×17): 0.5 mg via ORAL
  Filled 2020-04-13 (×17): qty 1

## 2020-04-13 MED ORDER — DIVALPROEX SODIUM 125 MG PO CSDR
125.0000 mg | DELAYED_RELEASE_CAPSULE | Freq: Every day | ORAL | Status: DC
  Administered 2020-04-13 – 2020-05-28 (×45): 125 mg via ORAL
  Filled 2020-04-13 (×47): qty 1

## 2020-04-13 MED ORDER — LABETALOL HCL 5 MG/ML IV SOLN: 10.0000 mg | Freq: Once | INTRAVENOUS | Status: DC

## 2020-04-13 MED ORDER — DEXTROSE 5 % IV SOLN: INTRAVENOUS | Status: AC

## 2020-04-13 MED ORDER — ADULT MULTIVITAMIN W/MINERALS CH
1.0000 | ORAL_TABLET | Freq: Every day | ORAL | Status: DC
  Administered 2020-04-14 – 2020-05-28 (×45): 1 via ORAL
  Filled 2020-04-13 (×44): qty 1

## 2020-04-13 MED ORDER — LOPERAMIDE HCL 2 MG PO CAPS
2.0000 mg | ORAL_CAPSULE | ORAL | Status: DC | PRN
  Administered 2020-04-25: 2 mg via ORAL
  Filled 2020-04-13: qty 1

## 2020-04-13 MED ORDER — IRBESARTAN 75 MG PO TABS
75.0000 mg | ORAL_TABLET | Freq: Every day | ORAL | Status: DC
  Administered 2020-04-14 – 2020-05-18 (×35): 75 mg via ORAL
  Filled 2020-04-13 (×36): qty 1

## 2020-04-13 MED ORDER — SODIUM CHLORIDE 0.9% FLUSH: 3.0000 mL | Freq: Once | INTRAVENOUS | Status: DC

## 2020-04-13 MED ORDER — POLYETHYLENE GLYCOL 3350 17 G PO PACK
17.0000 g | PACK | Freq: Every day | ORAL | Status: DC
  Administered 2020-04-14 – 2020-05-28 (×43): 17 g via ORAL
  Filled 2020-04-13 (×43): qty 1

## 2020-04-13 MED ORDER — LABETALOL HCL 5 MG/ML IV SOLN: 20.0000 mg | Freq: Once | INTRAVENOUS | Status: DC

## 2020-04-13 MED ORDER — ASPIRIN 325 MG PO TABS
325.0000 mg | ORAL_TABLET | Freq: Every day | ORAL | Status: DC
  Administered 2020-04-13 – 2020-05-28 (×46): 325 mg via ORAL
  Filled 2020-04-13 (×45): qty 1

## 2020-04-13 MED ORDER — SERTRALINE HCL 100 MG PO TABS
100.0000 mg | ORAL_TABLET | Freq: Every day | ORAL | Status: DC
  Administered 2020-04-13 – 2020-05-27 (×45): 100 mg via ORAL
  Filled 2020-04-13 (×47): qty 1

## 2020-04-13 MED ORDER — POLYVINYL ALCOHOL 1.4 % OP SOLN
Freq: Every day | OPHTHALMIC | Status: DC | PRN
  Filled 2020-04-13: qty 15

## 2020-04-13 MED ORDER — HYDRALAZINE HCL 20 MG/ML IJ SOLN: 5.0000 mg | INTRAMUSCULAR | Status: DC | PRN

## 2020-04-13 MED ORDER — GUAIFENESIN 100 MG/5ML PO SYRP
200.0000 mg | ORAL_SOLUTION | Freq: Four times a day (QID) | ORAL | Status: DC | PRN
  Filled 2020-04-13: qty 10

## 2020-04-13 MED ORDER — ENOXAPARIN SODIUM 40 MG/0.4ML ~~LOC~~ SOLN
40.0000 mg | SUBCUTANEOUS | Status: DC
  Administered 2020-04-13: 40 mg via SUBCUTANEOUS
  Filled 2020-04-13: qty 0.4

## 2020-04-13 MED ORDER — CARBIDOPA-LEVODOPA 25-100 MG PO TABS: 1.0000 | ORAL_TABLET | Freq: Three times a day (TID) | ORAL | Status: DC

## 2020-04-13 MED ORDER — GUAIFENESIN 100 MG/5ML PO SOLN
200.0000 mg | Freq: Four times a day (QID) | ORAL | Status: DC | PRN
  Filled 2020-04-13: qty 10

## 2020-04-13 MED ORDER — SODIUM CHLORIDE 0.9 % IV BOLUS
1000.0000 mL | Freq: Once | INTRAVENOUS | Status: AC
  Administered 2020-04-13: 1000 mL via INTRAVENOUS

## 2020-04-13 MED ORDER — CARBIDOPA-LEVODOPA 25-100 MG PO TABS
1.0000 | ORAL_TABLET | Freq: Three times a day (TID) | ORAL | Status: DC
  Administered 2020-04-13 – 2020-05-28 (×136): 1 via ORAL
  Filled 2020-04-13 (×135): qty 1

## 2020-04-13 MED ORDER — DONEPEZIL HCL 10 MG PO TABS
10.0000 mg | ORAL_TABLET | Freq: Every day | ORAL | Status: DC
  Administered 2020-04-13 – 2020-05-28 (×46): 10 mg via ORAL
  Filled 2020-04-13 (×18): qty 1
  Filled 2020-04-13: qty 2
  Filled 2020-04-13 (×27): qty 1

## 2020-04-13 MED ORDER — LABETALOL HCL 5 MG/ML IV SOLN: 20.0000 mg | INTRAVENOUS | Status: DC | PRN

## 2020-04-13 MED ORDER — CARVEDILOL 12.5 MG PO TABS
12.5000 mg | ORAL_TABLET | Freq: Two times a day (BID) | ORAL | Status: DC
  Administered 2020-04-14 – 2020-05-28 (×90): 12.5 mg via ORAL
  Filled 2020-04-13 (×86): qty 1

## 2020-04-13 NOTE — ED Notes (Signed)
Purewick placed on pt. 

## 2020-04-13 NOTE — ED Notes (Addendum)
Systolic BP 391 via doppler. Admitting MD at bedside and aware.

## 2020-04-13 NOTE — ED Provider Notes (Signed)
[facility] EMERGENCY DEPARTMENT Provider Note   CSN: [id number] Arrival date & time: 04/13/20  1353     History Chief Complaint  Patient presents with  . Fatigue  . Failure To Thrive  . Hypertension    [name] is a 64 y.o. female.  Level 5 caveat secondary to altered mental status.  Patient is a resident at [facility].  Per EMS increased confusion not eating or drinking for the last 3 days.  Has not taken her medicines for a day.  Patient is with her head slumped over in bed.  She will converse.  And denies any pain.  History of Parkinson's disease and dementia.  Not on blood thinners.  No reported fevers.  The history is provided by the patient and the EMS personnel.  Altered Mental Status Presenting symptoms: lethargy   Severity:  Moderate Most recent episode:  2 days ago Episode history:  Continuous Progression:  Unchanged Chronicity:  New Context: dementia, not taking medications as prescribed and nursing home resident   Context: not head injury   Associated symptoms: no abdominal pain, no fever, no headaches, no nausea and no vomiting        Past Medical History:  Diagnosis Date  . Abnormal liver function tests   . AICD (automatic cardioverter/defibrillator) present    [employer]  . Anxiety   . Aortic stenosis   . Benign neoplasm of colon 04/19/2001   Hyperplastic  . Bicuspid aortic valve   . Cardiomyopathy secondary   . CHF (congestive heart failure) ([place])   . Coronary artery disease   . Depression   . Headache   . Heart murmur   . HTN (hypertension)   . Hypercalcemia   . Hyperlipidemia   . Memory changes 06/2018   in the last year /per the brother  . S/P ICD (internal cardiac defibrillator) procedure 2018    Patient Active Problem List   Diagnosis Date Noted  . Closed left hip fracture, initial encounter ([place]) 12/30/2019  . Frequent falls   . Orthostatic hypotension 12/23/2019  . Syncopal episodes  12/22/2019  . Dementia due to Parkinson's disease without behavioral disturbance ([place]) 12/22/2019  . Renal insufficiency 12/22/2019  . Near syncope 09/04/2019  . AKI (acute kidney injury) ([place]) 09/04/2019  . Hypokalemia 09/04/2019  . Depression 09/04/2019  . Dementia ([place]) 09/04/2019  . Thrombocytopenia ([place]) 09/04/2019  . Low back pain 06/25/2019  . S/P AVR (aortic valve replacement) 12/12/2016  . Aortic stenosis 11/16/2016  . Severe aortic stenosis   . Polycythemia 06/06/2016  . Menopausal state 02/16/2016  . Hyperparathyroidism, primary ([place]) 02/16/2016  . Chronic diastolic CHF (congestive heart failure) ([place]) 05/07/2014  . Hx of colonic polyps 06/17/2013  . Ventricular tachycardia ([place]) 02/21/2011  . Automatic implantable cardioverter-defibrillator in situ 06/02/2009  . Hyperlipidemia 05/29/2009  . SMOKER 05/29/2009  . Essential hypertension 05/29/2009  . Aortic valve disorder 05/29/2009  . CARDIOMYOPATHY, SECONDARY 05/29/2009  . CHF 05/29/2009  . BICUSPID AORTIC VALVE 05/29/2009  . LIVER FUNCTION TESTS, ABNORMAL, HX OF 05/29/2009  . CHOLECYSTITIS, HX OF 05/29/2009    Past Surgical History:  Procedure Laterality Date  . AORTIC VALVE REPLACEMENT N/A 11/16/2016   Procedure: AORTIC VALVE REPLACEMENT (AVR);  Surgeon: Gaye Pollack, MD;  Location: [facility];  Service: Open Heart Surgery;  Laterality: N/A;  54mm Perimount Magna Ease Pericardial Bioprosthesis  . BREAST EXCISIONAL BIOPSY Left 1984   benign  . CARDIAC CATHETERIZATION N/A 07/05/2016   Procedure:  Right/Left Heart Cath and Coronary Angiography;  Surgeon: Burnell Blanks, MD;  Location: [facility];  Service: Cardiovascular;  Laterality: N/A;  . CHOLECYSTECTOMY    . implantation of a Guidant biventricular ICD    . laparoscopic cholecystectomy with intraoperative cholangiogram  2003  . TEE WITHOUT CARDIOVERSION N/A 11/16/2016   Procedure: TRANSESOPHAGEAL ECHOCARDIOGRAM (TEE);  Surgeon: Gaye Pollack, MD;   Location: [facility];  Service: Open Heart Surgery;  Laterality: N/A;  . TOTAL HIP ARTHROPLASTY Left 12/31/2019   Procedure: TOTAL HIP ARTHROPLASTY ANTERIOR APPROACH;  Surgeon: Rod Can, MD;  Location: [facility];  Service: Orthopedics;  Laterality: Left;     OB History   No obstetric history on file.     Family History  Problem Relation Age of Onset  . Pancreatic cancer Mother   . Colon cancer Maternal Aunt   . Bone cancer Brother   . Heart attack Brother   . Hypercalcemia Neg Hx     Social History   Tobacco Use  . Smoking status: Current Every Day Smoker    Packs/day: 0.25    Years: 40.00    Pack years: 10.00    Types: Cigarettes  . Smokeless tobacco: Never Used  . Tobacco comment: client reports she smokes 1 cigarette per day  Vaping Use  . Vaping Use: Never used  Substance Use Topics  . Alcohol use: Yes    Alcohol/week: 0.0 standard drinks    Comment: occasional  . Drug use: No    Home Medications Prior to Admission medications   Medication Sig Start Date End Date Taking? Authorizing Provider  alum & mag hydroxide-simeth (MAALOX/MYLANTA) 200-200-20 MG/5ML suspension Take 30 mLs by mouth every 6 (six) hours as needed for indigestion or heartburn.    Yes [provider]  aspirin 325 MG tablet Take 325 mg by mouth daily.   Yes [provider]  carbidopa-levodopa (SINEMET IR) 25-100 MG tablet Take 1 tablet by mouth 3 (three) times daily. 01/02/20 03/09/29 Yes Arrien, Jimmy Picket, MD  carvedilol (COREG) 12.5 MG tablet Take 12.5 mg by mouth 2 (two) times a day. With food 12/16/18  Yes [provider]  divalproex (DEPAKOTE SPRINKLE) 125 MG capsule Take 125 mg by mouth daily.   Yes [provider]  donepezil (ARICEPT) 10 MG tablet Take 1 tablet (10 mg total) by mouth daily. 01/02/20 03/09/29 Yes Arrien, Jimmy Picket, MD  furosemide (LASIX) 20 MG tablet Take 20 mg by mouth daily.  01/08/19  Yes [provider]  irbesartan (AVAPRO) 75  MG tablet Take 75 mg by mouth daily.   Yes [provider]  loperamide (IMODIUM) 2 MG capsule Take 2 mg by mouth as needed (with each loose stool/diarrhea- not to exceed 8 doses in 24 hours).    Yes [provider]  LORazepam (ATIVAN) 0.5 MG tablet Take 0.5 mg by mouth every evening.    Yes [provider]  magnesium hydroxide (MILK OF MAGNESIA) 400 MG/5ML suspension Take 30 mLs by mouth at bedtime as needed for mild constipation.   Yes [provider]  Multiple Vitamin (MULTIVITAMIN WITH MINERALS) TABS tablet Take 1 tablet by mouth daily.    Yes [provider]  NON FORMULARY Take 120 mLs by mouth See admin instructions. Mighty Shakes- Drink 120 ml's by mouth two times a day   Yes [provider]  olmesartan (BENICAR) 20 MG tablet Take 20 mg by mouth daily. 10/04/19  Yes [provider]  polyethylene glycol (  MIRALAX / GLYCOLAX) 17 g packet Take 17 g by mouth daily.   Yes [provider]  acetaminophen (TYLENOL) 325 MG tablet Take 2 tablets (650 mg total) by mouth 3 (three) times daily. 01/02/20   Arrien, Jimmy Picket, MD  guaifenesin (ROBITUSSIN) 100 MG/5ML syrup Take 200 mg by mouth 4 (four) times daily as needed for cough.    [provider]  Neomycin-Bacitracin-Polymyxin (TRIPLE ANTIBIOTIC) [phone] OINT Apply 1 application topically daily as needed (for minor skin tears or abrasions).    [provider]  Nutritional Supplements (NUTRITIONAL DRINK PO) Take 4 oz by mouth 2 (two) times daily. Mighty Shakes    [provider]  oxyCODONE (OXY IR/ROXICODONE) 5 MG immediate release tablet Take 0.5 tablets (2.5 mg total) by mouth every 4 (four) hours as needed for moderate pain. Patient not taking: Reported on 03/02/2020 01/02/20   Arrien, Jimmy Picket, MD  Polyethyl Glycol-Propyl Glycol (SYSTANE OP) Place 1 drop into both eyes daily as needed (for dry eyes.).     [provider]  rosuvastatin  (CRESTOR) 10 MG tablet Take 1 tablet (10 mg total) by mouth daily. 05/01/11   Evans Lance, MD  sertraline (ZOLOFT) 100 MG tablet Take 1 tablet (100 mg total) by mouth daily. 02/28/19   Cameron Sprang, MD    Allergies    Trospium chloride er  Review of Systems   Review of Systems  Unable to perform ROS: Mental status change  Constitutional: Negative for fever.  Gastrointestinal: Negative for abdominal pain, nausea and vomiting.  Genitourinary: Negative for dysuria.  Skin: Negative for wound.  Neurological: Negative for headaches.    Physical Exam Updated Vital Signs BP (!) 183/144 (BP Location: Right Arm)   Pulse 96   Temp 98.2 F (36.8 C) (Oral)   Resp 16   Ht 5' (1.524 m)   Wt 60 kg   SpO2 95%   BMI 25.83 kg/m   Physical Exam Vitals and nursing note reviewed.  Constitutional:      General: She is not in acute distress.    Appearance: She is well-developed.  HENT:     Head: Normocephalic and atraumatic.  Eyes:     Conjunctiva/sclera: Conjunctivae normal.  Cardiovascular:     Rate and Rhythm: Normal rate and regular rhythm.     Heart sounds: No murmur heard.   Pulmonary:     Effort: Pulmonary effort is normal. No respiratory distress.     Breath sounds: Normal breath sounds.  Abdominal:     Palpations: Abdomen is soft.     Tenderness: There is no abdominal tenderness.  Musculoskeletal:        General: No deformity or signs of injury. Normal range of motion.     Cervical back: Neck supple.  Skin:    General: Skin is warm and dry.     Capillary Refill: Capillary refill takes less than 2 seconds.  Neurological:     General: No focal deficit present.     Comments: Patient is awake and conversant.  She will follow commands and is moving all extremities.  No obvious facial asymmetry or slurred speech.  Not opening eyes.  Appears generally weak.  Oriented to place and situation.     ED Results / Procedures / Treatments   Labs (all labs ordered are listed, but  only abnormal results are displayed) Labs Reviewed  COMPREHENSIVE METABOLIC PANEL - Abnormal; Notable for the following components:      Result Value  Sodium 154 (*)    Chloride 117 (*)    Glucose, Bld 109 (*)    BUN 36 (*)    Creatinine, Ser 1.24 (*)    Calcium 11.2 (*)    Total Protein 8.8 (*)    GFR calc non Af Amer 45 (*)    GFR calc Af Amer 52 (*)    All other components within normal limits  CBC - Abnormal; Notable for the following components:   RBC 5.43 (*)    HCT 49.2 (*)    MCH 25.8 (*)    MCHC 28.5 (*)    RDW 20.5 (*)    Platelets 140 (*)    All other components within normal limits  VALPROIC ACID LEVEL - Abnormal; Notable for the following components:   Valproic Acid Lvl <10 (*)    All other components within normal limits  AMMONIA - Abnormal; Notable for the following components:   Ammonia 36 (*)    All other components within normal limits  BASIC METABOLIC PANEL - Abnormal; Notable for the following components:   Sodium 151 (*)    Chloride 119 (*)    Glucose, Bld 130 (*)    BUN 31 (*)    Calcium 10.4 (*)    All other components within normal limits  CBC - Abnormal; Notable for the following components:   MCHC 29.5 (*)    RDW 20.2 (*)    Platelets 107 (*)    All other components within normal limits  SARS CORONAVIRUS 2 BY RT PCR (HOSPITAL ORDER, [facility])  URINE CULTURE  TSH  URINALYSIS, ROUTINE W REFLEX MICROSCOPIC  CBG MONITORING, ED  CBG MONITORING, ED    EKG EKG Interpretation  Date/Time:  Tuesday April 13 2020 14:11:09 EDT Ventricular Rate:  97 PR Interval:    QRS Duration: 122 QT Interval:  407 QTC Calculation: 517 R Axis:   0 Text Interpretation: Sinus arrhythmia Ventricular premature complex Anterior infarct, old Prolonged QT interval Confirmed by Aletta Edouard [phone]) on 04/13/2020 2:48:59 PM   Radiology CT Head Wo Contrast  Result Date: 04/13/2020 CLINICAL DATA:  Encephalopathy. Additional provided:  Hypertension, failure to thrive, fatigue. Osteoarthritis, cervical. EXAM: CT HEAD WITHOUT CONTRAST CT CERVICAL SPINE WITHOUT CONTRAST TECHNIQUE: Multidetector CT imaging of the head and cervical spine was performed following the standard protocol without intravenous contrast. Multiplanar CT image reconstructions of the cervical spine were also generated. COMPARISON:  Head CT 02/23/2020, CT cervical spine 12/29/2019. FINDINGS: CT HEAD FINDINGS Brain: Stable, moderate generalized parenchymal atrophy which is advanced for age. Redemonstrated small chronic cortical/subcortical infarct within the right parietal lobe (series 4, image 23) Stable, moderate ill-defined hypoattenuation within the cerebral white matter which is nonspecific, but consistent with chronic small vessel ischemic disease redemonstrated chronic lacunar infarct within the left caudate head. There is no acute intracranial hemorrhage. No acute demarcated cortical infarct is identified. No extra-axial fluid collection. No evidence of intracranial mass. No midline shift. Vascular: No hyperdense vessel.  Atherosclerotic calcifications Skull: Normal. Negative for fracture or focal lesion. Sinuses/Orbits: Visualized orbits show no acute finding. Mild ethmoid sinus mucosal thickening. Left mastoid effusion. CT CERVICAL SPINE FINDINGS Alignment: Cervical dextrocurvature. Straightening of the expected cervical lordosis. Trace C6-C7 and C7-T1 grade 1 anterolisthesis. Skull base and vertebrae: The basion-dental and atlanto-dental intervals are maintained.No evidence of acute fracture to the cervical spine. Soft tissues and spinal canal: No prevertebral fluid or swelling. No visible canal hematoma. Disc levels: Mild for age cervical spondylosis.  There is mild-to-moderate multilevel disc space narrowing. Small C2-C3 right center disc protrusion. Shallow C3-C4 disc bulge. Shallow C4-C5 disc bulge. Shallow C5-C6 disc bulge. Shallow C6-C7 disc bulge. No more than mild  spinal canal stenosis at any level. Multilevel uncovertebral hypertrophy. Minimal multilevel facet arthrosis. Mild/moderate C3-C4 right neural foraminal narrowing. No more than mild bony neural foraminal narrowing at the remaining levels. Upper chest: No consolidation within the imaged lung apices. No visible pneumothorax. Other: Calcified atherosclerotic plaque within the visualized proximal major branch vessels of the neck and within the carotid arteries. IMPRESSION: CT head: 1. No evidence of acute intracranial abnormality. 2. Redemonstrated small chronic cortical/subcortical right parietal lobe infarct. Stable moderate generalized parenchymal atrophy and chronic small vessel ischemic disease. Redemonstrated chronic left basal ganglia lacunar infarct. 3. Mild ethmoid sinus mucosal thickening. 4. Left mastoid effusion CT cervical spine: 1. No evidence of acute fracture to the cervical spine 2. Cervical dextrocurvature. 3. Minimal C6-C7 and C7-T1 grade 1 anterolisthesis. 4. Cervical spondylosis as outlined. No more than mild appreciable spinal canal stenosis at any level. Multilevel neural foraminal narrowing, greatest on the right at C3-C4 (mild/moderate in severity at this site). Electronically Signed   By: Kellie Simmering DO   On: 04/13/2020 17:41   CT Cervical Spine Wo Contrast  Result Date: 04/13/2020 CLINICAL DATA:  Encephalopathy. Additional provided: Hypertension, failure to thrive, fatigue. Osteoarthritis, cervical. EXAM: CT HEAD WITHOUT CONTRAST CT CERVICAL SPINE WITHOUT CONTRAST TECHNIQUE: Multidetector CT imaging of the head and cervical spine was performed following the standard protocol without intravenous contrast. Multiplanar CT image reconstructions of the cervical spine were also generated. COMPARISON:  Head CT 02/23/2020, CT cervical spine 12/29/2019. FINDINGS: CT HEAD FINDINGS Brain: Stable, moderate generalized parenchymal atrophy which is advanced for age. Redemonstrated small chronic  cortical/subcortical infarct within the right parietal lobe (series 4, image 23) Stable, moderate ill-defined hypoattenuation within the cerebral white matter which is nonspecific, but consistent with chronic small vessel ischemic disease redemonstrated chronic lacunar infarct within the left caudate head. There is no acute intracranial hemorrhage. No acute demarcated cortical infarct is identified. No extra-axial fluid collection. No evidence of intracranial mass. No midline shift. Vascular: No hyperdense vessel.  Atherosclerotic calcifications Skull: Normal. Negative for fracture or focal lesion. Sinuses/Orbits: Visualized orbits show no acute finding. Mild ethmoid sinus mucosal thickening. Left mastoid effusion. CT CERVICAL SPINE FINDINGS Alignment: Cervical dextrocurvature. Straightening of the expected cervical lordosis. Trace C6-C7 and C7-T1 grade 1 anterolisthesis. Skull base and vertebrae: The basion-dental and atlanto-dental intervals are maintained.No evidence of acute fracture to the cervical spine. Soft tissues and spinal canal: No prevertebral fluid or swelling. No visible canal hematoma. Disc levels: Mild for age cervical spondylosis. There is mild-to-moderate multilevel disc space narrowing. Small C2-C3 right center disc protrusion. Shallow C3-C4 disc bulge. Shallow C4-C5 disc bulge. Shallow C5-C6 disc bulge. Shallow C6-C7 disc bulge. No more than mild spinal canal stenosis at any level. Multilevel uncovertebral hypertrophy. Minimal multilevel facet arthrosis. Mild/moderate C3-C4 right neural foraminal narrowing. No more than mild bony neural foraminal narrowing at the remaining levels. Upper chest: No consolidation within the imaged lung apices. No visible pneumothorax. Other: Calcified atherosclerotic plaque within the visualized proximal major branch vessels of the neck and within the carotid arteries. IMPRESSION: CT head: 1. No evidence of acute intracranial abnormality. 2. Redemonstrated small  chronic cortical/subcortical right parietal lobe infarct. Stable moderate generalized parenchymal atrophy and chronic small vessel ischemic disease. Redemonstrated chronic left basal ganglia lacunar infarct. 3. Mild ethmoid sinus mucosal thickening. 4.  Left mastoid effusion CT cervical spine: 1. No evidence of acute fracture to the cervical spine 2. Cervical dextrocurvature. 3. Minimal C6-C7 and C7-T1 grade 1 anterolisthesis. 4. Cervical spondylosis as outlined. No more than mild appreciable spinal canal stenosis at any level. Multilevel neural foraminal narrowing, greatest on the right at C3-C4 (mild/moderate in severity at this site). Electronically Signed   By: Kellie Simmering DO   On: 04/13/2020 17:41   DG Chest Port 1 View  Result Date: 04/13/2020 CLINICAL DATA:  Weakness failure to thrive EXAM: PORTABLE CHEST 1 VIEW COMPARISON:  12/29/2019 FINDINGS: Post sternotomy changes and valve prosthesis. Left-sided pacing device as before. No focal airspace disease or effusion. Normal cardiomediastinal silhouette with aortic atherosclerosis. No pneumothorax. IMPRESSION: No active disease. Electronically Signed   By: Donavan Foil M.D.   On: 04/13/2020 15:45    Procedures Procedures (including critical care time)  Medications Ordered in ED Medications  sodium chloride flush (NS) 0.9 % injection 3 mL (has no administration in time range)  sodium chloride 0.9 % bolus 1,000 mL (has no administration in time range)  carbidopa-levodopa (SINEMET IR) 25-100 MG per tablet immediate release 1 tablet (has no administration in time range)    ED Course  I have reviewed the triage vital signs and the nursing notes.  Pertinent labs & imaging results that were available during my care of the patient were reviewed by me and considered in my medical decision making (see chart for details).  Clinical Course as of Apr 14 1056  Tue Apr 13, 2020  1530 Chest x-ray with pacer AICD no gross infiltrates no pneumothorax  interpreted by me.   [MB]  101 [name] is here now.  He states her son is the power of attorney.  He saw her the few days ago and noticed that she had her head slumped down like she is now.  He said she has not walked since she broke her hip in the spring.  During that time she was having multiple falls.  I believe that is when she ended up in the facility.   [MB]  0454 Discussed with [facility] hospitalist Dr. Delice Lesch who will evaluate the patient for admission.   [MB]    Clinical Course User Index [MB] Hayden Rasmussen, MD   MDM Rules/Calculators/A&P                         This patient complains of generalized weakness poor p.o. intake; this involves an extensive number of treatment Options and is a complaint that carries with it a high risk of complications and Morbidity. The differential includes dehydration, infection, stroke, metabolic derangement, medication withdrawal  I ordered, reviewed and interpreted labs, which included CBC with normal white count normal hemoglobin, chemistry with significantly elevated sodium and slight elevation of BUN and creatinine reflecting dehydration, ammonia slightly elevated, Depakote level undetectable I ordered medication IV fluids and her oral Sinemet I ordered imaging studies which included chest x-ray, CT head and cervical spine and I independently    visualized and interpreted imaging which showed no acute findings Additional history obtained from patient's brother and EMS Previous records obtained and reviewed in epic, frequent visits for falls I consulted [facility] hospitalist Dr. Roosevelt Locks and discussed lab and imaging findings  Critical Interventions: None  After the interventions stated above, I reevaluated the patient and found patient to be in no distress.  She will need to be admitted for IV hydration and resumption  of her oral medication.  Final Clinical Impression(s) / ED Diagnoses Final diagnoses:  Dehydration  Hypernatremia    Failure to thrive in adult    Rx / DC Orders ED Discharge Orders    None       Hayden Rasmussen, MD 04/14/20 1102

## 2020-04-13 NOTE — H&P (Addendum)
History and Physical    [name]:[id number] DOB: 1956-06-22 DOA: 04/13/2020  PCP: Lujean Amel, MD (Confirm with patient/family/NH records and if not entered, this has to be entered at [facility] point of entry) Patient coming from: Assisted living  I have personally briefly reviewed patient's old medical records in [place]  Chief Complaint: Change in mental status, not eating and drinking  HPI: [name] is a 64 y.o. female with medical history significant of advanced dementia, Parkinson disease, hypertension, hypercalcemia, chronic diastolic CHF, presented with worsening confusion and not eating or drinking for 3 days.  Try to reach assisted living facility not available, discussed with patient's son over the phone who provides some of the information.  Also reported the patient probably stopped taking her medications for the last 3 days as well.  Patient was awake time I talked to her at bedside, denied any pain, nauseous vomit no diarrhea.  She denied any feeling of depression, but reports she just does not want to eat or drink not feeling hungry or thirsty. ED Course: Severely hydrated, sodium 154, chloride 117, calcium 11.2.  X-ray showing chronic stroke no structural changes or bleeding.  Review of Systems: Unable to perform, pt confused.   Past Medical History:  Diagnosis Date  . Abnormal liver function tests   . AICD (automatic cardioverter/defibrillator) present    [employer]  . Anxiety   . Aortic stenosis   . Benign neoplasm of colon 04/19/2001   Hyperplastic  . Bicuspid aortic valve   . Cardiomyopathy secondary   . CHF (congestive heart failure) ([place])   . Coronary artery disease   . Depression   . Headache   . Heart murmur   . HTN (hypertension)   . Hypercalcemia   . Hyperlipidemia   . Memory changes 06/2018   in the last year /per the brother  . S/P ICD (internal cardiac defibrillator) procedure 2018    Past Surgical History:    Procedure Laterality Date  . AORTIC VALVE REPLACEMENT N/A 11/16/2016   Procedure: AORTIC VALVE REPLACEMENT (AVR);  Surgeon: Gaye Pollack, MD;  Location: [place];  Service: Open Heart Surgery;  Laterality: N/A;  52mm Perimount Magna Ease Pericardial Bioprosthesis  . BREAST EXCISIONAL BIOPSY Left 1984   benign  . CARDIAC CATHETERIZATION N/A 07/05/2016   Procedure: Right/Left Heart Cath and Coronary Angiography;  Surgeon: Burnell Blanks, MD;  Location: [place] [facility];  Service: Cardiovascular;  Laterality: N/A;  . CHOLECYSTECTOMY    . implantation of a Guidant biventricular ICD    . laparoscopic cholecystectomy with intraoperative cholangiogram  2003  . TEE WITHOUT CARDIOVERSION N/A 11/16/2016   Procedure: TRANSESOPHAGEAL ECHOCARDIOGRAM (TEE);  Surgeon: Gaye Pollack, MD;  Location: [place];  Service: Open Heart Surgery;  Laterality: N/A;  . TOTAL HIP ARTHROPLASTY Left 12/31/2019   Procedure: TOTAL HIP ARTHROPLASTY ANTERIOR APPROACH;  Surgeon: Rod Can, MD;  Location: [facility];  Service: Orthopedics;  Laterality: Left;     reports that she has been smoking cigarettes. She has a 10.00 pack-year smoking history. She has never used smokeless tobacco. She reports current alcohol use. She reports that she does not use drugs.  Allergies  Allergen Reactions  . Trospium Chloride Er Other (See Comments)    Constipation     Family History  Problem Relation Age of Onset  . Pancreatic cancer Mother   . Colon cancer Maternal Aunt   . Bone cancer Brother   . Heart attack  Brother   . Hypercalcemia Neg Hx     Prior to Admission medications   Medication Sig Start Date End Date Taking? Authorizing Provider  alum & mag hydroxide-simeth (MAALOX/MYLANTA) 200-200-20 MG/5ML suspension Take 30 mLs by mouth every 6 (six) hours as needed for indigestion or heartburn.    Yes [provider]  aspirin 325 MG tablet Take 325 mg by mouth daily.   Yes [provider]   carbidopa-levodopa (SINEMET IR) 25-100 MG tablet Take 1 tablet by mouth 3 (three) times daily. 01/02/20 03/09/29 Yes Arrien, Jimmy Picket, MD  carvedilol (COREG) 12.5 MG tablet Take 12.5 mg by mouth 2 (two) times a day. With food 12/16/18  Yes [provider]  divalproex (DEPAKOTE SPRINKLE) 125 MG capsule Take 125 mg by mouth daily.   Yes [provider]  donepezil (ARICEPT) 10 MG tablet Take 1 tablet (10 mg total) by mouth daily. 01/02/20 03/09/29 Yes Arrien, Jimmy Picket, MD  furosemide (LASIX) 20 MG tablet Take 20 mg by mouth daily.  01/08/19  Yes [provider]  guaifenesin (ROBITUSSIN) 100 MG/5ML syrup Take 200 mg by mouth every 6 (six) hours as needed for cough.    Yes [provider]  irbesartan (AVAPRO) 75 MG tablet Take 75 mg by mouth daily.   Yes [provider]  loperamide (IMODIUM) 2 MG capsule Take 2 mg by mouth as needed (with each loose stool/diarrhea- not to exceed 8 doses in 24 hours).    Yes [provider]  LORazepam (ATIVAN) 0.5 MG tablet Take 0.5 mg by mouth every evening.    Yes [provider]  magnesium hydroxide (MILK OF MAGNESIA) 400 MG/5ML suspension Take 30 mLs by mouth at bedtime as needed for mild constipation.   Yes [provider]  Multiple Vitamin (MULTIVITAMIN WITH MINERALS) TABS tablet Take 1 tablet by mouth daily.    Yes [provider]  Neomycin-Bacitracin-Polymyxin (TRIPLE ANTIBIOTIC) 3.5-8045873038 OINT Apply 1 application topically as needed (for minor skin tears or abrasions, after cleaning with normal saline, then cover with Band-Aid or gauze and tape).    Yes [provider]  NON FORMULARY Take 120 mLs by mouth See admin instructions. Mighty Shakes- Drink 120 ml's by mouth two times a day   Yes [provider]  olmesartan (BENICAR) 20 MG tablet Take 20 mg by mouth daily. 10/04/19  Yes [provider]  Polyethyl Glycol-Propyl Glycol (SYSTANE OP) Place 1 drop  into both eyes daily as needed (for dry eyes.).    Yes [provider]  polyethylene glycol (MIRALAX / GLYCOLAX) 17 g packet Take 17 g by mouth daily.   Yes [provider]  rosuvastatin (CRESTOR) 10 MG tablet Take 1 tablet (10 mg total) by mouth daily. 05/01/11  Yes Evans Lance, MD  sertraline (ZOLOFT) 100 MG tablet Take 1 tablet (100 mg total) by mouth daily. Patient taking differently: Take 100 mg by mouth at bedtime.  02/28/19  Yes Cameron Sprang, MD  acetaminophen (TYLENOL) 325 MG tablet Take 2 tablets (650 mg total) by mouth 3 (three) times daily. Patient not taking: Reported on 04/13/2020 01/02/20   Arrien, Jimmy Picket, MD  oxyCODONE (OXY IR/ROXICODONE) 5 MG immediate release tablet Take 0.5 tablets (2.5 mg total) by mouth every 4 (four) hours as needed for moderate pain. Patient not taking: Reported on 04/13/2020 01/02/20   Arrien, Jimmy Picket, MD    Physical Exam: Vitals:   04/13/20 1358 04/13/20 1401 04/13/20 1800  BP:  Marland Kitchen)  183/144 (!) 167/122  Pulse:  96 64  Resp:  16 18  Temp:  98.2 F (36.8 C)   TempSrc:  Oral   SpO2:  95% 98%  Weight: 60 kg    Height: 5' (1.524 m)      Constitutional: NAD, calm, comfortable Vitals:   04/13/20 1358 04/13/20 1401 04/13/20 1800  BP:  (!) 183/144 (!) 167/122  Pulse:  96 64  Resp:  16 18  Temp:  98.2 F (36.8 C)   TempSrc:  Oral   SpO2:  95% 98%  Weight: 60 kg    Height: 5' (1.524 m)     Eyes: PERRL, lids and conjunctivae normal ENMT: Mucous membranes are dry. Posterior pharynx clear of any exudate or lesions.Normal dentition.  Neck: normal, supple, no masses, no thyromegaly Respiratory: clear to auscultation bilaterally, no wheezing, no crackles. Normal respiratory effort. No accessory muscle use.  Cardiovascular: Regular rate and rhythm, no murmurs / rubs / gallops. No extremity edema. 2+ pedal pulses. No carotid bruits.  Abdomen: no tenderness, no masses palpated. No hepatosplenomegaly. Bowel sounds  positive.  Musculoskeletal: no clubbing / cyanosis. No joint deformity upper and lower extremities. Good ROM, no contractures. Normal muscle tone.  Skin: no rashes, lesions, ulcers. No induration Neurologic: Moving all limbs, following simple command Psychiatric: Oriented to herself confused to time and place   Labs on Admission: I have personally reviewed following labs and imaging studies  CBC: Recent Labs  Lab 04/13/20 1428  WBC 5.8  HGB 14.0  HCT 49.2*  MCV 90.6  PLT 563*   Basic Metabolic Panel: Recent Labs  Lab 04/13/20 1428  NA 154*  K 3.7  CL 117*  CO2 24  GLUCOSE 109*  BUN 36*  CREATININE 1.24*  CALCIUM 11.2*   GFR: Estimated Creatinine Clearance: 36.1 mL/min (A) (by C-G formula based on SCr of 1.24 mg/dL (H)). Liver Function Tests: Recent Labs  Lab 04/13/20 1428  AST 33  ALT 30  ALKPHOS 99  BILITOT 1.0  PROT 8.8*  ALBUMIN 3.8   No results for input(s): LIPASE, AMYLASE in the last 168 hours. Recent Labs  Lab 04/13/20 1651  AMMONIA 36*   Coagulation Profile: No results for input(s): INR, PROTIME in the last 168 hours. Cardiac Enzymes: No results for input(s): CKTOTAL, CKMB, CKMBINDEX, TROPONINI in the last 168 hours. BNP (last 3 results) No results for input(s): PROBNP in the last 8760 hours. HbA1C: No results for input(s): HGBA1C in the last 72 hours. CBG: Recent Labs  Lab 04/13/20 1410  GLUCAP 86   Lipid Profile: No results for input(s): CHOL, HDL, LDLCALC, TRIG, CHOLHDL, LDLDIRECT in the last 72 hours. Thyroid Function Tests: No results for input(s): TSH, T4TOTAL, FREET4, T3FREE, THYROIDAB in the last 72 hours. Anemia Panel: No results for input(s): VITAMINB12, FOLATE, FERRITIN, TIBC, IRON, RETICCTPCT in the last 72 hours. Urine analysis:    Component Value Date/Time   COLORURINE YELLOW 01/21/2020 0217   APPEARANCEUR CLEAR 01/21/2020 0217   LABSPEC 1.023 01/21/2020 0217   PHURINE 5.0 01/21/2020 0217   GLUCOSEU NEGATIVE  01/21/2020 0217   HGBUR NEGATIVE 01/21/2020 0217   BILIRUBINUR NEGATIVE 01/21/2020 0217   KETONESUR NEGATIVE 01/21/2020 0217   PROTEINUR 100 (A) 01/21/2020 0217   NITRITE NEGATIVE 01/21/2020 0217   LEUKOCYTESUR NEGATIVE 01/21/2020 0217    Radiological Exams on Admission: CT Head Wo Contrast  Result Date: 04/13/2020 CLINICAL DATA:  Encephalopathy. Additional provided: Hypertension, failure to thrive, fatigue. Osteoarthritis, cervical. EXAM: CT HEAD WITHOUT CONTRAST CT  CERVICAL SPINE WITHOUT CONTRAST TECHNIQUE: Multidetector CT imaging of the head and cervical spine was performed following the standard protocol without intravenous contrast. Multiplanar CT image reconstructions of the cervical spine were also generated. COMPARISON:  Head CT 02/23/2020, CT cervical spine 12/29/2019. FINDINGS: CT HEAD FINDINGS Brain: Stable, moderate generalized parenchymal atrophy which is advanced for age. Redemonstrated small chronic cortical/subcortical infarct within the right parietal lobe (series 4, image 23) Stable, moderate ill-defined hypoattenuation within the cerebral white matter which is nonspecific, but consistent with chronic small vessel ischemic disease redemonstrated chronic lacunar infarct within the left caudate head. There is no acute intracranial hemorrhage. No acute demarcated cortical infarct is identified. No extra-axial fluid collection. No evidence of intracranial mass. No midline shift. Vascular: No hyperdense vessel.  Atherosclerotic calcifications Skull: Normal. Negative for fracture or focal lesion. Sinuses/Orbits: Visualized orbits show no acute finding. Mild ethmoid sinus mucosal thickening. Left mastoid effusion. CT CERVICAL SPINE FINDINGS Alignment: Cervical dextrocurvature. Straightening of the expected cervical lordosis. Trace C6-C7 and C7-T1 grade 1 anterolisthesis. Skull base and vertebrae: The basion-dental and atlanto-dental intervals are maintained.No evidence of acute fracture to  the cervical spine. Soft tissues and spinal canal: No prevertebral fluid or swelling. No visible canal hematoma. Disc levels: Mild for age cervical spondylosis. There is mild-to-moderate multilevel disc space narrowing. Small C2-C3 right center disc protrusion. Shallow C3-C4 disc bulge. Shallow C4-C5 disc bulge. Shallow C5-C6 disc bulge. Shallow C6-C7 disc bulge. No more than mild spinal canal stenosis at any level. Multilevel uncovertebral hypertrophy. Minimal multilevel facet arthrosis. Mild/moderate C3-C4 right neural foraminal narrowing. No more than mild bony neural foraminal narrowing at the remaining levels. Upper chest: No consolidation within the imaged lung apices. No visible pneumothorax. Other: Calcified atherosclerotic plaque within the visualized proximal major branch vessels of the neck and within the carotid arteries. IMPRESSION: CT head: 1. No evidence of acute intracranial abnormality. 2. Redemonstrated small chronic cortical/subcortical right parietal lobe infarct. Stable moderate generalized parenchymal atrophy and chronic small vessel ischemic disease. Redemonstrated chronic left basal ganglia lacunar infarct. 3. Mild ethmoid sinus mucosal thickening. 4. Left mastoid effusion CT cervical spine: 1. No evidence of acute fracture to the cervical spine 2. Cervical dextrocurvature. 3. Minimal C6-C7 and C7-T1 grade 1 anterolisthesis. 4. Cervical spondylosis as outlined. No more than mild appreciable spinal canal stenosis at any level. Multilevel neural foraminal narrowing, greatest on the right at C3-C4 (mild/moderate in severity at this site). Electronically Signed   By: Kellie Simmering DO   On: 04/13/2020 17:41   CT Cervical Spine Wo Contrast  Result Date: 04/13/2020 CLINICAL DATA:  Encephalopathy. Additional provided: Hypertension, failure to thrive, fatigue. Osteoarthritis, cervical. EXAM: CT HEAD WITHOUT CONTRAST CT CERVICAL SPINE WITHOUT CONTRAST TECHNIQUE: Multidetector CT imaging of the head  and cervical spine was performed following the standard protocol without intravenous contrast. Multiplanar CT image reconstructions of the cervical spine were also generated. COMPARISON:  Head CT 02/23/2020, CT cervical spine 12/29/2019. FINDINGS: CT HEAD FINDINGS Brain: Stable, moderate generalized parenchymal atrophy which is advanced for age. Redemonstrated small chronic cortical/subcortical infarct within the right parietal lobe (series 4, image 23) Stable, moderate ill-defined hypoattenuation within the cerebral white matter which is nonspecific, but consistent with chronic small vessel ischemic disease redemonstrated chronic lacunar infarct within the left caudate head. There is no acute intracranial hemorrhage. No acute demarcated cortical infarct is identified. No extra-axial fluid collection. No evidence of intracranial mass. No midline shift. Vascular: No hyperdense vessel.  Atherosclerotic calcifications Skull: Normal. Negative for fracture or focal  lesion. Sinuses/Orbits: Visualized orbits show no acute finding. Mild ethmoid sinus mucosal thickening. Left mastoid effusion. CT CERVICAL SPINE FINDINGS Alignment: Cervical dextrocurvature. Straightening of the expected cervical lordosis. Trace C6-C7 and C7-T1 grade 1 anterolisthesis. Skull base and vertebrae: The basion-dental and atlanto-dental intervals are maintained.No evidence of acute fracture to the cervical spine. Soft tissues and spinal canal: No prevertebral fluid or swelling. No visible canal hematoma. Disc levels: Mild for age cervical spondylosis. There is mild-to-moderate multilevel disc space narrowing. Small C2-C3 right center disc protrusion. Shallow C3-C4 disc bulge. Shallow C4-C5 disc bulge. Shallow C5-C6 disc bulge. Shallow C6-C7 disc bulge. No more than mild spinal canal stenosis at any level. Multilevel uncovertebral hypertrophy. Minimal multilevel facet arthrosis. Mild/moderate C3-C4 right neural foraminal narrowing. No more than mild  bony neural foraminal narrowing at the remaining levels. Upper chest: No consolidation within the imaged lung apices. No visible pneumothorax. Other: Calcified atherosclerotic plaque within the visualized proximal major branch vessels of the neck and within the carotid arteries. IMPRESSION: CT head: 1. No evidence of acute intracranial abnormality. 2. Redemonstrated small chronic cortical/subcortical right parietal lobe infarct. Stable moderate generalized parenchymal atrophy and chronic small vessel ischemic disease. Redemonstrated chronic left basal ganglia lacunar infarct. 3. Mild ethmoid sinus mucosal thickening. 4. Left mastoid effusion CT cervical spine: 1. No evidence of acute fracture to the cervical spine 2. Cervical dextrocurvature. 3. Minimal C6-C7 and C7-T1 grade 1 anterolisthesis. 4. Cervical spondylosis as outlined. No more than mild appreciable spinal canal stenosis at any level. Multilevel neural foraminal narrowing, greatest on the right at C3-C4 (mild/moderate in severity at this site). Electronically Signed   By: Kellie Simmering DO   On: 04/13/2020 17:41   DG Chest Port 1 View  Result Date: 04/13/2020 CLINICAL DATA:  Weakness failure to thrive EXAM: PORTABLE CHEST 1 VIEW COMPARISON:  12/29/2019 FINDINGS: Post sternotomy changes and valve prosthesis. Left-sided pacing device as before. No focal airspace disease or effusion. Normal cardiomediastinal silhouette with aortic atherosclerosis. No pneumothorax. IMPRESSION: No active disease. Electronically Signed   By: Donavan Foil M.D.   On: 04/13/2020 15:45    EKG: Independently reviewed.  Ventricularly paced  Assessment/Plan Active Problems:   Dehydration, severe  (please populate well all problems here in Problem List. (For example, if patient is on BP meds at home and you resume or decide to hold them, it is a problem that needs to be her. Same for CAD, COPD, HLD and so on)  Severe dehydration with hypernatremia -Calculated free water  deficit 2.7 L, will D5 W, plan to correct sodium level in next 24 to 48 hours.  Acute encephalopathy -Probably from severe dehydration, hydration as above -UA pending, rule out UTI -TSH sent rule out hypothyroidism -Patient also has elevated calcium level, which can be attributed to severe dehydration, will recheck calcium level after hydration, hold off further work-up for now.  Anorexia -No clear etiology, will correct dehydration first, and then reevaluate. -Check TSH and patient denied any feeling of depression -Discussed with patient son over the phone, regarding if anorexia persists and probably will need feeding tube.  Will reevaluate after dehydration corrected, patient son wants his cousin involved if feeding tube becomes necessary. -Speech evaluation in AM  Uncontrolled hypertension -Noncompliant, will restart her BP meds but hold Lasix -But overall doubt patient will take p.o. meds tonight, will start as needed IV hydralazine  Parkinson's -Resume Sinemet  DVT prophylaxis: Lovenox  code Status: Full Code Family Communication: Son Disposition Plan: Patient has complicated medical  problems dehydration/hypernatremia, mentation changes and anorexia  consults called: None Admission status: Tele admit   Lequita Halt MD [facility] Pager [phone]  04/13/2020, 6:34 PM

## 2020-04-13 NOTE — ED Triage Notes (Signed)
Arrived via EMS from [facility] for increased confusion, not eating or drinking for 3 days, and not medications for 1 day. EMS reported BP 150/130 and 177/132. Patient alert to name and birthday able to follow commands. Has history of dementia and Parkinson's disease.

## 2020-04-14 ENCOUNTER — Other Ambulatory Visit: Payer: Self-pay

## 2020-04-14 ENCOUNTER — Other Ambulatory Visit: Payer: Self-pay | Admitting: *Deleted

## 2020-04-14 DIAGNOSIS — E860 Dehydration: Principal | ICD-10-CM

## 2020-04-14 LAB — CBC
HCT: 41.7 % (ref 36.0–46.0)
Hemoglobin: 12.3 g/dL (ref 12.0–15.0)
MCH: 26.3 pg (ref 26.0–34.0)
MCHC: 29.5 g/dL — ABNORMAL LOW (ref 30.0–36.0)
MCV: 89.1 fL (ref 80.0–100.0)
Platelets: 107 10*3/uL — ABNORMAL LOW (ref 150–400)
RBC: 4.68 MIL/uL (ref 3.87–5.11)
RDW: 20.2 % — ABNORMAL HIGH (ref 11.5–15.5)
WBC: 4.3 10*3/uL (ref 4.0–10.5)
nRBC: 0 % (ref 0.0–0.2)

## 2020-04-14 LAB — BASIC METABOLIC PANEL
Anion gap: 8 (ref 5–15)
BUN: 31 mg/dL — ABNORMAL HIGH (ref 8–23)
CO2: 24 mmol/L (ref 22–32)
Calcium: 10.4 mg/dL — ABNORMAL HIGH (ref 8.9–10.3)
Chloride: 119 mmol/L — ABNORMAL HIGH (ref 98–111)
Creatinine, Ser: 0.89 mg/dL (ref 0.44–1.00)
GFR calc Af Amer: >60 mL/min (ref >60)
GFR calc non Af Amer: >60 mL/min (ref >60)
Glucose, Bld: 130 mg/dL — ABNORMAL HIGH (ref 70–99)
Potassium: 3.5 mmol/L (ref 3.5–5.1)
Sodium: 151 mmol/L — ABNORMAL HIGH (ref 135–145)

## 2020-04-14 LAB — MRSA PCR SCREENING: MRSA by PCR: NEGATIVE

## 2020-04-14 LAB — TSH: TSH: 2.01 u[IU]/mL (ref 0.350–4.500)

## 2020-04-14 MED ORDER — HYDRALAZINE HCL 20 MG/ML IJ SOLN
10.0000 mg | INTRAMUSCULAR | Status: DC | PRN
  Administered 2020-04-15 – 2020-05-26 (×8): 10 mg via INTRAVENOUS
  Filled 2020-04-14 (×8): qty 1

## 2020-04-14 MED ORDER — CHLORHEXIDINE GLUCONATE 0.12 % MT SOLN
15.0000 mL | Freq: Two times a day (BID) | OROMUCOSAL | Status: DC
  Administered 2020-04-14 – 2020-05-26 (×83): 15 mL via OROMUCOSAL
  Filled 2020-04-14 (×83): qty 15

## 2020-04-14 MED ORDER — ENSURE ENLIVE PO LIQD
237.0000 mL | Freq: Three times a day (TID) | ORAL | Status: DC
  Administered 2020-04-14 – 2020-04-22 (×22): 237 mL via ORAL
  Filled 2020-04-14: qty 237

## 2020-04-14 MED ORDER — ORAL CARE MOUTH RINSE
15.0000 mL | Freq: Two times a day (BID) | OROMUCOSAL | Status: DC
  Administered 2020-04-14 – 2020-05-28 (×76): 15 mL via OROMUCOSAL

## 2020-04-14 NOTE — Patient Outreach (Signed)
  [facility]) Care Management Chronic Special Needs Program    04/14/2020  Name: [name], DOB: 1956-03-26  MRN: [id number]   [name] is enrolled in a chronic special needs plan for Heart Failure. Telephone call to [place] with [facility] assisted living.  Jenny Reichmann states client was admitted to the hospital on yesterday. She reports client recently stopped eating and drinking over the last several days even after numerous attempts by staff to assist.    PLAN:  RNCM will continue to follow as client's CSNP care management coordinator.

## 2020-04-14 NOTE — Patient Outreach (Signed)
[facility]) Care Management  04/14/2020  [name] 10/09/1955 [id number]  CSW made routine follow up call to pt's son who reports pt is in the hospital.  Per son, pt may have been dehydrated; not eating well.  Pt also states he did call DSS Medicaid worker who instructed him he will need to get pt's MD/Provider to sign a new FL2 indicating the need for a higher level of care to then proceed with SNF placement and long term care LTC Medicaid.    CSW suggested to son that he speak with hospital team regarding this when he goes to visit this afternoon.  CSW will also attempt outreach to hospital team.   [name], MSW, [place] Worker  [place] [phone]

## 2020-04-14 NOTE — Progress Notes (Signed)
New Admission Note:   Arrival Method: stretcher Mental Orientation: alert Telemetry: box 4 Assessment: Completed Skin: see flow sheet IV: NS at 100cc/hr Pain: none Tubes: none Safety Measures: Safety Fall Prevention Plan has been discussed Admission: Completed [facility] Orientation: Patient has been orientated to the room, unit and staff.  Family: none at bedside  Orders have been reviewed and implemented. Will continue to monitor the patient. Call light has been placed within reach and bed alarm has been activated.   [name] BSN, RN Phone number: [phone]

## 2020-04-14 NOTE — Patient Outreach (Signed)
  [facility]) Care Management Chronic Special Needs Program   04/14/2020  Name: MAEOLA MCHANEY, DOB: 08-10-1956  MRN: [id number]  The client was discussed in today's interdisciplinary care team meeting.  The following issues were discussed:  Client's needs, Changes in health status, Key risk triggers/risk stratification, Care Plan, Coordination of care, Care transitions and Issues/barriers to care  Client is currently admitted to [facility].   Participants present::  [name], BSN, MS, CCM  [name], BSN, CCM   Melissa Sandlin, BSN, CCM, CDE  Thea Silversmith, BSN, MSN, CCM  [facility], CBCC/ CMAA  Dr. Gracy Bruins, RN, MSN-[facility]  Shary Key, Supervisor-[facility]  Karrie Meres, Pharm D HTA   Recommendations:  RNCM to assist [facility] with coordinating contact with client's son.   Plan:  RNCM will continue to follow for care coordination needs and follow up with client as needed.   [name] RN,BSN,CCM [facility] Care Management [phone]

## 2020-04-14 NOTE — Consult Note (Signed)
   [facility] Inpatient Consult   04/14/2020  [name] January 08, 1956 [id number]  [employer] CSNP  Patient is currently active with [place] Coordinator for  chronic disease management services for Heart Failure.  Patient has been engaged by a [place] community based plan of care has focused on disease management and community resource support.   Patient was from [facility] facility.     Plan: Follow progress and disposition and update team of transition of care needs.  Will make Inpatient Transition Of Care [TOC] team member aware that [place] Coordinator is  following.   Of note, [facility] Care Management services does not replace or interfere with any services that are needed or arranged by inpatient [facility] care management team.  For additional questions or referrals please contact:   Natividad Brood, RN BSN [facility] Liaison  [phone] business mobile phone Toll free office [phone]  Fax number: [phone] [email or web address]

## 2020-04-14 NOTE — Evaluation (Signed)
Physical Therapy Evaluation Patient Details Name: [name] MRN: [id number] DOB: 12-25-1955 Today's Date: 04/14/2020   History of Present Illness  [name] is a 64 y.o. female with medical history significant of advanced dementia, Parkinson disease, hypertension, hypercalcemia, chronic diastolic CHF, presented with worsening confusion and not eating or drinking for 3 days.  Clinical Impression   Pt admitted with above diagnosis. Unsure if she comes from SNF-level care or ALF level care; Tells me she was walking with RW, and per chart review, she was able to walk short distances with RW and PT last admission; Presents to PT with functional decline -- requiring max assist for transfer OOB to chair, and heavy posterior lean; Unable to walk today due to heavy posterior lean;  Pt currently with functional limitations due to the deficits listed below (see PT Problem List). Pt will benefit from skilled PT to increase their independence and safety with mobility to allow discharge to the venue listed below.       Follow Up Recommendations SNF;Other (comment) (for patients with dementia, I favor trying to get her back to her familiar setting, routine, and caregivers; if she is from ALF, will need to find out if they can meet her care needs)    Equipment Recommendations  Other (comment) (to be determined)    Recommendations for Other Services       Precautions / Restrictions Precautions Precautions: Fall      Mobility  Bed Mobility Overal bed mobility: Needs Assistance Bed Mobility: Rolling;Sidelying to Sit Rolling: Mod assist Sidelying to sit: Mod assist       General bed mobility comments: Mod assist to roll, with cues to initiate with reach across for bed rail; Heavy mod assist to help feet off the EOB and elevate trunk to sit; heavy posterior lean  Transfers Overall transfer level: Needs assistance Equipment used: 1 person hand held assist Transfers: Stand Pivot  Transfers   Stand pivot transfers: Max assist       General transfer comment: Max assist and knees blocked for stability to rise and turn; Heavy posterior lean, needing max assist to maintian standing during transfer; Able to reach back for armrests to lower self to chair with cues  Ambulation/Gait                Stairs            Wheelchair Mobility    Modified Rankin (Stroke Patients Only)       Balance Overall balance assessment: Needs assistance Sitting-balance support: Feet supported Sitting balance-Leahy Scale: Poor Sitting balance - Comments: tending to lean posteriorly Postural control: Posterior lean   Standing balance-Leahy Scale: Zero Standing balance comment: Heavy posterior bias                             Pertinent Vitals/Pain Pain Assessment: No/denies pain    Home Living Family/patient expects to be discharged to:: Assisted living               Home Equipment: Gilford Rile - 2 wheels Additional Comments: Unsure if pt is from ALF or SNF    Prior Function Level of Independence: Needs assistance   Gait / Transfers Assistance Needed: walks with RW; last admission she reported she has staff assist to ambulate  ADL's / Homemaking Assistance Needed: Staff has to assist with bathing/dressing  Comments: Will need to verify assistance where she lives     Hand Dominance  Dominant Hand: Right    Extremity/Trunk Assessment   Upper Extremity Assessment Upper Extremity Assessment: Generalized weakness    Lower Extremity Assessment Lower Extremity Assessment: Generalized weakness    Cervical / Trunk Assessment Cervical / Trunk Assessment: Kyphotic  Communication   Communication: No difficulties  Cognition Arousal/Alertness: Awake/alert Behavior During Therapy: WFL for tasks assessed/performed Overall Cognitive Status: No family/caregiver present to determine baseline cognitive functioning                                  General Comments: Tends to give short answers; very agreeable -- not sure of reliability as a historian      General Comments      Exercises     Assessment/Plan    PT Assessment Patient needs continued PT services  PT Problem List Decreased strength;Decreased range of motion;Decreased activity tolerance;Decreased balance;Decreased mobility;Decreased coordination;Decreased cognition;Decreased knowledge of use of DME;Decreased safety awareness;Decreased knowledge of precautions       PT Treatment Interventions DME instruction;Gait training;Functional mobility training;Therapeutic activities;Therapeutic exercise;Balance training;Patient/family education;Cognitive remediation;Neuromuscular re-education    PT Goals (Current goals can be found in the Care Plan section)  Acute Rehab PT Goals Patient Stated Goal: did not state, but agreeable to OOB PT Goal Formulation: Patient unable to participate in goal setting Time For Goal Achievement: 04/28/20 Potential to Achieve Goals: Good    Frequency Min 3X/week   Barriers to discharge Other (comment) Will need more info re: available assist at home    Co-evaluation               AM-PAC PT "6 Clicks" Mobility  Outcome Measure Help needed turning from your back to your side while in a flat bed without using bedrails?: A Lot Help needed moving from lying on your back to sitting on the side of a flat bed without using bedrails?: A Lot Help needed moving to and from a bed to a chair (including a wheelchair)?: A Lot Help needed standing up from a chair using your arms (e.g., wheelchair or bedside chair)?: A Lot Help needed to walk in hospital room?: Total Help needed climbing 3-5 steps with a railing? : Total 6 Click Score: 10    End of Session Equipment Utilized During Treatment: Gait belt Activity Tolerance: Patient tolerated treatment well Patient left: in chair;with call bell/phone within reach;with chair alarm  set Nurse Communication: Mobility status PT Visit Diagnosis: Unsteadiness on feet (R26.81);Other abnormalities of gait and mobility (R26.89);Muscle weakness (generalized) (M62.81);History of falling (Z91.81)    Time: 2440-1027 PT Time Calculation (min) (ACUTE ONLY): 18 min   Charges:   PT Evaluation $PT Eval Moderate Complexity: 1 Mod          Roney [place], [place]  Acute Rehabilitation Services Pager [phone] Office [phone]   Colletta [place] 04/14/2020, 2:27 PM

## 2020-04-14 NOTE — Progress Notes (Signed)
PROGRESS NOTE    [name]  HYQ:[id number] DOB: 1956/03/06 DOA: 04/13/2020 PCP: Lujean Amel, MD   Brief Narrative:  HPI: [name] is a 64 y.o. female with medical history significant of advanced dementia, Parkinson disease, hypertension, hypercalcemia, chronic diastolic CHF, presented with worsening confusion and not eating or drinking for 3 days.  Try to reach assisted living facility not available, discussed with patient's son over the phone who provides some of the information.  Also reported the patient probably stopped taking her medications for the last 3 days as well.  Patient was awake time I talked to her at bedside, denied any pain, nauseous vomit no diarrhea.  She denied any feeling of depression, but reports she just does not want to eat or drink not feeling hungry or thirsty. ED Course: Severely hydrated, sodium 154, chloride 117, calcium 11.2.  X-ray showing chronic stroke no structural changes or bleeding.  Assessment & Plan:   Active Problems:   Dehydration, severe   Dehydration   Severe dehydration with hypernatremia: Looks better than yesterday.  Sodium improved to 151.  Continue dextrose and repeat labs.  Acute encephalopathy: Likely secondary to dehydration and hyponatremia.  TSH normal.  Very low valproic acid level.  Now alert and mostly oriented with little prompts.  Tried to call patient's niece per her request to find out what her baseline is.  Left a voicemail.  Anorexia: No clear etiology but aging could be the main cause.  Now looks better.  Will consult nutrition.  Will order Ensure 3 times daily per recommendation from SLP.  Hypercalcemia: Likely due to dehydration.  Improving.  Continue IV fluids.  Uncontrolled hypertension: Noncompliant blood pressure now better.  Continue home medications.  Parkinson's -Resume Sinemet  DVT prophylaxis: enoxaparin (LOVENOX) injection 40 mg Start: 04/13/20 1900   Code Status: Full Code  Family  Communication:  None present at bedside.  Plan of care discussed with patient in length and he verbalized understanding and agreed with it.  Tried to call patient's niece per her request but no answer.  Left voicemail.  Status is: Inpatient  Remains inpatient appropriate because:Persistent severe electrolyte disturbances   Dispo: The patient is from: Home              Anticipated d/c is to: TBD, therapy assessment pending.              Anticipated d/c date is: 1 day              Patient currently is not medically stable to d/c.        Estimated body mass index is 21.7 kg/m as calculated from the following:   Height as of this encounter: 5' (1.524 m).   Weight as of this encounter: 50.4 kg.      Nutritional status:               Consultants:   None  Procedures:   None  Antimicrobials:  Anti-infectives (From admission, onward)   None         Subjective: Seen and examined.  No complaints.  She is mostly alert and also oriented with little prompts.  Objective: Vitals:   04/14/20 0335 04/14/20 0524 04/14/20 0850 04/14/20 0920  BP: (!) 125/91 (!) 135/92  138/85  Pulse: (!) 58 (!) 58 80 62  Resp: 14 15  16   Temp: (!) 97.5 F (36.4 C) 97.7 F (36.5 C)    TempSrc: Oral Oral    SpO2: 97%  100%  96%  Weight:      Height:        Intake/Output Summary (Last 24 hours) at 04/14/2020 1304 Last data filed at 04/14/2020 1240 Gross per 24 hour  Intake 1855.77 ml  Output 200 ml  Net 1655.77 ml   Filed Weights   04/13/20 1358 04/14/20 0135  Weight: 60 kg 50.4 kg    Examination:  General exam: Appears calm and comfortable, very lean. Respiratory system: Clear to auscultation. Respiratory effort normal. Cardiovascular system: S1 & S2 heard, RRR. No JVD, murmurs, rubs, gallops or clicks. No pedal edema. Gastrointestinal system: Abdomen is nondistended, soft and nontender. No organomegaly or masses felt. Normal bowel sounds heard. Central nervous system:  Alert and oriented. No focal neurological deficits. Extremities: Symmetric 5 x 5 power. Skin: No rashes, lesions or ulcers Psychiatry: Judgement and insight appear normal. Mood & affect appropriate.    Data Reviewed: I have personally reviewed following labs and imaging studies  CBC: Recent Labs  Lab 04/13/20 1428 04/14/20 0627  WBC 5.8 4.3  HGB 14.0 12.3  HCT 49.2* 41.7  MCV 90.6 89.1  PLT 140* 431*   Basic Metabolic Panel: Recent Labs  Lab 04/13/20 1428 04/14/20 0627  NA 154* 151*  K 3.7 3.5  CL 117* 119*  CO2 24 24  GLUCOSE 109* 130*  BUN 36* 31*  CREATININE 1.24* 0.89  CALCIUM 11.2* 10.4*   GFR: Estimated Creatinine Clearance: 44.7 mL/min (by C-G formula based on SCr of 0.89 mg/dL). Liver Function Tests: Recent Labs  Lab 04/13/20 1428  AST 33  ALT 30  ALKPHOS 99  BILITOT 1.0  PROT 8.8*  ALBUMIN 3.8   No results for input(s): LIPASE, AMYLASE in the last 168 hours. Recent Labs  Lab 04/13/20 1651  AMMONIA 36*   Coagulation Profile: No results for input(s): INR, PROTIME in the last 168 hours. Cardiac Enzymes: No results for input(s): CKTOTAL, CKMB, CKMBINDEX, TROPONINI in the last 168 hours. BNP (last 3 results) No results for input(s): PROBNP in the last 8760 hours. HbA1C: No results for input(s): HGBA1C in the last 72 hours. CBG: Recent Labs  Lab 04/13/20 1410  GLUCAP 86   Lipid Profile: No results for input(s): CHOL, HDL, LDLCALC, TRIG, CHOLHDL, LDLDIRECT in the last 72 hours. Thyroid Function Tests: Recent Labs    04/14/20 0627  TSH 2.010   Anemia Panel: No results for input(s): VITAMINB12, FOLATE, FERRITIN, TIBC, IRON, RETICCTPCT in the last 72 hours. Sepsis Labs: No results for input(s): PROCALCITON, LATICACIDVEN in the last 168 hours.  Recent Results (from the past 240 hour(s))  SARS Coronavirus 2 by RT PCR (hospital order, performed in [facility] hospital lab) Nasopharyngeal Nasopharyngeal Swab     Status: None   Collection  Time: 04/13/20  6:04 PM   Specimen: Nasopharyngeal Swab  Result Value Ref Range Status   SARS Coronavirus 2 NEGATIVE NEGATIVE Final    Comment: (NOTE) SARS-CoV-2 target nucleic acids are NOT DETECTED.  The SARS-CoV-2 RNA is generally detectable in upper and lower respiratory specimens during the acute phase of infection. The lowest concentration of SARS-CoV-2 viral copies this assay can detect is 250 copies / mL. A negative result does not preclude SARS-CoV-2 infection and should not be used as the sole basis for treatment or other patient management decisions.  A negative result may occur with improper specimen collection / handling, submission of specimen other than nasopharyngeal swab, presence of viral mutation(s) within the areas targeted by this assay, and inadequate number  of viral copies (<250 copies / mL). A negative result must be combined with clinical observations, patient history, and epidemiological information.  Fact Sheet for Patients:   StrictlyIdeas.no  Fact Sheet for Healthcare Providers: BankingDealers.co.za  This test is not yet approved or  cleared by the [place] FDA and has been authorized for detection and/or diagnosis of SARS-CoV-2 by FDA under an Emergency Use Authorization (EUA).  This EUA will remain in effect (meaning this test can be used) for the duration of the COVID-19 declaration under Section 564(b)(1) of the Act, 21 U.S.C. section 360bbb-3(b)(1), unless the authorization is terminated or revoked sooner.  Performed at [facility], [place] [address], [place], [place] [address]       Radiology Studies: CT Head Wo Contrast  Result Date: 04/13/2020 CLINICAL DATA:  Encephalopathy. Additional provided: Hypertension, failure to thrive, fatigue. Osteoarthritis, cervical. EXAM: CT HEAD WITHOUT CONTRAST CT CERVICAL SPINE WITHOUT CONTRAST TECHNIQUE: Multidetector CT imaging of the head and  cervical spine was performed following the standard protocol without intravenous contrast. Multiplanar CT image reconstructions of the cervical spine were also generated. COMPARISON:  Head CT 02/23/2020, CT cervical spine 12/29/2019. FINDINGS: CT HEAD FINDINGS Brain: Stable, moderate generalized parenchymal atrophy which is advanced for age. Redemonstrated small chronic cortical/subcortical infarct within the right parietal lobe (series 4, image 23) Stable, moderate ill-defined hypoattenuation within the cerebral white matter which is nonspecific, but consistent with chronic small vessel ischemic disease redemonstrated chronic lacunar infarct within the left caudate head. There is no acute intracranial hemorrhage. No acute demarcated cortical infarct is identified. No extra-axial fluid collection. No evidence of intracranial mass. No midline shift. Vascular: No hyperdense vessel.  Atherosclerotic calcifications Skull: Normal. Negative for fracture or focal lesion. Sinuses/Orbits: Visualized orbits show no acute finding. Mild ethmoid sinus mucosal thickening. Left mastoid effusion. CT CERVICAL SPINE FINDINGS Alignment: Cervical dextrocurvature. Straightening of the expected cervical lordosis. Trace C6-C7 and C7-T1 grade 1 anterolisthesis. Skull base and vertebrae: The basion-dental and atlanto-dental intervals are maintained.No evidence of acute fracture to the cervical spine. Soft tissues and spinal canal: No prevertebral fluid or swelling. No visible canal hematoma. Disc levels: Mild for age cervical spondylosis. There is mild-to-moderate multilevel disc space narrowing. Small C2-C3 right center disc protrusion. Shallow C3-C4 disc bulge. Shallow C4-C5 disc bulge. Shallow C5-C6 disc bulge. Shallow C6-C7 disc bulge. No more than mild spinal canal stenosis at any level. Multilevel uncovertebral hypertrophy. Minimal multilevel facet arthrosis. Mild/moderate C3-C4 right neural foraminal narrowing. No more than mild bony  neural foraminal narrowing at the remaining levels. Upper chest: No consolidation within the imaged lung apices. No visible pneumothorax. Other: Calcified atherosclerotic plaque within the visualized proximal major branch vessels of the neck and within the carotid arteries. IMPRESSION: CT head: 1. No evidence of acute intracranial abnormality. 2. Redemonstrated small chronic cortical/subcortical right parietal lobe infarct. Stable moderate generalized parenchymal atrophy and chronic small vessel ischemic disease. Redemonstrated chronic left basal ganglia lacunar infarct. 3. Mild ethmoid sinus mucosal thickening. 4. Left mastoid effusion CT cervical spine: 1. No evidence of acute fracture to the cervical spine 2. Cervical dextrocurvature. 3. Minimal C6-C7 and C7-T1 grade 1 anterolisthesis. 4. Cervical spondylosis as outlined. No more than mild appreciable spinal canal stenosis at any level. Multilevel neural foraminal narrowing, greatest on the right at C3-C4 (mild/moderate in severity at this site). Electronically Signed   By: Kellie Simmering DO   On: 04/13/2020 17:41   CT Cervical Spine Wo Contrast  Result Date: 04/13/2020 CLINICAL DATA:  Encephalopathy. Additional provided: Hypertension, failure to thrive, fatigue. Osteoarthritis, cervical. EXAM: CT HEAD WITHOUT CONTRAST CT CERVICAL SPINE WITHOUT CONTRAST TECHNIQUE: Multidetector CT imaging of the head and cervical spine was performed following the standard protocol without intravenous contrast. Multiplanar CT image reconstructions of the cervical spine were also generated. COMPARISON:  Head CT 02/23/2020, CT cervical spine 12/29/2019. FINDINGS: CT HEAD FINDINGS Brain: Stable, moderate generalized parenchymal atrophy which is advanced for age. Redemonstrated small chronic cortical/subcortical infarct within the right parietal lobe (series 4, image 23) Stable, moderate ill-defined hypoattenuation within the cerebral white matter which is nonspecific, but  consistent with chronic small vessel ischemic disease redemonstrated chronic lacunar infarct within the left caudate head. There is no acute intracranial hemorrhage. No acute demarcated cortical infarct is identified. No extra-axial fluid collection. No evidence of intracranial mass. No midline shift. Vascular: No hyperdense vessel.  Atherosclerotic calcifications Skull: Normal. Negative for fracture or focal lesion. Sinuses/Orbits: Visualized orbits show no acute finding. Mild ethmoid sinus mucosal thickening. Left mastoid effusion. CT CERVICAL SPINE FINDINGS Alignment: Cervical dextrocurvature. Straightening of the expected cervical lordosis. Trace C6-C7 and C7-T1 grade 1 anterolisthesis. Skull base and vertebrae: The basion-dental and atlanto-dental intervals are maintained.No evidence of acute fracture to the cervical spine. Soft tissues and spinal canal: No prevertebral fluid or swelling. No visible canal hematoma. Disc levels: Mild for age cervical spondylosis. There is mild-to-moderate multilevel disc space narrowing. Small C2-C3 right center disc protrusion. Shallow C3-C4 disc bulge. Shallow C4-C5 disc bulge. Shallow C5-C6 disc bulge. Shallow C6-C7 disc bulge. No more than mild spinal canal stenosis at any level. Multilevel uncovertebral hypertrophy. Minimal multilevel facet arthrosis. Mild/moderate C3-C4 right neural foraminal narrowing. No more than mild bony neural foraminal narrowing at the remaining levels. Upper chest: No consolidation within the imaged lung apices. No visible pneumothorax. Other: Calcified atherosclerotic plaque within the visualized proximal major branch vessels of the neck and within the carotid arteries. IMPRESSION: CT head: 1. No evidence of acute intracranial abnormality. 2. Redemonstrated small chronic cortical/subcortical right parietal lobe infarct. Stable moderate generalized parenchymal atrophy and chronic small vessel ischemic disease. Redemonstrated chronic left basal  ganglia lacunar infarct. 3. Mild ethmoid sinus mucosal thickening. 4. Left mastoid effusion CT cervical spine: 1. No evidence of acute fracture to the cervical spine 2. Cervical dextrocurvature. 3. Minimal C6-C7 and C7-T1 grade 1 anterolisthesis. 4. Cervical spondylosis as outlined. No more than mild appreciable spinal canal stenosis at any level. Multilevel neural foraminal narrowing, greatest on the right at C3-C4 (mild/moderate in severity at this site). Electronically Signed   By: Kellie Simmering DO   On: 04/13/2020 17:41   DG Chest Port 1 View  Result Date: 04/13/2020 CLINICAL DATA:  Weakness failure to thrive EXAM: PORTABLE CHEST 1 VIEW COMPARISON:  12/29/2019 FINDINGS: Post sternotomy changes and valve prosthesis. Left-sided pacing device as before. No focal airspace disease or effusion. Normal cardiomediastinal silhouette with aortic atherosclerosis. No pneumothorax. IMPRESSION: No active disease. Electronically Signed   By: Donavan Foil M.D.   On: 04/13/2020 15:45    Scheduled Meds: . aspirin  325 mg Oral Daily  . carbidopa-levodopa  1 tablet Oral TID  . carvedilol  12.5 mg Oral BID WC  . divalproex  125 mg Oral Daily  . donepezil  10 mg Oral Daily  . enoxaparin (LOVENOX) injection  40 mg Subcutaneous Q24H  . irbesartan  75 mg Oral Daily  . labetalol  20 mg Intravenous Once  . LORazepam  0.5 mg Oral QPM  . multivitamin with minerals  1 tablet Oral Daily  . polyethylene glycol  17 g Oral Daily  . rosuvastatin  10 mg Oral Daily  . sertraline  100 mg Oral QHS  . sodium chloride flush  3 mL Intravenous Once   Continuous Infusions: . dextrose 100 mL/hr at 04/13/20 2232     LOS: 1 day   Time spent: 33 minutes   Darliss Cheney, MD [facility]  04/14/2020, 1:04 PM   To contact the attending provider between 7A-7P or the covering provider during after hours 7P-7A, please log into the web site [email or web address].

## 2020-04-14 NOTE — Progress Notes (Signed)
Patient alert and oriented x 1 but pleasantly confused,pt follows simple commands no family members at bedside.

## 2020-04-14 NOTE — Evaluation (Signed)
Clinical/Bedside Swallow Evaluation Patient Details  Name: [name] MRN: [id number] Date of Birth: 10-Dec-1955  Today's Date: 04/14/2020 Time: SLP Start Time (ACUTE ONLY): 0739 SLP Stop Time (ACUTE ONLY): 0820 SLP Time Calculation (min) (ACUTE ONLY): 41 min  Past Medical History:  Past Medical History:  Diagnosis Date  . Abnormal liver function tests   . AICD (automatic cardioverter/defibrillator) present    [employer]  . Anxiety   . Aortic stenosis   . Benign neoplasm of colon 04/19/2001   Hyperplastic  . Bicuspid aortic valve   . Cardiomyopathy secondary   . CHF (congestive heart failure) ([place])   . Coronary artery disease   . Depression   . Headache   . Heart murmur   . HTN (hypertension)   . Hypercalcemia   . Hyperlipidemia   . Memory changes 06/2018   in the last year /per the brother  . S/P ICD (internal cardiac defibrillator) procedure 2018   Past Surgical History:  Past Surgical History:  Procedure Laterality Date  . AORTIC VALVE REPLACEMENT N/A 11/16/2016   Procedure: AORTIC VALVE REPLACEMENT (AVR);  Surgeon: Gaye Pollack, MD;  Location: [facility];  Service: Open Heart Surgery;  Laterality: N/A;  23mm Perimount Magna Ease Pericardial Bioprosthesis  . BREAST EXCISIONAL BIOPSY Left 1984   benign  . CARDIAC CATHETERIZATION N/A 07/05/2016   Procedure: Right/Left Heart Cath and Coronary Angiography;  Surgeon: Burnell Blanks, MD;  Location: [facility];  Service: Cardiovascular;  Laterality: N/A;  . CHOLECYSTECTOMY    . implantation of a Guidant biventricular ICD    . laparoscopic cholecystectomy with intraoperative cholangiogram  2003  . TEE WITHOUT CARDIOVERSION N/A 11/16/2016   Procedure: TRANSESOPHAGEAL ECHOCARDIOGRAM (TEE);  Surgeon: Gaye Pollack, MD;  Location: [facility];  Service: Open Heart Surgery;  Laterality: N/A;  . TOTAL HIP ARTHROPLASTY Left 12/31/2019   Procedure: TOTAL HIP ARTHROPLASTY ANTERIOR APPROACH;  Surgeon: Rod Can, MD;  Location: [facility];  Service: Orthopedics;  Laterality: Left;   HPI:  Per MD note "[name] is a 64 y.o. female with medical history significant of advanced dementia, Parkinson disease, hypertension, hypercalcemia, chronic diastolic CHF, presented with worsening confusion and not eating or drinking for 3 days.  Try to reach assisted living facility not available, discussed with patient's son over the phone who provides some of the information.  Also reported the patient probably stopped taking her medications for the last 3 days as well.  Patient was awake time I talked to her at bedside, denied any pain, nauseous vomit no diarrhea.  She denied any feeling of depression, but reports she just does not want to eat or drink not feeling hungry or thirsty."  Swallow evaluation ordered. Pt has had multiple recent falls - Client has had 5 falls resulting in ED visit admissions (01/20/20, 02/17/20, 02/23/20, 03/02/20 and 03/09/20). CXR showed 04/13/20 Redemonstrated small chronic cortical/subcortical right parietallobe infarct. Stable moderate generalized parenchymal atrophy andchronic small vessel ischemic disease. Redemonstrated chronic left basal ganglia lacunar infarct.3. Mild ethmoid sinus mucosal thickening.4. Left mastoid effusion.  CXR negative 04/13/20.   Assessment / Plan / Recommendation Clinical Impression  Pt presents with delayed swallow initiation presumed due to initiation deficits from Parkinson's disease (up to 20 seconds with solids and 10 seconds with liquids.)  No focal CN deviation noted but she is generally weak.  Pt without indication of aspiration with po observed *oatmeal, applesauce, Boost Breeze, juice and water.  She was able to orally  contain and "swish" a small amount of water and expectorate after SLP provided oral care with toothbrush. Informed pt to necessity of oral care, need to hold her own cup for self feeding and advise to consume softer solids/sandwiches, etc.     Recommend to maximize liquid nutrition due to pt's level of dysphagia and liquid consumption being most efficient.  Pt is able to state "it's gone" after swallowed bolus to inform feeder of readiness for more - advised her to continue this strategy.  Pt did admit that her difficulties swallowing may have contributed to her weight loss.    Will follow up briefly to assure tolerance.  Modification of diet to dys3/thin will be completed and son to bring pt's dentures this afternoon per his statement.  Using teach back, pt educated and effective compensation strategies established.  SLP Visit Diagnosis: Dysphagia, oral phase (R13.11);Dysphagia, unspecified (R13.10)    Aspiration Risk  Moderate aspiration risk    Diet Recommendation Dysphagia 3 (Mech soft);Thin liquid   Liquid Administration via: Straw Medication Administration: Whole meds with puree (crush if large) Supervision: Staff to assist with self feeding Compensations: Slow rate;Small sips/bites Postural Changes: Seated upright at 90 degrees    Other  Recommendations Oral Care Recommendations: Oral care BID   Follow up Recommendations Skilled Nursing facility      Frequency and Duration min 1 x/week  1 week       Prognosis Prognosis for Safe Diet Advancement: Good      Swallow Study   General HPI: Per MD note "[name] is a 64 y.o. female with medical history significant of advanced dementia, Parkinson disease, hypertension, hypercalcemia, chronic diastolic CHF, presented with worsening confusion and not eating or drinking for 3 days.  Try to reach assisted living facility not available, discussed with patient's son over the phone who provides some of the information.  Also reported the patient probably stopped taking her medications for the last 3 days as well.  Patient was awake time I talked to her at bedside, denied any pain, nauseous vomit no diarrhea.  She denied any feeling of depression, but reports she just  does not want to eat or drink not feeling hungry or thirsty."  Swallow evaluation ordered. Pt has had multiple recent falls - Client has had 5 falls resulting in ED visit admissions (01/20/20, 02/17/20, 02/23/20, 03/02/20 and 03/09/20). CXR showed 04/13/20 Redemonstrated small chronic cortical/subcortical right parietallobe infarct. Stable moderate generalized parenchymal atrophy andchronic small vessel ischemic disease. Redemonstrated chronic left basal ganglia lacunar infarct.3. Mild ethmoid sinus mucosal thickening.4. Left mastoid effusion.  CXR negative 04/13/20. Type of Study: Bedside Swallow Evaluation Previous Swallow Assessment: slp did not locate information in epic re: prior evaluations Diet Prior to this Study: Regular;Thin liquids Temperature Spikes Noted: No Respiratory Status: Room air History of Recent Intubation: No Behavior/Cognition: Alert;Other (Comment);Requires cueing (delayed responses) Oral Cavity Assessment: Other (comment) (minimal whitish secretions posteiror oral cavity) Patient Positioning: Upright in bed Baseline Vocal Quality: Low vocal intensity Volitional Cough: Weak Volitional Swallow: Able to elicit (delayed)    Oral/Motor/Sensory Function Overall Oral Motor/Sensory Function: Mild impairment (pt with generalized weakness, head tilts toward left)   Ice Chips Ice chips: Not tested   Thin Liquid Thin Liquid: Impaired Presentation: Straw;Spoon Pharyngeal  Phase Impairments: Suspected delayed Swallow;Wet Vocal Quality Other Comments: able to hold own cup, very delayed swallow - wet voice x1 cleared independently    Nectar Thick Nectar Thick Liquid: Not tested   Honey Thick Honey Thick Liquid:  Not tested   Puree Puree: Impaired Presentation: Spoon Pharyngeal Phase Impairments: Suspected delayed Swallow   Solid     Solid: Not tested Other Comments: Pt only eats with dentures and dentures are not in hospital; SLP called son (didn't leave message due to not identifiable  information) but he returned call and requested he bring in pt's dentures - He advised he will bring them in today after he gets off work at 1500.      Macario Golds 04/14/2020,8:59 AM   Kathleen Lime, MS [place] Office [phone]

## 2020-04-14 NOTE — Patient Outreach (Addendum)
  [facility]) Care Management Chronic Special Needs Program    04/14/2020  Name: [name], DOB: 1956-02-06  MRN: [id number]   Ms. [name] is enrolled in a chronic special needs plan for Heart Failure. Client admitted to [facility] on 04/13/20 for diagnosis of dehydration and hyponatremia.  Individualized care plan sent to [facility] utilization department for admission. Client on [facility] list ( not engaged).   PLAN; RNCM will notify hospital liaison's of clients admission RNCM will continue to follow as client's Chronic special needs program care management coordinator.   [name] RN,BSN,CCM [facility] Care Management [phone]

## 2020-04-15 DIAGNOSIS — E440 Moderate protein-calorie malnutrition: Secondary | ICD-10-CM | POA: Insufficient documentation

## 2020-04-15 DIAGNOSIS — E870 Hyperosmolality and hypernatremia: Secondary | ICD-10-CM

## 2020-04-15 DIAGNOSIS — G20 Parkinson's disease: Secondary | ICD-10-CM

## 2020-04-15 DIAGNOSIS — F0280 Dementia in other diseases classified elsewhere without behavioral disturbance: Secondary | ICD-10-CM

## 2020-04-15 LAB — BASIC METABOLIC PANEL
Anion gap: 8 (ref 5–15)
BUN: 27 mg/dL — ABNORMAL HIGH (ref 8–23)
CO2: 24 mmol/L (ref 22–32)
Calcium: 10.3 mg/dL (ref 8.9–10.3)
Chloride: 111 mmol/L (ref 98–111)
Creatinine, Ser: 0.89 mg/dL (ref 0.44–1.00)
GFR calc Af Amer: >60 mL/min (ref >60)
GFR calc non Af Amer: >60 mL/min (ref >60)
Glucose, Bld: 81 mg/dL (ref 70–99)
Potassium: 3.3 mmol/L — ABNORMAL LOW (ref 3.5–5.1)
Sodium: 143 mmol/L (ref 135–145)

## 2020-04-15 LAB — CBC WITH DIFFERENTIAL/PLATELET
Abs Immature Granulocytes: 0.03 10*3/uL (ref 0.00–0.07)
Basophils Absolute: 0 10*3/uL (ref 0.0–0.1)
Basophils Relative: 1 %
Eosinophils Absolute: 0.1 10*3/uL (ref 0.0–0.5)
Eosinophils Relative: 3 %
HCT: 40.3 % (ref 36.0–46.0)
Hemoglobin: 12 g/dL (ref 12.0–15.0)
Immature Granulocytes: 1 %
Lymphocytes Relative: 56 %
Lymphs Abs: 3.2 10*3/uL (ref 0.7–4.0)
MCH: 26 pg (ref 26.0–34.0)
MCHC: 29.8 g/dL — ABNORMAL LOW (ref 30.0–36.0)
MCV: 87.4 fL (ref 80.0–100.0)
Monocytes Absolute: 0.4 10*3/uL (ref 0.1–1.0)
Monocytes Relative: 8 %
Neutro Abs: 1.7 10*3/uL (ref 1.7–7.7)
Neutrophils Relative %: 31 %
Platelets: 104 10*3/uL — ABNORMAL LOW (ref 150–400)
RBC: 4.61 MIL/uL (ref 3.87–5.11)
RDW: 19.8 % — ABNORMAL HIGH (ref 11.5–15.5)
WBC: 5.5 10*3/uL (ref 4.0–10.5)
nRBC: 0 % (ref 0.0–0.2)

## 2020-04-15 MED ORDER — POTASSIUM CHLORIDE CRYS ER 20 MEQ PO TBCR
40.0000 meq | EXTENDED_RELEASE_TABLET | Freq: Once | ORAL | Status: AC
  Administered 2020-04-15: 40 meq via ORAL
  Filled 2020-04-15: qty 2

## 2020-04-15 MED ORDER — FUROSEMIDE 20 MG PO TABS
20.0000 mg | ORAL_TABLET | Freq: Every day | ORAL | Status: DC
  Administered 2020-04-15 – 2020-04-21 (×7): 20 mg via ORAL
  Filled 2020-04-15 (×7): qty 1

## 2020-04-15 NOTE — TOC Initial Note (Addendum)
Transition of Care [facility]) - Initial/Assessment Note    Patient Details  Name: [name] MRN: [id number] Date of Birth: [birth year]  Transition of Care [facility]) CM/SW Contact:    [name], LCSW Phone Number: 04/15/2020, 4:34 PM  Clinical Narrative:  CSW talked with patient's son [name] [phone]) regarding patient's discharge disposition and recommendation of ST rehab. When asked, son responded that he is the only child. Mr. [name] reported that his mother hs been at the ALF since February 2021 and he expressed the desire that his mother get more care than she is currently receiving. Son reported that he has applied for regular Medicaid and his mother has too much income, and also applied for Special Assistance and this was denied. CSW asked son if he had applied for LTC Medicaid and he had not, stating that he was told he needs an FL-2 and this was discussed. CSW explained the facility search process and was advised that the Medicare.gov SNF list would be placed in his mother's room.   CSW contacted [facility] Tiffany Clos, LCSW regarding patient. [name] reported that she has been following patient for awhile and understands that she needs a higher level of care, as patient has been falling a lot. CSW advised that the ALF has been trying to find another facility for patient, as they cannot manage her. [name] reported that this year patient went to [facility] for ST rehab after falling and having a hip fracture. [name] indicated that per the facility, the son has been paying the facility and his mother receives $30,276 SSA and approximately $61 pension, however the son has indicated that she is not getting the pension and she has encouraged him to look into it. Per [name], patient does not have enough income to meet the cost of care at the ALF.     FL-2 completed and facility search initiated.              Expected Discharge Plan: Skilled Nursing  Facility Barriers to Discharge: Insurance Authorization, Other (comment) (Initiating facility search)   Patient Goals and CMS Choice Patient states their goals for this hospitalization and ongoing recovery are:: Son wants patient to discharge to a SNF for ST rehab and may also need placement for LTC CMS Medicare.gov Compare Post Acute Care list provided to:: Patient Represenative (must comment) (Son [name] - placed list in patient's room for son) Choice offered to / list presented to : Adult Children  Expected Discharge Plan and Services Expected Discharge Plan: [place] In-house Referral: Clinical Social Work     Living arrangements for the past 2 months: [place] [facility])                                      Prior Living Arrangements/Services Living arrangements for the past 2 months: [place] ([facility]) Lives with:: Facility Resident ([facility]) Patient language and need for interpreter reviewed:: No Do you feel safe going back to the place where you live?: No   Son feels that his mom needs more can than the ALF is currently providing  Need for Family Participation in Patient Care: Yes (Comment) Care giver support system in place?: Yes (comment) (Patient receives care at ALF)   Criminal Activity/Legal Involvement Pertinent to Current Situation/Hospitalization: No - Comment as needed  Activities of Daily Living   ADL  Screening (condition at time of admission) Patient's cognitive ability adequate to safely complete daily activities?: No Is the patient deaf or have difficulty hearing?: No Does the patient have difficulty seeing, even when wearing glasses/contacts?: No Does the patient have difficulty concentrating, remembering, or making decisions?: Yes Patient able to express need for assistance with ADLs?: No Does the patient have difficulty dressing or bathing?: Yes Independently performs  ADLs?: No Communication: Needs assistance Is this a change from baseline?: Pre-admission baseline Dressing (OT): Dependent Is this a change from baseline?: Pre-admission baseline Grooming: Dependent Is this a change from baseline?: Pre-admission baseline Feeding: Dependent Is this a change from baseline?: Pre-admission baseline Bathing: Dependent Is this a change from baseline?: Pre-admission baseline Toileting: Dependent Is this a change from baseline?: Pre-admission baseline In/Out Bed: Dependent Is this a change from baseline?: Pre-admission baseline Walks in Home: Dependent Is this a change from baseline?: Pre-admission baseline Does the patient have difficulty walking or climbing stairs?: Yes Weakness of Legs: Both  Permission Sought/Granted Permission sought to share information with : Other (comment) (Talked with son as patient not fully oriented) Permission granted to share information with : No              Emotional Assessment Appearance:: Other (Comment Required (Did not visit with patient, talked with son by phone) Attitude/Demeanor/Rapport: Unable to Assess Affect (typically observed): Unable to Assess Orientation: : Oriented to Self Alcohol / Substance Use: Tobacco Use, Alcohol Use, Illicit Drugs (Per H&P patient smokes one cigarette per day, drinks alcohol and does not use illicit drugs) Psych Involvement: No (comment)  Admission diagnosis:  Dehydration [E86.0] Hypernatremia [E87.0] Failure to thrive in adult [R62.7] Patient Active Problem List   Diagnosis Date Noted  . Malnutrition of moderate degree 04/15/2020  . Hypercalcemia 04/14/2020  . Dehydration, severe 04/13/2020  . Dehydration 04/13/2020  . Acute hypernatremia   . Failure to thrive in adult   . Closed left hip fracture, initial encounter ([place]) 12/30/2019  . Frequent falls   . Orthostatic hypotension 12/23/2019  . Syncopal episodes 12/22/2019  . Dementia due to Parkinson's disease without  behavioral disturbance ([place]) 12/22/2019  . Renal insufficiency 12/22/2019  . Near syncope 09/04/2019  . AKI (acute kidney injury) ([place]) 09/04/2019  . Hypokalemia 09/04/2019  . Depression 09/04/2019  . Dementia ([place]) 09/04/2019  . Thrombocytopenia ([place]) 09/04/2019  . Low back pain 06/25/2019  . S/P AVR (aortic valve replacement) 12/12/2016  . Aortic stenosis 11/16/2016  . Severe aortic stenosis   . Polycythemia 06/06/2016  . Menopausal state 02/16/2016  . Hyperparathyroidism, primary ([place]) 02/16/2016  . Chronic diastolic CHF (congestive heart failure) ([place]) 05/07/2014  . Hx of colonic polyps 06/17/2013  . Ventricular tachycardia ([place]) 02/21/2011  . Automatic implantable cardioverter-defibrillator in situ 06/02/2009  . Hyperlipidemia 05/29/2009  . SMOKER 05/29/2009  . Essential hypertension 05/29/2009  . Aortic valve disorder 05/29/2009  . CARDIOMYOPATHY, SECONDARY 05/29/2009  . CHF 05/29/2009  . BICUSPID AORTIC VALVE 05/29/2009  . LIVER FUNCTION TESTS, ABNORMAL, HX OF 05/29/2009  . CHOLECYSTITIS, HX OF 05/29/2009   PCP:  Lujean Amel, MD Pharmacy:   [facility] [place] [address] Phone: [phone] Fax: [phone]     Social Determinants of Health (SDOH) Interventions  No SDOH interventions requested or needed at this time.  Readmission Risk Interventions Readmission Risk Prevention Plan 01/02/2020 12/25/2019  Transportation Screening Complete Complete  PCP or Specialist Appt within 5-7 Days Complete Complete  Home Care Screening Complete Complete  Medication Review (RN CM) Complete Complete  Some recent data might be hidden

## 2020-04-15 NOTE — Plan of Care (Signed)
  Problem: Nutrition: Goal: Adequate nutrition will be maintained Outcome: Not Progressing   

## 2020-04-15 NOTE — Progress Notes (Signed)
Initial Nutrition Assessment  DOCUMENTATION CODES:   Non-severe (moderate) malnutrition in context of chronic illness  INTERVENTION:  Magic cup TID with meals, each supplement provides 290 kcal and 9 grams of protein  Continue Ensure Enlive po TID, each supplement provides 350 kcal and 20 grams of protein  Continue MVI daily   NUTRITION DIAGNOSIS:   Moderate Malnutrition related to chronic illness (dementia) as evidenced by mild muscle depletion, mild fat depletion, moderate fat depletion, moderate muscle depletion, percent weight loss.   GOAL:   Patient will meet greater than or equal to 90% of their needs    MONITOR:   PO intake, Supplement acceptance, Weight trends, Labs, I & O's  REASON FOR ASSESSMENT:   Consult Assessment of nutrition requirement/status  ASSESSMENT:   Pt admitted with severe dehydration and hypernatremia and acute encephalopathy. PMH includes advanced dementia, Parkinson disease, HTN, hypercalcemia, CHF.  Discussed pt with RN.   Pt sleeping at time of RD visit and did not wake to RD voice or during physical exam. Per H&P, pt had not eaten for 3 days PTA.  Per wt readings, pt with potential 11% wt loss x3 months, which is significant for time frame.   PO Intake: 25-100% x 3 recorded meals (51% average meal intake)   Labs: K+ 3.3 (L) Medications: Ensure Enlive po TID, Lasix, MVI, Miralax   NUTRITION - FOCUSED PHYSICAL EXAM:    Most Recent Value  Orbital Region No depletion  Upper Arm Region Moderate depletion  Thoracic and Lumbar Region No depletion  Buccal Region Moderate depletion  Temple Region No depletion  Clavicle Bone Region Mild depletion  Clavicle and Acromion Bone Region Mild depletion  Scapular Bone Region Mild depletion  Dorsal Hand No depletion  Patellar Region Mild depletion  Anterior Thigh Region Moderate depletion  Posterior Calf Region Mild depletion  Edema (RD Assessment) None  Hair Reviewed  Eyes Reviewed  Mouth  Reviewed  Skin Reviewed  Nails Reviewed       Diet Order:   Diet Order            DIET DYS 3 Room service appropriate? Yes; Fluid consistency: Thin  Diet effective now                 EDUCATION NEEDS:   No education needs have been identified at this time  Skin:  Skin Assessment: Reviewed RN Assessment  Last BM:  unknown  Height:   Ht Readings from Last 1 Encounters:  04/13/20 5' (1.524 m)    Weight:   Wt Readings from Last 10 Encounters:  04/15/20 53.9 kg  02/23/20 60.9 kg  01/02/20 60.9 kg  12/22/19 58.1 kg  09/05/19 56.1 kg  06/25/19 58.6 kg  11/06/18 67.6 kg  07/31/18 62.1 kg  07/18/18 62.5 kg  07/08/18 60.8 kg    BMI:  Body mass index is 23.21 kg/m.  Estimated Nutritional Needs:   Kcal:  1400-1600  Protein:  70-85 grams  Fluid:  >1.4L/d    Larkin Ina, MS, RD, LDN RD pager number and weekend/on-call pager number located in [place].

## 2020-04-15 NOTE — NC FL2 (Signed)
[place] LEVEL OF CARE SCREENING TOOL     IDENTIFICATION  Patient Name: [name] Birthdate: [birth year] Sex: female Admission Date (Current Location): 04/13/2020  [facility] and [place] Number:  Herbalist and Address:  [facility], [place] [address], [place], [place] [address]      Provider Number: [id number]  Attending Physician Name and Address:  Darliss Cheney, MD  Relative Name and Phone Number:  [name] - son; [phone]    Current Level of Care: Hospital Recommended Level of Care: [place] Prior Approval Number:    Date Approved/Denied:   PASRR Number: [id number] (Eff. 01/01/20)  Discharge Plan: SNF    Current Diagnoses: Patient Active Problem List   Diagnosis Date Noted  . Malnutrition of moderate degree 04/15/2020  . Hypercalcemia 04/14/2020  . Dehydration, severe 04/13/2020  . Dehydration 04/13/2020  . Acute hypernatremia   . Failure to thrive in adult   . Closed left hip fracture, initial encounter ([place]) 12/30/2019  . Frequent falls   . Orthostatic hypotension 12/23/2019  . Syncopal episodes 12/22/2019  . Dementia due to Parkinson's disease without behavioral disturbance ([place]) 12/22/2019  . Renal insufficiency 12/22/2019  . Near syncope 09/04/2019  . AKI (acute kidney injury) ([place]) 09/04/2019  . Hypokalemia 09/04/2019  . Depression 09/04/2019  . Dementia ([place]) 09/04/2019  . Thrombocytopenia ([place]) 09/04/2019  . Low back pain 06/25/2019  . S/P AVR (aortic valve replacement) 12/12/2016  . Aortic stenosis 11/16/2016  . Severe aortic stenosis   . Polycythemia 06/06/2016  . Menopausal state 02/16/2016  . Hyperparathyroidism, primary ([place]) 02/16/2016  . Chronic diastolic CHF (congestive heart failure) ([place]) 05/07/2014  . Hx of colonic polyps 06/17/2013  . Ventricular tachycardia ([place]) 02/21/2011  . Automatic implantable cardioverter-defibrillator in situ 06/02/2009  .  Hyperlipidemia 05/29/2009  . SMOKER 05/29/2009  . Essential hypertension 05/29/2009  . Aortic valve disorder 05/29/2009  . CARDIOMYOPATHY, SECONDARY 05/29/2009  . CHF 05/29/2009  . BICUSPID AORTIC VALVE 05/29/2009  . LIVER FUNCTION TESTS, ABNORMAL, HX OF 05/29/2009  . CHOLECYSTITIS, HX OF 05/29/2009    Orientation RESPIRATION BLADDER Height & Weight     Self  Normal Incontinent Weight: 118 lb 13.3 oz (53.9 kg) Height:  5' (152.4 cm)  BEHAVIORAL SYMPTOMS/MOOD NEUROLOGICAL BOWEL NUTRITION STATUS      Continent    AMBULATORY STATUS COMMUNICATION OF NEEDS Skin   Extensive Assist (Patient was unable to ambulate with PT on 7/21 during evaluation) Verbally Normal                       Personal Care Assistance Level of Assistance  Bathing, Feeding, Dressing Bathing Assistance: Maximum assistance Feeding assistance: Limited assistance Dressing Assistance: Maximum assistance     Functional Limitations Info  Sight, Hearing, Speech Sight Info: Impaired (Uses reading glasses) Hearing Info: Adequate Speech Info: Adequate    SPECIAL CARE FACTORS FREQUENCY  PT (By licensed PT), OT (By licensed OT)     PT Frequency: PT evaluation 7/21. PT at SNF Eval and Treat, a minmum of 5 times per week OT Frequency:  (OT eval pending as of 7/22)            Contractures Contractures Info: Not present    Additional Factors Info  Code Status, Allergies Code Status Info: Full Allergies Info: Trospium Chloride ER           Current Medications (04/15/2020):  This is the current hospital active medication list Current  Facility-Administered Medications  Medication Dose Route Frequency Provider Last Rate Last Admin  . alum & mag hydroxide-simeth (MAALOX/MYLANTA) 200-200-20 MG/5ML suspension 30 mL  30 mL Oral Q6H PRN Wynetta Fines T, MD      . aspirin tablet 325 mg  325 mg Oral Daily Wynetta Fines T, MD   325 mg at 04/15/20 1004  . carbidopa-levodopa (SINEMET IR) 25-100 MG per tablet immediate  release 1 tablet  1 tablet Oral TID Hayden Rasmussen, MD   1 tablet at 04/15/20 1712  . carvedilol (COREG) tablet 12.5 mg  12.5 mg Oral BID WC Wynetta Fines T, MD   12.5 mg at 04/15/20 1712  . chlorhexidine (PERIDEX) 0.12 % solution 15 mL  15 mL Mouth Rinse BID Darliss Cheney, MD   15 mL at 04/15/20 1004  . divalproex (DEPAKOTE SPRINKLE) capsule 125 mg  125 mg Oral Daily Wynetta Fines T, MD   125 mg at 04/15/20 1005  . donepezil (ARICEPT) tablet 10 mg  10 mg Oral Daily Wynetta Fines T, MD   10 mg at 04/15/20 1004  . feeding supplement (ENSURE ENLIVE) (ENSURE ENLIVE) liquid 237 mL  237 mL Oral TID BM Darliss Cheney, MD   237 mL at 04/15/20 1604  . furosemide (LASIX) tablet 20 mg  20 mg Oral Daily Darliss Cheney, MD   20 mg at 04/15/20 1005  . guaiFENesin (ROBITUSSIN) 100 MG/5ML solution 200 mg  200 mg Oral Q6H PRN Wynetta Fines T, MD      . hydrALAZINE (APRESOLINE) injection 10 mg  10 mg Intravenous Q4H PRN Darliss Cheney, MD   10 mg at 04/15/20 1610  . irbesartan (AVAPRO) tablet 75 mg  75 mg Oral Daily Wynetta Fines T, MD   75 mg at 04/15/20 1005  . labetalol (NORMODYNE) injection 20 mg  20 mg Intravenous Once Wynetta Fines T, MD      . loperamide (IMODIUM) capsule 2 mg  2 mg Oral PRN Wynetta Fines T, MD      . LORazepam (ATIVAN) tablet 0.5 mg  0.5 mg Oral QPM Wynetta Fines T, MD   0.5 mg at 04/15/20 1712  . MEDLINE mouth rinse  15 mL Mouth Rinse q12n4p Darliss Cheney, MD   15 mL at 04/15/20 1712  . multivitamin with minerals tablet 1 tablet  1 tablet Oral Daily Lequita Halt, MD   1 tablet at 04/15/20 1004  . neomycin-bacitracin-polymyxin (NEOSPORIN) ointment 1 application  1 application Topical PRN Wynetta Fines T, MD      . polyethylene glycol (MIRALAX / GLYCOLAX) packet 17 g  17 g Oral Daily Wynetta Fines T, MD   17 g at 04/15/20 1005  . polyvinyl alcohol (LIQUIFILM TEARS) 1.4 % ophthalmic solution   Both Eyes Daily PRN Wynetta Fines T, MD      . rosuvastatin (CRESTOR) tablet 10 mg  10 mg Oral Daily Wynetta Fines T, MD    10 mg at 04/15/20 1004  . sertraline (ZOLOFT) tablet 100 mg  100 mg Oral QHS Wynetta Fines T, MD   100 mg at 04/14/20 2224  . sodium chloride flush (NS) 0.9 % injection 3 mL  3 mL Intravenous Once Hayden Rasmussen, MD         Discharge Medications: Please see discharge summary for a list of discharge medications.  Relevant Imaging Results:  Relevant Lab Results:   Additional Information ss#[id number]. Patient has had the [place] vaccinations in March 2021.  [name], LCSW

## 2020-04-15 NOTE — Progress Notes (Signed)
PROGRESS NOTE    [name]  EPP:[id number] DOB: 1955/10/06 DOA: 04/13/2020 PCP: Lujean Amel, MD   Brief Narrative:  HPI: [name] is a 64 y.o. female with medical history significant of advanced dementia, Parkinson disease, hypertension, hypercalcemia, chronic diastolic CHF, presented with worsening confusion and not eating or drinking for 3 days.  Try to reach assisted living facility not available, discussed with patient's son over the phone who provides some of the information.  Also reported the patient probably stopped taking her medications for the last 3 days as well.  Patient was awake time I talked to her at bedside, denied any pain, nauseous vomit no diarrhea.  She denied any feeling of depression, but reports she just does not want to eat or drink not feeling hungry or thirsty. ED Course: Severely hydrated, sodium 154, chloride 117, calcium 11.2.  X-ray showing chronic stroke no structural changes or bleeding.  Assessment & Plan:   Active Problems:   Essential hypertension   Dementia due to Parkinson's disease without behavioral disturbance (HCC)   Dehydration, severe   Dehydration   Acute hypernatremia   Hypercalcemia   Severe dehydration with hypernatremia: Looks better than yesterday.  Sodium improved to normal.  Not on any IV fluids.  Eating and drinking okay.  We will repeat labs tomorrow.  Acute encephalopathy: Likely secondary to dehydration and hyponatremia.  TSH normal.  Very low valproic acid level.  Now alert and intermittently oriented but does better with cramps.  Had a long discussion with patient's niece over the phone yesterday.  Her niece claims that the patient was likely not getting medications at ALF.  She is not opposed to her going to SNF.  Anorexia: No clear etiology but aging could be the main cause.  Now looks better.  Continue Ensure 3 times daily per recommendation from SLP.  Hypercalcemia: Likely due to dehydration.  Resolved.  No  more IV fluids.  Uncontrolled hypertension: Unsure whether she is noncompliant or has not been given medications at ALF.  Blood pressure now better.  Continue home medications.  Parkinson's -Resume Sinemet  DVT prophylaxis:    Code Status: Full Code  Family Communication:  None present at bedside.  Had a long discussion with patient's niece Dewaine Oats over the phone yesterday.  Status is: Inpatient  Remains inpatient appropriate because: Unsafe DC plan.  Therapy recommending SNF.  TOC working on finding out if her ALF can provide the services she requires.   Dispo: The patient is from: ALF              Anticipated d/c is to: SNF or back to ALF if they can provide the services that she requires.  TOC working on finding that out.              Anticipated d/c date is: 1 day              Patient currently is medically stable to d/c.    Estimated body mass index is 23.21 kg/m as calculated from the following:   Height as of this encounter: 5' (1.524 m).   Weight as of this encounter: 53.9 kg.      Nutritional status:  Nutrition Problem: Moderate Malnutrition Etiology: chronic illness (dementia)   Signs/Symptoms: mild muscle depletion, mild fat depletion, moderate fat depletion, moderate muscle depletion, percent weight loss Percent weight loss: 11 %   Interventions: Magic cup, Ensure Enlive (each supplement provides 350kcal and 20 grams of protein)  Consultants:   None  Procedures:   None  Antimicrobials:  Anti-infectives (From admission, onward)   None         Subjective: Patient seen and examined.  She was alert but partly oriented but did better with little prompts.  She had no complaint.  Objective: Vitals:   04/14/20 2101 04/15/20 0457 04/15/20 0621 04/15/20 0946  BP: (!) 148/97 (!) 125/107 (!) 173/100 (!) 135/92  Pulse: (!) 55 (!) 59 (!) 55 75  Resp: 18 17  18   Temp: 97.8 F (36.6 C) 97.8 F (36.6 C)  (!) 97.4 F (36.3 C)  TempSrc:    Oral    SpO2: 98% 99%  98%  Weight:  53.9 kg    Height:        Intake/Output Summary (Last 24 hours) at 04/15/2020 1337 Last data filed at 04/15/2020 1300 Gross per 24 hour  Intake 940 ml  Output 400 ml  Net 540 ml   Filed Weights   04/13/20 1358 04/14/20 0135 04/15/20 0457  Weight: 60 kg 50.4 kg 53.9 kg    Examination:  General exam: Appears calm and comfortable but weak Respiratory system: Clear to auscultation. Respiratory effort normal. Cardiovascular system: S1 & S2 heard, RRR. No JVD, murmurs, rubs, gallops or clicks. No pedal edema. Gastrointestinal system: Abdomen is nondistended, soft and nontender. No organomegaly or masses felt. Normal bowel sounds heard. Central nervous system: Alert and oriented x2. No focal neurological deficits. Extremities: Symmetric 5 x 5 power. Skin: No rashes, lesions or ulcers.   Data Reviewed: I have personally reviewed following labs and imaging studies  CBC: Recent Labs  Lab 04/13/20 1428 04/14/20 0627 04/15/20 0304  WBC 5.8 4.3 5.5  NEUTROABS  --   --  1.7  HGB 14.0 12.3 12.0  HCT 49.2* 41.7 40.3  MCV 90.6 89.1 87.4  PLT 140* 107* 374*   Basic Metabolic Panel: Recent Labs  Lab 04/13/20 1428 04/14/20 0627 04/15/20 0304  NA 154* 151* 143  K 3.7 3.5 3.3*  CL 117* 119* 111  CO2 24 24 24   GLUCOSE 109* 130* 81  BUN 36* 31* 27*  CREATININE 1.24* 0.89 0.89  CALCIUM 11.2* 10.4* 10.3   GFR: Estimated Creatinine Clearance: 44.7 mL/min (by C-G formula based on SCr of 0.89 mg/dL). Liver Function Tests: Recent Labs  Lab 04/13/20 1428  AST 33  ALT 30  ALKPHOS 99  BILITOT 1.0  PROT 8.8*  ALBUMIN 3.8   No results for input(s): LIPASE, AMYLASE in the last 168 hours. Recent Labs  Lab 04/13/20 1651  AMMONIA 36*   Coagulation Profile: No results for input(s): INR, PROTIME in the last 168 hours. Cardiac Enzymes: No results for input(s): CKTOTAL, CKMB, CKMBINDEX, TROPONINI in the last 168 hours. BNP (last 3 results) No results  for input(s): PROBNP in the last 8760 hours. HbA1C: No results for input(s): HGBA1C in the last 72 hours. CBG: Recent Labs  Lab 04/13/20 1410  GLUCAP 86   Lipid Profile: No results for input(s): CHOL, HDL, LDLCALC, TRIG, CHOLHDL, LDLDIRECT in the last 72 hours. Thyroid Function Tests: Recent Labs    04/14/20 0627  TSH 2.010   Anemia Panel: No results for input(s): VITAMINB12, FOLATE, FERRITIN, TIBC, IRON, RETICCTPCT in the last 72 hours. Sepsis Labs: No results for input(s): PROCALCITON, LATICACIDVEN in the last 168 hours.  Recent Results (from the past 240 hour(s))  SARS Coronavirus 2 by RT PCR (hospital order, performed in [facility] hospital lab) Nasopharyngeal Nasopharyngeal Swab  Status: None   Collection Time: 04/13/20  6:04 PM   Specimen: Nasopharyngeal Swab  Result Value Ref Range Status   SARS Coronavirus 2 NEGATIVE NEGATIVE Final    Comment: (NOTE) SARS-CoV-2 target nucleic acids are NOT DETECTED.  The SARS-CoV-2 RNA is generally detectable in upper and lower respiratory specimens during the acute phase of infection. The lowest concentration of SARS-CoV-2 viral copies this assay can detect is 250 copies / mL. A negative result does not preclude SARS-CoV-2 infection and should not be used as the sole basis for treatment or other patient management decisions.  A negative result may occur with improper specimen collection / handling, submission of specimen other than nasopharyngeal swab, presence of viral mutation(s) within the areas targeted by this assay, and inadequate number of viral copies (<250 copies / mL). A negative result must be combined with clinical observations, patient history, and epidemiological information.  Fact Sheet for Patients:   StrictlyIdeas.no  Fact Sheet for Healthcare Providers: BankingDealers.co.za  This test is not yet approved or  cleared by the [place] FDA and has been  authorized for detection and/or diagnosis of SARS-CoV-2 by FDA under an Emergency Use Authorization (EUA).  This EUA will remain in effect (meaning this test can be used) for the duration of the COVID-19 declaration under Section 564(b)(1) of the Act, 21 U.S.C. section 360bbb-3(b)(1), unless the authorization is terminated or revoked sooner.  Performed at [facility], [place] [address] [place], [place] [address]   MRSA PCR Screening     Status: None   Collection Time: 04/14/20  1:56 PM   Specimen: Nasopharyngeal  Result Value Ref Range Status   MRSA by PCR NEGATIVE NEGATIVE Final    Comment:        The GeneXpert MRSA Assay (FDA approved for NASAL specimens only), is one component of a comprehensive MRSA colonization surveillance program. It is not intended to diagnose MRSA infection nor to guide or monitor treatment for MRSA infections. Performed at [facility], [place] [address], [place], [place] [address]       Radiology Studies: CT Head Wo Contrast  Result Date: 04/13/2020 CLINICAL DATA:  Encephalopathy. Additional provided: Hypertension, failure to thrive, fatigue. Osteoarthritis, cervical. EXAM: CT HEAD WITHOUT CONTRAST CT CERVICAL SPINE WITHOUT CONTRAST TECHNIQUE: Multidetector CT imaging of the head and cervical spine was performed following the standard protocol without intravenous contrast. Multiplanar CT image reconstructions of the cervical spine were also generated. COMPARISON:  Head CT 02/23/2020, CT cervical spine 12/29/2019. FINDINGS: CT HEAD FINDINGS Brain: Stable, moderate generalized parenchymal atrophy which is advanced for age. Redemonstrated small chronic cortical/subcortical infarct within the right parietal lobe (series 4, image 23) Stable, moderate ill-defined hypoattenuation within the cerebral white matter which is nonspecific, but consistent with chronic small vessel ischemic disease redemonstrated chronic lacunar infarct within the left caudate  head. There is no acute intracranial hemorrhage. No acute demarcated cortical infarct is identified. No extra-axial fluid collection. No evidence of intracranial mass. No midline shift. Vascular: No hyperdense vessel.  Atherosclerotic calcifications Skull: Normal. Negative for fracture or focal lesion. Sinuses/Orbits: Visualized orbits show no acute finding. Mild ethmoid sinus mucosal thickening. Left mastoid effusion. CT CERVICAL SPINE FINDINGS Alignment: Cervical dextrocurvature. Straightening of the expected cervical lordosis. Trace C6-C7 and C7-T1 grade 1 anterolisthesis. Skull base and vertebrae: The basion-dental and atlanto-dental intervals are maintained.No evidence of acute fracture to the cervical spine. Soft tissues and spinal canal: No prevertebral fluid or swelling. No visible canal hematoma. Disc levels:  Mild for age cervical spondylosis. There is mild-to-moderate multilevel disc space narrowing. Small C2-C3 right center disc protrusion. Shallow C3-C4 disc bulge. Shallow C4-C5 disc bulge. Shallow C5-C6 disc bulge. Shallow C6-C7 disc bulge. No more than mild spinal canal stenosis at any level. Multilevel uncovertebral hypertrophy. Minimal multilevel facet arthrosis. Mild/moderate C3-C4 right neural foraminal narrowing. No more than mild bony neural foraminal narrowing at the remaining levels. Upper chest: No consolidation within the imaged lung apices. No visible pneumothorax. Other: Calcified atherosclerotic plaque within the visualized proximal major branch vessels of the neck and within the carotid arteries. IMPRESSION: CT head: 1. No evidence of acute intracranial abnormality. 2. Redemonstrated small chronic cortical/subcortical right parietal lobe infarct. Stable moderate generalized parenchymal atrophy and chronic small vessel ischemic disease. Redemonstrated chronic left basal ganglia lacunar infarct. 3. Mild ethmoid sinus mucosal thickening. 4. Left mastoid effusion CT cervical spine: 1. No  evidence of acute fracture to the cervical spine 2. Cervical dextrocurvature. 3. Minimal C6-C7 and C7-T1 grade 1 anterolisthesis. 4. Cervical spondylosis as outlined. No more than mild appreciable spinal canal stenosis at any level. Multilevel neural foraminal narrowing, greatest on the right at C3-C4 (mild/moderate in severity at this site). Electronically Signed   By: Kellie Simmering DO   On: 04/13/2020 17:41   CT Cervical Spine Wo Contrast  Result Date: 04/13/2020 CLINICAL DATA:  Encephalopathy. Additional provided: Hypertension, failure to thrive, fatigue. Osteoarthritis, cervical. EXAM: CT HEAD WITHOUT CONTRAST CT CERVICAL SPINE WITHOUT CONTRAST TECHNIQUE: Multidetector CT imaging of the head and cervical spine was performed following the standard protocol without intravenous contrast. Multiplanar CT image reconstructions of the cervical spine were also generated. COMPARISON:  Head CT 02/23/2020, CT cervical spine 12/29/2019. FINDINGS: CT HEAD FINDINGS Brain: Stable, moderate generalized parenchymal atrophy which is advanced for age. Redemonstrated small chronic cortical/subcortical infarct within the right parietal lobe (series 4, image 23) Stable, moderate ill-defined hypoattenuation within the cerebral white matter which is nonspecific, but consistent with chronic small vessel ischemic disease redemonstrated chronic lacunar infarct within the left caudate head. There is no acute intracranial hemorrhage. No acute demarcated cortical infarct is identified. No extra-axial fluid collection. No evidence of intracranial mass. No midline shift. Vascular: No hyperdense vessel.  Atherosclerotic calcifications Skull: Normal. Negative for fracture or focal lesion. Sinuses/Orbits: Visualized orbits show no acute finding. Mild ethmoid sinus mucosal thickening. Left mastoid effusion. CT CERVICAL SPINE FINDINGS Alignment: Cervical dextrocurvature. Straightening of the expected cervical lordosis. Trace C6-C7 and C7-T1  grade 1 anterolisthesis. Skull base and vertebrae: The basion-dental and atlanto-dental intervals are maintained.No evidence of acute fracture to the cervical spine. Soft tissues and spinal canal: No prevertebral fluid or swelling. No visible canal hematoma. Disc levels: Mild for age cervical spondylosis. There is mild-to-moderate multilevel disc space narrowing. Small C2-C3 right center disc protrusion. Shallow C3-C4 disc bulge. Shallow C4-C5 disc bulge. Shallow C5-C6 disc bulge. Shallow C6-C7 disc bulge. No more than mild spinal canal stenosis at any level. Multilevel uncovertebral hypertrophy. Minimal multilevel facet arthrosis. Mild/moderate C3-C4 right neural foraminal narrowing. No more than mild bony neural foraminal narrowing at the remaining levels. Upper chest: No consolidation within the imaged lung apices. No visible pneumothorax. Other: Calcified atherosclerotic plaque within the visualized proximal major branch vessels of the neck and within the carotid arteries. IMPRESSION: CT head: 1. No evidence of acute intracranial abnormality. 2. Redemonstrated small chronic cortical/subcortical right parietal lobe infarct. Stable moderate generalized parenchymal atrophy and chronic small vessel ischemic disease. Redemonstrated chronic left basal ganglia lacunar infarct. 3. Mild ethmoid  sinus mucosal thickening. 4. Left mastoid effusion CT cervical spine: 1. No evidence of acute fracture to the cervical spine 2. Cervical dextrocurvature. 3. Minimal C6-C7 and C7-T1 grade 1 anterolisthesis. 4. Cervical spondylosis as outlined. No more than mild appreciable spinal canal stenosis at any level. Multilevel neural foraminal narrowing, greatest on the right at C3-C4 (mild/moderate in severity at this site). Electronically Signed   By: Kellie Simmering DO   On: 04/13/2020 17:41   DG Chest Port 1 View  Result Date: 04/13/2020 CLINICAL DATA:  Weakness failure to thrive EXAM: PORTABLE CHEST 1 VIEW COMPARISON:  12/29/2019  FINDINGS: Post sternotomy changes and valve prosthesis. Left-sided pacing device as before. No focal airspace disease or effusion. Normal cardiomediastinal silhouette with aortic atherosclerosis. No pneumothorax. IMPRESSION: No active disease. Electronically Signed   By: Donavan Foil M.D.   On: 04/13/2020 15:45    Scheduled Meds: . aspirin  325 mg Oral Daily  . carbidopa-levodopa  1 tablet Oral TID  . carvedilol  12.5 mg Oral BID WC  . chlorhexidine  15 mL Mouth Rinse BID  . divalproex  125 mg Oral Daily  . donepezil  10 mg Oral Daily  . feeding supplement (ENSURE ENLIVE)  237 mL Oral TID BM  . furosemide  20 mg Oral Daily  . irbesartan  75 mg Oral Daily  . labetalol  20 mg Intravenous Once  . LORazepam  0.5 mg Oral QPM  . mouth rinse  15 mL Mouth Rinse q12n4p  . multivitamin with minerals  1 tablet Oral Daily  . polyethylene glycol  17 g Oral Daily  . rosuvastatin  10 mg Oral Daily  . sertraline  100 mg Oral QHS  . sodium chloride flush  3 mL Intravenous Once   Continuous Infusions:    LOS: 2 days   Time spent: 30 minutes   Darliss Cheney, MD [facility]  04/15/2020, 1:37 PM   To contact the attending provider between 7A-7P or the covering provider during after hours 7P-7A, please log into the web site [email or web address].

## 2020-04-15 NOTE — Progress Notes (Signed)
  Speech Language Pathology Treatment: Dysphagia  Patient Details Name: [name] MRN: [id number] DOB: May 08, 1956 Today's Date: 04/15/2020 Time: 3149-7026 SLP Time Calculation (min) (ACUTE ONLY): 18 min  Assessment / Plan / Recommendation Clinical Impression  Pt found sitting partially upright sucking on a banana without dentures. Dentures placed and pt appropriate bit and masticated banana at a slow but adequate rate. She reported no interest in the pancakes or sausage on her tray, but enthusiastically ate her banana without assistance. Appetite and interest in PO improving. Pt cannot eat without her dentures and we discussed that pt needs to request them if they re not present (signs are already posted above head of bed). Pt should continue current diet with set up assist. There is no further role for therapeutic intervention as pt is consuming and tolerating meals as long as appropriate staff support is giv en. Will sign off at this time.  HPI HPI: Per MD note "[name] is a 64 y.o. female with medical history significant of advanced dementia, Parkinson disease, hypertension, hypercalcemia, chronic diastolic CHF, presented with worsening confusion and not eating or drinking for 3 days.  Try to reach assisted living facility not available, discussed with patient's son over the phone who provides some of the information.  Also reported the patient probably stopped taking her medications for the last 3 days as well.  Patient was awake time I talked to her at bedside, denied any pain, nauseous vomit no diarrhea.  She denied any feeling of depression, but reports she just does not want to eat or drink not feeling hungry or thirsty."  Swallow evaluation ordered. Pt has had multiple recent falls - Client has had 5 falls resulting in ED visit admissions (01/20/20, 02/17/20, 02/23/20, 03/02/20 and 03/09/20). CXR showed 04/13/20 Redemonstrated small chronic cortical/subcortical right parietallobe infarct.  Stable moderate generalized parenchymal atrophy andchronic small vessel ischemic disease. Redemonstrated chronic left basal ganglia lacunar infarct.3. Mild ethmoid sinus mucosal thickening.4. Left mastoid effusion.  CXR negative 04/13/20.      SLP Plan  Continue with current plan of care       Recommendations  Diet recommendations: Dysphagia 3 (mechanical soft);Thin liquid Medication Administration: Whole meds with puree Supervision: Patient able to self feed Compensations: Slow rate;Small sips/bites                Oral Care Recommendations: Oral care BID Follow up Recommendations: Skilled Nursing facility SLP Visit Diagnosis: Dysphagia, oral phase (R13.11) Plan: Continue with current plan of care       GO               Herbie Baltimore, MA [place] Pager [phone] Office [phone]  Lynann Beaver 04/15/2020, 9:13 AM

## 2020-04-16 DIAGNOSIS — I10 Essential (primary) hypertension: Secondary | ICD-10-CM

## 2020-04-16 LAB — CBC WITH DIFFERENTIAL/PLATELET
Abs Immature Granulocytes: 0.02 10*3/uL (ref 0.00–0.07)
Basophils Absolute: 0 10*3/uL (ref 0.0–0.1)
Basophils Relative: 1 %
Eosinophils Absolute: 0.1 10*3/uL (ref 0.0–0.5)
Eosinophils Relative: 2 %
HCT: 40 % (ref 36.0–46.0)
Hemoglobin: 12.5 g/dL (ref 12.0–15.0)
Immature Granulocytes: 0 %
Lymphocytes Relative: 35 %
Lymphs Abs: 1.8 10*3/uL (ref 0.7–4.0)
MCH: 26.8 pg (ref 26.0–34.0)
MCHC: 31.3 g/dL (ref 30.0–36.0)
MCV: 85.7 fL (ref 80.0–100.0)
Monocytes Absolute: 0.5 10*3/uL (ref 0.1–1.0)
Monocytes Relative: 10 %
Neutro Abs: 2.6 10*3/uL (ref 1.7–7.7)
Neutrophils Relative %: 52 %
Platelets: 114 10*3/uL — ABNORMAL LOW (ref 150–400)
RBC: 4.67 MIL/uL (ref 3.87–5.11)
RDW: 19.5 % — ABNORMAL HIGH (ref 11.5–15.5)
WBC: 5.1 10*3/uL (ref 4.0–10.5)
nRBC: 0 % (ref 0.0–0.2)

## 2020-04-16 LAB — BASIC METABOLIC PANEL
Anion gap: 7 (ref 5–15)
BUN: 21 mg/dL (ref 8–23)
CO2: 25 mmol/L (ref 22–32)
Calcium: 10.4 mg/dL — ABNORMAL HIGH (ref 8.9–10.3)
Chloride: 108 mmol/L (ref 98–111)
Creatinine, Ser: 0.78 mg/dL (ref 0.44–1.00)
GFR calc Af Amer: >60 mL/min (ref >60)
GFR calc non Af Amer: >60 mL/min (ref >60)
Glucose, Bld: 88 mg/dL (ref 70–99)
Potassium: 3.9 mmol/L (ref 3.5–5.1)
Sodium: 140 mmol/L (ref 135–145)

## 2020-04-16 NOTE — Progress Notes (Signed)
SW recommended to do the SARS test tomorrow as the result will be ok for 72 hrs for placement.

## 2020-04-16 NOTE — Evaluation (Signed)
Occupational Therapy Evaluation Patient Details Name: [name] MRN: [id number] DOB: 02-Jan-1956 Today's Date: 04/16/2020    History of Present Illness SHARETTA RICCHIO is a 64 y.o. female with medical history significant of advanced dementia, Parkinson disease, hypertension, hypercalcemia, chronic diastolic CHF, presented with worsening confusion and not eating or drinking for 3 days.   Clinical Impression   PTA, pt resides at an ALF and received staff assistance for RW use and ADLs. Pt presents now with severe deficits in strength, endurance, balance with heavy kyphosis of cervical spine. Pt also with cognitive deficits resulting in slow processing and delayed motor movements requiring multimodal cues. Pt requires Total A for bed mobility, but able to demonstrate unsupported static sitting balance EOB for 3 minutes. Pt Total A for transfers. Pt requires Min A to Max A for UB ADLs and Total A for LB ADLs. Pt would benefit from further activity tolerance building and maximizing confidence by increasing independence with simple ADLs.    Follow Up Recommendations  SNF;Supervision/Assistance - 24 hour    Equipment Recommendations  Other (comment) (TBD)    Recommendations for Other Services       Precautions / Restrictions Precautions Precautions: Fall Restrictions Weight Bearing Restrictions: No      Mobility Bed Mobility Overal bed mobility: Needs Assistance Bed Mobility: Supine to Sit     Supine to sit: Total assist;HOB elevated     General bed mobility comments: Total A for bed mobility, but able to hold to bedrail with [facility] placement  Transfers Overall transfer level: Needs assistance Equipment used: None Transfers: Stand Pivot Transfers;Sit to/from Stand Sit to Stand: Total assist Stand pivot transfers: Total assist       General transfer comment: Total A for sit to stand and stand pivot with knees blocked. Pt with heavy posterior lean , requiring extensive assist  to maintain standing balance    Balance Overall balance assessment: Needs assistance Sitting-balance support: Feet supported;Single extremity supported Sitting balance-Leahy Scale: Fair Sitting balance - Comments: sitting balance fair statically with L hand on bedrail Postural control: Posterior lean   Standing balance-Leahy Scale: Zero Standing balance comment: Heavy posterior lean with Max A to maintain standing                            ADL either performed or assessed with clinical judgement   ADL Overall ADL's : Needs assistance/impaired Eating/Feeding: Minimal assistance;Sitting   Grooming: Maximal assistance;Sitting Grooming Details (indicate cue type and reason): Max A for denture care and washing face. Slow movements Upper Body Bathing: Maximal assistance;Sitting   Lower Body Bathing: Total assistance;Bed level   Upper Body Dressing : Maximal assistance;Sitting   Lower Body Dressing: Total assistance;Bed level       Toileting- Clothing Manipulation and Hygiene: Total assistance;Bed level         General ADL Comments: Pt with severe deficits in strength, balance, endurance with extreme kyphosis. Pt with cognitive deficits with slow processing and motor planning     Vision Baseline Vision/History: Wears glasses Wears Glasses: At all times Patient Visual Report: No change from baseline       Perception     Praxis      Pertinent Vitals/Pain Pain Assessment: Faces Faces Pain Scale: No hurt     Hand Dominance Right   Extremity/Trunk Assessment Upper Extremity Assessment Upper Extremity Assessment: Generalized weakness   Lower Extremity Assessment Lower Extremity Assessment: Defer to PT evaluation  Cervical / Trunk Assessment Cervical / Trunk Assessment: Kyphotic (heavy kyphosis of cervical spine)   Communication Communication Communication: No difficulties   Cognition Arousal/Alertness: Lethargic Behavior During Therapy: Flat  affect Overall Cognitive Status: History of cognitive impairments - at baseline Area of Impairment: Orientation;Memory;Safety/judgement;Awareness;Problem solving                 Orientation Level: Place;Time;Situation   Memory: Decreased short-term memory   Safety/Judgement: Decreased awareness of deficits Awareness: Intellectual Problem Solving: Slow processing;Decreased initiation;Difficulty sequencing;Requires verbal cues;Requires tactile cues General Comments: Pt with slow processing and slow motor planning. benefits from multimodal cues. Pt pleasant and soft spoken   General Comments  Pt HR WFL during session. Pt with flat affect, responsive to questions but slow moving     Exercises     Shoulder Instructions      Home Living Family/patient expects to be discharged to:: Assisted living                             Home Equipment: Gilford Rile - 2 wheels   Additional Comments: Pt reports using RW at ALF, assistance with ADLs      Prior Functioning/Environment Level of Independence: Needs assistance  Gait / Transfers Assistance Needed: walks with RW; last admission she reported she has staff assist to ambulate ADL's / Homemaking Assistance Needed: Staff has to assist with bathing/dressing            OT Problem List: Decreased strength;Decreased activity tolerance;Impaired balance (sitting and/or standing);Decreased coordination;Decreased cognition;Decreased safety awareness;Decreased knowledge of use of DME or AE      OT Treatment/Interventions: Self-care/ADL training;Therapeutic exercise;DME and/or AE instruction;Energy conservation;Therapeutic activities;Patient/family education    OT Goals(Current goals can be found in the care plan section) Acute Rehab OT Goals Patient Stated Goal: did not state, but agreeable to OOB OT Goal Formulation: With patient Time For Goal Achievement: 04/30/20 Potential to Achieve Goals: Fair ADL Goals Pt Will Perform  Eating: with set-up;sitting Pt Will Perform Grooming: with min assist;sitting Pt Will Perform Upper Body Bathing: with mod assist;sitting Additional ADL Goal #1: Pt to demonstrate ability to sit EOB > 5 minutes with no more than Supervision required during ADL tasks to improve core strength Additional ADL Goal #2: Pt to demonstrate active cervical ROM HEP with no more than min verbal/tactile cues to improve posture and comfort  OT Frequency: Min 2X/week   Barriers to D/C:            Co-evaluation              AM-PAC OT "6 Clicks" Daily Activity     Outcome Measure Help from another person eating meals?: A Little Help from another person taking care of personal grooming?: A Lot Help from another person toileting, which includes using toliet, bedpan, or urinal?: Total Help from another person bathing (including washing, rinsing, drying)?: Total Help from another person to put on and taking off regular upper body clothing?: A Lot Help from another person to put on and taking off regular lower body clothing?: Total 6 Click Score: 10   End of Session Equipment Utilized During Treatment: Gait belt Nurse Communication: Mobility status  Activity Tolerance: Patient limited by fatigue Patient left: in chair;with call bell/phone within reach;with chair alarm set  OT Visit Diagnosis: Unsteadiness on feet (R26.81);Other abnormalities of gait and mobility (R26.89);Muscle weakness (generalized) (M62.81);Other symptoms and signs involving cognitive function  Time: 7614-7092 OT Time Calculation (min): 30 min Charges:  OT General Charges $OT Visit: 1 Visit OT Evaluation $OT Eval Moderate Complexity: 1 Mod OT Treatments $Therapeutic Activity: 8-22 mins  Layla Maw, OTR/L  Layla Maw 04/16/2020, 10:22 AM

## 2020-04-16 NOTE — TOC Progression Note (Addendum)
Transition of Care [facility]) - Progression Note    Patient Details  Name: [name] MRN: [id number] Date of Birth: [birth year]  [facility] CM/SW Contact  Sharlet Salina Mila Homer, LCSW Phone Number: 04/16/2020, 12:13 PM  Clinical Narrative:  Was provided with facility responses and chose [facility]. Contact made with Tammy, admissions director regarding patient and she will make contact with son regarding admissions paperwork. Call made to [facility] and message left for Tammy regarding patient. Faxed insurance auth request form to [employer].     Talked with Tammy with [employer] regarding patient and initiated insurance authorization request. Also made request for ambulance transport. Tammy was given the weekend SW's name and contact information.     Expected Discharge Plan: Skilled Nursing Facility Barriers to Discharge: Ship broker, Other (comment) (Initiating facility search)  Expected Discharge Plan and Services Expected Discharge Plan: [place] In-house Referral: Clinical Social Work     Living arrangements for the past 2 months: [place] ([facility])                                       Social Determinants of Health (SDOH) Interventions    Readmission Risk Interventions Readmission Risk Prevention Plan 01/02/2020 12/25/2019  Transportation Screening Complete Complete  PCP or Specialist Appt within 5-7 Days Complete Complete  Home Care Screening Complete Complete  Medication Review (RN CM) Complete Complete  Some recent data might be hidden

## 2020-04-16 NOTE — Progress Notes (Signed)
Physical Therapy Treatment Patient Details Name: [name] MRN: [id number] DOB: June 23, 1956 Today's Date: 04/16/2020    History of Present Illness [name] is a 64 y.o. female with medical history significant of advanced dementia, Parkinson disease, hypertension, hypercalcemia, chronic diastolic CHF, presented with worsening confusion and not eating or drinking x3 days.    PT Comments    Pt initially asleep and very lethargic in recliner chair upon arrival. Able to be minimally aroused to attempt to participate in therapy session. Focus of session was on sitting balance and improved postural alignment. Unable to achieve an appropriate sitting position in recliner chair to attempt a safe transfer as pt with significant posterior lean throughout, despite max multimodal cueing and heavy physical assistance. Pt would continue to benefit from skilled physical therapy services at this time while admitted and after d/c to address the below listed limitations in order to improve overall safety and independence with functional mobility.     Follow Up Recommendations  SNF     Equipment Recommendations  Hospital bed;Other (comment) Optician, dispensing)    Recommendations for Other Services       Precautions / Restrictions Precautions Precautions: Fall Restrictions Weight Bearing Restrictions: No    Mobility  Bed Mobility Overal bed mobility: Needs Assistance Bed Mobility: Supine to Sit     Supine to sit: Total assist;HOB elevated     General bed mobility comments: pt OOB in recliner chair upon arrival  Transfers Overall transfer level: Needs assistance Equipment used: None Transfers: Stand Pivot Transfers;Sit to/from Stand Sit to Stand: Total assist Stand pivot transfers: Total assist       General transfer comment: focus of session was on sitting balance and posture as pt with heavy significant posterior lean and could not achieve a safe position to attempt standing from  recliner chair  Ambulation/Gait                 Stairs             Wheelchair Mobility    Modified Rankin (Stroke Patients Only)       Balance Overall balance assessment: Needs assistance Sitting-balance support: Feet supported;Feet unsupported;Bilateral upper extremity supported Sitting balance-Leahy Scale: Poor Sitting balance - Comments: sitting balance fair statically with L hand on bedrail Postural control: Posterior lean   Standing balance-Leahy Scale: Zero Standing balance comment: Heavy posterior lean with Max A to maintain standing                             Cognition Arousal/Alertness: Lethargic Behavior During Therapy: Flat affect Overall Cognitive Status: Impaired/Different from baseline Area of Impairment: Memory;Following commands;Safety/judgement;Awareness;Problem solving                 Orientation Level: Place;Time;Situation   Memory: Decreased short-term memory Following Commands: Follows one step commands with increased time Safety/Judgement: Decreased awareness of safety;Decreased awareness of deficits Awareness: Intellectual Problem Solving: Slow processing;Decreased initiation;Difficulty sequencing;Requires verbal cues;Requires tactile cues General Comments: Pt with slow processing and slow motor planning. benefits from multimodal cues. Pt pleasant and soft spoken      Exercises Other Exercises Other Exercises: postural work with pt sitting in recliner chair without back support for thoracic and cerivcal extension with shoulder abduction and external rotation for pec stretch Other Exercises: sitting balance activity with working towards a more midline, upright posture with pt reaching forwards and anterior weight shift (required max-total A to complete) Other Exercises: anterior-posterior  weight shifting in sitting for balance work as well as for trunk mobility    General Comments General comments (skin integrity,  edema, etc.): Pt HR WFL during session. Pt with flat affect, responsive to questions but slow moving       Pertinent Vitals/Pain Pain Assessment: No/denies pain Faces Pain Scale: No hurt    Home Living Family/patient expects to be discharged to:: Assisted living             Home Equipment: Walker - 2 wheels Additional Comments: Pt reports using RW at ALF, assistance with ADLs    Prior Function Level of Independence: Needs assistance  Gait / Transfers Assistance Needed: walks with RW; last admission she reported she has staff assist to ambulate ADL's / Homemaking Assistance Needed: Staff has to assist with bathing/dressing     PT Goals (current goals can now be found in the care plan section) Acute Rehab PT Goals Patient Stated Goal: did not state, but agreeable to OOB PT Goal Formulation: Patient unable to participate in goal setting Time For Goal Achievement: 04/28/20 Potential to Achieve Goals: Fair Progress towards PT goals: Progressing toward goals    Frequency    Min 3X/week      PT Plan Current plan remains appropriate    Co-evaluation              AM-PAC PT "6 Clicks" Mobility   Outcome Measure  Help needed turning from your back to your side while in a flat bed without using bedrails?: A Lot Help needed moving from lying on your back to sitting on the side of a flat bed without using bedrails?: A Lot Help needed moving to and from a bed to a chair (including a wheelchair)?: Total Help needed standing up from a chair using your arms (e.g., wheelchair or bedside chair)?: Total Help needed to walk in hospital room?: Total Help needed climbing 3-5 steps with a railing? : Total 6 Click Score: 8    End of Session   Activity Tolerance: Patient limited by fatigue;Patient limited by lethargy Patient left: in chair;with call bell/phone within reach;with chair alarm set Nurse Communication: Mobility status PT Visit Diagnosis: Unsteadiness on feet  (R26.81);Other abnormalities of gait and mobility (R26.89);Muscle weakness (generalized) (M62.81);History of falling (Z91.81)     Time: 1030-1045 PT Time Calculation (min) (ACUTE ONLY): 15 min  Charges:  $Neuromuscular Re-education: 8-22 mins                     Anastasio Champion, DPT  Acute Rehabilitation Services Pager [phone] Office [place] 04/16/2020, 12:53 PM

## 2020-04-16 NOTE — Progress Notes (Addendum)
PROGRESS NOTE    [name]  SHF:[id number] DOB: May 03, 1956 DOA: 04/13/2020 PCP: Lujean Amel, MD   Brief Narrative:  [name] is a 64 y.o. female with medical history significant of advanced dementia, Parkinson disease, hypertension, hypercalcemia, chronic diastolic CHF, presented with worsening confusion and not eating or drinking for 3 days.  Reportedly, patient probably stopped taking her medications for the last 3 days as well. She denied any feeling of depression or any other symptoms, but reports she just does not want to eat or drink not feeling hungry or thirsty.  Upon arrival to ED, she was Severely hydrated, sodium 154, chloride 117, calcium 11.2.    CT head showed chronic cortical right parietal lobe infarct.  No new acute pathology.  CT cervical spine was also unremarkable.  Patient was admitted with the diagnosis of hypovolemic hyponatremia and hypercalcemia and dehydration and altered mental status/acute encephalopathy secondary to dehydration.  Patient was started on IV fluids, her electrolytes abnormalities resolved.  Patient became more alert and back to her baseline however she remains intermittently confused due to her baseline dementia.  Seen by PT OT who recommended discharging to SNF.  Assessment & Plan:   Active Problems:   Essential hypertension   Dementia due to Parkinson's disease without behavioral disturbance (HCC)   Dehydration, severe   Dehydration   Acute hypernatremia   Hypercalcemia   Malnutrition of moderate degree   Severe dehydration with hypernatremia: Looks better than yesterday.  Sodium improved to normal. Eating and drinking okay.    Acute encephalopathy: Likely secondary to dehydration and hyponatremia.  TSH normal.  Very low valproic acid level.  Now alert and intermittently oriented. Had a long discussion with patient's niece over the phone day before yesterday.  She verified that her mental status is likely at her baseline however  also stated that she is declining.  Her niece claims that the patient was likely not getting medications at ALF.  She is not opposed to her going to SNF.  Anorexia: No clear etiology but aging could be the main cause.  Now looks better.  Continue Ensure 3 times daily per recommendation from SLP.  Hypercalcemia: Likely due to dehydration.  Resolved.  No more IV fluids.  Uncontrolled hypertension: Unsure whether she is noncompliant or has not been given medications at ALF.  Blood pressure now better.  Continue home medications.  Parkinson's -Resume Sinemet  DVT prophylaxis:    Code Status: Full Code  Family Communication:  None present at bedside.  Had a long discussion with patient's niece Dewaine Oats over the phone day before yesterday and called her son [name] and left him a voicemail and he called me back few minutes later.  I informed him that patient is doing well and at this point in time she is medically stable for discharge and we are waiting for placement.  I will call him again if there will be any change in medical status.  He was advised to reach out if he has any questions.  Status is: Inpatient  Remains inpatient appropriate because: Unsafe DC plan.  Therapy recommending SNF.  TOC working on finding placement.   Dispo: The patient is from: ALF              Anticipated d/c is to: SNF or back to ALF if they can provide the services that she requires.  TOC working on finding that out.  Anticipated d/c date is: 1 day              Patient currently is medically stable to d/c.    Estimated body mass index is 23.08 kg/m as calculated from the following:   Height as of this encounter: 5' (1.524 m).   Weight as of this encounter: 53.6 kg.      Nutritional status:  Nutrition Problem: Moderate Malnutrition Etiology: chronic illness (dementia)   Signs/Symptoms: mild muscle depletion, mild fat depletion, moderate fat depletion, moderate muscle depletion,  percent weight loss Percent weight loss: 11 %   Interventions: Magic cup, Ensure Enlive (each supplement provides 350kcal and 20 grams of protein)    Consultants:   None  Procedures:   None  Antimicrobials:  Anti-infectives (From admission, onward)   None         Subjective: Seen and examined.  Patient alert and oriented x2.  She has no complaints. Objective: Vitals:   04/15/20 1630 04/15/20 2053 04/16/20 0500 04/16/20 0825  BP: 111/82 108/79 (!) 162/99 (!) 155/86  Pulse: 67 66 69 62  Resp: 18 18 17 18   Temp: (!) 97.5 F (36.4 C) 97.8 F (36.6 C) 97.6 F (36.4 C) 98 F (36.7 C)  TempSrc:    Oral  SpO2: 99% 98% 98% 96%  Weight:   53.6 kg   Height:        Intake/Output Summary (Last 24 hours) at 04/16/2020 1057 Last data filed at 04/16/2020 0520 Gross per 24 hour  Intake 540 ml  Output 700 ml  Net -160 ml   Filed Weights   04/14/20 0135 04/15/20 0457 04/16/20 0500  Weight: 50.4 kg 53.9 kg 53.6 kg    Examination:  General exam: Appears calm and comfortable  Respiratory system: Clear to auscultation. Respiratory effort normal. Cardiovascular system: S1 & S2 heard, RRR. No JVD, murmurs, rubs, gallops or clicks. No pedal edema. Gastrointestinal system: Abdomen is nondistended, soft and nontender. No organomegaly or masses felt. Normal bowel sounds heard. Central nervous system: Alert and oriented x2. No focal neurological deficits. Extremities: Symmetric 5 x 5 power. Skin: No rashes, lesions or ulcers.  Psychiatry: Judgement and insight appear poor. Mood & affect flat   Data Reviewed: I have personally reviewed following labs and imaging studies  CBC: Recent Labs  Lab 04/13/20 1428 04/14/20 0627 04/15/20 0304 04/16/20 0444  WBC 5.8 4.3 5.5 5.1  NEUTROABS  --   --  1.7 2.6  HGB 14.0 12.3 12.0 12.5  HCT 49.2* 41.7 40.3 40.0  MCV 90.6 89.1 87.4 85.7  PLT 140* 107* 104* 144*   Basic Metabolic Panel: Recent Labs  Lab 04/13/20 1428  04/14/20 0627 04/15/20 0304 04/16/20 0444  NA 154* 151* 143 140  K 3.7 3.5 3.3* 3.9  CL 117* 119* 111 108  CO2 24 24 24 25   GLUCOSE 109* 130* 81 88  BUN 36* 31* 27* 21  CREATININE 1.24* 0.89 0.89 0.78  CALCIUM 11.2* 10.4* 10.3 10.4*   GFR: Estimated Creatinine Clearance: 49.7 mL/min (by C-G formula based on SCr of 0.78 mg/dL). Liver Function Tests: Recent Labs  Lab 04/13/20 1428  AST 33  ALT 30  ALKPHOS 99  BILITOT 1.0  PROT 8.8*  ALBUMIN 3.8   No results for input(s): LIPASE, AMYLASE in the last 168 hours. Recent Labs  Lab 04/13/20 1651  AMMONIA 36*   Coagulation Profile: No results for input(s): INR, PROTIME in the last 168 hours. Cardiac Enzymes: No results for input(s): CKTOTAL, CKMB,  CKMBINDEX, TROPONINI in the last 168 hours. BNP (last 3 results) No results for input(s): PROBNP in the last 8760 hours. HbA1C: No results for input(s): HGBA1C in the last 72 hours. CBG: Recent Labs  Lab 04/13/20 1410  GLUCAP 86   Lipid Profile: No results for input(s): CHOL, HDL, LDLCALC, TRIG, CHOLHDL, LDLDIRECT in the last 72 hours. Thyroid Function Tests: Recent Labs    04/14/20 0627  TSH 2.010   Anemia Panel: No results for input(s): VITAMINB12, FOLATE, FERRITIN, TIBC, IRON, RETICCTPCT in the last 72 hours. Sepsis Labs: No results for input(s): PROCALCITON, LATICACIDVEN in the last 168 hours.  Recent Results (from the past 240 hour(s))  SARS Coronavirus 2 by RT PCR (hospital order, performed in [facility] hospital lab) Nasopharyngeal Nasopharyngeal Swab     Status: None   Collection Time: 04/13/20  6:04 PM   Specimen: Nasopharyngeal Swab  Result Value Ref Range Status   SARS Coronavirus 2 NEGATIVE NEGATIVE Final    Comment: (NOTE) SARS-CoV-2 target nucleic acids are NOT DETECTED.  The SARS-CoV-2 RNA is generally detectable in upper and lower respiratory specimens during the acute phase of infection. The lowest concentration of SARS-CoV-2 viral copies this  assay can detect is 250 copies / mL. A negative result does not preclude SARS-CoV-2 infection and should not be used as the sole basis for treatment or other patient management decisions.  A negative result may occur with improper specimen collection / handling, submission of specimen other than nasopharyngeal swab, presence of viral mutation(s) within the areas targeted by this assay, and inadequate number of viral copies (<250 copies / mL). A negative result must be combined with clinical observations, patient history, and epidemiological information.  Fact Sheet for Patients:   StrictlyIdeas.no  Fact Sheet for Healthcare Providers: BankingDealers.co.za  This test is not yet approved or  cleared by the [place] FDA and has been authorized for detection and/or diagnosis of SARS-CoV-2 by FDA under an Emergency Use Authorization (EUA).  This EUA will remain in effect (meaning this test can be used) for the duration of the COVID-19 declaration under Section 564(b)(1) of the Act, 21 U.S.C. section 360bbb-3(b)(1), unless the authorization is terminated or revoked sooner.  Performed at [facility], [place] [address] [place], [place] [address]   MRSA PCR Screening     Status: None   Collection Time: 04/14/20  1:56 PM   Specimen: Nasopharyngeal  Result Value Ref Range Status   MRSA by PCR NEGATIVE NEGATIVE Final    Comment:        The GeneXpert MRSA Assay (FDA approved for NASAL specimens only), is one component of a comprehensive MRSA colonization surveillance program. It is not intended to diagnose MRSA infection nor to guide or monitor treatment for MRSA infections. Performed at [facility], [place] [address] [place], [place] [address]       Radiology Studies: No results found.  Scheduled Meds: . aspirin  325 mg Oral Daily  . carbidopa-levodopa  1 tablet Oral TID  . carvedilol  12.5 mg Oral BID WC  .  chlorhexidine  15 mL Mouth Rinse BID  . divalproex  125 mg Oral Daily  . donepezil  10 mg Oral Daily  . feeding supplement (ENSURE ENLIVE)  237 mL Oral TID BM  . furosemide  20 mg Oral Daily  . irbesartan  75 mg Oral Daily  . labetalol  20 mg Intravenous Once  . LORazepam  0.5 mg Oral QPM  . mouth  rinse  15 mL Mouth Rinse q12n4p  . multivitamin with minerals  1 tablet Oral Daily  . polyethylene glycol  17 g Oral Daily  . rosuvastatin  10 mg Oral Daily  . sertraline  100 mg Oral QHS  . sodium chloride flush  3 mL Intravenous Once   Continuous Infusions:    LOS: 3 days   Time spent: 28 minutes   Darliss Cheney, MD [facility]  04/16/2020, 10:57 AM   To contact the attending provider between 7A-7P or the covering provider during after hours 7P-7A, please log into the web site [email or web address].

## 2020-04-16 NOTE — Plan of Care (Signed)
  Problem: Nutrition: Goal: Adequate nutrition will be maintained Outcome: Progressing   

## 2020-04-17 ENCOUNTER — Other Ambulatory Visit: Payer: Self-pay | Admitting: *Deleted

## 2020-04-17 DIAGNOSIS — E440 Moderate protein-calorie malnutrition: Secondary | ICD-10-CM

## 2020-04-17 LAB — CBC WITH DIFFERENTIAL/PLATELET
Abs Immature Granulocytes: 0.02 10*3/uL (ref 0.00–0.07)
Basophils Absolute: 0 10*3/uL (ref 0.0–0.1)
Basophils Relative: 1 %
Eosinophils Absolute: 0.1 10*3/uL (ref 0.0–0.5)
Eosinophils Relative: 2 %
HCT: 39.5 % (ref 36.0–46.0)
Hemoglobin: 12.2 g/dL (ref 12.0–15.0)
Immature Granulocytes: 1 %
Lymphocytes Relative: 45 %
Lymphs Abs: 2 10*3/uL (ref 0.7–4.0)
MCH: 25.9 pg — ABNORMAL LOW (ref 26.0–34.0)
MCHC: 30.9 g/dL (ref 30.0–36.0)
MCV: 83.9 fL (ref 80.0–100.0)
Monocytes Absolute: 0.5 10*3/uL (ref 0.1–1.0)
Monocytes Relative: 11 %
Neutro Abs: 1.7 10*3/uL (ref 1.7–7.7)
Neutrophils Relative %: 40 %
Platelets: 116 10*3/uL — ABNORMAL LOW (ref 150–400)
RBC: 4.71 MIL/uL (ref 3.87–5.11)
RDW: 19.1 % — ABNORMAL HIGH (ref 11.5–15.5)
WBC: 4.3 10*3/uL (ref 4.0–10.5)
nRBC: 0 % (ref 0.0–0.2)

## 2020-04-17 LAB — SARS CORONAVIRUS 2 BY RT PCR (HOSPITAL ORDER, PERFORMED IN ~~LOC~~ HOSPITAL LAB): SARS Coronavirus 2: NEGATIVE

## 2020-04-17 NOTE — Progress Notes (Signed)
PROGRESS NOTE    [name]  ION:[id number] DOB: 03/25/1956 DOA: 04/13/2020 PCP: Lujean Amel, MD   Brief Narrative:  [name] is a 64 y.o. female with medical history significant of advanced dementia, Parkinson disease, hypertension, hypercalcemia, chronic diastolic CHF, presented with worsening confusion and not eating or drinking for 3 days.  Reportedly, patient probably stopped taking her medications for the last 3 days as well. She denied any feeling of depression or any other symptoms, but reports she just does not want to eat or drink not feeling hungry or thirsty.  Upon arrival to ED, she was Severely hydrated, sodium 154, chloride 117, calcium 11.2.    CT head showed chronic cortical right parietal lobe infarct.  No new acute pathology.  CT cervical spine was also unremarkable.  Patient was admitted with the diagnosis of hypovolemic hyponatremia and hypercalcemia and dehydration and altered mental status/acute encephalopathy secondary to dehydration.  Patient was started on IV fluids, her electrolytes abnormalities resolved.  Patient became more alert and back to her baseline however she remains intermittently confused due to her baseline dementia.  Seen by PT OT who recommended discharging to SNF.  Assessment & Plan:   Active Problems:   Essential hypertension   Dementia due to Parkinson's disease without behavioral disturbance (HCC)   Dehydration, severe   Dehydration   Acute hypernatremia   Hypercalcemia   Malnutrition of moderate degree   Severe dehydration with hypernatremia: Looks better than yesterday.  Sodium improved to normal. Eating and drinking okay.    Acute encephalopathy: Likely secondary to dehydration and hyponatremia.  TSH normal.  Very low valproic acid level.  Now alert and intermittently oriented. Had a long discussion with patient's niece over the phone day before yesterday.  She verified that her mental status is likely at her baseline however  also stated that she is declining.  Her niece claims that the patient was likely not getting medications at ALF.  She is not opposed to her going to SNF.  [name] also wants her to go to SNF.  However I was informed today by TOC that her insurance company has denied authorization to go to SNF.  I did peer to peer with Dr. Amalia Hailey per message received from [facility] team.  Per Dr. Amalia Hailey, patient is a frequent flyer and she rather qualifies for custodial care/LTC instead of SNF for short-term care.  I informed TOC about this denial and peer to peer call.  TOC to reach out to the son and work with family to find out alternatives.  Anorexia: No clear etiology but aging could be the main cause.  Now looks better.  Continue Ensure 3 times daily per recommendation from SLP.  Hypercalcemia: Likely due to dehydration.  Resolved.  No more IV fluids.  Uncontrolled hypertension: Unsure whether she is noncompliant or has not been given medications at ALF.  Blood pressure now better.  Continue home medications.  Parkinson's -Resume Sinemet  DVT prophylaxis:    Code Status: Full Code  Family Communication:  None present at bedside.  Had a long discussion with patient's niece [name] over the phone 04/14/2020 and called her son [name] and left him a voicemail and he called me back few minutes later on 04/16/2020.  I informed him that patient is doing well and at this point in time she is medically stable for discharge and we are waiting for placement.  I will call him again if there will be any change in medical  status.  He was advised to reach out if he has any questions.  Status is: Inpatient  Remains inpatient appropriate because: Unsafe DC plan.  Therapy recommending SNF.  TOC working on finding placement.   Dispo: The patient is from: ALF              Anticipated d/c is to: SNF or back to ALF if they can provide the services that she requires.  TOC working on finding that out.              Anticipated  d/c date is: 1 day              Patient currently is medically stable to d/c.    Estimated body mass index is 22.91 kg/m as calculated from the following:   Height as of this encounter: 5' (1.524 m).   Weight as of this encounter: 53.2 kg.      Nutritional status:  Nutrition Problem: Moderate Malnutrition Etiology: chronic illness (dementia)   Signs/Symptoms: mild muscle depletion, mild fat depletion, moderate fat depletion, moderate muscle depletion, percent weight loss Percent weight loss: 11 %   Interventions: Magic cup, Ensure Enlive (each supplement provides 350kcal and 20 grams of protein)    Consultants:   None  Procedures:   None  Antimicrobials:  Anti-infectives (From admission, onward)   None         Subjective: Patient seen and examined.  She has no complaints. Objective: Vitals:   04/16/20 1630 04/16/20 2031 04/17/20 0514 04/17/20 0953  BP: (!) 148/82 116/71 104/82 91/69  Pulse: 69 65 84   Resp: 16 17 17 16   Temp: 97.8 F (36.6 C) 98.6 F (37 C) 97.6 F (36.4 C) 98.9 F (37.2 C)  TempSrc: Oral Oral  Oral  SpO2: 95% 99% 99% 95%  Weight:   53.2 kg   Height:        Intake/Output Summary (Last 24 hours) at 04/17/2020 1055 Last data filed at 04/17/2020 0800 Gross per 24 hour  Intake 730 ml  Output 1750 ml  Net -1020 ml   Filed Weights   04/15/20 0457 04/16/20 0500 04/17/20 0514  Weight: 53.9 kg 53.6 kg 53.2 kg    Examination:  General exam: Appears calm and comfortable but chronically sick looking Respiratory system: Clear to auscultation. Respiratory effort normal. Cardiovascular system: S1 & S2 heard, RRR. No JVD, murmurs, rubs, gallops or clicks. No pedal edema. Gastrointestinal system: Abdomen is nondistended, soft and nontender. No organomegaly or masses felt. Normal bowel sounds heard. Central nervous system: Alert and oriented. No focal neurological deficits. Extremities: Symmetric 5 x 5 power. Skin: No rashes, lesions or  ulcers.  Psychiatry: Judgement and insight appear poor. Mood & affect flat.    Data Reviewed: I have personally reviewed following labs and imaging studies  CBC: Recent Labs  Lab 04/13/20 1428 04/14/20 0627 04/15/20 0304 04/16/20 0444 04/17/20 0305  WBC 5.8 4.3 5.5 5.1 4.3  NEUTROABS  --   --  1.7 2.6 1.7  HGB 14.0 12.3 12.0 12.5 12.2  HCT 49.2* 41.7 40.3 40.0 39.5  MCV 90.6 89.1 87.4 85.7 83.9  PLT 140* 107* 104* 114* 426*   Basic Metabolic Panel: Recent Labs  Lab 04/13/20 1428 04/14/20 0627 04/15/20 0304 04/16/20 0444  NA 154* 151* 143 140  K 3.7 3.5 3.3* 3.9  CL 117* 119* 111 108  CO2 24 24 24 25   GLUCOSE 109* 130* 81 88  BUN 36* 31* 27* 21  CREATININE 1.24*  0.89 0.89 0.78  CALCIUM 11.2* 10.4* 10.3 10.4*   GFR: Estimated Creatinine Clearance: 49.7 mL/min (by C-G formula based on SCr of 0.78 mg/dL). Liver Function Tests: Recent Labs  Lab 04/13/20 1428  AST 33  ALT 30  ALKPHOS 99  BILITOT 1.0  PROT 8.8*  ALBUMIN 3.8   No results for input(s): LIPASE, AMYLASE in the last 168 hours. Recent Labs  Lab 04/13/20 1651  AMMONIA 36*   Coagulation Profile: No results for input(s): INR, PROTIME in the last 168 hours. Cardiac Enzymes: No results for input(s): CKTOTAL, CKMB, CKMBINDEX, TROPONINI in the last 168 hours. BNP (last 3 results) No results for input(s): PROBNP in the last 8760 hours. HbA1C: No results for input(s): HGBA1C in the last 72 hours. CBG: Recent Labs  Lab 04/13/20 1410  GLUCAP 86   Lipid Profile: No results for input(s): CHOL, HDL, LDLCALC, TRIG, CHOLHDL, LDLDIRECT in the last 72 hours. Thyroid Function Tests: No results for input(s): TSH, T4TOTAL, FREET4, T3FREE, THYROIDAB in the last 72 hours. Anemia Panel: No results for input(s): VITAMINB12, FOLATE, FERRITIN, TIBC, IRON, RETICCTPCT in the last 72 hours. Sepsis Labs: No results for input(s): PROCALCITON, LATICACIDVEN in the last 168 hours.  Recent Results (from the past 240  hour(s))  SARS Coronavirus 2 by RT PCR (hospital order, performed in [facility] hospital lab) Nasopharyngeal Nasopharyngeal Swab     Status: None   Collection Time: 04/13/20  6:04 PM   Specimen: Nasopharyngeal Swab  Result Value Ref Range Status   SARS Coronavirus 2 NEGATIVE NEGATIVE Final    Comment: (NOTE) SARS-CoV-2 target nucleic acids are NOT DETECTED.  The SARS-CoV-2 RNA is generally detectable in upper and lower respiratory specimens during the acute phase of infection. The lowest concentration of SARS-CoV-2 viral copies this assay can detect is 250 copies / mL. A negative result does not preclude SARS-CoV-2 infection and should not be used as the sole basis for treatment or other patient management decisions.  A negative result may occur with improper specimen collection / handling, submission of specimen other than nasopharyngeal swab, presence of viral mutation(s) within the areas targeted by this assay, and inadequate number of viral copies (<250 copies / mL). A negative result must be combined with clinical observations, patient history, and epidemiological information.  Fact Sheet for Patients:   StrictlyIdeas.no  Fact Sheet for Healthcare Providers: BankingDealers.co.za  This test is not yet approved or  cleared by the [place] FDA and has been authorized for detection and/or diagnosis of SARS-CoV-2 by FDA under an Emergency Use Authorization (EUA).  This EUA will remain in effect (meaning this test can be used) for the duration of the COVID-19 declaration under Section 564(b)(1) of the Act, 21 U.S.C. section 360bbb-3(b)(1), unless the authorization is terminated or revoked sooner.  Performed at [facility], [place] [address] [place], [place] [address]   MRSA PCR Screening     Status: None   Collection Time: 04/14/20  1:56 PM   Specimen: Nasopharyngeal  Result Value Ref Range Status   MRSA by PCR NEGATIVE  NEGATIVE Final    Comment:        The GeneXpert MRSA Assay (FDA approved for NASAL specimens only), is one component of a comprehensive MRSA colonization surveillance program. It is not intended to diagnose MRSA infection nor to guide or monitor treatment for MRSA infections. Performed at [facility], [place] [address], [place], [place] [address]   SARS Coronavirus 2 by RT PCR (hospital order,  performed in [facility] hospital lab) Nasopharyngeal Nasopharyngeal Swab     Status: None   Collection Time: 04/17/20  9:06 AM   Specimen: Nasopharyngeal Swab  Result Value Ref Range Status   SARS Coronavirus 2 NEGATIVE NEGATIVE Final    Comment: (NOTE) SARS-CoV-2 target nucleic acids are NOT DETECTED.  The SARS-CoV-2 RNA is generally detectable in upper and lower respiratory specimens during the acute phase of infection. The lowest concentration of SARS-CoV-2 viral copies this assay can detect is 250 copies / mL. A negative result does not preclude SARS-CoV-2 infection and should not be used as the sole basis for treatment or other patient management decisions.  A negative result may occur with improper specimen collection / handling, submission of specimen other than nasopharyngeal swab, presence of viral mutation(s) within the areas targeted by this assay, and inadequate number of viral copies (<250 copies / mL). A negative result must be combined with clinical observations, patient history, and epidemiological information.  Fact Sheet for Patients:   StrictlyIdeas.no  Fact Sheet for Healthcare Providers: BankingDealers.co.za  This test is not yet approved or  cleared by the [place] FDA and has been authorized for detection and/or diagnosis of SARS-CoV-2 by FDA under an Emergency Use Authorization (EUA).  This EUA will remain in effect (meaning this test can be used) for the duration of the COVID-19 declaration under Section  564(b)(1) of the Act, 21 U.S.C. section 360bbb-3(b)(1), unless the authorization is terminated or revoked sooner.  Performed at [facility], [place] [address] [place], [place] [address]       Radiology Studies: No results found.  Scheduled Meds: . aspirin  325 mg Oral Daily  . carbidopa-levodopa  1 tablet Oral TID  . carvedilol  12.5 mg Oral BID WC  . chlorhexidine  15 mL Mouth Rinse BID  . divalproex  125 mg Oral Daily  . donepezil  10 mg Oral Daily  . feeding supplement (ENSURE ENLIVE)  237 mL Oral TID BM  . furosemide  20 mg Oral Daily  . irbesartan  75 mg Oral Daily  . labetalol  20 mg Intravenous Once  . LORazepam  0.5 mg Oral QPM  . mouth rinse  15 mL Mouth Rinse q12n4p  . multivitamin with minerals  1 tablet Oral Daily  . polyethylene glycol  17 g Oral Daily  . rosuvastatin  10 mg Oral Daily  . sertraline  100 mg Oral QHS  . sodium chloride flush  3 mL Intravenous Once   Continuous Infusions:    LOS: 4 days   Time spent: 30 minutes   Darliss Cheney, MD [facility]  04/17/2020, 10:55 AM   To contact the attending provider between 7A-7P or the covering provider during after hours 7P-7A, please log into the web site [email or web address].

## 2020-04-17 NOTE — Plan of Care (Signed)
Patient is medically stable for discharge. Awaiting for TOC to arrange placement.

## 2020-04-17 NOTE — Plan of Care (Signed)
  Problem: Activity: Goal: Risk for activity intolerance will decrease Outcome: Progressing   Problem: Nutrition: Goal: Adequate nutrition will be maintained Outcome: Progressing   Problem: Coping: Goal: Level of anxiety will decrease Outcome: Progressing   

## 2020-04-17 NOTE — TOC Progression Note (Addendum)
Transition of Care [facility]) - Progression Note    Patient Details  Name: LORRENE GRAEF MRN: [id number] Date of Birth: 1956/02/03  Transition of Care [facility]) CM/SW [place], [place] Phone Number: 04/17/2020, 10:42 AM  Clinical Narrative:    CSW spoke with [employer] and informed patient's ambulance authorization was approved, but the SNF authorization was denied. A peer to peer is needed with Dr. Amalia Hailey [phone]. CSW provided MD with an update. CSW contacted patient's son [name] and provided an update.   Expected Discharge Plan: Skilled Nursing Facility Barriers to Discharge: Ship broker, Other (comment) (Initiating facility search)  Expected Discharge Plan and Services Expected Discharge Plan: [place] In-house Referral: Clinical Social Work     Living arrangements for the past 2 months: [place] ([facility])                                       Social Determinants of Health (SDOH) Interventions    Readmission Risk Interventions Readmission Risk Prevention Plan 01/02/2020 12/25/2019  Transportation Screening Complete Complete  PCP or Specialist Appt within 5-7 Days Complete Complete  Home Care Screening Complete Complete  Medication Review (RN CM) Complete Complete  Some recent data might be hidden

## 2020-04-17 NOTE — Patient Outreach (Signed)
[facility]) Care Management  04/17/2020  [name] 1955/12/31 [id number]   CSW collaborated with Crawford Givens, LCSW, Inpatient CSW, this week to assist with pt's transition to SNF level of care.  CSW spoke with son and provided him with updated SNF info and he has worked with [name] to select SNF bed and plan for pt's hospital discharge and admit SNF for long term care.  CSW will plan to sign off at this time and will advise PCP and [facility] SNF Liaison for continued follow up as needed.   [name], MSW, [place] Worker  [place] [phone]

## 2020-04-18 LAB — CBC WITH DIFFERENTIAL/PLATELET
Abs Immature Granulocytes: 0.02 10*3/uL (ref 0.00–0.07)
Basophils Absolute: 0 10*3/uL (ref 0.0–0.1)
Basophils Relative: 1 %
Eosinophils Absolute: 0 10*3/uL (ref 0.0–0.5)
Eosinophils Relative: 1 %
HCT: 39.5 % (ref 36.0–46.0)
Hemoglobin: 12.4 g/dL (ref 12.0–15.0)
Immature Granulocytes: 1 %
Lymphocytes Relative: 43 %
Lymphs Abs: 1.8 10*3/uL (ref 0.7–4.0)
MCH: 26.4 pg (ref 26.0–34.0)
MCHC: 31.4 g/dL (ref 30.0–36.0)
MCV: 84.2 fL (ref 80.0–100.0)
Monocytes Absolute: 0.6 10*3/uL (ref 0.1–1.0)
Monocytes Relative: 13 %
Neutro Abs: 1.7 10*3/uL (ref 1.7–7.7)
Neutrophils Relative %: 41 %
Platelets: 115 10*3/uL — ABNORMAL LOW (ref 150–400)
RBC: 4.69 MIL/uL (ref 3.87–5.11)
RDW: 19.2 % — ABNORMAL HIGH (ref 11.5–15.5)
WBC: 4.2 10*3/uL (ref 4.0–10.5)
nRBC: 0 % (ref 0.0–0.2)

## 2020-04-18 NOTE — Progress Notes (Signed)
PROGRESS NOTE    [name]  BOF:[id number] DOB: [birth year] DOA: 04/13/2020 PCP: Lujean Amel, MD   Brief Narrative:  [name] is a 64 y.o. female with medical history significant of advanced dementia, Parkinson disease, hypertension, hypercalcemia, chronic diastolic CHF, presented with worsening confusion and not eating or drinking for 3 days.  Reportedly, patient probably stopped taking her medications for the last 3 days as well. She denied any feeling of depression or any other symptoms, but reports she just does not want to eat or drink not feeling hungry or thirsty.  Upon arrival to ED, she was Severely hydrated, sodium 154, chloride 117, calcium 11.2.    CT head showed chronic cortical right parietal lobe infarct.  No new acute pathology.  CT cervical spine was also unremarkable.  Patient was admitted with the diagnosis of hypovolemic hyponatremia and hypercalcemia and dehydration and altered mental status/acute encephalopathy secondary to dehydration.  Patient was started on IV fluids, her electrolytes abnormalities resolved.  Patient became more alert and back to her baseline however she remains intermittently confused due to her baseline dementia.  Seen by PT OT who recommended discharging to SNF.  Assessment & Plan:   Active Problems:   Essential hypertension   Dementia due to Parkinson's disease without behavioral disturbance (HCC)   Dehydration, severe   Dehydration   Acute hypernatremia   Hypercalcemia   Malnutrition of moderate degree   Severe dehydration with hypernatremia: Looks better than yesterday.  Sodium improved to normal. Eating and drinking okay.    Acute encephalopathy: Likely secondary to dehydration and hyponatremia.  TSH normal.  Very low valproic acid level.  Now alert and intermittently oriented. Had a long discussion with patient's niece over the phone day before yesterday.  She verified that her mental status is likely at her baseline however  also stated that she is declining.  Her niece claims that the patient was likely not getting medications at ALF.  She is not opposed to her going to SNF.  [name] also wants her to go to SNF.  However I was informed today by TOC that her insurance company has denied authorization to go to SNF.  I did peer to peer with Dr. Amalia Hailey per message received from [facility] team on 04/17/2020.  Per Dr. Amalia Hailey, patient is a frequent flyer and she rather qualifies for custodial care/LTC instead of SNF for short-term care.  He also informed me that patient has been given this recommendation from insurance company during her prior hospitalizations as well.  I informed TOC about this denial and peer to peer call.  Per my discussion with social worker, son is planning to apply for long-term Medicaid again.  Unfortunately, patient does not have a safe discharge plan.  Anorexia: No clear etiology but aging could be the main cause.  Now looks better.  Continue Ensure 3 times daily per recommendation from SLP.  Hypercalcemia: Likely due to dehydration.  Resolved.  No more IV fluids.  Uncontrolled hypertension: Unsure whether she is noncompliant or has not been given medications at ALF.  Blood pressure now better.  Continue home medications.  Parkinson's -Resume Sinemet  DVT prophylaxis:    Code Status: Full Code  Family Communication:  None present at bedside.  Had a long discussion with patient's niece Dewaine Oats over the phone 04/14/2020 and called her son [name] and left him a voicemail and he called me back few minutes later on 04/16/2020.  I informed him that patient is doing  well and at this point in time she is medically stable for discharge and we are waiting for placement.  I will call him again if there will be any change in medical status.  He was advised to reach out if he has any questions.   Status is: Inpatient  Remains inpatient appropriate because:Unsafe d/c plan   Dispo:  Patient From:  Home  Planned Disposition: [place]  Expected discharge date: Unknown  medically stable for discharge: Yes     Estimated body mass index is 22.91 kg/m as calculated from the following:   Height as of this encounter: 5' (1.524 m).   Weight as of this encounter: 53.2 kg.      Nutritional status:  Nutrition Problem: Moderate Malnutrition Etiology: chronic illness (dementia)   Signs/Symptoms: mild muscle depletion, mild fat depletion, moderate fat depletion, moderate muscle depletion, percent weight loss Percent weight loss: 11 %   Interventions: Magic cup, Ensure Enlive (each supplement provides 350kcal and 20 grams of protein)    Consultants:   None  Procedures:   None  Antimicrobials:  Anti-infectives (From admission, onward)   None         Subjective: Patient seen and examined.  No change.  Status quo.  Denied any complaint. Objective: Vitals:   04/17/20 1744 04/17/20 2109 04/18/20 0451 04/18/20 0824  BP: 102/84 120/84 110/79 (!) 130/92  Pulse: 83 68 70 73  Resp: 20 18 14 18   Temp: 98.2 F (36.8 C) 99.2 F (37.3 C) 99.7 F (37.6 C) 99.2 F (37.3 C)  TempSrc: Oral Oral Oral Oral  SpO2: 97% 96% 96% 98%  Weight:      Height:        Intake/Output Summary (Last 24 hours) at 04/18/2020 1315 Last data filed at 04/18/2020 1300 Gross per 24 hour  Intake 600 ml  Output 550 ml  Net 50 ml   Filed Weights   04/15/20 0457 04/16/20 0500 04/17/20 0514  Weight: 53.9 kg 53.6 kg 53.2 kg    Examination:  General exam: Appears calm and comfortable  Respiratory system: Clear to auscultation. Respiratory effort normal. Cardiovascular system: S1 & S2 heard, RRR. No JVD, murmurs, rubs, gallops or clicks. No pedal edema. Gastrointestinal system: Abdomen is nondistended, soft and nontender. No organomegaly or masses felt. Normal bowel sounds heard. Central nervous system: Partially alert and oriented x2. No focal neurological deficits.  Data  Reviewed: I have personally reviewed following labs and imaging studies  CBC: Recent Labs  Lab 04/14/20 0627 04/15/20 0304 04/16/20 0444 04/17/20 0305 04/18/20 0508  WBC 4.3 5.5 5.1 4.3 4.2  NEUTROABS  --  1.7 2.6 1.7 1.7  HGB 12.3 12.0 12.5 12.2 12.4  HCT 41.7 40.3 40.0 39.5 39.5  MCV 89.1 87.4 85.7 83.9 84.2  PLT 107* 104* 114* 116* 295*   Basic Metabolic Panel: Recent Labs  Lab 04/13/20 1428 04/14/20 0627 04/15/20 0304 04/16/20 0444  NA 154* 151* 143 140  K 3.7 3.5 3.3* 3.9  CL 117* 119* 111 108  CO2 24 24 24 25   GLUCOSE 109* 130* 81 88  BUN 36* 31* 27* 21  CREATININE 1.24* 0.89 0.89 0.78  CALCIUM 11.2* 10.4* 10.3 10.4*   GFR: Estimated Creatinine Clearance: 49.7 mL/min (by C-G formula based on SCr of 0.78 mg/dL). Liver Function Tests: Recent Labs  Lab 04/13/20 1428  AST 33  ALT 30  ALKPHOS 99  BILITOT 1.0  PROT 8.8*  ALBUMIN 3.8   No results for input(s): LIPASE, AMYLASE  in the last 168 hours. Recent Labs  Lab 04/13/20 1651  AMMONIA 36*   Coagulation Profile: No results for input(s): INR, PROTIME in the last 168 hours. Cardiac Enzymes: No results for input(s): CKTOTAL, CKMB, CKMBINDEX, TROPONINI in the last 168 hours. BNP (last 3 results) No results for input(s): PROBNP in the last 8760 hours. HbA1C: No results for input(s): HGBA1C in the last 72 hours. CBG: Recent Labs  Lab 04/13/20 1410  GLUCAP 86   Lipid Profile: No results for input(s): CHOL, HDL, LDLCALC, TRIG, CHOLHDL, LDLDIRECT in the last 72 hours. Thyroid Function Tests: No results for input(s): TSH, T4TOTAL, FREET4, T3FREE, THYROIDAB in the last 72 hours. Anemia Panel: No results for input(s): VITAMINB12, FOLATE, FERRITIN, TIBC, IRON, RETICCTPCT in the last 72 hours. Sepsis Labs: No results for input(s): PROCALCITON, LATICACIDVEN in the last 168 hours.  Recent Results (from the past 240 hour(s))  SARS Coronavirus 2 by RT PCR (hospital order, performed in [facility] hospital  lab) Nasopharyngeal Nasopharyngeal Swab     Status: None   Collection Time: 04/13/20  6:04 PM   Specimen: Nasopharyngeal Swab  Result Value Ref Range Status   SARS Coronavirus 2 NEGATIVE NEGATIVE Final    Comment: (NOTE) SARS-CoV-2 target nucleic acids are NOT DETECTED.  The SARS-CoV-2 RNA is generally detectable in upper and lower respiratory specimens during the acute phase of infection. The lowest concentration of SARS-CoV-2 viral copies this assay can detect is 250 copies / mL. A negative result does not preclude SARS-CoV-2 infection and should not be used as the sole basis for treatment or other patient management decisions.  A negative result may occur with improper specimen collection / handling, submission of specimen other than nasopharyngeal swab, presence of viral mutation(s) within the areas targeted by this assay, and inadequate number of viral copies (<250 copies / mL). A negative result must be combined with clinical observations, patient history, and epidemiological information.  Fact Sheet for Patients:   StrictlyIdeas.no  Fact Sheet for Healthcare Providers: BankingDealers.co.za  This test is not yet approved or  cleared by the [place] FDA and has been authorized for detection and/or diagnosis of SARS-CoV-2 by FDA under an Emergency Use Authorization (EUA).  This EUA will remain in effect (meaning this test can be used) for the duration of the COVID-19 declaration under Section 564(b)(1) of the Act, 21 U.S.C. section 360bbb-3(b)(1), unless the authorization is terminated or revoked sooner.  Performed at [facility], [place] [address], [place], [place] [address]   MRSA PCR Screening     Status: None   Collection Time: 04/14/20  1:56 PM   Specimen: Nasopharyngeal  Result Value Ref Range Status   MRSA by PCR NEGATIVE NEGATIVE Final    Comment:        The GeneXpert MRSA Assay (FDA approved for NASAL  specimens only), is one component of a comprehensive MRSA colonization surveillance program. It is not intended to diagnose MRSA infection nor to guide or monitor treatment for MRSA infections. Performed at [facility], [place] [address], [place], [place] [address]   SARS Coronavirus 2 by RT PCR (hospital order, performed in [facility] hospital lab) Nasopharyngeal Nasopharyngeal Swab     Status: None   Collection Time: 04/17/20  9:06 AM   Specimen: Nasopharyngeal Swab  Result Value Ref Range Status   SARS Coronavirus 2 NEGATIVE NEGATIVE Final    Comment: (NOTE) SARS-CoV-2 target nucleic acids are NOT DETECTED.  The SARS-CoV-2 RNA is generally detectable  in upper and lower respiratory specimens during the acute phase of infection. The lowest concentration of SARS-CoV-2 viral copies this assay can detect is 250 copies / mL. A negative result does not preclude SARS-CoV-2 infection and should not be used as the sole basis for treatment or other patient management decisions.  A negative result may occur with improper specimen collection / handling, submission of specimen other than nasopharyngeal swab, presence of viral mutation(s) within the areas targeted by this assay, and inadequate number of viral copies (<250 copies / mL). A negative result must be combined with clinical observations, patient history, and epidemiological information.  Fact Sheet for Patients:   StrictlyIdeas.no  Fact Sheet for Healthcare Providers: BankingDealers.co.za  This test is not yet approved or  cleared by the [place] FDA and has been authorized for detection and/or diagnosis of SARS-CoV-2 by FDA under an Emergency Use Authorization (EUA).  This EUA will remain in effect (meaning this test can be used) for the duration of the COVID-19 declaration under Section 564(b)(1) of the Act, 21 U.S.C. section 360bbb-3(b)(1), unless the authorization is  terminated or revoked sooner.  Performed at [facility], [place] [address] [place], [place] [address]       Radiology Studies: No results found.  Scheduled Meds: . aspirin  325 mg Oral Daily  . carbidopa-levodopa  1 tablet Oral TID  . carvedilol  12.5 mg Oral BID WC  . chlorhexidine  15 mL Mouth Rinse BID  . divalproex  125 mg Oral Daily  . donepezil  10 mg Oral Daily  . feeding supplement (ENSURE ENLIVE)  237 mL Oral TID BM  . furosemide  20 mg Oral Daily  . irbesartan  75 mg Oral Daily  . labetalol  20 mg Intravenous Once  . LORazepam  0.5 mg Oral QPM  . mouth rinse  15 mL Mouth Rinse q12n4p  . multivitamin with minerals  1 tablet Oral Daily  . polyethylene glycol  17 g Oral Daily  . rosuvastatin  10 mg Oral Daily  . sertraline  100 mg Oral QHS  . sodium chloride flush  3 mL Intravenous Once   Continuous Infusions:    LOS: 5 days   Time spent: 28 minutes   Darliss Cheney, MD [facility]  04/18/2020, 1:15 PM   To contact the attending provider between 7A-7P or the covering provider during after hours 7P-7A, please log into the web site [email or web address].

## 2020-04-18 NOTE — TOC Progression Note (Signed)
Transition of Care [facility]) - Progression Note    Patient Details  Name: [name] MRN: [id number] Date of Birth: 09/04/1956  Transition of Care [facility]) CM/SW [place], [place] Phone Number: 04/18/2020, 12:13 PM  Clinical Narrative:    CSW spoke with patient's son [name] to provide an update on the insurance denial. [name] expressed understanding. Son stated he applied for medicaid a couple of months ago and he will have to apply for long-term Medicaid again. [name] stated he will start the process this week.  [employer] provided with the update. SNF provided son with a Medicaid application Friday. TOC team will continue to follow.  Expected Discharge Plan: Skilled Nursing Facility Barriers to Discharge: Ship broker, Other (comment) (Initiating facility search)  Expected Discharge Plan and Services Expected Discharge Plan: [place] In-house Referral: Clinical Social Work     Living arrangements for the past 2 months: [place] ([facility])                                       Social Determinants of Health (SDOH) Interventions    Readmission Risk Interventions Readmission Risk Prevention Plan 01/02/2020 12/25/2019  Transportation Screening Complete Complete  PCP or Specialist Appt within 5-7 Days Complete Complete  Home Care Screening Complete Complete  Medication Review (RN CM) Complete Complete  Some recent data might be hidden

## 2020-04-19 ENCOUNTER — Ambulatory Visit: Payer: Self-pay | Admitting: *Deleted

## 2020-04-19 LAB — CBC WITH DIFFERENTIAL/PLATELET
Abs Immature Granulocytes: 0.02 10*3/uL (ref 0.00–0.07)
Basophils Absolute: 0 10*3/uL (ref 0.0–0.1)
Basophils Relative: 1 %
Eosinophils Absolute: 0 10*3/uL (ref 0.0–0.5)
Eosinophils Relative: 1 %
HCT: 39.9 % (ref 36.0–46.0)
Hemoglobin: 12.4 g/dL (ref 12.0–15.0)
Immature Granulocytes: 0 %
Lymphocytes Relative: 33 %
Lymphs Abs: 1.7 10*3/uL (ref 0.7–4.0)
MCH: 26.2 pg (ref 26.0–34.0)
MCHC: 31.1 g/dL (ref 30.0–36.0)
MCV: 84.4 fL (ref 80.0–100.0)
Monocytes Absolute: 0.7 10*3/uL (ref 0.1–1.0)
Monocytes Relative: 14 %
Neutro Abs: 2.8 10*3/uL (ref 1.7–7.7)
Neutrophils Relative %: 51 %
Platelets: 126 10*3/uL — ABNORMAL LOW (ref 150–400)
RBC: 4.73 MIL/uL (ref 3.87–5.11)
RDW: 19.3 % — ABNORMAL HIGH (ref 11.5–15.5)
WBC: 5.3 10*3/uL (ref 4.0–10.5)
nRBC: 0 % (ref 0.0–0.2)

## 2020-04-19 NOTE — TOC Progression Note (Signed)
Transition of Care [facility]) - Progression Note    Patient Details  Name: [name] MRN: [id number] Date of Birth: July 26, 1956  Transition of Care [facility]) CM/SW Contact  Sharlet Salina Mila Homer, LCSW Phone Number: 04/19/2020, 5:41 PM  Clinical Narrative:  Talked with son [name] [phone]) regarding his mother's readiness for discharge and her discharge disposition. When asked, son has not applied for Medici ad and this was discussed in terms of the importance of getting the Medicaid application started. CSW advised son that he can complete and turn in the application or go in and apply. CSW cited the advantages of going to DSS to apply as he can get his questions answered and be given a sheet that tells him what is needed to process his mother's application. Son expressed awareness that DSS workers can be difficult to reach by phone. CSW stressed the importance of getting the MA application done as soon as possible.   [name] was advised that [facility] will be contacted tomorrow regarding her readiness for discharge and he will be kept informed. .   Expected Discharge Plan: Skilled Nursing Facility Barriers to Discharge: Ship broker, Other (comment) (Initiating facility search)  Expected Discharge Plan and Services Expected Discharge Plan: [place] In-house Referral: Clinical Social Work     Living arrangements for the past 2 months: [place] ([facility])                                       Social Determinants of Health (SDOH) Interventions    Readmission Risk Interventions Readmission Risk Prevention Plan 01/02/2020 12/25/2019  Transportation Screening Complete Complete  PCP or Specialist Appt within 5-7 Days Complete Complete  Home Care Screening Complete Complete  Medication Review (RN CM) Complete Complete  Some recent data might be hidden

## 2020-04-19 NOTE — Progress Notes (Signed)
PROGRESS NOTE    [name]  RXV:[id number] DOB: Feb 07, 1956 DOA: 04/13/2020 PCP: Lujean Amel, MD   Brief Narrative:  [name] is a 64 y.o. female with medical history significant of advanced dementia, Parkinson disease, hypertension, hypercalcemia, chronic diastolic CHF, presented with worsening confusion and not eating or drinking for 3 days.  Reportedly, patient probably stopped taking her medications for the last 3 days as well. She denied any feeling of depression or any other symptoms, but reports she just does not want to eat or drink not feeling hungry or thirsty.  Upon arrival to ED, she was Severely hydrated, sodium 154, chloride 117, calcium 11.2.    CT head showed chronic cortical right parietal lobe infarct.  No new acute pathology.  CT cervical spine was also unremarkable.  Patient was admitted with the diagnosis of hypovolemic hyponatremia and hypercalcemia and dehydration and altered mental status/acute encephalopathy secondary to dehydration.  Patient was started on IV fluids, her electrolytes abnormalities resolved.  Patient became more alert and back to her baseline however she remains intermittently confused due to her baseline dementia.  Seen by PT OT who recommended discharging to SNF.  Assessment & Plan:   Active Problems:   Essential hypertension   Dementia due to Parkinson's disease without behavioral disturbance (HCC)   Dehydration, severe   Dehydration   Acute hypernatremia   Hypercalcemia   Malnutrition of moderate degree   Severe dehydration with hypernatremia: Looks better than yesterday.  Sodium improved to normal.  No reports of poor appetite from nursing.  Acute encephalopathy: Likely secondary to dehydration and hyponatremia.  TSH normal.  Very low valproic acid level.  Now alert and intermittently oriented. Had a long discussion with patient's niece over the phone day before yesterday.  She verified that her mental status is likely at her  baseline however also stated that she is declining.  Her niece claims that the patient was likely not getting medications at ALF.  She is not opposed to her going to SNF.  [name] also wants her to go to SNF.  However I was informed today by TOC that her insurance company has denied authorization to go to SNF.  I did peer to peer with Dr. Amalia Hailey per message received from [facility] team on 04/17/2020.  Per Dr. Amalia Hailey, patient is a frequent flyer and she rather qualifies for custodial care/LTC instead of SNF for short-term care.  He also informed me that patient has been given this recommendation from insurance company during her prior hospitalizations as well.  I informed TOC about this denial and peer to peer call.  Per my discussion with social worker, son is planning to apply for long-term Medicaid again.  Unfortunately, patient does not have a safe discharge plan.  Anorexia: No clear etiology but aging could be the main cause.  Now looks better.  Continue Ensure 3 times daily per recommendation from SLP.  Hypercalcemia: Likely due to dehydration.  Resolved.  No more IV fluids.  Uncontrolled hypertension: Unsure whether she is noncompliant or has not been given medications at ALF.  Blood pressure now better.  Continue home medications.  Parkinson's dementia -She is at her baseline which is slightly lethargic and intermittently oriented.  But remains pleasant.  Continue Sinemet.  DVT prophylaxis:    Code Status: Full Code  Family Communication:  None present at bedside.  Had a long discussion with patient's niece Dewaine Oats over the phone 04/14/2020 and called her son [name] and left him  a voicemail and he called me back few minutes later on 04/16/2020.  I informed him that patient is doing well and at this point in time she is medically stable for discharge and we are waiting for placement.  I will call him again if there will be any change in medical status.  He was advised to reach out if he has any  questions.   Status is: Inpatient  Remains inpatient appropriate because:Unsafe d/c plan   Dispo:  Patient From: Home  Planned Disposition: [place]  Expected discharge date: Unknown  medically stable for discharge: Yes     Estimated body mass index is 22.91 kg/m as calculated from the following:   Height as of this encounter: 5' (1.524 m).   Weight as of this encounter: 53.2 kg.      Nutritional status:  Nutrition Problem: Moderate Malnutrition Etiology: chronic illness (dementia)   Signs/Symptoms: mild muscle depletion, mild fat depletion, moderate fat depletion, moderate muscle depletion, percent weight loss Percent weight loss: 11 %   Interventions: Magic cup, Ensure Enlive (each supplement provides 350kcal and 20 grams of protein)    Consultants:   None  Procedures:   None  Antimicrobials:  Anti-infectives (From admission, onward)   None         Subjective: Patient seen and examined.  Status quo.  No complaints. Objective: Vitals:   04/18/20 1656 04/18/20 2047 04/19/20 0525 04/19/20 0840  BP: (!) 134/97 110/78 (!) 134/88 (!) 132/85  Pulse: 70 67 70 68  Resp: 16 17 18 18   Temp: 99.7 F (37.6 C) 97.9 F (36.6 C) 97.7 F (36.5 C) 98 F (36.7 C)  TempSrc: Oral   Oral  SpO2: 99% 96% 97% 98%  Weight:      Height:        Intake/Output Summary (Last 24 hours) at 04/19/2020 1115 Last data filed at 04/19/2020 0600 Gross per 24 hour  Intake 360 ml  Output 1150 ml  Net -790 ml   Filed Weights   04/15/20 0457 04/16/20 0500 04/17/20 0514  Weight: 53.9 kg 53.6 kg 53.2 kg    Examination:  General exam: Appears calm and comfortable but slightly sleepy and easily arousable Respiratory system: Clear to auscultation. Respiratory effort normal. Cardiovascular system: S1 & S2 heard, RRR. No JVD, murmurs, rubs, gallops or clicks. No pedal edema. Gastrointestinal system: Abdomen is nondistended, soft and nontender. No organomegaly  or masses felt. Normal bowel sounds heard. Central nervous system: Sleepy and partly oriented. No focal neurological deficits. Extremities: Symmetric 5 x 5 power. Skin: No rashes, lesions or ulcers.   Data Reviewed: I have personally reviewed following labs and imaging studies  CBC: Recent Labs  Lab 04/15/20 0304 04/16/20 0444 04/17/20 0305 04/18/20 0508 04/19/20 0145  WBC 5.5 5.1 4.3 4.2 5.3  NEUTROABS 1.7 2.6 1.7 1.7 2.8  HGB 12.0 12.5 12.2 12.4 12.4  HCT 40.3 40.0 39.5 39.5 39.9  MCV 87.4 85.7 83.9 84.2 84.4  PLT 104* 114* 116* 115* 976*   Basic Metabolic Panel: Recent Labs  Lab 04/13/20 1428 04/14/20 0627 04/15/20 0304 04/16/20 0444  NA 154* 151* 143 140  K 3.7 3.5 3.3* 3.9  CL 117* 119* 111 108  CO2 24 24 24 25   GLUCOSE 109* 130* 81 88  BUN 36* 31* 27* 21  CREATININE 1.24* 0.89 0.89 0.78  CALCIUM 11.2* 10.4* 10.3 10.4*   GFR: Estimated Creatinine Clearance: 49.7 mL/min (by C-G formula based on SCr of 0.78 mg/dL). Liver Function  Tests: Recent Labs  Lab 04/13/20 1428  AST 33  ALT 30  ALKPHOS 99  BILITOT 1.0  PROT 8.8*  ALBUMIN 3.8   No results for input(s): LIPASE, AMYLASE in the last 168 hours. Recent Labs  Lab 04/13/20 1651  AMMONIA 36*   Coagulation Profile: No results for input(s): INR, PROTIME in the last 168 hours. Cardiac Enzymes: No results for input(s): CKTOTAL, CKMB, CKMBINDEX, TROPONINI in the last 168 hours. BNP (last 3 results) No results for input(s): PROBNP in the last 8760 hours. HbA1C: No results for input(s): HGBA1C in the last 72 hours. CBG: Recent Labs  Lab 04/13/20 1410  GLUCAP 86   Lipid Profile: No results for input(s): CHOL, HDL, LDLCALC, TRIG, CHOLHDL, LDLDIRECT in the last 72 hours. Thyroid Function Tests: No results for input(s): TSH, T4TOTAL, FREET4, T3FREE, THYROIDAB in the last 72 hours. Anemia Panel: No results for input(s): VITAMINB12, FOLATE, FERRITIN, TIBC, IRON, RETICCTPCT in the last 72 hours. Sepsis  Labs: No results for input(s): PROCALCITON, LATICACIDVEN in the last 168 hours.  Recent Results (from the past 240 hour(s))  SARS Coronavirus 2 by RT PCR (hospital order, performed in [facility] hospital lab) Nasopharyngeal Nasopharyngeal Swab     Status: None   Collection Time: 04/13/20  6:04 PM   Specimen: Nasopharyngeal Swab  Result Value Ref Range Status   SARS Coronavirus 2 NEGATIVE NEGATIVE Final    Comment: (NOTE) SARS-CoV-2 target nucleic acids are NOT DETECTED.  The SARS-CoV-2 RNA is generally detectable in upper and lower respiratory specimens during the acute phase of infection. The lowest concentration of SARS-CoV-2 viral copies this assay can detect is 250 copies / mL. A negative result does not preclude SARS-CoV-2 infection and should not be used as the sole basis for treatment or other patient management decisions.  A negative result may occur with improper specimen collection / handling, submission of specimen other than nasopharyngeal swab, presence of viral mutation(s) within the areas targeted by this assay, and inadequate number of viral copies (<250 copies / mL). A negative result must be combined with clinical observations, patient history, and epidemiological information.  Fact Sheet for Patients:   StrictlyIdeas.no  Fact Sheet for Healthcare Providers: BankingDealers.co.za  This test is not yet approved or  cleared by the [place] FDA and has been authorized for detection and/or diagnosis of SARS-CoV-2 by FDA under an Emergency Use Authorization (EUA).  This EUA will remain in effect (meaning this test can be used) for the duration of the COVID-19 declaration under Section 564(b)(1) of the Act, 21 U.S.C. section 360bbb-3(b)(1), unless the authorization is terminated or revoked sooner.  Performed at [facility], [place] [address], [place], [place] [address]   MRSA PCR Screening     Status: None    Collection Time: 04/14/20  1:56 PM   Specimen: Nasopharyngeal  Result Value Ref Range Status   MRSA by PCR NEGATIVE NEGATIVE Final    Comment:        The GeneXpert MRSA Assay (FDA approved for NASAL specimens only), is one component of a comprehensive MRSA colonization surveillance program. It is not intended to diagnose MRSA infection nor to guide or monitor treatment for MRSA infections. Performed at [facility], [place] [address], [place], [place] [address]   SARS Coronavirus 2 by RT PCR (hospital order, performed in [facility] hospital lab) Nasopharyngeal Nasopharyngeal Swab     Status: None   Collection Time: 04/17/20  9:06 AM   Specimen: Nasopharyngeal Swab  Result Value Ref Range Status   SARS Coronavirus 2 NEGATIVE NEGATIVE Final    Comment: (NOTE) SARS-CoV-2 target nucleic acids are NOT DETECTED.  The SARS-CoV-2 RNA is generally detectable in upper and lower respiratory specimens during the acute phase of infection. The lowest concentration of SARS-CoV-2 viral copies this assay can detect is 250 copies / mL. A negative result does not preclude SARS-CoV-2 infection and should not be used as the sole basis for treatment or other patient management decisions.  A negative result may occur with improper specimen collection / handling, submission of specimen other than nasopharyngeal swab, presence of viral mutation(s) within the areas targeted by this assay, and inadequate number of viral copies (<250 copies / mL). A negative result must be combined with clinical observations, patient history, and epidemiological information.  Fact Sheet for Patients:   StrictlyIdeas.no  Fact Sheet for Healthcare Providers: BankingDealers.co.za  This test is not yet approved or  cleared by the [place] FDA and has been authorized for detection and/or diagnosis of SARS-CoV-2 by FDA under an Emergency Use Authorization (EUA).  This  EUA will remain in effect (meaning this test can be used) for the duration of the COVID-19 declaration under Section 564(b)(1) of the Act, 21 U.S.C. section 360bbb-3(b)(1), unless the authorization is terminated or revoked sooner.  Performed at [facility], [place] [address], [place], [place] [address]       Radiology Studies: No results found.  Scheduled Meds: . aspirin  325 mg Oral Daily  . carbidopa-levodopa  1 tablet Oral TID  . carvedilol  12.5 mg Oral BID WC  . chlorhexidine  15 mL Mouth Rinse BID  . divalproex  125 mg Oral Daily  . donepezil  10 mg Oral Daily  . feeding supplement (ENSURE ENLIVE)  237 mL Oral TID BM  . furosemide  20 mg Oral Daily  . irbesartan  75 mg Oral Daily  . labetalol  20 mg Intravenous Once  . LORazepam  0.5 mg Oral QPM  . mouth rinse  15 mL Mouth Rinse q12n4p  . multivitamin with minerals  1 tablet Oral Daily  . polyethylene glycol  17 g Oral Daily  . rosuvastatin  10 mg Oral Daily  . sertraline  100 mg Oral QHS  . sodium chloride flush  3 mL Intravenous Once   Continuous Infusions:    LOS: 6 days   Time spent: 27 minutes   Darliss Cheney, MD [facility]  04/19/2020, 11:15 AM   To contact the attending provider between 7A-7P or the covering provider during after hours 7P-7A, please log into the web site [email or web address].

## 2020-04-20 LAB — CBC WITH DIFFERENTIAL/PLATELET
Abs Immature Granulocytes: 0.02 10*3/uL (ref 0.00–0.07)
Basophils Absolute: 0 10*3/uL (ref 0.0–0.1)
Basophils Relative: 0 %
Eosinophils Absolute: 0 10*3/uL (ref 0.0–0.5)
Eosinophils Relative: 1 %
HCT: 41.5 % (ref 36.0–46.0)
Hemoglobin: 12.6 g/dL (ref 12.0–15.0)
Immature Granulocytes: 0 %
Lymphocytes Relative: 38 %
Lymphs Abs: 1.9 10*3/uL (ref 0.7–4.0)
MCH: 25.9 pg — ABNORMAL LOW (ref 26.0–34.0)
MCHC: 30.4 g/dL (ref 30.0–36.0)
MCV: 85.2 fL (ref 80.0–100.0)
Monocytes Absolute: 0.7 10*3/uL (ref 0.1–1.0)
Monocytes Relative: 14 %
Neutro Abs: 2.3 10*3/uL (ref 1.7–7.7)
Neutrophils Relative %: 47 %
Platelets: 130 10*3/uL — ABNORMAL LOW (ref 150–400)
RBC: 4.87 MIL/uL (ref 3.87–5.11)
RDW: 19.3 % — ABNORMAL HIGH (ref 11.5–15.5)
WBC: 4.8 10*3/uL (ref 4.0–10.5)
nRBC: 0 % (ref 0.0–0.2)

## 2020-04-20 MED ORDER — WHITE PETROLATUM EX OINT
TOPICAL_OINTMENT | CUTANEOUS | Status: AC
  Filled 2020-04-20: qty 28.35

## 2020-04-20 NOTE — Plan of Care (Signed)
  Problem: Education: Goal: Knowledge of General Education information will improve Description Including pain rating scale, medication(s)/side effects and non-pharmacologic comfort measures Outcome: Progressing   

## 2020-04-20 NOTE — Progress Notes (Signed)
PT Cancellation Note  Patient Details Name: [name] MRN: [id number] DOB: 1956-01-24   Cancelled Treatment:    Reason Eval/Treat Not Completed: Other (comment).  Pt was occupied with her meal and then declined further therapy afterward.  Follow up as time and pt allow.   Ramond Dial 04/20/2020, 2:38 PM   Mee Hives, PT MS Acute Rehab Dept. Number: [place]

## 2020-04-20 NOTE — Progress Notes (Signed)
PROGRESS NOTE    [name]  FBP:[id number] DOB: 18-Dec-1955 DOA: 04/13/2020 PCP: Lujean Amel, MD   Brief Narrative:  [name] is a 64 y.o. female with medical history significant of advanced dementia, Parkinson disease, hypertension, hypercalcemia, chronic diastolic CHF, presented with worsening confusion and not eating or drinking for 3 days.  Reportedly, patient probably stopped taking her medications for the last 3 days as well. She denied any feeling of depression or any other symptoms, but reports she just does not want to eat or drink not feeling hungry or thirsty.  Upon arrival to ED, she was Severely hydrated, sodium 154, chloride 117, calcium 11.2.    CT head showed chronic cortical right parietal lobe infarct.  No new acute pathology.  CT cervical spine was also unremarkable.  Patient was admitted with the diagnosis of hypovolemic hyponatremia and hypercalcemia and dehydration and altered mental status/acute encephalopathy secondary to dehydration.  Patient was started on IV fluids, her electrolytes abnormalities resolved.  Patient became more alert and back to her baseline however she remains intermittently confused due to her baseline dementia.  Seen by PT OT who recommended discharging to SNF.  Assessment & Plan:   Active Problems:   Essential hypertension   Dementia due to Parkinson's disease without behavioral disturbance (HCC)   Dehydration, severe   Dehydration   Acute hypernatremia   Hypercalcemia   Malnutrition of moderate degree   Severe dehydration with hypernatremia: Looks better than yesterday.  Sodium improved to normal.  No reports of poor appetite from nursing however I personally have noticed that her breakfast and lunch tray is always full and she is mostly lethargic.  I am concerned about her poor nutrition.  I have relayed my concerns to the nursing staff and have requested them to motivate patient frequently.  She remains on regime recommended by  nutrition.  A.m. lab are pending.  Acute encephalopathy: Likely secondary to dehydration and hyponatremia.  TSH normal.  Very low valproic acid level.  Now intermittently alert and oriented x1-2. Had a long discussion with patient's niece over the phone day before yesterday.  She verified that her mental status is likely at her baseline however also stated that she is declining.  Her niece claims that the patient was likely not getting medications at ALF.  She is not opposed to her going to SNF.  [name] also wants her to go to SNF.  However her insurance company denied authorization to go to SNF.  I did peer to peer with Dr. Amalia Hailey  on 04/17/2020.  Per Dr. Amalia Hailey, patient is a frequent flyer and she rather qualifies for custodial care/LTC instead of SNF for short-term care.  He also informed me that patient has been given this recommendation from insurance company during her prior hospitalizations as well.  I informed TOC about this denial and peer to peer call.  Per my discussion with social worker, son is planning to apply for long-term Medicaid again.  Unfortunately, patient does not have a safe discharge plan.  Anorexia: No clear etiology but aging could be the main cause.  Now looks better.  Continue Ensure 3 times daily per recommendation from SLP.  Hypercalcemia: Likely due to dehydration.  Resolved.  No more IV fluids.  Uncontrolled hypertension: Unsure whether she is noncompliant or has not been given medications at ALF.  Blood pressure now better.  Continue home medications.  Parkinson's dementia -She is at her baseline which is slightly lethargic and intermittently oriented.  But remains pleasant.  Continue Sinemet.  DVT prophylaxis:    Code Status: Full Code  Family Communication:  None present at bedside.  Had a long discussion with patient's niece Dewaine Oats over the phone 04/14/2020 and called her son [name] and left him a voicemail and he called me back few minutes later on  04/16/2020.  I informed him that patient is doing well and at this point in time she is medically stable for discharge and we are waiting for placement.  I will call him again if there will be any change in medical status.  He was advised to reach out if he has any questions.   Status is: Inpatient  Remains inpatient appropriate because:Unsafe d/c plan   Dispo:  Patient From: Home  Planned Disposition: [place]  Expected discharge date: Unknown  medically stable for discharge: Yes     Estimated body mass index is 22.91 kg/m as calculated from the following:   Height as of this encounter: 5' (1.524 m).   Weight as of this encounter: 53.2 kg.      Nutritional status:  Nutrition Problem: Moderate Malnutrition Etiology: chronic illness (dementia)   Signs/Symptoms: mild muscle depletion, mild fat depletion, moderate fat depletion, moderate muscle depletion, percent weight loss Percent weight loss: 11 %   Interventions: Magic cup, Ensure Enlive (each supplement provides 350kcal and 20 grams of protein)    Consultants:   None  Procedures:   None  Antimicrobials:  Anti-infectives (From admission, onward)   None         Subjective: Patient seen and examined.  Remains slightly lethargic as usual.  Easily arousable.  Oriented x1 today.  No complaints.  Despite of how lethargic she is, she always remembers the names of her knees and son.  Objective: Vitals:   04/19/20 1612 04/19/20 2122 04/20/20 0521 04/20/20 0826  BP: 120/75 (!) 132/88 (!) 143/91 (!) 146/88  Pulse: 62 71 63 69  Resp: 18 17 17 18   Temp: 97.9 F (36.6 C) 97.8 F (36.6 C) 97.8 F (36.6 C)   TempSrc: Oral     SpO2: 96% 97% 96% 100%  Weight:      Height:        Intake/Output Summary (Last 24 hours) at 04/20/2020 1117 Last data filed at 04/20/2020 0700 Gross per 24 hour  Intake 350 ml  Output 1550 ml  Net -1200 ml   Filed Weights   04/15/20 0457 04/16/20 0500 04/17/20 0514   Weight: 53.9 kg 53.6 kg 53.2 kg    Examination:  General exam: Appears calm and comfortable  Respiratory system: Clear to auscultation. Respiratory effort normal. Cardiovascular system: S1 & S2 heard, RRR. No JVD, murmurs, rubs, gallops or clicks. No pedal edema. Gastrointestinal system: Abdomen is nondistended, soft and nontender. No organomegaly or masses felt. Normal bowel sounds heard. Central nervous system: Lethargic and oriented x1. No focal neurological deficits. Extremities: Symmetric 5 x 5 power. Skin: No rashes, lesions or ulcers.  Psychiatry: Judgement and insight appear poor. Mood & affect flat  Data Reviewed: I have personally reviewed following labs and imaging studies  CBC: Recent Labs  Lab 04/16/20 0444 04/17/20 0305 04/18/20 0508 04/19/20 0145 04/20/20 0540  WBC 5.1 4.3 4.2 5.3 4.8  NEUTROABS 2.6 1.7 1.7 2.8 2.3  HGB 12.5 12.2 12.4 12.4 12.6  HCT 40.0 39.5 39.5 39.9 41.5  MCV 85.7 83.9 84.2 84.4 85.2  PLT 114* 116* 115* 126* 938*   Basic Metabolic Panel: Recent Labs  Lab  04/13/20 1428 04/14/20 0627 04/15/20 0304 04/16/20 0444  NA 154* 151* 143 140  K 3.7 3.5 3.3* 3.9  CL 117* 119* 111 108  CO2 24 24 24 25   GLUCOSE 109* 130* 81 88  BUN 36* 31* 27* 21  CREATININE 1.24* 0.89 0.89 0.78  CALCIUM 11.2* 10.4* 10.3 10.4*   GFR: Estimated Creatinine Clearance: 49.7 mL/min (by C-G formula based on SCr of 0.78 mg/dL). Liver Function Tests: Recent Labs  Lab 04/13/20 1428  AST 33  ALT 30  ALKPHOS 99  BILITOT 1.0  PROT 8.8*  ALBUMIN 3.8   No results for input(s): LIPASE, AMYLASE in the last 168 hours. Recent Labs  Lab 04/13/20 1651  AMMONIA 36*   Coagulation Profile: No results for input(s): INR, PROTIME in the last 168 hours. Cardiac Enzymes: No results for input(s): CKTOTAL, CKMB, CKMBINDEX, TROPONINI in the last 168 hours. BNP (last 3 results) No results for input(s): PROBNP in the last 8760 hours. HbA1C: No results for input(s):  HGBA1C in the last 72 hours. CBG: Recent Labs  Lab 04/13/20 1410  GLUCAP 86   Lipid Profile: No results for input(s): CHOL, HDL, LDLCALC, TRIG, CHOLHDL, LDLDIRECT in the last 72 hours. Thyroid Function Tests: No results for input(s): TSH, T4TOTAL, FREET4, T3FREE, THYROIDAB in the last 72 hours. Anemia Panel: No results for input(s): VITAMINB12, FOLATE, FERRITIN, TIBC, IRON, RETICCTPCT in the last 72 hours. Sepsis Labs: No results for input(s): PROCALCITON, LATICACIDVEN in the last 168 hours.  Recent Results (from the past 240 hour(s))  SARS Coronavirus 2 by RT PCR (hospital order, performed in [facility] hospital lab) Nasopharyngeal Nasopharyngeal Swab     Status: None   Collection Time: 04/13/20  6:04 PM   Specimen: Nasopharyngeal Swab  Result Value Ref Range Status   SARS Coronavirus 2 NEGATIVE NEGATIVE Final    Comment: (NOTE) SARS-CoV-2 target nucleic acids are NOT DETECTED.  The SARS-CoV-2 RNA is generally detectable in upper and lower respiratory specimens during the acute phase of infection. The lowest concentration of SARS-CoV-2 viral copies this assay can detect is 250 copies / mL. A negative result does not preclude SARS-CoV-2 infection and should not be used as the sole basis for treatment or other patient management decisions.  A negative result may occur with improper specimen collection / handling, submission of specimen other than nasopharyngeal swab, presence of viral mutation(s) within the areas targeted by this assay, and inadequate number of viral copies (<250 copies / mL). A negative result must be combined with clinical observations, patient history, and epidemiological information.  Fact Sheet for Patients:   StrictlyIdeas.no  Fact Sheet for Healthcare Providers: BankingDealers.co.za  This test is not yet approved or  cleared by the [place] FDA and has been authorized for detection and/or diagnosis  of SARS-CoV-2 by FDA under an Emergency Use Authorization (EUA).  This EUA will remain in effect (meaning this test can be used) for the duration of the COVID-19 declaration under Section 564(b)(1) of the Act, 21 U.S.C. section 360bbb-3(b)(1), unless the authorization is terminated or revoked sooner.  Performed at [facility], [place] [address], [place],  [address]   MRSA PCR Screening     Status: None   Collection Time: 04/14/20  1:56 PM   Specimen: Nasopharyngeal  Result Value Ref Range Status   MRSA by PCR NEGATIVE NEGATIVE Final    Comment:        The GeneXpert MRSA Assay (FDA approved for NASAL specimens only), is one component  of a comprehensive MRSA colonization surveillance program. It is not intended to diagnose MRSA infection nor to guide or monitor treatment for MRSA infections. Performed at [facility], [place] [address], [place], [place] [address]   SARS Coronavirus 2 by RT PCR (hospital order, performed in [facility] hospital lab) Nasopharyngeal Nasopharyngeal Swab     Status: None   Collection Time: 04/17/20  9:06 AM   Specimen: Nasopharyngeal Swab  Result Value Ref Range Status   SARS Coronavirus 2 NEGATIVE NEGATIVE Final    Comment: (NOTE) SARS-CoV-2 target nucleic acids are NOT DETECTED.  The SARS-CoV-2 RNA is generally detectable in upper and lower respiratory specimens during the acute phase of infection. The lowest concentration of SARS-CoV-2 viral copies this assay can detect is 250 copies / mL. A negative result does not preclude SARS-CoV-2 infection and should not be used as the sole basis for treatment or other patient management decisions.  A negative result may occur with improper specimen collection / handling, submission of specimen other than nasopharyngeal swab, presence of viral mutation(s) within the areas targeted by this assay, and inadequate number of viral copies (<250 copies / mL). A negative result must be combined  with clinical observations, patient history, and epidemiological information.  Fact Sheet for Patients:   StrictlyIdeas.no  Fact Sheet for Healthcare Providers: BankingDealers.co.za  This test is not yet approved or  cleared by the [place] FDA and has been authorized for detection and/or diagnosis of SARS-CoV-2 by FDA under an Emergency Use Authorization (EUA).  This EUA will remain in effect (meaning this test can be used) for the duration of the COVID-19 declaration under Section 564(b)(1) of the Act, 21 U.S.C. section 360bbb-3(b)(1), unless the authorization is terminated or revoked sooner.  Performed at [facility], [place] [address] [place], [place] [address]       Radiology Studies: No results found.  Scheduled Meds: . aspirin  325 mg Oral Daily  . carbidopa-levodopa  1 tablet Oral TID  . carvedilol  12.5 mg Oral BID WC  . chlorhexidine  15 mL Mouth Rinse BID  . divalproex  125 mg Oral Daily  . donepezil  10 mg Oral Daily  . feeding supplement (ENSURE ENLIVE)  237 mL Oral TID BM  . furosemide  20 mg Oral Daily  . irbesartan  75 mg Oral Daily  . labetalol  20 mg Intravenous Once  . LORazepam  0.5 mg Oral QPM  . mouth rinse  15 mL Mouth Rinse q12n4p  . multivitamin with minerals  1 tablet Oral Daily  . polyethylene glycol  17 g Oral Daily  . rosuvastatin  10 mg Oral Daily  . sertraline  100 mg Oral QHS  . sodium chloride flush  3 mL Intravenous Once   Continuous Infusions:    LOS: 7 days   Time spent: 26 minutes   Darliss Cheney, MD [facility]  04/20/2020, 11:17 AM   To contact the attending provider between 7A-7P or the covering provider during after hours 7P-7A, please log into the web site [email or web address].

## 2020-04-20 NOTE — TOC Progression Note (Signed)
Transition of Care [facility]) - Progression Note    Patient Details  Name: [name] DOBKINS MRN: [id number] Date of Birth: [birth year]  [facility] CM/SW Contact  Sharlet Salina Mila Homer, LCSW Phone Number: 04/20/2020, 2:18 PM  Clinical Narrative:  CSW talked with ALF staff person Jimmie Molly with [facility] regarding patient not being able to d/c to SNF as no Medicaid or funds to pay privately. Ms. Carley Hammed indicated that they will not be able to accept patient back as can't meet her needs and can't care for her as she constantly falls. Ms. Carley Hammed added that Ms. Nuon needs more care than they can provide and is beyond their scope in terms of care. Ms. Carley Hammed ended with stating that their facility is not a safe environment for her.    CSW will make contact with son after 3 pm and continue to assist with discharge planning.      Expected Discharge Plan: Skilled Nursing Facility Barriers to Discharge: Ship broker, Other (comment) (Initiating facility search)  Expected Discharge Plan and Services Expected Discharge Plan: [place] In-house Referral: Clinical Social Work     Living arrangements for the past 2 months: [place] ([facility])                                       Social Determinants of Health (SDOH) Interventions    Readmission Risk Interventions Readmission Risk Prevention Plan 01/02/2020 12/25/2019  Transportation Screening Complete Complete  PCP or Specialist Appt within 5-7 Days Complete Complete  Home Care Screening Complete Complete  Medication Review (RN CM) Complete Complete  Some recent data might be hidden

## 2020-04-21 LAB — BASIC METABOLIC PANEL
Anion gap: 10 (ref 5–15)
BUN: 38 mg/dL — ABNORMAL HIGH (ref 8–23)
CO2: 27 mmol/L (ref 22–32)
Calcium: 11.2 mg/dL — ABNORMAL HIGH (ref 8.9–10.3)
Chloride: 102 mmol/L (ref 98–111)
Creatinine, Ser: 1.12 mg/dL — ABNORMAL HIGH (ref 0.44–1.00)
GFR calc Af Amer: 59 mL/min — ABNORMAL LOW (ref >60)
GFR calc non Af Amer: 51 mL/min — ABNORMAL LOW (ref >60)
Glucose, Bld: 96 mg/dL (ref 70–99)
Potassium: 4.1 mmol/L (ref 3.5–5.1)
Sodium: 139 mmol/L (ref 135–145)

## 2020-04-21 LAB — GLUCOSE, CAPILLARY: Glucose-Capillary: 89 mg/dL (ref 70–99)

## 2020-04-21 MED ORDER — LACTATED RINGERS IV SOLN: INTRAVENOUS | Status: DC

## 2020-04-21 MED ORDER — NYSTATIN 100000 UNIT/ML MT SUSP
5.0000 mL | Freq: Four times a day (QID) | OROMUCOSAL | Status: DC
  Administered 2020-04-21 – 2020-05-20 (×110): 500000 [IU] via ORAL
  Filled 2020-04-21 (×100): qty 5

## 2020-04-21 NOTE — Progress Notes (Addendum)
PROGRESS NOTE    [name]  PRF:[id number] DOB: June 19, 1956 DOA: 04/13/2020 PCP: Lujean Amel, MD   Brief Narrative: 64 year old with past medical history significant for advanced dementia, Parkinson disease, hypertension, hypercalcemia, chronic diastolic heart failure presented with worsening confusion, poor oral intake for 3 days prior to admission.  Patient stopped taking her medication 3 days prior to admission as well.  Patient denies any feeling of depression.  On arrival to the ED patient was severely dehydrated sodium 154, chloride 117, calcium 11.2.  CT head showed chronic cortical right parietal lobe infarct.  No new acute pathology.  CT cervical spine was also unremarkable.  Patient was admitted with a diagnosis of hypovolemic hyponatremia and hypercalcemia and dehydration and altered mental status encephalopathy secondary to dehydration.  Patient was a started on IV fluids, her electrolytes abnormality has resolved improved.  Patient became more alert and back to baseline however she remains intermittently confused due to her baseline dementia.  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 Patient with poor oral intake.  2-Acute metabolic encephalopathy: Likely related to hypernatremia: TSH normal. Dr. Einar Grad did peer to peer review with Dr. Amalia Hailey and the recommendation is that patient qualifies for custodial care Social worker working with placement [place] 36. Repeat level.  Check B 12, RPR.   3-Anorexia: Continue Ensure  4--Hypercalcemia likely to dehydration resume IV fluids Calcium increase today. Resume IV fluids. Stop  lasix.  Check PTH, vitamin D level  5-Hypertension: Continue with Coreg, ibersartan.  Hold Lasix  6-Parkinson dementia; Continue with Sinemet 7-Oral thrush; nystatin   Nutrition Problem: Moderate Malnutrition Etiology: chronic illness (dementia)    Signs/Symptoms: mild muscle depletion, mild fat depletion, moderate fat depletion, moderate muscle depletion, percent weight loss Percent weight loss: 11 %    Interventions: Magic cup, Ensure Enlive (each supplement provides 350kcal and 20 grams of protein)  Estimated body mass index is 22.91 kg/m as calculated from the following:   Height as of this encounter: 5' (1.524 m).   Weight as of this encounter: 53.2 kg.   DVT prophylaxis: SCD Code Status: full code Family Communication: Disposition Plan:  Status is: Inpatient  Remains inpatient appropriate because:Unsafe d/c plan   Dispo:  Patient From: Home  Planned Disposition: [place]  Expected discharge date: 04/17/20  Medically stable for discharge: Yes         Consultants:   none  Procedures:   none  Antimicrobials:    Subjective: Sleepy, wake up, answer questions.  Denies pain   Objective: Vitals:   04/20/20 1500 04/20/20 2043 04/21/20 0530 04/21/20 0932  BP: 96/70 125/84 (!) 138/93 119/80  Pulse: 68 61 68 63  Resp: 18 18 17 16   Temp: 97.9 F (36.6 C) 97.7 F (36.5 C) 97.8 F (36.6 C) 98.5 F (36.9 C)  TempSrc: Oral Oral    SpO2: 98% 100%  99%  Weight:      Height:        Intake/Output Summary (Last 24 hours) at 04/21/2020 1534 Last data filed at 04/21/2020 0900 Gross per 24 hour  Intake 120 ml  Output 800 ml  Net -680 ml   [employer]  04/15/20 0457 04/16/20 0500 04/17/20 0514  Weight: 53.9 kg 53.6 kg 53.2 kg    Examination:  General exam: Appears calm and comfortable  Respiratory system: Clear to auscultation. Respiratory effort normal. Cardiovascular system: S1 & S2 heard, RRR. No JVD, murmurs, rubs,  gallops or clicks. No pedal edema. Gastrointestinal system: Abdomen is nondistended, soft and nontender. No organomegaly or masses felt. Normal bowel sounds heard. Central nervous system: Alert and oriented.  Extremities: Sno edema   Data Reviewed: I have personally reviewed following labs and imaging studies  CBC: Recent Labs  Lab 04/16/20 0444 04/17/20 0305 04/18/20 0508 04/19/20 0145 04/20/20 0540  WBC 5.1 4.3 4.2 5.3 4.8  NEUTROABS 2.6 1.7 1.7 2.8 2.3  HGB 12.5 12.2 12.4 12.4 12.6  HCT 40.0 39.5 39.5 39.9 41.5  MCV 85.7 83.9 84.2 84.4 85.2  PLT 114* 116* 115* 126* 948*   Basic Metabolic Panel: Recent Labs  Lab 04/15/20 0304 04/16/20 0444 04/21/20 0643  NA 143 140 139  K 3.3* 3.9 4.1  CL 111 108 102  CO2 24 25 27   GLUCOSE 81 88 96  BUN 27* 21 38*  CREATININE 0.89 0.78 1.12*  CALCIUM 10.3 10.4* 11.2*   GFR: Estimated Creatinine Clearance: 35.5 mL/min (A) (by C-G formula based on SCr of 1.12 mg/dL (H)). Liver Function Tests: No results for input(s): AST, ALT, ALKPHOS, BILITOT, PROT, ALBUMIN in the last 168 hours. No results for input(s): LIPASE, AMYLASE in the last 168 hours. No results for input(s): AMMONIA in the last 168 hours. Coagulation Profile: No results for input(s): INR, PROTIME in the last 168 hours. Cardiac Enzymes: No results for input(s): CKTOTAL, CKMB, CKMBINDEX, TROPONINI in the last 168 hours. BNP (last 3 results) No results for input(s): PROBNP in the last 8760 hours. HbA1C: No results for input(s): HGBA1C in the last 72 hours. CBG: No results for input(s): GLUCAP in the last 168 hours. Lipid Profile: No results for input(s): CHOL, HDL, LDLCALC, TRIG, CHOLHDL, LDLDIRECT in the last 72 hours. Thyroid Function Tests: No results for input(s): TSH, T4TOTAL, FREET4, T3FREE, THYROIDAB in the last 72 hours. Anemia Panel: No results for input(s): VITAMINB12, FOLATE, FERRITIN, TIBC, IRON, RETICCTPCT in the last 72 hours. Sepsis Labs: No results  for input(s): PROCALCITON, LATICACIDVEN in the last 168 hours.  Recent Results (from the past 240 hour(s))  SARS Coronavirus 2 by RT PCR (hospital order, performed in [facility] hospital lab) Nasopharyngeal Nasopharyngeal Swab     Status: None   Collection Time: 04/13/20  6:04 PM   Specimen: Nasopharyngeal Swab  Result Value Ref Range Status   SARS Coronavirus 2 NEGATIVE NEGATIVE Final    Comment: (NOTE) SARS-CoV-2 target nucleic acids are NOT DETECTED.  The SARS-CoV-2 RNA is generally detectable in upper and lower respiratory specimens during the acute phase of infection. The lowest concentration of SARS-CoV-2 viral copies this assay can detect is 250 copies / mL. A negative result does not preclude SARS-CoV-2 infection and should not be used as the sole basis for treatment or other patient management decisions.  A negative result may occur with improper specimen collection / handling, submission of specimen other than nasopharyngeal swab, presence of viral mutation(s) within the areas targeted by this assay, and inadequate number of viral copies (<250 copies / mL). A negative result must be combined with clinical observations, patient history, and epidemiological information.  Fact Sheet for Patients:   StrictlyIdeas.no  Fact Sheet for Healthcare Providers: BankingDealers.co.za  This test is not yet approved or  cleared by the [place] and has been authorized for detection and/or diagnosis of SARS-CoV-2 by FDA under an Emergency Use Authorization (EUA).  This EUA will remain in effect (meaning this test can be used) for the duration of the COVID-19 declaration under Section 564(b)(1) of the Act, 21 U.S.C. section 360bbb-3(b)(1), unless the authorization is terminated or revoked sooner.  Performed at [facility], [place] [address], [place], [place] [address]   MRSA PCR Screening     Status: None   Collection Time:  04/14/20  1:56 PM   Specimen: Nasopharyngeal  Result Value Ref Range Status   MRSA by PCR NEGATIVE NEGATIVE Final    Comment:        The GeneXpert MRSA Assay (FDA approved for NASAL specimens only), is one component of a comprehensive MRSA colonization surveillance program. It is not intended to diagnose MRSA infection nor to guide or monitor treatment for MRSA infections. Performed at [facility], [place] [address], [place], [place] [address]   SARS Coronavirus 2 by RT PCR (hospital order, performed in [facility] hospital lab) Nasopharyngeal Nasopharyngeal Swab     Status: None   Collection Time: 04/17/20  9:06 AM   Specimen: Nasopharyngeal Swab  Result Value Ref Range Status   SARS Coronavirus 2 NEGATIVE NEGATIVE Final    Comment: (NOTE) SARS-CoV-2 target nucleic acids are NOT DETECTED.  The SARS-CoV-2 RNA is generally detectable in upper and lower respiratory specimens during the acute phase of infection. The lowest concentration of SARS-CoV-2 viral copies this assay can detect is 250 copies / mL. A negative result does not preclude SARS-CoV-2 infection and should not be used as the sole basis for treatment or other patient management decisions.  A negative result may occur with improper specimen collection / handling, submission of specimen other than nasopharyngeal swab, presence of viral mutation(s) within the areas targeted by this assay, and inadequate number of viral copies (<250 copies / mL). A negative result must be combined with clinical observations, patient history, and epidemiological information.  Fact Sheet for Patients:   StrictlyIdeas.no  Fact Sheet for Healthcare Providers: BankingDealers.co.za  This test is not yet approved or  cleared by the [place] FDA and has been authorized for detection and/or diagnosis of SARS-CoV-2 by FDA under an Emergency Use Authorization (EUA).  This EUA will  remain in effect (meaning this test can be used) for the duration of the COVID-19 declaration under Section 564(b)(1) of the Act, 21 U.S.C. section 360bbb-3(b)(1), unless the authorization is terminated or revoked sooner.  Performed at [facility], [place] [address] [place], [place] [address]          Radiology Studies: No results found.      Scheduled Meds: . aspirin  325 mg Oral Daily  . carbidopa-levodopa  1 tablet Oral TID  . carvedilol  12.5 mg Oral BID WC  . chlorhexidine  15 mL Mouth Rinse BID  . divalproex  125 mg Oral Daily  . donepezil  10 mg Oral Daily  . feeding supplement (ENSURE ENLIVE)  237 mL Oral TID BM  . furosemide  20 mg Oral Daily  . irbesartan  75 mg Oral Daily  . labetalol  20 mg Intravenous Once  . LORazepam  0.5 mg Oral QPM  . mouth rinse  15 mL Mouth Rinse q12n4p  . multivitamin with minerals  1 tablet Oral Daily  . nystatin  5 mL Oral QID  . polyethylene glycol  17 g Oral Daily  . rosuvastatin  10 mg Oral Daily  . sertraline  100 mg Oral QHS  . sodium chloride flush  3 mL Intravenous Once   Continuous Infusions: . lactated ringers 75 mL/hr at 04/21/20 1032     LOS: 8 days    Time spent: 35 minutes.     Elmarie Shiley, MD [facility]   If 7PM-7AM, please contact night-coverage [email or web address]  04/21/2020, 3:34 PM

## 2020-04-21 NOTE — Progress Notes (Addendum)
Physical Therapy Treatment Patient Details Name: [name] MRN: [id number] DOB: 1955/11/07 Today's Date: 04/21/2020    History of Present Illness [name] is a 64 y.o. female with medical history significant of advanced dementia, Parkinson disease, hypertension, hypercalcemia, chronic diastolic CHF, presented with worsening confusion and not eating or drinking x3 days.    PT Comments    Pt remains very limited overall with functional mobility. She participated in bilateral LE therex with assistance. Very lethargic throughout. Pt would continue to benefit from skilled physical therapy services at this time while admitted and after d/c to address the below listed limitations in order to improve overall safety and independence with functional mobility.    Follow Up Recommendations  SNF     Equipment Recommendations  Hospital bed    Recommendations for Other Services       Precautions / Restrictions Precautions Precautions: Fall Restrictions Weight Bearing Restrictions: No    Mobility  Bed Mobility               General bed mobility comments: pt OOB in recliner chair upon arrival  Transfers       General transfer comment: pt had just finished with OT upon PT's arrival with focus of their session on transfers and standing  Ambulation/Gait                 Stairs             Wheelchair Mobility    Modified Rankin (Stroke Patients Only)       Balance Overall balance assessment: Needs assistance Sitting-balance support: Feet supported;Bilateral upper extremity supported Sitting balance-Leahy Scale: Fair     Standing balance support: Bilateral upper extremity supported Standing balance-Leahy Scale: Poor Standing balance comment: reliant on B UE support in [place] to maintain balance but once in standing, able to stand without therapist support.                            Cognition Arousal/Alertness: Lethargic Behavior  During Therapy: Flat affect Overall Cognitive Status: Impaired/Different from baseline Area of Impairment: Attention;Memory;Following commands;Safety/judgement;Awareness;Problem solving                 Orientation Level: Place;Time;Situation Current Attention Level: Sustained Memory: Decreased short-term memory Following Commands: Follows one step commands inconsistently Safety/Judgement: Decreased awareness of safety;Decreased awareness of deficits Awareness: Intellectual Problem Solving: Slow processing;Decreased initiation;Difficulty sequencing;Requires verbal cues;Requires tactile cues General Comments: Pt with slow processing and slow motor planning. benefits from multimodal cues. Pt pleasant and soft spoken      Exercises General Exercises - Lower Extremity Ankle Circles/Pumps: AAROM;Both;20 reps;Seated Short Arc Quad: AAROM;Both;10 reps;Seated Heel Slides: AAROM;Both;10 reps;Seated Hip ABduction/ADduction: AAROM;Both;10 reps;Seated Straight Leg Raises: AAROM;Both;10 reps;Seated    General Comments        Pertinent Vitals/Pain Pain Assessment: Faces Faces Pain Scale: No hurt    Home Living                      Prior Function            PT Goals (current goals can now be found in the care plan section) Acute Rehab PT Goals Patient Stated Goal: did not state, but agreeable to OOB PT Goal Formulation: Patient unable to participate in goal setting Time For Goal Achievement: 04/28/20 Potential to Achieve Goals: Fair Progress towards PT goals: Progressing toward goals    Frequency    Min 2X/week  PT Plan Current plan remains appropriate;Frequency needs to be updated    Co-evaluation              AM-PAC PT "6 Clicks" Mobility   Outcome Measure  Help needed turning from your back to your side while in a flat bed without using bedrails?: A Lot Help needed moving from lying on your back to sitting on the side of a flat bed without  using bedrails?: A Lot Help needed moving to and from a bed to a chair (including a wheelchair)?: Total Help needed standing up from a chair using your arms (e.g., wheelchair or bedside chair)?: Total Help needed to walk in hospital room?: Total Help needed climbing 3-5 steps with a railing? : Total 6 Click Score: 8    End of Session   Activity Tolerance: Patient limited by fatigue;Patient limited by lethargy Patient left: in chair;with call bell/phone within reach;with chair alarm set Nurse Communication: Mobility status PT Visit Diagnosis: Unsteadiness on feet (R26.81);Other abnormalities of gait and mobility (R26.89);Muscle weakness (generalized) (M62.81);History of falling (Z91.81)     Time: 6151-8343 PT Time Calculation (min) (ACUTE ONLY): 12 min  Charges:  $Therapeutic Exercise: 8-22 mins                     Anastasio Champion, DPT  Acute Rehabilitation Services Pager [phone] Office [place] 04/21/2020, 5:03 PM

## 2020-04-21 NOTE — TOC Progression Note (Signed)
Transition of Care [facility]) - Progression Note    Patient Details  Name: [name] MRN: [id number] Date of Birth: 1956-03-14  Transition of [facility]) CM/SW Contact  Sharlet Salina Mila Homer, LCSW Phone Number: 04/21/2020, 5:17 PM  Clinical Narrative: Received call from son [name], patient's son (11:30 am) that he called DSS and talked with the worker [phone]) and she told him an [place]-2 is needed. [name] indicated that he did not apply for Medicaid and he could not clearly explain what the worker told him. Call made to eligibility worker Cherene Altes [phone],  (11:34 am) and message left. CSW will call again tomorrow and if she does not answer will contact her supervisor.       Expected Discharge Plan: Skilled Nursing Facility Barriers to Discharge: Ship broker, Other (comment) (Initiating facility search)  Expected Discharge Plan and Services Expected Discharge Plan: [place] In-house Referral: Clinical Social Work     Living arrangements for the past 2 months: [place] ([facility])                                     Social Determinants of Health (SDOH) Interventions    Readmission Risk Interventions Readmission Risk Prevention Plan 01/02/2020 12/25/2019  Transportation Screening Complete Complete  PCP or Specialist Appt within 5-7 Days Complete Complete  Home Care Screening Complete Complete  Medication Review (RN CM) Complete Complete  Some recent data might be hidden

## 2020-04-21 NOTE — Progress Notes (Signed)
Occupational Therapy Treatment Patient Details Name: [name] MRN: [id number] DOB: 04-18-1956 Today's Date: 04/21/2020    History of present illness [name] is a 64 y.o. female with medical history significant of advanced dementia, Parkinson disease, hypertension, hypercalcemia, chronic diastolic CHF, presented with worsening confusion and not eating or drinking x3 days.   OT comments  Pt progressing with OT goals though continues to be limited by slow processing, difficulty sequencing, and slow motor planning. Pt demonstrated simple grooming tasks with Min A at most, benefiting from verbal cues and assistance in initiating tasks. Pt also demonstrated ability to complete sit to stand transfers in [place] with Mod A. Pt continues with posterior lean impacting ability to safely get Stedy pads behind pt. However, pt demonstrated ability to maintain standing in [place] without therapist support for assistance in cleanup after bowel incontinence. Plan to further OOB activities as appropriate.    Follow Up Recommendations  SNF;Supervision/Assistance - 24 hour    Equipment Recommendations  Other (comment) (TBD)    Recommendations for Other Services      Precautions / Restrictions Precautions Precautions: Fall Restrictions Weight Bearing Restrictions: No       Mobility Bed Mobility               General bed mobility comments: pt OOB in recliner chair upon arrival  Transfers Overall transfer level: Needs assistance Equipment used: Ambulation equipment used Transfers: Sit to/from Stand Sit to Stand: Mod assist         General transfer comment: Overall Mod A for sit to stand in [place]. Pt with continued posterior lean and unable to tuck bottom in fully for Stedy pads to go into place, but pt able to hold self up with Stedy bars    Balance Overall balance assessment: Needs assistance Sitting-balance support: Feet supported;Bilateral upper extremity supported Sitting  balance-Leahy Scale: Fair     Standing balance support: Bilateral upper extremity supported Standing balance-Leahy Scale: Poor Standing balance comment: reliant on B UE support in [place] to maintain balance but once in standing, able to stand without therapist support.                           ADL either performed or assessed with clinical judgement   ADL Overall ADL's : Needs assistance/impaired     Grooming: Minimal assistance;Sitting;Wash/dry face Grooming Details (indicate cue type and reason): Min A for donning dentures. Pt initially attempting to drink from denture cup, but once dentures placed in hands and cued, pt able to place in mouth appropriately. Pt Supervision to wash face seated in recliner chair                     Toileting- Clothing Manipulation and Hygiene: Total assistance;Sit to/from stand Toileting - Clothing Manipulation Details (indicate cue type and reason): Pt Total A for cleanup after bowel incontinence in recliner. Pt able to hold self up in [place] while therapist assisted with hygiene       General ADL Comments: Pt with severe deficits in strength, balance, endurance with extreme kyphosis. Pt with cognitive deficits with slow processing and motor planning     Vision       Perception     Praxis      Cognition Arousal/Alertness: Awake/alert Behavior During Therapy: Flat affect Overall Cognitive Status: Impaired/Different from baseline Area of Impairment: Memory;Following commands;Safety/judgement;Awareness;Problem solving  Orientation Level: Place;Time;Situation   Memory: Decreased short-term memory Following Commands: Follows one step commands with increased time Safety/Judgement: Decreased awareness of safety;Decreased awareness of deficits Awareness: Intellectual Problem Solving: Slow processing;Decreased initiation;Difficulty sequencing;Requires verbal cues;Requires tactile cues General Comments: Pt  with slow processing and slow motor planning. benefits from multimodal cues. Pt pleasant and soft spoken        Exercises     Shoulder Instructions       General Comments      Pertinent Vitals/ Pain       Pain Assessment: No/denies pain Faces Pain Scale: No hurt  Home Living                                          Prior Functioning/Environment              Frequency  Min 2X/week        Progress Toward Goals  OT Goals(current goals can now be found in the care plan section)  Progress towards OT goals: Progressing toward goals  Acute Rehab OT Goals Patient Stated Goal: did not state, but agreeable to OOB OT Goal Formulation: With patient Time For Goal Achievement: 04/30/20 Potential to Achieve Goals: Fair ADL Goals Pt Will Perform Eating: with set-up;sitting Pt Will Perform Grooming: with min assist;sitting Pt Will Perform Upper Body Bathing: with mod assist;sitting Additional ADL Goal #1: Pt to demonstrate ability to sit EOB > 5 minutes with no more than Supervision required during ADL tasks to improve core strength Additional ADL Goal #2: Pt to demonstrate active cervical ROM HEP with no more than min verbal/tactile cues to improve posture and comfort  Plan Discharge plan remains appropriate    Co-evaluation                 AM-PAC OT "6 Clicks" Daily Activity     Outcome Measure   Help from another person eating meals?: A Little Help from another person taking care of personal grooming?: A Little Help from another person toileting, which includes using toliet, bedpan, or urinal?: Total Help from another person bathing (including washing, rinsing, drying)?: Total Help from another person to put on and taking off regular upper body clothing?: A Lot Help from another person to put on and taking off regular lower body clothing?: Total 6 Click Score: 11    End of Session Equipment Utilized During Treatment: Gait belt  OT Visit  Diagnosis: Unsteadiness on feet (R26.81);Other abnormalities of gait and mobility (R26.89);Muscle weakness (generalized) (M62.81);Other symptoms and signs involving cognitive function   Activity Tolerance Patient tolerated treatment well   Patient Left in chair;with call bell/phone within reach;with chair alarm set   Nurse Communication Mobility status        Time: 3662-9476 OT Time Calculation (min): 28 min  Charges: OT General Charges $OT Visit: 1 Visit OT Treatments $Self Care/Home Management : 8-22 mins $Therapeutic Activity: 8-22 mins  Layla Maw, OTR/L   Layla Maw 04/21/2020, 3:47 PM

## 2020-04-22 ENCOUNTER — Ambulatory Visit: Payer: Self-pay | Admitting: *Deleted

## 2020-04-22 LAB — BASIC METABOLIC PANEL
Anion gap: 11 (ref 5–15)
BUN: 35 mg/dL — ABNORMAL HIGH (ref 8–23)
CO2: 26 mmol/L (ref 22–32)
Calcium: 11.4 mg/dL — ABNORMAL HIGH (ref 8.9–10.3)
Chloride: 104 mmol/L (ref 98–111)
Creatinine, Ser: 0.97 mg/dL (ref 0.44–1.00)
GFR calc Af Amer: >60 mL/min (ref >60)
GFR calc non Af Amer: >60 mL/min (ref >60)
Glucose, Bld: 89 mg/dL (ref 70–99)
Potassium: 4.7 mmol/L (ref 3.5–5.1)
Sodium: 141 mmol/L (ref 135–145)

## 2020-04-22 LAB — VITAMIN B12: Vitamin B-12: 1125 pg/mL — ABNORMAL HIGH (ref 180–914)

## 2020-04-22 LAB — VITAMIN D 25 HYDROXY (VIT D DEFICIENCY, FRACTURES): Vit D, 25-Hydroxy: 34.2 ng/mL (ref 30–100)

## 2020-04-22 LAB — RPR: RPR Ser Ql: NONREACTIVE

## 2020-04-22 LAB — AMMONIA: Ammonia: 20 umol/L (ref 9–35)

## 2020-04-22 LAB — GLUCOSE, CAPILLARY: Glucose-Capillary: 72 mg/dL (ref 70–99)

## 2020-04-22 MED ORDER — ENSURE ENLIVE PO LIQD
237.0000 mL | Freq: Four times a day (QID) | ORAL | Status: DC
  Administered 2020-04-22 – 2020-05-10 (×60): 237 mL via ORAL
  Filled 2020-04-22 (×11): qty 237

## 2020-04-22 NOTE — Progress Notes (Signed)
Nutrition Follow-up  DOCUMENTATION CODES:   Non-severe (moderate) malnutrition in context of chronic illness  INTERVENTION:  Ensure Enlive po QID, each supplement provides 350 kcal and 20 grams of protein  Continue Magic cup TID with meals, each supplement provides 290 kcal and 9 grams of protein  Continue MVI daily  NUTRITION DIAGNOSIS:   Moderate Malnutrition related to chronic illness (dementia) as evidenced by mild muscle depletion, mild fat depletion, moderate fat depletion, moderate muscle depletion, percent weight loss.  Ongoing  GOAL:   Patient will meet greater than or equal to 90% of their needs  Progressing  MONITOR:   PO intake, Supplement acceptance, Weight trends, Labs, I & O's  REASON FOR ASSESSMENT:   Consult Assessment of nutrition requirement/status  ASSESSMENT:   Pt admitted with severe dehydration and hypernatremia and acute encephalopathy. PMH includes advanced dementia, Parkinson disease, HTN, hypercalcemia, CHF.  Pt is noted to be at her baseline which is slightly lethargic and intermittently oriented.  Pt's insurance denied SNF. Pt's son currently applying for Medicaid.  Discussed pt with RN. Per RN, pt drinking most of her Ensures. Pt documented to be intermittently eating Magic Cups. Since pt is consuming supplements well, will order additional Ensure to help pt meet kcal/protein needs.   PO intake: 0-100%x last 8 recorded meals; however, many "meals" noted to only be small snacks/Magic cup. Last 3 meals noted to be bites only. True PO intake closer to 0-30% (average PO intake is ~ 10%).   Labs reviewed. Medications: Ensure Enlive TID, MVI, Miralax  Diet Order:   Diet Order            DIET DYS 3 Room service appropriate? Yes; Fluid consistency: Thin  Diet effective now                 EDUCATION NEEDS:   No education needs have been identified at this time  Skin:  Skin Assessment: Reviewed RN Assessment  Last BM:  7/28 type  4  Height:   Ht Readings from Last 1 Encounters:  04/13/20 5' (1.524 m)    Weight:   Wt Readings from Last 1 Encounters:  04/21/20 53.2 kg    BMI:  Body mass index is 22.91 kg/m.  Estimated Nutritional Needs:   Kcal:  1400-1600  Protein:  70-85 grams  Fluid:  >1.4L/d    Larkin Ina, MS, RD, LDN RD pager number and weekend/on-call pager number located in [place].

## 2020-04-22 NOTE — Progress Notes (Signed)
PROGRESS NOTE    [name]  DJM:[id number] DOB: Aug 09, 1956 DOA: 04/13/2020 PCP: Lujean Amel, MD   Brief Narrative: 64 year old with past medical history significant for advanced dementia, Parkinson disease, hypertension, hypercalcemia, chronic diastolic heart failure presented with worsening confusion, poor oral intake for 3 days prior to admission.  Patient stopped taking her medication 3 days prior to admission as well.  Patient denies any feeling of depression.  On arrival to the ED patient was severely dehydrated sodium 154, chloride 117, calcium 11.2.  CT head showed chronic cortical right parietal lobe infarct.  No new acute pathology.  CT cervical spine was also unremarkable.  Patient was admitted with a diagnosis of hypovolemic hyponatremia and hypercalcemia and dehydration and altered mental status encephalopathy secondary to dehydration.  Patient was a started on IV fluids, her electrolytes abnormality has resolved improved.  Patient became more alert and back to baseline however she remains intermittently confused due to her baseline dementia.  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 Patient with poor oral intake.  Patient is drinking ensure consistently/   2-Acute metabolic encephalopathy: Likely related to hypernatremia: TSH normal. Dr. Einar Grad did peer to peer review with Dr. Amalia Hailey and the recommendation is that patient qualifies for custodial care Social worker working with placement Ammonia 20. RPR negative, B 12 1125  3-Anorexia: Continue Ensure  4--Hypercalcemia likely to dehydration resume IV fluids Calcium  increase today. Resume IV fluids. Stop lasix.  Increase IV fluids.  PTH pending, vitamin D level 34.  5-Hypertension: Continue with Coreg, ibersartan.  Hold Lasix  6-Parkinson dementia; Continue with Sinemet 7-Oral thrush; nystatin  8-Moderate caloric malnutrition; in contest of chronic illness  Nutrition Problem: Moderate Malnutrition Etiology: chronic illness (dementia)    Signs/Symptoms: mild muscle depletion, mild fat depletion, moderate fat depletion, moderate muscle depletion, percent weight loss Percent weight loss: 11 %    Interventions: Magic cup, Ensure Enlive (each supplement provides 350kcal and 20 grams of protein)  Estimated body mass index is 22.91 kg/m as calculated from the following:   Height as of this encounter: 5' (1.524 m).   Weight as of this encounter: 53.2 kg.   DVT prophylaxis: SCD Code Status: full code Family Communication: Disposition Plan:  Status is: Inpatient  Remains inpatient appropriate because:Unsafe d/c plan   Dispo:  Patient From: Home  Planned Disposition: [place]  Expected discharge date: 04/17/20  Medically stable for discharge: Yes         Consultants:   none  Procedures:   none  Antimicrobials:    Subjective: Alert, answer few questions.  Staff has been feeding her. She is drinking ensure.   Objective: Vitals:   04/21/20 1632 04/21/20 2055 04/22/20 0514 04/22/20 0927  BP: 100/71 93/74 101/81 118/82  Pulse: 64 68 70 76  Resp: 18 18 15 16   Temp: 98.6 F (37 C) 98.4 F (36.9 C) 98.4 F (36.9 C) 98.1 F (36.7 C)  TempSrc:  Oral    SpO2: 99% 99% 96% 99%  Weight:  53.2 kg    Height:        Intake/Output Summary (Last 24 hours) at 04/22/2020 1441 Last data filed at  04/22/2020 1256 Gross per 24 hour  Intake 2136.1 ml  Output 250 ml  Net 1886.1 ml   Filed Weights   04/16/20 0500 04/17/20 0514 04/21/20 2055  Weight: 53.6 kg 53.2 kg 53.2 kg    Examination:  General exam: NAD  Respiratory system: CTA Cardiovascular system: S 1, S 2 RRR. Gastrointestinal system: BS present, soft, nt Central nervous system alert Extremities: no edema   Data Reviewed: I have personally reviewed following labs and imaging studies  CBC: Recent Labs  Lab 04/16/20 0444 04/17/20 0305 04/18/20 0508 04/19/20 0145 04/20/20 0540  WBC 5.1 4.3 4.2 5.3 4.8  NEUTROABS 2.6 1.7 1.7 2.8 2.3  HGB 12.5 12.2 12.4 12.4 12.6  HCT 40.0 39.5 39.5 39.9 41.5  MCV 85.7 83.9 84.2 84.4 85.2  PLT 114* 116* 115* 126* 638*   Basic Metabolic Panel: Recent Labs  Lab 04/16/20 0444 04/21/20 0643 04/22/20 0747  NA 140 139 141  K 3.9 4.1 4.7  CL 108 102 104  CO2 25 27 26   GLUCOSE 88 96 89  BUN 21 38* 35*  CREATININE 0.78 1.12* 0.97  CALCIUM 10.4* 11.2* 11.4*   GFR: Estimated Creatinine Clearance: 41 mL/min (by C-G formula based on SCr of 0.97 mg/dL). Liver Function Tests: No results for input(s): AST, ALT, ALKPHOS, BILITOT, PROT, ALBUMIN in the last 168 hours. No results for input(s): LIPASE, AMYLASE in the last 168 hours. Recent Labs  Lab 04/22/20 0119  AMMONIA 20   Coagulation Profile: No results for input(s): INR, PROTIME in the last 168 hours. Cardiac Enzymes: No results for input(s): CKTOTAL, CKMB, CKMBINDEX, TROPONINI in the last 168 hours. BNP (last 3 results) No results for input(s): PROBNP in the last 8760 hours. HbA1C: No results for input(s): HGBA1C in the last 72 hours. CBG: Recent Labs  Lab 04/21/20 2204 04/22/20 0650  GLUCAP 89 72   Lipid Profile: No results for input(s): CHOL, HDL, LDLCALC, TRIG, CHOLHDL, LDLDIRECT in the last 72 hours. Thyroid Function Tests: No results for input(s): TSH, T4TOTAL, FREET4, T3FREE, THYROIDAB in the last 72 hours. Anemia Panel: Recent Labs    04/22/20 0119  VITAMINB12 1,125*   Sepsis Labs: No results for input(s): PROCALCITON, LATICACIDVEN in the last 168 hours.  Recent Results (from the past 240 hour(s))  SARS  Coronavirus 2 by RT PCR (hospital order, performed in [facility] hospital lab) Nasopharyngeal Nasopharyngeal Swab     Status: None   Collection Time: 04/13/20  6:04 PM   Specimen: Nasopharyngeal Swab  Result Value Ref Range Status   SARS Coronavirus 2 NEGATIVE NEGATIVE Final    Comment: (NOTE) SARS-CoV-2 target nucleic acids are NOT DETECTED.  The SARS-CoV-2 RNA is generally detectable in upper and lower respiratory specimens during the acute phase of infection. The lowest concentration of SARS-CoV-2 viral copies this assay can detect is 250 copies / mL. A negative result does not preclude SARS-CoV-2 infection and should not be used as the sole basis for treatment or other patient management decisions.  A negative result may occur with improper specimen collection / handling, submission of specimen other than nasopharyngeal swab, presence of viral mutation(s) within the areas targeted by this assay, and inadequate number of viral copies (<250 copies / mL). A negative result must be combined with clinical observations, patient history, and epidemiological information.  Fact Sheet for Patients:   StrictlyIdeas.no  Fact Sheet for Healthcare Providers: BankingDealers.co.za  This test is not yet approved or  cleared by the [place] FDA and has been  authorized for detection and/or diagnosis of SARS-CoV-2 by FDA under an Emergency Use Authorization (EUA).  This EUA will remain in effect (meaning this test can be used) for the duration of the COVID-19 declaration under Section 564(b)(1) of the Act, 21 U.S.C. section 360bbb-3(b)(1), unless the authorization is terminated or revoked sooner.  Performed at [facility], [place] [address] [place], [place] [address]   MRSA PCR Screening     Status: None   Collection Time: 04/14/20  1:56 PM   Specimen: Nasopharyngeal  Result Value Ref Range Status   MRSA by PCR NEGATIVE NEGATIVE Final     Comment:        The GeneXpert MRSA Assay (FDA approved for NASAL specimens only), is one component of a comprehensive MRSA colonization surveillance program. It is not intended to diagnose MRSA infection nor to guide or monitor treatment for MRSA infections. Performed at [facility], [place] [address] [place], [place] [address]   SARS Coronavirus 2 by RT PCR (hospital order, performed in [facility] hospital lab) Nasopharyngeal Nasopharyngeal Swab     Status: None   Collection Time: 04/17/20  9:06 AM   Specimen: Nasopharyngeal Swab  Result Value Ref Range Status   SARS Coronavirus 2 NEGATIVE NEGATIVE Final    Comment: (NOTE) SARS-CoV-2 target nucleic acids are NOT DETECTED.  The SARS-CoV-2 RNA is generally detectable in upper and lower respiratory specimens during the acute phase of infection. The lowest concentration of SARS-CoV-2 viral copies this assay can detect is 250 copies / mL. A negative result does not preclude SARS-CoV-2 infection and should not be used as the sole basis for treatment or other patient management decisions.  A negative result may occur with improper specimen collection / handling, submission of specimen other than nasopharyngeal swab, presence of viral mutation(s) within the areas targeted by this assay, and inadequate number of viral copies (<250 copies / mL). A negative result must be combined with clinical observations, patient history, and epidemiological information.  Fact Sheet for Patients:   StrictlyIdeas.no  Fact Sheet for Healthcare Providers: BankingDealers.co.za  This test is not yet approved or  cleared by the [place] FDA and has been authorized for detection and/or diagnosis of SARS-CoV-2 by FDA under an Emergency Use Authorization (EUA).  This EUA will remain in effect (meaning this test can be used) for the duration of the COVID-19 declaration under Section 564(b)(1) of the  Act, 21 U.S.C. section 360bbb-3(b)(1), unless the authorization is terminated or revoked sooner.  Performed at [facility], [place] [address] [place], [place] [address]          Radiology Studies: No results found.      Scheduled Meds: . aspirin  325 mg Oral Daily  . carbidopa-levodopa  1 tablet Oral TID  . carvedilol  12.5 mg Oral BID WC  . chlorhexidine  15 mL Mouth Rinse BID  . divalproex  125 mg Oral Daily  . donepezil  10 mg Oral Daily  . feeding supplement (ENSURE ENLIVE)  237 mL Oral QID  . irbesartan  75 mg Oral Daily  . labetalol  20 mg Intravenous Once  . LORazepam  0.5 mg Oral QPM  . mouth rinse  15 mL Mouth Rinse q12n4p  . multivitamin with minerals  1 tablet Oral Daily  . nystatin  5 mL Oral QID  . polyethylene glycol  17 g Oral Daily  . rosuvastatin  10 mg Oral Daily  . sertraline  100 mg  Oral QHS  . sodium chloride flush  3 mL Intravenous Once   Continuous Infusions: . lactated ringers 125 mL/hr at 04/22/20 1312     LOS: 9 days    Time spent: 35 minutes.     Elmarie Shiley, MD [facility]   If 7PM-7AM, please contact night-coverage [email or web address]  04/22/2020, 2:41 PM

## 2020-04-22 NOTE — TOC Progression Note (Signed)
Transition of Care [facility]) - Progression Note    Patient Details  Name: [name] MRN: [id number] Date of Birth: Jan 01, 1956  Transition of [facility]) CM/SW Contact  Sharlet Salina Mila Homer, LCSW Phone Number: 04/22/2020, 2:31 PM  Clinical Narrative:  Attempted to reach DSS case worker [name] [phone]) and got her VM. No message left.   2:10 pm: Received call from son [name] and update provided. Explained that I will call again tomorrow and if I get case worker's VM, I will call her supervisor, due to having to wait 48 hours before calling the supervisor. Son asked about doing POA paperwork and this was discussed. Advised him that his mother would be the person that would have to do [facility] POA paperwork or Durable POA paperwork. Son asked for copy of [place]-2 and was informed that this could not be given to him and why. Son expressed understanding.   CSW expressed appreciation to son following up and doing what he needs to do for his mother.      Expected Discharge Plan: Skilled Nursing Facility Barriers to Discharge: Ship broker, Other (comment) (Initiating facility search)  Expected Discharge Plan and Services Expected Discharge Plan: [place] In-house Referral: Clinical Social Work     Living arrangements for the past 2 months: [place] ([facility])                                       Social Determinants of Health (SDOH) Interventions    Readmission Risk Interventions Readmission Risk Prevention Plan 01/02/2020 12/25/2019  Transportation Screening Complete Complete  PCP or Specialist Appt within 5-7 Days Complete Complete  Home Care Screening Complete Complete  Medication Review (RN CM) Complete Complete  Some recent data might be hidden

## 2020-04-23 LAB — PTH, INTACT AND CALCIUM
Calcium, Total (PTH): 11.1 mg/dL — ABNORMAL HIGH (ref 8.7–10.3)
PTH: 29 pg/mL (ref 15–65)

## 2020-04-23 LAB — BASIC METABOLIC PANEL
Anion gap: 9 (ref 5–15)
BUN: 26 mg/dL — ABNORMAL HIGH (ref 8–23)
CO2: 25 mmol/L (ref 22–32)
Calcium: 10.6 mg/dL — ABNORMAL HIGH (ref 8.9–10.3)
Chloride: 103 mmol/L (ref 98–111)
Creatinine, Ser: 0.79 mg/dL (ref 0.44–1.00)
GFR calc Af Amer: >60 mL/min (ref >60)
GFR calc non Af Amer: >60 mL/min (ref >60)
Glucose, Bld: 88 mg/dL (ref 70–99)
Potassium: 5 mmol/L (ref 3.5–5.1)
Sodium: 137 mmol/L (ref 135–145)

## 2020-04-23 NOTE — Progress Notes (Signed)
Physical Therapy Treatment Patient Details Name: [name] MRN: [id number] DOB: September 21, 1956 Today's Date: 04/23/2020    History of Present Illness [name] is a 64 y.o. female with medical history significant of advanced dementia, Parkinson disease, hypertension, hypercalcemia, chronic diastolic CHF, presented with worsening confusion and not eating or drinking x3 days.    PT Comments    Focus of session was on sitting balance and transfers. Attempted to have pt participate in general LE therex sitting upright at EOB, but despite max cueing and demonstration pt not performing secondary to cognitive deficits. She was able to stand from EOB x1 with max A x2 and required continued support for balance in standing secondary to a significant posterior lean. Pt would continue to benefit from skilled physical therapy services at this time while admitted and after d/c to address the below listed limitations in order to improve overall safety and independence with functional mobility.    Follow Up Recommendations  SNF     Equipment Recommendations  Hospital bed    Recommendations for Other Services       Precautions / Restrictions Precautions Precautions: Fall Restrictions Weight Bearing Restrictions: No    Mobility  Bed Mobility Overal bed mobility: Needs Assistance Bed Mobility: Supine to Sit;Sit to Supine     Supine to sit: Max assist;+2 for physical assistance;HOB elevated Sit to supine: Total assist;+2 for physical assistance   General bed mobility comments: pt able to assist with bilateral LE movement towards EOB, use of bed pads and heavy physical assistance needed to complete transition into sitting upright at EOB. Total A to return to supine  Transfers Overall transfer level: Needs assistance Equipment used: 2 person hand held assist Transfers: Sit to/from Stand Sit to Stand: Max assist;+2 physical assistance         General transfer comment: face-to-face  method with gait belt and use of bed pads to complete sit-to-stand from EOB with use of momentum. Pt with heavy posterior lean and required max A x2 to maintain upright standing balance  Ambulation/Gait             General Gait Details: unable   Stairs             Wheelchair Mobility    Modified Rankin (Stroke Patients Only)       Balance Overall balance assessment: Needs assistance Sitting-balance support: Feet supported;Bilateral upper extremity supported Sitting balance-Leahy Scale: Poor Sitting balance - Comments: initially requiring mod A to maintain upright sitting, progressing to close min guard   Standing balance support: Bilateral upper extremity supported Standing balance-Leahy Scale: Zero                              Cognition Arousal/Alertness: Awake/alert Behavior During Therapy: Flat affect Overall Cognitive Status: Impaired/Different from baseline Area of Impairment: Attention;Memory;Following commands;Safety/judgement;Awareness;Problem solving                   Current Attention Level: Sustained Memory: Decreased short-term memory Following Commands: Follows one step commands inconsistently Safety/Judgement: Decreased awareness of safety;Decreased awareness of deficits Awareness: Intellectual Problem Solving: Slow processing;Decreased initiation;Difficulty sequencing;Requires verbal cues;Requires tactile cues        Exercises      General Comments        Pertinent Vitals/Pain Pain Assessment: Faces Faces Pain Scale: No hurt    Home Living  Prior Function            PT Goals (current goals can now be found in the care plan section) Acute Rehab PT Goals PT Goal Formulation: Patient unable to participate in goal setting Time For Goal Achievement: 04/28/20 Potential to Achieve Goals: Fair Progress towards PT goals: Progressing toward goals    Frequency    Min 2X/week       PT Plan Current plan remains appropriate    Co-evaluation              AM-PAC PT "6 Clicks" Mobility   Outcome Measure  Help needed turning from your back to your side while in a flat bed without using bedrails?: A Lot Help needed moving from lying on your back to sitting on the side of a flat bed without using bedrails?: A Lot Help needed moving to and from a bed to a chair (including a wheelchair)?: Total Help needed standing up from a chair using your arms (e.g., wheelchair or bedside chair)?: A Lot Help needed to walk in hospital room?: Total Help needed climbing 3-5 steps with a railing? : Total 6 Click Score: 9    End of Session Equipment Utilized During Treatment: Gait belt Activity Tolerance: Patient limited by fatigue Patient left: in bed;with bed alarm set;with call bell/phone within reach Nurse Communication: Mobility status PT Visit Diagnosis: Unsteadiness on feet (R26.81);Other abnormalities of gait and mobility (R26.89);Muscle weakness (generalized) (M62.81);History of falling (Z91.81)     Time: 5183-3582 PT Time Calculation (min) (ACUTE ONLY): 16 min  Charges:  $Therapeutic Activity: 8-22 mins                     Anastasio Champion, DPT  Acute Rehabilitation Services Pager [phone] Office [place] 04/23/2020, 2:22 PM

## 2020-04-23 NOTE — Plan of Care (Signed)
  Problem: Education: Goal: Knowledge of General Education information will improve Description Including pain rating scale, medication(s)/side effects and non-pharmacologic comfort measures Outcome: Progressing   

## 2020-04-23 NOTE — TOC Progression Note (Signed)
Transition of Care [facility]) - Progression Note    Patient Details  Name: TOMIKA ECKLES MRN: [id number] Date of Birth: 1956-06-27  Transition of Care [facility]) CM/SW Contact  Sharlet Salina Mila Homer, LCSW Phone Number: 04/23/2020, 10:01 AM  Clinical Narrative:  Attempted again to reach Adult Medicaid eligibility worker Cherene Altes [phone]) and got her VM. Did not leave a message and called her supervisor (as had not received a call back within 48 hours) Sharmon Leyden [phone]). Left Ms. Evelene Croon a message regarding patient and Medicaid. CSW will call again on Monday if no call received today.      Expected Discharge Plan: Skilled Nursing Facility Barriers to Discharge: Ship broker, Other (comment) (Initiating facility search)  Expected Discharge Plan and Services Expected Discharge Plan: [place] In-house Referral: Clinical Social Work     Living arrangements for the past 2 months: [place] [employer])                                     Social Determinants of Health (SDOH) Interventions  Patient needs somewhere to live and trying to determine if patient has Medicaid.  Readmission Risk Interventions Readmission Risk Prevention Plan 01/02/2020 12/25/2019  Transportation Screening Complete Complete  PCP or Specialist Appt within 5-7 Days Complete Complete  Home Care Screening Complete Complete  Medication Review (RN CM) Complete Complete  Some recent data might be hidden

## 2020-04-23 NOTE — Progress Notes (Signed)
PROGRESS NOTE    [name]  FIE:[id number] DOB: October 15, 1955 DOA: 04/13/2020 PCP: Lujean Amel, MD   Brief Narrative: 64 year old with past medical history significant for advanced dementia, Parkinson disease, hypertension, hypercalcemia, chronic diastolic heart failure presented with worsening confusion, poor oral intake for 3 days prior to admission.  Patient stopped taking her medication 3 days prior to admission as well.  Patient denies any feeling of depression.  On arrival to the ED patient was severely dehydrated sodium 154, chloride 117, calcium 11.2.  CT head showed chronic cortical right parietal lobe infarct.  No new acute pathology.  CT cervical spine was also unremarkable.  Patient was admitted with a diagnosis of hypovolemic hyponatremia and hypercalcemia and dehydration and altered mental status encephalopathy secondary to dehydration.  Patient was a started on IV fluids, her electrolytes abnormality has resolved improved.  Patient became more alert and back to baseline however she remains intermittently confused due to her baseline dementia.  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 Patient with poor oral intake.  Patient is drinking ensure consistently/   2-Acute metabolic encephalopathy: Likely related to hypernatremia: TSH normal. Dr. Einar Grad did peer to peer review with Dr. Amalia Hailey and the recommendation is that patient qualifies for custodial care Social worker working with placement Ammonia 20. RPR negative, B 12 1125  3-Anorexia: Continue Ensure  4--Hypercalcemia likely to dehydration resume IV fluids  Resume IV  fluids. Stop lasix.  PTH normal, vitamin D level 34. Check SPEP, UPEP Continue with fluids. Calcium decreased today.   5-Hypertension: Continue with Coreg, ibersartan.  Hold Lasix  6-Parkinson dementia; Continue with Sinemet 7-Oral thrush; nystatin  8-Moderate caloric malnutrition; in contest of chronic illness  Nutrition Problem: Moderate Malnutrition Etiology: chronic illness (dementia)    Signs/Symptoms: mild muscle depletion, mild fat depletion, moderate fat depletion, moderate muscle depletion, percent weight loss Percent weight loss: 11 %    Interventions: Magic cup, Ensure Enlive (each supplement provides 350kcal and 20 grams of protein)  Estimated body mass index is 22.91 kg/m as calculated from the following:   Height as of this encounter: 5' (1.524 m).   Weight as of this encounter: 53.2 kg.   DVT prophylaxis: SCD Code Status: full code Family Communication: Disposition Plan:  Status is: Inpatient  Remains inpatient appropriate because:Unsafe d/c plan   Dispo:  Patient From: Home  Planned Disposition: [place]  Expected discharge date: 04/17/20  Medically stable for discharge: Yes         Consultants:   none  Procedures:   none  Antimicrobials:    Subjective: Alert, report feeling well. She likes to drink ensure.    Objective: Vitals:   04/22/20 0927 04/22/20 2122 04/23/20 0534 04/23/20 0854  BP: 118/82 (!) 146/94 (!) 155/72 (!) 159/84  Pulse: 76 68 59 58  Resp: 16 17 15 18   Temp: 98.1 F (36.7 C) 98.3 F (36.8 C) 98.2 F (36.8 C) 97.8 F (36.6 C)  TempSrc:   Oral Oral  SpO2: 99% 100% 100% 100%  Weight:      Height:        Intake/Output Summary (Last 24 hours) at 04/23/2020 1341 Last data  filed at 04/23/2020 0800 Gross per 24 hour  Intake 477 ml  Output 900 ml  Net -423 ml   Filed Weights   04/16/20 0500 04/17/20 0514 04/21/20 2055  Weight: 53.6 kg 53.2 kg 53.2 kg    Examination:  General exam:  NAD Respiratory system: CTA Cardiovascular system: S 1, S 2 RRR Gastrointestinal system: BS present, soft, nt Central nervous system ; alert.  Extremities: No edema   Data Reviewed: I have personally reviewed following labs and imaging studies  CBC: Recent Labs  Lab 04/17/20 0305 04/18/20 0508 04/19/20 0145 04/20/20 0540  WBC 4.3 4.2 5.3 4.8  NEUTROABS 1.7 1.7 2.8 2.3  HGB 12.2 12.4 12.4 12.6  HCT 39.5 39.5 39.9 41.5  MCV 83.9 84.2 84.4 85.2  PLT 116* 115* 126* 128*   Basic Metabolic Panel: Recent Labs  Lab 04/21/20 0643 04/22/20 0119 04/22/20 0747 04/23/20 0653  NA 139  --  141 137  K 4.1  --  4.7 5.0  CL 102  --  104 103  CO2 27  --  26 25  GLUCOSE 96  --  89 88  BUN 38*  --  35* 26*  CREATININE 1.12*  --  0.97 0.79  CALCIUM 11.2* 11.1* 11.4* 10.6*   GFR: Estimated Creatinine Clearance: 49.7 mL/min (by C-G formula based on SCr of 0.79 mg/dL). Liver Function Tests: No results for input(s): AST, ALT, ALKPHOS, BILITOT, PROT, ALBUMIN in the last 168 hours. No results for input(s): LIPASE, AMYLASE in the last 168 hours. Recent Labs  Lab 04/22/20 0119  AMMONIA 20   Coagulation Profile: No results for input(s): INR, PROTIME in the last 168 hours. Cardiac Enzymes: No results for input(s): CKTOTAL, CKMB, CKMBINDEX, TROPONINI in the last 168 hours. BNP (last 3 results) No results for input(s): PROBNP in the last 8760 hours. HbA1C: No results for input(s): HGBA1C in the last 72 hours. CBG: Recent Labs  Lab 04/21/20 2204 04/22/20 0650  GLUCAP 89 72   Lipid Profile: No results for input(s): CHOL, HDL, LDLCALC, TRIG, CHOLHDL, LDLDIRECT in the last 72 hours. Thyroid Function Tests: No results for input(s): TSH, T4TOTAL, FREET4, T3FREE, THYROIDAB in the last 72 hours. Anemia Panel: Recent Labs    04/22/20 0119  VITAMINB12 1,125*   Sepsis Labs: No results for input(s): PROCALCITON, LATICACIDVEN in the last 168 hours.  Recent Results (from the past 240  hour(s))  SARS Coronavirus 2 by RT PCR (hospital order, performed in [facility] hospital lab) Nasopharyngeal Nasopharyngeal Swab     Status: None   Collection Time: 04/13/20  6:04 PM   Specimen: Nasopharyngeal Swab  Result Value Ref Range Status   SARS Coronavirus 2 NEGATIVE NEGATIVE Final    Comment: (NOTE) SARS-CoV-2 target nucleic acids are NOT DETECTED.  The SARS-CoV-2 RNA is generally detectable in upper and lower respiratory specimens during the acute phase of infection. The lowest concentration of SARS-CoV-2 viral copies this assay can detect is 250 copies / mL. A negative result does not preclude SARS-CoV-2 infection and should not be used as the sole basis for treatment or other patient management decisions.  A negative result may occur with improper specimen collection / handling, submission of specimen other than nasopharyngeal swab, presence of viral mutation(s) within the areas targeted by this assay, and inadequate number of viral copies (<250 copies / mL). A negative result must be combined with clinical observations, patient history, and epidemiological information.  Fact Sheet for Patients:   StrictlyIdeas.no  Fact Sheet for Healthcare  Providers: BankingDealers.co.za  This test is not yet approved or  cleared by the [place] and has been authorized for detection and/or diagnosis of SARS-CoV-2 by FDA under an Emergency Use Authorization (EUA).  This EUA will remain in effect (meaning this test can be used) for the duration of the COVID-19 declaration under Section 564(b)(1) of the Act, 21 U.S.C. section 360bbb-3(b)(1), unless the authorization is terminated or revoked sooner.  Performed at [facility], [place] [address], [place], [place] [address]   MRSA PCR Screening     Status: None   Collection Time: 04/14/20  1:56 PM   Specimen: Nasopharyngeal  Result Value Ref Range Status   MRSA by PCR NEGATIVE  NEGATIVE Final    Comment:        The GeneXpert MRSA Assay (FDA approved for NASAL specimens only), is one component of a comprehensive MRSA colonization surveillance program. It is not intended to diagnose MRSA infection nor to guide or monitor treatment for MRSA infections. Performed at [facility], [place] [address] [place], [place] [address]   SARS Coronavirus 2 by RT PCR (hospital order, performed in [facility] hospital lab) Nasopharyngeal Nasopharyngeal Swab     Status: None   Collection Time: 04/17/20  9:06 AM   Specimen: Nasopharyngeal Swab  Result Value Ref Range Status   SARS Coronavirus 2 NEGATIVE NEGATIVE Final    Comment: (NOTE) SARS-CoV-2 target nucleic acids are NOT DETECTED.  The SARS-CoV-2 RNA is generally detectable in upper and lower respiratory specimens during the acute phase of infection. The lowest concentration of SARS-CoV-2 viral copies this assay can detect is 250 copies / mL. A negative result does not preclude SARS-CoV-2 infection and should not be used as the sole basis for treatment or other patient management decisions.  A negative result may occur with improper specimen collection / handling, submission of specimen other than nasopharyngeal swab, presence of viral mutation(s) within the areas targeted by this assay, and inadequate number of viral copies (<250 copies / mL). A negative result must be combined with clinical observations, patient history, and epidemiological information.  Fact Sheet for Patients:   StrictlyIdeas.no  Fact Sheet for Healthcare Providers: BankingDealers.co.za  This test is not yet approved or  cleared by the [place] FDA and has been authorized for detection and/or diagnosis of SARS-CoV-2 by FDA under an Emergency Use Authorization (EUA).  This EUA will remain in effect (meaning this test can be used) for the duration of the COVID-19 declaration under Section  564(b)(1) of the Act, 21 U.S.C. section 360bbb-3(b)(1), unless the authorization is terminated or revoked sooner.  Performed at [facility], [place] [address] [place], [place] [address]          Radiology Studies: No results found.      Scheduled Meds: . aspirin  325 mg Oral Daily  . carbidopa-levodopa  1 tablet Oral TID  . carvedilol  12.5 mg Oral BID WC  . chlorhexidine  15 mL Mouth Rinse BID  . divalproex  125 mg Oral Daily  . donepezil  10 mg Oral Daily  . feeding supplement (ENSURE ENLIVE)  237 mL Oral QID  . irbesartan  75 mg Oral Daily  . labetalol  20 mg Intravenous Once  . LORazepam  0.5 mg Oral QPM  . mouth rinse  15 mL Mouth Rinse q12n4p  . multivitamin with minerals  1 tablet Oral Daily  . nystatin  5 mL Oral QID  . polyethylene  glycol  17 g Oral Daily  . rosuvastatin  10 mg Oral Daily  . sertraline  100 mg Oral QHS  . sodium chloride flush  3 mL Intravenous Once   Continuous Infusions: . lactated ringers 100 mL/hr at 04/23/20 0759     LOS: 10 days    Time spent: 35 minutes.     Elmarie Shiley, MD [facility]   If 7PM-7AM, please contact night-coverage [email or web address]  04/23/2020, 1:41 PM

## 2020-04-24 LAB — BASIC METABOLIC PANEL
Anion gap: 7 (ref 5–15)
BUN: 20 mg/dL (ref 8–23)
CO2: 23 mmol/L (ref 22–32)
Calcium: 10.2 mg/dL (ref 8.9–10.3)
Chloride: 105 mmol/L (ref 98–111)
Creatinine, Ser: 0.76 mg/dL (ref 0.44–1.00)
GFR calc Af Amer: >60 mL/min (ref >60)
GFR calc non Af Amer: >60 mL/min (ref >60)
Glucose, Bld: 88 mg/dL (ref 70–99)
Potassium: 4 mmol/L (ref 3.5–5.1)
Sodium: 135 mmol/L (ref 135–145)

## 2020-04-24 MED ORDER — CIPROFLOXACIN HCL 0.3 % OP SOLN
1.0000 [drp] | OPHTHALMIC | Status: DC
  Administered 2020-04-24 – 2020-05-11 (×80): 1 [drp] via OPHTHALMIC
  Filled 2020-04-24 (×2): qty 2.5

## 2020-04-24 MED ORDER — HYPROMELLOSE (GONIOSCOPIC) 2.5 % OP SOLN
1.0000 [drp] | Freq: Three times a day (TID) | OPHTHALMIC | Status: DC
  Administered 2020-04-24 – 2020-05-27 (×99): 1 [drp] via OPHTHALMIC
  Filled 2020-04-24 (×2): qty 15

## 2020-04-24 NOTE — Plan of Care (Signed)
  Problem: Education: Goal: Knowledge of General Education information will improve Description: Including pain rating scale, medication(s)/side effects and non-pharmacologic comfort measures Outcome: Progressing   Problem: Nutrition: Goal: Adequate nutrition will be maintained Outcome: Progressing   

## 2020-04-24 NOTE — Progress Notes (Signed)
PROGRESS NOTE    [name]  GNO:[id number] DOB: 07-Oct-1955 DOA: 04/13/2020 PCP: Lujean Amel, MD   Brief Narrative: 64 year old with past medical history significant for advanced dementia, Parkinson disease, hypertension, hypercalcemia, chronic diastolic heart failure presented with worsening confusion, poor oral intake for 3 days prior to admission.  Patient stopped taking her medication 3 days prior to admission as well.  Patient denies any feeling of depression.  On arrival to the ED patient was severely dehydrated sodium 154, chloride 117, calcium 11.2.  CT head showed chronic cortical right parietal lobe infarct.  No new acute pathology.  CT cervical spine was also unremarkable.  Patient was admitted with a diagnosis of hypovolemic hyponatremia and hypercalcemia and dehydration and altered mental status encephalopathy secondary to dehydration.  Patient was a started on IV fluids, her electrolytes abnormality has resolved improved.  Patient became more alert and back to baseline however she remains intermittently confused due to her baseline dementia.  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 Patient with poor oral intake.  Patient is drinking ensure consistently/   2-Acute metabolic encephalopathy: Likely related to hypernatremia: TSH normal. Dr. Einar Grad did peer to peer review with Dr. Amalia Hailey and the recommendation is that patient qualifies for custodial care Social worker working with placement Ammonia 20. RPR negative, B 12 1125  3-Anorexia: Continue Ensure  4--Hypercalcemia likely to dehydration resume IV fluids  Resume IV  fluids. Stop lasix.  PTH normal, vitamin D level 34. Check SPEP, UPEP Continue with fluids. Calcium decreased today. normal range  5-Hypertension: Continue with Coreg, ibersartan.  Hold Lasix  6-Parkinson dementia; Continue with Sinemet 7-Oral thrush; nystatin  8-Moderate caloric malnutrition; in contest of chronic illness 9- Conjunctivitis; start cipro opthalmic  Nutrition Problem: Moderate Malnutrition Etiology: chronic illness (dementia)    Signs/Symptoms: mild muscle depletion, mild fat depletion, moderate fat depletion, moderate muscle depletion, percent weight loss Percent weight loss: 11 %    Interventions: Magic cup, Ensure Enlive (each supplement provides 350kcal and 20 grams of protein)  Estimated body mass index is 22.91 kg/m as calculated from the following:   Height as of this encounter: 5' (1.524 m).   Weight as of this encounter: 53.2 kg.   DVT prophylaxis: SCD Code Status: full code Family Communication: Disposition Plan:  Status is: Inpatient  Remains inpatient appropriate because:Unsafe d/c plan   Dispo:  Patient From: Home  Planned Disposition: [place]  Expected discharge date: 04/17/20  Medically stable for discharge: Yes         Consultants:   none  Procedures:   none  Antimicrobials:    Subjective: Alert, report right eye itching. Mild redness.  She report that she has been eating  Objective: Vitals:   04/23/20 1809 04/23/20 2116 04/24/20 0530 04/24/20 1013  BP: 108/68 (!) 123/86 (!) 149/93 127/67  Pulse: 58 59 57 64  Resp: 18 15 16 16   Temp: 97.7 F (36.5 C) 98.5 F (36.9 C) 98.4 F (36.9 C) 98 F (36.7 C)  TempSrc: Oral Oral  Oral  SpO2: 100% 99% 100% 99%  Weight:  53.2 kg    Height:  Intake/Output Summary (Last 24 hours) at 04/24/2020 1332 Last data filed at 04/24/2020 1300 Gross per 24 hour  Intake 4920.11 ml  Output 740 ml  Net 4180.11 ml   Filed Weights   04/17/20 0514  04/21/20 2055 04/23/20 2116  Weight: 53.2 kg 53.2 kg 53.2 kg    Examination:  General exam: NAD Respiratory system; clear to auscultation Cardiovascular system: S1-S2 regular rhythm or rate  Gastrointestinal system: Bowel  sounds present, soft nontender not distended Central nervous system; alert Extremities: No edema   Data Reviewed: I have personally reviewed following labs and imaging studies  CBC: Recent Labs  Lab 04/18/20 0508 04/19/20 0145 04/20/20 0540  WBC 4.2 5.3 4.8  NEUTROABS 1.7 2.8 2.3  HGB 12.4 12.4 12.6  HCT 39.5 39.9 41.5  MCV 84.2 84.4 85.2  PLT 115* 126* 101*   Basic Metabolic Panel: Recent Labs  Lab 04/21/20 0643 04/22/20 0119 04/22/20 0747 04/23/20 0653 04/24/20 0425  NA 139  --  141 137 135  K 4.1  --  4.7 5.0 4.0  CL 102  --  104 103 105  CO2 27  --  26 25 23   GLUCOSE 96  --  89 88 88  BUN 38*  --  35* 26* 20  CREATININE 1.12*  --  0.97 0.79 0.76  CALCIUM 11.2* 11.1* 11.4* 10.6* 10.2   GFR: Estimated Creatinine Clearance: 49.7 mL/min (by C-G formula based on SCr of 0.76 mg/dL). Liver Function Tests: No results for input(s): AST, ALT, ALKPHOS, BILITOT, PROT, ALBUMIN in the last 168 hours. No results for input(s): LIPASE, AMYLASE in the last 168 hours. Recent Labs  Lab 04/22/20 0119  AMMONIA 20   Coagulation Profile: No results for input(s): INR, PROTIME in the last 168 hours. Cardiac Enzymes: No results for input(s): CKTOTAL, CKMB, CKMBINDEX, TROPONINI in the last 168 hours. BNP (last 3 results) No results for input(s): PROBNP in the last 8760 hours. HbA1C: No results for input(s): HGBA1C in the last 72 hours. CBG: Recent Labs  Lab 04/21/20 2204 04/22/20 0650  GLUCAP 89 72   Lipid Profile: No results for input(s): CHOL, HDL, LDLCALC, TRIG, CHOLHDL, LDLDIRECT in the last 72 hours. Thyroid Function Tests: No results for input(s): TSH, T4TOTAL, FREET4, T3FREE, THYROIDAB in the last 72 hours. Anemia Panel: Recent Labs     04/22/20 0119  VITAMINB12 1,125*   Sepsis Labs: No results for input(s): PROCALCITON, LATICACIDVEN in the last 168 hours.  Recent Results (from the past 240 hour(s))  MRSA PCR Screening     Status: None   Collection Time: 04/14/20  1:56 PM   Specimen: Nasopharyngeal  Result Value Ref Range Status   MRSA by PCR NEGATIVE NEGATIVE Final    Comment:        The GeneXpert MRSA Assay (FDA approved for NASAL specimens only), is one component of a comprehensive MRSA colonization surveillance program. It is not intended to diagnose MRSA infection nor to guide or monitor treatment for MRSA infections. Performed at [facility], [place] [address] [place], [place] [address]   SARS Coronavirus 2 by RT PCR (hospital order, performed in [facility] hospital lab) Nasopharyngeal Nasopharyngeal Swab     Status: None   Collection Time: 04/17/20  9:06 AM   Specimen: Nasopharyngeal Swab  Result Value Ref Range Status   SARS Coronavirus 2 NEGATIVE NEGATIVE Final    Comment: (NOTE) SARS-CoV-2 target nucleic acids are NOT DETECTED.  The SARS-CoV-2 RNA is generally detectable in upper and lower  respiratory specimens during the acute phase of infection. The lowest concentration of SARS-CoV-2 viral copies this assay can detect is 250 copies / mL. A negative result does not preclude SARS-CoV-2 infection and should not be used as the sole basis for treatment or other patient management decisions.  A negative result may occur with improper specimen collection / handling, submission of specimen other than nasopharyngeal swab, presence of viral mutation(s) within the areas targeted by this assay, and inadequate number of viral copies (<250 copies / mL). A negative result must be combined with clinical observations, patient history, and epidemiological information.  Fact Sheet for Patients:   StrictlyIdeas.no  Fact Sheet for Healthcare  Providers: BankingDealers.co.za  This test is not yet approved or  cleared by the [place] FDA and has been authorized for detection and/or diagnosis of SARS-CoV-2 by FDA under an Emergency Use Authorization (EUA).  This EUA will remain in effect (meaning this test can be used) for the duration of the COVID-19 declaration under Section 564(b)(1) of the Act, 21 U.S.C. section 360bbb-3(b)(1), unless the authorization is terminated or revoked sooner.  Performed at [facility], [place] [address], [place], [place] [address]          Radiology Studies: No results found.      Scheduled Meds: . aspirin  325 mg Oral Daily  . carbidopa-levodopa  1 tablet Oral TID  . carvedilol  12.5 mg Oral BID WC  . chlorhexidine  15 mL Mouth Rinse BID  . ciprofloxacin  1 drop Right Eye Q4H while awake  . divalproex  125 mg Oral Daily  . donepezil  10 mg Oral Daily  . feeding supplement (ENSURE ENLIVE)  237 mL Oral QID  . hydroxypropyl methylcellulose / hypromellose  1 drop Both Eyes TID  . irbesartan  75 mg Oral Daily  . labetalol  20 mg Intravenous Once  . LORazepam  0.5 mg Oral QPM  . mouth rinse  15 mL Mouth Rinse q12n4p  . multivitamin with minerals  1 tablet Oral Daily  . nystatin  5 mL Oral QID  . polyethylene glycol  17 g Oral Daily  . rosuvastatin  10 mg Oral Daily  . sertraline  100 mg Oral QHS  . sodium chloride flush  3 mL Intravenous Once   Continuous Infusions: . lactated ringers 100 mL/hr at 04/24/20 1309     LOS: 11 days    Time spent: 35 minutes.     Elmarie Shiley, MD [facility]   If 7PM-7AM, please contact night-coverage [email or web address]  04/24/2020, 1:32 PM

## 2020-04-25 MED ORDER — MEGESTROL ACETATE 400 MG/10ML PO SUSP
400.0000 mg | Freq: Every day | ORAL | Status: DC
  Administered 2020-04-25 – 2020-05-28 (×33): 400 mg via ORAL
  Filled 2020-04-25 (×34): qty 10

## 2020-04-25 MED ORDER — ENOXAPARIN SODIUM 40 MG/0.4ML ~~LOC~~ SOLN
40.0000 mg | SUBCUTANEOUS | Status: DC
  Administered 2020-04-25 – 2020-05-28 (×33): 40 mg via SUBCUTANEOUS
  Filled 2020-04-25 (×34): qty 0.4

## 2020-04-25 NOTE — Progress Notes (Signed)
PROGRESS NOTE    [name]  QQI:[id number] DOB: Feb 07, 1956 DOA: 04/13/2020 PCP: Lujean Amel, MD   Brief Narrative: 64 year old with past medical history significant for advanced dementia, Parkinson disease, hypertension, hypercalcemia, chronic diastolic heart failure presented with worsening confusion, poor oral intake for 3 days prior to admission.  Patient stopped taking her medication 3 days prior to admission as well.  Patient denies any feeling of depression.  On arrival to the ED patient was severely dehydrated sodium 154, chloride 117, calcium 11.2.  CT head showed chronic cortical right parietal lobe infarct.  No new acute pathology.  CT cervical spine was also unremarkable.  Patient was admitted with a diagnosis of hypovolemic hyponatremia and hypercalcemia and dehydration and altered mental status encephalopathy secondary to dehydration.  Patient was a started on IV fluids, her electrolytes abnormality has resolved improved.  Patient became more alert and back to baseline however she remains intermittently confused due to her baseline dementia.  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 Patient with poor oral intake.  Patient is drinking ensure consistently/   2-Acute metabolic encephalopathy: Likely related to hypernatremia: TSH normal. Dr. Einar Grad did peer to peer review with Dr. Amalia Hailey and the recommendation is that patient qualifies for custodial care Social worker working with placement Ammonia 20. RPR negative, B 12 1125  3-Anorexia: Continue Ensure  4--Hypercalcemia likely to dehydration resume IV fluids  Resume IV  fluids. Stop lasix.  PTH normal, vitamin D level 34. Check SPEP, UPEP Pending results.  Continue with fluids. Calcium decreased today. normal range  5-Hypertension: Continue with Coreg, ibersartan.  Hold Lasix  6-Parkinson dementia; Continue with Sinemet.  7-Oral thrush; nystatin, improved.  8-Moderate caloric malnutrition; in contest of chronic illness 9- Conjunctivitis; start cipro opthalmic  Nutrition Problem: Moderate Malnutrition Etiology: chronic illness (dementia)    Signs/Symptoms: mild muscle depletion, mild fat depletion, moderate fat depletion, moderate muscle depletion, percent weight loss Percent weight loss: 11 %    Interventions: Magic cup, Ensure Enlive (each supplement provides 350kcal and 20 grams of protein)  Estimated body mass index is 22.91 kg/m as calculated from the following:   Height as of this encounter: 5' (1.524 m).   Weight as of this encounter: 53.2 kg.   DVT prophylaxis: SCD, start Lovenox Code Status: full code Family Communication: Disposition Plan:  Status is: Inpatient  Remains inpatient appropriate because:Unsafe d/c plan   Dispo:  Patient From: Home  Planned Disposition: [place]  Expected discharge date: 04/17/20  Medically stable for discharge: Yes         Consultants:   none  Procedures:   none  Antimicrobials:    Subjective: She is alert, feels well, poor oral  inatke She agrees to start taking megace.  Objective: Vitals:   04/24/20 0530 04/24/20 1013 04/24/20 2141 04/25/20 0949  BP: (!) 149/93 127/67 (!) 172/108 (!) 170/99  Pulse: 57 64 53 61  Resp: 16 16 17 18   Temp: 98.4 F (36.9 C) 98 F (36.7 C) 97.8 F (36.6 C) 98.1 F (36.7 C)  TempSrc:  Oral  Oral  SpO2: 100% 99% 95% 98%  Weight:      Height:  Intake/Output Summary (Last 24 hours) at 04/25/2020 1326 Last data filed at 04/25/2020 1100 Gross per 24 hour  Intake 2207.8 ml  Output 1200 ml  Net 1007.8 ml   Filed  Weights   04/17/20 0514 04/21/20 2055 04/23/20 2116  Weight: 53.2 kg 53.2 kg 53.2 kg    Examination:  General exam: NAD Respiratory system; CTA Cardiovascular system: S 1, S 2 RRR Gastrointestinal system: BS present, soft, nt Central nervous system; alert Extremities:non edema    Data Reviewed: I have personally reviewed following labs and imaging studies  CBC: Recent Labs  Lab 04/19/20 0145 04/20/20 0540  WBC 5.3 4.8  NEUTROABS 2.8 2.3  HGB 12.4 12.6  HCT 39.9 41.5  MCV 84.4 85.2  PLT 126* 154*   Basic Metabolic Panel: Recent Labs  Lab 04/21/20 0643 04/22/20 0119 04/22/20 0747 04/23/20 0653 04/24/20 0425  NA 139  --  141 137 135  K 4.1  --  4.7 5.0 4.0  CL 102  --  104 103 105  CO2 27  --  26 25 23   GLUCOSE 96  --  89 88 88  BUN 38*  --  35* 26* 20  CREATININE 1.12*  --  0.97 0.79 0.76  CALCIUM 11.2* 11.1* 11.4* 10.6* 10.2   GFR: Estimated Creatinine Clearance: 49.7 mL/min (by C-G formula based on SCr of 0.76 mg/dL). Liver Function Tests: No results for input(s): AST, ALT, ALKPHOS, BILITOT, PROT, ALBUMIN in the last 168 hours. No results for input(s): LIPASE, AMYLASE in the last 168 hours. Recent Labs  Lab 04/22/20 0119  AMMONIA 20   Coagulation Profile: No results for input(s): INR, PROTIME in the last 168 hours. Cardiac Enzymes: No results for input(s): CKTOTAL, CKMB, CKMBINDEX, TROPONINI in the last 168 hours. BNP (last 3 results) No results for input(s): PROBNP in the last 8760 hours. HbA1C: No results for input(s): HGBA1C in the last 72 hours. CBG: Recent Labs  Lab 04/21/20 2204 04/22/20 0650  GLUCAP 89 72   Lipid Profile: No results for input(s): CHOL, HDL, LDLCALC, TRIG, CHOLHDL, LDLDIRECT in the last 72 hours. Thyroid Function Tests: No results for input(s): TSH, T4TOTAL, FREET4, T3FREE, THYROIDAB in the last 72 hours. Anemia Panel: No results for input(s): VITAMINB12, FOLATE, FERRITIN, TIBC, IRON, RETICCTPCT in the last 72  hours. Sepsis Labs: No results for input(s): PROCALCITON, LATICACIDVEN in the last 168 hours.  Recent Results (from the past 240 hour(s))  SARS Coronavirus 2 by RT PCR (hospital order, performed in [facility] hospital lab) Nasopharyngeal Nasopharyngeal Swab     Status: None   Collection Time: 04/17/20  9:06 AM   Specimen: Nasopharyngeal Swab  Result Value Ref Range Status   SARS Coronavirus 2 NEGATIVE NEGATIVE Final    Comment: (NOTE) SARS-CoV-2 target nucleic acids are NOT DETECTED.  The SARS-CoV-2 RNA is generally detectable in upper and lower respiratory specimens during the acute phase of infection. The lowest concentration of SARS-CoV-2 viral copies this assay can detect is 250 copies / mL. A negative result does not preclude SARS-CoV-2 infection and should not be used as the sole basis for treatment or other patient management decisions.  A negative result may occur with improper specimen collection / handling, submission of specimen other than nasopharyngeal swab, presence of viral mutation(s) within the areas targeted by this assay, and inadequate number of viral copies (<250 copies / mL). A negative result must be combined with clinical observations, patient history, and epidemiological information.  Fact Sheet for Patients:   StrictlyIdeas.no  Fact Sheet for Healthcare Providers: BankingDealers.co.za  This test is not yet approved or  cleared by the [place] FDA and has been authorized for detection and/or diagnosis of SARS-CoV-2 by FDA under an Emergency Use Authorization (EUA).  This EUA will remain in effect (meaning this test can be used) for the duration of the COVID-19 declaration under Section 564(b)(1) of the Act, 21 U.S.C. section 360bbb-3(b)(1), unless the authorization is terminated or revoked sooner.  Performed at [facility], [place] [address], [place], [place] [address]          Radiology  Studies: No results found.      Scheduled Meds: . aspirin  325 mg Oral Daily  . carbidopa-levodopa  1 tablet Oral TID  . carvedilol  12.5 mg Oral BID WC  . chlorhexidine  15 mL Mouth Rinse BID  . ciprofloxacin  1 drop Right Eye Q4H while awake  . divalproex  125 mg Oral Daily  . donepezil  10 mg Oral Daily  . feeding supplement (ENSURE ENLIVE)  237 mL Oral QID  . hydroxypropyl methylcellulose / hypromellose  1 drop Both Eyes TID  . irbesartan  75 mg Oral Daily  . labetalol  20 mg Intravenous Once  . LORazepam  0.5 mg Oral QPM  . mouth rinse  15 mL Mouth Rinse q12n4p  . multivitamin with minerals  1 tablet Oral Daily  . nystatin  5 mL Oral QID  . polyethylene glycol  17 g Oral Daily  . rosuvastatin  10 mg Oral Daily  . sertraline  100 mg Oral QHS  . sodium chloride flush  3 mL Intravenous Once   Continuous Infusions: . lactated ringers 75 mL/hr at 04/24/20 1401     LOS: 12 days    Time spent: 35 minutes.     Elmarie Shiley, MD [facility]   If 7PM-7AM, please contact night-coverage [email or web address]  04/25/2020, 1:26 PM

## 2020-04-25 NOTE — Plan of Care (Signed)
?  Problem: Coping: ?Goal: Level of anxiety will decrease ?Outcome: Progressing ?  ?Problem: Safety: ?Goal: Ability to remain free from injury will improve ?Outcome: Progressing ?  ?

## 2020-04-26 LAB — PROTEIN ELECTROPHORESIS, SERUM
A/G Ratio: 0.9 (ref 0.7–1.7)
Albumin ELP: 2.9 g/dL (ref 2.9–4.4)
Alpha-1-Globulin: 0.3 g/dL (ref 0.0–0.4)
Alpha-2-Globulin: 0.6 g/dL (ref 0.4–1.0)
Beta Globulin: 1.1 g/dL (ref 0.7–1.3)
Gamma Globulin: 1.4 g/dL (ref 0.4–1.8)
Globulin, Total: 3.4 g/dL (ref 2.2–3.9)
Total Protein ELP: 6.3 g/dL (ref 6.0–8.5)

## 2020-04-26 LAB — BASIC METABOLIC PANEL
Anion gap: 9 (ref 5–15)
BUN: 13 mg/dL (ref 8–23)
CO2: 18 mmol/L — ABNORMAL LOW (ref 22–32)
Calcium: 10.3 mg/dL (ref 8.9–10.3)
Chloride: 107 mmol/L (ref 98–111)
Creatinine, Ser: 0.74 mg/dL (ref 0.44–1.00)
GFR calc Af Amer: >60 mL/min (ref >60)
GFR calc non Af Amer: >60 mL/min (ref >60)
Glucose, Bld: 101 mg/dL — ABNORMAL HIGH (ref 70–99)
Potassium: 3.7 mmol/L (ref 3.5–5.1)
Sodium: 134 mmol/L — ABNORMAL LOW (ref 135–145)

## 2020-04-26 LAB — GLUCOSE, CAPILLARY: Glucose-Capillary: 107 mg/dL — ABNORMAL HIGH (ref 70–99)

## 2020-04-26 MED ORDER — SODIUM CHLORIDE 0.9 % IV SOLN: INTRAVENOUS | Status: DC

## 2020-04-26 NOTE — Progress Notes (Signed)
PROGRESS NOTE    [name]  JKD:[id number] DOB: 1956-08-08 DOA: 04/13/2020 PCP: Lujean Amel, MD   Brief Narrative: 64 year old with past medical history significant for advanced dementia, Parkinson disease, hypertension, hypercalcemia, chronic diastolic heart failure presented with worsening confusion, poor oral intake for 3 days prior to admission.  Patient stopped taking her medication 3 days prior to admission as well.  Patient denies any feeling of depression.  On arrival to the ED patient was severely dehydrated sodium 154, chloride 117, calcium 11.2.  CT head showed chronic cortical right parietal lobe infarct.  No new acute pathology.  CT cervical spine was also unremarkable.  Patient was admitted with a diagnosis of hypovolemic hyponatremia and hypercalcemia and dehydration and altered mental status encephalopathy secondary to dehydration.  Patient was a started on IV fluids, her electrolytes abnormality has resolved improved.  Patient became more alert and back to baseline however she remains intermittently confused due to her baseline dementia.  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 Patient with poor oral intake.  Patient is drinking ensure consistently/  Stable.   2-Acute metabolic encephalopathy: Likely related to hypernatremia: TSH normal. Dr. Einar Grad did peer to peer review with Dr. Amalia Hailey and the recommendation is that patient qualifies for custodial care Social worker working with placement Ammonia 20. RPR negative, B 12 1125  3-Anorexia: Continue Ensure  4--Hypercalcemia likely to dehydration resume IV fluids   Resume IV fluids. Stop lasix.  PTH normal, vitamin D level 34. Check SPEP, UPEP Pending results.  Continue with fluids. Calcium  normal range  5-Hypertension: Continue with Coreg, ibersartan.  Hold Lasix  6-Parkinson dementia; Continue with Sinemet.  7-Oral thrush; nystatin, improved.  8-Moderate caloric malnutrition; in contest of chronic illness 9- Conjunctivitis; start cipro opthalmic  Nutrition Problem: Moderate Malnutrition Etiology: chronic illness (dementia)    Signs/Symptoms: mild muscle depletion, mild fat depletion, moderate fat depletion, moderate muscle depletion, percent weight loss Percent weight loss: 11 %    Interventions: Magic cup, Ensure Enlive (each supplement provides 350kcal and 20 grams of protein)  Estimated body mass index is 22.43 kg/m as calculated from the following:   Height as of this encounter: 5' (1.524 m).   Weight as of this encounter: 52.1 kg.   DVT prophylaxis: SCD, start Lovenox Code Status: full code Family Communication: Disposition Plan:  Status is: Inpatient  Remains inpatient appropriate because:Unsafe d/c plan   Dispo:  Patient From: Home  Planned Disposition: [place]  Expected discharge date: 04/17/20  Medically stable for discharge: Yes         Consultants:   none  Procedures:   none  Antimicrobials:    Subjective: Is alert, she required assistance from the nurses for feeding.  She denies abdominal pain, shortness of breath.  Objective: Vitals:   04/25/20 0949 04/25/20 2051 04/26/20 0523 04/26/20 0910  BP: (!) 170/99 (!) 123/99 (!) 189/110 110/75  Pulse: 61 66 66 80  Resp: 18 18 18 18   Temp: 98.1 F (36.7 C) 98.4 F (36.9 C) 98.7 F (37.1 C) 98.1 F (36.7 C)  TempSrc: Oral Oral Oral Oral  SpO2: 98% 100% 98% 98%  Weight:  52.1  kg    Height:        Intake/Output Summary (Last 24 hours) at 04/26/2020 1446 Last data filed at 04/26/2020 1421 Gross per 24 hour  Intake 1517.15 ml   Output 1350 ml  Net 167.15 ml   Filed Weights   04/21/20 2055 04/23/20 2116 04/25/20 2051  Weight: 53.2 kg 53.2 kg 52.1 kg    Examination:  General exam: NAD Respiratory system; CTA Cardiovascular system: S1-S2 regular rhythm or rate Gastrointestinal system: Bowel sounds present, soft nontender nondistended Central nervous system;  Extremities:non edema    Data Reviewed: I have personally reviewed following labs and imaging studies  CBC: Recent Labs  Lab 04/20/20 0540  WBC 4.8  NEUTROABS 2.3  HGB 12.6  HCT 41.5  MCV 85.2  PLT 338*   Basic Metabolic Panel: Recent Labs  Lab 04/21/20 0643 04/22/20 0119 04/22/20 0747 04/23/20 0653 04/24/20 0425 04/26/20 0711  NA 139  --  141 137 135 134*  K 4.1  --  4.7 5.0 4.0 3.7  CL 102  --  104 103 105 107  CO2 27  --  26 25 23  18*  GLUCOSE 96  --  89 88 88 101*  BUN 38*  --  35* 26* 20 13  CREATININE 1.12*  --  0.97 0.79 0.76 0.74  CALCIUM 11.2* 11.1* 11.4* 10.6* 10.2 10.3   GFR: Estimated Creatinine Clearance: 49.7 mL/min (by C-G formula based on SCr of 0.74 mg/dL). Liver Function Tests: No results for input(s): AST, ALT, ALKPHOS, BILITOT, PROT, ALBUMIN in the last 168 hours. No results for input(s): LIPASE, AMYLASE in the last 168 hours. Recent Labs  Lab 04/22/20 0119  AMMONIA 20   Coagulation Profile: No results for input(s): INR, PROTIME in the last 168 hours. Cardiac Enzymes: No results for input(s): CKTOTAL, CKMB, CKMBINDEX, TROPONINI in the last 168 hours. BNP (last 3 results) No results for input(s): PROBNP in the last 8760 hours. HbA1C: No results for input(s): HGBA1C in the last 72 hours. CBG: Recent Labs  Lab 04/21/20 2204 04/22/20 0650  GLUCAP 89 72   Lipid Profile: No results for input(s): CHOL, HDL, LDLCALC, TRIG, CHOLHDL, LDLDIRECT in the last 72 hours. Thyroid Function Tests: No results for input(s): TSH, T4TOTAL, FREET4, T3FREE, THYROIDAB in the last 72 hours. Anemia Panel: No results  for input(s): VITAMINB12, FOLATE, FERRITIN, TIBC, IRON, RETICCTPCT in the last 72 hours. Sepsis Labs: No results for input(s): PROCALCITON, LATICACIDVEN in the last 168 hours.  Recent Results (from the past 240 hour(s))  SARS Coronavirus 2 by RT PCR (hospital order, performed in [facility] hospital lab) Nasopharyngeal Nasopharyngeal Swab     Status: None   Collection Time: 04/17/20  9:06 AM   Specimen: Nasopharyngeal Swab  Result Value Ref Range Status   SARS Coronavirus 2 NEGATIVE NEGATIVE Final    Comment: (NOTE) SARS-CoV-2 target nucleic acids are NOT DETECTED.  The SARS-CoV-2 RNA is generally detectable in upper and lower respiratory specimens during the acute phase of infection. The lowest concentration of SARS-CoV-2 viral copies this assay can detect is 250 copies / mL. A negative result does not preclude SARS-CoV-2 infection and should not be used as the sole basis for treatment or other patient management decisions.  A negative result may occur with improper specimen collection / handling, submission of specimen other than nasopharyngeal swab, presence of viral mutation(s) within the areas targeted by this assay, and inadequate number of viral copies (<250 copies / mL). A negative result must be combined  with clinical observations, patient history, and epidemiological information.  Fact Sheet for Patients:   StrictlyIdeas.no  Fact Sheet for Healthcare Providers: BankingDealers.co.za  This test is not yet approved or  cleared by the [place] FDA and has been authorized for detection and/or diagnosis of SARS-CoV-2 by FDA under an Emergency Use Authorization (EUA).  This EUA will remain in effect (meaning this test can be used) for the duration of the COVID-19 declaration under Section 564(b)(1) of the Act, 21 U.S.C. section 360bbb-3(b)(1), unless the authorization is terminated or revoked sooner.  Performed at [facility], [place] [address], [place], [place] [address]          Radiology Studies: No results found.      Scheduled Meds: . aspirin  325 mg Oral Daily  . carbidopa-levodopa  1 tablet Oral TID  . carvedilol  12.5 mg Oral BID WC  . chlorhexidine  15 mL Mouth Rinse BID  . ciprofloxacin  1 drop Right Eye Q4H while awake  . divalproex  125 mg Oral Daily  . donepezil  10 mg Oral Daily  . enoxaparin (LOVENOX) injection  40 mg Subcutaneous Q24H  . feeding supplement (ENSURE ENLIVE)  237 mL Oral QID  . hydroxypropyl methylcellulose / hypromellose  1 drop Both Eyes TID  . irbesartan  75 mg Oral Daily  . labetalol  20 mg Intravenous Once  . LORazepam  0.5 mg Oral QPM  . mouth rinse  15 mL Mouth Rinse q12n4p  . megestrol  400 mg Oral Daily  . multivitamin with minerals  1 tablet Oral Daily  . nystatin  5 mL Oral QID  . polyethylene glycol  17 g Oral Daily  . rosuvastatin  10 mg Oral Daily  . sertraline  100 mg Oral QHS  . sodium chloride flush  3 mL Intravenous Once   Continuous Infusions:    LOS: 13 days    Time spent: 35 minutes.     Elmarie Shiley, MD [facility]   If 7PM-7AM, please contact night-coverage [email or web address]  04/26/2020, 2:46 PM

## 2020-04-27 ENCOUNTER — Ambulatory Visit: Payer: Self-pay | Admitting: *Deleted

## 2020-04-27 LAB — BASIC METABOLIC PANEL
Anion gap: 7 (ref 5–15)
BUN: 17 mg/dL (ref 8–23)
CO2: 20 mmol/L — ABNORMAL LOW (ref 22–32)
Calcium: 10.4 mg/dL — ABNORMAL HIGH (ref 8.9–10.3)
Chloride: 109 mmol/L (ref 98–111)
Creatinine, Ser: 0.76 mg/dL (ref 0.44–1.00)
GFR calc Af Amer: >60 mL/min (ref >60)
GFR calc non Af Amer: >60 mL/min (ref >60)
Glucose, Bld: 85 mg/dL (ref 70–99)
Potassium: 3.8 mmol/L (ref 3.5–5.1)
Sodium: 136 mmol/L (ref 135–145)

## 2020-04-27 MED ORDER — SENNOSIDES-DOCUSATE SODIUM 8.6-50 MG PO TABS
1.0000 | ORAL_TABLET | Freq: Two times a day (BID) | ORAL | Status: DC
  Administered 2020-04-27 – 2020-05-28 (×61): 1 via ORAL
  Filled 2020-04-27 (×62): qty 1

## 2020-04-27 NOTE — TOC Progression Note (Signed)
Transition of Care [facility]) - Progression Note    Patient Details  Name: TRESSIA LABRUM MRN: [id number] Date of Birth: 06-May-1956  [facility] CM/SW Contact  Sharlet Salina Mila Homer, LCSW Phone Number: 04/27/2020, 7:02 PM  Clinical Narrative:  CSW attempted to reach Adult Medicaid worker Cherene Altes [phone]) and message left. Will f/u with MA worker on 8/4 and if unable to reach [name], will contact her supervisor.     Expected Discharge Plan: Skilled Nursing Facility Barriers to Discharge: Ship broker, Other (comment) (Initiating facility search)  Expected Discharge Plan and Services Expected Discharge Plan: [place] In-house Referral: Clinical Social Work     Living arrangements for the past 2 months: [place] ([facility])                                       Social Determinants of Health (SDOH) Interventions    Readmission Risk Interventions Readmission Risk Prevention Plan 01/02/2020 12/25/2019  Transportation Screening Complete Complete  PCP or Specialist Appt within 5-7 Days Complete Complete  Home Care Screening Complete Complete  Medication Review (RN CM) Complete Complete  Some recent data might be hidden

## 2020-04-27 NOTE — Progress Notes (Signed)
PROGRESS NOTE    [name]  BTD:[id number] DOB: November 05, 1955 DOA: 04/13/2020 PCP: Lujean Amel, MD   Brief Narrative: 64 year old with past medical history significant for advanced dementia, Parkinson disease, hypertension, hypercalcemia, chronic diastolic heart failure presented with worsening confusion, poor oral intake for 3 days prior to admission.  Patient stopped taking her medication 3 days prior to admission as well.  Patient denies any feeling of depression.  On arrival to the ED patient was severely dehydrated sodium 154, chloride 117, calcium 11.2.  CT head showed chronic cortical right parietal lobe infarct.  No new acute pathology.  CT cervical spine was also unremarkable.  Patient was admitted with a diagnosis of hypovolemic hyponatremia and hypercalcemia and dehydration and altered mental status encephalopathy secondary to dehydration.  Patient was a started on IV fluids, her electrolytes abnormality has resolved improved.  Patient became more alert and back to baseline however she remains intermittently confused due to her baseline dementia.  PT evaluated patient and is recommending a skilled nursing facility  Her calcium increase again, fluids was resume. PTH and Vitamin D normal. Lasix was discontinue. Calcium has normalized again with fluids.  Patient has remain stable, awaiting placement. SW following.   Assessment & Plan:   Active Problems:   Essential hypertension   Dementia due to Parkinson's disease without behavioral disturbance (HCC)   Dehydration, severe   Dehydration   Acute hypernatremia   Hypercalcemia   Malnutrition of moderate degree  1-Severe dehydration with hypernatremia: Patient treated with IV fluids. Patient with poor oral intake.  Patient is drinking ensure consistently/  Stable.   2-Acute metabolic encephalopathy: Likely related to hypernatremia: TSH normal. Dr. Einar Grad did peer to peer review with Dr. Amalia Hailey and the recommendation is that  patient qualifies for custodial care Social worker working with placement, family has to apply for medicaid.  Ammonia 20. RPR negative, B 12 1125  3-Anorexia: Continue Ensure  4--Hypercalcemia likely to dehydration resume IV fluids  Resume IV fluids. Stop lasix.  PTH normal, vitamin D level 34. Check SPEP, UPEP Pending results.  Continue with fluids. Calcium  normal range  5-Hypertension: Continue with Coreg, ibersartan.  Hold Lasix  6-Parkinson dementia; Continue with Sinemet.  7-Oral thrush; nystatin, improved.  8-Moderate caloric malnutrition; in contest of chronic illness 9- Conjunctivitis; start cipro opthalmic Constipation; will order laxative Nutrition Problem: Moderate Malnutrition Etiology: chronic illness (dementia)    Signs/Symptoms: mild muscle depletion, mild fat depletion, moderate fat depletion, moderate muscle depletion, percent weight loss Percent weight loss: 11 %    Interventions: Magic cup, Ensure Enlive (each supplement provides 350kcal and 20 grams of protein)  Estimated body mass index is 22.43 kg/m as calculated from the following:   Height as of this encounter: 5' (1.524 m).   Weight as of this encounter: 52.1 kg.   DVT prophylaxis: SCD, start Lovenox Code Status: full code Family Communication: Disposition Plan:  Status is: Inpatient  Remains inpatient appropriate because:Unsafe d/c plan   Dispo:  Patient From: Home  Planned Disposition: [place]  Expected discharge date: 04/17/20  Medically stable for discharge: Yes         Consultants:   none  Procedures:   none  Antimicrobials:    Subjective: She is alert, report eyes feels better. Eyes with less redness and itching.  No BM in last two days.   Objective: Vitals:   04/26/20 0910 04/26/20 2049 04/27/20 0529 04/27/20 0820  BP: 110/75 (!) 143/84 (!) 162/90 (!) 152/85  Pulse:  80 65 66 69  Resp: 18 18 18 16   Temp: 98.1 F (36.7 C) 99.3 F (37.4  C) 98.8 F (37.1 C) 98.2 F (36.8 C)  TempSrc: Oral Oral  Oral  SpO2: 98% 100% 99% 98%  Weight:      Height:        Intake/Output Summary (Last 24 hours) at 04/27/2020 1528 Last data filed at 04/27/2020 1444 Gross per 24 hour  Intake 2318.63 ml  Output 350 ml  Net 1968.63 ml   Filed Weights   04/21/20 2055 04/23/20 2116 04/25/20 2051  Weight: 53.2 kg 53.2 kg 52.1 kg    Examination:  General exam: NAD, chronic appearing Respiratory system; CTA Cardiovascular system: S 1, S 2 RRR Gastrointestinal system: BS present, soft , nt Central nervous system; alert, follows command Extremities: No edema    Data Reviewed: I have personally reviewed following labs and imaging studies  CBC: No results for input(s): WBC, NEUTROABS, HGB, HCT, MCV, PLT in the last 168 hours. Basic Metabolic Panel: Recent Labs  Lab 04/22/20 0747 04/23/20 0653 04/24/20 0425 04/26/20 0711 04/27/20 0347  NA 141 137 135 134* 136  K 4.7 5.0 4.0 3.7 3.8  CL 104 103 105 107 109  CO2 26 25 23  18* 20*  GLUCOSE 89 88 88 101* 85  BUN 35* 26* 20 13 17   CREATININE 0.97 0.79 0.76 0.74 0.76  CALCIUM 11.4* 10.6* 10.2 10.3 10.4*   GFR: Estimated Creatinine Clearance: 49.7 mL/min (by C-G formula based on SCr of 0.76 mg/dL). Liver Function Tests: No results for input(s): AST, ALT, ALKPHOS, BILITOT, PROT, ALBUMIN in the last 168 hours. No results for input(s): LIPASE, AMYLASE in the last 168 hours. Recent Labs  Lab 04/22/20 0119  AMMONIA 20   Coagulation Profile: No results for input(s): INR, PROTIME in the last 168 hours. Cardiac Enzymes: No results for input(s): CKTOTAL, CKMB, CKMBINDEX, TROPONINI in the last 168 hours. BNP (last 3 results) No results for input(s): PROBNP in the last 8760 hours. HbA1C: No results for input(s): HGBA1C in the last 72 hours. CBG: Recent Labs  Lab 04/21/20 2204 04/22/20 0650 04/26/20 2048  GLUCAP 89 72 107*   Lipid Profile: No results for input(s): CHOL, HDL,  LDLCALC, TRIG, CHOLHDL, LDLDIRECT in the last 72 hours. Thyroid Function Tests: No results for input(s): TSH, T4TOTAL, FREET4, T3FREE, THYROIDAB in the last 72 hours. Anemia Panel: No results for input(s): VITAMINB12, FOLATE, FERRITIN, TIBC, IRON, RETICCTPCT in the last 72 hours. Sepsis Labs: No results for input(s): PROCALCITON, LATICACIDVEN in the last 168 hours.  No results found for this or any previous visit (from the past 240 hour(s)).       Radiology Studies: No results found.      Scheduled Meds: . aspirin  325 mg Oral Daily  . carbidopa-levodopa  1 tablet Oral TID  . carvedilol  12.5 mg Oral BID WC  . chlorhexidine  15 mL Mouth Rinse BID  . ciprofloxacin  1 drop Right Eye Q4H while awake  . divalproex  125 mg Oral Daily  . donepezil  10 mg Oral Daily  . enoxaparin (LOVENOX) injection  40 mg Subcutaneous Q24H  . feeding supplement (ENSURE ENLIVE)  237 mL Oral QID  . hydroxypropyl methylcellulose / hypromellose  1 drop Both Eyes TID  . irbesartan  75 mg Oral Daily  . labetalol  20 mg Intravenous Once  . LORazepam  0.5 mg Oral QPM  . mouth rinse  15 mL Mouth Rinse q12n4p  .  megestrol  400 mg Oral Daily  . multivitamin with minerals  1 tablet Oral Daily  . nystatin  5 mL Oral QID  . polyethylene glycol  17 g Oral Daily  . rosuvastatin  10 mg Oral Daily  . sertraline  100 mg Oral QHS  . sodium chloride flush  3 mL Intravenous Once   Continuous Infusions: . sodium chloride 75 mL/hr at 04/27/20 0817     LOS: 14 days    Time spent: 35 minutes.     Elmarie Shiley, MD [facility]   If 7PM-7AM, please contact night-coverage [email or web address]  04/27/2020, 3:28 PM

## 2020-04-27 NOTE — Plan of Care (Signed)
  Problem: Safety: Goal: Ability to remain free from injury will improve Outcome: Progressing   

## 2020-04-27 NOTE — Progress Notes (Signed)
Physical Therapy Treatment Patient Details Name: [name] MRN: [id number] DOB: 1956/05/09 Today's Date: 04/27/2020    History of Present Illness [name] is a 64 y.o. female with medical history significant of advanced dementia, Parkinson disease, hypertension, hypercalcemia, chronic diastolic CHF, presented with worsening confusion and not eating or drinking x3 days.  Admitted with metabolic encephalopathy and hypernatremia.  Per TOC notes, pt unable to return to ALF due to level of assist, awaiting Medicaid for SNF placement.    PT Comments    Pt was able to follow simple commands and multi step commands with tactile cues.  Session focused on posture training , balance, mobility, and trunk rotation.  Continues to require mod-max A of 2 for transfers and assist to facilitate.  Cont POC.    Follow Up Recommendations  SNF     Equipment Recommendations  Hospital bed;Wheelchair (measurements PT)    Recommendations for Other Services       Precautions / Restrictions Precautions Precautions: Fall    Mobility  Bed Mobility Overal bed mobility: Needs Assistance Bed Mobility: Supine to Sit;Sit to Supine Rolling: Mod assist   Supine to sit: Mod assist;+2 for physical assistance Sit to supine: Mod assist;+2 for physical assistance   General bed mobility comments: Facilitation for transfers and roll by encourageing reaching, bending contralateral leg, and use of bed pad.  Transfers Overall transfer level: Needs assistance Equipment used: 2 person hand held assist Transfers: Sit to/from Stand Sit to Stand: Max assist;+2 physical assistance         General transfer comment: Performed sit to stand x 2 with knees blocked, facilitation with bed pad, use of gatit belt, and assist at shoudlers and hips for posture  Ambulation/Gait             General Gait Details: unable   Stairs             Wheelchair Mobility    Modified Rankin (Stroke Patients  Only)       Balance Overall balance assessment: Needs assistance Sitting-balance support: Feet supported;Bilateral upper extremity supported;No upper extremity supported Sitting balance-Leahy Scale: Fair Sitting balance - Comments: Initially requiring min A and UE support but able to progress to hands on knees and then in lap with SBA.  Sat EOB for 8 minutes.  Worked on LE exercise, weight shifiting, reaching, and rotations.     Standing balance-Leahy Scale: Zero Standing balance comment: Perfomred x 2 for 20 sec each with max A of 2                            Cognition Arousal/Alertness: Awake/alert Behavior During Therapy: Flat affect Overall Cognitive Status: History of cognitive impairments - at baseline                                 General Comments: Pt with slow processing and slow motor planning. benefits from multimodal cues. Pt pleasant and soft spoken.  Followed simple commands with increased time and multi step commands with tactile cues      Exercises General Exercises - Lower Extremity Ankle Circles/Pumps: AAROM;Both;20 reps;Seated Long Arc Quad: AAROM;Both;10 reps;Seated Heel Slides: AAROM;Both;10 reps;Supine Hip ABduction/ADduction: AAROM;Both;10 reps;Supine Other Exercises Other Exercises: Supine: chest press x 10 AROM and reaching across midline for trunk rotation x 5 each side Other Exercises: BIl ankle PF stretch 30 sec    General  Comments General comments (skin integrity, edema, etc.): VSS      Pertinent Vitals/Pain Pain Assessment: No/denies pain    Home Living                      Prior Function            PT Goals (current goals can now be found in the care plan section) Acute Rehab PT Goals Patient Stated Goal: did not state PT Goal Formulation: Patient unable to participate in goal setting Time For Goal Achievement: 04/28/20 Potential to Achieve Goals: Fair Progress towards PT goals: Progressing toward  goals    Frequency    Min 2X/week      PT Plan Current plan remains appropriate    Co-evaluation              AM-PAC PT "6 Clicks" Mobility   Outcome Measure  Help needed turning from your back to your side while in a flat bed without using bedrails?: A Lot Help needed moving from lying on your back to sitting on the side of a flat bed without using bedrails?: A Lot Help needed moving to and from a bed to a chair (including a wheelchair)?: Total Help needed standing up from a chair using your arms (e.g., wheelchair or bedside chair)?: Total Help needed to walk in hospital room?: Total Help needed climbing 3-5 steps with a railing? : Total 6 Click Score: 8    End of Session Equipment Utilized During Treatment: Gait belt Activity Tolerance: Patient tolerated treatment well Patient left: in bed;with bed alarm set;with call bell/phone within reach Nurse Communication: Mobility status PT Visit Diagnosis: Unsteadiness on feet (R26.81);Other abnormalities of gait and mobility (R26.89);Muscle weakness (generalized) (M62.81);History of falling (Z91.81)     Time: 5916-3846 PT Time Calculation (min) (ACUTE ONLY): 23 min  Charges:  $Therapeutic Exercise: 8-22 mins $Therapeutic Activity: 8-22 mins                     Abran Richard, PT Acute Rehab Services Pager [phone] [facility] [place] 04/27/2020, 5:14 PM

## 2020-04-27 NOTE — Progress Notes (Signed)
Nutrition Follow-up  DOCUMENTATION CODES:   Non-severe (moderate) malnutrition in context of chronic illness  INTERVENTION:  Continue Ensure Enlive po QID, each supplement provides 350 kcal and 20 grams of protein  Continue Magic cup TID with meals, each supplement provides 290 kcal and 9 grams of protein  Continue MVI daily   NUTRITION DIAGNOSIS:   Moderate Malnutrition related to chronic illness (dementia) as evidenced by mild muscle depletion, mild fat depletion, moderate fat depletion, moderate muscle depletion, percent weight loss.  Ongoing  GOAL:   Patient will meet greater than or equal to 90% of their needs  Progressing  MONITOR:   PO intake, Supplement acceptance, Weight trends, Labs, I & O's  REASON FOR ASSESSMENT:   Consult Assessment of nutrition requirement/status  ASSESSMENT:   Pt admitted with severe dehydration and hypernatremia and acute encephalopathy. PMH includes advanced dementia, Parkinson disease, HTN, hypercalcemia, CHF.  Pt's appetite and meal consumption remain poor; however, pt is drinking Ensures consistently and continues to eat Magic Cup intermittently per RN. Pt did agree to begin taking Megace. Pt pending placement in SNF.   Labs reviewed. Medications: Ensure Enlive QID, Megace, MVI, Miralax  Diet Order:   Diet Order            DIET DYS 3 Room service appropriate? Yes; Fluid consistency: Thin  Diet effective now                 EDUCATION NEEDS:   No education needs have been identified at this time  Skin:  Skin Assessment: Skin Integrity Issues: Skin Integrity Issues:: Other (Comment) Other: MASD perineum, sacrum  Last BM:  8/1  Height:   Ht Readings from Last 1 Encounters:  04/13/20 5' (1.524 m)    Weight:   Wt Readings from Last 1 Encounters:  04/25/20 52.1 kg    BMI:  Body mass index is 22.43 kg/m.  Estimated Nutritional Needs:   Kcal:  1400-1600  Protein:  70-85 grams  Fluid:   >1.4L/d    Larkin Ina, MS, RD, LDN RD pager number and weekend/on-call pager number located in [place].

## 2020-04-28 LAB — UPEP/UIFE/LIGHT CHAINS/TP, 24-HR UR
Free Kappa Lt Chains,Ur: 18.13 mg/L (ref 0.63–113.79)
Free Kappa/Lambda Ratio: 6.21 (ref 1.03–31.76)
Free Lambda Lt Chains,Ur: 2.92 mg/L (ref 0.47–11.77)
Total Protein, Urine-Ur/day: 161 mg/24 hr — ABNORMAL HIGH (ref 30–150)
Total Protein, Urine: 12.9 mg/dL
Total Volume: 1250

## 2020-04-28 LAB — CBC
HCT: 37.2 % (ref 36.0–46.0)
Hemoglobin: 11.5 g/dL — ABNORMAL LOW (ref 12.0–15.0)
MCH: 25.8 pg — ABNORMAL LOW (ref 26.0–34.0)
MCHC: 30.9 g/dL (ref 30.0–36.0)
MCV: 83.4 fL (ref 80.0–100.0)
Platelets: 187 10*3/uL (ref 150–400)
RBC: 4.46 MIL/uL (ref 3.87–5.11)
RDW: 19.1 % — ABNORMAL HIGH (ref 11.5–15.5)
WBC: 3.8 10*3/uL — ABNORMAL LOW (ref 4.0–10.5)
nRBC: 0 % (ref 0.0–0.2)

## 2020-04-28 LAB — BASIC METABOLIC PANEL
Anion gap: 9 (ref 5–15)
BUN: 14 mg/dL (ref 8–23)
CO2: 19 mmol/L — ABNORMAL LOW (ref 22–32)
Calcium: 10.5 mg/dL — ABNORMAL HIGH (ref 8.9–10.3)
Chloride: 110 mmol/L (ref 98–111)
Creatinine, Ser: 0.69 mg/dL (ref 0.44–1.00)
GFR calc Af Amer: >60 mL/min (ref >60)
GFR calc non Af Amer: >60 mL/min (ref >60)
Glucose, Bld: 94 mg/dL (ref 70–99)
Potassium: 4 mmol/L (ref 3.5–5.1)
Sodium: 138 mmol/L (ref 135–145)

## 2020-04-28 NOTE — Progress Notes (Signed)
PROGRESS NOTE    [name]  DXA:[id number] DOB: [birth year] DOA: 04/13/2020 PCP: Lujean Amel, MD   Brief Narrative: 64 year old with past medical history significant for advanced dementia, Parkinson disease, hypertension, hypercalcemia, chronic diastolic heart failure presented with worsening confusion, poor oral intake for 3 days prior to admission.  Patient stopped taking her medication 3 days prior to admission as well.  On arrival to the ED patient was severely dehydrated sodium 154, chloride 117, calcium 11.2.  CT head showed chronic cortical right parietal lobe infarct, no acute findings. Patient was admitted with a diagnosis of hypovolemic hyponatremia and hypercalcemia and dehydration and metabolic encephalopathy.  Patient was a started on IV fluids, her electrolytes abnormality has resolved improved.  Patient became more alert and back to baseline however she remains intermittently confused due to her baseline dementia.  PT evaluated patient and is recommending a skilled nursing facility.  Her calcium level trended up again, PTH and vitamin D was noted to be normal, Lasix was then discontinued, as calcium has since normalized. -Remained stable since then, currently awaiting placement  Assessment & Plan:   Severe dehydration with hypernatremia: -Admitted with dehydration and multiple electrolyte abnormalities in the setting of dementia and Parkinson's disease -Improved, hydrated with IV fluids, electrolytes stabilized -Oral intake is improving, drinking Ensure daily  Acute metabolic encephalopathy:  -Likely related to hypernatremia and dehydration TSH normal, ammonia level is 20, RPR negative, B12 is normal, no infectious process noted as well -Mental status has improved, PT OT recommended rehab, this was declined by her insurance agency Dr. Doristine Bosworth did peer to peer review with Dr. Amalia Hailey and the recommendation is that patient qualifies for custodial care Social worker working  with placement, family has to apply for medicaid.   Anorexia:  Severe protein calorie malnutrition  -continue Ensure  Hypercalcemia likely to dehydration -Improved, PTH normal, vitamin D level 34. Check SPEP, UPEP Pending results.  -Discontinue IV fluids today  Hypertension: Continue with Coreg, ibersartan.  Hold Lasix  Parkinson dementia; Continue with Sinemet.  Oral thrush; nystatin, improved.   Conjunctivitis; start cipro ophthalmic\  DVT prophylaxis:  Lovenox Code Status: full code Family Communication: No family at bedside Disposition Plan:  Status is: Inpatient  Remains inpatient appropriate because: Remains medically stable for discharge, awaiting safe discharge plans:Unsafe d/c plan   Dispo:  Patient From: Home  Planned Disposition: [place]  Expected discharge date: To be determined  Medically stable for discharge: Yes         Consultants:   none  Procedures:   none  Antimicrobials:    Subjective: She is alert, report eyes feels better. Eyes with less redness and itching.  No BM in last two days.   Objective: Vitals:   04/27/20 2104 04/28/20 0221 04/28/20 0600 04/28/20 0848  BP: (!) 174/95 (!) 158/96 (!) 168/96 (!) 152/88  Pulse: 63 63 61 69  Resp:   18 18  Temp: 98.9 F (37.2 C)  98.6 F (37 C)   TempSrc: Oral  Oral   SpO2: 100% 100% 100% 96%  Weight:      Height:        Intake/Output Summary (Last 24 hours) at 04/28/2020 1208 Last data filed at 04/28/2020 0700 Gross per 24 hour  Intake 1806.78 ml  Output 2000 ml  Net -193.22 ml   Filed Weights   04/21/20 2055 04/23/20 2116 04/25/20 2051  Weight: 53.2 kg 53.2 kg 52.1 kg    Examination:  General exam: NAD, chronic appearing  Respiratory system; CTA Cardiovascular system: S 1, S 2 RRR Gastrointestinal system: BS present, soft , nt Central nervous system; alert, follows command Extremities: No edema    Data Reviewed: I have personally reviewed following  labs and imaging studies  CBC: Recent Labs  Lab 04/28/20 0611  WBC 3.8*  HGB 11.5*  HCT 37.2  MCV 83.4  PLT 188   Basic Metabolic Panel: Recent Labs  Lab 04/23/20 0653 04/24/20 0425 04/26/20 0711 04/27/20 0347 04/28/20 0611  NA 137 135 134* 136 138  K 5.0 4.0 3.7 3.8 4.0  CL 103 105 107 109 110  CO2 25 23 18* 20* 19*  GLUCOSE 88 88 101* 85 94  BUN 26* 20 13 17 14   CREATININE 0.79 0.76 0.74 0.76 0.69  CALCIUM 10.6* 10.2 10.3 10.4* 10.5*   GFR: Estimated Creatinine Clearance: 49.7 mL/min (by C-G formula based on SCr of 0.69 mg/dL). Liver Function Tests: No results for input(s): AST, ALT, ALKPHOS, BILITOT, PROT, ALBUMIN in the last 168 hours. No results for input(s): LIPASE, AMYLASE in the last 168 hours. Recent Labs  Lab 04/22/20 0119  AMMONIA 20   Coagulation Profile: No results for input(s): INR, PROTIME in the last 168 hours. Cardiac Enzymes: No results for input(s): CKTOTAL, CKMB, CKMBINDEX, TROPONINI in the last 168 hours. BNP (last 3 results) No results for input(s): PROBNP in the last 8760 hours. HbA1C: No results for input(s): HGBA1C in the last 72 hours. CBG: Recent Labs  Lab 04/21/20 2204 04/22/20 0650 04/26/20 2048  GLUCAP 89 72 107*   Lipid Profile: No results for input(s): CHOL, HDL, LDLCALC, TRIG, CHOLHDL, LDLDIRECT in the last 72 hours. Thyroid Function Tests: No results for input(s): TSH, T4TOTAL, FREET4, T3FREE, THYROIDAB in the last 72 hours. Anemia Panel: No results for input(s): VITAMINB12, FOLATE, FERRITIN, TIBC, IRON, RETICCTPCT in the last 72 hours. Sepsis Labs: No results for input(s): PROCALCITON, LATICACIDVEN in the last 168 hours.  No results found for this or any previous visit (from the past 240 hour(s)).       Radiology Studies: No results found.      Scheduled Meds: . aspirin  325 mg Oral Daily  . carbidopa-levodopa  1 tablet Oral TID  . carvedilol  12.5 mg Oral BID WC  . chlorhexidine  15 mL Mouth Rinse BID   . ciprofloxacin  1 drop Right Eye Q4H while awake  . divalproex  125 mg Oral Daily  . donepezil  10 mg Oral Daily  . enoxaparin (LOVENOX) injection  40 mg Subcutaneous Q24H  . feeding supplement (ENSURE ENLIVE)  237 mL Oral QID  . hydroxypropyl methylcellulose / hypromellose  1 drop Both Eyes TID  . irbesartan  75 mg Oral Daily  . labetalol  20 mg Intravenous Once  . LORazepam  0.5 mg Oral QPM  . mouth rinse  15 mL Mouth Rinse q12n4p  . megestrol  400 mg Oral Daily  . multivitamin with minerals  1 tablet Oral Daily  . nystatin  5 mL Oral QID  . polyethylene glycol  17 g Oral Daily  . rosuvastatin  10 mg Oral Daily  . senna-docusate  1 tablet Oral BID  . sertraline  100 mg Oral QHS  . sodium chloride flush  3 mL Intravenous Once   Continuous Infusions:    LOS: 15 days    Time spent: 25 minutes.   Domenic Polite, MD [facility]   04/28/2020, 12:08 PM

## 2020-04-28 NOTE — Plan of Care (Signed)
  Problem: Safety: Goal: Ability to remain free from injury will improve Outcome: Progressing   

## 2020-04-28 NOTE — Progress Notes (Signed)
Occupational Therapy Treatment Patient Details Name: [name] MRN: [id number] DOB: 03-27-1956 Today's Date: 04/28/2020    History of present illness [name] is a 64 y.o. female with medical history significant of advanced dementia, Parkinson disease, hypertension, hypercalcemia, chronic diastolic CHF, presented with worsening confusion and not eating or drinking x3 days.  Admitted with metabolic encephalopathy and hypernatremia.  Per TOC notes, pt unable to return to ALF due to level of assist, awaiting Medicaid for SNF placement.   OT comments  Pt is self feeding finger foods and drinks, needs assist for use of utensils. Participated in grooming activity seated at EOB. Pt requires max to total assist for supine<>sit and is able to sit with min guard assist once she gains her balance. Pt continues to be appropriate for SNF.   Follow Up Recommendations  SNF;Supervision/Assistance - 24 hour    Equipment Recommendations   (defer to next venue)    Recommendations for Other Services      Precautions / Restrictions Precautions Precautions: Fall       Mobility Bed Mobility Overal bed mobility: Needs Assistance Bed Mobility: Supine to Sit;Sit to Supine     Supine to sit: Max assist Sit to supine: Total assist   General bed mobility comments: assist for all aspects, initiates sitting EOB by lifting trunk from elevated HOB  Transfers                      Balance Overall balance assessment: Needs assistance   Sitting balance-Leahy Scale: Poor Sitting balance - Comments: initially needing mod assist due to posterior lean, progressed to min guard Postural control: Posterior lean                                 ADL either performed or assessed with clinical judgement   ADL Overall ADL's : Needs assistance/impaired Eating/Feeding: Minimal assistance;Sitting Eating/Feeding Details (indicate cue type and reason): finger foods and drinks, assist for  use of utensils Grooming: Wash/dry face;Sitting;Min guard Grooming Details (indicate cue type and reason): decreased thoroughness                                     Vision       Perception     Praxis      Cognition Arousal/Alertness: Awake/alert Behavior During Therapy: Flat affect Overall Cognitive Status: History of cognitive impairments - at baseline                                          Exercises     Shoulder Instructions       General Comments      Pertinent Vitals/ Pain       Pain Assessment: No/denies pain  Home Living                                          Prior Functioning/Environment              Frequency  Min 2X/week        Progress Toward Goals  OT Goals(current goals can now be found in the care plan section)  Progress towards OT goals: Not progressing toward goals - comment  Acute Rehab OT Goals Patient Stated Goal: did not state  Plan Discharge plan remains appropriate    Co-evaluation                 AM-PAC OT "6 Clicks" Daily Activity     Outcome Measure   Help from another person eating meals?: A Little Help from another person taking care of personal grooming?: A Lot Help from another person toileting, which includes using toliet, bedpan, or urinal?: Total Help from another person bathing (including washing, rinsing, drying)?: Total Help from another person to put on and taking off regular upper body clothing?: A Lot Help from another person to put on and taking off regular lower body clothing?: Total 6 Click Score: 10    End of Session    OT Visit Diagnosis: Unsteadiness on feet (R26.81);Other abnormalities of gait and mobility (R26.89);Muscle weakness (generalized) (M62.81);Other symptoms and signs involving cognitive function   Activity Tolerance Patient tolerated treatment well   Patient Left in bed;with call bell/phone within reach;with bed alarm set    Nurse Communication          Time: [phone] OT Time Calculation (min): 24 min  Charges: OT General Charges $OT Visit: 1 Visit OT Treatments $Self Care/Home Management : 23-37 mins  Nestor Lewandowsky, OTR/L Acute Rehabilitation Services Pager: [phone] Office: [phone]   Malka So 04/28/2020, 3:17 PM

## 2020-04-29 DIAGNOSIS — E860 Dehydration: Secondary | ICD-10-CM | POA: Diagnosis not present

## 2020-04-29 DIAGNOSIS — E870 Hyperosmolality and hypernatremia: Secondary | ICD-10-CM | POA: Diagnosis not present

## 2020-04-29 LAB — COMPREHENSIVE METABOLIC PANEL
ALT: 11 U/L (ref 0–44)
AST: 25 U/L (ref 15–41)
Albumin: 3 g/dL — ABNORMAL LOW (ref 3.5–5.0)
Alkaline Phosphatase: 71 U/L (ref 38–126)
Anion gap: 9 (ref 5–15)
BUN: 16 mg/dL (ref 8–23)
CO2: 21 mmol/L — ABNORMAL LOW (ref 22–32)
Calcium: 10.8 mg/dL — ABNORMAL HIGH (ref 8.9–10.3)
Chloride: 107 mmol/L (ref 98–111)
Creatinine, Ser: 0.9 mg/dL (ref 0.44–1.00)
GFR calc Af Amer: >60 mL/min (ref >60)
GFR calc non Af Amer: >60 mL/min (ref >60)
Glucose, Bld: 85 mg/dL (ref 70–99)
Potassium: 4.2 mmol/L (ref 3.5–5.1)
Sodium: 137 mmol/L (ref 135–145)
Total Bilirubin: 0.6 mg/dL (ref 0.3–1.2)
Total Protein: 7.1 g/dL (ref 6.5–8.1)

## 2020-04-29 NOTE — Progress Notes (Signed)
PROGRESS NOTE    [name]  BZJ:[id number] DOB: August 31, 1956 DOA: 04/13/2020 PCP: Lujean Amel, MD   Brief Narrative: 64 year old with past medical history significant for advanced dementia, Parkinson disease, hypertension, hypercalcemia, chronic diastolic heart failure presented with worsening confusion, poor oral intake for 3 days prior to admission.  Patient stopped taking her medication 3 days prior to admission as well.  On arrival to the ED patient was severely dehydrated sodium 154, chloride 117, calcium 11.2.  CT head showed chronic cortical right parietal lobe infarct, no acute findings. Patient was admitted with a diagnosis of hypovolemic hyponatremia and hypercalcemia and dehydration and metabolic encephalopathy.  Patient was a started on IV fluids, her electrolytes abnormality has resolved improved.  Patient became more alert and back to baseline however she remains intermittently confused due to her baseline dementia.  PT evaluated patient and is recommending a skilled nursing facility.  Her calcium level trended up again, PTH and vitamin D was noted to be normal, Lasix was then discontinued, as calcium has since normalized. -Remained stable since then, currently awaiting placement  Assessment & Plan:   Severe dehydration with hypernatremia: -Admitted with dehydration and multiple electrolyte abnormalities in the setting of dementia and Parkinson's disease -Improved, hydrated with IV fluids, electrolytes stabilized -Oral intake is improving, drinking Ensure daily -stable -Disposition remains challenging, Education officer, museum following  Acute metabolic encephalopathy:  -Likely related to hypernatremia, hypercalcemia and dehydration TSH normal, ammonia level is 20, RPR negative, B12 is normal, no infectious process noted as well -Mental status has improved, PT OT recommended rehab, this was declined by her insurance agency Dr. Doristine Bosworth did peer to peer review with Dr. Amalia Hailey and the  recommendation is that patient qualifies for custodial care Social worker working with placement, family has to apply for [employer]   Anorexia:  Severe protein calorie malnutrition  -continue Ensure  Hypercalcemia likely to dehydration -Improved, PTH normal, vitamin D level 34. -SPEP without monoclonal protein -Check PTH RP -Discontinue IV fluids today  Hypertension: Continue with Coreg, ibersartan.  Hold Lasix  Parkinson dementia; Continue with Sinemet.  Oral thrush; nystatin, improved.   Conjunctivitis; start cipro ophthalmic\  DVT prophylaxis:  Lovenox Code Status: full code Family Communication: No family at bedside Disposition Plan:  Status is: Inpatient  Remains inpatient appropriate because: Remains medically stable for discharge, awaiting safe discharge plans:Unsafe d/c plan   Dispo:  Patient From: Home  Planned Disposition: [place]  Expected discharge date: To be determined  Medically stable for discharge: Yes   Consultants:   none  Procedures:   none  Antimicrobials:    Subjective: -No complaints, no events overnight, eating a little better overall  Objective: Vitals:   04/28/20 0848 04/28/20 2116 04/29/20 0513 04/29/20 0927  BP: (!) 152/88 (!) 169/95 (!) 158/70 (!) 106/93  Pulse: 69 (!) 58 61 (!) 56  Resp: 18 18 18 14   Temp:  99 F (37.2 C) 98.9 F (37.2 C) 98.6 F (37 C)  TempSrc:  Oral Oral Oral  SpO2: 96% 95% 96% 98%  Weight:      Height:        Intake/Output Summary (Last 24 hours) at 04/29/2020 1225 Last data filed at 04/29/2020 0900 Gross per 24 hour  Intake 960 ml  Output 1750 ml  Net -790 ml   Filed Weights   04/21/20 2055 04/23/20 2116 04/25/20 2051  Weight: 53.2 kg 53.2 kg 52.1 kg    Examination:  General exam: Elderly chronically ill female sitting up  in bed, AAO x2, no distress HEENT: No JVD CVS: S1-S2, regular rate rhythm Lungs: Clear bilaterally Abdomen: Soft, nontender, bowel sounds  present Extremities: No edema Neuro: Moves all extremities, no localizing signs   Data Reviewed: I have personally reviewed following labs and imaging studies  CBC: Recent Labs  Lab 04/28/20 0611  WBC 3.8*  HGB 11.5*  HCT 37.2  MCV 83.4  PLT 732   Basic Metabolic Panel: Recent Labs  Lab 04/24/20 0425 04/26/20 0711 04/27/20 0347 04/28/20 0611 04/29/20 0351  NA 135 134* 136 138 137  K 4.0 3.7 3.8 4.0 4.2  CL 105 107 109 110 107  CO2 23 18* 20* 19* 21*  GLUCOSE 88 101* 85 94 85  BUN 20 13 17 14 16   CREATININE 0.76 0.74 0.76 0.69 0.90  CALCIUM 10.2 10.3 10.4* 10.5* 10.8*   GFR: Estimated Creatinine Clearance: 44.2 mL/min (by C-G formula based on SCr of 0.9 mg/dL). Liver Function Tests: Recent Labs  Lab 04/29/20 0351  AST 25  ALT 11  ALKPHOS 71  BILITOT 0.6  PROT 7.1  ALBUMIN 3.0*   No results for input(s): LIPASE, AMYLASE in the last 168 hours. No results for input(s): AMMONIA in the last 168 hours. Coagulation Profile: No results for input(s): INR, PROTIME in the last 168 hours. Cardiac Enzymes: No results for input(s): CKTOTAL, CKMB, CKMBINDEX, TROPONINI in the last 168 hours. BNP (last 3 results) No results for input(s): PROBNP in the last 8760 hours. HbA1C: No results for input(s): HGBA1C in the last 72 hours. CBG: Recent Labs  Lab 04/26/20 2048  GLUCAP 107*   Lipid Profile: No results for input(s): CHOL, HDL, LDLCALC, TRIG, CHOLHDL, LDLDIRECT in the last 72 hours. Thyroid Function Tests: No results for input(s): TSH, T4TOTAL, FREET4, T3FREE, THYROIDAB in the last 72 hours. Anemia Panel: No results for input(s): VITAMINB12, FOLATE, FERRITIN, TIBC, IRON, RETICCTPCT in the last 72 hours. Sepsis Labs: No results for input(s): PROCALCITON, LATICACIDVEN in the last 168 hours.  No results found for this or any previous visit (from the past 240 hour(s)).       Radiology Studies: No results found.      Scheduled Meds: . aspirin  325 mg  Oral Daily  . carbidopa-levodopa  1 tablet Oral TID  . carvedilol  12.5 mg Oral BID WC  . chlorhexidine  15 mL Mouth Rinse BID  . ciprofloxacin  1 drop Right Eye Q4H while awake  . divalproex  125 mg Oral Daily  . donepezil  10 mg Oral Daily  . enoxaparin (LOVENOX) injection  40 mg Subcutaneous Q24H  . feeding supplement (ENSURE ENLIVE)  237 mL Oral QID  . hydroxypropyl methylcellulose / hypromellose  1 drop Both Eyes TID  . irbesartan  75 mg Oral Daily  . labetalol  20 mg Intravenous Once  . LORazepam  0.5 mg Oral QPM  . mouth rinse  15 mL Mouth Rinse q12n4p  . megestrol  400 mg Oral Daily  . multivitamin with minerals  1 tablet Oral Daily  . nystatin  5 mL Oral QID  . polyethylene glycol  17 g Oral Daily  . rosuvastatin  10 mg Oral Daily  . senna-docusate  1 tablet Oral BID  . sertraline  100 mg Oral QHS  . sodium chloride flush  3 mL Intravenous Once   Continuous Infusions:    LOS: 16 days    Time spent: 25 minutes.   Domenic Polite, MD [facility]   04/29/2020, 12:25 PM

## 2020-04-29 NOTE — Clinical Social Work Note (Signed)
CSW continuing calling Medicaid worker and her supervisor as trying to confirm if patient has Medicaid as this will determine what type placement assistance that can be provided. Call made to Adult Medicaid worker Cherene Altes on 8/3 and no return call received. Call made to her supervisor Sharmon Leyden [phone]) and message left. CSW will continue attempts to reach [facility] worker/supervisor.  Yaser Harvill Givens, MSW, LCSW Licensed Clinical Social Worker [place] [phone]

## 2020-04-30 ENCOUNTER — Ambulatory Visit: Payer: Self-pay | Admitting: *Deleted

## 2020-04-30 DIAGNOSIS — E860 Dehydration: Secondary | ICD-10-CM | POA: Diagnosis not present

## 2020-04-30 DIAGNOSIS — R627 Adult failure to thrive: Secondary | ICD-10-CM | POA: Diagnosis not present

## 2020-04-30 DIAGNOSIS — E870 Hyperosmolality and hypernatremia: Secondary | ICD-10-CM | POA: Diagnosis not present

## 2020-04-30 LAB — COMPREHENSIVE METABOLIC PANEL
ALT: 14 U/L (ref 0–44)
AST: 24 U/L (ref 15–41)
Albumin: 2.9 g/dL — ABNORMAL LOW (ref 3.5–5.0)
Alkaline Phosphatase: 74 U/L (ref 38–126)
Anion gap: 10 (ref 5–15)
BUN: 25 mg/dL — ABNORMAL HIGH (ref 8–23)
CO2: 21 mmol/L — ABNORMAL LOW (ref 22–32)
Calcium: 10.9 mg/dL — ABNORMAL HIGH (ref 8.9–10.3)
Chloride: 105 mmol/L (ref 98–111)
Creatinine, Ser: 0.9 mg/dL (ref 0.44–1.00)
GFR calc Af Amer: >60 mL/min (ref >60)
GFR calc non Af Amer: >60 mL/min (ref >60)
Glucose, Bld: 86 mg/dL (ref 70–99)
Potassium: 4.3 mmol/L (ref 3.5–5.1)
Sodium: 136 mmol/L (ref 135–145)
Total Bilirubin: 0.3 mg/dL (ref 0.3–1.2)
Total Protein: 6.8 g/dL (ref 6.5–8.1)

## 2020-04-30 MED ORDER — ZOLEDRONIC ACID 4 MG/5ML IV CONC
4.0000 mg | Freq: Once | INTRAVENOUS | Status: AC
  Administered 2020-04-30: 4 mg via INTRAVENOUS
  Filled 2020-04-30: qty 5

## 2020-04-30 NOTE — TOC Progression Note (Addendum)
Transition of Care [facility]) - Progression Note    Patient Details  Name: [name] MRN: [id number] Date of Birth: 09-10-1956  Transition of Care [facility]) CM/SW Contact  Sharlet Salina Mila Homer, LCSW Phone Number: 04/30/2020, 10:59 AM  Clinical Narrative: CSW talked with Adult Medicaid Worker Cherene Altes [phone]) regarding patient and assisting her in applying for LTC Medicaid.  Per [name], patient currently receiving Medicare premium assistance. She added that for LTC a State approved [place] needs to be completed and sent to the state. CSW guided to [place]. CSW advised that this form would be sent to the [facility], however she was unable to provide any other information on specifically where to send the form. [name] provided CSW with her email if CSW has any other questions: [email or web address].  Call made to [facility] financial counselor Wilburn Cornelia [phone]) regarding patient patient and the State [place] form. Elie Confer indicated that she would check on this and get back with CSW.         Expected Discharge Plan: Skilled Nursing Facility Barriers to Discharge: Ship broker, Other (comment) (Initiating facility search)  Expected Discharge Plan and Services Expected Discharge Plan: [place] In-house Referral: Clinical Social Work     Living arrangements for the past 2 months: [place] ([facility])                                       Social Determinants of Health (SDOH) Interventions    Readmission Risk Interventions Readmission Risk Prevention Plan 01/02/2020 12/25/2019  Transportation Screening Complete Complete  PCP or Specialist Appt within 5-7 Days Complete Complete  Home Care Screening Complete Complete  Medication Review (RN CM) Complete Complete  Some recent data might be hidden

## 2020-04-30 NOTE — Progress Notes (Signed)
Physical Therapy Treatment Patient Details Name: BENJAMIN MERRIHEW MRN: [id number] DOB: 1955/11/23 Today's Date: 04/30/2020    History of Present Illness TANYSHA QUANT is a 64 y.o. female with medical history significant of advanced dementia, Parkinson disease, hypertension, hypercalcemia, chronic diastolic CHF, presented with worsening confusion and not eating or drinking x3 days.  Admitted with metabolic encephalopathy and hypernatremia.  Per TOC notes, pt unable to return to ALF due to level of assist, awaiting Medicaid for SNF placement.    PT Comments    Pt remains very limited overall with mobility secondary to weakness, fatigue and cognitive deficits. She required max A for bed mobility and max A x2 with use of STEDY for transfers. Pt would continue to benefit from skilled physical therapy services at this time while admitted and after d/c to address the below listed limitations in order to improve overall safety and independence with functional mobility.    Follow Up Recommendations  SNF     Equipment Recommendations  Hospital bed;Wheelchair (measurements PT)    Recommendations for Other Services       Precautions / Restrictions Precautions Precautions: Fall Restrictions Weight Bearing Restrictions: No    Mobility  Bed Mobility Overal bed mobility: Needs Assistance Bed Mobility: Supine to Sit     Supine to sit: Max assist     General bed mobility comments: assistance needed to move bilateral LEs off of bed and for trunk elevation, use of bed pads to scoot pt's hips forwards toward EOB  Transfers Overall transfer level: Needs assistance   Transfers: Sit to/from Stand Sit to Stand: Max assist;+2 physical assistance         General transfer comment: use of gait belt and bed pad with max A x2 needed to obtain full upright standing with STEDY  Ambulation/Gait             General Gait Details: unable   Stairs             Wheelchair Mobility     Modified Rankin (Stroke Patients Only)       Balance Overall balance assessment: Needs assistance Sitting-balance support: Feet supported;Bilateral upper extremity supported;No upper extremity supported Sitting balance-Leahy Scale: Poor     Standing balance support: Bilateral upper extremity supported Standing balance-Leahy Scale: Zero                              Cognition Arousal/Alertness: Awake/alert Behavior During Therapy: Flat affect Overall Cognitive Status: Impaired/Different from baseline Area of Impairment: Attention;Memory;Following commands;Safety/judgement;Awareness;Problem solving                   Current Attention Level: Sustained Memory: Decreased short-term memory Following Commands: Follows one step commands inconsistently Safety/Judgement: Decreased awareness of safety;Decreased awareness of deficits Awareness: Intellectual Problem Solving: Slow processing;Decreased initiation;Difficulty sequencing;Requires verbal cues;Requires tactile cues        Exercises      General Comments        Pertinent Vitals/Pain Pain Assessment: No/denies pain    Home Living                      Prior Function            PT Goals (current goals can now be found in the care plan section) Acute Rehab PT Goals PT Goal Formulation: Patient unable to participate in goal setting Time For Goal Achievement: 05/14/20 Potential to Achieve Goals: Fair Progress  towards PT goals: Progressing toward goals    Frequency    Min 2X/week      PT Plan Current plan remains appropriate    Co-evaluation              AM-PAC PT "6 Clicks" Mobility   Outcome Measure  Help needed turning from your back to your side while in a flat bed without using bedrails?: A Lot Help needed moving from lying on your back to sitting on the side of a flat bed without using bedrails?: A Lot Help needed moving to and from a bed to a chair (including a  wheelchair)?: Total Help needed standing up from a chair using your arms (e.g., wheelchair or bedside chair)?: A Lot Help needed to walk in hospital room?: Total Help needed climbing 3-5 steps with a railing? : Total 6 Click Score: 9    End of Session Equipment Utilized During Treatment: Gait belt Activity Tolerance: Patient tolerated treatment well Patient left: in chair;with call bell/phone within reach;with chair alarm set Nurse Communication: Mobility status PT Visit Diagnosis: Unsteadiness on feet (R26.81);Other abnormalities of gait and mobility (R26.89);Muscle weakness (generalized) (M62.81);History of falling (Z91.81)     Time: 7445-1460 PT Time Calculation (min) (ACUTE ONLY): 18 min  Charges:  $Therapeutic Activity: 8-22 mins                     Anastasio Champion, DPT  Acute Rehabilitation Services Pager [phone] Office [place] 04/30/2020, 1:05 PM

## 2020-04-30 NOTE — Plan of Care (Signed)
?  Problem: Coping: ?Goal: Level of anxiety will decrease ?Outcome: Progressing ?  ?Problem: Safety: ?Goal: Ability to remain free from injury will improve ?Outcome: Progressing ?  ?

## 2020-04-30 NOTE — Progress Notes (Signed)
PROGRESS NOTE    [name]  WIO:[id number] DOB: December 05, 1955 DOA: 04/13/2020 PCP: Lujean Amel, MD   Brief Narrative: 64 year old with past medical history significant for advanced dementia, Parkinson disease, hypertension, hypercalcemia, chronic diastolic heart failure presented with worsening confusion, poor oral intake for 3 days prior to admission.  Patient stopped taking her medication 3 days prior to admission as well.  On arrival to the ED patient was severely dehydrated sodium 154, chloride 117, calcium 11.2.  CT head showed chronic cortical right parietal lobe infarct, no acute findings. Patient was admitted with a diagnosis of hypovolemic hyponatremia and hypercalcemia and dehydration and metabolic encephalopathy.  Patient was a started on IV fluids, her electrolytes abnormality has resolved improved.  Patient became more alert and back to baseline however she remains intermittently confused due to her baseline dementia.  PT evaluated patient and is recommending a skilled nursing facility.  Her calcium level trended up again, PTH and vitamin D was noted to be normal,  Ca improved and then has trended up slightly -overall has remained stable, currently awaiting placement, does not have long term care insurance   Assessment & Plan:   Severe dehydration with hypernatremia: -Admitted with dehydration and multiple electrolyte abnormalities in the setting of dementia and Parkinson's disease -Improved, hydrated with IV fluids, electrolytes stabilized -Oral intake is improving, drinking Ensure daily -stable -Disposition remains challenging, Education officer, museum following  Acute metabolic encephalopathy:  -Likely related to hypernatremia, hypercalcemia and dehydration TSH normal, ammonia level is 20, RPR negative, B12 is normal, no infectious process noted as well -Mental status has improved, PT OT recommended rehab, this was declined by her insurance agency Dr. Doristine Bosworth did peer to peer review  with Dr. Amalia Hailey and the recommendation is that patient qualifies for custodial care Social worker working with placement, family has to apply for [employer]   Hypercalcemia likely worsened by dehydration -Improved, PTH normal, vitamin D level 34. -SPEP without monoclonal protein -PTH rp pending -Ca trending up slowly, will dose Zometa x1  Anorexia:  Severe protein calorie malnutrition  -continue Ensure  Hypertension: Continue with Coreg, ibersartan.  Lasix on hold  Parkinson dementia; Continue with Sinemet.  Oral thrush; nystatin, improved.   Conjunctivitis; on Cipro eyedrops  DVT prophylaxis:  Lovenox Code Status: full code Family Communication: No family at bedside Disposition Plan:  Status is: Inpatient  Remains inpatient appropriate because: Remains medically stable for discharge, awaiting safe discharge plans:Unsafe d/c plan, needs long-term placement   Dispo:  Patient From: Home  Planned Disposition: [place]  Expected discharge date: To be determined  Medically stable for discharge: Yes   Consultants:   none  Procedures:   none  Antimicrobials:    Subjective: -No events overnight, denies any complaints, eating a little better overall  Objective: Vitals:   04/29/20 1759 04/29/20 2046 04/30/20 0440 04/30/20 0923  BP: (!) 144/87 124/78 (!) 149/93 117/80  Pulse: 64 64 (!) 59 61  Resp: 16 18 17 18   Temp: 99.5 F (37.5 C) 99.2 F (37.3 C) 97.8 F (36.6 C) 98.5 F (36.9 C)  TempSrc: Oral Oral  Oral  SpO2: 100% 98% 100% 98%  Weight:   53.1 kg   Height:        Intake/Output Summary (Last 24 hours) at 04/30/2020 1123 Last data filed at 04/30/2020 0845 Gross per 24 hour  Intake 920 ml  Output 850 ml  Net 70 ml   Filed Weights   04/23/20 2116 04/25/20 2051 04/30/20 0440  Weight:  53.2 kg 52.1 kg 53.1 kg    Examination:  General exam: Elderly chronically ill female sitting up in bed, AAO x2, no distress HEENT: No JVD CVS: S1-S2,  regular rate rhythm Lungs: Clear anteriorly Abdomen: Soft, nontender, bowel sounds present Extremities: No edema Neuro: Moves all extremities, no localizing signs   Data Reviewed: I have personally reviewed following labs and imaging studies  CBC: Recent Labs  Lab 04/28/20 0611  WBC 3.8*  HGB 11.5*  HCT 37.2  MCV 83.4  PLT 213   Basic Metabolic Panel: Recent Labs  Lab 04/26/20 0711 04/27/20 0347 04/28/20 0611 04/29/20 0351 04/30/20 0442  NA 134* 136 138 137 136  K 3.7 3.8 4.0 4.2 4.3  CL 107 109 110 107 105  CO2 18* 20* 19* 21* 21*  GLUCOSE 101* 85 94 85 86  BUN 13 17 14 16  25*  CREATININE 0.74 0.76 0.69 0.90 0.90  CALCIUM 10.3 10.4* 10.5* 10.8* 10.9*   GFR: Estimated Creatinine Clearance: 44.2 mL/min (by C-G formula based on SCr of 0.9 mg/dL). Liver Function Tests: Recent Labs  Lab 04/29/20 0351 04/30/20 0442  AST 25 24  ALT 11 14  ALKPHOS 71 74  BILITOT 0.6 0.3  PROT 7.1 6.8  ALBUMIN 3.0* 2.9*   No results for input(s): LIPASE, AMYLASE in the last 168 hours. No results for input(s): AMMONIA in the last 168 hours. Coagulation Profile: No results for input(s): INR, PROTIME in the last 168 hours. Cardiac Enzymes: No results for input(s): CKTOTAL, CKMB, CKMBINDEX, TROPONINI in the last 168 hours. BNP (last 3 results) No results for input(s): PROBNP in the last 8760 hours. HbA1C: No results for input(s): HGBA1C in the last 72 hours. CBG: Recent Labs  Lab 04/26/20 2048  GLUCAP 107*   Lipid Profile: No results for input(s): CHOL, HDL, LDLCALC, TRIG, CHOLHDL, LDLDIRECT in the last 72 hours. Thyroid Function Tests: No results for input(s): TSH, T4TOTAL, FREET4, T3FREE, THYROIDAB in the last 72 hours. Anemia Panel: No results for input(s): VITAMINB12, FOLATE, FERRITIN, TIBC, IRON, RETICCTPCT in the last 72 hours. Sepsis Labs: No results for input(s): PROCALCITON, LATICACIDVEN in the last 168 hours.  No results found for this or any previous visit  (from the past 240 hour(s)).       Radiology Studies: No results found.      Scheduled Meds: . aspirin  325 mg Oral Daily  . carbidopa-levodopa  1 tablet Oral TID  . carvedilol  12.5 mg Oral BID WC  . chlorhexidine  15 mL Mouth Rinse BID  . ciprofloxacin  1 drop Right Eye Q4H while awake  . divalproex  125 mg Oral Daily  . donepezil  10 mg Oral Daily  . enoxaparin (LOVENOX) injection  40 mg Subcutaneous Q24H  . feeding supplement (ENSURE ENLIVE)  237 mL Oral QID  . hydroxypropyl methylcellulose / hypromellose  1 drop Both Eyes TID  . irbesartan  75 mg Oral Daily  . labetalol  20 mg Intravenous Once  . LORazepam  0.5 mg Oral QPM  . mouth rinse  15 mL Mouth Rinse q12n4p  . megestrol  400 mg Oral Daily  . multivitamin with minerals  1 tablet Oral Daily  . nystatin  5 mL Oral QID  . polyethylene glycol  17 g Oral Daily  . rosuvastatin  10 mg Oral Daily  . senna-docusate  1 tablet Oral BID  . sertraline  100 mg Oral QHS  . sodium chloride flush  3 mL Intravenous Once  Continuous Infusions:    LOS: 17 days    Time spent: 25 minutes.   Domenic Polite, MD [facility]   04/30/2020, 11:23 AM

## 2020-05-01 DIAGNOSIS — E860 Dehydration: Secondary | ICD-10-CM | POA: Diagnosis not present

## 2020-05-01 DIAGNOSIS — E870 Hyperosmolality and hypernatremia: Secondary | ICD-10-CM | POA: Diagnosis not present

## 2020-05-01 LAB — COMPREHENSIVE METABOLIC PANEL
ALT: 31 U/L (ref 0–44)
AST: 31 U/L (ref 15–41)
Albumin: 3.4 g/dL — ABNORMAL LOW (ref 3.5–5.0)
Alkaline Phosphatase: 84 U/L (ref 38–126)
Anion gap: 13 (ref 5–15)
BUN: 23 mg/dL (ref 8–23)
CO2: 20 mmol/L — ABNORMAL LOW (ref 22–32)
Calcium: 11.5 mg/dL — ABNORMAL HIGH (ref 8.9–10.3)
Chloride: 103 mmol/L (ref 98–111)
Creatinine, Ser: 0.98 mg/dL (ref 0.44–1.00)
GFR calc Af Amer: >60 mL/min (ref >60)
GFR calc non Af Amer: >60 mL/min (ref >60)
Glucose, Bld: 102 mg/dL — ABNORMAL HIGH (ref 70–99)
Potassium: 4.7 mmol/L (ref 3.5–5.1)
Sodium: 136 mmol/L (ref 135–145)
Total Bilirubin: 0.7 mg/dL (ref 0.3–1.2)
Total Protein: 8.4 g/dL — ABNORMAL HIGH (ref 6.5–8.1)

## 2020-05-01 LAB — GLUCOSE, CAPILLARY: Glucose-Capillary: 91 mg/dL (ref 70–99)

## 2020-05-01 MED ORDER — SODIUM CHLORIDE 0.9 % IV SOLN: INTRAVENOUS | Status: AC

## 2020-05-01 MED ORDER — FUROSEMIDE 10 MG/ML IJ SOLN
20.0000 mg | Freq: Two times a day (BID) | INTRAMUSCULAR | Status: AC
  Administered 2020-05-01 (×2): 20 mg via INTRAVENOUS
  Filled 2020-05-01 (×2): qty 2

## 2020-05-01 MED ORDER — CALCITONIN (SALMON) 200 UNIT/ML IJ SOLN
200.0000 [IU] | Freq: Two times a day (BID) | INTRAMUSCULAR | Status: AC
  Administered 2020-05-01 (×2): 200 [IU] via SUBCUTANEOUS
  Filled 2020-05-01 (×2): qty 1

## 2020-05-01 NOTE — Progress Notes (Signed)
Pt is s/p one dose of Zometa for hypercalcemia. It may take a few days to see the effect. Corrected Ca 12 today. Add 2 dose of calcitonin per Dr. Broadus John. Pt meets criteria.  Onnie Boer, PharmD, BCIDP, AAHIVP, CPP Infectious Disease Pharmacist 05/01/2020 9:05 AM

## 2020-05-01 NOTE — Progress Notes (Signed)
PROGRESS NOTE    [name]  TAV:[id number] DOB: 1956/03/05 DOA: 04/13/2020 PCP: Lujean Amel, MD   Brief Narrative: 64 year old with past medical history significant for dementia, Parkinson disease, hypertension, hypercalcemia, chronic diastolic heart failure presented with worsening confusion, poor oral intake for 3 days prior to admission.  Patient stopped taking her medication 3 days prior to admission as well.  On arrival to the ED patient was severely dehydrated sodium 154, chloride 117, calcium 11.2.  CT head showed chronic cortical right parietal lobe infarct, no acute findings. Patient was admitted with a diagnosis of hypovolemic hyponatremia and hypercalcemia and dehydration and metabolic encephalopathy.  Patient was a started on IV fluids, her electrolytes abnormality has resolved improved.  Patient became more alert and back to baseline however she remains intermittently confused due to her baseline dementia.  PT evaluated patient and is recommending a skilled nursing facility.  Her calcium level trended up again, PTH and vitamin D was noted to be normal,  SPEP normal, Ca improved and then has trended up slightly -overall has remained stable, currently awaiting placement, does not have long term care insurance   Assessment & Plan:   Severe dehydration with hypernatremia: -Admitted with dehydration and multiple electrolyte abnormalities in the setting of dementia and Parkinson's disease -Improved, hydrated with IV fluids, electrolytes stabilized -Oral intake is improving, drinking Ensure daily -stable -Disposition remains challenging, Education officer, museum following  Acute metabolic encephalopathy:  -Likely related to hypernatremia, hypercalcemia and dehydration TSH normal, ammonia level is 20, RPR negative, B12 is normal, no infectious process noted as well -Mental status has improved, PT OT recommended rehab, this was declined by her insurance agency Dr. Doristine Bosworth did peer to peer  review with Dr. Amalia Hailey and the recommendation is that patient qualifies for custodial care Social worker working with placement, family has to apply for [employer]   Hypercalcemia likely worsened by dehydration -Improved, PTH normal, vitamin D level 34.,  -SPEP without monoclonal protein -PTH rp pending -Ca trending up slowly, corrected calcium is 12, given Zometa x1 on 8/6 -Slightly more lethargic today, start IV fluids and Lasix x2 doses and calcitonin -Monitor calcium  Anorexia:  Severe protein calorie malnutrition  -continue Ensure  Hypertension: Continue with Coreg, ibersartan.  Lasix today for calcium  Parkinson dementia; Continue with Sinemet.  Oral thrush; nystatin, improved.   Conjunctivitis; on Cipro eyedrops  DVT prophylaxis:  Lovenox Code Status: full code Family Communication: No family at bedside Disposition Plan:  Status is: Inpatient  Remains inpatient appropriate because: Awaiting long-term placement, calcium level has started trending up again   Dispo:  Patient From:  Home  Planned Disposition:  Long-term care SNF  Expected discharge date: To be determined  Medically stable for discharge:     Consultants:   none  Procedures:   none  Antimicrobials:    Subjective: -More tired and sleepy, eating less per staff  Objective: Vitals:   04/30/20 2024 05/01/20 0416 05/01/20 0859 05/01/20 0900  BP: (!) 176/98 139/86  132/85  Pulse: 63 81  75  Resp: 17 20  18   Temp: 97.8 F (36.6 C) 98.8 F (37.1 C)  98 F (36.7 C)  TempSrc:  Oral  Oral  SpO2: 100% 100%  96%  Weight:   49.9 kg   Height:   5' (1.524 m)     Intake/Output Summary (Last 24 hours) at 05/01/2020 1016 Last data filed at 05/01/2020 0900 Gross per 24 hour  Intake 300 ml  Output 750 ml  Net -  450 ml   Filed Weights   04/25/20 2051 04/30/20 0440 05/01/20 0859  Weight: 52.1 kg 53.1 kg 49.9 kg    Examination:  General exam: Elderly chronically ill female sitting up in bed,  somnolent but easily arousable, oriented to self and partly to place, no distress HEENT: No JVD CVS: S1-S2, regular rate rhythm Lungs: Decreased breath sounds the bases Abdomen: Soft, nontender, bowel sounds present Extremities: No edema Neuro: Moves all extremities, no localizing signs   Data Reviewed: I have personally reviewed following labs and imaging studies  CBC: Recent Labs  Lab 04/28/20 0611  WBC 3.8*  HGB 11.5*  HCT 37.2  MCV 83.4  PLT 767   Basic Metabolic Panel: Recent Labs  Lab 04/27/20 0347 04/28/20 0611 04/29/20 0351 04/30/20 0442 05/01/20 0354  NA 136 138 137 136 136  K 3.8 4.0 4.2 4.3 4.7  CL 109 110 107 105 103  CO2 20* 19* 21* 21* 20*  GLUCOSE 85 94 85 86 102*  BUN 17 14 16  25* 23  CREATININE 0.76 0.69 0.90 0.90 0.98  CALCIUM 10.4* 10.5* 10.8* 10.9* 11.5*   GFR: Estimated Creatinine Clearance: 40.6 mL/min (by C-G formula based on SCr of 0.98 mg/dL). Liver Function Tests: Recent Labs  Lab 04/29/20 0351 04/30/20 0442 05/01/20 0354  AST 25 24 31   ALT 11 14 31   ALKPHOS 71 74 84  BILITOT 0.6 0.3 0.7  PROT 7.1 6.8 8.4*  ALBUMIN 3.0* 2.9* 3.4*   No results for input(s): LIPASE, AMYLASE in the last 168 hours. No results for input(s): AMMONIA in the last 168 hours. Coagulation Profile: No results for input(s): INR, PROTIME in the last 168 hours. Cardiac Enzymes: No results for input(s): CKTOTAL, CKMB, CKMBINDEX, TROPONINI in the last 168 hours. BNP (last 3 results) No results for input(s): PROBNP in the last 8760 hours. HbA1C: No results for input(s): HGBA1C in the last 72 hours. CBG: Recent Labs  Lab 04/26/20 2048  GLUCAP 107*   Lipid Profile: No results for input(s): CHOL, HDL, LDLCALC, TRIG, CHOLHDL, LDLDIRECT in the last 72 hours. Thyroid Function Tests: No results for input(s): TSH, T4TOTAL, FREET4, T3FREE, THYROIDAB in the last 72 hours. Anemia Panel: No results for input(s): VITAMINB12, FOLATE, FERRITIN, TIBC, IRON, RETICCTPCT  in the last 72 hours. Sepsis Labs: No results for input(s): PROCALCITON, LATICACIDVEN in the last 168 hours.  No results found for this or any previous visit (from the past 240 hour(s)).       Radiology Studies: No results found.      Scheduled Meds: . aspirin  325 mg Oral Daily  . calcitonin  200 Units Subcutaneous BID  . carbidopa-levodopa  1 tablet Oral TID  . carvedilol  12.5 mg Oral BID WC  . chlorhexidine  15 mL Mouth Rinse BID  . ciprofloxacin  1 drop Right Eye Q4H while awake  . divalproex  125 mg Oral Daily  . donepezil  10 mg Oral Daily  . enoxaparin (LOVENOX) injection  40 mg Subcutaneous Q24H  . feeding supplement (ENSURE ENLIVE)  237 mL Oral QID  . furosemide  20 mg Intravenous BID  . hydroxypropyl methylcellulose / hypromellose  1 drop Both Eyes TID  . irbesartan  75 mg Oral Daily  . labetalol  20 mg Intravenous Once  . LORazepam  0.5 mg Oral QPM  . mouth rinse  15 mL Mouth Rinse q12n4p  . megestrol  400 mg Oral Daily  . multivitamin with minerals  1 tablet Oral Daily  .  nystatin  5 mL Oral QID  . polyethylene glycol  17 g Oral Daily  . rosuvastatin  10 mg Oral Daily  . senna-docusate  1 tablet Oral BID  . sertraline  100 mg Oral QHS  . sodium chloride flush  3 mL Intravenous Once   Continuous Infusions: . sodium chloride 75 mL/hr at 05/01/20 0942     LOS: 18 days    Time spent: 25 minutes.   Domenic Polite, MD [facility]   05/01/2020, 10:16 AM

## 2020-05-01 NOTE — Plan of Care (Signed)
  Problem: Education: Goal: Knowledge of General Education information will improve Description Including pain rating scale, medication(s)/side effects and non-pharmacologic comfort measures Outcome: Progressing   

## 2020-05-02 ENCOUNTER — Encounter (HOSPITAL_COMMUNITY): Payer: Self-pay | Admitting: Internal Medicine

## 2020-05-02 ENCOUNTER — Inpatient Hospital Stay (HOSPITAL_COMMUNITY): Payer: HMO

## 2020-05-02 DIAGNOSIS — E860 Dehydration: Secondary | ICD-10-CM | POA: Diagnosis not present

## 2020-05-02 DIAGNOSIS — R627 Adult failure to thrive: Secondary | ICD-10-CM | POA: Diagnosis not present

## 2020-05-02 DIAGNOSIS — E870 Hyperosmolality and hypernatremia: Secondary | ICD-10-CM | POA: Diagnosis not present

## 2020-05-02 LAB — URINALYSIS, ROUTINE W REFLEX MICROSCOPIC
Bilirubin Urine: NEGATIVE
Glucose, UA: NEGATIVE mg/dL
Hgb urine dipstick: NEGATIVE
Ketones, ur: NEGATIVE mg/dL
Leukocytes,Ua: NEGATIVE
Nitrite: NEGATIVE
Protein, ur: NEGATIVE mg/dL
Specific Gravity, Urine: 1.015 (ref 1.005–1.030)
pH: 8 (ref 5.0–8.0)

## 2020-05-02 LAB — COMPREHENSIVE METABOLIC PANEL
ALT: 17 U/L (ref 0–44)
AST: 27 U/L (ref 15–41)
Albumin: 3 g/dL — ABNORMAL LOW (ref 3.5–5.0)
Alkaline Phosphatase: 70 U/L (ref 38–126)
Anion gap: 13 (ref 5–15)
BUN: 27 mg/dL — ABNORMAL HIGH (ref 8–23)
CO2: 16 mmol/L — ABNORMAL LOW (ref 22–32)
Calcium: 9.6 mg/dL (ref 8.9–10.3)
Chloride: 106 mmol/L (ref 98–111)
Creatinine, Ser: 0.94 mg/dL (ref 0.44–1.00)
GFR calc Af Amer: >60 mL/min (ref >60)
GFR calc non Af Amer: >60 mL/min (ref >60)
Glucose, Bld: 103 mg/dL — ABNORMAL HIGH (ref 70–99)
Potassium: 3.8 mmol/L (ref 3.5–5.1)
Sodium: 135 mmol/L (ref 135–145)
Total Bilirubin: 0.4 mg/dL (ref 0.3–1.2)
Total Protein: 7.2 g/dL (ref 6.5–8.1)

## 2020-05-02 LAB — BLOOD GAS, ARTERIAL
Acid-base deficit: 5.6 mmol/L — ABNORMAL HIGH (ref 0.0–2.0)
Acid-base deficit: 6 mmol/L — ABNORMAL HIGH (ref 0.0–2.0)
Bicarbonate: 17.4 mmol/L — ABNORMAL LOW (ref 20.0–28.0)
Bicarbonate: 17.9 mmol/L — ABNORMAL LOW (ref 20.0–28.0)
Drawn by: 548791
Drawn by: 36527
FIO2: 21
FIO2: 36
O2 Saturation: 92.5 %
O2 Saturation: 99 %
Patient temperature: 37
Patient temperature: 36.4
pCO2 arterial: 24.2 mmHg — ABNORMAL LOW (ref 32.0–48.0)
pCO2 arterial: 28.4 mmHg — ABNORMAL LOW (ref 32.0–48.0)
pH, Arterial: 7.47 — ABNORMAL HIGH (ref 7.350–7.450)
pH, Arterial: 7.412 (ref 7.350–7.450)
pO2, Arterial: 64.7 mmHg — ABNORMAL LOW (ref 83.0–108.0)
pO2, Arterial: 144 mmHg — ABNORMAL HIGH (ref 83.0–108.0)

## 2020-05-02 LAB — CBC
HCT: 35.6 % — ABNORMAL LOW (ref 36.0–46.0)
Hemoglobin: 11.3 g/dL — ABNORMAL LOW (ref 12.0–15.0)
MCH: 26.3 pg (ref 26.0–34.0)
MCHC: 31.7 g/dL (ref 30.0–36.0)
MCV: 83 fL (ref 80.0–100.0)
Platelets: 170 10*3/uL (ref 150–400)
RBC: 4.29 MIL/uL (ref 3.87–5.11)
RDW: 19.5 % — ABNORMAL HIGH (ref 11.5–15.5)
WBC: 2.6 10*3/uL — ABNORMAL LOW (ref 4.0–10.5)
nRBC: 0 % (ref 0.0–0.2)

## 2020-05-02 LAB — TROPONIN I (HIGH SENSITIVITY): Troponin I (High Sensitivity): 14 ng/L (ref <18)

## 2020-05-02 LAB — D-DIMER, QUANTITATIVE: D-Dimer, Quant: 2.41 ug/mL-FEU — ABNORMAL HIGH (ref 0.00–0.50)

## 2020-05-02 LAB — AMMONIA: Ammonia: 27 umol/L (ref 9–35)

## 2020-05-02 MED ORDER — SODIUM BICARBONATE 8.4 % IV SOLN
50.0000 meq | Freq: Once | INTRAVENOUS | Status: AC
  Administered 2020-05-02: 50 meq via INTRAVENOUS
  Filled 2020-05-02: qty 50

## 2020-05-02 MED ORDER — IOHEXOL 350 MG/ML SOLN
80.0000 mL | Freq: Once | INTRAVENOUS | Status: AC | PRN
  Administered 2020-05-02: 80 mL via INTRAVENOUS

## 2020-05-02 NOTE — Progress Notes (Addendum)
PROGRESS NOTE    [name]  DUK:[id number] DOB: 08/14/1956 DOA: 04/13/2020 PCP: Lujean Amel, MD   Brief Narrative: 64 year old with past medical history significant for dementia, Parkinson disease, hypertension, hypercalcemia, chronic diastolic heart failure presented with worsening confusion, poor oral intake for 3 days prior to admission.  Patient stopped taking her medication 3 days prior to admission as well.  On arrival to the ED patient was severely dehydrated sodium 154, chloride 117, calcium 11.2.  CT head showed chronic cortical right parietal lobe infarct, no acute findings. Patient was admitted with a diagnosis of hypovolemic hyponatremia and hypercalcemia and dehydration and metabolic encephalopathy.  Patient was a started on IV fluids, her electrolytes abnormality has resolved improved.  Patient became more alert and back to baseline however she remains intermittently confused due to her baseline dementia.  PT evaluated patient and is recommending a skilled nursing facility.  Her calcium level trended up again, PTH and vitamin D was noted to be normal,  SPEP normal, Ca improved and then has trended up slightly -overall has remained stable, currently awaiting placement, does not have long term care insurance   Assessment & Plan:   Severe dehydration with hypernatremia: -Admitted with dehydration and multiple electrolyte abnormalities in the setting of dementia and Parkinson's disease -Improved, hydrated with IV fluids, electrolytes stabilized -Oral intake is improving, drinking Ensure daily -stable -Disposition remains challenging, Education officer, museum following  Acute metabolic encephalopathy:  -Likely related to hypernatremia, hypercalcemia and dehydration TSH normal, ammonia level is 20, RPR negative, B12 is normal, no infectious process noted as well -Mental status has improved, PT OT recommended rehab, this was declined by her insurance agency Dr. Doristine Bosworth did peer to peer  review with Dr. Amalia Hailey and the recommendation is that patient qualifies for custodial care Social worker working with placement, family has to apply for medicaid.  -Last night was reported to be more lethargic, seems close to baseline to me today, suspect hypercalcemia was contributing which is finally improving -Follow-up urinalysis, bladder scan rule out retention  Hypercalcemia likely worsened by dehydration - PTH normal, vitamin D level 34.,  -SPEP without monoclonal protein -PTH rp still pending -Calcium level had improved with hydration, subsequently trended up again with corrected calcium of 12 on 8/7 , she was given a dose of Zometa on 8/6 and IV fluids as well as calcitonin 8/7  -Calcium improving  -Monitor   Anorexia:  Severe protein calorie malnutrition  -continue Ensure  Hypertension: Continue with Coreg, ibersartan.  Lasix yesterday for calcium  Parkinsons disease  dementia; Continue with Sinemet.  Thoracic aortic aneurysm -Incidentally noted on CT, consider follow-up  Oral thrush; nystatin, improved.   Conjunctivitis; on Cipro eyedrops  DVT prophylaxis:  Lovenox Code Status: full code Family Communication: No family at bedside Disposition Plan:  Status is: Inpatient  Remains inpatient appropriate because: Awaiting long-term placement, calcium level has started trending up again   Dispo:  Patient From:  Home  Planned Disposition:  Long-term care SNF  Expected discharge date: To be determined  Medically stable for discharge:     Consultants:   none  Procedures:   none  Antimicrobials:    Subjective: -More tired and sleepy, eating less per staff  Objective: Vitals:   05/02/20 0503 05/02/20 0514 05/02/20 0845 05/02/20 0956  BP: (!) 160/88  110/83   Pulse: 68  67 65  Resp: 15  16   Temp: 97.6 F (36.4 C)  99.5 F (37.5 C)   TempSrc:   Oral  SpO2: 100%  100%   Weight:  50.9 kg    Height:        Intake/Output Summary (Last 24 hours) at  05/02/2020 1131 Last data filed at 05/02/2020 1000 Gross per 24 hour  Intake 2070.81 ml  Output 1100 ml  Net 970.81 ml   Filed Weights   04/30/20 0440 05/01/20 0859 05/02/20 0514  Weight: 53.1 kg 49.9 kg 50.9 kg    Examination:  General exam: Elderly chronically ill female sitting up in bed, somnolent but easily arousable, oriented to self and partly to place, no distress HEENT: No JVD CVS: S1-S2, regular rate rhythm Lungs: Decreased breath sounds the bases Abdomen: Soft, nontender, bowel sounds present Extremities: No edema Neuro: Moves all extremities, no localizing signs   Data Reviewed: I have personally reviewed following labs and imaging studies  CBC: Recent Labs  Lab 04/28/20 0611 05/02/20 0248  WBC 3.8* 2.6*  HGB 11.5* 11.3*  HCT 37.2 35.6*  MCV 83.4 83.0  PLT 187 527   Basic Metabolic Panel: Recent Labs  Lab 04/28/20 0611 04/29/20 0351 04/30/20 0442 05/01/20 0354 05/02/20 0248  NA 138 137 136 136 135  K 4.0 4.2 4.3 4.7 3.8  CL 110 107 105 103 106  CO2 19* 21* 21* 20* 16*  GLUCOSE 94 85 86 102* 103*  BUN 14 16 25* 23 27*  CREATININE 0.69 0.90 0.90 0.98 0.94  CALCIUM 10.5* 10.8* 10.9* 11.5* 9.6   GFR: Estimated Creatinine Clearance: 42.3 mL/min (by C-G formula based on SCr of 0.94 mg/dL). Liver Function Tests: Recent Labs  Lab 04/29/20 0351 04/30/20 0442 05/01/20 0354 05/02/20 0248  AST 25 24 31 27   ALT 11 14 31 17   ALKPHOS 71 74 84 70  BILITOT 0.6 0.3 0.7 0.4  PROT 7.1 6.8 8.4* 7.2  ALBUMIN 3.0* 2.9* 3.4* 3.0*   No results for input(s): LIPASE, AMYLASE in the last 168 hours. Recent Labs  Lab 05/02/20 0248  AMMONIA 27   Coagulation Profile: No results for input(s): INR, PROTIME in the last 168 hours. Cardiac Enzymes: No results for input(s): CKTOTAL, CKMB, CKMBINDEX, TROPONINI in the last 168 hours. BNP (last 3 results) No results for input(s): PROBNP in the last 8760 hours. HbA1C: No results for input(s): HGBA1C in the last 72  hours. CBG: Recent Labs  Lab 04/26/20 2048 05/01/20 2349  GLUCAP 107* 91   Lipid Profile: No results for input(s): CHOL, HDL, LDLCALC, TRIG, CHOLHDL, LDLDIRECT in the last 72 hours. Thyroid Function Tests: No results for input(s): TSH, T4TOTAL, FREET4, T3FREE, THYROIDAB in the last 72 hours. Anemia Panel: No results for input(s): VITAMINB12, FOLATE, FERRITIN, TIBC, IRON, RETICCTPCT in the last 72 hours. Sepsis Labs: No results for input(s): PROCALCITON, LATICACIDVEN in the last 168 hours.  No results found for this or any previous visit (from the past 240 hour(s)).       Radiology Studies: CT HEAD WO CONTRAST  Result Date: 05/02/2020 CLINICAL DATA:  Acute neurologic deficit EXAM: CT HEAD WITHOUT CONTRAST TECHNIQUE: Contiguous axial images were obtained from the base of the skull through the vertex without intravenous contrast. COMPARISON:  04/13/2020 FINDINGS: Brain: There is no mass, hemorrhage or extra-axial collection. There is generalized atrophy without lobar predilection. Hypodensity of the white matter is most commonly associated with chronic microvascular disease. Vascular: Atherosclerotic calcification of the vertebral and internal carotid arteries at the skull base. No abnormal hyperdensity of the major intracranial arteries or dural venous sinuses. Skull: The visualized skull base, calvarium and extracranial  soft tissues are normal. Sinuses/Orbits: No fluid levels or advanced mucosal thickening of the visualized paranasal sinuses. No mastoid or middle ear effusion. The orbits are normal. IMPRESSION: Generalized atrophy and chronic microvascular ischemia without acute intracranial abnormality. Electronically Signed   By: Ulyses Jarred M.D.   On: 05/02/2020 03:13   CT ANGIO CHEST PE W OR WO CONTRAST  Result Date: 05/02/2020 CLINICAL DATA:  Elevated D-dimer, episodes of unresponsiveness EXAM: CT ANGIOGRAPHY CHEST WITH CONTRAST TECHNIQUE: Multidetector CT imaging of the chest was  performed using the standard protocol during bolus administration of intravenous contrast. Multiplanar CT image reconstructions and MIPs were obtained to evaluate the vascular anatomy. CONTRAST:  64mL OMNIPAQUE IOHEXOL 350 MG/ML SOLN COMPARISON:  Chest radiograph dated 05/02/2020. FINDINGS: Cardiovascular: Satisfactory opacification of the bilateral pulmonary arteries to the subsegmental level. No evidence of pulmonary embolism. Prosthetic aortic valve. 4.2 cm ascending thoracic aortic aneurysm (coronal image 30). Atherosclerotic calcifications of the aortic arch. The heart is top-normal in size. No pericardial effusion. Left subclavian ICD. Three vessel coronary atherosclerosis. Mediastinum/Nodes: No suspicious mediastinal lymphadenopathy. Visualized thyroid is unremarkable. Lungs/Pleura: No suspicious pulmonary nodules. No focal consolidation. Minimal dependent atelectasis in the bilateral lower lobes, left greater than right. Mild centrilobular emphysematous changes, upper lung predominant. No pleural effusion or pneumothorax. Upper Abdomen: Visualized upper abdomen is grossly unremarkable, noting vascular calcifications. Musculoskeletal: Exaggerated thoracic kyphosis with mild degenerative changes of the mid thoracic spine. Median sternotomy. Review of the MIP images confirms the above findings. IMPRESSION: No evidence of pulmonary embolism. Prosthetic aortic valve with 4.2 cm ascending thoracic aortic aneurysm. Recommend annual imaging followup by CTA or MRA. This recommendation follows 2010 ACCF/AHA/AATS/ACR/ASA/SCA/SCAI/SIR/STS/SVM Guidelines for the Diagnosis and Management of Patients with Thoracic Aortic Disease. Circulation. 2010; 121: F027-X412. Aortic aneurysm NOS (ICD10-I71.9) Aortic Atherosclerosis (ICD10-I70.0) and Emphysema (ICD10-J43.9). Electronically Signed   By: Julian Hy M.D.   On: 05/02/2020 07:57   DG CHEST PORT 1 VIEW  Result Date: 05/02/2020 CLINICAL DATA:  Lethargy EXAM:  PORTABLE CHEST 1 VIEW COMPARISON:  None. FINDINGS: The heart size and mediastinal contours are within normal limits. Both lungs are clear. The visualized skeletal structures are unremarkable. Unchanged position of left chest wall AICD leads. Remote median sternotomy and aortic valve replacement. IMPRESSION: No active disease. Electronically Signed   By: Ulyses Jarred M.D.   On: 05/02/2020 02:16        Scheduled Meds: . aspirin  325 mg Oral Daily  . carbidopa-levodopa  1 tablet Oral TID  . carvedilol  12.5 mg Oral BID WC  . chlorhexidine  15 mL Mouth Rinse BID  . ciprofloxacin  1 drop Right Eye Q4H while awake  . divalproex  125 mg Oral Daily  . donepezil  10 mg Oral Daily  . enoxaparin (LOVENOX) injection  40 mg Subcutaneous Q24H  . feeding supplement (ENSURE ENLIVE)  237 mL Oral QID  . hydroxypropyl methylcellulose / hypromellose  1 drop Both Eyes TID  . irbesartan  75 mg Oral Daily  . labetalol  20 mg Intravenous Once  . mouth rinse  15 mL Mouth Rinse q12n4p  . megestrol  400 mg Oral Daily  . multivitamin with minerals  1 tablet Oral Daily  . nystatin  5 mL Oral QID  . polyethylene glycol  17 g Oral Daily  . rosuvastatin  10 mg Oral Daily  . senna-docusate  1 tablet Oral BID  . sertraline  100 mg Oral QHS  . sodium chloride flush  3 mL Intravenous Once  Continuous Infusions:    LOS: 19 days    Time spent: 25 minutes.   Domenic Polite, MD [facility]   05/02/2020, 11:31 AM

## 2020-05-02 NOTE — Significant Event (Addendum)
Rapid Response Event Note   Reason for Call :  Called d/t unresponsiveness.   Pt here for confusion/poor po intake/dehydration. Per charting, she is normally alert and oriented x2, follows commands, and moves all extremities.   Initial Focused Assessment:  Pt laying in bed with eyes closed. Pt will grimace and move all extremities equally to painful stimulation only.  When not stimulated, pt has blank stare on her face and will not speak, move, or follow commands. Pupils 4 and sluggish. Skin warm and dry.  T-99.1, HR-73, BP-130/80, RR-15, SpO2-99% on RA, CBG-91.   Pt slowly began to respond after a few minutes of continuous painful stimulation. She has progressed back to baseline per bedside RN. She will move all extremities purposely, follow commands, and answer some questions.      Interventions:  CBG  Plan of Care:  Continue to monitor pt. Await PCP return page and initiate any orders that are placed. Call RRT if further assistance needed.    Event Summary:   MD Notified: Dr. Marlowe Sax paged by bedside RN Call [place] Time:  Dillard Essex, RN

## 2020-05-02 NOTE — Progress Notes (Signed)
ABG drawn per MD order. Pt on 4l nasal cannula, no distress noted. Shaneeka in lab notified of sample being sent.

## 2020-05-02 NOTE — Progress Notes (Addendum)
Overnight event  Patient with history of dementia, Parkinson's disease, hypertension, hypercalcemia, chronic diastolic CHF admitted for poor oral intake, severe dehydration, hypernatremia, hypercalcemia, acute metabolic encephalopathy, anorexia, severe protein calorie malnutrition.  Per chart, she is somnolent but easily arousable, oriented to person and place and moves all extremities.    Nursing staff found the patient to be unresponsive even with sternal rubs.  Rapid response was called and patient became more alert.  Vital signs stable.  Temperature 99.1, heart rate 73, blood pressure 130/80, respiratory rate 15, SPO2 99% on room air.  CBG 91.  Patient was seen and examined at bedside.  At the time of my arrival, she had had a blank stare.  Appeared extremely lethargic.  Soon after, she closed her eyes and it was difficult to arouse her.  She was grimacing only to sternal rubs.  A few seconds later patient became more alert and was able to tell me her name.  She had no complaints.  Denied dizziness, chest pain, shortness of breath, or abdominal pain.  Heart: RRR Lungs: CTAB Abdomen: Soft, bowel sounds present, nontender to palpation Extremities: No edema Neuro: PERRLA.  Able to squeeze my fingers with her hands on command bilaterally.  Able to wiggle her toes-right lower extremity only.  No movement noted in the left lower extremity.  Plan -Stat head CT -Stat EKG -Stat labs: CBC, CMP, troponin, ABG, ammonia, UA -Stat chest x-ray -She has been receiving Ativan every evening although no dose administered today.  Ativan has been discontinued.  Update: -Head CT negative for acute intracranial abnormality -Ammonia level normal -CBC with WBC count 2.6, was 3.8 on previous labs -UA pending -ABG with pH 7.47, PCO2 24, and PO2 64.  No tachypnea or signs of respiratory distress.  Satting 99% on room air.  Chest x-ray without evidence of active disease.  She has been placed on 4 L supplemental  oxygen and repeat ABG ordered.  Check D-dimer level, if elevated CT angiogram to rule out PE. -EKG with paced rhythm.  Troponin pending.  Update: -High-sensitivity troponin normal -Bicarb low at 16, anion gap normal.  Bicarb supplementation ordered. -D-dimer elevated, CT angiogram ordered to rule out PE and currently pending.

## 2020-05-02 NOTE — Progress Notes (Signed)
Per Dr. Patrecia Pour, PO2 is 64.7 and wants Respiratory consulted and them to determine the amount of O2 to place patient on.  Mollie, Respiratory Therapist, notified.  She advised that I place patient on 3 to 4 LPM [place] O2.  Patient placed on 4 LPM [place].  No respiratory distress noted.  On RA, patient saturation rates are 97% to 99%.  Will continue to monitor.  Earleen Reaper RN

## 2020-05-02 NOTE — Progress Notes (Signed)
ABG drawn per MD order. Kim in lab notified of sample being sent down. RT will continue to monitor.

## 2020-05-03 DIAGNOSIS — E870 Hyperosmolality and hypernatremia: Secondary | ICD-10-CM | POA: Diagnosis not present

## 2020-05-03 DIAGNOSIS — E860 Dehydration: Secondary | ICD-10-CM | POA: Diagnosis not present

## 2020-05-03 DIAGNOSIS — R627 Adult failure to thrive: Secondary | ICD-10-CM | POA: Diagnosis not present

## 2020-05-03 LAB — CBC
HCT: 33.9 % — ABNORMAL LOW (ref 36.0–46.0)
Hemoglobin: 10.9 g/dL — ABNORMAL LOW (ref 12.0–15.0)
MCH: 26.9 pg (ref 26.0–34.0)
MCHC: 32.2 g/dL (ref 30.0–36.0)
MCV: 83.7 fL (ref 80.0–100.0)
Platelets: 186 10*3/uL (ref 150–400)
RBC: 4.05 MIL/uL (ref 3.87–5.11)
RDW: 19.7 % — ABNORMAL HIGH (ref 11.5–15.5)
WBC: 3.4 10*3/uL — ABNORMAL LOW (ref 4.0–10.5)
nRBC: 0 % (ref 0.0–0.2)

## 2020-05-03 LAB — COMPREHENSIVE METABOLIC PANEL
ALT: 10 U/L (ref 0–44)
AST: 24 U/L (ref 15–41)
Albumin: 2.9 g/dL — ABNORMAL LOW (ref 3.5–5.0)
Alkaline Phosphatase: 66 U/L (ref 38–126)
Anion gap: 7 (ref 5–15)
BUN: 28 mg/dL — ABNORMAL HIGH (ref 8–23)
CO2: 20 mmol/L — ABNORMAL LOW (ref 22–32)
Calcium: 9.5 mg/dL (ref 8.9–10.3)
Chloride: 109 mmol/L (ref 98–111)
Creatinine, Ser: 0.84 mg/dL (ref 0.44–1.00)
GFR calc Af Amer: >60 mL/min (ref >60)
GFR calc non Af Amer: >60 mL/min (ref >60)
Glucose, Bld: 92 mg/dL (ref 70–99)
Potassium: 3.9 mmol/L (ref 3.5–5.1)
Sodium: 136 mmol/L (ref 135–145)
Total Bilirubin: 0.3 mg/dL (ref 0.3–1.2)
Total Protein: 7 g/dL (ref 6.5–8.1)

## 2020-05-03 NOTE — TOC Progression Note (Signed)
Transition of Care [facility]) - Progression Note    Patient Details  Name: GLORIA RICARDO MRN: [id number] Date of Birth: Feb 14, 1956  [facility]) CM/SW Contact  Sharlet Salina Mila Homer, LCSW Phone Number: 05/03/2020, 1:58 PM  Clinical Narrative:  Talked with Toniann Ket, financial counselor [phone]) regarding patient. She will e-mail Adult Medicaid Supervisor Robin at [place] to determine if patient has an adult Medicaid application and about the [place]-2 needed per Adult Medicaid worker Cherene Altes.     Expected Discharge Plan: Skilled Nursing Facility Barriers to Discharge: Ship broker, Other (comment) (Initiating facility search)  Expected Discharge Plan and Services Expected Discharge Plan: [place] In-house Referral: Clinical Social Work     Living arrangements for the past 2 months: [place] ([facility])                                     Social Determinants of Health (SDOH) Interventions    Readmission Risk Interventions Readmission Risk Prevention Plan 01/02/2020 12/25/2019  Transportation Screening Complete Complete  PCP or Specialist Appt within 5-7 Days Complete Complete  Home Care Screening Complete Complete  Medication Review (RN CM) Complete Complete  Some recent data might be hidden

## 2020-05-03 NOTE — Progress Notes (Signed)
Physical Therapy Treatment Patient Details Name: [name] MRN: [id number] DOB: 09-12-1956 Today's Date: 05/03/2020    History of Present Illness [name] is a 64 y.o. female with medical history significant of advanced dementia, Parkinson disease, hypertension, hypercalcemia, chronic diastolic CHF, presented with worsening confusion and not eating or drinking x3 days.  Admitted with metabolic encephalopathy and hypernatremia.  Per TOC notes, pt unable to return to ALF due to level of assist, awaiting Medicaid for SNF placement.    PT Comments    Pt in bed upon arrival of PT, agreeable to session with focus on progressing functional strength for transfers. However, the pt required significantly more assist at this date, requiring maxA-totalA of 2 even with use of the stedy to complete sit-stands and transfer to the recliner. The pt was unable to hold herself up today, even with BUE support on the stedy, and required constant maxA to maintain upright stability. The pt will continue to benefit from skilled PT to maintain functional strength and mobility, as well as to reduce caregiver burden.    Follow Up Recommendations  SNF     Equipment Recommendations  Hospital bed;Wheelchair (measurements PT)    Recommendations for Other Services       Precautions / Restrictions Precautions Precautions: Fall Restrictions Weight Bearing Restrictions: No    Mobility  Bed Mobility Overal bed mobility: Needs Assistance Bed Mobility: Supine to Sit     Supine to sit: Total assist;+2 for physical assistance     General bed mobility comments: Total A x 2 for bed mobility with decreased/slow initiation of movements. Unable to advance B LE, but able to hold to bedrail once hand placed  Transfers Overall transfer level: Needs assistance Equipment used: Ambulation equipment used Transfers: Sit to/from Stand Sit to Stand: Total assist;+2 physical assistance Stand pivot transfers: Total  assist;+2 physical assistance       General transfer comment: Total A x 2 for sit to stand trials in [place], pt unable to hold self up. Pt transferred to recliner chair via Stedy with 2 person assist. Maximove pad in place under pt  Ambulation/Gait Ambulation/Gait assistance:  (pt unable)               Stairs             Wheelchair Mobility    Modified Rankin (Stroke Patients Only)       Balance Overall balance assessment: Needs assistance Sitting-balance support: Feet supported;Bilateral upper extremity supported;No upper extremity supported Sitting balance-Leahy Scale: Fair Sitting balance - Comments: Initially requiring assistance to maintain sitting balance but demonstrated ability to hold self up with B UE and L lateral lean noted Postural control: Posterior lean;Left lateral lean Standing balance support: Bilateral upper extremity supported Standing balance-Leahy Scale: Zero Standing balance comment: Total A x 2 to maintain standing balance in [place]. Unable to hold self up                            Cognition Arousal/Alertness: Awake/alert Behavior During Therapy: Flat affect (sad) Overall Cognitive Status: Impaired/Different from baseline Area of Impairment: Attention;Memory;Following commands;Safety/judgement;Awareness;Problem solving                 Orientation Level: Time;Situation Current Attention Level: Sustained Memory: Decreased short-term memory Following Commands: Follows one step commands inconsistently;Follows one step commands with increased time Safety/Judgement: Decreased awareness of safety;Decreased awareness of deficits Awareness: Intellectual Problem Solving: Slow processing;Decreased initiation;Difficulty sequencing;Requires verbal cues;Requires  tactile cues General Comments: Pt continues with slow processing and slow motor planning, but notably more quiet today and not answering as many questions as previous sessions.  The pt also became tearful at the end of the session, did not express cause      Exercises      General Comments General comments (skin integrity, edema, etc.): VSS, pt with increasing tearfulness through session      Pertinent Vitals/Pain Pain Assessment: No/denies pain Faces Pain Scale: No hurt           PT Goals (current goals can now be found in the care plan section) Acute Rehab PT Goals Patient Stated Goal: did not state PT Goal Formulation: Patient unable to participate in goal setting Time For Goal Achievement: 05/14/20 Potential to Achieve Goals: Fair Progress towards PT goals: Progressing toward goals    Frequency    Min 2X/week      PT Plan Current plan remains appropriate    Co-evaluation PT/OT/SLP Co-Evaluation/Treatment: Yes Reason for Co-Treatment: Complexity of the patient's impairments (multi-system involvement);For patient/therapist safety;To address functional/ADL transfers PT goals addressed during session: Mobility/safety with mobility;Balance;Proper use of DME;Strengthening/ROM OT goals addressed during session: ADL's and self-care;Other (comment) (ADL transfers)      AM-PAC PT "6 Clicks" Mobility   Outcome Measure  Help needed turning from your back to your side while in a flat bed without using bedrails?: A Lot Help needed moving from lying on your back to sitting on the side of a flat bed without using bedrails?: A Lot Help needed moving to and from a bed to a chair (including a wheelchair)?: Total Help needed standing up from a chair using your arms (e.g., wheelchair or bedside chair)?: A Lot Help needed to walk in hospital room?: Total Help needed climbing 3-5 steps with a railing? : Total 6 Click Score: 9    End of Session Equipment Utilized During Treatment: Gait belt Activity Tolerance: Patient tolerated treatment well Patient left: in chair;with call bell/phone within reach;with chair alarm set Nurse Communication: Mobility  status PT Visit Diagnosis: Unsteadiness on feet (R26.81);Other abnormalities of gait and mobility (R26.89);Muscle weakness (generalized) (M62.81);History of falling (Z91.81)     Time: 8882-8003 PT Time Calculation (min) (ACUTE ONLY): 29 min  Charges:  $Therapeutic Activity: 8-22 mins                     Karma Ganja, PT, DPT   Acute Rehabilitation Department Pager #: [phone]   Otho Bellows 05/03/2020, 4:15 PM

## 2020-05-03 NOTE — Progress Notes (Signed)
Occupational Therapy Treatment Patient Details Name: [name] MRN: [id number] DOB: [birth year] Today's Date: 05/03/2020    History of present illness [name] is a 64 y.o. female with medical history significant of advanced dementia, Parkinson disease, hypertension, hypercalcemia, chronic diastolic CHF, presented with worsening confusion and not eating or drinking x3 days.  Admitted with metabolic encephalopathy and hypernatremia.  Per TOC notes, pt unable to return to ALF due to level of assist, awaiting Medicaid for SNF placement.   OT comments  Pt with noted decline in functional abilities compared to previous OOB activities. Pt required Total A x 2 for bed mobility, sit to stand transfers in [place] and to maintain standing balance in [place]. Pt previously was able to hold self up in [place] for peri care, but unable to do so for Total A cleanup after incontinence. Pt more quiet with decreased responses during session, suspect more due to signs of depressed mood rather than cognition. Will continue to follow acutely, but have reduced frequency secondary to decreased progress with OT POC.    Follow Up Recommendations  SNF;Supervision/Assistance - 24 hour    Equipment Recommendations  Other (comment) (TBD)    Recommendations for Other Services      Precautions / Restrictions Precautions Precautions: Fall Restrictions Weight Bearing Restrictions: No       Mobility Bed Mobility Overal bed mobility: Needs Assistance Bed Mobility: Supine to Sit     Supine to sit: Total assist;+2 for physical assistance     General bed mobility comments: Total A x 2 for bed mobility with decreased/slow initiation of movements. Unable to advance B LE, but able to hold to bedrail once hand placed  Transfers Overall transfer level: Needs assistance Equipment used: Ambulation equipment used Transfers: Sit to/from Stand Sit to Stand: Total assist;+2 physical assistance Stand pivot transfers:  Total assist;+2 physical assistance       General transfer comment: Total A x 2 for sit to stand trials in [place], pt unable to hold self up. Pt transferred to recliner chair via [place] with 2 person assist. Maximove pad in place under pt    Balance Overall balance assessment: Needs assistance Sitting-balance support: Feet supported;Bilateral upper extremity supported;No upper extremity supported Sitting balance-Leahy Scale: Fair Sitting balance - Comments: Initially requiring assistance to maintain sitting balance but demonstrated ability to hold self up with B UE and L lateral lean noted Postural control: Posterior lean;Left lateral lean Standing balance support: Bilateral upper extremity supported Standing balance-Leahy Scale: Zero Standing balance comment: Total A x 2 to maintain standing balance in [place]. Unable to hold self up                           ADL either performed or assessed with clinical judgement   ADL Overall ADL's : Needs assistance/impaired Eating/Feeding: Set up;Sitting Eating/Feeding Details (indicate cue type and reason): Setup to drink from ensure bottle Grooming: Maximal assistance;Sitting Grooming Details (indicate cue type and reason): Max A to wipe mouth today                     Toileting- Clothing Manipulation and Hygiene: Total assistance;Sit to/from stand Toileting - Clothing Manipulation Details (indicate cue type and reason): Total A for cleanup after incontinence standing in [place] with 2 person assist today       General ADL Comments: Pt appears to have declined since previous session, decreased engagement and increased physical assist needed. Pt Total  A x 2 for all mobility tasks and toileting today     Vision   Vision Assessment?: No apparent visual deficits   Perception     Praxis      Cognition Arousal/Alertness: Awake/alert Behavior During Therapy: Flat affect Overall Cognitive Status: Impaired/Different from  baseline Area of Impairment: Attention;Memory;Following commands;Safety/judgement;Awareness;Problem solving;Orientation                 Orientation Level: Time;Situation Current Attention Level: Sustained Memory: Decreased short-term memory Following Commands: Follows one step commands inconsistently Safety/Judgement: Decreased awareness of safety;Decreased awareness of deficits Awareness: Intellectual Problem Solving: Slow processing;Decreased initiation;Difficulty sequencing;Requires verbal cues;Requires tactile cues General Comments: Pt continues with slow processing and slow motor planning, but notably more quiet today and not answering as many questions as previous sessions.        Exercises     Shoulder Instructions       General Comments      Pertinent Vitals/ Pain       Pain Assessment: No/denies pain Faces Pain Scale: No hurt  Home Living                                          Prior Functioning/Environment              Frequency  Min 1X/week        Progress Toward Goals  OT Goals(current goals can now be found in the care plan section)  Progress towards OT goals: Progressing toward goals  Acute Rehab OT Goals Patient Stated Goal: did not state OT Goal Formulation: With patient Time For Goal Achievement: 05/12/20 Potential to Achieve Goals: Fair ADL Goals Pt Will Perform Eating: with set-up;sitting Pt Will Perform Grooming: with min assist;sitting Pt Will Perform Upper Body Bathing: with mod assist;sitting Additional ADL Goal #1: Pt to demonstrate ability to sit EOB > 5 minutes with no more than Supervision required during ADL tasks to improve core strength Additional ADL Goal #2: Pt to demonstrate active cervical ROM HEP with no more than min verbal/tactile cues to improve posture and comfort  Plan Discharge plan remains appropriate;Frequency needs to be updated    Co-evaluation    PT/OT/SLP Co-Evaluation/Treatment:  Yes Reason for Co-Treatment: Complexity of the patient's impairments (multi-system involvement);Necessary to address cognition/behavior during functional activity;For patient/therapist safety;To address functional/ADL transfers   OT goals addressed during session: ADL's and self-care;Other (comment) (ADL transfers)      AM-PAC OT "6 Clicks" Daily Activity     Outcome Measure   Help from another person eating meals?: A Little Help from another person taking care of personal grooming?: A Lot Help from another person toileting, which includes using toliet, bedpan, or urinal?: Total Help from another person bathing (including washing, rinsing, drying)?: Total Help from another person to put on and taking off regular upper body clothing?: A Lot Help from another person to put on and taking off regular lower body clothing?: Total 6 Click Score: 10    End of Session Equipment Utilized During Treatment: Gait belt  OT Visit Diagnosis: Unsteadiness on feet (R26.81);Other abnormalities of gait and mobility (R26.89);Muscle weakness (generalized) (M62.81);Other symptoms and signs involving cognitive function   Activity Tolerance Patient limited by fatigue;Patient limited by lethargy   Patient Left in chair;with call bell/phone within reach;with chair alarm set   Nurse Communication Mobility status        Time: [phone]  OT Time Calculation (min): 30 min  Charges: OT General Charges $OT Visit: 1 Visit OT Treatments $Self Care/Home Management : 8-22 mins  Layla Maw, OTR/L   Layla Maw 05/03/2020, 3:31 PM

## 2020-05-03 NOTE — Progress Notes (Signed)
PROGRESS NOTE    [name]  ZOX:[id number] DOB: 04/03/1956 DOA: 04/13/2020 PCP: Lujean Amel, MD   Brief Narrative: 64 year old with past medical history significant for dementia, Parkinson disease, hypertension, hypercalcemia, chronic diastolic heart failure presented with worsening confusion, poor oral intake for 3 days prior to admission.  Patient stopped taking her medication 3 days prior to admission as well.  On arrival to the ED patient was severely dehydrated sodium 154, chloride 117, calcium 11.2.  CT head showed chronic cortical right parietal lobe infarct, no acute findings. Patient was admitted with a diagnosis of hypovolemic hyponatremia and hypercalcemia and dehydration and metabolic encephalopathy.  Patient was a started on IV fluids, her electrolytes abnormality has resolved improved.  Patient became more alert and back to baseline however she remains intermittently confused due to her baseline dementia.  PT evaluated patient and is recommending a skilled nursing facility.  Her calcium level trended up again, PTH and vitamin D was noted to be normal,  SPEP normal, Ca improved and then has trended up again, given bisphosphonates and calcitonin, PTHrp pending -overall has remained stable, currently awaiting placement, does not have long term care insurance   Assessment & Plan:   Severe dehydration with hypernatremia: -Admitted with dehydration and multiple electrolyte abnormalities in the setting of dementia and Parkinson's disease -Improved, hydrated with IV fluids, electrolytes stabilized -Oral intake is improving, drinking Ensure daily -stable -Disposition remains challenging, does not have long-term care insurance, Education officer, museum following  Acute metabolic encephalopathy:  -Likely related to hypernatremia, hypercalcemia and dehydration TSH normal, ammonia level is 20, RPR negative, B12 is normal, no infectious process noted as well -Mental status has improved, PT OT  recommended rehab, this was declined by her insurance agency Dr. Doristine Bosworth did peer to peer review with Dr. Amalia Hailey and the recommendation is that patient qualifies for custodial care Social worker following for long-term placement -Mental status has improved, discussed goals of care and resuscitation again, wishes to be full code with full scope of treatment -Recommend palliative care follow-up at SNF  Hypercalcemia likely worsened by dehydration - PTH normal, vitamin D level 34.,  -SPEP without monoclonal protein -PTH rp still pending -Calcium level had improved with hydration, subsequently trended up again with corrected calcium of 12 on 8/7 , she was given a dose of Zometa on 8/6 and IV fluids as well as calcitonin 8/7  -Calcium improving  -Monitor   Anorexia:  Severe protein calorie malnutrition  -continue Ensure  Hypertension: Continue with Coreg, ibersartan.  Lasix yesterday for calcium  Parkinsons disease  dementia; Continue with Sinemet.  Thoracic aortic aneurysm -Incidentally noted on CT, consider follow-up  Oral thrush; nystatin, improved.   Conjunctivitis; on Cipro eyedrops  DVT prophylaxis:  Lovenox Code Status: full code Family Communication: No family at bedside Disposition Plan:  Status is: Inpatient  Remains inpatient appropriate because: Awaiting long-term placement  Dispo:  Patient From:  Home  Planned Disposition:  Long-term care SNF  Expected discharge date: To be determined  Medically stable for discharge:     Consultants:   none  Procedures:   none  Antimicrobials:    Subjective: -No events overnight, laying in bed and resting most of the time, answers questions and is interactive  Objective: Vitals:   05/02/20 1853 05/02/20 2101 05/03/20 0410 05/03/20 0909  BP: 105/72 125/83 124/78 125/74  Pulse: 68 65 63 69  Resp: 18 15 16 18   Temp: 98.6 F (37 C) 98.4 F (36.9 C) 97.9 F (36.6 C)  98.7 F (37.1 C)  TempSrc: Oral  Oral Oral    SpO2: 100% 99% 98% 96%  Weight:  51 kg    Height:        Intake/Output Summary (Last 24 hours) at 05/03/2020 1154 Last data filed at 05/03/2020 0900 Gross per 24 hour  Intake 260 ml  Output 0 ml  Net 260 ml   Filed Weights   05/01/20 0859 05/02/20 0514 05/02/20 2101  Weight: 49.9 kg 50.9 kg 51 kg    Examination:  General exam: Frail chronically ill female appears much older than stated age, easily arousable, oriented to self and place, no distress HEENT: No JVD CVS: S1-S2, regular rate rhythm Lungs: Decreased breath sounds the bases Abdomen: Soft, nontender, bowel sounds present Extremities: No edema  Neuro: Moves all extremities, no localizing signs   Data Reviewed: I have personally reviewed following labs and imaging studies  CBC: Recent Labs  Lab 04/28/20 0611 05/02/20 0248 05/03/20 0203  WBC 3.8* 2.6* 3.4*  HGB 11.5* 11.3* 10.9*  HCT 37.2 35.6* 33.9*  MCV 83.4 83.0 83.7  PLT 187 170 509   Basic Metabolic Panel: Recent Labs  Lab 04/29/20 0351 04/30/20 0442 05/01/20 0354 05/02/20 0248 05/03/20 0203  NA 137 136 136 135 136  K 4.2 4.3 4.7 3.8 3.9  CL 107 105 103 106 109  CO2 21* 21* 20* 16* 20*  GLUCOSE 85 86 102* 103* 92  BUN 16 25* 23 27* 28*  CREATININE 0.90 0.90 0.98 0.94 0.84  CALCIUM 10.8* 10.9* 11.5* 9.6 9.5   GFR: Estimated Creatinine Clearance: 47.3 mL/min (by C-G formula based on SCr of 0.84 mg/dL). Liver Function Tests: Recent Labs  Lab 04/29/20 0351 04/30/20 0442 05/01/20 0354 05/02/20 0248 05/03/20 0203  AST 25 24 31 27 24   ALT 11 14 31 17 10   ALKPHOS 71 74 84 70 66  BILITOT 0.6 0.3 0.7 0.4 0.3  PROT 7.1 6.8 8.4* 7.2 7.0  ALBUMIN 3.0* 2.9* 3.4* 3.0* 2.9*   No results for input(s): LIPASE, AMYLASE in the last 168 hours. Recent Labs  Lab 05/02/20 0248  AMMONIA 27   Coagulation Profile: No results for input(s): INR, PROTIME in the last 168 hours. Cardiac Enzymes: No results for input(s): CKTOTAL, CKMB, CKMBINDEX, TROPONINI  in the last 168 hours. BNP (last 3 results) No results for input(s): PROBNP in the last 8760 hours. HbA1C: No results for input(s): HGBA1C in the last 72 hours. CBG: Recent Labs  Lab 04/26/20 2048 05/01/20 2349  GLUCAP 107* 91   Lipid Profile: No results for input(s): CHOL, HDL, LDLCALC, TRIG, CHOLHDL, LDLDIRECT in the last 72 hours. Thyroid Function Tests: No results for input(s): TSH, T4TOTAL, FREET4, T3FREE, THYROIDAB in the last 72 hours. Anemia Panel: No results for input(s): VITAMINB12, FOLATE, FERRITIN, TIBC, IRON, RETICCTPCT in the last 72 hours. Sepsis Labs: No results for input(s): PROCALCITON, LATICACIDVEN in the last 168 hours.  No results found for this or any previous visit (from the past 240 hour(s)).       Radiology Studies: CT HEAD WO CONTRAST  Result Date: 05/02/2020 CLINICAL DATA:  Acute neurologic deficit EXAM: CT HEAD WITHOUT CONTRAST TECHNIQUE: Contiguous axial images were obtained from the base of the skull through the vertex without intravenous contrast. COMPARISON:  04/13/2020 FINDINGS: Brain: There is no mass, hemorrhage or extra-axial collection. There is generalized atrophy without lobar predilection. Hypodensity of the white matter is most commonly associated with chronic microvascular disease. Vascular: Atherosclerotic calcification of the vertebral and internal  carotid arteries at the skull base. No abnormal hyperdensity of the major intracranial arteries or dural venous sinuses. Skull: The visualized skull base, calvarium and extracranial soft tissues are normal. Sinuses/Orbits: No fluid levels or advanced mucosal thickening of the visualized paranasal sinuses. No mastoid or middle ear effusion. The orbits are normal. IMPRESSION: Generalized atrophy and chronic microvascular ischemia without acute intracranial abnormality. Electronically Signed   By: Ulyses Jarred M.D.   On: 05/02/2020 03:13   CT ANGIO CHEST PE W OR WO CONTRAST  Result Date:  05/02/2020 CLINICAL DATA:  Elevated D-dimer, episodes of unresponsiveness EXAM: CT ANGIOGRAPHY CHEST WITH CONTRAST TECHNIQUE: Multidetector CT imaging of the chest was performed using the standard protocol during bolus administration of intravenous contrast. Multiplanar CT image reconstructions and MIPs were obtained to evaluate the vascular anatomy. CONTRAST:  65mL OMNIPAQUE IOHEXOL 350 MG/ML SOLN COMPARISON:  Chest radiograph dated 05/02/2020. FINDINGS: Cardiovascular: Satisfactory opacification of the bilateral pulmonary arteries to the subsegmental level. No evidence of pulmonary embolism. Prosthetic aortic valve. 4.2 cm ascending thoracic aortic aneurysm (coronal image 30). Atherosclerotic calcifications of the aortic arch. The heart is top-normal in size. No pericardial effusion. Left subclavian ICD. Three vessel coronary atherosclerosis. Mediastinum/Nodes: No suspicious mediastinal lymphadenopathy. Visualized thyroid is unremarkable. Lungs/Pleura: No suspicious pulmonary nodules. No focal consolidation. Minimal dependent atelectasis in the bilateral lower lobes, left greater than right. Mild centrilobular emphysematous changes, upper lung predominant. No pleural effusion or pneumothorax. Upper Abdomen: Visualized upper abdomen is grossly unremarkable, noting vascular calcifications. Musculoskeletal: Exaggerated thoracic kyphosis with mild degenerative changes of the mid thoracic spine. Median sternotomy. Review of the MIP images confirms the above findings. IMPRESSION: No evidence of pulmonary embolism. Prosthetic aortic valve with 4.2 cm ascending thoracic aortic aneurysm. Recommend annual imaging followup by CTA or MRA. This recommendation follows 2010 ACCF/AHA/AATS/ACR/ASA/SCA/SCAI/SIR/STS/SVM Guidelines for the Diagnosis and Management of Patients with Thoracic Aortic Disease. Circulation. 2010; 121: Z610-R604. Aortic aneurysm NOS (ICD10-I71.9) Aortic Atherosclerosis (ICD10-I70.0) and Emphysema  (ICD10-J43.9). Electronically Signed   By: Julian Hy M.D.   On: 05/02/2020 07:57   DG CHEST PORT 1 VIEW  Result Date: 05/02/2020 CLINICAL DATA:  Lethargy EXAM: PORTABLE CHEST 1 VIEW COMPARISON:  None. FINDINGS: The heart size and mediastinal contours are within normal limits. Both lungs are clear. The visualized skeletal structures are unremarkable. Unchanged position of left chest wall AICD leads. Remote median sternotomy and aortic valve replacement. IMPRESSION: No active disease. Electronically Signed   By: Ulyses Jarred M.D.   On: 05/02/2020 02:16        Scheduled Meds: . aspirin  325 mg Oral Daily  . carbidopa-levodopa  1 tablet Oral TID  . carvedilol  12.5 mg Oral BID WC  . chlorhexidine  15 mL Mouth Rinse BID  . ciprofloxacin  1 drop Right Eye Q4H while awake  . divalproex  125 mg Oral Daily  . donepezil  10 mg Oral Daily  . enoxaparin (LOVENOX) injection  40 mg Subcutaneous Q24H  . feeding supplement (ENSURE ENLIVE)  237 mL Oral QID  . hydroxypropyl methylcellulose / hypromellose  1 drop Both Eyes TID  . irbesartan  75 mg Oral Daily  . labetalol  20 mg Intravenous Once  . mouth rinse  15 mL Mouth Rinse q12n4p  . megestrol  400 mg Oral Daily  . multivitamin with minerals  1 tablet Oral Daily  . nystatin  5 mL Oral QID  . polyethylene glycol  17 g Oral Daily  . rosuvastatin  10 mg Oral Daily  .  senna-docusate  1 tablet Oral BID  . sertraline  100 mg Oral QHS  . sodium chloride flush  3 mL Intravenous Once   Continuous Infusions:    LOS: 20 days   Time spent: 25 minutes.   Domenic Polite, MD [facility]   05/03/2020, 11:54 AM

## 2020-05-03 NOTE — Plan of Care (Signed)
  Problem: Clinical Measurements: Goal: Will remain free from infection Outcome: Progressing Goal: Cardiovascular complication will be avoided Outcome: Progressing   Problem: Nutrition: Goal: Adequate nutrition will be maintained Outcome: Progressing   Problem: Pain Managment: Goal: General experience of comfort will improve Outcome: Progressing   

## 2020-05-04 ENCOUNTER — Inpatient Hospital Stay (HOSPITAL_COMMUNITY): Payer: HMO

## 2020-05-04 DIAGNOSIS — E860 Dehydration: Secondary | ICD-10-CM | POA: Diagnosis not present

## 2020-05-04 DIAGNOSIS — E870 Hyperosmolality and hypernatremia: Secondary | ICD-10-CM | POA: Diagnosis not present

## 2020-05-04 DIAGNOSIS — R627 Adult failure to thrive: Secondary | ICD-10-CM | POA: Diagnosis not present

## 2020-05-04 LAB — URINALYSIS, COMPLETE (UACMP) WITH MICROSCOPIC
Bilirubin Urine: NEGATIVE
Glucose, UA: NEGATIVE mg/dL
Hgb urine dipstick: NEGATIVE
Ketones, ur: NEGATIVE mg/dL
Nitrite: NEGATIVE
Protein, ur: 100 mg/dL — AB
Specific Gravity, Urine: 1.02 (ref 1.005–1.030)
pH: 7 (ref 5.0–8.0)

## 2020-05-04 LAB — LACTIC ACID, PLASMA: Lactic Acid, Venous: 1 mmol/L (ref 0.5–1.9)

## 2020-05-04 MED ORDER — SODIUM CHLORIDE 0.9 % IV SOLN
1.5000 g | Freq: Four times a day (QID) | INTRAVENOUS | Status: DC
  Administered 2020-05-04 – 2020-05-09 (×21): 1.5 g via INTRAVENOUS
  Filled 2020-05-04 (×15): qty 4
  Filled 2020-05-04: qty 1.5
  Filled 2020-05-04 (×7): qty 4

## 2020-05-04 MED ORDER — ACETAMINOPHEN 325 MG PO TABS
650.0000 mg | ORAL_TABLET | Freq: Four times a day (QID) | ORAL | Status: DC | PRN
  Administered 2020-05-04 – 2020-05-20 (×6): 650 mg via ORAL
  Filled 2020-05-04 (×7): qty 2

## 2020-05-04 MED ORDER — SODIUM CHLORIDE 0.9 % IV SOLN: INTRAVENOUS | Status: DC

## 2020-05-04 NOTE — Plan of Care (Signed)
  Problem: Clinical Measurements: Goal: Respiratory complications will improve Outcome: Progressing Goal: Cardiovascular complication will be avoided Outcome: Progressing   

## 2020-05-04 NOTE — Progress Notes (Signed)
Nutrition Follow-up  DOCUMENTATION CODES:   Non-severe (moderate) malnutrition in context of chronic illness  INTERVENTION:  Continue Ensure Enlive poQID, each supplement provides 350 kcal and 20 grams of protein  ContinueMagic cup TID with meals, each supplement provides 290 kcal and 9 grams of protein  Continue MVI daily   NUTRITION DIAGNOSIS:   Moderate Malnutrition related to chronic illness (dementia) as evidenced by mild muscle depletion, mild fat depletion, moderate fat depletion, moderate muscle depletion, percent weight loss.  Ongoing  GOAL:   Patient will meet greater than or equal to 90% of their needs  Progressing  MONITOR:   PO intake, Supplement acceptance, Weight trends, Labs, I & O's  REASON FOR ASSESSMENT:   Consult Assessment of nutrition requirement/status  ASSESSMENT:   Pt admitted with severe dehydration and hypernatremia and acute encephalopathy. PMH includes advanced dementia, Parkinson disease, HTN, hypercalcemia, CHF.  Per MD, pt is febrile today, suspect aspiration pneumonia. SLP following. MD also noted that pt appeared to be more alert and closer to baseline yesterday; MD suspects that pt's hypercalcemia was causing pt's lethargy. Pt's calcium is improving.  Discussed pt with RN who reports pt previously drinking supplements, but refused today as they didn't have her preferred flavor.   If pt's PO intake does not improve and if pt stops consuming supplements well, enteral nutrition should be initiated.    PO Intake: 0-25% x last 8 recorded meals (10% average meal intake)  Labs: Corrected calcium 10.38 (H, trending down) Medications: Ensure Enlive QID, Megace, MVI, Miralax, Senokot-S  Diet Order:   Diet Order            DIET DYS 3 Room service appropriate? Yes; Fluid consistency: Thin  Diet effective now                 EDUCATION NEEDS:   No education needs have been identified at this time  Skin:  Skin Assessment: Skin  Integrity Issues: Skin Integrity Issues:: Other (Comment) Other: MASD buttocks  Last BM:  8/8 type 4  Height:   Ht Readings from Last 1 Encounters:  05/01/20 5' (1.524 m)    Weight:   Wt Readings from Last 1 Encounters:  05/02/20 51 kg   BMI:  Body mass index is 21.95 kg/m.  Estimated Nutritional Needs:   Kcal:  1400-1600  Protein:  70-85 grams  Fluid:  >1.4L/d    Larkin Ina, MS, RD, LDN RD pager number and weekend/on-call pager number located in [place].

## 2020-05-04 NOTE — Progress Notes (Signed)
PROGRESS NOTE    [name]  YQM:[id number] DOB: 1955-12-19 DOA: 04/13/2020 PCP: Lujean Amel, MD   Brief Narrative: 64 year old with past medical history significant for dementia, Parkinson disease, hypertension, hypercalcemia, chronic diastolic heart failure presented with worsening confusion, poor oral intake for 3 days prior to admission.  Patient stopped taking her medication 3 days prior to admission as well.  On arrival to the ED patient was severely dehydrated sodium 154, chloride 117, calcium 11.2.  CT head showed chronic cortical right parietal lobe infarct, no acute findings. Patient was admitted with a diagnosis of hypovolemic hyponatremia and hypercalcemia and dehydration and metabolic encephalopathy.  Patient was a started on IV fluids, her electrolytes abnormality has resolved improved.  Patient became more alert and back to baseline however she remains intermittently confused due to her baseline dementia.  PT evaluated patient and is recommending a skilled nursing facility.  Her calcium level trended up again, PTH and vitamin D was noted to be normal,  SPEP normal, Ca improved and then has trended up again, given bisphosphonates and calcitonin, PTHrp pending -overall has remained stable, currently awaiting placement, does not have long term care insurance   Assessment & Plan:   Severe dehydration with hypernatremia: -Admitted with dehydration and multiple electrolyte abnormalities in the setting of dementia and Parkinson's disease -Improved, hydrated with IV fluids, electrolytes stabilized -Oral intake was improving improving, drinking Ensure -Disposition remains challenging, does not have long-term care insurance, Education officer, museum following -Restarted on fluids 8/10 due to fever and lethargy  Acute metabolic encephalopathy:  -Initially this was secondary to hypernatremia, severe hypercalcemia and dehydration  -Additional extensive work-up was negative  -Mental status  improved but still remained extremely debilitated with failure to thrive, PT OT recommended rehab, this was declined by her insurance agency Dr. Doristine Bosworth did peer to peer review with Dr. Amalia Hailey and the recommendation is that patient qualifies for custodial care Social worker following for long-term placement -Earlier when she was mentating well I discussed goals of care and resuscitation again on account of poor prognosis, patient wishes to be full code with full scope of treatment -Recommend palliative care follow-up at SNF -8/10 AM with lethargy and fever, productive cough, suspect aspiration pneumonia-follow-up blood cultures, urine looks unremarkable, monitor for retention -Start IV Unasyn  Suspected aspiration pneumonia -SLP following, her mental status has waxed and waned in the setting of hypercalcemia, which may have contributed to aspiration -Does have a productive cough this morning in the setting of fever of 101 -Start IV Unasyn -Follow-up blood cultures  Hypercalcemia likely worsened by dehydration - PTH normal, vitamin D level 34.,  -SPEP without monoclonal protein -PTH rp still pending -Calcium level had improved with hydration, subsequently trended up again with corrected calcium of 12 on 8/7 , she was given a dose of Zometa on 8/6 and IV fluids as well as calcitonin 8/7  -Calcium improving  -Monitor   Anorexia:  Severe protein calorie malnutrition  -continue Ensure  Hypertension: Continue with Coreg, ibersartan.  Lasix yesterday for calcium  Parkinsons disease  dementia; Continue with Sinemet.  Thoracic aortic aneurysm -Incidentally noted on CT, consider follow-up  Oral thrush; nystatin, improved.   Conjunctivitis; on Cipro eyedrops  Palliative care encounter -When patient's mental status was better and she was more alert, I discussed CODE STATUS with her, poor prognosis emphasized, continue to wish for full code and remains hopeful for rehab  DVT prophylaxis:   Lovenox Code Status: full code Family Communication: No family at bedside Disposition  Plan:  Status is: Inpatient  Remains inpatient appropriate because: Awaiting long-term placement  Dispo:  Patient From:  Home  Planned Disposition:  Long-term care SNF  Expected discharge date: To be determined  Medically not stable for discharge today:     Consultants:   none  Procedures:   none  Antimicrobials:    Subjective: -Early this morning spiked temp of 101, also having a productive cough, chest x-ray with possible lower lobe atelectasis  Objective: Vitals:   05/04/20 0421 05/04/20 0453 05/04/20 0700 05/04/20 0941  BP: (!) 138/93   133/82  Pulse: 80   81  Resp: 18   20  Temp: (!) 101.1 F (38.4 C) (!) 101.4 F (38.6 C) (!) 100.5 F (38.1 C) (!) 101.4 F (38.6 C)  TempSrc:  Oral Oral Oral  SpO2: 96%   97%  Weight:      Height:        Intake/Output Summary (Last 24 hours) at 05/04/2020 1115 Last data filed at 05/04/2020 0800 Gross per 24 hour  Intake 360 ml  Output 250 ml  Net 110 ml   Filed Weights   05/01/20 0859 05/02/20 0514 05/02/20 2101  Weight: 49.9 kg 50.9 kg 51 kg    Examination:  General exam: Frail chronically ill female, appears much older than stated age, somnolent, easily arousable, oriented to self and place, no distress, feels warm to touch HEENT: No JVD CVS: S1-S2, regular rate rhythm Lungs: Decreased breath sounds the bases Abdomen: Soft, nontender, bowel sounds present Extremities: No edema Neuro: Moves all extremities, no localizing signs   Data Reviewed: I have personally reviewed following labs and imaging studies  CBC: Recent Labs  Lab 04/28/20 0611 05/02/20 0248 05/03/20 0203  WBC 3.8* 2.6* 3.4*  HGB 11.5* 11.3* 10.9*  HCT 37.2 35.6* 33.9*  MCV 83.4 83.0 83.7  PLT 187 170 762   Basic Metabolic Panel: Recent Labs  Lab 04/29/20 0351 04/30/20 0442 05/01/20 0354 05/02/20 0248 05/03/20 0203  NA 137 136 136 135 136  K  4.2 4.3 4.7 3.8 3.9  CL 107 105 103 106 109  CO2 21* 21* 20* 16* 20*  GLUCOSE 85 86 102* 103* 92  BUN 16 25* 23 27* 28*  CREATININE 0.90 0.90 0.98 0.94 0.84  CALCIUM 10.8* 10.9* 11.5* 9.6 9.5   GFR: Estimated Creatinine Clearance: 47.3 mL/min (by C-G formula based on SCr of 0.84 mg/dL). Liver Function Tests: Recent Labs  Lab 04/29/20 0351 04/30/20 0442 05/01/20 0354 05/02/20 0248 05/03/20 0203  AST 25 24 31 27 24   ALT 11 14 31 17 10   ALKPHOS 71 74 84 70 66  BILITOT 0.6 0.3 0.7 0.4 0.3  PROT 7.1 6.8 8.4* 7.2 7.0  ALBUMIN 3.0* 2.9* 3.4* 3.0* 2.9*   No results for input(s): LIPASE, AMYLASE in the last 168 hours. Recent Labs  Lab 05/02/20 0248  AMMONIA 27   Coagulation Profile: No results for input(s): INR, PROTIME in the last 168 hours. Cardiac Enzymes: No results for input(s): CKTOTAL, CKMB, CKMBINDEX, TROPONINI in the last 168 hours. BNP (last 3 results) No results for input(s): PROBNP in the last 8760 hours. HbA1C: No results for input(s): HGBA1C in the last 72 hours. CBG: Recent Labs  Lab 05/01/20 2349  GLUCAP 91   Lipid Profile: No results for input(s): CHOL, HDL, LDLCALC, TRIG, CHOLHDL, LDLDIRECT in the last 72 hours. Thyroid Function Tests: No results for input(s): TSH, T4TOTAL, FREET4, T3FREE, THYROIDAB in the last 72 hours. Anemia Panel: No results  for input(s): VITAMINB12, FOLATE, FERRITIN, TIBC, IRON, RETICCTPCT in the last 72 hours. Sepsis Labs: Recent Labs  Lab 05/04/20 0926  LATICACIDVEN 1.0    No results found for this or any previous visit (from the past 240 hour(s)).       Radiology Studies: DG CHEST PORT 1 VIEW  Result Date: 05/04/2020 CLINICAL DATA:  Fever EXAM: PORTABLE CHEST 1 VIEW COMPARISON:  May 02, 2020 chest CT and CT angiogram chest FINDINGS: There is atelectatic change in the left base. The lungs elsewhere are clear. Heart is upper normal in size with pulmonary vascularity normal. Pacemaker leads are attached to the right  atrium, right ventricle, and coronary sinus. Patient is status post aortic valve replacement. No adenopathy. There is aortic atherosclerosis. No bone lesions. IMPRESSION: Atelectatic change left base. Lungs elsewhere clear. Stable cardiac silhouette. Postoperative changes noted. Aortic Atherosclerosis (ICD10-I70.0). Electronically Signed   By: Lowella Grip III M.D.   On: 05/04/2020 08:20        Scheduled Meds: . aspirin  325 mg Oral Daily  . carbidopa-levodopa  1 tablet Oral TID  . carvedilol  12.5 mg Oral BID WC  . chlorhexidine  15 mL Mouth Rinse BID  . ciprofloxacin  1 drop Right Eye Q4H while awake  . divalproex  125 mg Oral Daily  . donepezil  10 mg Oral Daily  . enoxaparin (LOVENOX) injection  40 mg Subcutaneous Q24H  . feeding supplement (ENSURE ENLIVE)  237 mL Oral QID  . hydroxypropyl methylcellulose / hypromellose  1 drop Both Eyes TID  . irbesartan  75 mg Oral Daily  . labetalol  20 mg Intravenous Once  . mouth rinse  15 mL Mouth Rinse q12n4p  . megestrol  400 mg Oral Daily  . multivitamin with minerals  1 tablet Oral Daily  . nystatin  5 mL Oral QID  . polyethylene glycol  17 g Oral Daily  . rosuvastatin  10 mg Oral Daily  . senna-docusate  1 tablet Oral BID  . sertraline  100 mg Oral QHS  . sodium chloride flush  3 mL Intravenous Once   Continuous Infusions: . sodium chloride 75 mL/hr at 05/04/20 0845  . ampicillin-sulbactam (UNASYN) IV 1.5 g (05/04/20 0934)     LOS: 21 days   Time spent: 25 minutes.   Domenic Polite, MD [facility]   05/04/2020, 11:15 AM

## 2020-05-04 NOTE — Progress Notes (Addendum)
Floor coverage  Patient febrile with oral temperature 101.4.  Remainder of vital signs stable.  Not tachycardic or hypotensive.  WBC count on labs done this morning 3.4.  -Labs/imaging ordered to assess for possible infectious source: Chest x-ray, lactate, UA, urine culture, blood culture x2 -Tylenol as needed for fevers

## 2020-05-04 NOTE — Consult Note (Signed)
   [facility] CM Inpatient Consult   05/04/2020  [name] April 20, 1956 [id number]  Follow up: Active member in [place]   Chart reviewed briefly for length of stay hospitalization. Patient was from [facility] facility.  Currently patient noted in progress notes to continue to have fever and lethargy per MD notes today with dehydration. [name] noted per [facility] LCSW was wanting a different ALF prior to admission. Went by and patient was asleep and no family at bedside.   Plan:  Patient with Long term care needs noted.  Following for disposition/transition of care.  Will notify [facility] team member regarding follow up.  Natividad Brood, RN BSN [facility] Liaison  [phone] business mobile phone Toll free office [phone]  Fax number: [phone] [name]

## 2020-05-04 NOTE — Progress Notes (Signed)
Patient with temp of 101.1,MD on call text paged. Orders received via epic. Will continue to monitor.

## 2020-05-04 NOTE — Progress Notes (Signed)
Pharmacy Antibiotic Note  [name] is a 64 y.o. female admitted on 04/13/2020 with aspiration pneumonia. Patient febrile with atelectatic change in the left base. Pharmacy has been consulted for Unasyn dosing.  Plan: Unasyn 1.5gm IV q6h Monitor for clinical course, LOT, and deescalation  Height: 5' (152.4 cm) Weight: 51 kg (112 lb 6.5 oz) IBW/kg (Calculated) : 45.5  Temp (24hrs), Avg:99.8 F (37.7 C), Min:97.8 F (36.6 C), Max:101.4 F (38.6 C)  Recent Labs  Lab 04/28/20 0611 04/28/20 0611 04/29/20 0351 04/30/20 0442 05/01/20 0354 05/02/20 0248 05/03/20 0203  WBC 3.8*  --   --   --   --  2.6* 3.4*  CREATININE 0.69   < > 0.90 0.90 0.98 0.94 0.84   < > = values in this interval not displayed.    Estimated Creatinine Clearance: 47.3 mL/min (by C-G formula based on SCr of 0.84 mg/dL).    Allergies  Allergen Reactions  . Trospium Chloride Er Other (See Comments)    Constipation       Shanele Nissan A. Levada Dy, PharmD, BCPS, FNKF Clinical Pharmacist [place] Please utilize Amion for appropriate phone number to reach the unit pharmacist ([place])   05/04/2020 8:36 AM

## 2020-05-05 DIAGNOSIS — I10 Essential (primary) hypertension: Secondary | ICD-10-CM | POA: Diagnosis not present

## 2020-05-05 DIAGNOSIS — E870 Hyperosmolality and hypernatremia: Secondary | ICD-10-CM | POA: Diagnosis not present

## 2020-05-05 DIAGNOSIS — E860 Dehydration: Secondary | ICD-10-CM | POA: Diagnosis not present

## 2020-05-05 DIAGNOSIS — G20 Parkinson's disease: Secondary | ICD-10-CM | POA: Diagnosis not present

## 2020-05-05 LAB — CBC
HCT: 35.7 % — ABNORMAL LOW (ref 36.0–46.0)
Hemoglobin: 11.4 g/dL — ABNORMAL LOW (ref 12.0–15.0)
MCH: 26.1 pg (ref 26.0–34.0)
MCHC: 31.9 g/dL (ref 30.0–36.0)
MCV: 81.9 fL (ref 80.0–100.0)
Platelets: 139 10*3/uL — ABNORMAL LOW (ref 150–400)
RBC: 4.36 MIL/uL (ref 3.87–5.11)
RDW: 19.5 % — ABNORMAL HIGH (ref 11.5–15.5)
WBC: 5.2 10*3/uL (ref 4.0–10.5)
nRBC: 0 % (ref 0.0–0.2)

## 2020-05-05 LAB — COMPREHENSIVE METABOLIC PANEL
ALT: 16 U/L (ref 0–44)
AST: 36 U/L (ref 15–41)
Albumin: 2.9 g/dL — ABNORMAL LOW (ref 3.5–5.0)
Alkaline Phosphatase: 64 U/L (ref 38–126)
Anion gap: 10 (ref 5–15)
BUN: 22 mg/dL (ref 8–23)
CO2: 15 mmol/L — ABNORMAL LOW (ref 22–32)
Calcium: 9.5 mg/dL (ref 8.9–10.3)
Chloride: 108 mmol/L (ref 98–111)
Creatinine, Ser: 0.72 mg/dL (ref 0.44–1.00)
GFR calc Af Amer: >60 mL/min (ref >60)
GFR calc non Af Amer: >60 mL/min (ref >60)
Glucose, Bld: 87 mg/dL (ref 70–99)
Potassium: 4.2 mmol/L (ref 3.5–5.1)
Sodium: 133 mmol/L — ABNORMAL LOW (ref 135–145)
Total Bilirubin: 0.3 mg/dL (ref 0.3–1.2)
Total Protein: 7.3 g/dL (ref 6.5–8.1)

## 2020-05-05 LAB — PTH-RELATED PEPTIDE: PTH-related peptide: <2 pmol/L

## 2020-05-05 NOTE — Progress Notes (Addendum)
PROGRESS NOTE  [name] IRS:[id number] DOB: [birth year] DOA: 04/13/2020 PCP: Lujean Amel, MD   LOS: 22 days   Brief narrative: As per HPI,  64 year old with past medical history significant for dementia, Parkinson disease, hypertension, hypercalcemia, chronic diastolic heart failure presented to the hospital with worsening confusion, poor oral intake for 3 days prior to admission.  Patient stopped taking her medication 3 days prior to admission as well.  On arrival to the ED, patient was severely dehydrated with sodium 154, chloride 117, calcium 11.2.  CT head showed chronic cortical right parietal lobe infarct, no acute findings. Patient was admitted with a diagnosis of hypercalcemia and dehydration and metabolic encephalopathy.  Patient was a started on IV fluids, her electrolytes abnormality improved.  Patient became more alert and back to baseline however she remains intermittently confused due to her baseline dementia.  PT evaluated patient and is recommending a skilled nursing facility.  Her calcium level trended up again, PTH and vitamin D was noted to be normal,  SPEP normal, Ca improved and then has trended up again, given bisphosphonates and calcitonin, PTHrp pending. 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  Has improved at this time.  Sodium of 133 at this time.Restarted on fluids 8/10 due to fever and lethargy.  Will reassess in a.m.  Acute metabolic encephalopathy:   secondary to hypernatremia, severe hypercalcemia and dehydration.  Still encephalopathic likely from new  aspiration pneumonia and fever.  Urinalysis was negative.  Blood cultures were drawn on 05/04/2020 pending. extensive work-up for encephalopathy was negative.  Patient does have failure to thrive, dementia parkinsonism. Dr. Doristine Bosworth did peer to peer review with Dr. Amalia Hailey and the recommendation is that patient qualifies for custodial care. Social worker following for long-term placement.  Currently full code .  Plan is palliative care at the skilled nursing facility.  Patient has been started on Unasyn since 05/04/2020.   Suspected aspiration pneumonia Secondary to metabolic encephalopathy.  Continue aspiration precautions.  Continue Unasyn.  Follow blood cultures.  Currently on dysphagia 3 diet.  T-max of 100 F.  Hypercalcemia likely worsened by dehydration - PTH normal, vitamin D level 34. SPEP without monoclonal protein. -PTH rp still pending.  Calcium level initially improved with hydration but trended up again so patient received 1 dose of Zometa on 8/6 and is still on IV fluids.   Calcium level had improved with hydration, subsequently trended up again with corrected calcium of 12 on 8/7 so she was given a dose of Zometa on 8/6.  Calcium levels improving.  Will closely monitor.  Anorexia/Severe protein calorie malnutrition  -continue Ensure, Megace  Essential hypertension: Continue with Coreg, ibersartan.    Latest blood pressure of 140/84  Parkinsons disease /dementia; Continue Sinemet, donepezil  Thoracic aortic aneurysm -Incidentally noted on CT scanning, will need outpatient follow-up  Oral thrush; nystatin, improved.   Conjunctivitis; on Cipro eyedrops  Palliative care encounter  full code and remains hopeful for rehab  DVT prophylaxis: enoxaparin (LOVENOX) injection 40 mg Start: 04/25/20 1400   Code Status: Full code  Family Communication: None   Status is: Inpatient  Remains inpatient appropriate because:Unsafe  DC plan, awaiting long-term  placement.   Dispo:  Patient From:  Home  Planned Disposition:  Long-term skilled nursing facility  Expected discharge date:  To be determined  Medically stable for discharge:  None.  Consultants:  None  Procedures:  None  Antibiotics:  . Unasyn started 05/04/2020  Anti-infectives (From admission, onward)   Start     Dose/Rate Route Frequency Ordered Stop   05/04/20 0845  ampicillin-sulbactam (UNASYN) 1.5 g in sodium chloride 0.9 % 100 mL IVPB     Discontinue     1.5 g 200 mL/hr over 30 Minutes Intravenous Every 6 hours 05/04/20 0838        Subjective: Today, patient was seen and examined at bedside.  Patient is mostly nonverbal appears lethargic.  Responding to painful stimuli  Objective: Vitals:   05/04/20 2108 05/05/20 0558  BP: 102/65 (!) 177/99  Pulse: 71 63  Resp: 20 20  Temp: 99.6 F (37.6 C) 98 F (36.7 C)  SpO2: 97% 98%    Intake/Output Summary (Last 24 hours) at 05/05/2020 0725 Last data filed at 05/05/2020 0600 Gross per 24 hour  Intake 2430.96 ml  Output 800 ml  Net 1630.96 ml   Filed Weights   05/02/20 0514 05/02/20 2101 05/04/20 2108  Weight: 50.9 kg 51 kg 52.7 kg   Body mass index is 22.69 kg/m.   Physical Exam: GENERAL: Patient is lethargic, nonverbal, responds to painful stimuli.  Frail chronically ill female HENT: No scleral pallor or icterus. Pupils equally reactive to light. Oral mucosa is moist NECK: is supple, no gross swelling noted. CHEST: Diminished breath sounds bilaterally.  Coarse breath sounds noted. CVS: S1 and S2 heard, no murmur. Regular rate and rhythm.  ABDOMEN: Soft, non-tender, bowel sounds are present.  External urinary catheter in place. EXTREMITIES: No edema. CNS: Responds to painful stimulus, lethargic, moves extremities. SKIN: warm and dry without rashes.  Data Review: I have personally reviewed the following laboratory data and studies,  CBC: Recent Labs  Lab 05/02/20 0248 05/03/20 0203 05/05/20 0300   WBC 2.6* 3.4* 5.2  HGB 11.3* 10.9* 11.4*  HCT 35.6* 33.9* 35.7*  MCV 83.0 83.7 81.9  PLT 170 186 364*   Basic Metabolic Panel: Recent Labs  Lab 04/30/20 0442 05/01/20 0354 05/02/20 0248 05/03/20 0203 05/05/20 0300  NA 136 136 135 136 133*  K 4.3 4.7 3.8 3.9 4.2  CL 105 103 106 109 108  CO2 21* 20* 16* 20* 15*  GLUCOSE 86 102* 103* 92 87  BUN 25* 23 27* 28* 22  CREATININE 0.90 0.98 0.94 0.84 0.72  CALCIUM 10.9* 11.5* 9.6 9.5 9.5   Liver Function Tests: Recent Labs  Lab 04/30/20 0442 05/01/20 0354 05/02/20 0248 05/03/20 0203 05/05/20 0300  AST 24 31 27 24  36  ALT 14 31 17 10 16   ALKPHOS 74 84 70 66 64  BILITOT 0.3 0.7 0.4 0.3 0.3  PROT 6.8 8.4* 7.2 7.0 7.3  ALBUMIN 2.9* 3.4* 3.0* 2.9* 2.9*   No results for input(s): LIPASE, AMYLASE in the last 168 hours. Recent Labs  Lab 05/02/20 0248  AMMONIA 27   Cardiac Enzymes: No results for input(s): CKTOTAL, CKMB, CKMBINDEX, TROPONINI in the last 168 hours. BNP (last 3 results) No results for input(s): BNP in the last 8760 hours.  ProBNP (last 3 results) No results for input(s): PROBNP in the last 8760 hours.  CBG: Recent Labs  Lab 05/01/20 2349  GLUCAP 91   No results found for this or any  previous visit (from the past 240 hour(s)).   Studies: DG CHEST PORT 1 VIEW  Result Date: 05/04/2020 CLINICAL DATA:  Fever EXAM: PORTABLE CHEST 1 VIEW COMPARISON:  May 02, 2020 chest CT and CT angiogram chest FINDINGS: There is atelectatic change in the left base. The lungs elsewhere are clear. Heart is upper normal in size with pulmonary vascularity normal. Pacemaker leads are attached to the right atrium, right ventricle, and coronary sinus. Patient is status post aortic valve replacement. No adenopathy. There is aortic atherosclerosis. No bone lesions. IMPRESSION: Atelectatic change left base. Lungs elsewhere clear. Stable cardiac silhouette. Postoperative changes noted. Aortic Atherosclerosis (ICD10-I70.0). Electronically  Signed   By: Lowella Grip III M.D.   On: 05/04/2020 08:20      Tiffany Lipps, MD  [facility] 05/05/2020

## 2020-05-05 NOTE — Clinical Social Work Note (Signed)
Patient needs LTC placement and CSW has begun working on state [place] form. Once completed, it will be faxed to Adult Medicaid worker Cherene Altes.   Talked with Adult Medicaid Supervisor Sharmon Leyden on 8/10 (2:23 pm) regarding form and she advised CSW to fax completed form to [name] [phone]). Spoke again to Ms. Evelene Croon on 8/11 ( 9 am) regarding [place] forms and CSW's questions addressed by Ms. Evelene Croon.  [name], MSW, LCSW Licensed Clinical Social Worker [place] [phone]

## 2020-05-05 NOTE — Plan of Care (Signed)
  Problem: Clinical Measurements: Goal: Diagnostic test results will improve Outcome: Completed/Met

## 2020-05-06 ENCOUNTER — Ambulatory Visit: Payer: Self-pay | Admitting: *Deleted

## 2020-05-06 DIAGNOSIS — G20 Parkinson's disease: Secondary | ICD-10-CM | POA: Diagnosis not present

## 2020-05-06 DIAGNOSIS — E860 Dehydration: Secondary | ICD-10-CM | POA: Diagnosis not present

## 2020-05-06 DIAGNOSIS — I10 Essential (primary) hypertension: Secondary | ICD-10-CM | POA: Diagnosis not present

## 2020-05-06 DIAGNOSIS — E870 Hyperosmolality and hypernatremia: Secondary | ICD-10-CM | POA: Diagnosis not present

## 2020-05-06 LAB — COMPREHENSIVE METABOLIC PANEL
ALT: 30 U/L (ref 0–44)
AST: 30 U/L (ref 15–41)
Albumin: 2.8 g/dL — ABNORMAL LOW (ref 3.5–5.0)
Alkaline Phosphatase: 56 U/L (ref 38–126)
Anion gap: 10 (ref 5–15)
BUN: 17 mg/dL (ref 8–23)
CO2: 15 mmol/L — ABNORMAL LOW (ref 22–32)
Calcium: 9.5 mg/dL (ref 8.9–10.3)
Chloride: 110 mmol/L (ref 98–111)
Creatinine, Ser: 0.65 mg/dL (ref 0.44–1.00)
GFR calc Af Amer: >60 mL/min (ref >60)
GFR calc non Af Amer: >60 mL/min (ref >60)
Glucose, Bld: 91 mg/dL (ref 70–99)
Potassium: 3.8 mmol/L (ref 3.5–5.1)
Sodium: 135 mmol/L (ref 135–145)
Total Bilirubin: 0.3 mg/dL (ref 0.3–1.2)
Total Protein: 6.9 g/dL (ref 6.5–8.1)

## 2020-05-06 LAB — CBC
HCT: 34.4 % — ABNORMAL LOW (ref 36.0–46.0)
Hemoglobin: 10.9 g/dL — ABNORMAL LOW (ref 12.0–15.0)
MCH: 25.8 pg — ABNORMAL LOW (ref 26.0–34.0)
MCHC: 31.7 g/dL (ref 30.0–36.0)
MCV: 81.5 fL (ref 80.0–100.0)
Platelets: 164 10*3/uL (ref 150–400)
RBC: 4.22 MIL/uL (ref 3.87–5.11)
RDW: 19.6 % — ABNORMAL HIGH (ref 11.5–15.5)
WBC: 5.2 10*3/uL (ref 4.0–10.5)
nRBC: 0 % (ref 0.0–0.2)

## 2020-05-06 LAB — PHOSPHORUS: Phosphorus: 1.4 mg/dL — ABNORMAL LOW (ref 2.5–4.6)

## 2020-05-06 LAB — MAGNESIUM: Magnesium: 1.8 mg/dL (ref 1.7–2.4)

## 2020-05-06 NOTE — TOC Progression Note (Signed)
Transition of Care [facility]) - Progression Note    Patient Details  Name: [name] MRN: [id number] Date of Birth: 1956/06/30  [facility]) CM/SW Contact  Sharlet Salina Mila Homer, LCSW Phone Number: 05/06/2020, 11:54 AM  Clinical Narrative: Adult Care Home [place]-2 Form completed, signed by MD and faxed to Adult Medicaid worker Cherene Altes. [name] was emailed and informed that form faxed to her. CSW will continue to follow and provide SW intervention services as needed through discharge..       Expected Discharge Plan: Skilled Nursing Facility Barriers to Discharge: Ship broker, Other (comment) (Initiating facility search)  Expected Discharge Plan and Services Expected Discharge Plan: [place] In-house Referral: Clinical Social Work     Living arrangements for the past 2 months: [employer])                                     Social Determinants of Health (SDOH) Interventions  Patient in need of LTC placement. Working with [place] Medicaid worker and completed [place] form to assist in application for LTC Medicaid.  Readmission Risk Interventions Readmission Risk Prevention Plan 01/02/2020 12/25/2019  Transportation Screening Complete Complete  PCP or Specialist Appt within 5-7 Days Complete Complete  Home Care Screening Complete Complete  Medication Review (RN CM) Complete Complete  Some recent data might be hidden

## 2020-05-06 NOTE — Plan of Care (Signed)
  Problem: Pain Managment: Goal: General experience of comfort will improve Outcome: Progressing   

## 2020-05-06 NOTE — Progress Notes (Signed)
Physical Therapy Treatment Patient Details Name: [name] MRN: [id number] DOB: [birth year] Today's Date: 05/06/2020    History of Present Illness [name] is a 64 y.o. female with medical history significant of advanced dementia, Parkinson disease, hypertension, hypercalcemia, chronic diastolic CHF, presented with worsening confusion and not eating or drinking x3 days.  Admitted with metabolic encephalopathy and hypernatremia.  Per TOC notes, pt unable to return to ALF due to level of assist, awaiting Medicaid for SNF placement.    PT Comments    The pt is continuing to make slow progress with therapy as she was able to more actively participate in today's session despite prolonged standing in the stedy to allow for the pt to have a BM. The pt reported significant fatigue following this stand, but was able to continue with conversation and engagement in session that she was unable to do previously due to fatigue. The pt will continue to progress with therapy to improve activity tolerance, functional strength and mobility, and speed of movement to reduce caregiver burden following d/c.    Follow Up Recommendations  SNF     Equipment Recommendations  Hospital bed;Wheelchair (measurements PT)    Recommendations for Other Services       Precautions / Restrictions Precautions Precautions: Fall Restrictions Weight Bearing Restrictions: No    Mobility  Bed Mobility Overal bed mobility: Needs Assistance Bed Mobility: Supine to Sit     Supine to sit: Max assist;+2 for physical assistance     General bed mobility comments: pt able to initiate small amount of BLE movement to reach EOB but inconsistently, able to reach for bed rail to pull or pull on therapist hand to raise trunk with increased time, cues and some hand-over hand assist. able to hold bed rail once hand is placed  Transfers Overall transfer level: Needs assistance Equipment used: Ambulation equipment  used Transfers: Sit to/from Stand Sit to Stand: Total assist;+2 physical assistance Stand pivot transfers: Total assist;+2 physical assistance       General transfer comment: totalA to power up from elevated bed with use of bed pad to facilitate hip ext. Pt then requiring maxA of 2 to maintain standing for 5-7 min while the pt had a BM. maxA to lower, pt holding bar with BUE  Ambulation/Gait Ambulation/Gait assistance:  (pt unable at this time)           General Gait Details: unable        Balance Overall balance assessment: Needs assistance Sitting-balance support: Feet supported;Bilateral upper extremity supported;No upper extremity supported Sitting balance-Leahy Scale: Fair Sitting balance - Comments: Initially requiring assistance to maintain sitting balance but demonstrated ability to hold self up with B UE and L lateral lean noted Postural control: Posterior lean;Left lateral lean Standing balance support: Bilateral upper extremity supported Standing balance-Leahy Scale: Zero Standing balance comment: Total A x 2 to maintain standing balance in [place]. Unable to hold self up                            Cognition Arousal/Alertness: Awake/alert Behavior During Therapy: Flat affect Overall Cognitive Status: Impaired/Different from baseline Area of Impairment: Attention;Memory;Following commands;Safety/judgement;Awareness;Problem solving                   Current Attention Level: Sustained Memory: Decreased short-term memory Following Commands: Follows one step commands inconsistently;Follows one step commands with increased time Safety/Judgement: Decreased awareness of safety;Decreased awareness of deficits Awareness: Intellectual  Problem Solving: Slow processing;Decreased initiation;Difficulty sequencing;Requires verbal cues;Requires tactile cues General Comments: Pt more engaged and talkative during this session, but does continue to demo slowed  processing and benefits from increased time for answering simple questions or repeated cues.      Exercises      General Comments General comments (skin integrity, edema, etc.): VSS, pt with large BM and RN staff notified      Pertinent Vitals/Pain Pain Assessment: No/denies pain           PT Goals (current goals can now be found in the care plan section)      Frequency    Min 2X/week      PT Plan Current plan remains appropriate       AM-PAC PT "6 Clicks" Mobility   Outcome Measure  Help needed turning from your back to your side while in a flat bed without using bedrails?: A Lot Help needed moving from lying on your back to sitting on the side of a flat bed without using bedrails?: A Lot Help needed moving to and from a bed to a chair (including a wheelchair)?: Total Help needed standing up from a chair using your arms (e.g., wheelchair or bedside chair)?: A Lot Help needed to walk in hospital room?: Total Help needed climbing 3-5 steps with a railing? : Total 6 Click Score: 9    End of Session Equipment Utilized During Treatment: Gait belt Activity Tolerance: Patient tolerated treatment well Patient left: in chair;with call bell/phone within reach;with chair alarm set Nurse Communication: Mobility status PT Visit Diagnosis: Unsteadiness on feet (R26.81);Other abnormalities of gait and mobility (R26.89);Muscle weakness (generalized) (M62.81);History of falling (Z91.81)     Time: 4801-6553 PT Time Calculation (min) (ACUTE ONLY): 35 min  Charges:  $Gait Training: 8-22 mins $Therapeutic Activity: 8-22 mins                     Karma Ganja, PT, DPT   Acute Rehabilitation Department Pager #: [phone]   Otho Bellows 05/06/2020, 3:51 PM

## 2020-05-06 NOTE — Progress Notes (Signed)
PROGRESS NOTE  [name] [id number]:[id number] DOB: [birth year] DOA: 04/13/2020 PCP: Lujean Amel, MD   LOS: 23 days   Brief narrative: As per HPI,  64 year old with past medical history significant for dementia, Parkinson disease, hypertension, hypercalcemia, chronic diastolic heart failure presented to the hospital with worsening confusion, poor oral intake for 3 days prior to admission.  Patient stopped taking her medication 3 days prior to admission as well.  On arrival to the ED, patient was severely dehydrated with sodium 154, chloride 117, calcium 11.2.  CT head showed chronic cortical right parietal lobe infarct, no acute findings. Patient was admitted with a diagnosis of hypercalcemia and dehydration and metabolic encephalopathy.  Patient was a started on IV fluids, her electrolytes abnormality improved.  Patient became more alert and back to baseline however she remains intermittently confused due to her baseline dementia.  PT evaluated patient and is recommending a skilled nursing facility.  Her calcium level trended up again, PTH and vitamin D was noted to be normal,  SPEP normal, Ca improved and then has trended up again, given bisphosphonates and calcitonin, PTHrp pending.  Currently awaiting placement, does not have long term care insurance.   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  Has improved at this time.  Sodium of 135 at this time. Restarted on fluids 8/10 due to fever and lethargy.  Temperature max of 98.2 F.  Assess for fluids in a.m.  Acute metabolic encephalopathy:   Secondary to hypernatremia, severe hypercalcemia and dehydration.  Improved today. Urinalysis was  negative.  Blood cultures were drawn on 05/04/2020 negative in 2 days.  Extensive work-up for encephalopathy was negative.  Patient does have failure to thrive, dementia, parkinsonism.   Patient has been started on Unasyn since 05/04/2020.  We will complete 5-day course of antibiotic.  Could consider oral on discharge.  Suspected aspiration pneumonia Secondary to metabolic encephalopathy.  Continue aspiration precautions.  Continue Unasyn.  Blood cultures negative in 2 days.  Currently on dysphagia 3 diet.  T-max of 98.9F  Hypercalcemia likely worsened by dehydration. PTH normal, vitamin D level 34. SPEP without monoclonal protein. -PTH rp <2.0.  Received 1 dose of Zometa on 8/6 and is still on IV fluids.     Will closely monitor.  Anorexia/Severe protein calorie malnutrition  -continue Ensure, Megace.  Very low albumin.  Essential hypertension: Continue with Coreg, ibersartan.    Latest blood pressure of 114/76  Parkinsons disease /dementia; Continue Sinemet, donepezil  Thoracic aortic aneurysm Incidentally noted on CT scanning, will need outpatient follow-up  Oral thrush; nystatin, improved.   Conjunctivitis; on Cipro eyedrops  Palliative care encounter Full code. Social worker following for long-term placement.  Plan is palliative care at the skilled nursing facility.  DVT prophylaxis: enoxaparin (LOVENOX) injection 40 mg Start: 04/25/20 1400   Code Status: Full code  Family Communication: None  Status is: Inpatient  Remains inpatient appropriate because:Unsafe DC plan, awaiting long-term placement.  Dispo:  Patient From:  Home  Planned Disposition: [place]  Expected discharge date:  To be determined  Medically stable for discharge: Yes  Consultants:  None  Procedures:  None  Antibiotics:  . Unasyn started 05/04/2020  Anti-infectives (From admission, onward)   Start  Dose/Rate Route Frequency Ordered Stop   05/04/20 0845   ampicillin-sulbactam (UNASYN) 1.5 g in sodium chloride 0.9 % 100 mL IVPB     Discontinue     1.5 g 200 mL/hr over 30 Minutes Intravenous Every 6 hours 05/04/20 0838       Subjective: Today, patient was seen and examined at bedside.  Patient is more alert awake and communicative today.  Denies any pain, shortness of breath, fever or chills. Was drinking some Ensure.  Objective: Vitals:   05/05/20 2036 05/06/20 0543  BP: (!) 171/94 (!) 154/91  Pulse: (!) 55 (!) 58  Resp: 14 16  Temp: 98.2 F (36.8 C) 98 F (36.7 C)  SpO2: 98% 99%    Intake/Output Summary (Last 24 hours) at 05/06/2020 0741 Last data filed at 05/06/2020 0600 Gross per 24 hour  Intake 2407.09 ml  Output 1400 ml  Net 1007.09 ml   Filed Weights   05/02/20 2101 05/04/20 2108 05/05/20 2036  Weight: 51 kg 52.7 kg 54.2 kg   Body mass index is 23.34 kg/m.   Physical Exam:  GENERAL: Patient is alert awake and communicative, frail chronically ill female HENT: No scleral pallor or icterus. Pupils equally reactive to light. Oral mucosa is moist NECK: is supple, no gross swelling noted. CHEST: Diminished breath sounds bilaterally.  Coarse breath sounds noted. CVS: S1 and S2 heard, no murmur. Regular rate and rhythm.  ABDOMEN: Soft, non-tender, bowel sounds are present.  External urinary catheter in place. EXTREMITIES: No edema. CNS: Alert awake and communicative.  Moving extremities. SKIN: warm and dry without rashes.  Data Review: I have personally reviewed the following laboratory data and studies,  CBC: Recent Labs  Lab 05/02/20 0248 05/03/20 0203 05/05/20 0300 05/06/20 0442  WBC 2.6* 3.4* 5.2 5.2  HGB 11.3* 10.9* 11.4* 10.9*  HCT 35.6* 33.9* 35.7* 34.4*  MCV 83.0 83.7 81.9 81.5  PLT 170 186 139* 035   Basic Metabolic Panel: Recent Labs  Lab 05/01/20 0354 05/02/20 0248 05/03/20 0203 05/05/20 0300 05/06/20 0442  NA 136 135 136 133* 135  K 4.7 3.8 3.9 4.2 3.8  CL 103 106 109 108 110  CO2 20* 16*  20* 15* 15*  GLUCOSE 102* 103* 92 87 91  BUN 23 27* 28* 22 17  CREATININE 0.98 0.94 0.84 0.72 0.65  CALCIUM 11.5* 9.6 9.5 9.5 9.5  MG  --   --   --   --  1.8  PHOS  --   --   --   --  1.4*   Liver Function Tests: Recent Labs  Lab 05/01/20 0354 05/02/20 0248 05/03/20 0203 05/05/20 0300 05/06/20 0442  AST 31 27 24  36 30  ALT 31 17 10 16 30   ALKPHOS 84 70 66 64 56  BILITOT 0.7 0.4 0.3 0.3 0.3  PROT 8.4* 7.2 7.0 7.3 6.9  ALBUMIN 3.4* 3.0* 2.9* 2.9* 2.8*   No results for input(s): LIPASE, AMYLASE in the last 168 hours. Recent Labs  Lab 05/02/20 0248  AMMONIA 27   Cardiac Enzymes: No results for input(s): CKTOTAL, CKMB, CKMBINDEX, TROPONINI in the last 168 hours. BNP (last 3 results) No results for input(s): BNP in the last 8760 hours.  ProBNP (last 3 results) No results for input(s): PROBNP in the last 8760 hours.  CBG: Recent Labs  Lab 05/01/20 2349  GLUCAP 91   Recent Results (from the past 240 hour(s))  Culture, blood (routine x 2)     Status: None (Preliminary result)  Collection Time: 05/04/20  9:26 AM   Specimen: BLOOD  Result Value Ref Range Status   Specimen Description BLOOD LEFT ANTECUBITAL  Final   Special Requests   Final    BOTTLES DRAWN AEROBIC AND ANAEROBIC Blood Culture adequate volume   Culture   Final    NO GROWTH 1 DAY Performed at [facility], [place] [address], [place], [place] [address]    Report Status PENDING  Incomplete  Culture, blood (routine x 2)     Status: None (Preliminary result)   Collection Time: 05/04/20  9:31 AM   Specimen: BLOOD LEFT HAND  Result Value Ref Range Status   Specimen Description BLOOD LEFT HAND  Final   Special Requests   Final    BOTTLES DRAWN AEROBIC AND ANAEROBIC Blood Culture adequate volume   Culture   Final    NO GROWTH 1 DAY Performed at [facility], [place] [address], [place], [place] [address]    Report Status PENDING  Incomplete     Studies: No results found.    Flora Lipps,  MD  [facility] 05/06/2020

## 2020-05-07 DIAGNOSIS — I10 Essential (primary) hypertension: Secondary | ICD-10-CM | POA: Diagnosis not present

## 2020-05-07 DIAGNOSIS — E870 Hyperosmolality and hypernatremia: Secondary | ICD-10-CM | POA: Diagnosis not present

## 2020-05-07 DIAGNOSIS — G20 Parkinson's disease: Secondary | ICD-10-CM | POA: Diagnosis not present

## 2020-05-07 DIAGNOSIS — E860 Dehydration: Secondary | ICD-10-CM | POA: Diagnosis not present

## 2020-05-07 LAB — COMPREHENSIVE METABOLIC PANEL WITH GFR
ALT: 27 U/L (ref 0–44)
AST: 30 U/L (ref 15–41)
Albumin: 2.8 g/dL — ABNORMAL LOW (ref 3.5–5.0)
Alkaline Phosphatase: 60 U/L (ref 38–126)
Anion gap: 8 (ref 5–15)
BUN: 14 mg/dL (ref 8–23)
CO2: 18 mmol/L — ABNORMAL LOW (ref 22–32)
Calcium: 9.9 mg/dL (ref 8.9–10.3)
Chloride: 110 mmol/L (ref 98–111)
Creatinine, Ser: 0.6 mg/dL (ref 0.44–1.00)
GFR calc Af Amer: >60 mL/min
GFR calc non Af Amer: >60 mL/min
Glucose, Bld: 99 mg/dL (ref 70–99)
Potassium: 3.7 mmol/L (ref 3.5–5.1)
Sodium: 136 mmol/L (ref 135–145)
Total Bilirubin: 0.3 mg/dL (ref 0.3–1.2)
Total Protein: 7.3 g/dL (ref 6.5–8.1)

## 2020-05-07 LAB — CBC
HCT: 35.1 % — ABNORMAL LOW (ref 36.0–46.0)
Hemoglobin: 11.3 g/dL — ABNORMAL LOW (ref 12.0–15.0)
MCH: 26 pg (ref 26.0–34.0)
MCHC: 32.2 g/dL (ref 30.0–36.0)
MCV: 80.9 fL (ref 80.0–100.0)
Platelets: 182 K/uL (ref 150–400)
RBC: 4.34 MIL/uL (ref 3.87–5.11)
RDW: 19.7 % — ABNORMAL HIGH (ref 11.5–15.5)
WBC: 5.6 K/uL (ref 4.0–10.5)
nRBC: 0 % (ref 0.0–0.2)

## 2020-05-07 LAB — MAGNESIUM: Magnesium: 1.9 mg/dL (ref 1.7–2.4)

## 2020-05-07 NOTE — Progress Notes (Signed)
Pharmacy Antibiotic Note  [name] is a 64 y.o. female admitted on 04/13/2020 with severe dehydration and acute encephalopathy.  On 05/04/20 pharmacy was consulted  for Unasyn dosing for suspected aspiration pneumonia.  Day # 3 of Unasyn.  Tmax 98.5, Scr stable <1,  CrCl 49.7 ml/min Blood cultures : no growth to date x 3 days   Plan: Continue Unasyn 1.5gm IV q6h Monitor for clinical course, LOT, and deescalation  Height: 5' (152.4 cm) Weight: 53.7 kg (118 lb 6.2 oz) IBW/kg (Calculated) : 45.5  Temp (24hrs), Avg:98.5 F (36.9 C), Min:97.7 F (36.5 C), Max:99.5 F (37.5 C)  Recent Labs  Lab 05/02/20 0248 05/03/20 0203 05/04/20 0926 05/05/20 0300 05/06/20 0442 05/07/20 0650  WBC 2.6* 3.4*  --  5.2 5.2 5.6  CREATININE 0.94 0.84  --  0.72 0.65 0.60  LATICACIDVEN  --   --  1.0  --   --   --     Estimated Creatinine Clearance: 49.7 mL/min (by C-G formula based on SCr of 0.6 mg/dL).    Allergies  Allergen Reactions  . Trospium Chloride Er Other (See Comments)    Constipation     8/10 Unasyn>>  8/10 BCx: ngtd x 3 days    Nicole Cella, RPh Clinical Pharmacist [place] Please utilize Amion for appropriate phone number to reach the unit pharmacist ([place])  05/07/2020 3:01 PM

## 2020-05-07 NOTE — Progress Notes (Addendum)
PROGRESS NOTE  [name] CBJ:[id number] DOB: December 13, 1955 DOA: 04/13/2020 PCP: Lujean Amel, MD   LOS: 24 days   Brief narrative: As per HPI,  64 year old with past medical history significant for dementia, Parkinson disease, hypertension, hypercalcemia, chronic diastolic heart failure presented to the hospital with worsening confusion, poor oral intake for 3 days prior to admission.  Patient stopped taking her medication 3 days prior to admission as well.  On arrival to the ED, patient was severely dehydrated with sodium 154, chloride 117, calcium 11.2.  CT head showed chronic cortical right parietal lobe infarct, no acute findings. Patient was admitted with a diagnosis of hypercalcemia and dehydration and metabolic encephalopathy.  Patient was a started on IV fluids, her electrolytes abnormality improved.  Patient became more alert and back to baseline however she remains intermittently confused due to her baseline dementia.  PT evaluated patient and is recommending a skilled nursing facility.  Her calcium level trended up again, PTH and vitamin D was noted to be normal,  SPEP normal, Ca improved and then has trended up again, given bisphosphonates and calcitonin, PTHrp pending.  Currently awaiting placement, does not have long term care insurance.   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  Has improved at this time.  Sodium of 136 at this time. Restarted on fluids 8/10 due to fever and lethargy.  Temperature max of 98.2 F. Will discontinue IV fluids.  Positive balance for around 10 L.  Acute metabolic encephalopathy:   Secondary to hypernatremia, severe hypercalcemia and dehydration.   Improved. Urinalysis was negative.  Blood cultures were drawn on 05/04/2020 negative in 2 days.  Extensive work-up for encephalopathy was negative.  Patient does have failure to thrive, dementia, parkinsonism.   Patient has been started on Unasyn since 05/04/2020.  We will complete 5-day course of antibiotic.  Could consider oral on discharge if a bed is found.  Suspected aspiration pneumonia Secondary to metabolic encephalopathy.  Continue aspiration precautions.  Continue Unasyn.  Blood cultures negative in 2 days.  Currently on dysphagia 3 diet.  T-max of 98.67F  Hypercalcemia likely worsened by dehydration. PTH normal, vitamin D level 34. SPEP without monoclonal protein. PTH rp <2.0.  Received 1 dose of Zometa on 8/6.     Will closely monitor.  Corrected calcium borderline high at 10.9.  Anorexia/Severe protein calorie malnutrition  -continue Ensure, Megace.  Very low albumin.  Essential hypertension: Continue with Coreg, ibersartan.    Latest blood pressure of 114/76  Parkinsons disease /dementia; Continue Sinemet, donepezil  Thoracic aortic aneurysm Incidentally noted on CT scanning, will need outpatient follow-up  Oral thrush; nystatin, improved.   Conjunctivitis; on Cipro eyedrops  Palliative care encounter Full code. Social worker following for long-term placement.  Plan is palliative care at the skilled nursing facility.  DVT prophylaxis: enoxaparin (LOVENOX) injection 40 mg Start: 04/25/20 1400   Code Status: Full code  Family Communication: I spoke the patient's son [name] on the phone and updated him about the clinical condition of the patient.  Status is: Inpatient  Remains inpatient appropriate because:Unsafe DC plan, awaiting long-term placement.  Dispo:  Patient From:  Home  Planned Disposition: [place] with palliative care  Expected discharge date:  To be determined  Medically stable for discharge:  Yes  Consultants:  None  Procedures:  None  Antibiotics:  . Unasyn started 05/04/2020  Anti-infectives (From admission, onward)   Start     Dose/Rate Route Frequency Ordered Stop   05/04/20 0845  ampicillin-sulbactam (UNASYN) 1.5 g in sodium chloride 0.9 % 100 mL IVPB     Discontinue     1.5 g 200 mL/hr over 30 Minutes Intravenous Every 6 hours 05/04/20 0838       Subjective: Today, patient was seen and examined at bedside.  Patient denies any overt pain, shortness of breath cough or fever.    Objective: Vitals:   05/07/20 0447 05/07/20 0917  BP: (!) 176/102 (!) 160/84  Pulse: 67 61  Resp: 17 18  Temp: 98.8 F (37.1 C) 99.5 F (37.5 C)  SpO2: 97% 99%    Intake/Output Summary (Last 24 hours) at 05/07/2020 1143 Last data filed at 05/07/2020 0900 Gross per 24 hour  Intake 2828.06 ml  Output 1350 ml  Net 1478.06 ml   Filed Weights   05/04/20 2108 05/05/20 2036 05/07/20 0447  Weight: 52.7 kg 54.2 kg 53.7 kg   Body mass index is 23.12 kg/m.   Physical Exam:  GENERAL: Patient is alert awake and communicative, frail, chronically ill female, slow to respond HENT: No scleral pallor or icterus. Pupils equally reactive to light. Oral mucosa is moist NECK: is supple, no gross swelling noted. CHEST: Diminished breath sounds bilaterally.  Coarse breath sounds noted.  CABG scar noted. CVS: S1 and S2 heard, no murmur. Regular rate and rhythm.  ABDOMEN: Soft, non-tender, bowel sounds are present.  External urinary catheter in place. EXTREMITIES: No edema. CNS: Alert awake and communicative.  Moving extremities. SKIN: warm and dry without rashes.  Data Review: I have personally reviewed the following laboratory data and studies,  CBC: Recent Labs  Lab 05/02/20 0248 05/03/20 0203 05/05/20 0300 05/06/20 0442 05/07/20 0650  WBC 2.6* 3.4* 5.2 5.2 5.6  HGB 11.3* 10.9* 11.4* 10.9* 11.3*  HCT 35.6* 33.9* 35.7* 34.4* 35.1*  MCV 83.0 83.7 81.9 81.5 80.9  PLT 170 186 139*  164 712   Basic Metabolic Panel: Recent Labs  Lab 05/02/20 0248 05/03/20 0203 05/05/20 0300 05/06/20 0442 05/07/20 0650  NA 135 136 133* 135 136  K 3.8 3.9 4.2 3.8 3.7  CL 106 109 108 110 110  CO2 16* 20* 15* 15* 18*  GLUCOSE 103* 92 87 91 99  BUN 27* 28* 22 17 14   CREATININE 0.94 0.84 0.72 0.65 0.60  CALCIUM 9.6 9.5 9.5 9.5 9.9  MG  --   --   --  1.8 1.9  PHOS  --   --   --  1.4*  --    Liver Function Tests: Recent Labs  Lab 05/02/20 0248 05/03/20 0203 05/05/20 0300 05/06/20 0442 05/07/20 0650  AST 27 24 36 30 30  ALT 17 10 16 30 27   ALKPHOS 70 66 64 56 60  BILITOT 0.4 0.3 0.3 0.3 0.3  PROT 7.2 7.0 7.3 6.9 7.3  ALBUMIN 3.0* 2.9* 2.9* 2.8* 2.8*   No results for input(s): LIPASE, AMYLASE in the last 168 hours. Recent Labs  Lab 05/02/20 0248  AMMONIA 27   Cardiac Enzymes: No results for input(s): CKTOTAL, CKMB, CKMBINDEX, TROPONINI in the last 168 hours. BNP (last 3 results) No results for input(s): BNP in the last 8760 hours.  ProBNP (last 3 results) No results for input(s): PROBNP in the last  8760 hours.  CBG: Recent Labs  Lab 05/01/20 2349  GLUCAP 91   Recent Results (from the past 240 hour(s))  Culture, blood (routine x 2)     Status: None (Preliminary result)   Collection Time: 05/04/20  9:26 AM   Specimen: BLOOD  Result Value Ref Range Status   Specimen Description BLOOD LEFT ANTECUBITAL  Final   Special Requests   Final    BOTTLES DRAWN AEROBIC AND ANAEROBIC Blood Culture adequate volume   Culture   Final    NO GROWTH 3 DAYS Performed at [facility], [address], [place], [place] [address]    Report Status PENDING  Incomplete  Culture, blood (routine x 2)     Status: None (Preliminary result)   Collection Time: 05/04/20  9:31 AM   Specimen: BLOOD LEFT HAND  Result Value Ref Range Status   Specimen Description BLOOD LEFT HAND  Final   Special Requests   Final    BOTTLES DRAWN AEROBIC AND ANAEROBIC Blood Culture adequate volume    Culture   Final    NO GROWTH 3 DAYS Performed at [facility], [place] [address], [place], [place] [address]    Report Status PENDING  Incomplete     Studies: No results found.    Flora Lipps, MD  [facility] 05/07/2020

## 2020-05-07 NOTE — Progress Notes (Signed)
Occupational Therapy Treatment Patient Details Name: [name] MRN: [id number] DOB: November 01, 1955 Today's Date: 05/07/2020    History of present illness [name] is a 64 y.o. female with medical history significant of advanced dementia, Parkinson disease, hypertension, hypercalcemia, chronic diastolic CHF, presented with worsening confusion and not eating or drinking x3 days.  Admitted with metabolic encephalopathy and hypernatremia.  Per TOC notes, pt unable to return to ALF due to level of assist, awaiting Medicaid for SNF placement.   OT comments  Pt. Seen for skilled OT treatment session.  Focus of session on self feeding.  Mod a to guide R UE towards mouth. Pt. Able to scoop with spoon and intiate movement of ue but required assistance to get to mouth.  Reaching for cup and able to swallow with cues before initiating another bite of food.    Follow Up Recommendations  SNF;Supervision/Assistance - 24 hour    Equipment Recommendations  Other (comment)    Recommendations for Other Services      Precautions / Restrictions Precautions Precautions: Fall       Mobility Bed Mobility                  Transfers                      Balance                                           ADL either performed or assessed with clinical judgement   ADL Overall ADL's : Needs assistance/impaired Eating/Feeding: Moderate assistance;Bed level Eating/Feeding Details (indicate cue type and reason): bed with hob elevated. pt. states she is R hand dominant.  assisted with positioning spoon in hand. able to scoop but noted difficulty bringing ue towards mouth secondary to rigidity.  mod guidance of ue towards mouth and pt. able to take bite. no tremors just difficulty bringing to mouth.  cues to swallow and sip afterwards to ensure she had swallowed all of her food. requesting rest break after 3 bites. dozing a little then back awake and wanted to resume  attempts at eating again                                   General ADL Comments: physical assistance to guide r ue towards mouth. no assistance to control utensil. cues for swallowing safety per documentation posted above bed     Vision       Perception     Praxis      Cognition Arousal/Alertness: Lethargic Behavior During Therapy: Flat affect                                            Exercises     Shoulder Instructions       General Comments      Pertinent Vitals/ Pain          Home Living                                          Prior Functioning/Environment  Frequency  Min 1X/week        Progress Toward Goals  OT Goals(current goals can now be found in the care plan section)  Progress towards OT goals: Progressing toward goals     Plan Discharge plan remains appropriate;Frequency needs to be updated    Co-evaluation                 AM-PAC OT "6 Clicks" Daily Activity     Outcome Measure   Help from another person eating meals?: A Little Help from another person taking care of personal grooming?: A Lot Help from another person toileting, which includes using toliet, bedpan, or urinal?: Total Help from another person bathing (including washing, rinsing, drying)?: Total Help from another person to put on and taking off regular upper body clothing?: A Lot Help from another person to put on and taking off regular lower body clothing?: Total 6 Click Score: 10    End of Session    OT Visit Diagnosis: Unsteadiness on feet (R26.81);Other abnormalities of gait and mobility (R26.89);Muscle weakness (generalized) (M62.81);Other symptoms and signs involving cognitive function   Activity Tolerance Patient limited by fatigue;Patient limited by lethargy   Patient Left in bed;with nursing/sitter in room   Nurse Communication Other (comment) (reviewed with CNA who was present and  intially and resuming assisting feeding pt. with the safe swallow rules posted above bed)        Time: 1219-1227 OT Time Calculation (min): 8 min  Charges: OT General Charges $OT Visit: 1 Visit OT Treatments $Self Care/Home Management : 8-22 mins  Sonia Baller, COTA/L Acute Rehabilitation [phone]   Janice Coffin 05/07/2020, 2:20 PM

## 2020-05-08 DIAGNOSIS — I10 Essential (primary) hypertension: Secondary | ICD-10-CM | POA: Diagnosis not present

## 2020-05-08 DIAGNOSIS — E860 Dehydration: Secondary | ICD-10-CM | POA: Diagnosis not present

## 2020-05-08 DIAGNOSIS — E870 Hyperosmolality and hypernatremia: Secondary | ICD-10-CM | POA: Diagnosis not present

## 2020-05-08 DIAGNOSIS — G20 Parkinson's disease: Secondary | ICD-10-CM | POA: Diagnosis not present

## 2020-05-08 LAB — COMPREHENSIVE METABOLIC PANEL
ALT: 18 U/L (ref 0–44)
AST: 29 U/L (ref 15–41)
Albumin: 2.8 g/dL — ABNORMAL LOW (ref 3.5–5.0)
Alkaline Phosphatase: 57 U/L (ref 38–126)
Anion gap: 9 (ref 5–15)
BUN: 13 mg/dL (ref 8–23)
CO2: 18 mmol/L — ABNORMAL LOW (ref 22–32)
Calcium: 9.7 mg/dL (ref 8.9–10.3)
Chloride: 110 mmol/L (ref 98–111)
Creatinine, Ser: 0.8 mg/dL (ref 0.44–1.00)
GFR calc Af Amer: >60 mL/min (ref >60)
GFR calc non Af Amer: >60 mL/min (ref >60)
Glucose, Bld: 99 mg/dL (ref 70–99)
Potassium: 3.9 mmol/L (ref 3.5–5.1)
Sodium: 137 mmol/L (ref 135–145)
Total Bilirubin: 0.3 mg/dL (ref 0.3–1.2)
Total Protein: 6.8 g/dL (ref 6.5–8.1)

## 2020-05-08 LAB — CBC
HCT: 33.6 % — ABNORMAL LOW (ref 36.0–46.0)
Hemoglobin: 10.8 g/dL — ABNORMAL LOW (ref 12.0–15.0)
MCH: 26.2 pg (ref 26.0–34.0)
MCHC: 32.1 g/dL (ref 30.0–36.0)
MCV: 81.6 fL (ref 80.0–100.0)
Platelets: 193 K/uL (ref 150–400)
RBC: 4.12 MIL/uL (ref 3.87–5.11)
RDW: 19.8 % — ABNORMAL HIGH (ref 11.5–15.5)
WBC: 5.5 K/uL (ref 4.0–10.5)
nRBC: 0 % (ref 0.0–0.2)

## 2020-05-08 NOTE — Progress Notes (Signed)
PROGRESS NOTE  [name] MBW:[id number] DOB: 1956-07-26 DOA: 04/13/2020 PCP: Lujean Amel, MD   LOS: 25 days   Brief narrative: As per HPI,  64 year old with past medical history significant for dementia, Parkinson disease, hypertension, hypercalcemia, chronic diastolic heart failure presented to the hospital with worsening confusion, poor oral intake for 3 days prior to admission.  Patient stopped taking her medication 3 days prior to admission as well.  On arrival to the ED, patient was severely dehydrated with sodium 154, chloride 117, calcium 11.2.  CT head showed chronic cortical right parietal lobe infarct, no acute findings. Patient was admitted with a diagnosis of hypercalcemia and dehydration and metabolic encephalopathy.  Patient was a started on IV fluids, her electrolytes abnormality improved.  Patient became more alert and back to baseline however she remains intermittently confused due to her baseline dementia.  PT evaluated patient and is recommending a skilled nursing facility.  Her calcium level trended up again, PTH and vitamin D was noted to be normal,  SPEP normal, Ca improved and then has trended up again, given bisphosphonates and calcitonin, PTHrp pending.  Currently awaiting placement, does not have long term care insurance.   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  Has improved at this time.  Sodium of 137 at this time. Off IV fluids.  Acute metabolic encephalopathy:   Secondary to hypernatremia, severe hypercalcemia and dehydration.  Improved. Urinalysis, blood cultures were negative. Extensive work-up for encephalopathy was negative.  Patient does have failure  to thrive, dementia, parkinsonism.   Patient has been started on Unasyn since 05/04/2020.  We will complete 5-day course of antibiotic.  Could consider oral on discharge if a bed is found.  Suspected aspiration pneumonia   Secondary to metabolic encephalopathy.  Continue aspiration precautions.  Continue Unasyn to complete course.  Blood cultures negative in 3 days.  Currently on dysphagia 3 diet.  T-max of 98.66F  Hypercalcemia likely worsened by dehydration. PTH normal, vitamin D level 34. SPEP without monoclonal protein. PTH rp <2.0.  Received 1 dose of Zometa on 8/6.     Will closely monitor.  Corrected calcium borderline high..  Anorexia/Severe protein calorie malnutrition  -continue Ensure, Megace.  Very low albumin.  Essential hypertension: Continue with Coreg, ibersartan.      Parkinsons disease /dementia; Continue Sinemet, donepezil  Thoracic aortic aneurysm Incidentally noted on CT scanning, will need outpatient follow-up  Oral thrush; nystatin, improved.   Conjunctivitis; on Cipro eyedrops  Palliative care encounter Full code. Social worker following for long-term placement.  Plan is palliative care at the skilled nursing facility.  DVT prophylaxis: enoxaparin (LOVENOX) injection 40 mg Start: 04/25/20 1400   Code Status: Full code  Family Communication: None today. I spoke the patient's son [name] on the phone yesterday  Status is: Inpatient  Remains inpatient appropriate because:Unsafe DC plan, awaiting long-term placement.  Dispo:  Patient From: [facility]isted Living Facility[facility]  Planned Disposition: [facility] with palliative care  Expected discharge date: 08/16/21To be determined  Medically stable for discharge: Yes  Consultants:  None  Procedures:  None  Antibiotics:  . Unasyn started 05/04/2020  Anti-infectives (From admission, onward)   Start     Dose/Rate Route Frequency Ordered Stop  05/04/20 0845   ampicillin-sulbactam (UNASYN) 1.5 g in sodium chloride 0.9 % 100 mL IVPB     Discontinue     1.5 g 200 mL/hr over 30 Minutes Intravenous Every 6 hours 05/04/20 0838       Subjective: Today, patient was seen and examined at bedside. Patient denies any nausea, vomiting, abdominal pain, fever or chills. Has had a bowel movement.    Objective: Vitals:   05/07/20 2050 05/08/20 0500  BP: 127/74 (!) 161/90  Pulse: 66 68  Resp: 17 18  Temp: 97.9 F (36.6 C) 97.8 F (36.6 C)  SpO2: 100% 98%    Intake/Output Summary (Last 24 hours) at 05/08/2020 1040 Last data filed at 05/08/2020 0500 Gross per 24 hour  Intake 737 ml  Output 2050 ml  Net -1313 ml   Filed Weights   05/04/20 2108 05/05/20 2036 05/07/20 0447  Weight: 52.7 kg 54.2 kg 53.7 kg   Body mass index is 23.12 kg/m.   Physical Exam:  GENERAL: Patient is alert awake and communicative, frail, chronically ill female, slow to respond HENT: No scleral pallor or icterus. Pupils equally reactive to light. Oral mucosa is moist NECK: is supple, no gross swelling noted. CHEST: Diminished breath sounds bilaterally.  Coarse breath sounds noted.  CABG scar noted. CVS: S1 and S2 heard, no murmur. Regular rate and rhythm.  ABDOMEN: Soft, non-tender, bowel sounds are present.  External urinary catheter in place. EXTREMITIES: No edema. CNS: Alert awake and communicative.  Moving extremities. SKIN: warm and dry without rashes.  Data Review: I have personally reviewed the following laboratory data and studies,  CBC: Recent Labs  Lab 05/03/20 0203 05/05/20 0300 05/06/20 0442 05/07/20 0650 05/08/20 0451  WBC 3.4* 5.2 5.2 5.6 5.5  HGB 10.9* 11.4* 10.9* 11.3* 10.8*  HCT 33.9* 35.7* 34.4* 35.1* 33.6*  MCV 83.7 81.9 81.5 80.9 81.6  PLT 186 139* 164 182 518   Basic Metabolic Panel: Recent Labs  Lab 05/03/20 0203 05/05/20 0300 05/06/20 0442 05/07/20 0650 05/08/20 0451  NA 136 133* 135 136 137  K 3.9 4.2 3.8 3.7 3.9  CL 109 108  110 110 110  CO2 20* 15* 15* 18* 18*  GLUCOSE 92 87 91 99 99  BUN 28* 22 17 14 13   CREATININE 0.84 0.72 0.65 0.60 0.80  CALCIUM 9.5 9.5 9.5 9.9 9.7  MG  --   --  1.8 1.9  --   PHOS  --   --  1.4*  --   --    Liver Function Tests: Recent Labs  Lab 05/03/20 0203 05/05/20 0300 05/06/20 0442 05/07/20 0650 05/08/20 0451  AST 24 36 30 30 29   ALT 10 16 30 27 18   ALKPHOS 66 64 56 60 57  BILITOT 0.3 0.3 0.3 0.3 0.3  PROT 7.0 7.3 6.9 7.3 6.8  ALBUMIN 2.9* 2.9* 2.8* 2.8* 2.8*   No results for input(s): LIPASE, AMYLASE in the last 168 hours. Recent Labs  Lab 05/02/20 0248  AMMONIA 27   Cardiac Enzymes: No results for input(s): CKTOTAL, CKMB, CKMBINDEX, TROPONINI in the last 168 hours. BNP (last 3 results) No results for input(s): BNP in the last 8760 hours.  ProBNP (last 3 results) No results for input(s): PROBNP in the last 8760 hours.  CBG: Recent Labs  Lab 05/01/20 2349  GLUCAP 91   Recent Results (from the past 240 hour(s))  Culture, blood (routine x 2)     Status: None (Preliminary result)   Collection Time: 05/04/20  9:26 AM   Specimen: BLOOD  Result Value Ref Range Status   Specimen Description BLOOD LEFT ANTECUBITAL  Final   Special Requests   Final    BOTTLES DRAWN AEROBIC AND ANAEROBIC Blood Culture adequate volume   Culture   Final    NO GROWTH 3 DAYS Performed at [facility], [address], [place], [place] [address]    Report Status PENDING  Incomplete  Culture, blood (routine x 2)     Status: None (Preliminary result)   Collection Time: 05/04/20  9:31 AM   Specimen: BLOOD LEFT HAND  Result Value Ref Range Status   Specimen Description BLOOD LEFT HAND  Final   Special Requests   Final    BOTTLES DRAWN AEROBIC AND ANAEROBIC Blood Culture adequate volume   Culture   Final    NO GROWTH 3 DAYS Performed at [facility], [place] [address] [place],  [address]    Report Status PENDING  Incomplete     Studies: No results  found.    Flora Lipps, MD  [facility] 05/08/2020

## 2020-05-09 DIAGNOSIS — E860 Dehydration: Secondary | ICD-10-CM | POA: Diagnosis not present

## 2020-05-09 DIAGNOSIS — E870 Hyperosmolality and hypernatremia: Secondary | ICD-10-CM | POA: Diagnosis not present

## 2020-05-09 DIAGNOSIS — I10 Essential (primary) hypertension: Secondary | ICD-10-CM | POA: Diagnosis not present

## 2020-05-09 DIAGNOSIS — G20 Parkinson's disease: Secondary | ICD-10-CM | POA: Diagnosis not present

## 2020-05-09 LAB — CULTURE, BLOOD (ROUTINE X 2)
Culture: NO GROWTH
Culture: NO GROWTH
Special Requests: ADEQUATE
Special Requests: ADEQUATE

## 2020-05-09 LAB — BASIC METABOLIC PANEL
Anion gap: 10 (ref 5–15)
BUN: 12 mg/dL (ref 8–23)
CO2: 17 mmol/L — ABNORMAL LOW (ref 22–32)
Calcium: 9.8 mg/dL (ref 8.9–10.3)
Chloride: 112 mmol/L — ABNORMAL HIGH (ref 98–111)
Creatinine, Ser: 0.78 mg/dL (ref 0.44–1.00)
GFR calc Af Amer: >60 mL/min (ref >60)
GFR calc non Af Amer: >60 mL/min (ref >60)
Glucose, Bld: 87 mg/dL (ref 70–99)
Potassium: 3.6 mmol/L (ref 3.5–5.1)
Sodium: 139 mmol/L (ref 135–145)

## 2020-05-09 LAB — CBC
HCT: 32.2 % — ABNORMAL LOW (ref 36.0–46.0)
Hemoglobin: 10.5 g/dL — ABNORMAL LOW (ref 12.0–15.0)
MCH: 26.6 pg (ref 26.0–34.0)
MCHC: 32.6 g/dL (ref 30.0–36.0)
MCV: 81.5 fL (ref 80.0–100.0)
Platelets: 208 10*3/uL (ref 150–400)
RBC: 3.95 MIL/uL (ref 3.87–5.11)
RDW: 19.7 % — ABNORMAL HIGH (ref 11.5–15.5)
WBC: 5.7 10*3/uL (ref 4.0–10.5)
nRBC: 0 % (ref 0.0–0.2)

## 2020-05-09 NOTE — Progress Notes (Signed)
PROGRESS NOTE  [name] ZHG:[id number] DOB: [birth year] DOA: 04/13/2020 PCP: Lujean Amel, MD   LOS: 26 days   Brief narrative: As per HPI,  64 year old with past medical history significant for dementia, Parkinson disease, hypertension, hypercalcemia, chronic diastolic heart failure presented to the hospital with worsening confusion, poor oral intake for 3 days prior to admission.  Patient stopped taking her medication 3 days prior to admission as well.  On arrival to the ED, patient was severely dehydrated with sodium 154, chloride 117, calcium 11.2.  CT head showed chronic cortical right parietal lobe infarct, no acute findings. Patient was admitted with a diagnosis of hypercalcemia and dehydration and metabolic encephalopathy.  Patient was a started on IV fluids, her electrolytes abnormality improved.  Patient became more alert and back to baseline however she remains intermittently confused due to her baseline dementia.  PT evaluated patient and is recommending a skilled nursing facility.  Her calcium level trended up again, PTH and vitamin D was noted to be normal,  SPEP normal, Ca improved and then has trended up again, given bisphosphonates and calcitonin, PTHrp was negative.  Currently awaiting placement, does not have long term care insurance.   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  Has improved at this time.  Sodium of 139 at this time. Off IV fluids.  Encouraged oral hydration.  Likelihood of hypernatremia if poor oral intake.  Acute metabolic encephalopathy:   Secondary to hypernatremia, severe hypercalcemia and dehydration.  Improved. Urinalysis, blood cultures were  negative. Extensive work-up for encephalopathy was negative.  Patient does have failure to thrive, dementia, parkinsonism.   Patient has been started on Unasyn since 05/04/2020.  We will complete 5-day course of antibiotic.     Suspected aspiration pneumonia   Secondary to metabolic encephalopathy.  Continue aspiration precautions.  Continue Unasyn to complete course.  Blood cultures negative in 4 days.  Currently on dysphagia 3 diet.  T-max of 9F.  Continue aspiration precautions.  Hypercalcemia likely worsened by dehydration. PTH normal, vitamin D level 34. SPEP without monoclonal protein. PTH rp <2.0.  Received 1 dose of Zometa on 8/6.     Will closely monitor.  Corrected calcium borderline high.  Encourage oral hydration.  Anorexia/Severe protein calorie malnutrition  -continue Ensure, Megace.  Very low albumin.   Essential hypertension: Continue with Coreg, ibersartan.      Parkinsons disease /dementia; Continue Sinemet, donepezil  Thoracic aortic aneurysm Incidentally noted on CT scanning, will need outpatient follow-up  Oral thrush; nystatin, improved.   Conjunctivitis; on Cipro eyedrops  Palliative care encounter Full code. Social worker following for long-term placement.  Plan is palliative care at the skilled nursing facility.  DVT prophylaxis: enoxaparin (LOVENOX) injection 40 mg Start: 04/25/20 1400   Code Status: Full code  Family Communication: None today.  Status is: Inpatient  Remains inpatient appropriate because:Unsafe DC plan, awaiting long-term placement.  Dispo:  Patient From: [facility]isted Living [facility]  Planned Disposition: [facility] with palliative care  Expected discharge date: 05/10/20  Medically stable for discharge: Yes  Consultants:  None   Procedures:  None  Antibiotics:  . Unasyn started 05/04/2020  Anti-infectives (From admission, onward)   Start     Dose/Rate Route Frequency Ordered  Stop   05/04/20 0845   ampicillin-sulbactam (UNASYN) 1.5 g in sodium chloride 0.9 % 100 mL IVPB     Discontinue     1.5 g 200 mL/hr over 30 Minutes Intravenous Every 6 hours 05/04/20 0838       Subjective: Today, patient was seen and examined at bedside.  Patient denies any nausea, vomiting, fever, chills or shortness of breath.  Objective: Vitals:   05/09/20 0600 05/09/20 0929  BP: (!) 175/94 (!) 165/98  Pulse: 64 69  Resp: 18 18  Temp: 98.2 F (36.8 C) 99 F (37.2 C)  SpO2: 96% 96%    Intake/Output Summary (Last 24 hours) at 05/09/2020 1002 Last data filed at 05/09/2020 0500 Gross per 24 hour  Intake 260 ml  Output 850 ml  Net -590 ml   Filed Weights   05/04/20 2108 05/05/20 2036 05/07/20 0447  Weight: 52.7 kg 54.2 kg 53.7 kg   Body mass index is 23.12 kg/m.   Physical Exam:  General: Patient is alert awake and communicative, frail chronically ill, not in obvious distress HENT:   No scleral pallor or icterus noted. Oral mucosa is moist.  Chest:   Diminished breath sounds bilaterally. CVS: S1 &S2 heard. No murmur.  Regular rate and rhythm.  CABG scar noted. Abdomen: Soft, nontender, nondistended.  Bowel sounds are heard.  External urinary catheter in place. Extremities: No cyanosis, clubbing or edema.  Peripheral pulses are palpable. Psych: Alert, awake and communicative, CNS:  No cranial nerve deficits.  Tremors noted.  Moving all extremities. Skin: Warm and dry.  No rashes noted.   Data Review: I have personally reviewed the following laboratory data and studies,  CBC: Recent Labs  Lab 05/05/20 0300 05/06/20 0442 05/07/20 0650 05/08/20 0451 05/09/20 0351  WBC 5.2 5.2 5.6 5.5 5.7  HGB 11.4* 10.9* 11.3* 10.8* 10.5*  HCT 35.7* 34.4* 35.1* 33.6* 32.2*  MCV 81.9 81.5 80.9 81.6 81.5  PLT 139* 164 182 193 163   Basic Metabolic Panel: Recent Labs  Lab 05/05/20 0300 05/06/20 0442 05/07/20 0650 05/08/20 0451 05/09/20 0351  NA 133* 135 136 137 139  K 4.2 3.8 3.7 3.9 3.6  CL  108 110 110 110 112*  CO2 15* 15* 18* 18* 17*  GLUCOSE 87 91 99 99 87  BUN 22 17 14 13 12   CREATININE 0.72 0.65 0.60 0.80 0.78  CALCIUM 9.5 9.5 9.9 9.7 9.8  MG  --  1.8 1.9  --   --   PHOS  --  1.4*  --   --   --    Liver Function Tests: Recent Labs  Lab 05/03/20 0203 05/05/20 0300 05/06/20 0442 05/07/20 0650 05/08/20 0451  AST 24 36 30 30 29   ALT 10 16 30 27 18   ALKPHOS 66 64 56 60 57  BILITOT 0.3 0.3 0.3 0.3 0.3  PROT 7.0 7.3 6.9 7.3 6.8  ALBUMIN 2.9* 2.9* 2.8* 2.8* 2.8*   No results for input(s): LIPASE, AMYLASE in the last 168 hours. No results for input(s): AMMONIA in the last 168 hours. Cardiac Enzymes: No results for input(s): CKTOTAL, CKMB, CKMBINDEX, TROPONINI in the last 168 hours. BNP (last 3 results) No results for input(s): BNP in the last 8760 hours.  ProBNP (last 3 results) No results for input(s): PROBNP in the last 8760 hours.  CBG: No results for input(s): GLUCAP in the last 168 hours. Recent Results (from the past 240 hour(s))  Culture, blood (routine x 2)     Status:  None (Preliminary result)   Collection Time: 05/04/20  9:26 AM   Specimen: BLOOD  Result Value Ref Range Status   Specimen Description BLOOD LEFT ANTECUBITAL  Final   Special Requests   Final    BOTTLES DRAWN AEROBIC AND ANAEROBIC Blood Culture adequate volume   Culture   Final    NO GROWTH 4 DAYS Performed at [facility], [address] [place], [place] [address]    Report Status PENDING  Incomplete  Culture, blood (routine x 2)     Status: None (Preliminary result)   Collection Time: 05/04/20  9:31 AM   Specimen: BLOOD LEFT HAND  Result Value Ref Range Status   Specimen Description BLOOD LEFT HAND  Final   Special Requests   Final    BOTTLES DRAWN AEROBIC AND ANAEROBIC Blood Culture adequate volume   Culture   Final    NO GROWTH 4 DAYS Performed at [facility], [place] [address], [place],  [address]    Report Status PENDING  Incomplete      Studies: No results found.    Flora Lipps, MD  [facility] 05/09/2020

## 2020-05-10 DIAGNOSIS — E860 Dehydration: Secondary | ICD-10-CM | POA: Diagnosis not present

## 2020-05-10 DIAGNOSIS — G20 Parkinson's disease: Secondary | ICD-10-CM | POA: Diagnosis not present

## 2020-05-10 DIAGNOSIS — E870 Hyperosmolality and hypernatremia: Secondary | ICD-10-CM | POA: Diagnosis not present

## 2020-05-10 DIAGNOSIS — I10 Essential (primary) hypertension: Secondary | ICD-10-CM | POA: Diagnosis not present

## 2020-05-10 LAB — BASIC METABOLIC PANEL
Anion gap: 9 (ref 5–15)
BUN: 11 mg/dL (ref 8–23)
CO2: 19 mmol/L — ABNORMAL LOW (ref 22–32)
Calcium: 10.1 mg/dL (ref 8.9–10.3)
Chloride: 111 mmol/L (ref 98–111)
Creatinine, Ser: 0.74 mg/dL (ref 0.44–1.00)
GFR calc Af Amer: >60 mL/min (ref >60)
GFR calc non Af Amer: >60 mL/min (ref >60)
Glucose, Bld: 98 mg/dL (ref 70–99)
Potassium: 3.7 mmol/L (ref 3.5–5.1)
Sodium: 139 mmol/L (ref 135–145)

## 2020-05-10 LAB — CBC
HCT: 32 % — ABNORMAL LOW (ref 36.0–46.0)
Hemoglobin: 10.3 g/dL — ABNORMAL LOW (ref 12.0–15.0)
MCH: 26.3 pg (ref 26.0–34.0)
MCHC: 32.2 g/dL (ref 30.0–36.0)
MCV: 81.6 fL (ref 80.0–100.0)
Platelets: 218 10*3/uL (ref 150–400)
RBC: 3.92 MIL/uL (ref 3.87–5.11)
RDW: 20.1 % — ABNORMAL HIGH (ref 11.5–15.5)
WBC: 6.5 10*3/uL (ref 4.0–10.5)
nRBC: 0 % (ref 0.0–0.2)

## 2020-05-10 LAB — GLUCOSE, CAPILLARY: Glucose-Capillary: 81 mg/dL (ref 70–99)

## 2020-05-10 NOTE — Progress Notes (Signed)
PROGRESS NOTE  [name] HRC:[id number] DOB: 08/07/1956 DOA: 04/13/2020 PCP: Lujean Amel, MD   LOS: 27 days   Brief narrative: As per HPI,  64 year old with past medical history significant for dementia, Parkinson disease, hypertension, hypercalcemia, chronic diastolic heart failure presented to the hospital with worsening confusion, poor oral intake for 3 days prior to admission.  Patient stopped taking her medication 3 days prior to admission as well.  On arrival to the ED, patient was severely dehydrated with sodium 154, chloride 117, calcium 11.2.  CT head showed chronic cortical right parietal lobe infarct, no acute findings. Patient was admitted with a diagnosis of hypercalcemia and dehydration and metabolic encephalopathy.  Patient was a started on IV fluids, her electrolytes abnormality improved.  Patient became more alert and back to baseline however she remains intermittently confused due to her baseline dementia.  PT evaluated patient and is recommending a skilled nursing facility.  Her calcium level trended up again, PTH and vitamin D was noted to be normal,  SPEP normal, Ca improved and then has trended up again, given bisphosphonates and calcitonin, PTHrp was negative.  Currently awaiting placement, does not have long term care insurance.   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  Has improved at this time.  Sodium of 139 at this time. Off IV fluids.  Encouraged oral hydration.   Acute metabolic encephalopathy:  Improved.  Secondary to hypernatremia, severe hypercalcemia and dehydration.  Urinalysis, blood cultures were negative. Extensive work-up for encephalopathy was  negative.  Patient does have failure to thrive, dementia, parkinsonism.   Patient has been started on Unasyn since 05/04/2020.  Completed 5-day course of antibiotic.     Suspected aspiration pneumonia   Secondary to metabolic encephalopathy.  Continue aspiration precautions.  Completed Unasyn to complete course.  Blood cultures negative in 5 days.  Currently on dysphagia 3 diet.  T-max of 56F.  Continue aspiration precautions.  Hypercalcemia likely worsened by dehydration. PTH normal, vitamin D level 34. SPEP without monoclonal protein. PTH rp <2.0.  Received 1 dose of Zometa on 8/6.     Will closely monitor.  Corrected calcium borderline high.  Encourage oral hydration.  Anorexia/Severe protein calorie malnutrition  -continue Ensure, Megace.  Very low albumin.   Essential hypertension: Continue with Coreg, ibersartan.      Parkinsons disease /dementia; Continue Sinemet, donepezil  Thoracic aortic aneurysm Incidentally noted on CT scanning, will need outpatient follow-up  Oral thrush; nystatin, improved.   Conjunctivitis; on Cipro eyedrops  Palliative care encounter Full code. Social worker following for long-term placement.  Plan is palliative care at the skilled nursing facility.  DVT prophylaxis: enoxaparin (LOVENOX) injection 40 mg Start: 04/25/20 1400   Code Status: Full code  Family Communication: None today.  Status is: Inpatient  Remains inpatient appropriate because:Unsafe DC plan, awaiting long-term placement.  Dispo:  [facility]  Planned Disposition: [place] with palliative care  Expected discharge date: 05/11/20  Medically stable for discharge: Yes  Consultants:  None   Procedures:  None  Antibiotics:  . Unasyn started 05/04/2020  Anti-infectives (From admission, onward)   Start     Dose/Rate Route Frequency Ordered Stop   05/04/20 0845  ampicillin-sulbactam (UNASYN) 1.5  g in sodium chloride 0.9  % 100 mL IVPB  Status:  Discontinued        1.5 g 200 mL/hr over 30 Minutes Intravenous Every 6 hours 05/04/20 0838 05/09/20 1140     Subjective: Today, patient was seen and examined at bedside.  Patient denies any nausea vomiting fever chills or rigor.  Complains of constipation.  Objective: Vitals:   05/10/20 0507 05/10/20 0851  BP: (!) 163/89 (!) 136/99  Pulse: 77 75  Resp: 18 12  Temp: 97.8 F (36.6 C) 97.7 F (36.5 C)  SpO2: 99% 99%    Intake/Output Summary (Last 24 hours) at 05/10/2020 1142 Last data filed at 05/10/2020 0936 Gross per 24 hour  Intake 837 ml  Output 1500 ml  Net -663 ml   Filed Weights   05/04/20 2108 05/05/20 2036 05/07/20 0447  Weight: 52.7 kg 54.2 kg 53.7 kg   Body mass index is 23.12 kg/m.   Physical Exam: General: Patient is alert awake and communicative, frail chronically ill, not in obvious distress HENT:   No scleral pallor or icterus noted. Oral mucosa is moist.  Chest:   Diminished breath sounds bilaterally. CVS: S1 &S2 heard. No murmur.  Regular rate and rhythm.  CABG scar noted. Abdomen: Soft, nontender, nondistended.  Bowel sounds are heard.  External urinary catheter in place. Extremities: No cyanosis, clubbing or edema.  Peripheral pulses are palpable. Psych: Alert, awake and communicative, CNS:  No cranial nerve deficits.  Tremors noted.  Moving all extremities. Skin: Warm and dry.  No rashes noted.   Data Review: I have personally reviewed the following laboratory data and studies,  CBC: Recent Labs  Lab 05/06/20 0442 05/07/20 0650 05/08/20 0451 05/09/20 0351 05/10/20 0322  WBC 5.2 5.6 5.5 5.7 6.5  HGB 10.9* 11.3* 10.8* 10.5* 10.3*  HCT 34.4* 35.1* 33.6* 32.2* 32.0*  MCV 81.5 80.9 81.6 81.5 81.6  PLT 164 182 193 208 222   Basic Metabolic Panel: Recent Labs  Lab 05/06/20 0442 05/07/20 0650 05/08/20 0451 05/09/20 0351 05/10/20 0322  NA 135 136 137 139 139  K 3.8 3.7 3.9 3.6 3.7  CL 110 110 110 112* 111  CO2  15* 18* 18* 17* 19*  GLUCOSE 91 99 99 87 98  BUN 17 14 13 12 11   CREATININE 0.65 0.60 0.80 0.78 0.74  CALCIUM 9.5 9.9 9.7 9.8 10.1  MG 1.8 1.9  --   --   --   PHOS 1.4*  --   --   --   --    Liver Function Tests: Recent Labs  Lab 05/05/20 0300 05/06/20 0442 05/07/20 0650 05/08/20 0451  AST 36 30 30 29   ALT 16 30 27 18   ALKPHOS 64 56 60 57  BILITOT 0.3 0.3 0.3 0.3  PROT 7.3 6.9 7.3 6.8  ALBUMIN 2.9* 2.8* 2.8* 2.8*   No results for input(s): LIPASE, AMYLASE in the last 168 hours. No results for input(s): AMMONIA in the last 168 hours. Cardiac Enzymes: No results for input(s): CKTOTAL, CKMB, CKMBINDEX, TROPONINI in the last 168 hours. BNP (last 3 results) No results for input(s): BNP in the last 8760 hours.  ProBNP (last 3 results) No results for input(s): PROBNP in the last 8760 hours.  CBG: No results for input(s): GLUCAP in the last 168 hours. Recent Results (from the past 240 hour(s))  Culture, blood (routine x 2)     Status: None   Collection Time: 05/04/20  9:26 AM   Specimen: BLOOD  Result Value Ref Range Status   Specimen Description BLOOD LEFT ANTECUBITAL  Final   Special Requests   Final    BOTTLES DRAWN AEROBIC AND ANAEROBIC Blood Culture adequate volume   Culture   Final    NO GROWTH 5 DAYS Performed at [facility], [address], [place], [place] [address]    Report Status 05/09/2020 FINAL  Final  Culture, blood (routine x 2)     Status: None   Collection Time: 05/04/20  9:31 AM   Specimen: BLOOD LEFT HAND  Result Value Ref Range Status   Specimen Description BLOOD LEFT HAND  Final   Special Requests   Final    BOTTLES DRAWN AEROBIC AND ANAEROBIC Blood Culture adequate volume   Culture   Final    NO GROWTH 5 DAYS Performed at [facility], [place] [address] [place], [place] [address]    Report Status 05/09/2020 FINAL  Final     Studies: No results found.    Flora Lipps, MD  [facility] 05/10/2020

## 2020-05-10 NOTE — Plan of Care (Signed)
  Problem: Nutrition: Goal: Adequate nutrition will be maintained Outcome: Not Progressing   

## 2020-05-11 LAB — PHOSPHORUS: Phosphorus: 2.3 mg/dL — ABNORMAL LOW (ref 2.5–4.6)

## 2020-05-11 LAB — COMPREHENSIVE METABOLIC PANEL
ALT: 28 U/L (ref 0–44)
AST: 20 U/L (ref 15–41)
Albumin: 2.8 g/dL — ABNORMAL LOW (ref 3.5–5.0)
Alkaline Phosphatase: 60 U/L (ref 38–126)
Anion gap: 9 (ref 5–15)
BUN: 15 mg/dL (ref 8–23)
CO2: 19 mmol/L — ABNORMAL LOW (ref 22–32)
Calcium: 10.5 mg/dL — ABNORMAL HIGH (ref 8.9–10.3)
Chloride: 109 mmol/L (ref 98–111)
Creatinine, Ser: 0.67 mg/dL (ref 0.44–1.00)
GFR calc Af Amer: >60 mL/min (ref >60)
GFR calc non Af Amer: >60 mL/min (ref >60)
Glucose, Bld: 96 mg/dL (ref 70–99)
Potassium: 4 mmol/L (ref 3.5–5.1)
Sodium: 137 mmol/L (ref 135–145)
Total Bilirubin: 0.4 mg/dL (ref 0.3–1.2)
Total Protein: 7.7 g/dL (ref 6.5–8.1)

## 2020-05-11 LAB — MAGNESIUM: Magnesium: 2 mg/dL (ref 1.7–2.4)

## 2020-05-11 LAB — CBC
HCT: 34.9 % — ABNORMAL LOW (ref 36.0–46.0)
Hemoglobin: 11.3 g/dL — ABNORMAL LOW (ref 12.0–15.0)
MCH: 26.1 pg (ref 26.0–34.0)
MCHC: 32.4 g/dL (ref 30.0–36.0)
MCV: 80.6 fL (ref 80.0–100.0)
Platelets: 233 10*3/uL (ref 150–400)
RBC: 4.33 MIL/uL (ref 3.87–5.11)
RDW: 20 % — ABNORMAL HIGH (ref 11.5–15.5)
WBC: 7.1 10*3/uL (ref 4.0–10.5)
nRBC: 0 % (ref 0.0–0.2)

## 2020-05-11 LAB — GLUCOSE, CAPILLARY: Glucose-Capillary: 98 mg/dL (ref 70–99)

## 2020-05-11 MED ORDER — BOOST / RESOURCE BREEZE PO LIQD CUSTOM
1.0000 | Freq: Two times a day (BID) | ORAL | Status: DC
  Administered 2020-05-11 – 2020-05-24 (×25): 1 via ORAL

## 2020-05-11 MED ORDER — POTASSIUM & SODIUM PHOSPHATES 280-160-250 MG PO PACK
1.0000 | PACK | Freq: Three times a day (TID) | ORAL | Status: DC
  Administered 2020-05-11 – 2020-05-18 (×30): 1 via ORAL
  Filled 2020-05-11 (×32): qty 1

## 2020-05-11 MED ORDER — ENSURE ENLIVE PO LIQD
237.0000 mL | Freq: Two times a day (BID) | ORAL | Status: DC
  Administered 2020-05-11 – 2020-05-23 (×27): 237 mL via ORAL

## 2020-05-11 NOTE — Progress Notes (Signed)
Nutrition Follow-up  DOCUMENTATION CODES:   Non-severe (moderate) malnutrition in context of chronic illness  INTERVENTION:  Decrease to Ensure Enlive po BID, each supplement provides 350 kcal and 20 grams of protein  Boost Breeze po BID, each supplement provides 250 kcal and 9 grams of protein  Continue Magic cup TID with meals, each supplement provides 290 kcal and 9 grams of protein   Continue MVI daily  NUTRITION DIAGNOSIS:   Moderate Malnutrition related to chronic illness (dementia) as evidenced by mild muscle depletion, mild fat depletion, moderate fat depletion, moderate muscle depletion, percent weight loss.  Ongoing  GOAL:   Patient will meet greater than or equal to 90% of their needs  Progressing  MONITOR:   PO intake, Supplement acceptance, Weight trends, Labs, I & O's  REASON FOR ASSESSMENT:   Consult Assessment of nutrition requirement/status  ASSESSMENT:   Pt admitted with severe dehydration and hypernatremia and acute encephalopathy. PMH includes advanced dementia, Parkinson disease, HTN, hypercalcemia, CHF.  Per MD, pt's metabolic encephalopathy is improved.   Pt denies N/V, but is complaining of constipation. No BM documented since 8/12. MD aware.   Pt drinking most Ensures. Given PO intake has somewhat improved, will decrease frequency of Ensure Enlive and provide more variety in supplements.  PO intake: 25% x last 8 recorded meals  Labs: Phosphorus 2.3 (L), Corrected Calcium 11.46 (H) Medications: Ensure Enlive QID,Megace, MVI, Miralax, Senokot-S  Diet Order:   Diet Order            DIET DYS 3 Room service appropriate? Yes; Fluid consistency: Thin  Diet effective now                 EDUCATION NEEDS:   No education needs have been identified at this time  Skin:  Skin Assessment: Skin Integrity Issues: Skin Integrity Issues:: Other (Comment) Other: MASD buttocks  Last BM:  8/8 type 4  Height:   Ht Readings from Last 1  Encounters:  05/01/20 5' (1.524 m)    Weight:   Wt Readings from Last 1 Encounters:  05/07/20 53.7 kg    BMI:  Body mass index is 23.12 kg/m.  Estimated Nutritional Needs:   Kcal:  1400-1600  Protein:  70-85 grams  Fluid:  >1.4L/d    Larkin Ina, MS, RD, LDN RD pager number and weekend/on-call pager number located in [place].

## 2020-05-11 NOTE — Progress Notes (Signed)
Physical Therapy Treatment Patient Details Name: [name] MRN: [id number] DOB: 1956/03/04 Today's Date: 05/11/2020    History of Present Illness [name] is a 64 y.o. female with medical history significant of advanced dementia, Parkinson disease, hypertension, hypercalcemia, chronic diastolic CHF, presented with worsening confusion and not eating or drinking x3 days.  Admitted with metabolic encephalopathy and hypernatremia.  Per TOC notes, pt unable to return to ALF due to level of assist, awaiting Medicaid for SNF placement.    PT Comments    Continuing work on functional mobility and activity tolerance;  Session focused on functional transfers, trunk and chest opening stretching; Used Stedy for transfer OOB to recliner, and needed +2 assist; Used vertically folded blanket at her spine to encourage chest opening, and optimal neck position (even if just briefly) in reclined position  Follow Up Recommendations  SNF     Equipment Recommendations  Hospital bed;Wheelchair (measurements PT)    Recommendations for Other Services       Precautions / Restrictions Precautions Precautions: Fall    Mobility  Bed Mobility Overal bed mobility: Needs Assistance Bed Mobility: Supine to Sit     Supine to sit: Max assist;+2 for physical assistance     General bed mobility comments: pt able to initiate small amount of BLE movement to reach EOB but inconsistently, able to reach for bed rail to pull or pull on therapist hand to raise trunk with increased time, cues and some hand-over hand assist. able to hold bed rail once hand is placed  Transfers Overall transfer level: Needs assistance Equipment used: Ambulation equipment used Transfers: Sit to/from Stand Sit to Stand: Total assist;+2 physical assistance         General transfer comment: Total assist of 2 persons to stand; heavy posterior lean, and noted tendency to keep knees and hips in relative extension when effort is  required; assist to stabilize feet; multimodal cues to bend at hips and knees for stand to sit  Ambulation/Gait                 Stairs             Wheelchair Mobility    Modified Rankin (Stroke Patients Only)       Balance     Sitting balance-Leahy Scale: Poor (approaching fair) Sitting balance - Comments: tending to lean posteriorly; min A and occasional minguard assist for balance EOB; perfomrmed chest opening stretches     Standing balance-Leahy Scale: Zero                              Cognition Arousal/Alertness: Awake/alert (fatigued quickly) Behavior During Therapy: Flat affect Overall Cognitive Status: Impaired/Different from baseline                                 General Comments: Pt more engaged and talkative during this session, but does continue to demo slowed processing and benefits from increased time for answering simple questions or repeated cues.      Exercises Other Exercises Other Exercises: BIl ankle PF stretch 30 sec    General Comments General comments (skin integrity, edema, etc.): Slower to answer questions when sitting up; VSS; worked on positioning for trunk and neck extension, as well as chest opening while sitting in recliner      Pertinent Vitals/Pain Pain Assessment: Faces Faces Pain Scale: Hurts a  little bit Pain Location: [place] with chest opening stretches Pain Intervention(s): Repositioned    Home Living                      Prior Function            PT Goals (current goals can now be found in the care plan section) Acute Rehab PT Goals Patient Stated Goal: did not state PT Goal Formulation: Patient unable to participate in goal setting Time For Goal Achievement: 05/14/20 Potential to Achieve Goals: Fair Progress towards PT goals: Progressing toward goals (Slowly)    Frequency    Min 2X/week      PT Plan Current plan remains appropriate    Co-evaluation               AM-PAC PT "6 Clicks" Mobility   Outcome Measure  Help needed turning from your back to your side while in a flat bed without using bedrails?: A Lot Help needed moving from lying on your back to sitting on the side of a flat bed without using bedrails?: A Lot Help needed moving to and from a bed to a chair (including a wheelchair)?: Total Help needed standing up from a chair using your arms (e.g., wheelchair or bedside chair)?: A Lot Help needed to walk in hospital room?: Total Help needed climbing 3-5 steps with a railing? : Total 6 Click Score: 9    End of Session Equipment Utilized During Treatment: Gait belt Activity Tolerance: Patient tolerated treatment well Patient left: in chair;with call bell/phone within reach;with chair alarm set Nurse Communication: Mobility status PT Visit Diagnosis: Unsteadiness on feet (R26.81);Other abnormalities of gait and mobility (R26.89);Muscle weakness (generalized) (M62.81);History of falling (Z91.81)     Time: 4403-4742 PT Time Calculation (min) (ACUTE ONLY): 31 min  Charges:  $Therapeutic Activity: 23-37 mins                     Roney Marion, [place]  Acute Rehabilitation Services Pager [phone] Office [place] 05/11/2020, 12:20 PM

## 2020-05-11 NOTE — Progress Notes (Signed)
PROGRESS NOTE  MARGARETTA CHITTUM NOM:[id number] DOB: Oct 03, 1955 DOA: 04/13/2020 PCP: Lujean Amel, MD   LOS: 28 days   Brief narrative: As per HPI,  64 year old with past medical history significant for dementia, Parkinson disease, hypertension, hypercalcemia, chronic diastolic heart failure presented to the hospital with worsening confusion, poor oral intake for 3 days prior to admission.  Patient had stopped taking her medication 3 days prior to admission.  On arrival to the ED, patient was severely dehydrated with sodium 154, chloride 117, calcium 11.2.  CT head showed chronic cortical right parietal lobe infarct, no acute findings. Patient was admitted with a diagnosis of hypercalcemia and dehydration and metabolic encephalopathy.  Patient was a started on IV fluids and her electrolytes abnormality improved.  Patient became more alert and back to baseline however she remains intermittently confused due to her baseline dementia.  PT evaluated patient and is recommending a skilled nursing facility.  PTH and vitamin D was noted to be normal,  SPEP normal, Ca itrended up again and was given bisphosphonates and calcitonin, PTHrp was negative.  Currently awaiting placement, does not have long term care insurance.   Assessment/Plan:  Active Problems:   Essential hypertension   Dementia due to Parkinson's disease without behavioral disturbance (HCC)   Dehydration, severe   Dehydration   Acute hypernatremia   Hypercalcemia   Malnutrition of moderate degree  Severe dehydration with hypernatremia: Resolved.  On the background of dementia and Parkinson's disease.  Off IV fluids.  Encouraged oral hydration.  High chance of recurrence of dehydration due to Parkinson's disease and poor oral intake.  Acute metabolic encephalopathy:   Secondary to hypernatremia, severe hypercalcemia and dehydration.  Improved. Urinalysis, blood cultures are negative. Extensive work-up for encephalopathy was negative.   Patient does have failure to thrive, dementia, parkinsonism.      Suspected aspiration pneumonia   Secondary to dementia, Parkinson's disease and metabolic encephalopathy.  Completed Unasyn course.  Blood cultures negative in 5 days.  Currently on dysphagia 3 diet.  T-max of 99.57F.  Continue aspiration precautions.  Hypercalcemia likely worsened by dehydration. PTH was normal, vitamin D level 34. SPEP without monoclonal protein. PTH rp <2.0.  Received 1 dose of Zometa on 8/6.     Will closely monitor.  Corrected calcium borderline high.  Encourage oral hydration to prevent hypercalcemia.  Patient is mostly bedbound at this time.  Mild hypophosphatemia.  Will replenish orally Neutra-Phos.  Check phosphate in 1 to 2 days.  Anorexia/Severe protein calorie malnutrition  -continue Ensure, Megace.  Very low albumin.   Essential hypertension: Continue with Coreg, ibersartan.      Parkinsons disease /dementia; Continue Sinemet, donepezil.  Continue symptomatic care.  Aspiration precaution.  Thoracic aortic aneurysm Incidentally noted on CT scanning, will need outpatient follow-up  Oral thrush; nystatin, improved.   Conjunctivitis; improved.  On Cipro eyedrops.  Will discontinue.  Palliative care encounter Full code. Social worker following for long-term placement.  Plan is palliative care at the skilled nursing facility.  DVT prophylaxis: enoxaparin (LOVENOX) injection 40 mg Start: 04/25/20 1400   Code Status: Full code  Family Communication:Spoke with the son [name] on the phone today and updated him about the clinical condition of the patient.  Status is: Inpatient  Remains inpatient appropriate because:Unsafe DC plan, awaiting long-term placement.  Dispo:  Patient From: [facility]  Planned Disposition: [facility] with palliative care.  Medically stable for disposition.  Expected discharge date: 05/11/20  Medically stable for discharge:  Yes  Consultants:  None   Procedures:   None  Antibiotics:  . Unasyn 05/04/2020>8/15  Anti-infectives (From admission, onward)   Start     Dose/Rate Route Frequency Ordered Stop   05/04/20 0845  ampicillin-sulbactam (UNASYN) 1.5 g in sodium chloride 0.9 % 100 mL IVPB  Status:  Discontinued        1.5 g 200 mL/hr over 30 Minutes Intravenous Every 6 hours 05/04/20 0838 05/09/20 1140     Subjective: Today, patient was seen and examined at bedside.  Denies any fever, shortness of breath, chest pain.  Objective: Vitals:   05/11/20 0527 05/11/20 0846  BP: (!) 172/97 139/86  Pulse: 65 68  Resp: 18 20  Temp: 99.1 F (37.3 C) 98.7 F (37.1 C)  SpO2: 96% 100%    Intake/Output Summary (Last 24 hours) at 05/11/2020 1301 Last data filed at 05/11/2020 1240 Gross per 24 hour  Intake 837 ml  Output 1375 ml  Net -538 ml   Filed Weights   05/04/20 2108 05/05/20 2036 05/07/20 0447  Weight: 52.7 kg 54.2 kg 53.7 kg   Body mass index is 23.12 kg/m.   Physical Exam:  General: Patient is alert awake and communicative, frail chronically ill, not in obvious distress, slow to respond. HENT:   No scleral pallor or icterus noted. Oral mucosa is moist.  Chest:   Diminished breath sounds bilaterally.  No obvious wheezes or crackles noted CVS: S1 &S2 heard. No murmur.  Regular rate and rhythm.  CABG scar noted. Abdomen: Soft, nontender, nondistended.  Bowel sounds are heard.  External urinary catheter in place. Extremities: No cyanosis, clubbing or edema.  Peripheral pulses are palpable. Psych: Alert, awake and communicative, CNS:  No cranial nerve deficits.  Tremors noted.  Moving all extremities. Skin: Warm and dry.  No rashes noted.   Data Review: I have personally reviewed the following laboratory data and studies,  CBC: Recent Labs  Lab 05/07/20 0650 05/08/20 0451 05/09/20 0351 05/10/20 0322 05/11/20 0627  WBC 5.6 5.5 5.7 6.5 7.1  HGB 11.3* 10.8* 10.5* 10.3* 11.3*  HCT  35.1* 33.6* 32.2* 32.0* 34.9*  MCV 80.9 81.6 81.5 81.6 80.6  PLT 182 193 208 218 824   Basic Metabolic Panel: Recent Labs  Lab 05/06/20 0442 05/06/20 0442 05/07/20 0650 05/08/20 0451 05/09/20 0351 05/10/20 0322 05/11/20 0627  NA 135   < > 136 137 139 139 137  K 3.8   < > 3.7 3.9 3.6 3.7 4.0  CL 110   < > 110 110 112* 111 109  CO2 15*   < > 18* 18* 17* 19* 19*  GLUCOSE 91   < > 99 99 87 98 96  BUN 17   < > 14 13 12 11 15   CREATININE 0.65   < > 0.60 0.80 0.78 0.74 0.67  CALCIUM 9.5   < > 9.9 9.7 9.8 10.1 10.5*  MG 1.8  --  1.9  --   --   --  2.0  PHOS 1.4*  --   --   --   --   --  2.3*   < > = values in this interval not displayed.   Liver Function Tests: Recent Labs  Lab 05/05/20 0300 05/06/20 0442 05/07/20 0650 05/08/20 0451 05/11/20 0627  AST 36 30 30 29 20   ALT 16 30 27 18 28   ALKPHOS 64 56 60 57 60  BILITOT 0.3 0.3 0.3 0.3 0.4  PROT 7.3 6.9 7.3 6.8 7.7  ALBUMIN 2.9*  2.8* 2.8* 2.8* 2.8*   No results for input(s): LIPASE, AMYLASE in the last 168 hours. No results for input(s): AMMONIA in the last 168 hours. Cardiac Enzymes: No results for input(s): CKTOTAL, CKMB, CKMBINDEX, TROPONINI in the last 168 hours. BNP (last 3 results) No results for input(s): BNP in the last 8760 hours.  ProBNP (last 3 results) No results for input(s): PROBNP in the last 8760 hours.  CBG: Recent Labs  Lab 05/10/20 2055 05/11/20 0642  GLUCAP 81 98   Recent Results (from the past 240 hour(s))  Culture, blood (routine x 2)     Status: None   Collection Time: 05/04/20  9:26 AM   Specimen: BLOOD  Result Value Ref Range Status   Specimen Description BLOOD LEFT ANTECUBITAL  Final   Special Requests   Final    BOTTLES DRAWN AEROBIC AND ANAEROBIC Blood Culture adequate volume   Culture   Final    NO GROWTH 5 DAYS Performed at [facility], [address], [place], [place] [address]    Report Status 05/09/2020 FINAL  Final  Culture, blood (routine x 2)     Status: None    Collection Time: 05/04/20  9:31 AM   Specimen: BLOOD LEFT HAND  Result Value Ref Range Status   Specimen Description BLOOD LEFT HAND  Final   Special Requests   Final    BOTTLES DRAWN AEROBIC AND ANAEROBIC Blood Culture adequate volume   Culture   Final    NO GROWTH 5 DAYS Performed at [facility], [name] [address] [place], [place] [address]    Report Status 05/09/2020 FINAL  Final     Studies: No results found.    Flora Lipps, MD  [facility] 05/11/2020

## 2020-05-12 LAB — COMPREHENSIVE METABOLIC PANEL
ALT: 29 U/L (ref 0–44)
AST: 24 U/L (ref 15–41)
Albumin: 2.8 g/dL — ABNORMAL LOW (ref 3.5–5.0)
Alkaline Phosphatase: 53 U/L (ref 38–126)
Anion gap: 9 (ref 5–15)
BUN: 16 mg/dL (ref 8–23)
CO2: 20 mmol/L — ABNORMAL LOW (ref 22–32)
Calcium: 10.2 mg/dL (ref 8.9–10.3)
Chloride: 109 mmol/L (ref 98–111)
Creatinine, Ser: 0.69 mg/dL (ref 0.44–1.00)
GFR calc Af Amer: >60 mL/min (ref >60)
GFR calc non Af Amer: >60 mL/min (ref >60)
Glucose, Bld: 94 mg/dL (ref 70–99)
Potassium: 4 mmol/L (ref 3.5–5.1)
Sodium: 138 mmol/L (ref 135–145)
Total Bilirubin: 0.3 mg/dL (ref 0.3–1.2)
Total Protein: 7.6 g/dL (ref 6.5–8.1)

## 2020-05-12 LAB — PHOSPHORUS: Phosphorus: 2.6 mg/dL (ref 2.5–4.6)

## 2020-05-12 NOTE — Plan of Care (Signed)
  Problem: Nutrition: Goal: Adequate nutrition will be maintained Outcome: Progressing   

## 2020-05-12 NOTE — Progress Notes (Signed)
PROGRESS NOTE  [name] NWG:[id number] DOB: 1955/11/10 DOA: 04/13/2020 PCP: Lujean Amel, MD   LOS: 29 days   Brief narrative:  64 year old with past medical history significant for dementia, Parkinson disease, hypertension, hypercalcemia, chronic diastolic heart failure presented to the hospital with worsening confusion, poor oral intake for 3 days prior to admission.  Patient had stopped taking her medication 3 days prior to admission.  On arrival to the ED, patient was severely dehydrated with sodium 154, chloride 117, calcium 11.2, corrected calcium greater than 12.5, severe hypoalbuminemia.  CT head showed chronic cortical right parietal lobe infarct, no acute findings. Patient was admitted with a diagnosis of hypercalcemia and dehydration and metabolic encephalopathy, severe malnutrition and failure to thrive.  Patient was started on IV fluids and her electrolytes improved.  Patient became more alert and back to baseline however she remains intermittently confused due to her baseline dementia.  PT evaluated patient and is recommending a skilled nursing facility.  PTH and vitamin D was noted to be normal,  SPEP normal, Ca trended up again and was given bisphosphonates and calcitonin, PTHrp was negative.  Currently awaiting placement, does not have long term care insurance.   Assessment/Plan:  Severe dehydration with hypernatremia: Resolved.  On the background of dementia and Parkinson's disease.  Off IV fluids.  Encouraged oral hydration.  High chance of recurrence of dehydration due to Parkinson's disease and poor oral intake.  Acute metabolic encephalopathy:   Secondary to hypernatremia, severe hypercalcemia and dehydration.  Improved. Urinalysis, blood cultures are negative. Extensive work-up for encephalopathy was negative.  Patient does have failure to thrive, dementia, parkinsonism.      Suspected aspiration pneumonia   Secondary to dementia, Parkinson's disease and metabolic  encephalopathy.  Completed Unasyn course.  Blood cultures negative in 5 days.  Currently on dysphagia 3 diet.  T-max of 99.24F.  Continue aspiration precautions.  Hypercalcemia likely worsened by dehydration. PTH was normal, vitamin D level 34. SPEP without monoclonal protein. PTH rp <2.0.  Received 1 dose of Zometa on 8/6.     Will closely monitor.  Corrected calcium borderline high.  Encourage oral hydration to prevent hypercalcemia.  Patient is mostly bedbound at this time.  Mild hypophosphatemia.   -Replaced  Anorexia/Severe protein calorie malnutrition  -continue Ensure, Megace.  Very low albumin.   Essential hypertension: Continue with Coreg, ibersartan.      Parkinsons disease /dementia; Continue Sinemet, donepezil.   Aspiration precaution.  Thoracic aortic aneurysm Incidentally noted on CT scanning, will need outpatient follow-up  Oral thrush; nystatin, improved.   Conjunctivitis; improved.  On Cipro eyedrops.  Will discontinue.  Palliative care encounter Prognosis is poor, discussed with patient on multiple occasions, continues to wish for full code and full scope of treatment at this time  -Recommend palliative care follow-up at SNF   DVT prophylaxis: enoxaparin (LOVENOX) injection 40 mg Start: 04/25/20 1400   Code Status: Full code Family Communication: Discussed with patient, no family at bedside  Status is: Inpatient  Remains inpatient appropriate because:Unsafe DC plan, awaiting long-term placement.  Dispo:  [facility]  Planned Disposition: [facility] with palliative care.  Medically stable for disposition.  Expected discharge date: 05/11/20  Medically stable for discharge: Yes  Consultants:  None   Procedures:   None  Antibiotics:  . Unasyn 05/04/2020>8/15  Anti-infectives (From admission, onward)   Start     Dose/Rate Route Frequency Ordered Stop   05/04/20 0845  ampicillin-sulbactam (UNASYN)  1.5 g  in sodium chloride 0.9 % 100 mL IVPB  Status:  Discontinued        1.5 g 200 mL/hr over 30 Minutes Intravenous Every 6 hours 05/04/20 0838 05/09/20 1140     Subjective: No events overnight, stable, oral intake is still poor  Objective: Vitals:   05/12/20 0505 05/12/20 0906  BP: (!) 156/83 127/83  Pulse: 61 66  Resp: 17 18  Temp: 98.1 F (36.7 C) 98.2 F (36.8 C)  SpO2: 99% 99%    Intake/Output Summary (Last 24 hours) at 05/12/2020 1143 Last data filed at 05/12/2020 0746 Gross per 24 hour  Intake 480 ml  Output 400 ml  Net 80 ml   Filed Weights   05/05/20 2036 05/07/20 0447 05/12/20 0505  Weight: 54.2 kg 53.7 kg 55.5 kg   Body mass index is 23.9 kg/m.   Physical Exam:  General: Pleasant chronically ill extremely cachectic frail female sitting up in bed, slow to respond, oriented to self and place HEENT: No JVD CVS: S1-S2, regular rate rhythm Lungs: Decreased breath sounds bilaterally Abdomen: Soft, nontender, bowel sounds present Extremities: No edema Psych: Flat affect CNS: Moves all extremities, no localizing signs   Data Review: I have personally reviewed the following laboratory data and studies,  CBC: Recent Labs  Lab 05/07/20 0650 05/08/20 0451 05/09/20 0351 05/10/20 0322 05/11/20 0627  WBC 5.6 5.5 5.7 6.5 7.1  HGB 11.3* 10.8* 10.5* 10.3* 11.3*  HCT 35.1* 33.6* 32.2* 32.0* 34.9*  MCV 80.9 81.6 81.5 81.6 80.6  PLT 182 193 208 218 161   Basic Metabolic Panel: Recent Labs  Lab 05/06/20 0442 05/06/20 0442 05/07/20 0650 05/07/20 0650 05/08/20 0451 05/09/20 0351 05/10/20 0322 05/11/20 0627 05/12/20 0720  NA 135   < > 136   < > 137 139 139 137 138  K 3.8   < > 3.7   < > 3.9 3.6 3.7 4.0 4.0  CL 110   < > 110   < > 110 112* 111 109 109  CO2 15*   < > 18*   < > 18* 17* 19* 19* 20*  GLUCOSE 91   < > 99   < > 99 87 98 96 94  BUN 17   < > 14   < > 13 12 11 15 16   CREATININE 0.65   < > 0.60   < > 0.80 0.78 0.74 0.67 0.69  CALCIUM 9.5   < >  9.9   < > 9.7 9.8 10.1 10.5* 10.2  MG 1.8  --  1.9  --   --   --   --  2.0  --   PHOS 1.4*  --   --   --   --   --   --  2.3* 2.6   < > = values in this interval not displayed.   Liver Function Tests: Recent Labs  Lab 05/06/20 0442 05/07/20 0650 05/08/20 0451 05/11/20 0627 05/12/20 0720  AST 30 30 29 20 24   ALT 30 27 18 28 29   ALKPHOS 56 60 57 60 53  BILITOT 0.3 0.3 0.3 0.4 0.3  PROT 6.9 7.3 6.8 7.7 7.6  ALBUMIN 2.8* 2.8* 2.8* 2.8* 2.8*   No results for input(s): LIPASE, AMYLASE in the last 168 hours. No results for input(s): AMMONIA in the last 168 hours. Cardiac Enzymes: No results for input(s): CKTOTAL, CKMB, CKMBINDEX, TROPONINI in the last 168 hours. BNP (last 3 results) No results for input(s): BNP in the last 8760  hours.  ProBNP (last 3 results) No results for input(s): PROBNP in the last 8760 hours.  CBG: Recent Labs  Lab 05/10/20 2055 05/11/20 0642  GLUCAP 81 98   Recent Results (from the past 240 hour(s))  Culture, blood (routine x 2)     Status: None   Collection Time: 05/04/20  9:26 AM   Specimen: BLOOD  Result Value Ref Range Status   Specimen Description BLOOD LEFT ANTECUBITAL  Final   Special Requests   Final    BOTTLES DRAWN AEROBIC AND ANAEROBIC Blood Culture adequate volume   Culture   Final    NO GROWTH 5 DAYS Performed at [facility], [address] [place], [place] [address]    Report Status 05/09/2020 FINAL  Final  Culture, blood (routine x 2)     Status: None   Collection Time: 05/04/20  9:31 AM   Specimen: BLOOD LEFT HAND  Result Value Ref Range Status   Specimen Description BLOOD LEFT HAND  Final   Special Requests   Final    BOTTLES DRAWN AEROBIC AND ANAEROBIC Blood Culture adequate volume   Culture   Final    NO GROWTH 5 DAYS Performed at [facility], [place] [address] [place], [place] [address]    Report Status 05/09/2020 FINAL  Final     Studies: No results found.    Domenic Polite, MD  [facility] 05/12/2020

## 2020-05-12 NOTE — Clinical Social Work Note (Signed)
Secure e-mail sent to Adult Medicaid worker Cherene Altes regardng patient's Medicaid application and anticipated processing time. CSW will continue to follow.  Hildreth Orsak Givens, MSW, LCSW Licensed Clinical Social Worker [place] [phone]

## 2020-05-13 NOTE — Plan of Care (Signed)
  Problem: Safety: Goal: Ability to remain free from injury will improve Outcome: Progressing   

## 2020-05-13 NOTE — Progress Notes (Signed)
No changes Pt seen and examined, continue to await Medicaid for Long term placement  Domenic Polite, MD

## 2020-05-13 NOTE — Progress Notes (Signed)
Physical Therapy Treatment Patient Details Name: [name] MRN: [id number] DOB: [birth year] Today's Date: 05/13/2020    History of Present Illness [name] is a 64 y.o. female with medical history significant of advanced dementia, Parkinson disease, hypertension, hypercalcemia, chronic diastolic CHF, presented with worsening confusion and not eating or drinking x3 days.  Admitted with metabolic encephalopathy and hypernatremia.  Per TOC notes, pt unable to return to ALF due to level of assist, awaiting Medicaid for SNF placement.    PT Comments    The pt is continuing to make slow progress with therapy at this time. She was able to complete bed mobility with improved activation and initiation in her BLE and RUE, but continues to benefit from mod/maxA to complete the transfers and mobility. She also continues to benefit from minA to minG to maintain stability sitting EOB and is unable to maintain upright position without BUE support for more than a few seconds. The pt will continue to benefit from skilled PT to progress functional ROM and strength for transfers with reduced caregiver burden.     Follow Up Recommendations  SNF     Equipment Recommendations  Hospital bed;Wheelchair (measurements PT)    Recommendations for Other Services       Precautions / Restrictions Precautions Precautions: Fall Restrictions Weight Bearing Restrictions: No    Mobility  Bed Mobility Overal bed mobility: Needs Assistance Bed Mobility: Supine to Sit     Supine to sit: Max assist;HOB elevated     General bed mobility comments: pt able to initiate some LE movement, but benefits from mod/maxA to complete movement and maintain position of BLE at EOB (pt tends to pull legs back into bed). The pt then requires assist from therapist to reach for bed rail and raise trunk from an elevated HOB. modA to maintain seated balance in sitting  Transfers Overall transfer level: Needs  assistance Equipment used: Ambulation equipment used Transfers: Sit to/from [facility] Sit to Stand: Total assist;+2 physical assistance Stand pivot transfers: Total assist;+2 physical assistance       General transfer comment: TotalA of 2 to power up and to maintain upright in steady. Heavy posterior lean with poor hip extension. Significantly rounded shoulders and flexed trunk, unable to shift wt to bring forward withtout significant assist.  Ambulation/Gait Ambulation/Gait assistance:  (pt unable)           General Gait Details: unable       Balance Overall balance assessment: Needs assistance Sitting-balance support: Feet supported;Bilateral upper extremity supported;No upper extremity supported Sitting balance-Leahy Scale: Poor Sitting balance - Comments: sits with rounded back and shouldersdespite cues for hip flexion, minG for stability, pt reaching for BUE support at all times but can maintain momentarily without UE support Postural control: Posterior lean Standing balance support: Bilateral upper extremity supported Standing balance-Leahy Scale: Zero Standing balance comment: Total A x 2 to maintain standing balance in [place]. Unable to hold self up                            Cognition Arousal/Alertness: Awake/alert (but fatigues quickly) Behavior During Therapy: Flat affect Overall Cognitive Status: Impaired/Different from baseline Area of Impairment: Attention;Memory;Following commands;Safety/judgement;Awareness;Problem solving                   Current Attention Level: Sustained Memory: Decreased short-term memory Following Commands: Follows one step commands inconsistently;Follows one step commands with increased time Safety/Judgement: Decreased awareness of  safety;Decreased awareness of deficits   Problem Solving: Slow processing;Decreased initiation;Difficulty sequencing;Requires verbal cues;Requires tactile cues General  Comments: The pt was initially more engaged, answering questions more quickly and even asked a question. however, she fatigues quickly which slows responses and generally decreases engagement. The pt benefits from cues and increased time for processing throughout      Exercises General Exercises - Lower Extremity Ankle Circles/Pumps: AROM;Both;10 reps;Supine Heel Slides: AAROM;Both;10 reps;Supine Straight Leg Raises: AAROM;Both;10 reps;Supine    General Comments General comments (skin integrity, edema, etc.): Slower to answer questions with fatigue (in sitting and standing), VSS throughout.      Pertinent Vitals/Pain Pain Assessment: Faces Faces Pain Scale: Hurts a little bit Pain Location: grimace with stance Pain Descriptors / Indicators: Grimacing Pain Intervention(s): Monitored during session;Repositioned           PT Goals (current goals can now be found in the care plan section) Acute Rehab PT Goals Patient Stated Goal: did not state PT Goal Formulation: Patient unable to participate in goal setting Time For Goal Achievement: 05/27/20 Potential to Achieve Goals: Fair Progress towards PT goals: Progressing toward goals (slowly)    Frequency    Min 2X/week      PT Plan Current plan remains appropriate       AM-PAC PT "6 Clicks" Mobility   Outcome Measure  Help needed turning from your back to your side while in a flat bed without using bedrails?: A Lot Help needed moving from lying on your back to sitting on the side of a flat bed without using bedrails?: A Lot Help needed moving to and from a bed to a chair (including a wheelchair)?: Total Help needed standing up from a chair using your arms (e.g., wheelchair or bedside chair)?: A Lot Help needed to walk in hospital room?: Total Help needed climbing 3-5 steps with a railing? : Total 6 Click Score: 9    End of Session Equipment Utilized During Treatment: Gait belt Activity Tolerance: Patient tolerated  treatment well Patient left: in chair;with call bell/phone within reach;with chair alarm set Nurse Communication: Mobility status PT Visit Diagnosis: Unsteadiness on feet (R26.81);Other abnormalities of gait and mobility (R26.89);Muscle weakness (generalized) (M62.81);History of falling (Z91.81)     Time: 7014-1030 PT Time Calculation (min) (ACUTE ONLY): 30 min  Charges:  $Therapeutic Exercise: 8-22 mins $Therapeutic Activity: 8-22 mins                     Karma Ganja, PT, DPT   Acute Rehabilitation Department Pager #: [phone]   Otho Bellows 05/13/2020, 2:44 PM

## 2020-05-14 NOTE — Progress Notes (Signed)
Occupational Therapy Treatment & Discharge Patient Details Name: LACRESHIA BONDARENKO MRN: [id number] DOB: 12-04-1955 Today's Date: 05/14/2020    History of present illness JAHLIA OMURA is a 64 y.o. female with medical history significant of advanced dementia, Parkinson disease, hypertension, hypercalcemia, chronic diastolic CHF, presented with worsening confusion and not eating or drinking x3 days.  Admitted with metabolic encephalopathy and hypernatremia.  Per TOC notes, pt unable to return to ALF due to level of assist, awaiting Medicaid for SNF placement.   OT comments  Pt with minimal progress towards OT goals. Pt continues to require Total A x 2 for transfers via Stedy, Total A for UB/LB ADLs due to deficits. Assessed self-feeding with NT and overall Min A still required for self feeding tasks due to slow motor planning, difficulty sequencing, and signs of failure to thrive (poor appetite). Pt unable to follow instructions well for cervical HEP and with dementia, poor carryover of HEP instruction expected. Based on plateau of functional abilities, OT to discharge skilled OT services at acute level. Defer all OT needs to SNF. Please re-consult if needs change.    Follow Up Recommendations  SNF;Supervision/Assistance - 24 hour    Equipment Recommendations  Hospital bed    Recommendations for Other Services      Precautions / Restrictions Precautions Precautions: Fall Restrictions Weight Bearing Restrictions: No       Mobility Bed Mobility                  Transfers                      Balance                                           ADL either performed or assessed with clinical judgement   ADL Overall ADL's : Needs assistance/impaired Eating/Feeding: Minimal assistance;Bed level Eating/Feeding Details (indicate cue type and reason): NT with pt assisting with breakfast. Pt overall Min A for self feeding to drink from cup. Initially Mod A  for gathering food on fork, but then demo ability to reach to plate and stab food with fork, bring 50-75% to mouth and require HOH to complete task. Pt with resting tremors today. Suspect pt with need a minimum of supervision for all feeding tasks due to cognition and difficulty sequencing                                         Vision   Vision Assessment?: No apparent visual deficits   Perception     Praxis      Cognition Arousal/Alertness: Awake/alert Behavior During Therapy: Flat affect Overall Cognitive Status: Impaired/Different from baseline Area of Impairment: Attention;Memory;Following commands;Safety/judgement;Awareness;Problem solving                     Memory: Decreased short-term memory Following Commands: Follows one step commands inconsistently;Follows one step commands with increased time Safety/Judgement: Decreased awareness of safety;Decreased awareness of deficits Awareness: Intellectual Problem Solving: Slow processing;Decreased initiation;Difficulty sequencing;Requires verbal cues;Requires tactile cues General Comments: Pt echoing what staff were saying at times. Pt flat affect, but typically answers OT questions/comments. Pt with slow processing of instructions and slow motor planning        Exercises  Shoulder Instructions       General Comments Collaborated with NT on pt's current functional abilities. Per PT notes, pt continues to require Total A x 2 with use of Stedy, minimal progress towards goals. Planned DC based on current functional abilities    Pertinent Vitals/ Pain       Pain Assessment: Faces Faces Pain Scale: No hurt  Home Living                                          Prior Functioning/Environment              Frequency  Min 1X/week        Progress Toward Goals  OT Goals(current goals can now be found in the care plan section)  Progress towards OT goals: Not progressing  toward goals - comment (discharge)  Acute Rehab OT Goals Patient Stated Goal: did not state OT Goal Formulation: With patient Time For Goal Achievement: 05/12/20 Potential to Achieve Goals: Poor ADL Goals Pt Will Perform Eating: with set-up;sitting Pt Will Perform Grooming: with min assist;sitting Pt Will Perform Upper Body Bathing: with mod assist;sitting Additional ADL Goal #1: Pt to demonstrate ability to sit EOB > 5 minutes with no more than Supervision required during ADL tasks to improve core strength Additional ADL Goal #2: Pt to demonstrate active cervical ROM HEP with no more than min verbal/tactile cues to improve posture and comfort  Plan Discharge plan remains appropriate;Other (comment) (Planned DC based on minimal progress)    Co-evaluation                 AM-PAC OT "6 Clicks" Daily Activity     Outcome Measure   Help from another person eating meals?: A Little Help from another person taking care of personal grooming?: A Lot Help from another person toileting, which includes using toliet, bedpan, or urinal?: Total Help from another person bathing (including washing, rinsing, drying)?: Total Help from another person to put on and taking off regular upper body clothing?: A Lot Help from another person to put on and taking off regular lower body clothing?: Total 6 Click Score: 10    End of Session    OT Visit Diagnosis: Unsteadiness on feet (R26.81);Other abnormalities of gait and mobility (R26.89);Muscle weakness (generalized) (M62.81);Other symptoms and signs involving cognitive function   Activity Tolerance Patient limited by fatigue   Patient Left in bed;with nursing/sitter in room   Nurse Communication Mobility status        Time: 3614-4315 OT Time Calculation (min): 17 min  Charges: OT General Charges $OT Visit: 1 Visit OT Treatments $Self Care/Home Management : 8-22 mins  Layla Maw, OTR/L   Layla Maw 05/14/2020, 8:07 AM

## 2020-05-14 NOTE — Plan of Care (Signed)
°  Problem: Nutrition: °Goal: Adequate nutrition will be maintained °Outcome: Progressing °  °Problem: Coping: °Goal: Level of anxiety will decrease °Outcome: Progressing °  °Problem: Safety: °Goal: Ability to remain free from injury will improve °Outcome: Progressing °  °

## 2020-05-14 NOTE — Progress Notes (Addendum)
PROGRESS NOTE  [name] RWE:[id number] DOB: 1956/01/18 DOA: 04/13/2020 PCP: Lujean Amel, MD   LOS: 31 days   Brief narrative:  64 year old with past medical history significant for dementia, Parkinson disease, hypertension, hypercalcemia, chronic diastolic heart failure presented to the hospital with worsening confusion, poor oral intake for 3 days prior to admission.  Patient had stopped taking her medication 3 days prior to admission.  On arrival to the ED, patient was severely dehydrated with sodium 154, chloride 117, calcium 11.2, corrected calcium greater than 12.5, severe hypoalbuminemia.  CT head showed chronic cortical right parietal lobe infarct, no acute findings. Patient was admitted with a diagnosis of hypercalcemia and dehydration and metabolic encephalopathy, severe malnutrition and failure to thrive.  Patient was started on IV fluids and her electrolytes improved.  Patient became more alert and back to baseline however she remains intermittently confused due to her baseline dementia.  PT evaluated patient and is recommending a skilled nursing facility.  PTH and vitamin D was noted to be normal,  SPEP normal, Ca trended up again and was given bisphosphonates and calcitonin, PTHrp was negative.  Currently awaiting placement, does not have long term care insurance.   Assessment/Plan:  Severe dehydration with hypernatremia: Adult failure to thrive  Resolved.  On the background of dementia and Parkinson's disease.  Off IV fluids.  Encouraged oral hydration.  High chance of recurrence of dehydration due to Parkinson's disease and poor oral intake. -poor long term prognosis, pt continues to wish for full scope of Rx and family wants Rehab  Acute metabolic encephalopathy:   Secondary to hypernatremia, severe hypercalcemia and dehydration.  Improved. Urinalysis, blood cultures are negative. Extensive work-up for encephalopathy was negative.   -she does have failure to thrive,  dementia, parkinsonism.      Suspected aspiration pneumonia   Secondary to dementia, Parkinson's disease and metabolic encephalopathy.  Completed Unasyn course.  Blood cultures negative in 5 days.   -  Currently on dysphagia 3 diet.  -  Continue aspiration precautions.  Hypercalcemia likely worsened by dehydration. PTH was normal, vitamin D level 34. SPEP without monoclonal protein. PTH rp <2.0.  Received 1 dose of Zometa on 8/6.  -Corrected calcium borderline high.  - encourage oral hydration  - Patient is mostly bedbound at this time.  Mild hypophosphatemia.   -Replaced  Anorexia/Severe protein calorie malnutrition  -continue Ensure, Megace.  Very low albumin.   Essential hypertension: Continue with Coreg, ibersartan.      Parkinsons disease /dementia; Continue Sinemet, donepezil.   Aspiration precaution.  Thoracic aortic aneurysm Incidentally noted on CT scanning, will need outpatient follow-up  Oral thrush; nystatin, improved.   Conjunctivitis; improved.  On Cipro eyedrops.  Will discontinue.  Palliative care encounter Prognosis is poor, discussed with patient on multiple occasions, continues to wish for full code and full scope of treatment at this time  -Recommend palliative care follow-up at SNF   DVT prophylaxis: enoxaparin (LOVENOX) injection 40 mg Start: 04/25/20 1400   Code Status: Full code Family Communication: Discussed with patient, no family at bedside  Status is: Inpatient  Remains inpatient appropriate because:Unsafe DC plan, awaiting long-term placement.  Dispo:  [facility]  Planned Disposition: [employer] with palliative care.  Medically stable for disposition.  Expected discharge date: unknown  Medically stable for discharge: Yes  Consultants:  None   Procedures:   None  Antibiotics:  . Unasyn 05/04/2020>8/15  Anti-infectives (From admission, onward)   Start  Dose/Rate Route  Frequency Ordered Stop   05/04/20 0845  ampicillin-sulbactam (UNASYN) 1.5 g in sodium chloride 0.9 % 100 mL IVPB  Status:  Discontinued        1.5 g 200 mL/hr over 30 Minutes Intravenous Every 6 hours 05/04/20 0838 05/09/20 1140     Subjective: No events overnight, stable, oral intake is still poor  Objective: Vitals:   05/14/20 0621 05/14/20 0925  BP: 121/79 102/72  Pulse: 72 71  Resp: 18 18  Temp: (!) 97.4 F (36.3 C) 98.4 F (36.9 C)  SpO2: 98% 95%    Intake/Output Summary (Last 24 hours) at 05/14/2020 1101 Last data filed at 05/14/2020 0900 Gross per 24 hour  Intake 780 ml  Output 200 ml  Net 580 ml   Filed Weights   05/05/20 2036 05/07/20 0447 05/12/20 0505  Weight: 54.2 kg 53.7 kg 55.5 kg   Body mass index is 23.9 kg/m.   Physical Exam:  General: pleasant chronically ill extremely cachectic frail female, laying in beds, slow to respond, oriented to self and place HEENT: no JVD CVS: S1S2/RRR Lungs: poor air movement bilaterally Abd: soft, NT, bS present Ext: no edema Psych: Flat affect CNS: Moves all extremities, no localizing signs   Data Review: I have personally reviewed the following laboratory data and studies,  CBC: Recent Labs  Lab 05/08/20 0451 05/09/20 0351 05/10/20 0322 05/11/20 0627  WBC 5.5 5.7 6.5 7.1  HGB 10.8* 10.5* 10.3* 11.3*  HCT 33.6* 32.2* 32.0* 34.9*  MCV 81.6 81.5 81.6 80.6  PLT 193 208 218 824   Basic Metabolic Panel: Recent Labs  Lab 05/08/20 0451 05/09/20 0351 05/10/20 0322 05/11/20 0627 05/12/20 0720  NA 137 139 139 137 138  K 3.9 3.6 3.7 4.0 4.0  CL 110 112* 111 109 109  CO2 18* 17* 19* 19* 20*  GLUCOSE 99 87 98 96 94  BUN 13 12 11 15 16   CREATININE 0.80 0.78 0.74 0.67 0.69  CALCIUM 9.7 9.8 10.1 10.5* 10.2  MG  --   --   --  2.0  --   PHOS  --   --   --  2.3* 2.6   Liver Function Tests: Recent Labs  Lab 05/08/20 0451 05/11/20 0627 05/12/20 0720  AST 29 20 24   ALT 18 28 29   ALKPHOS 57 60 53  BILITOT  0.3 0.4 0.3  PROT 6.8 7.7 7.6  ALBUMIN 2.8* 2.8* 2.8*   No results for input(s): LIPASE, AMYLASE in the last 168 hours. No results for input(s): AMMONIA in the last 168 hours. Cardiac Enzymes: No results for input(s): CKTOTAL, CKMB, CKMBINDEX, TROPONINI in the last 168 hours. BNP (last 3 results) No results for input(s): BNP in the last 8760 hours.  ProBNP (last 3 results) No results for input(s): PROBNP in the last 8760 hours.  CBG: Recent Labs  Lab 05/10/20 2055 05/11/20 0642  GLUCAP 81 98   No results found for this or any previous visit (from the past 240 hour(s)).   Studies: No results found.  Domenic Polite, MD  [facility] 05/14/2020

## 2020-05-15 NOTE — Progress Notes (Signed)
Patient seen and examined, no changes from my note yesterday remains debilitated waiting for long-term placement  Domenic Polite, MD

## 2020-05-15 NOTE — Plan of Care (Signed)
  Problem: Activity: Goal: Risk for activity intolerance will decrease Outcome: Progressing   Problem: Nutrition: Goal: Adequate nutrition will be maintained Outcome: Progressing   

## 2020-05-16 ENCOUNTER — Inpatient Hospital Stay (HOSPITAL_COMMUNITY): Payer: HMO

## 2020-05-16 LAB — BASIC METABOLIC PANEL
Anion gap: 11 (ref 5–15)
BUN: 23 mg/dL (ref 8–23)
CO2: 17 mmol/L — ABNORMAL LOW (ref 22–32)
Calcium: 10.1 mg/dL (ref 8.9–10.3)
Chloride: 106 mmol/L (ref 98–111)
Creatinine, Ser: 0.83 mg/dL (ref 0.44–1.00)
GFR calc Af Amer: >60 mL/min (ref >60)
GFR calc non Af Amer: >60 mL/min (ref >60)
Glucose, Bld: 104 mg/dL — ABNORMAL HIGH (ref 70–99)
Potassium: 4.5 mmol/L (ref 3.5–5.1)
Sodium: 134 mmol/L — ABNORMAL LOW (ref 135–145)

## 2020-05-16 LAB — CBC
HCT: 34.4 % — ABNORMAL LOW (ref 36.0–46.0)
Hemoglobin: 11 g/dL — ABNORMAL LOW (ref 12.0–15.0)
MCH: 25.8 pg — ABNORMAL LOW (ref 26.0–34.0)
MCHC: 32 g/dL (ref 30.0–36.0)
MCV: 80.8 fL (ref 80.0–100.0)
Platelets: 337 10*3/uL (ref 150–400)
RBC: 4.26 MIL/uL (ref 3.87–5.11)
RDW: 19.9 % — ABNORMAL HIGH (ref 11.5–15.5)
WBC: 5.2 10*3/uL (ref 4.0–10.5)
nRBC: 0 % (ref 0.0–0.2)

## 2020-05-16 NOTE — Plan of Care (Signed)
  Problem: Activity: Goal: Risk for activity intolerance will decrease Outcome: Progressing   Problem: Nutrition: Goal: Adequate nutrition will be maintained Outcome: Progressing   

## 2020-05-16 NOTE — Progress Notes (Signed)
PROGRESS NOTE  VERMA GROTHAUS LKG:[id number] DOB: 06/17/1956 DOA: 04/13/2020 PCP: Lujean Amel, MD   LOS: 33 days   Brief narrative:  64 year old with past medical history significant for dementia, Parkinson disease, hypertension, hypercalcemia, chronic diastolic heart failure presented to the hospital with worsening confusion, poor oral intake for 3 days prior to admission.  Patient had stopped taking her medication 3 days prior to admission.  On arrival to the ED, patient was severely dehydrated with sodium 154, chloride 117, calcium 11.2, corrected calcium greater than 12.5, severe hypoalbuminemia.  CT head showed chronic cortical right parietal lobe infarct, no acute findings. Patient was admitted with a diagnosis of hypercalcemia and dehydration and metabolic encephalopathy, severe malnutrition and failure to thrive.  Patient was started on IV fluids and her electrolytes improved.  Patient became more alert and back to baseline however she remains intermittently confused due to her baseline dementia.  PT evaluated patient and is recommending a skilled nursing facility.  PTH and vitamin D was noted to be normal,  SPEP normal, Ca trended up again and was given bisphosphonates and calcitonin, PTHrp was negative.  Currently awaiting placement, does not have long term care insurance.   Assessment/Plan:  Severe dehydration with hypernatremia: Adult failure to thrive  Resolved.  On the background of dementia and Parkinson's disease.  Off IV fluids.  Encouraged oral hydration.  High chance of recurrence of dehydration due to Parkinson's disease and poor oral intake. -poor long term prognosis, pt continues to wish for full scope of Rx and family wants Rehab  Acute metabolic encephalopathy:   Secondary to hypernatremia, severe hypercalcemia and dehydration.  Improved. Urinalysis, blood cultures are negative. Extensive work-up for encephalopathy was negative.   -she does have failure to thrive,  dementia, parkinsonism.    Low-grade temp overnight 8/22 -Afebrile now, chest x-ray unremarkable, follow-up urinalysis and rule out urinary retention    Suspected aspiration pneumonia   Secondary to dementia, Parkinson's disease and metabolic encephalopathy.  Completed Unasyn course.  Blood cultures negative in 5 days.   -  Currently on dysphagia 3 diet.  -  Continue aspiration precautions.  Hypercalcemia likely worsened by dehydration. PTH was normal, vitamin D level 34. SPEP without monoclonal protein. PTH rp <2.0.  Received 1 dose of Zometa on 8/6.  -Corrected calcium borderline high.  - encourage oral hydration  - Patient is mostly bedbound at this time.  Mild hypophosphatemia.   -Replaced  Anorexia/Severe protein calorie malnutrition  -continue Ensure, Megace.  Very low albumin.   Essential hypertension: Continue with Coreg, ibersartan.      Parkinsons disease /dementia; Continue Sinemet, donepezil.   Aspiration precaution.  Thoracic aortic aneurysm Incidentally noted on CT scanning, will need outpatient follow-up  Oral thrush; nystatin, improved.   Conjunctivitis; improved.  On Cipro eyedrops.  Will discontinue.  Palliative care encounter Prognosis is poor, discussed with patient on multiple occasions, continues to wish for full code and full scope of treatment at this time  -Recommend palliative care follow-up at SNF   DVT prophylaxis: enoxaparin (LOVENOX) injection 40 mg Start: 04/25/20 1400   Code Status: Full code Family Communication: Discussed with patient, no family at bedside  Status is: Inpatient  Remains inpatient appropriate because:Unsafe DC plan, awaiting long-term placement.  Dispo:  Patient From: [facility]  Planned Disposition: [place] with palliative care.  Medically stable for disposition.  Expected discharge date: unknown  Medically stable for discharge: Yes  Consultants:  None   Procedures:  None  Antibiotics:  . Unasyn 05/04/2020>8/15  Anti-infectives (From admission, onward)   Start     Dose/Rate Route Frequency Ordered Stop   05/04/20 0845  ampicillin-sulbactam (UNASYN) 1.5 g in sodium chloride 0.9 % 100 mL IVPB  Status:  Discontinued        1.5 g 200 mL/hr over 30 Minutes Intravenous Every 6 hours 05/04/20 0838 05/09/20 1140     Subjective: Low-grade temp overnight  Objective: Vitals:   05/16/20 0926 05/16/20 0931  BP: 128/82 129/83  Pulse: 75 92  Resp: 18 18  Temp: 99.6 F (37.6 C) 97.7 F (36.5 C)  SpO2: 98% 100%    Intake/Output Summary (Last 24 hours) at 05/16/2020 1117 Last data filed at 05/16/2020 0935 Gross per 24 hour  Intake 510 ml  Output 620 ml  Net -110 ml   Filed Weights   05/12/20 0505 05/14/20 2100 05/15/20 2052  Weight: 55.5 kg 51.7 kg 51.9 kg   Body mass index is 22.35 kg/m.   Physical Exam:  General: Pleasant chronically ill extremely cachectic female lying in bed, slow to respond, interactive, oriented to self and place HEENT: No JVD CVS: S1-S2, regular rate rhythm Lungs: Poor air movement bilaterally Abdomen: Soft, nontender, bowel sounds present Extremities: No edema  Psych: Flat affect CNS: Moves all extremities, no localizing signs   Data Review: I have personally reviewed the following laboratory data and studies,  CBC: Recent Labs  Lab 05/10/20 0322 05/11/20 0627 05/16/20 0748  WBC 6.5 7.1 5.2  HGB 10.3* 11.3* 11.0*  HCT 32.0* 34.9* 34.4*  MCV 81.6 80.6 80.8  PLT 218 233 329   Basic Metabolic Panel: Recent Labs  Lab 05/10/20 0322 05/11/20 0627 05/12/20 0720 05/16/20 0748  NA 139 137 138 134*  K 3.7 4.0 4.0 4.5  CL 111 109 109 106  CO2 19* 19* 20* 17*  GLUCOSE 98 96 94 104*  BUN 11 15 16 23   CREATININE 0.74 0.67 0.69 0.83  CALCIUM 10.1 10.5* 10.2 10.1  MG  --  2.0  --   --   PHOS  --  2.3* 2.6  --    Liver Function Tests: Recent Labs  Lab 05/11/20 0627 05/12/20 0720  AST 20 24  ALT 28  29  ALKPHOS 60 53  BILITOT 0.4 0.3  PROT 7.7 7.6  ALBUMIN 2.8* 2.8*   No results for input(s): LIPASE, AMYLASE in the last 168 hours. No results for input(s): AMMONIA in the last 168 hours. Cardiac Enzymes: No results for input(s): CKTOTAL, CKMB, CKMBINDEX, TROPONINI in the last 168 hours. BNP (last 3 results) No results for input(s): BNP in the last 8760 hours.  ProBNP (last 3 results) No results for input(s): PROBNP in the last 8760 hours.  CBG: Recent Labs  Lab 05/10/20 2055 05/11/20 0642  GLUCAP 81 98   No results found for this or any previous visit (from the past 240 hour(s)).   Studies: DG Chest 1 View  Result Date: 05/16/2020 CLINICAL DATA:  64 year old female with fatigue, failure thrive EXAM: CHEST  1 VIEW COMPARISON:  Prior chest x-ray 05/04/2020 FINDINGS: Left subclavian approach biventricular cardiac rhythm maintenance device remains in stable position. Leads project over the right atrium, right ventricle and overlying the left ventricle. Patient is status post median sternotomy with evidence of prior aortic valve replacement. Stable atherosclerotic calcifications in the transverse aorta. The heart remains normal in size. No pulmonary edema, focal airspace consolidation, pleural effusion or pneumothorax. IMPRESSION: Stable chest x-ray without evidence  of acute cardiopulmonary disease. Electronically Signed   By: Jacqulynn Cadet M.D.   On: 05/16/2020 10:28    Domenic Polite, MD  [facility] 05/16/2020

## 2020-05-16 NOTE — Progress Notes (Signed)
Called by RN who reported pt developed fever to 100.4 F. Given tylenol.  Reviewed chart Check CBC, BMP, CXR, UA

## 2020-05-16 NOTE — Plan of Care (Signed)
  Problem: Nutrition: Goal: Adequate nutrition will be maintained Outcome: Progressing   Problem: Safety: Goal: Ability to remain free from injury will improve Outcome: Progressing   

## 2020-05-16 NOTE — Progress Notes (Signed)
   05/16/20 0538  Vitals  Temp (!) 100.4 F (38 C)  Temp Source Oral  BP (!) 154/97  MAP (mmHg) 114  BP Method Automatic  Pulse Rate 84  Pulse Rate Source Monitor  Resp 20  MEWS COLOR  MEWS Score Color Green  Oxygen Therapy  SpO2 99 %  O2 Device Room Air  Pain Assessment  Pain Scale 0-10  Pain Score 0  MEWS Score  MEWS Temp 0  MEWS Systolic 0  MEWS Pulse 0  MEWS RR 0  MEWS LOC 0  MEWS Score 0  Provider Notification  Provider Name/Title Chotiner, MD  Date Provider Notified 05/16/20  Time Provider Notified [phone]  Notification Type Page  Notification Reason  (fever )  Response See new orders  Date of Provider Response 05/16/20  Time of Provider Response [phone]

## 2020-05-17 LAB — URINALYSIS, ROUTINE W REFLEX MICROSCOPIC
Bacteria, UA: NONE SEEN
Bilirubin Urine: NEGATIVE
Glucose, UA: NEGATIVE mg/dL
Hgb urine dipstick: NEGATIVE
Ketones, ur: NEGATIVE mg/dL
Nitrite: NEGATIVE
Protein, ur: NEGATIVE mg/dL
Specific Gravity, Urine: 1.01 (ref 1.005–1.030)
pH: 7 (ref 5.0–8.0)

## 2020-05-17 LAB — COMPREHENSIVE METABOLIC PANEL
ALT: 17 U/L (ref 0–44)
AST: 27 U/L (ref 15–41)
Albumin: 2.9 g/dL — ABNORMAL LOW (ref 3.5–5.0)
Alkaline Phosphatase: 51 U/L (ref 38–126)
Anion gap: 12 (ref 5–15)
BUN: 24 mg/dL — ABNORMAL HIGH (ref 8–23)
CO2: 17 mmol/L — ABNORMAL LOW (ref 22–32)
Calcium: 10.2 mg/dL (ref 8.9–10.3)
Chloride: 106 mmol/L (ref 98–111)
Creatinine, Ser: 0.84 mg/dL (ref 0.44–1.00)
GFR calc Af Amer: >60 mL/min (ref >60)
GFR calc non Af Amer: >60 mL/min (ref >60)
Glucose, Bld: 76 mg/dL (ref 70–99)
Potassium: 4.7 mmol/L (ref 3.5–5.1)
Sodium: 135 mmol/L (ref 135–145)
Total Bilirubin: 0.2 mg/dL — ABNORMAL LOW (ref 0.3–1.2)
Total Protein: 8.1 g/dL (ref 6.5–8.1)

## 2020-05-17 LAB — CBC
HCT: 35 % — ABNORMAL LOW (ref 36.0–46.0)
Hemoglobin: 11.1 g/dL — ABNORMAL LOW (ref 12.0–15.0)
MCH: 25.9 pg — ABNORMAL LOW (ref 26.0–34.0)
MCHC: 31.7 g/dL (ref 30.0–36.0)
MCV: 81.8 fL (ref 80.0–100.0)
Platelets: 307 10*3/uL (ref 150–400)
RBC: 4.28 MIL/uL (ref 3.87–5.11)
RDW: 19.8 % — ABNORMAL HIGH (ref 11.5–15.5)
WBC: 4.4 10*3/uL (ref 4.0–10.5)
nRBC: 0 % (ref 0.0–0.2)

## 2020-05-17 NOTE — Progress Notes (Signed)
No changes, sitting up in bed waiting to be fed breakfast. -Remains medically stable, awaiting Medicaid for long-term placement  Domenic Polite, MD

## 2020-05-17 NOTE — Plan of Care (Signed)
  Problem: Safety: Goal: Ability to remain free from injury will improve Outcome: Progressing   

## 2020-05-18 ENCOUNTER — Inpatient Hospital Stay (HOSPITAL_COMMUNITY): Payer: HMO

## 2020-05-18 LAB — URINALYSIS, ROUTINE W REFLEX MICROSCOPIC
Bilirubin Urine: NEGATIVE
Glucose, UA: NEGATIVE mg/dL
Hgb urine dipstick: NEGATIVE
Ketones, ur: NEGATIVE mg/dL
Leukocytes,Ua: NEGATIVE
Nitrite: NEGATIVE
Protein, ur: 100 mg/dL — AB
Specific Gravity, Urine: 1.02 (ref 1.005–1.030)
pH: 7 (ref 5.0–8.0)

## 2020-05-18 MED ORDER — SODIUM CHLORIDE 0.9 % IV SOLN: INTRAVENOUS | Status: AC

## 2020-05-18 MED ORDER — SODIUM CHLORIDE 0.9 % IV SOLN: INTRAVENOUS | Status: DC

## 2020-05-18 NOTE — Progress Notes (Signed)
PT Cancellation Note  Patient Details Name: [name] MRN: [id number] DOB: [birth year]   Cancelled Treatment:    Reason Eval/Treat Not Completed: Medical issues which prohibited therapy.  Has extremely low BP and will retry as time and pt allow.   Ramond Dial 05/18/2020, 1:06 PM   Mee Hives, PT MS Acute Rehab Dept. Number: [place] and [place]

## 2020-05-18 NOTE — Plan of Care (Signed)
  Problem: Safety: Goal: Ability to remain free from injury will improve Outcome: Progressing   

## 2020-05-18 NOTE — Progress Notes (Signed)
PT Cancellation Note  Patient Details Name: [name] MRN: [id number] DOB: [birth year]   Cancelled Treatment:    Reason Eval/Treat Not Completed: Medical issues which prohibited therapy.  Diastolic BP's are 521-747 in recent collections.  Will retry when values are more controlled.     Ramond Dial 05/18/2020, 9:11 AM   Mee Hives, PT [facility] Acute Rehab Dept. Number: [place] and [place]

## 2020-05-18 NOTE — Progress Notes (Signed)
PROGRESS NOTE  [name] JXB:[id number] DOB: 1955/10/17 DOA: 04/13/2020 PCP: Lujean Amel, MD   LOS: 35 days   Brief narrative:  64 year old with past medical history significant for dementia, Parkinson disease, hypertension, hypercalcemia, chronic diastolic heart failure presented to the hospital with worsening confusion, poor oral intake for 3 days prior to admission.  Patient had stopped taking her medication 3 days prior to admission.  On arrival to the ED, patient was severely dehydrated with sodium 154, chloride 117, calcium 11.2, corrected calcium greater than 12.5, severe hypoalbuminemia.  CT head showed chronic cortical right parietal lobe infarct, no acute findings. Patient was admitted with a diagnosis of hypercalcemia and dehydration and metabolic encephalopathy, severe malnutrition and failure to thrive.  Patient was started on IV fluids and her electrolytes improved.  Patient became more alert and back to baseline however she remains intermittently confused due to her baseline dementia.  PT evaluated patient and is recommending a skilled nursing facility.  PTH and vitamin D was noted to be normal,  SPEP normal, Ca trended up again and was given bisphosphonates and calcitonin, PTHrp was negative.  Currently awaiting placement, does not have long term care insurance.  -8/24 with low-grade fever and tachycardia  Assessment/Plan:  Severe dehydration with hypernatremia: Adult failure to thrive  Resolved.  On the background of dementia and Parkinson's disease.  -Encouraged oral hydration.  High chance of recurrence of dehydration due to Parkinson's disease and poor oral intake. -poor long term prognosis, pt continues to wish for full scope of Rx and family wants Rehab  Acute metabolic encephalopathy:   Secondary to hypernatremia, severe hypercalcemia and dehydration.  Improved. Urinalysis, blood cultures are negative. Extensive work-up for encephalopathy was negative.   -she  does have failure to thrive, dementia, parkinsonism.    Low-grade temp overnight 8/24 -Check chest x-ray and urinalysis -Monitor clinically    Recent aspiration pneumonia   Secondary to dementia, Parkinson's disease and metabolic encephalopathy.  Completed Unasyn course 1 week ago.  Blood cultures negative.   -  Currently on dysphagia 3 diet.  -  Continue aspiration precautions.  Hypercalcemia likely worsened by dehydration. PTH was normal, vitamin D level 34. SPEP without monoclonal protein. PTH rp <2.0.  Received 1 dose of Zometa on 8/6.  -Corrected calcium borderline high.  - encourage oral hydration  - Patient is mostly bedbound at this time.  Mild hypophosphatemia.   -Replaced  Anorexia/Severe protein calorie malnutrition  -continue Ensure, Megace.  Very low albumin.   Essential hypertension: Continue with Coreg, ibersartan.      Parkinsons disease  Dementia; Continue Sinemet, donepezil.   Aspiration precaution.  Thoracic aortic aneurysm Incidentally noted on CT scanning, will need outpatient follow-up  Oral thrush; nystatin, improved.   Conjunctivitis; improved.  On Cipro eyedrops.  Will discontinue.  Palliative care encounter Prognosis is poor, discussed with patient on multiple occasions, continues to wish for full code and full scope of treatment at this time  -Recommend palliative care follow-up at SNF  -Family is unable to care for her at home, awaiting Medicaid for long-term placement  DVT prophylaxis: enoxaparin (LOVENOX) injection 40 mg Start: 04/25/20 1400 Code Status: Full code Family Communication: Discussed with patient, no family at bedside, left message for patient son [name] this morning Status is: Inpatient  Remains inpatient appropriate because:Unsafe DC plan, awaiting long-term placement.  Dispo:  Patient From: [facility]  Planned Disposition: [place] with palliative care.  Medically stable for  disposition.  Expected discharge  date: unknown  Medically stable for discharge: Yes  Consultants:  None   Procedures:   None  Antibiotics:  . Unasyn 05/04/2020>8/15  Anti-infectives (From admission, onward)   Start     Dose/Rate Route Frequency Ordered Stop   05/04/20 0845  ampicillin-sulbactam (UNASYN) 1.5 g in sodium chloride 0.9 % 100 mL IVPB  Status:  Discontinued        1.5 g 200 mL/hr over 30 Minutes Intravenous Every 6 hours 05/04/20 0838 05/09/20 1140     Subjective: Low-grade temp overnight with tachycardia  Objective: Vitals:   05/18/20 0538 05/18/20 0833  BP: (!) 145/108 (!) 165/123  Pulse: 95 (!) 118  Resp: 20 12  Temp: 99.8 F (37.7 C) 99.5 F (37.5 C)  SpO2: 100% 100%    Intake/Output Summary (Last 24 hours) at 05/18/2020 1125 Last data filed at 05/18/2020 0900 Gross per 24 hour  Intake 1110 ml  Output 2340 ml  Net -1230 ml   Filed Weights   05/15/20 2052 05/16/20 2046 05/17/20 2027  Weight: 51.9 kg 53.1 kg 51.3 kg   Body mass index is 22.09 kg/m.   Physical Exam:  General: Pleasant chronically ill cachectic female laying in bed, slow to respond, interactive, oriented to self and place HEENT: No JVD CVS: S1-S2, regular rate rhythm Lungs: Poor air movement bilaterally, diminished at both bases Abdomen: Soft, nontender, bowel sounds present Extremities: No edema Psych: Flat affect  CNS: Moves all extremities, no localizing signs, positive fine tremors   Data Review: I have personally reviewed the following laboratory data and studies,  CBC: Recent Labs  Lab 05/16/20 0748 05/17/20 0400  WBC 5.2 4.4  HGB 11.0* 11.1*  HCT 34.4* 35.0*  MCV 80.8 81.8  PLT 337 893   Basic Metabolic Panel: Recent Labs  Lab 05/12/20 0720 05/16/20 0748 05/17/20 0400  NA 138 134* 135  K 4.0 4.5 4.7  CL 109 106 106  CO2 20* 17* 17*  GLUCOSE 94 104* 76  BUN 16 23 24*  CREATININE 0.69 0.83 0.84  CALCIUM 10.2 10.1 10.2  PHOS 2.6  --   --    Liver  Function Tests: Recent Labs  Lab 05/12/20 0720 05/17/20 0400  AST 24 27  ALT 29 17  ALKPHOS 53 51  BILITOT 0.3 0.2*  PROT 7.6 8.1  ALBUMIN 2.8* 2.9*   No results for input(s): LIPASE, AMYLASE in the last 168 hours. No results for input(s): AMMONIA in the last 168 hours. Cardiac Enzymes: No results for input(s): CKTOTAL, CKMB, CKMBINDEX, TROPONINI in the last 168 hours. BNP (last 3 results) No results for input(s): BNP in the last 8760 hours.  ProBNP (last 3 results) No results for input(s): PROBNP in the last 8760 hours.  CBG: No results for input(s): GLUCAP in the last 168 hours. No results found for this or any previous visit (from the past 240 hour(s)).   Studies: DG CHEST PORT 1 VIEW  Result Date: 05/18/2020 CLINICAL DATA:  Fever. EXAM: PORTABLE CHEST 1 VIEW COMPARISON:  05/16/2020 FINDINGS: 0915 hours. The lungs are clear without focal pneumonia, edema, pneumothorax or pleural effusion. The cardiopericardial silhouette is within normal limits for size. Left-sided pacer/AICD again noted. The visualized bony structures of the thorax show now acute abnormality. IMPRESSION: No active disease. Electronically Signed   By: Misty Stanley M.D.   On: 05/18/2020 09:26    Domenic Polite, MD  [facility] 05/18/2020

## 2020-05-18 NOTE — Progress Notes (Signed)
Nutrition Follow-up  DOCUMENTATION CODES:   Non-severe (moderate) malnutrition in context of chronic illness  INTERVENTION:  Encourage adequate PO intake  Continue Ensure Enlive po BID, each supplement provides 350 kcal and 20 grams of protein  Continue Boost Breeze po BID, each supplement provides 250 kcal and 9 grams of protein  Continue Magic cup TID with meals, each supplement provides 290 kcal and 9 grams of protein  Continue MVI daily  NUTRITION DIAGNOSIS:   Moderate Malnutrition related to chronic illness (dementia) as evidenced by mild muscle depletion, mild fat depletion, moderate fat depletion, moderate muscle depletion, percent weight loss.  Ongoing  GOAL:   Patient will meet greater than or equal to 90% of their needs  Progressing  MONITOR:   PO intake, Supplement acceptance, Weight trends, Labs, I & O's  REASON FOR ASSESSMENT:   Consult Assessment of nutrition requirement/status  ASSESSMENT:   Pt admitted with severe dehydration and hypernatremia and acute encephalopathy. PMH includes advanced dementia, Parkinson disease, HTN, hypercalcemia, CHF.  Pt is medically stable and awaiting Medicaid for long-term placement. Per MD, pt with high chance of recurrence of dehydration due to Parkinson's disease and poor oral intake.   Pt accepting supplements. Discussed pt with RN who reports pt is not finishing supplements.   PO Intake: 25-100% x last 8 recorded meals (45% average meal intake)  Labs: corrected Ca 11.08(H) Medications:Megace, MVI, Miralax, Phos-NaK, Senokot-S  Diet Order:   Diet Order            DIET DYS 3 Room service appropriate? Yes; Fluid consistency: Thin  Diet effective now                 EDUCATION NEEDS:   No education needs have been identified at this time  Skin:  Skin Assessment: Skin Integrity Issues: Skin Integrity Issues:: Other (Comment) Other: MASD buttocks  Last BM:  8/21 type 5  Height:   Ht Readings from Last  1 Encounters:  05/01/20 5' (1.524 m)    Weight:   Wt Readings from Last 1 Encounters:  05/17/20 51.3 kg    BMI:  Body mass index is 22.09 kg/m.  Estimated Nutritional Needs:   Kcal:  1400-1600  Protein:  70-85 grams  Fluid:  >1.4L/d    Larkin Ina, MS, RD, LDN RD pager number and weekend/on-call pager number located in [place].

## 2020-05-19 LAB — COMPREHENSIVE METABOLIC PANEL
ALT: 22 U/L (ref 0–44)
AST: 23 U/L (ref 15–41)
Albumin: 2.9 g/dL — ABNORMAL LOW (ref 3.5–5.0)
Alkaline Phosphatase: 54 U/L (ref 38–126)
Anion gap: 10 (ref 5–15)
BUN: 18 mg/dL (ref 8–23)
CO2: 16 mmol/L — ABNORMAL LOW (ref 22–32)
Calcium: 9.9 mg/dL (ref 8.9–10.3)
Chloride: 110 mmol/L (ref 98–111)
Creatinine, Ser: 0.76 mg/dL (ref 0.44–1.00)
GFR calc Af Amer: >60 mL/min (ref >60)
GFR calc non Af Amer: >60 mL/min (ref >60)
Glucose, Bld: 94 mg/dL (ref 70–99)
Potassium: 4.5 mmol/L (ref 3.5–5.1)
Sodium: 136 mmol/L (ref 135–145)
Total Bilirubin: 0.4 mg/dL (ref 0.3–1.2)
Total Protein: 7.8 g/dL (ref 6.5–8.1)

## 2020-05-19 LAB — CBC
HCT: 34.7 % — ABNORMAL LOW (ref 36.0–46.0)
Hemoglobin: 10.9 g/dL — ABNORMAL LOW (ref 12.0–15.0)
MCH: 25.6 pg — ABNORMAL LOW (ref 26.0–34.0)
MCHC: 31.4 g/dL (ref 30.0–36.0)
MCV: 81.5 fL (ref 80.0–100.0)
Platelets: 294 10*3/uL (ref 150–400)
RBC: 4.26 MIL/uL (ref 3.87–5.11)
RDW: 19.8 % — ABNORMAL HIGH (ref 11.5–15.5)
WBC: 3.8 10*3/uL — ABNORMAL LOW (ref 4.0–10.5)
nRBC: 0 % (ref 0.0–0.2)

## 2020-05-19 MED ORDER — SODIUM CHLORIDE 0.9 % IV SOLN: INTRAVENOUS | Status: DC

## 2020-05-19 MED ORDER — SODIUM CHLORIDE 0.9 % IV SOLN
1.0000 g | INTRAVENOUS | Status: AC
  Administered 2020-05-19 – 2020-05-21 (×3): 1 g via INTRAVENOUS
  Filled 2020-05-19: qty 1
  Filled 2020-05-19 (×2): qty 10

## 2020-05-19 NOTE — Clinical Social Work Note (Signed)
CSW continuing to follow and awaiting patient being approved for long-term care Medicaid. Text sent to son [name] providing update on FL-2 done and sent to Adult Medicaid worker, Ms. Cherene Altes, and contact made by e-mail to Adult Medicaid worker on 8/18 and no response as of yet. CSW requested that son call or e-mail to request an update.   CSW received text response from son indicating that he called and left [name] a message. CSW responded and asked [name] to also e-mail [name], and e-mail address provided.  CSW will continue to follow and try to reach [facility] worker regarding disposition of patient's Medicaid application.   Mubashir Mallek Givens, MSW, LCSW Licensed Clinical Social Worker [place] [phone]

## 2020-05-19 NOTE — Progress Notes (Signed)
PROGRESS NOTE    [name]  YQM:[id number] DOB: 07/06/1956 DOA: 04/13/2020 PCP: Lujean Amel, MD   Brief Narrative: 64 year old with past medical history significant for dementia, Parkinson disease, hypertension, hypercalcemia, chronic diastolic heart failure presented to the hospital with worsening confusion, poor oral intake for 3 days prior to admission.  Patient had stopped taking her medication 3 days prior to admission.  On arrival to the ED, patient was severely dehydrated with sodium 154, chloride 117, calcium 11.2 corrected calcium greater than 12, severe hypoalbuminemia.  CT head showed chronic cortical right parietal lobe infarct, no acute finding.  Patient was admitted with a diagnosis of hypercalcemia and dehydration and metabolic encephalopathy, severe malnutrition and failure to thrive.  Patient was a started on IV fluids and her electrolytes improved.  Patient became more alert and back to baseline however she remains intermittently confused due to her baseline dementia.  PT evaluated patient and is recommending a skilled nursing facility.  PTH and vitamin D was noted to be normal.  SPEP normal, calcium trending up again and was given biphosphonate and calcitonin, PTH RP was negative.  Currently awaiting placement, does not have long-term care insurance. -8/24 with low-grade fever and tachycardia.  Urine culture growing E E. coli.  Started on IV antibiotics.   Assessment & Plan:   Active Problems:   Essential hypertension   Dementia due to Parkinson's disease without behavioral disturbance (HCC)   Dehydration, severe   Dehydration   Acute hypernatremia   Hypercalcemia   Malnutrition of moderate degree   1-Severe dehydration with hypernatremia, adult failure to thrive Resolved with IV fluids Patient with a background of dementia and Parkinson's disease a risk for dehydration. Encourage oral intake  Acute metabolic encephalopathy: Secondary to hypernatremia, severe  hypercalcemia and dehydration.  Improved Extensive work-up for encephalopathy was negative. He does have failure to thrive, dementia and parkinsonism  UTI;Low-grade fever 8/24 Urine culture growing E. Coli Started ceftriaxone for 3 days.  Metabolic acidosis; start fluids/   Recent aspiration pneumonia: Secondary to dementia, Parkinson disease and metabolic encephalopathy. Completed course of Unasyn for 1 week  Hypercalcemia: Likely worsening by dehydration PTH was normal vitamin D 34, SPEP without monoclonal protein.  PTH RP less than 2.0. Received 1 dose of Zometa on 8/6 Continue oral intake  Mild hypophosphatemia: Resolved  Anorexia/severe protein caloric malnutrition: Continue Ensure Megace.  Parkinson disease, dementia Continue with Sinemet and donezepil  Thoracic Aortic  aneurysmal: Incidental finding on CT scan.  Need follow-up  Oral thrush: Nystatin improved Conjunctivitis resolved treated with Cipro eyedrops.  Palliative care encounter: Patient prognosis is poor patient will want to continue with full scope there. Recommend palliative care to follow-up at SNF  Nutrition Problem: Moderate Malnutrition Etiology: chronic illness (dementia)    Signs/Symptoms: mild muscle depletion, mild fat depletion, moderate fat depletion, moderate muscle depletion, percent weight loss Percent weight loss: 11 %    Interventions: Magic cup, Ensure Enlive (each supplement provides 350kcal and 20 grams of protein)  Estimated body mass index is 22.09 kg/m as calculated from the following:   Height as of this encounter: 5' (1.524 m).   Weight as of this encounter: 51.3 kg.   DVT prophylaxis: Lovenox Code Status: Full code Family Communication: no family at bedside.  Disposition Plan:  Status is: Inpatient  Remains inpatient appropriate because:Unsafe d/c plan   Dispo:  Patient From: [facility]  Planned Disposition: [place]  Expected  discharge date: 05/11/20  Medically stable for discharge:  Yes         Consultants:   None  Procedures:   None  Antimicrobials:    Subjective: Alert, denies pain.  Feels well.   Objective: Vitals:   05/18/20 1642 05/18/20 2043 05/19/20 0500 05/19/20 0820  BP: 107/64 101/82 103/80 110/77  Pulse: 66 71 65 75  Resp: 14 16 14 15   Temp: 98.2 F (36.8 C) 98.1 F (36.7 C) 97.9 F (36.6 C) 98 F (36.7 C)  TempSrc: Oral   Oral  SpO2: 97% 100% 100% 98%  Weight:  51.3 kg    Height:        Intake/Output Summary (Last 24 hours) at 05/19/2020 1452 Last data filed at 05/19/2020 1342 Gross per 24 hour  Intake 2141.45 ml  Output 1375 ml  Net 766.45 ml   Filed Weights   05/16/20 2046 05/17/20 2027 05/18/20 2043  Weight: 53.1 kg 51.3 kg 51.3 kg    Examination:  General exam: Appears calm and comfortable , chronic ill appearing Respiratory system: Clear to auscultation. Respiratory effort normal. Cardiovascular system: S1 & S2 heard, RRR. No JVD, murmurs, rubs, gallops or clicks. No pedal edema. Gastrointestinal system: Abdomen is nondistended, soft and nontender.  Central nervous system: Alert and oriented. No focal neurological deficits. Extremities: no edema  Data Reviewed: I have personally reviewed following labs and imaging studies  CBC: Recent Labs  Lab 05/16/20 0748 05/17/20 0400 05/19/20 0651  WBC 5.2 4.4 3.8*  HGB 11.0* 11.1* 10.9*  HCT 34.4* 35.0* 34.7*  MCV 80.8 81.8 81.5  PLT 337 307 675   Basic Metabolic Panel: Recent Labs  Lab 05/16/20 0748 05/17/20 0400 05/19/20 0651  NA 134* 135 136  K 4.5 4.7 4.5  CL 106 106 110  CO2 17* 17* 16*  GLUCOSE 104* 76 94  BUN 23 24* 18  CREATININE 0.83 0.84 0.76  CALCIUM 10.1 10.2 9.9   GFR: Estimated Creatinine Clearance: 49.7 mL/min (by C-G formula based on SCr of 0.76 mg/dL). Liver Function Tests: Recent Labs  Lab 05/17/20 0400 05/19/20 0651  AST 27 23  ALT 17 22  ALKPHOS 51 54  BILITOT  0.2* 0.4  PROT 8.1 7.8  ALBUMIN 2.9* 2.9*   No results for input(s): LIPASE, AMYLASE in the last 168 hours. No results for input(s): AMMONIA in the last 168 hours. Coagulation Profile: No results for input(s): INR, PROTIME in the last 168 hours. Cardiac Enzymes: No results for input(s): CKTOTAL, CKMB, CKMBINDEX, TROPONINI in the last 168 hours. BNP (last 3 results) No results for input(s): PROBNP in the last 8760 hours. HbA1C: No results for input(s): HGBA1C in the last 72 hours. CBG: No results for input(s): GLUCAP in the last 168 hours. Lipid Profile: No results for input(s): CHOL, HDL, LDLCALC, TRIG, CHOLHDL, LDLDIRECT in the last 72 hours. Thyroid Function Tests: No results for input(s): TSH, T4TOTAL, FREET4, T3FREE, THYROIDAB in the last 72 hours. Anemia Panel: No results for input(s): VITAMINB12, FOLATE, FERRITIN, TIBC, IRON, RETICCTPCT in the last 72 hours. Sepsis Labs: No results for input(s): PROCALCITON, LATICACIDVEN in the last 168 hours.  Recent Results (from the past 240 hour(s))  Culture, Urine     Status: Abnormal (Preliminary result)   Collection Time: 05/18/20 11:18 AM   Specimen: Urine, Random  Result Value Ref Range Status   Specimen Description URINE, RANDOM  Final   Special Requests NONE  Final   Culture (A)  Final    >=100,000 COLONIES/mL ESCHERICHIA COLI SUSCEPTIBILITIES TO FOLLOW Performed at [facility], [place] [address], [place], [place] [address]    Report Status PENDING  Incomplete         Radiology Studies: DG CHEST PORT 1 VIEW  Result Date: 05/18/2020 CLINICAL DATA:  Fever. EXAM: PORTABLE CHEST 1 VIEW COMPARISON:  05/16/2020 FINDINGS: 0915 hours. The lungs are clear without focal pneumonia, edema, pneumothorax or pleural effusion. The cardiopericardial silhouette is within normal limits for size. Left-sided pacer/AICD again noted. The visualized bony structures of the thorax show now acute abnormality. IMPRESSION: No active disease.  Electronically Signed   By: Misty Stanley M.D.   On: 05/18/2020 09:26        Scheduled Meds: . aspirin  325 mg Oral Daily  . carbidopa-levodopa  1 tablet Oral TID  . carvedilol  12.5 mg Oral BID WC  . chlorhexidine  15 mL Mouth Rinse BID  . divalproex  125 mg Oral Daily  . donepezil  10 mg Oral Daily  . enoxaparin (LOVENOX) injection  40 mg Subcutaneous Q24H  . feeding supplement  1 Container Oral BID BM  . feeding supplement (ENSURE ENLIVE)  237 mL Oral BID BM  . hydroxypropyl methylcellulose / hypromellose  1 drop Both Eyes TID  . mouth rinse  15 mL Mouth Rinse q12n4p  . megestrol  400 mg Oral Daily  . multivitamin with minerals  1 tablet Oral Daily  . nystatin  5 mL Oral QID  . polyethylene glycol  17 g Oral Daily  . rosuvastatin  10 mg Oral Daily  . senna-docusate  1 tablet Oral BID  . sertraline  100 mg Oral QHS  . sodium chloride flush  3 mL Intravenous Once   Continuous Infusions: . cefTRIAXone (ROCEPHIN)  IV 1 g (05/19/20 1342)     LOS: 36 days    Time spent: 35 minutes.     Elmarie Shiley, MD [facility]   If 7PM-7AM, please contact night-coverage [email or web address]  05/19/2020, 2:52 PM

## 2020-05-19 NOTE — Progress Notes (Signed)
Physical Therapy Treatment Patient Details Name: [name] MRN: [id number] DOB: 07-Dec-1955 Today's Date: 05/19/2020    History of Present Illness [name] is a 64 y.o. female with medical history significant of advanced dementia, Parkinson disease, hypertension, hypercalcemia, chronic diastolic CHF, presented with worsening confusion and not eating or drinking x3 days.  Admitted with metabolic encephalopathy and hypernatremia.  Per TOC notes, pt unable to return to ALF due to level of assist, awaiting Medicaid for SNF placement.    PT Comments    Continuing work on functional mobility and activity tolerance;  Session focused on safe transfers OOB; and obtained BPs in different positions as well (see below); Performed heel cord stretches as well as chest opening stretches and pectoral stretching; Very tight calves/heel cords, with less dorsiflexion range (almost getting to neutral ankle bilaterally); Will request PRAFOs with walking soles   Follow Up Recommendations  SNF     Equipment Recommendations  Hospital bed;Wheelchair (measurements PT)    Recommendations for Other Services       Precautions / Restrictions Precautions Precautions: Fall    Mobility  Bed Mobility Overal bed mobility: Needs Assistance Bed Mobility: Supine to Sit Rolling: Max assist Sidelying to sit: Total assist       General bed mobility comments: Rolling: pt able to initiate moving LE's off of bed and move UE's to grab handrail. Required verbal cueing and hand placement on bedrail. Supine to Sit: TotalA  Transfers Overall transfer level: Needs assistance Equipment used: 2 person hand held assist (and bil support with gait belt) Transfers: Squat Pivot Transfers     Squat pivot transfers: Total assist     General transfer comment: TotalAx1 for squat-pivot transfer with use of chuck pad and gait belt. Multimodal cues to shift weight anteriorly; tends for feet to slide forward when  transitioning to stand -- needs clise guard  Ambulation/Gait             General Gait Details: unable   Stairs             Wheelchair Mobility    Modified Rankin (Stroke Patients Only)       Balance     Sitting balance-Leahy Scale: Poor Sitting balance - Comments: sits with rounded back and shouldersdespite cues for hip flexion, minG for stability, pt reaching for BUE support at all times but can maintain momentarily without UE support; Block feet and knees when sitting upright, noted some sliding of hips Postural control: Posterior lean   Standing balance-Leahy Scale: Zero Standing balance comment: Total A x 2 to maintain standing balance in [place]. Unable to hold self up                            Cognition Arousal/Alertness: Awake/alert Behavior During Therapy: Flat affect Overall Cognitive Status: Impaired/Different from baseline                                 General Comments: Consistently stating she is fine when asked how she is feeling; Spoke less than usual this session      Exercises Other Exercises Other Exercises: postural work with pt sitting in recliner chair without back support for thoracic and cerivcal extension with shoulder abduction and external rotation for pec stretch    General Comments        Pertinent Vitals/Pain Pain Assessment: Faces Faces Pain Scale: Hurts  a little bit Pain Location: grimace with neck, shoulder and ankle stretches Pain Descriptors / Indicators: Grimacing Pain Intervention(s): Monitored during session    Home Living                      Prior Function            PT Goals (current goals can now be found in the care plan section) Acute Rehab PT Goals Patient Stated Goal: did not state PT Goal Formulation: Patient unable to participate in goal setting Time For Goal Achievement: 05/27/20 Potential to Achieve Goals: Fair Progress towards PT goals: Progressing toward goals  (very slowly)    Frequency    Min 2X/week      PT Plan Current plan remains appropriate    Co-evaluation              AM-PAC PT "6 Clicks" Mobility   Outcome Measure  Help needed turning from your back to your side while in a flat bed without using bedrails?: A Lot Help needed moving from lying on your back to sitting on the side of a flat bed without using bedrails?: A Lot Help needed moving to and from a bed to a chair (including a wheelchair)?: Total Help needed standing up from a chair using your arms (e.g., wheelchair or bedside chair)?: A Lot Help needed to walk in hospital room?: Total Help needed climbing 3-5 steps with a railing? : Total 6 Click Score: 9    End of Session Equipment Utilized During Treatment: Gait belt Activity Tolerance: Patient tolerated treatment well Patient left: in chair;with call bell/phone within reach;with chair alarm set Nurse Communication: Mobility status PT Visit Diagnosis: Unsteadiness on feet (R26.81);Other abnormalities of gait and mobility (R26.89);Muscle weakness (generalized) (M62.81);History of falling (Z91.81)     Time: 1505-6979 PT Time Calculation (min) (ACUTE ONLY): 35 min  Charges:  $Therapeutic Activity: 23-37 mins                     Roney Marion, PT  Acute Rehabilitation Services Pager [phone] Office [place] 05/19/2020, 11:47 AM

## 2020-05-19 NOTE — Plan of Care (Signed)
  Problem: Skin Integrity: Goal: Risk for impaired skin integrity will decrease Outcome: Progressing   

## 2020-05-20 ENCOUNTER — Other Ambulatory Visit: Payer: Self-pay | Admitting: *Deleted

## 2020-05-20 NOTE — Progress Notes (Signed)
PROGRESS NOTE    [name]  YYT:[id number] DOB: 04/03/1956 DOA: 04/13/2020 PCP: Lujean Amel, MD   Brief Narrative: 64 year old with past medical history significant for dementia, Parkinson disease, hypertension, hypercalcemia, chronic diastolic heart failure presented to the hospital with worsening confusion, poor oral intake for 3 days prior to admission.  Patient had stopped taking her medication 3 days prior to admission.  On arrival to the ED, patient was severely dehydrated with sodium 154, chloride 117, calcium 11.2 corrected calcium greater than 12, severe hypoalbuminemia.  CT head showed chronic cortical right parietal lobe infarct, no acute finding.  Patient was admitted with a diagnosis of hypercalcemia and dehydration and metabolic encephalopathy, severe malnutrition and failure to thrive.  Patient was a started on IV fluids and her electrolytes improved.  Patient became more alert and back to baseline however she remains intermittently confused due to her baseline dementia.  PT evaluated patient and is recommending a skilled nursing facility.  PTH and vitamin D was noted to be normal.  SPEP normal, calcium trending up again and was given biphosphonate and calcitonin, PTH RP was negative.  Currently awaiting placement, does not have long-term care insurance. -8/24 with low-grade fever and tachycardia.  Urine culture growing E E. coli.  Started on IV antibiotics.   Assessment & Plan:   Active Problems:   Essential hypertension   Dementia due to Parkinson's disease without behavioral disturbance (HCC)   Dehydration, severe   Dehydration   Acute hypernatremia   Hypercalcemia   Malnutrition of moderate degree   1-Severe dehydration with hypernatremia, adult failure to thrive Resolved with IV fluids Patient with a background of dementia and Parkinson's disease a risk for dehydration. Encourage oral intake Back on fluids due to metabolic acidosis.   Acute metabolic  encephalopathy: Secondary to hypernatremia, severe hypercalcemia and dehydration.  Improved Extensive work-up for encephalopathy was negative. He does have failure to thrive, dementia and parkinsonism  UTI;Low-grade fever 8/24 Urine culture growing E. Coli Started ceftriaxone for 3 days.  Metabolic acidosis; Continue with IV fluids, repeat labs tomorrow.   Recent aspiration pneumonia: Secondary to dementia, Parkinson disease and metabolic encephalopathy. Completed course of Unasyn for 1 week  Hypercalcemia: Likely worsening by dehydration PTH was normal vitamin D 34, SPEP without monoclonal protein.  PTH RP less than 2.0. Received 1 dose of Zometa on 8/6 Continue oral intake  Mild hypophosphatemia: Resolved  Anorexia/severe protein caloric malnutrition: Continue Ensure Megace.  Parkinson disease, dementia Continue with Sinemet and donezepil  Thoracic Aortic  aneurysmal: Incidental finding on CT scan.  Need follow-up  Oral thrush: Nystatin improved Conjunctivitis resolved treated with Cipro eyedrops.  Palliative care encounter: Patient prognosis is poor patient will want to continue with full scope there. Recommend palliative care to follow-up at SNF  Nutrition Problem: Moderate Malnutrition Etiology: chronic illness (dementia)    Signs/Symptoms: mild muscle depletion, mild fat depletion, moderate fat depletion, moderate muscle depletion, percent weight loss Percent weight loss: 11 %    Interventions: Magic cup, Ensure Enlive (each supplement provides 350kcal and 20 grams of protein)  Estimated body mass index is 22.09 kg/m as calculated from the following:   Height as of this encounter: 5' (1.524 m).   Weight as of this encounter: 51.3 kg.   DVT prophylaxis: Lovenox Code Status: Full code Family Communication: no family at bedside.  Disposition Plan:  Status is: Inpatient  Remains inpatient appropriate because:Unsafe d/c plan   Dispo:  Patient From:  [facility]  Planned Disposition:  [place]  Expected discharge date: 05/11/20  Medically stable for discharge: Yes         Consultants:   None  Procedures:   None  Antimicrobials:    Subjective: Alert, no new complaints.   Objective: Vitals:   05/19/20 1700 05/19/20 2119 05/20/20 0456 05/20/20 0928  BP: 138/80 (!) 170/93 (!) 163/90 (!) 152/86  Pulse: 88 62 61 66  Resp: 16 20 20 18   Temp:  98.3 F (36.8 C) 98.6 F (37 C) 98.6 F (37 C)  TempSrc:   Oral Oral  SpO2: 98% 100% 98% 97%  Weight:      Height:        Intake/Output Summary (Last 24 hours) at 05/20/2020 1543 Last data filed at 05/20/2020 1301 Gross per 24 hour  Intake 2775.29 ml  Output 1400 ml  Net 1375.29 ml   Filed Weights   05/16/20 2046 05/17/20 2027 05/18/20 2043  Weight: 53.1 kg 51.3 kg 51.3 kg    Examination:  General exam: Chronic ill appearing, NAD Respiratory system: CTA Cardiovascular system: SS 1, S 2 RRR Gastrointestinal system: BS present, soft, nt Central nervous system: alert Extremities: No edema  Data Reviewed: I have personally reviewed following labs and imaging studies  CBC: Recent Labs  Lab 05/16/20 0748 05/17/20 0400 05/19/20 0651  WBC 5.2 4.4 3.8*  HGB 11.0* 11.1* 10.9*  HCT 34.4* 35.0* 34.7*  MCV 80.8 81.8 81.5  PLT 337 307 115   Basic Metabolic Panel: Recent Labs  Lab 05/16/20 0748 05/17/20 0400 05/19/20 0651  NA 134* 135 136  K 4.5 4.7 4.5  CL 106 106 110  CO2 17* 17* 16*  GLUCOSE 104* 76 94  BUN 23 24* 18  CREATININE 0.83 0.84 0.76  CALCIUM 10.1 10.2 9.9   GFR: Estimated Creatinine Clearance: 49.7 mL/min (by C-G formula based on SCr of 0.76 mg/dL). Liver Function Tests: Recent Labs  Lab 05/17/20 0400 05/19/20 0651  AST 27 23  ALT 17 22  ALKPHOS 51 54  BILITOT 0.2* 0.4  PROT 8.1 7.8  ALBUMIN 2.9* 2.9*   No results for input(s): LIPASE, AMYLASE in the last 168 hours. No results for input(s): AMMONIA in  the last 168 hours. Coagulation Profile: No results for input(s): INR, PROTIME in the last 168 hours. Cardiac Enzymes: No results for input(s): CKTOTAL, CKMB, CKMBINDEX, TROPONINI in the last 168 hours. BNP (last 3 results) No results for input(s): PROBNP in the last 8760 hours. HbA1C: No results for input(s): HGBA1C in the last 72 hours. CBG: No results for input(s): GLUCAP in the last 168 hours. Lipid Profile: No results for input(s): CHOL, HDL, LDLCALC, TRIG, CHOLHDL, LDLDIRECT in the last 72 hours. Thyroid Function Tests: No results for input(s): TSH, T4TOTAL, FREET4, T3FREE, THYROIDAB in the last 72 hours. Anemia Panel: No results for input(s): VITAMINB12, FOLATE, FERRITIN, TIBC, IRON, RETICCTPCT in the last 72 hours. Sepsis Labs: No results for input(s): PROCALCITON, LATICACIDVEN in the last 168 hours.  Recent Results (from the past 240 hour(s))  Culture, Urine     Status: Abnormal (Preliminary result)   Collection Time: 05/18/20 11:18 AM   Specimen: Urine, Random  Result Value Ref Range Status   Specimen Description URINE, RANDOM  Final   Special Requests NONE  Final   Culture (A)  Final    >=100,000 COLONIES/mL ESCHERICHIA COLI CULTURE REINCUBATED FOR BETTER GROWTH Performed at [facility], [address] [place], [place] [address]    Report Status PENDING  Incomplete         Radiology Studies: No results found.      Scheduled Meds: . aspirin  325 mg Oral Daily  . carbidopa-levodopa  1 tablet Oral TID  . carvedilol  12.5 mg Oral BID WC  . chlorhexidine  15 mL Mouth Rinse BID  . divalproex  125 mg Oral Daily  . donepezil  10 mg Oral Daily  . enoxaparin (LOVENOX) injection  40 mg Subcutaneous Q24H  . feeding supplement  1 Container Oral BID BM  . feeding supplement (ENSURE ENLIVE)  237 mL Oral BID BM  . hydroxypropyl methylcellulose / hypromellose  1 drop Both Eyes TID  . mouth rinse  15 mL Mouth Rinse q12n4p  . megestrol  400 mg Oral Daily  .  multivitamin with minerals  1 tablet Oral Daily  . nystatin  5 mL Oral QID  . polyethylene glycol  17 g Oral Daily  . rosuvastatin  10 mg Oral Daily  . senna-docusate  1 tablet Oral BID  . sertraline  100 mg Oral QHS  . sodium chloride flush  3 mL Intravenous Once   Continuous Infusions: . sodium chloride 100 mL/hr at 05/20/20 1110  . cefTRIAXone (ROCEPHIN)  IV 1 g (05/20/20 1228)     LOS: 37 days    Time spent: 35 minutes.     Elmarie Shiley, MD [facility]   If 7PM-7AM, please contact night-coverage [email or web address]  05/20/2020, 3:43 PM

## 2020-05-20 NOTE — Plan of Care (Signed)
°  Problem: Activity: °Goal: Risk for activity intolerance will decrease °Outcome: Progressing °  °Problem: Nutrition: °Goal: Adequate nutrition will be maintained °Outcome: Progressing °  °Problem: Skin Integrity: °Goal: Risk for impaired skin integrity will decrease °Outcome: Progressing °  °

## 2020-05-21 ENCOUNTER — Ambulatory Visit: Payer: Self-pay | Admitting: *Deleted

## 2020-05-21 LAB — CBC
HCT: 31.5 % — ABNORMAL LOW (ref 36.0–46.0)
Hemoglobin: 9.8 g/dL — ABNORMAL LOW (ref 12.0–15.0)
MCH: 25.7 pg — ABNORMAL LOW (ref 26.0–34.0)
MCHC: 31.1 g/dL (ref 30.0–36.0)
MCV: 82.7 fL (ref 80.0–100.0)
Platelets: UNDETERMINED 10*3/uL (ref 150–400)
RBC: 3.81 MIL/uL — ABNORMAL LOW (ref 3.87–5.11)
RDW: 20 % — ABNORMAL HIGH (ref 11.5–15.5)
WBC: 5 10*3/uL (ref 4.0–10.5)
nRBC: 0.4 % — ABNORMAL HIGH (ref 0.0–0.2)

## 2020-05-21 LAB — LACTIC ACID, PLASMA: Lactic Acid, Venous: 1.8 mmol/L (ref 0.5–1.9)

## 2020-05-21 LAB — BASIC METABOLIC PANEL
Anion gap: 12 (ref 5–15)
Anion gap: 10 (ref 5–15)
BUN: 20 mg/dL (ref 8–23)
BUN: 14 mg/dL (ref 8–23)
CO2: 10 mmol/L — ABNORMAL LOW (ref 22–32)
CO2: 15 mmol/L — ABNORMAL LOW (ref 22–32)
Calcium: 10.3 mg/dL (ref 8.9–10.3)
Calcium: 10.2 mg/dL (ref 8.9–10.3)
Chloride: 116 mmol/L — ABNORMAL HIGH (ref 98–111)
Chloride: 112 mmol/L — ABNORMAL HIGH (ref 98–111)
Creatinine, Ser: 0.72 mg/dL (ref 0.44–1.00)
Creatinine, Ser: 0.69 mg/dL (ref 0.44–1.00)
GFR calc Af Amer: >60 mL/min (ref >60)
GFR calc Af Amer: >60 mL/min (ref >60)
GFR calc non Af Amer: >60 mL/min (ref >60)
GFR calc non Af Amer: >60 mL/min (ref >60)
Glucose, Bld: 75 mg/dL (ref 70–99)
Glucose, Bld: 113 mg/dL — ABNORMAL HIGH (ref 70–99)
Potassium: 5.6 mmol/L — ABNORMAL HIGH (ref 3.5–5.1)
Potassium: 4.2 mmol/L (ref 3.5–5.1)
Sodium: 138 mmol/L (ref 135–145)
Sodium: 137 mmol/L (ref 135–145)

## 2020-05-21 MED ORDER — SODIUM BICARBONATE 8.4 % IV SOLN
INTRAVENOUS | Status: DC
  Filled 2020-05-21 (×3): qty 100

## 2020-05-21 NOTE — Clinical Social Work Note (Signed)
Attempted contact with patient's Adult Medicaid worker Cherene Altes [phone]) and message left regarding patient and her Medicaid application. Also emailed Ms. Brown - [email or web address]. CSW will continue to follow and contact Ms. Brown's supervisor next week if no contact from her.  Airiel Oblinger Givens, MSW, LCSW Licensed Clinical Social Worker [place] [phone]

## 2020-05-21 NOTE — Progress Notes (Signed)
PROGRESS NOTE    [name]  LZJ:[id number] DOB: 14-Apr-1956 DOA: 04/13/2020 PCP: Lujean Amel, MD   Brief Narrative: 64 year old with past medical history significant for dementia, Parkinson disease, hypertension, hypercalcemia, chronic diastolic heart failure presented to the hospital with worsening confusion, poor oral intake for 3 days prior to admission.  Patient had stopped taking her medication 3 days prior to admission.  On arrival to the ED, patient was severely dehydrated with sodium 154, chloride 117, calcium 11.2 corrected calcium greater than 12, severe hypoalbuminemia.  CT head showed chronic cortical right parietal lobe infarct, no acute finding.  Patient was admitted with a diagnosis of hypercalcemia and dehydration and metabolic encephalopathy, severe malnutrition and failure to thrive.  Patient was a started on IV fluids and her electrolytes improved.  Patient became more alert and back to baseline however she remains intermittently confused due to her baseline dementia.  PT evaluated patient and is recommending a skilled nursing facility.  PTH and vitamin D was noted to be normal.  SPEP normal, calcium trending up again and was given biphosphonate and calcitonin, PTH RP was negative.  Currently awaiting placement, does not have long-term care insurance. -8/24 with low-grade fever and tachycardia.  Urine culture growing E E. coli.  Started on IV antibiotics.   Assessment & Plan:   Active Problems:   Essential hypertension   Dementia due to Parkinson's disease without behavioral disturbance (HCC)   Dehydration, severe   Dehydration   Acute hypernatremia   Hypercalcemia   Malnutrition of moderate degree   1-Severe dehydration with hypernatremia, adult failure to thrive Resolved with IV fluids Patient with a background of dementia and Parkinson's disease a risk for dehydration. Encourage oral intake Back on fluids due to metabolic acidosis.   Acute metabolic  encephalopathy: Secondary to hypernatremia, severe hypercalcemia and dehydration.  Improved Extensive work-up for encephalopathy was negative. He does have failure to thrive, dementia and parkinsonism  UTI;Low-grade fever 8/24 Urine culture growing E. Coli Started ceftriaxone for 3 days.  Metabolic acidosis; worsening, unclear etiology, RTA?  Started IV bicarb gtt.   Recent aspiration pneumonia: Secondary to dementia, Parkinson disease and metabolic encephalopathy. Completed course of Unasyn for 1 week  Hypercalcemia: Likely worsening by dehydration PTH was normal vitamin D 34, SPEP without monoclonal protein.  PTH RP less than 2.0. Received 1 dose of Zometa on 8/6 Continue oral intake  Mild hypophosphatemia: Resolved  Anorexia/severe protein caloric malnutrition: Continue Ensure Megace.  Parkinson disease, dementia Continue with Sinemet and donezepil  Thoracic Aortic  aneurysmal: Incidental finding on CT scan.  Need follow-up  Oral thrush: Nystatin . resolved Conjunctivitis resolved treated with Cipro eyedrops.  Palliative care encounter: Patient prognosis is poor patient will want to continue with full scope there. Recommend palliative care to follow-up at SNF  Nutrition Problem: Moderate Malnutrition Etiology: chronic illness (dementia)    Signs/Symptoms: mild muscle depletion, mild fat depletion, moderate fat depletion, moderate muscle depletion, percent weight loss Percent weight loss: 11 %    Interventions: Magic cup, Ensure Enlive (each supplement provides 350kcal and 20 grams of protein)  Estimated body mass index is 23.16 kg/m as calculated from the following:   Height as of this encounter: 5' (1.524 m).   Weight as of this encounter: 53.8 kg.   DVT prophylaxis: Lovenox Code Status: Full code Family Communication: no family at bedside.  Disposition Plan:  Status is: Inpatient  Remains inpatient appropriate because:Unsafe d/c  plan   Dispo:  Patient From: [facility]  Planned Disposition: [place]  Expected discharge date: 05/11/20  Medically stable for discharge: Yes         Consultants:   None  Procedures:   None  Antimicrobials:    Subjective: She is alert, she doesn't appears to be symptomatic from acidosis.  She denies pain or diarrhea.   Objective: Vitals:   05/20/20 1644 05/20/20 2006 05/21/20 0459 05/21/20 0736  BP: 116/77 131/88 (!) 159/116 (!) 146/82  Pulse: 80 83 71 88  Resp: 18 20  18   Temp: 98.8 F (37.1 C) 98.2 F (36.8 C) 99.2 F (37.3 C) 98.6 F (37 C)  TempSrc: Oral Oral Oral Oral  SpO2: 99% 98% 99% 98%  Weight:   53.8 kg   Height:        Intake/Output Summary (Last 24 hours) at 05/21/2020 1510 Last data filed at 05/21/2020 1442 Gross per 24 hour  Intake 1471.12 ml  Output 3000 ml  Net -1528.88 ml   Filed Weights   05/17/20 2027 05/18/20 2043 05/21/20 0459  Weight: 51.3 kg 51.3 kg 53.8 kg    Examination:  General exam: NAD Respiratory system: CTA Cardiovascular system: S 1, S 2 RRR Gastrointestinal system: BS present, soft, nt Central nervous system; ALert Extremities: No edema  Data Reviewed: I have personally reviewed following labs and imaging studies  CBC: Recent Labs  Lab 05/16/20 0748 05/17/20 0400 05/19/20 0651 05/21/20 0322  WBC 5.2 4.4 3.8* 5.0  HGB 11.0* 11.1* 10.9* 9.8*  HCT 34.4* 35.0* 34.7* 31.5*  MCV 80.8 81.8 81.5 82.7  PLT 337 307 294 PLATELET CLUMPS NOTED ON SMEAR, UNABLE TO ESTIMATE   Basic Metabolic Panel: Recent Labs  Lab 05/16/20 0748 05/17/20 0400 05/19/20 0651 05/21/20 0322 05/21/20 0847  NA 134* 135 136 138 137  K 4.5 4.7 4.5 5.6* 4.2  CL 106 106 110 116* 112*  CO2 17* 17* 16* 10* 15*  GLUCOSE 104* 76 94 75 113*  BUN 23 24* 18 20 14   CREATININE 0.83 0.84 0.76 0.72 0.69  CALCIUM 10.1 10.2 9.9 10.3 10.2   GFR: Estimated Creatinine Clearance: 49.7 mL/min (by C-G formula  based on SCr of 0.69 mg/dL). Liver Function Tests: Recent Labs  Lab 05/17/20 0400 05/19/20 0651  AST 27 23  ALT 17 22  ALKPHOS 51 54  BILITOT 0.2* 0.4  PROT 8.1 7.8  ALBUMIN 2.9* 2.9*   No results for input(s): LIPASE, AMYLASE in the last 168 hours. No results for input(s): AMMONIA in the last 168 hours. Coagulation Profile: No results for input(s): INR, PROTIME in the last 168 hours. Cardiac Enzymes: No results for input(s): CKTOTAL, CKMB, CKMBINDEX, TROPONINI in the last 168 hours. BNP (last 3 results) No results for input(s): PROBNP in the last 8760 hours. HbA1C: No results for input(s): HGBA1C in the last 72 hours. CBG: No results for input(s): GLUCAP in the last 168 hours. Lipid Profile: No results for input(s): CHOL, HDL, LDLCALC, TRIG, CHOLHDL, LDLDIRECT in the last 72 hours. Thyroid Function Tests: No results for input(s): TSH, T4TOTAL, FREET4, T3FREE, THYROIDAB in the last 72 hours. Anemia Panel: No results for input(s): VITAMINB12, FOLATE, FERRITIN, TIBC, IRON, RETICCTPCT in the last 72 hours. Sepsis Labs: Recent Labs  Lab 05/21/20 0847  LATICACIDVEN 1.8    Recent Results (from the past 240 hour(s))  Culture, Urine     Status: Abnormal (Preliminary result)   Collection Time: 05/18/20 11:18 AM   Specimen: Urine, Random  Result Value Ref Range Status   Specimen Description  URINE, RANDOM  Final   Special Requests NONE  Final   Culture (A)  Final    >=100,000 COLONIES/mL ESCHERICHIA COLI SUSCEPTIBILITIES TO FOLLOW Performed at [facility], [address], [place], [place] [address]    Report Status PENDING  Incomplete         Radiology Studies: No results found.      Scheduled Meds: . aspirin  325 mg Oral Daily  . carbidopa-levodopa  1 tablet Oral TID  . carvedilol  12.5 mg Oral BID WC  . chlorhexidine  15 mL Mouth Rinse BID  . divalproex  125 mg Oral Daily  . donepezil  10 mg Oral Daily  . enoxaparin (LOVENOX) injection  40 mg  Subcutaneous Q24H  . feeding supplement  1 Container Oral BID BM  . feeding supplement (ENSURE ENLIVE)  237 mL Oral BID BM  . hydroxypropyl methylcellulose / hypromellose  1 drop Both Eyes TID  . mouth rinse  15 mL Mouth Rinse q12n4p  . megestrol  400 mg Oral Daily  . multivitamin with minerals  1 tablet Oral Daily  . polyethylene glycol  17 g Oral Daily  . rosuvastatin  10 mg Oral Daily  . senna-docusate  1 tablet Oral BID  . sertraline  100 mg Oral QHS  . sodium chloride flush  3 mL Intravenous Once   Continuous Infusions: . sodium bicarbonate in D5W 1000 mL infusion 75 mL/hr at 05/21/20 1040     LOS: 38 days    Time spent: 35 minutes.     Elmarie Shiley, MD [facility]   If 7PM-7AM, please contact night-coverage [email or web address]  05/21/2020, 3:10 PM

## 2020-05-21 NOTE — Progress Notes (Signed)
Physical Therapy Treatment Patient Details Name: [name] MRN: [id number] DOB: 23-Dec-1955 Today's Date: 05/21/2020    History of Present Illness CHONITA GADEA is a 64 y.o. female with medical history significant of advanced dementia, Parkinson disease, hypertension, hypercalcemia, chronic diastolic CHF, presented with worsening confusion and not eating or drinking x3 days.  Admitted with metabolic encephalopathy and hypernatremia.  Per TOC notes, pt unable to return to ALF due to level of assist, awaiting Medicaid for SNF placement.    PT Comments    The pt was in bed upon arrival of PT, agreeable to session with focus on progression of OOB transfers and exercise to improve pt strength and ROM. The pt continues to demo significant difficulty with both seated and standing stability, requiring modA to maintain upright position in sitting and maxA of 2 to maintain standing position. The pt is making slow progress, but would benefit from continued stretching and mobility to maintain functional ROM and mobility to have decreased caregiver burden with transfers and mobility.     Follow Up Recommendations  SNF     Equipment Recommendations  Hospital bed;Wheelchair (measurements PT)    Recommendations for Other Services       Precautions / Restrictions Precautions Precautions: Fall Restrictions Weight Bearing Restrictions: No    Mobility  Bed Mobility Overal bed mobility: Needs Assistance Bed Mobility: Supine to Sit;Rolling Rolling: Max assist   Supine to sit: Max assist;HOB elevated;+2 for physical assistance     General bed mobility comments: pt needing assist for reaching with either UE to facilitate rolling, modA to finish roll with heavy use of bed rail. maxA to come to sitting EOB  Transfers Overall transfer level: Needs assistance Equipment used: 2 person hand held assist Transfers: Squat Pivot Transfers;Sit to/from Stand Sit to Stand: Max assist;+2 physical  assistance   Squat pivot transfers: Total assist;+2 physical assistance     General transfer comment: maxA to rise from EOB with manual blocking of BLE to maintain feet position when standing. totalA to pivot to recliner  Ambulation/Gait Ambulation/Gait assistance:  (pt unable)           General Gait Details: unable     Balance Overall balance assessment: Needs assistance Sitting-balance support: Feet supported;Bilateral upper extremity supported;No upper extremity supported Sitting balance-Leahy Scale: Poor Sitting balance - Comments: sits with rounded back and shouldersdespite cues for hip flexion, min/modA for stability due to strong posterior lean despite cues. Block feet and knees when sitting upright, noted some sliding of hips Postural control: Posterior lean Standing balance support: Bilateral upper extremity supported Standing balance-Leahy Scale: Zero Standing balance comment: Total A x 2 to maintain standing with blocking of BLE                            Cognition Arousal/Alertness: Awake/alert Behavior During Therapy: Flat affect Overall Cognitive Status: Impaired/Different from baseline Area of Impairment: Following commands;Safety/judgement;Awareness;Problem solving                       Following Commands: Follows one step commands inconsistently;Follows one step commands with increased time Safety/Judgement: Decreased awareness of safety;Decreased awareness of deficits Awareness: Intellectual Problem Solving: Slow processing;Decreased initiation;Difficulty sequencing;Requires verbal cues;Requires tactile cues General Comments: Pt speaking less than usual. Responded only to 1-2 questions through session. limited command following despite increased time      Exercises Other Exercises Other Exercises: postural work with pt sitting in recliner  chair without back support for thoracic and cerivcal extension with shoulder abduction and  external rotation for pec stretch Other Exercises: BIl ankle PF  and HS stretch 30 sec    General Comments        Pertinent Vitals/Pain Pain Assessment: Faces Faces Pain Scale: Hurts a little bit Pain Location: grimace with neck, shoulder and ankle stretches Pain Descriptors / Indicators: Grimacing Pain Intervention(s): Monitored during session;Repositioned           PT Goals (current goals can now be found in the care plan section) Acute Rehab PT Goals Patient Stated Goal: did not state PT Goal Formulation: Patient unable to participate in goal setting Time For Goal Achievement: 05/27/20 Potential to Achieve Goals: Fair Progress towards PT goals: Progressing toward goals (slowly)    Frequency    Min 2X/week      PT Plan Current plan remains appropriate       AM-PAC PT "6 Clicks" Mobility   Outcome Measure  Help needed turning from your back to your side while in a flat bed without using bedrails?: A Lot Help needed moving from lying on your back to sitting on the side of a flat bed without using bedrails?: A Lot Help needed moving to and from a bed to a chair (including a wheelchair)?: Total Help needed standing up from a chair using your arms (e.g., wheelchair or bedside chair)?: A Lot Help needed to walk in hospital room?: Total Help needed climbing 3-5 steps with a railing? : Total 6 Click Score: 9    End of Session Equipment Utilized During Treatment: Gait belt Activity Tolerance: Patient tolerated treatment well Patient left: in chair;with call bell/phone within reach;with chair alarm set Nurse Communication: Mobility status PT Visit Diagnosis: Unsteadiness on feet (R26.81);Other abnormalities of gait and mobility (R26.89);Muscle weakness (generalized) (M62.81);History of falling (Z91.81)     Time: 6979-4801 PT Time Calculation (min) (ACUTE ONLY): 27 min  Charges:  $Gait Training: 8-22 mins $Therapeutic Activity: 8-22 mins                      Karma Ganja, PT, DPT   Acute Rehabilitation Department Pager #: [phone]   Otho Bellows 05/21/2020, 5:11 PM

## 2020-05-22 LAB — BASIC METABOLIC PANEL
Anion gap: 10 (ref 5–15)
BUN: 12 mg/dL (ref 8–23)
CO2: 19 mmol/L — ABNORMAL LOW (ref 22–32)
Calcium: 10.2 mg/dL (ref 8.9–10.3)
Chloride: 107 mmol/L (ref 98–111)
Creatinine, Ser: 0.72 mg/dL (ref 0.44–1.00)
GFR calc Af Amer: >60 mL/min (ref >60)
GFR calc non Af Amer: >60 mL/min (ref >60)
Glucose, Bld: 109 mg/dL — ABNORMAL HIGH (ref 70–99)
Potassium: 3.5 mmol/L (ref 3.5–5.1)
Sodium: 136 mmol/L (ref 135–145)

## 2020-05-22 LAB — URINE CULTURE: Culture: >=100000 — AB

## 2020-05-22 MED ORDER — SODIUM BICARBONATE 650 MG PO TABS
1300.0000 mg | ORAL_TABLET | Freq: Two times a day (BID) | ORAL | Status: DC
  Administered 2020-05-22 – 2020-05-23 (×3): 1300 mg via ORAL
  Filled 2020-05-22 (×3): qty 2

## 2020-05-22 NOTE — Progress Notes (Signed)
PROGRESS NOTE    [name]  KXF:[id number] DOB: [birth year] DOA: 04/13/2020 PCP: Lujean Amel, MD   Brief Narrative: 64 year old with past medical history significant for dementia, Parkinson disease, hypertension, hypercalcemia, chronic diastolic heart failure presented to the hospital with worsening confusion, poor oral intake for 3 days prior to admission.  Patient had stopped taking her medication 3 days prior to admission.  On arrival to the ED, patient was severely dehydrated with sodium 154, chloride 117, calcium 11.2 corrected calcium greater than 12, severe hypoalbuminemia.  CT head showed chronic cortical right parietal lobe infarct, no acute finding.  Patient was admitted with a diagnosis of hypercalcemia and dehydration and metabolic encephalopathy, severe malnutrition and failure to thrive.  Patient was a started on IV fluids and her electrolytes improved.  Patient became more alert and back to baseline however she remains intermittently confused due to her baseline dementia.  PT evaluated patient and is recommending a skilled nursing facility.  PTH and vitamin D was noted to be normal.  SPEP normal, calcium trending up again and was given biphosphonate and calcitonin, PTH RP was negative.  Currently awaiting placement, does not have long-term care insurance. -8/24 with low-grade fever and tachycardia.  Urine culture growing E E. coli.  Started on IV antibiotics.   Assessment & Plan:   Active Problems:   Essential hypertension   Dementia due to Parkinson's disease without behavioral disturbance (HCC)   Dehydration, severe   Dehydration   Acute hypernatremia   Hypercalcemia   Malnutrition of moderate degree   1-Severe dehydration with hypernatremia, adult failure to thrive Resolved with IV fluids. Patient with a background of dementia and Parkinson's disease a risk for dehydration. Encourage oral intake Back on fluids due to metabolic acidosis.   Acute metabolic  encephalopathy: Secondary to hypernatremia, severe hypercalcemia and dehydration.  Improved Extensive work-up for encephalopathy was negative. He does have failure to thrive, dementia and parkinsonism  UTI;Low-grade fever 8/24 Urine culture growing E. Coli Received ceftriaxone for 3 days.  Metabolic acidosis; worsening, unclear etiology, RTA?  Improved with Bicarb Gtt.  Start oral bicarb.   Recent aspiration pneumonia: Secondary to dementia, Parkinson disease and metabolic encephalopathy. Completed course of Unasyn for 1 week  Hypercalcemia: Likely worsening by dehydration PTH was normal vitamin D 34, SPEP without monoclonal protein.  PTH RP less than 2.0. Received 1 dose of Zometa on 8/6 Continue oral intake  Mild hypophosphatemia: Resolved  Anorexia/severe protein caloric malnutrition: Continue Ensure Megace.  Parkinson disease, dementia Continue with Sinemet and donezepil  Thoracic Aortic  aneurysmal: Incidental finding on CT scan.  Need follow-up  Oral thrush: Nystatin . resolved Conjunctivitis resolved treated with Cipro eyedrops.  Palliative care encounter: Patient prognosis is poor patient will want to continue with full scope there. Recommend palliative care to follow-up at SNF  Nutrition Problem: Moderate Malnutrition Etiology: chronic illness (dementia)    Signs/Symptoms: mild muscle depletion, mild fat depletion, moderate fat depletion, moderate muscle depletion, percent weight loss Percent weight loss: 11 %    Interventions: Magic cup, Ensure Enlive (each supplement provides 350kcal and 20 grams of protein)  Estimated body mass index is 23.16 kg/m as calculated from the following:   Height as of this encounter: 5' (1.524 m).   Weight as of this encounter: 53.8 kg.   DVT prophylaxis: Lovenox Code Status: Full code Family Communication: no family at bedside.  Disposition Plan:  Status is: Inpatient  Remains inpatient appropriate because:Unsafe  d/c plan   Dispo:  Patient  From: Assisted Living Facility  Planned Disposition: [facility]  Expected discharge date: 05/11/20  Medically stable for discharge: Yes         Consultants:   None  Procedures:   None  Antimicrobials:    Subjective: She is alert, feels well, no new complaints.   Objective: Vitals:   05/21/20 1700 05/21/20 2216 05/22/20 0447 05/22/20 1029  BP: (!) 142/80 138/81 124/78 (!) 156/85  Pulse: 79 75 71 79  Resp: 18 18 16 18   Temp: 98.5 F (36.9 C) 98.3 F (36.8 C) 98.1 F (36.7 C) 99.3 F (37.4 C)  TempSrc: Oral Oral Oral Oral  SpO2: 99% 98% 96% 98%  Weight:      Height:        Intake/Output Summary (Last 24 hours) at 05/22/2020 1324 Last data filed at 05/22/2020 1029 Gross per 24 hour  Intake 1769.3 ml  Output 1300 ml  Net 469.3 ml   Filed Weights   05/17/20 2027 05/18/20 2043 05/21/20 0459  Weight: 51.3 kg 51.3 kg 53.8 kg    Examination:  General exam: NAD Respiratory system: CTA Cardiovascular system: S 1, S 2 RRR Gastrointestinal system: BS present, soft, nt Central nervous system; alert, answer questions Extremities: No edema  Data Reviewed: I have personally reviewed following labs and imaging studies  CBC: Recent Labs  Lab 05/16/20 0748 05/17/20 0400 05/19/20 0651 05/21/20 0322  WBC 5.2 4.4 3.8* 5.0  HGB 11.0* 11.1* 10.9* 9.8*  HCT 34.4* 35.0* 34.7* 31.5*  MCV 80.8 81.8 81.5 82.7  PLT 337 307 294 PLATELET CLUMPS NOTED ON SMEAR, UNABLE TO ESTIMATE   Basic Metabolic Panel: Recent Labs  Lab 05/17/20 0400 05/19/20 0651 05/21/20 0322 05/21/20 0847 05/22/20 0403  NA 135 136 138 137 136  K 4.7 4.5 5.6* 4.2 3.5  CL 106 110 116* 112* 107  CO2 17* 16* 10* 15* 19*  GLUCOSE 76 94 75 113* 109*  BUN 24* 18 20 14 12   CREATININE 0.84 0.76 0.72 0.69 0.72  CALCIUM 10.2 9.9 10.3 10.2 10.2   GFR: Estimated Creatinine Clearance: 49.7 mL/min (by C-G formula based on SCr of 0.72 mg/dL). Liver Function  Tests: Recent Labs  Lab 05/17/20 0400 05/19/20 0651  AST 27 23  ALT 17 22  ALKPHOS 51 54  BILITOT 0.2* 0.4  PROT 8.1 7.8  ALBUMIN 2.9* 2.9*   No results for input(s): LIPASE, AMYLASE in the last 168 hours. No results for input(s): AMMONIA in the last 168 hours. Coagulation Profile: No results for input(s): INR, PROTIME in the last 168 hours. Cardiac Enzymes: No results for input(s): CKTOTAL, CKMB, CKMBINDEX, TROPONINI in the last 168 hours. BNP (last 3 results) No results for input(s): PROBNP in the last 8760 hours. HbA1C: No results for input(s): HGBA1C in the last 72 hours. CBG: No results for input(s): GLUCAP in the last 168 hours. Lipid Profile: No results for input(s): CHOL, HDL, LDLCALC, TRIG, CHOLHDL, LDLDIRECT in the last 72 hours. Thyroid Function Tests: No results for input(s): TSH, T4TOTAL, FREET4, T3FREE, THYROIDAB in the last 72 hours. Anemia Panel: No results for input(s): VITAMINB12, FOLATE, FERRITIN, TIBC, IRON, RETICCTPCT in the last 72 hours. Sepsis Labs: Recent Labs  Lab 05/21/20 0847  LATICACIDVEN 1.8    Recent Results (from the past 240 hour(s))  Culture, Urine     Status: Abnormal   Collection Time: 05/18/20 11:18 AM   Specimen: Urine, Random  Result Value Ref Range Status   Specimen Description URINE, RANDOM  Final  Special Requests   Final    NONE Performed at [facility], [place] [address] [place], [place] [address]    Culture >=100,000 COLONIES/mL ESCHERICHIA COLI (A)  Final   Report Status 05/22/2020 FINAL  Final   Organism ID, Bacteria ESCHERICHIA COLI (A)  Final      Susceptibility   Escherichia coli - MIC*    AMPICILLIN >=32 RESISTANT Resistant     CEFAZOLIN >=64 RESISTANT Resistant     CEFTRIAXONE 1 SENSITIVE Sensitive     CIPROFLOXACIN <=0.25 SENSITIVE Sensitive     GENTAMICIN <=1 SENSITIVE Sensitive     IMIPENEM <=0.25 SENSITIVE Sensitive     NITROFURANTOIN <=16 SENSITIVE Sensitive     TRIMETH/SULFA <=20 SENSITIVE  Sensitive     AMPICILLIN/SULBACTAM >=32 RESISTANT Resistant     PIP/TAZO 16 SENSITIVE Sensitive     * >=100,000 COLONIES/mL ESCHERICHIA COLI         Radiology Studies: No results found.      Scheduled Meds: . aspirin  325 mg Oral Daily  . carbidopa-levodopa  1 tablet Oral TID  . carvedilol  12.5 mg Oral BID WC  . chlorhexidine  15 mL Mouth Rinse BID  . divalproex  125 mg Oral Daily  . donepezil  10 mg Oral Daily  . enoxaparin (LOVENOX) injection  40 mg Subcutaneous Q24H  . feeding supplement  1 Container Oral BID BM  . feeding supplement (ENSURE ENLIVE)  237 mL Oral BID BM  . hydroxypropyl methylcellulose / hypromellose  1 drop Both Eyes TID  . mouth rinse  15 mL Mouth Rinse q12n4p  . megestrol  400 mg Oral Daily  . multivitamin with minerals  1 tablet Oral Daily  . polyethylene glycol  17 g Oral Daily  . rosuvastatin  10 mg Oral Daily  . senna-docusate  1 tablet Oral BID  . sertraline  100 mg Oral QHS  . sodium bicarbonate  1,300 mg Oral BID  . sodium chloride flush  3 mL Intravenous Once   Continuous Infusions:    LOS: 39 days    Time spent: 35 minutes.     Elmarie Shiley, MD [facility]   If 7PM-7AM, please contact night-coverage [email or web address]  05/22/2020, 1:24 PM

## 2020-05-23 LAB — BASIC METABOLIC PANEL
Anion gap: 12 (ref 5–15)
BUN: 15 mg/dL (ref 8–23)
CO2: 18 mmol/L — ABNORMAL LOW (ref 22–32)
Calcium: 10.3 mg/dL (ref 8.9–10.3)
Chloride: 106 mmol/L (ref 98–111)
Creatinine, Ser: 0.69 mg/dL (ref 0.44–1.00)
GFR calc Af Amer: >60 mL/min (ref >60)
GFR calc non Af Amer: >60 mL/min (ref >60)
Glucose, Bld: 95 mg/dL (ref 70–99)
Potassium: 4.1 mmol/L (ref 3.5–5.1)
Sodium: 136 mmol/L (ref 135–145)

## 2020-05-23 MED ORDER — SODIUM BICARBONATE 650 MG PO TABS
1300.0000 mg | ORAL_TABLET | Freq: Three times a day (TID) | ORAL | Status: DC
  Administered 2020-05-23 – 2020-05-28 (×16): 1300 mg via ORAL
  Filled 2020-05-23 (×17): qty 2

## 2020-05-23 NOTE — Progress Notes (Signed)
PROGRESS NOTE    [name]  TDD:[id number] DOB: September 18, 1956 DOA: 04/13/2020 PCP: Lujean Amel, MD   Brief Narrative: 64 year old with past medical history significant for dementia, Parkinson disease, hypertension, hypercalcemia, chronic diastolic heart failure presented to the hospital with worsening confusion, poor oral intake for 3 days prior to admission.  Patient had stopped taking her medication 3 days prior to admission.  On arrival to the ED, patient was severely dehydrated with sodium 154, chloride 117, calcium 11.2 corrected calcium greater than 12, severe hypoalbuminemia.  CT head showed chronic cortical right parietal lobe infarct, no acute finding.  Patient was admitted with a diagnosis of hypercalcemia and dehydration and metabolic encephalopathy, severe malnutrition and failure to thrive.  Patient was a started on IV fluids and her electrolytes improved.  Patient became more alert and back to baseline however she remains intermittently confused due to her baseline dementia.  PT evaluated patient and is recommending a skilled nursing facility.  PTH and vitamin D was noted to be normal.  SPEP normal, calcium trending up again and was given biphosphonate and calcitonin, PTH RP was negative.  Currently awaiting placement, does not have long-term care insurance. -8/24 with low-grade fever and tachycardia.  Urine culture growing E E. coli.  Started on IV antibiotics.   Assessment & Plan:   Active Problems:   Essential hypertension   Dementia due to Parkinson's disease without behavioral disturbance (HCC)   Dehydration, severe   Dehydration   Acute hypernatremia   Hypercalcemia   Malnutrition of moderate degree   1-Severe dehydration with hypernatremia, adult failure to thrive Resolved with IV fluids. Patient with a background of dementia and Parkinson's disease a risk for dehydration. Encourage oral intake   Acute metabolic encephalopathy: Secondary to hypernatremia,  severe hypercalcemia and dehydration.  Improved Extensive work-up for encephalopathy was negative. She does have failure to thrive, dementia and parkinsonism  UTI;Low-grade fever 8/24 Urine culture growing E. Coli Received ceftriaxone for 3 days.  Metabolic acidosis; worsening, unclear etiology, RTA?  Improved with Bicarb Gtt.  Started  oral bicarb. Increase to TID.   Recent aspiration pneumonia: Secondary to dementia, Parkinson disease and metabolic encephalopathy. Completed course of Unasyn for 1 week  Hypercalcemia: Likely worsening by dehydration PTH was normal vitamin D 34, SPEP without monoclonal protein.  PTH RP less than 2.0. Received 1 dose of Zometa on 8/6 Continue oral intake  Mild hypophosphatemia: Resolved  Anorexia/severe protein caloric malnutrition: Continue Ensure Megace.  Parkinson disease, dementia Continue with Sinemet and donezepil  Thoracic Aortic  aneurysmal: Incidental finding on CT scan.  Need follow-up  Oral thrush: Nystatin . resolved Conjunctivitis resolved treated with Cipro eyedrops.  Palliative care encounter: Patient prognosis is poor patient will want to continue with full scope there. Recommend palliative care to follow-up at SNF  Nutrition Problem: Moderate Malnutrition Etiology: chronic illness (dementia)    Signs/Symptoms: mild muscle depletion, mild fat depletion, moderate fat depletion, moderate muscle depletion, percent weight loss Percent weight loss: 11 %    Interventions: Magic cup, Ensure Enlive (each supplement provides 350kcal and 20 grams of protein)  Estimated body mass index is 23.16 kg/m as calculated from the following:   Height as of this encounter: 5' (1.524 m).   Weight as of this encounter: 53.8 kg.   DVT prophylaxis: Lovenox Code Status: Full code Family Communication: no family at bedside.  Disposition Plan:  Status is: Inpatient  Remains inpatient appropriate because:Unsafe d/c  plan   Dispo:  Patient From: Assisted Living  Facility  Planned Disposition: [place] [facility]  Expected discharge date: 05/11/20  Medically stable for discharge: Yes         Consultants:   None  Procedures:   None  Antimicrobials:    Subjective: Alert, no new complaint Objective: Vitals:   05/22/20 1751 05/22/20 2054 05/23/20 0627 05/23/20 1107  BP: 132/90 117/82 127/86 121/75  Pulse: 65 76 63 68  Resp: 18 19 17 19   Temp: 99.2 F (37.3 C) 99.8 F (37.7 C) 99 F (37.2 C) 99 F (37.2 C)  TempSrc: Oral Oral Oral   SpO2: 99% 97% 97% 97%  Weight:      Height:        Intake/Output Summary (Last 24 hours) at 05/23/2020 1228 Last data filed at 05/23/2020 1100 Gross per 24 hour  Intake 600 ml  Output 1450 ml  Net -850 ml   Filed Weights   05/17/20 2027 05/18/20 2043 05/21/20 0459  Weight: 51.3 kg 51.3 kg 53.8 kg    Examination:  General exam: NAD Respiratory system: CTA Cardiovascular system: S1, S 2 RRR Gastrointestinal system: BS present, soft, nt Central nervous system; alert Extremities: No edema  Data Reviewed: I have personally reviewed following labs and imaging studies  CBC: Recent Labs  Lab 05/17/20 0400 05/19/20 0651 05/21/20 0322  WBC 4.4 3.8* 5.0  HGB 11.1* 10.9* 9.8*  HCT 35.0* 34.7* 31.5*  MCV 81.8 81.5 82.7  PLT 307 294 PLATELET CLUMPS NOTED ON SMEAR, UNABLE TO ESTIMATE   Basic Metabolic Panel: Recent Labs  Lab 05/19/20 0651 05/21/20 0322 05/21/20 0847 05/22/20 0403 05/23/20 0829  NA 136 138 137 136 136  K 4.5 5.6* 4.2 3.5 4.1  CL 110 116* 112* 107 106  CO2 16* 10* 15* 19* 18*  GLUCOSE 94 75 113* 109* 95  BUN 18 20 14 12 15   CREATININE 0.76 0.72 0.69 0.72 0.69  CALCIUM 9.9 10.3 10.2 10.2 10.3   GFR: Estimated Creatinine Clearance: 49.7 mL/min (by C-G formula based on SCr of 0.69 mg/dL). Liver Function Tests: Recent Labs  Lab 05/17/20 0400 05/19/20 0651  AST 27 23  ALT 17 22  ALKPHOS 51 54  BILITOT  0.2* 0.4  PROT 8.1 7.8  ALBUMIN 2.9* 2.9*   No results for input(s): LIPASE, AMYLASE in the last 168 hours. No results for input(s): AMMONIA in the last 168 hours. Coagulation Profile: No results for input(s): INR, PROTIME in the last 168 hours. Cardiac Enzymes: No results for input(s): CKTOTAL, CKMB, CKMBINDEX, TROPONINI in the last 168 hours. BNP (last 3 results) No results for input(s): PROBNP in the last 8760 hours. HbA1C: No results for input(s): HGBA1C in the last 72 hours. CBG: No results for input(s): GLUCAP in the last 168 hours. Lipid Profile: No results for input(s): CHOL, HDL, LDLCALC, TRIG, CHOLHDL, LDLDIRECT in the last 72 hours. Thyroid Function Tests: No results for input(s): TSH, T4TOTAL, FREET4, T3FREE, THYROIDAB in the last 72 hours. Anemia Panel: No results for input(s): VITAMINB12, FOLATE, FERRITIN, TIBC, IRON, RETICCTPCT in the last 72 hours. Sepsis Labs: Recent Labs  Lab 05/21/20 0847  LATICACIDVEN 1.8    Recent Results (from the past 240 hour(s))  Culture, Urine     Status: Abnormal   Collection Time: 05/18/20 11:18 AM   Specimen: Urine, Random  Result Value Ref Range Status   Specimen Description URINE, RANDOM  Final   Special Requests   Final    NONE Performed at [facility], [address],  [place] [address]    Culture >=100,000 COLONIES/mL ESCHERICHIA COLI (A)  Final   Report Status 05/22/2020 FINAL  Final   Organism ID, Bacteria ESCHERICHIA COLI (A)  Final      Susceptibility   Escherichia coli - MIC*    AMPICILLIN >=32 RESISTANT Resistant     CEFAZOLIN >=64 RESISTANT Resistant     CEFTRIAXONE 1 SENSITIVE Sensitive     CIPROFLOXACIN <=0.25 SENSITIVE Sensitive     GENTAMICIN <=1 SENSITIVE Sensitive     IMIPENEM <=0.25 SENSITIVE Sensitive     NITROFURANTOIN <=16 SENSITIVE Sensitive     TRIMETH/SULFA <=20 SENSITIVE Sensitive     AMPICILLIN/SULBACTAM >=32 RESISTANT Resistant     PIP/TAZO 16 SENSITIVE Sensitive     *  >=100,000 COLONIES/mL ESCHERICHIA COLI         Radiology Studies: No results found.      Scheduled Meds: . aspirin  325 mg Oral Daily  . carbidopa-levodopa  1 tablet Oral TID  . carvedilol  12.5 mg Oral BID WC  . chlorhexidine  15 mL Mouth Rinse BID  . divalproex  125 mg Oral Daily  . donepezil  10 mg Oral Daily  . enoxaparin (LOVENOX) injection  40 mg Subcutaneous Q24H  . feeding supplement  1 Container Oral BID BM  . feeding supplement (ENSURE ENLIVE)  237 mL Oral BID BM  . hydroxypropyl methylcellulose / hypromellose  1 drop Both Eyes TID  . mouth rinse  15 mL Mouth Rinse q12n4p  . megestrol  400 mg Oral Daily  . multivitamin with minerals  1 tablet Oral Daily  . polyethylene glycol  17 g Oral Daily  . rosuvastatin  10 mg Oral Daily  . senna-docusate  1 tablet Oral BID  . sertraline  100 mg Oral QHS  . sodium bicarbonate  1,300 mg Oral TID  . sodium chloride flush  3 mL Intravenous Once   Continuous Infusions:    LOS: 40 days    Time spent: 35 minutes.     Elmarie Shiley, MD [facility]   If 7PM-7AM, please contact night-coverage [email or web address]  05/23/2020, 12:28 PM

## 2020-05-24 LAB — BASIC METABOLIC PANEL
Anion gap: 11 (ref 5–15)
BUN: 15 mg/dL (ref 8–23)
CO2: 19 mmol/L — ABNORMAL LOW (ref 22–32)
Calcium: 10.3 mg/dL (ref 8.9–10.3)
Chloride: 109 mmol/L (ref 98–111)
Creatinine, Ser: 0.79 mg/dL (ref 0.44–1.00)
GFR calc Af Amer: >60 mL/min (ref >60)
GFR calc non Af Amer: >60 mL/min (ref >60)
Glucose, Bld: 95 mg/dL (ref 70–99)
Potassium: 4.5 mmol/L (ref 3.5–5.1)
Sodium: 139 mmol/L (ref 135–145)

## 2020-05-24 NOTE — Progress Notes (Signed)
PROGRESS NOTE    [name]  DJT:[id number] DOB: 1956-08-30 DOA: 04/13/2020 PCP: Lujean Amel, MD   Brief Narrative: 64 year old with past medical history significant for dementia, Parkinson disease, hypertension, hypercalcemia, chronic diastolic heart failure presented to the hospital with worsening confusion, poor oral intake for 3 days prior to admission.  Patient had stopped taking her medication 3 days prior to admission.  On arrival to the ED, patient was severely dehydrated with sodium 154, chloride 117, calcium 11.2 corrected calcium greater than 12, severe hypoalbuminemia.  CT head showed chronic cortical right parietal lobe infarct, no acute finding.  Patient was admitted with a diagnosis of hypercalcemia and dehydration and metabolic encephalopathy, severe malnutrition and failure to thrive.  Patient was a started on IV fluids and her electrolytes improved.  Patient became more alert and back to baseline however she remains intermittently confused due to her baseline dementia.  PT evaluated patient and is recommending a skilled nursing facility.  PTH and vitamin D was noted to be normal.  SPEP normal, calcium trending up again and was given biphosphonate and calcitonin, PTH RP was negative.  Currently awaiting placement, does not have long-term care insurance. -8/24 with low-grade fever and tachycardia.  Urine culture growing E E. coli.  Started on IV antibiotics.   Assessment & Plan:   Active Problems:   Essential hypertension   Dementia due to Parkinson's disease without behavioral disturbance (HCC)   Dehydration, severe   Dehydration   Acute hypernatremia   Hypercalcemia   Malnutrition of moderate degree   1-Severe dehydration with hypernatremia, adult failure to thrive Resolved with IV fluids. Patient with a background of dementia and Parkinson's disease a risk for dehydration. Encourage oral intake   Acute metabolic encephalopathy: Secondary to hypernatremia,  severe hypercalcemia and dehydration.  Improved Extensive work-up for encephalopathy was negative. She does have failure to thrive, dementia and parkinsonism  UTI;Low-grade fever 8/24 Urine culture growing E. Coli Received ceftriaxone for 3 days.  Metabolic acidosis; worsening, unclear etiology, RTA?  Improved with Bicarb Gtt.  Started  oral bicarb. Increase to TID.  Imp[roved/.   Recent aspiration pneumonia: Secondary to dementia, Parkinson disease and metabolic encephalopathy. Completed course of Unasyn for 1 week  Hypercalcemia: Likely worsening by dehydration PTH was normal vitamin D 34, SPEP without monoclonal protein.  PTH RP less than 2.0. Received 1 dose of Zometa on 8/6 Continue oral intake  Mild hypophosphatemia: Resolved  Anorexia/severe protein caloric malnutrition: Continue Ensure Megace.  Parkinson disease, dementia Continue with Sinemet and donezepil  Thoracic Aortic  aneurysmal: Incidental finding on CT scan.  Need follow-up  Oral thrush: Nystatin . resolved Conjunctivitis resolved treated with Cipro eyedrops.  Palliative care encounter: Patient prognosis is poor patient will want to continue with full scope there. Recommend palliative care to follow-up at SNF  Nutrition Problem: Moderate Malnutrition Etiology: chronic illness (dementia)    Signs/Symptoms: mild muscle depletion, mild fat depletion, moderate fat depletion, moderate muscle depletion, percent weight loss Percent weight loss: 11 %    Interventions: Magic cup, Ensure Enlive (each supplement provides 350kcal and 20 grams of protein)  Estimated body mass index is 23.16 kg/m as calculated from the following:   Height as of this encounter: 5' (1.524 m).   Weight as of this encounter: 53.8 kg.   DVT prophylaxis: Lovenox Code Status: Full code Family Communication: no family at bedside.  Disposition Plan:  Status is: Inpatient  Remains inpatient appropriate because:Unsafe d/c  plan   Dispo:  Patient From:  Planned Disposition:    Expected discharge date:    Medically stable for discharge:           Consultants:   None  Procedures:   None  Antimicrobials:    Subjective: Alert, feels well, denies SOB.  No new complaints.   Objective: Vitals:   05/23/20 1819 05/23/20 2123 05/24/20 0505 05/24/20 0830  BP: 116/80 103/80 (!) 165/86 (!) 144/80  Pulse: 98 66 60 88  Resp: 18 15 15 16   Temp: 99.2 F (37.3 C) 98.4 F (36.9 C) 98.4 F (36.9 C) 98 F (36.7 C)  TempSrc:    Oral  SpO2: 98% 100% 100% 98%  Weight:      Height:        Intake/Output Summary (Last 24 hours) at 05/24/2020 1345 Last data filed at 05/24/2020 0857 Gross per 24 hour  Intake 620 ml  Output 1400 ml  Net -780 ml   Filed Weights   05/17/20 2027 05/18/20 2043 05/21/20 0459  Weight: 51.3 kg 51.3 kg 53.8 kg    Examination:  General exam: NAD Respiratory system: CTA Cardiovascular system: S 1, S 2 RRR Gastrointestinal system: BS present, soft, nt Central nervous system; ALert Extremities: No edema  Data Reviewed: I have personally reviewed following labs and imaging studies  CBC: Recent Labs  Lab 05/19/20 0651 05/21/20 0322  WBC 3.8* 5.0  HGB 10.9* 9.8*  HCT 34.7* 31.5*  MCV 81.5 82.7  PLT 294 PLATELET CLUMPS NOTED ON SMEAR, UNABLE TO ESTIMATE   Basic Metabolic Panel: Recent Labs  Lab 05/21/20 0322 05/21/20 0847 05/22/20 0403 05/23/20 0829 05/24/20 0810  NA 138 137 136 136 139  K 5.6* 4.2 3.5 4.1 4.5  CL 116* 112* 107 106 109  CO2 10* 15* 19* 18* 19*  GLUCOSE 75 113* 109* 95 95  BUN 20 14 12 15 15   CREATININE 0.72 0.69 0.72 0.69 0.79  CALCIUM 10.3 10.2 10.2 10.3 10.3   GFR: Estimated Creatinine Clearance: 49.7 mL/min (by C-G formula based on SCr of 0.79 mg/dL). Liver Function Tests: Recent Labs  Lab 05/19/20 0651  AST 23  ALT 22  ALKPHOS 54  BILITOT 0.4  PROT 7.8  ALBUMIN 2.9*   No results for input(s): LIPASE, AMYLASE in the  last 168 hours. No results for input(s): AMMONIA in the last 168 hours. Coagulation Profile: No results for input(s): INR, PROTIME in the last 168 hours. Cardiac Enzymes: No results for input(s): CKTOTAL, CKMB, CKMBINDEX, TROPONINI in the last 168 hours. BNP (last 3 results) No results for input(s): PROBNP in the last 8760 hours. HbA1C: No results for input(s): HGBA1C in the last 72 hours. CBG: No results for input(s): GLUCAP in the last 168 hours. Lipid Profile: No results for input(s): CHOL, HDL, LDLCALC, TRIG, CHOLHDL, LDLDIRECT in the last 72 hours. Thyroid Function Tests: No results for input(s): TSH, T4TOTAL, FREET4, T3FREE, THYROIDAB in the last 72 hours. Anemia Panel: No results for input(s): VITAMINB12, FOLATE, FERRITIN, TIBC, IRON, RETICCTPCT in the last 72 hours. Sepsis Labs: Recent Labs  Lab 05/21/20 0847  LATICACIDVEN 1.8    Recent Results (from the past 240 hour(s))  Culture, Urine     Status: Abnormal   Collection Time: 05/18/20 11:18 AM   Specimen: Urine, Random  Result Value Ref Range Status   Specimen Description URINE, RANDOM  Final   Special Requests   Final    NONE Performed at [facility], [address] [place], [place] [address]    Culture >=  100,000 COLONIES/mL ESCHERICHIA COLI (A)  Final   Report Status 05/22/2020 FINAL  Final   Organism ID, Bacteria ESCHERICHIA COLI (A)  Final      Susceptibility   Escherichia coli - MIC*    AMPICILLIN >=32 RESISTANT Resistant     CEFAZOLIN >=64 RESISTANT Resistant     CEFTRIAXONE 1 SENSITIVE Sensitive     CIPROFLOXACIN <=0.25 SENSITIVE Sensitive     GENTAMICIN <=1 SENSITIVE Sensitive     IMIPENEM <=0.25 SENSITIVE Sensitive     NITROFURANTOIN <=16 SENSITIVE Sensitive     TRIMETH/SULFA <=20 SENSITIVE Sensitive     AMPICILLIN/SULBACTAM >=32 RESISTANT Resistant     PIP/TAZO 16 SENSITIVE Sensitive     * >=100,000 COLONIES/mL ESCHERICHIA COLI         Radiology Studies: No results  found.      Scheduled Meds: . aspirin  325 mg Oral Daily  . carbidopa-levodopa  1 tablet Oral TID  . carvedilol  12.5 mg Oral BID WC  . chlorhexidine  15 mL Mouth Rinse BID  . divalproex  125 mg Oral Daily  . donepezil  10 mg Oral Daily  . enoxaparin (LOVENOX) injection  40 mg Subcutaneous Q24H  . feeding supplement  1 Container Oral BID BM  . feeding supplement (ENSURE ENLIVE)  237 mL Oral BID BM  . hydroxypropyl methylcellulose / hypromellose  1 drop Both Eyes TID  . mouth rinse  15 mL Mouth Rinse q12n4p  . megestrol  400 mg Oral Daily  . multivitamin with minerals  1 tablet Oral Daily  . polyethylene glycol  17 g Oral Daily  . rosuvastatin  10 mg Oral Daily  . senna-docusate  1 tablet Oral BID  . sertraline  100 mg Oral QHS  . sodium bicarbonate  1,300 mg Oral TID  . sodium chloride flush  3 mL Intravenous Once   Continuous Infusions:    LOS: 41 days    Time spent: 35 minutes.     Elmarie Shiley, MD [facility]   If 7PM-7AM, please contact night-coverage [email or web address]  05/24/2020, 1:45 PM

## 2020-05-24 NOTE — Consult Note (Signed)
   [facility] Inpatient Consult   05/24/2020  KAREMA TOCCI 1956/01/28 [id number]   [employer] [[employer]] Patient: [employer] CSNP plan, ongoing following active member  Patient is awaiting long term care placement as per medical record review and awaiting Medicaid. Patient will receive a post hospital call and will be evaluated for assessments and disease process education.  Patient is up in geri chair on rounds.  Plan:  Update [employer] CSNP RN Care Coordinator of any changes in disposition.   Of note, [facility] Care Management services does not replace or interfere with any services that are needed or arranged by inpatient [facility] care management team.  For additional questions or referrals please contact:  Natividad Brood, RN BSN [facility] Liaison  [phone] business mobile phone Toll free office [phone]  Fax number: [phone] [email or web address]

## 2020-05-24 NOTE — Progress Notes (Signed)
Physical Therapy Treatment Patient Details Name: [name] MRN: [id number] DOB: [birth year] Today's Date: 05/24/2020    History of Present Illness [name] is a 64 y.o. female with medical history significant of advanced dementia, Parkinson disease, hypertension, hypercalcemia, chronic diastolic CHF, presented with worsening confusion and not eating or drinking x3 days.  Admitted with metabolic encephalopathy and hypernatremia.  Per TOC notes, pt unable to return to ALF due to level of assist, awaiting Medicaid for SNF placement.    PT Comments    Pt in bed upon arrival of PT, agreeable to session with focus on OOB transfers and functional stretching and AAROM to maintain mobility. The pt was able to complete multiple transfers (bed-BSC-recliner) with maxA to stand due to poor movement of hips/trunk, and legs to stand. The pt requires maxA to rise up and maxA to facilitate hip/trunk flexion for returning to chair. The pt did engage more verbally in today's session, and initiated movement with BLE and her trunk for initial transition to sitting EOB. The pt will continue to benefit from skilled PT to progress functional mobility and capacity for transfers, as well as for continued exercises and stretching to maintain ROM.     Follow Up Recommendations  SNF     Equipment Recommendations  Hospital bed;Wheelchair (measurements PT)    Recommendations for Other Services       Precautions / Restrictions Precautions Precautions: Fall Restrictions Weight Bearing Restrictions: No    Mobility  Bed Mobility Overal bed mobility: Needs Assistance Bed Mobility: Supine to Sit;Rolling Rolling: Mod assist Sidelying to sit: Max assist;+2 for physical assistance       General bed mobility comments: pt did initiate trunk elevation and movement of BLE today, needed maxA to compelte all movements. bed pad to complete scooting at EOB  Transfers Overall transfer level: Needs  assistance Equipment used: 2 person hand held assist Transfers: Squat Pivot Transfers;Sit to/from Stand Sit to Stand: Max assist;+2 physical assistance Stand pivot transfers: Max assist;+2 physical assistance       General transfer comment: maxA to rise from various surfaces (bed, BSC, recliner). poor hip flexion to generate stand or sit, at initiation of movement, the pt locks into full body ext and requires sig assist to complete transfer.  Ambulation/Gait             General Gait Details: unable   Stairs             Wheelchair Mobility    Modified Rankin (Stroke Patients Only)       Balance Overall balance assessment: Needs assistance Sitting-balance support: Feet supported;Bilateral upper extremity supported;No upper extremity supported Sitting balance-Leahy Scale: Poor Sitting balance - Comments: sits with rounded back and shoulders despite cues for hip flexion, min/modA for stability due to strong posterior lean despite cues. Block feet and knees when sitting upright, noted some sliding of hips   Standing balance support: Bilateral upper extremity supported Standing balance-Leahy Scale: Zero Standing balance comment: Total A x 2 to maintain standing with blocking of BLE                            Cognition Arousal/Alertness: Awake/alert Behavior During Therapy: Flat affect Overall Cognitive Status: Impaired/Different from baseline Area of Impairment: Following commands;Safety/judgement;Awareness;Problem solving                       Following Commands: Follows one step commands inconsistently;Follows one step  commands with increased time Safety/Judgement: Decreased awareness of safety;Decreased awareness of deficits Awareness: Intellectual Problem Solving: Slow processing;Decreased initiation;Difficulty sequencing;Requires verbal cues;Requires tactile cues General Comments: Pt with increased engagement and verbal response through  today's session. significantly slowed processing but does attempt to follow commands with increased time and multimodal cues.      Exercises General Exercises - Lower Extremity Ankle Circles/Pumps: AROM;Both;10 reps;Supine Other Exercises Other Exercises: postural work with pt sitting in recliner chair without back support for thoracic and cerivcal extension with shoulder abduction and external rotation for pec stretch Other Exercises: BIl ankle PF  and HS stretch 30 sec    General Comments        Pertinent Vitals/Pain Pain Assessment: Faces Faces Pain Scale: Hurts a little bit Pain Location: grimace with neck, shoulder and ankle stretches Pain Descriptors / Indicators: Grimacing Pain Intervention(s): Limited activity within patient's tolerance;Monitored during session;Repositioned           PT Goals (current goals can now be found in the care plan section) Acute Rehab PT Goals Patient Stated Goal: did not state PT Goal Formulation: Patient unable to participate in goal setting Time For Goal Achievement: 05/27/20 Potential to Achieve Goals: Fair Progress towards PT goals: Progressing toward goals (slowly)    Frequency    Min 2X/week      PT Plan Current plan remains appropriate       AM-PAC PT "6 Clicks" Mobility   Outcome Measure  Help needed turning from your back to your side while in a flat bed without using bedrails?: A Lot Help needed moving from lying on your back to sitting on the side of a flat bed without using bedrails?: A Lot Help needed moving to and from a bed to a chair (including a wheelchair)?: Total Help needed standing up from a chair using your arms (e.g., wheelchair or bedside chair)?: A Lot Help needed to walk in hospital room?: Total Help needed climbing 3-5 steps with a railing? : Total 6 Click Score: 9    End of Session Equipment Utilized During Treatment: Gait belt Activity Tolerance: Patient tolerated treatment well Patient left: in  chair;with call bell/phone within reach;with chair alarm set Nurse Communication: Mobility status PT Visit Diagnosis: Unsteadiness on feet (R26.81);Other abnormalities of gait and mobility (R26.89);Muscle weakness (generalized) (M62.81);History of falling (Z91.81)     Time: 5102-5852 PT Time Calculation (min) (ACUTE ONLY): 31 min  Charges:  $Therapeutic Exercise: 23-37 mins                     Karma Ganja, PT, DPT   Acute Rehabilitation Department Pager #: [phone]   Otho Bellows 05/24/2020, 1:20 PM

## 2020-05-25 LAB — CBC
HCT: 31.9 % — ABNORMAL LOW (ref 36.0–46.0)
Hemoglobin: 10.2 g/dL — ABNORMAL LOW (ref 12.0–15.0)
MCH: 26.2 pg (ref 26.0–34.0)
MCHC: 32 g/dL (ref 30.0–36.0)
MCV: 82 fL (ref 80.0–100.0)
Platelets: 296 10*3/uL (ref 150–400)
RBC: 3.89 MIL/uL (ref 3.87–5.11)
RDW: 19.7 % — ABNORMAL HIGH (ref 11.5–15.5)
WBC: 3.7 10*3/uL — ABNORMAL LOW (ref 4.0–10.5)
nRBC: 0 % (ref 0.0–0.2)

## 2020-05-25 LAB — BASIC METABOLIC PANEL
Anion gap: 11 (ref 5–15)
BUN: 17 mg/dL (ref 8–23)
CO2: 20 mmol/L — ABNORMAL LOW (ref 22–32)
Calcium: 9.8 mg/dL (ref 8.9–10.3)
Chloride: 106 mmol/L (ref 98–111)
Creatinine, Ser: 0.86 mg/dL (ref 0.44–1.00)
GFR calc Af Amer: >60 mL/min (ref >60)
GFR calc non Af Amer: >60 mL/min (ref >60)
Glucose, Bld: 95 mg/dL (ref 70–99)
Potassium: 3.5 mmol/L (ref 3.5–5.1)
Sodium: 137 mmol/L (ref 135–145)

## 2020-05-25 MED ORDER — ENSURE ENLIVE PO LIQD
237.0000 mL | Freq: Three times a day (TID) | ORAL | Status: DC
  Administered 2020-05-25 – 2020-05-28 (×10): 237 mL via ORAL
  Filled 2020-05-25: qty 237

## 2020-05-25 NOTE — Progress Notes (Signed)
Nutrition Follow-up  DOCUMENTATION CODES:   Non-severe (moderate) malnutrition in context of chronic illness  INTERVENTION:  Recommend initiation of bowel regimen, no BM documented since 8/25   Continue to encourage adequate PO intake  Increase to Ensure Enlive po TID, each supplement provides 350 kcal and 20 grams of protein  D/c Boost Breeze  Continue Magic cup TID with meals, each supplement provides 290 kcal and 9 grams of protein   NUTRITION DIAGNOSIS:   Moderate Malnutrition related to chronic illness (dementia) as evidenced by mild muscle depletion, mild fat depletion, moderate fat depletion, moderate muscle depletion, percent weight loss.  Ongoing  GOAL:   Patient will meet greater than or equal to 90% of their needs  Progressing  MONITOR:   PO intake, Supplement acceptance, Weight trends, Labs, I & O's  REASON FOR ASSESSMENT:   Consult Assessment of nutrition requirement/status  ASSESSMENT:   Pt admitted with severe dehydration and hypernatremia and acute encephalopathy. PMH includes advanced dementia, Parkinson disease, HTN, hypercalcemia, CHF.  Pt continues to await Medicaid for long-term placement.   Discussed pt with RN who states pt does not like Boost Breeze, but is drinking Ensure in its entirety. Will d/c Boost and increase frequency of Ensure Enlive.   PO Intake: 0-90% x last 8 recorded meals (~34% average meal intake)  Labs reviewed. Medications: Boost Breeze BID, Ensure Enlive BID, Megace, MVI, Miralax, Senokot-S, Sodium Bicarbonate  Diet Order:   Diet Order            DIET DYS 3 Room service appropriate? Yes; Fluid consistency: Thin  Diet effective now                 EDUCATION NEEDS:   No education needs have been identified at this time  Skin:  Skin Assessment: Skin Integrity Issues: Skin Integrity Issues:: Other (Comment) Other: MASD buttocks  Last BM:  (P) 8/25  Height:   Ht Readings from Last 1 Encounters:   05/01/20 5' (1.524 m)    Weight:   Wt Readings from Last 1 Encounters:  05/21/20 53.8 kg    BMI:  Body mass index is 23.16 kg/m.  Estimated Nutritional Needs:   Kcal:  1400-1600  Protein:  70-85 grams  Fluid:  >1.4L/d    Larkin Ina, MS, RD, LDN RD pager number and weekend/on-call pager number located in [place].

## 2020-05-25 NOTE — Progress Notes (Signed)
PROGRESS NOTE    [name]  WEX:[id number] DOB: 23-Mar-1956 DOA: 04/13/2020 PCP: Lujean Amel, MD   Brief Narrative: 64 year old with past medical history significant for dementia, Parkinson disease, hypertension, hypercalcemia, chronic diastolic heart failure presented to the hospital with worsening confusion, poor oral intake for 3 days prior to admission.  Patient had stopped taking her medication 3 days prior to admission.  On arrival to the ED, patient was severely dehydrated with sodium 154, chloride 117, calcium 11.2 corrected calcium greater than 12, severe hypoalbuminemia.  CT head showed chronic cortical right parietal lobe infarct, no acute finding.  Patient was admitted with a diagnosis of hypercalcemia and dehydration and metabolic encephalopathy, severe malnutrition and failure to thrive.  Patient was a started on IV fluids and her electrolytes improved.  Patient became more alert and back to baseline however she remains intermittently confused due to her baseline dementia.  PT evaluated patient and is recommending a skilled nursing facility.  PTH and vitamin D was noted to be normal.  SPEP normal, calcium trending up again and was given biphosphonate and calcitonin, PTH RP was negative.  Currently awaiting placement, does not have long-term care insurance. -8/24 with low-grade fever and tachycardia.  Urine culture growing E E. coli.  Started on IV antibiotics. Completed 3 days ceftriaxone. Develop metabolic acidosis on  9/38 bicarb down to 10. Lactic acid normal. Mild elevation of potassium. Treated with bicarb gtt, transition to oral Bicarb tablet. Metabolic acidosis resolved.   Awaiting placement.   Assessment & Plan:   Active Problems:   Essential hypertension   Dementia due to Parkinson's disease without behavioral disturbance (HCC)   Dehydration, severe   Dehydration   Acute hypernatremia   Hypercalcemia   Malnutrition of moderate degree   1-Severe dehydration with  hypernatremia, adult failure to thrive Resolved with IV fluids. Patient with a background of dementia and Parkinson's disease a risk for dehydration. Encourage oral intake   Acute metabolic encephalopathy: Secondary to hypernatremia, severe hypercalcemia and dehydration.  Improved Extensive work-up for encephalopathy was negative. She does have failure to thrive, dementia and parkinsonism Stable, alert, answer questions.   UTI;Low-grade fever 8/24 Urine culture growing E. Coli Received ceftriaxone for 3 days.  Metabolic acidosis; worsening, unclear etiology, RTA?  Improved with Bicarb Gtt.  Started  oral bicarb. Increase to TID.  Improved/.   Recent aspiration pneumonia: Secondary to dementia, Parkinson disease and metabolic encephalopathy. Completed course of Unasyn for 1 week  Hypercalcemia: Likely worsening by dehydration PTH was normal vitamin D 34, SPEP without monoclonal protein.  PTH RP less than 2.0. Received 1 dose of Zometa on 8/6 Continue oral intake  Mild hypophosphatemia: Resolved  Anorexia/severe protein caloric malnutrition: Continue Ensure Megace.  Parkinson disease, dementia Continue with Sinemet and donezepil  Thoracic Aortic  aneurysmal: Incidental finding on CT scan.  Need follow-up  Oral thrush: Nystatin . resolved Conjunctivitis resolved treated with Cipro eyedrops.  Palliative care encounter: Patient prognosis is poor patient will want to continue with full scope there. Recommend palliative care to follow-up at SNF  Nutrition Problem: Moderate Malnutrition Etiology: chronic illness (dementia)    Signs/Symptoms: mild muscle depletion, mild fat depletion, moderate fat depletion, moderate muscle depletion, percent weight loss Percent weight loss: 11 %    Interventions: Magic cup, Ensure Enlive (each supplement provides 350kcal and 20 grams of protein)  Estimated body mass index is 23.16 kg/m as calculated from the following:   Height as  of this encounter: 5' (1.524 m).  Weight as of this encounter: 53.8 kg.   DVT prophylaxis: Lovenox Code Status: Full code Family Communication: no family at bedside.  Disposition Plan:  Status is: Inpatient  Remains inpatient appropriate because:Unsafe d/c plan   Dispo:  Patient From:    Planned Disposition:    Expected discharge date:    Medically stable for discharge:           Consultants:   None  Procedures:   None  Antimicrobials:    Subjective: Patient alert, no new complaints. Feels well.   Objective: Vitals:   05/24/20 1630 05/24/20 2110 05/25/20 0515 05/25/20 0851  BP: 133/76 117/76 138/81 106/82  Pulse: 77 64 69 65  Resp: 18 17 17 16   Temp: 98 F (36.7 C) 98.4 F (36.9 C) 98 F (36.7 C) 99 F (37.2 C)  TempSrc: Oral Oral Oral Oral  SpO2: 100% 97% 97% 99%  Weight:      Height:        Intake/Output Summary (Last 24 hours) at 05/25/2020 1518 Last data filed at 05/25/2020 1300 Gross per 24 hour  Intake 580 ml  Output 300 ml  Net 280 ml   Filed Weights   05/17/20 2027 05/18/20 2043 05/21/20 0459  Weight: 51.3 kg 51.3 kg 53.8 kg    Examination:  General exam: NAD. frail Respiratory system: CTA Cardiovascular system: S 1, S 2 RRR Gastrointestinal system: BS present, soft, nt Central nervous system; ALert Extremities: No edema  Data Reviewed: I have personally reviewed following labs and imaging studies  CBC: Recent Labs  Lab 05/19/20 0651 05/21/20 0322 05/25/20 0508  WBC 3.8* 5.0 3.7*  HGB 10.9* 9.8* 10.2*  HCT 34.7* 31.5* 31.9*  MCV 81.5 82.7 82.0  PLT 294 PLATELET CLUMPS NOTED ON SMEAR, UNABLE TO ESTIMATE 045   Basic Metabolic Panel: Recent Labs  Lab 05/21/20 0847 05/22/20 0403 05/23/20 0829 05/24/20 0810 05/25/20 0508  NA 137 136 136 139 137  K 4.2 3.5 4.1 4.5 3.5  CL 112* 107 106 109 106  CO2 15* 19* 18* 19* 20*  GLUCOSE 113* 109* 95 95 95  BUN 14 12 15 15 17   CREATININE 0.69 0.72 0.69 0.79 0.86  CALCIUM  10.2 10.2 10.3 10.3 9.8   GFR: Estimated Creatinine Clearance: 46.2 mL/min (by C-G formula based on SCr of 0.86 mg/dL). Liver Function Tests: Recent Labs  Lab 05/19/20 0651  AST 23  ALT 22  ALKPHOS 54  BILITOT 0.4  PROT 7.8  ALBUMIN 2.9*   No results for input(s): LIPASE, AMYLASE in the last 168 hours. No results for input(s): AMMONIA in the last 168 hours. Coagulation Profile: No results for input(s): INR, PROTIME in the last 168 hours. Cardiac Enzymes: No results for input(s): CKTOTAL, CKMB, CKMBINDEX, TROPONINI in the last 168 hours. BNP (last 3 results) No results for input(s): PROBNP in the last 8760 hours. HbA1C: No results for input(s): HGBA1C in the last 72 hours. CBG: No results for input(s): GLUCAP in the last 168 hours. Lipid Profile: No results for input(s): CHOL, HDL, LDLCALC, TRIG, CHOLHDL, LDLDIRECT in the last 72 hours. Thyroid Function Tests: No results for input(s): TSH, T4TOTAL, FREET4, T3FREE, THYROIDAB in the last 72 hours. Anemia Panel: No results for input(s): VITAMINB12, FOLATE, FERRITIN, TIBC, IRON, RETICCTPCT in the last 72 hours. Sepsis Labs: Recent Labs  Lab 05/21/20 0847  LATICACIDVEN 1.8    Recent Results (from the past 240 hour(s))  Culture, Urine     Status: Abnormal   Collection Time: 05/18/20  11:18 AM   Specimen: Urine, Random  Result Value Ref Range Status   Specimen Description URINE, RANDOM  Final   Special Requests   Final    NONE Performed at [facility], [address] [place],  [address]    Culture >=100,000 COLONIES/mL ESCHERICHIA COLI (A)  Final   Report Status 05/22/2020 FINAL  Final   Organism ID, Bacteria ESCHERICHIA COLI (A)  Final      Susceptibility   Escherichia coli - MIC*    AMPICILLIN >=32 RESISTANT Resistant     CEFAZOLIN >=64 RESISTANT Resistant     CEFTRIAXONE 1 SENSITIVE Sensitive     CIPROFLOXACIN <=0.25 SENSITIVE Sensitive     GENTAMICIN <=1 SENSITIVE Sensitive     IMIPENEM <=0.25  SENSITIVE Sensitive     NITROFURANTOIN <=16 SENSITIVE Sensitive     TRIMETH/SULFA <=20 SENSITIVE Sensitive     AMPICILLIN/SULBACTAM >=32 RESISTANT Resistant     PIP/TAZO 16 SENSITIVE Sensitive     * >=100,000 COLONIES/mL ESCHERICHIA COLI         Radiology Studies: No results found.      Scheduled Meds: . aspirin  325 mg Oral Daily  . carbidopa-levodopa  1 tablet Oral TID  . carvedilol  12.5 mg Oral BID WC  . chlorhexidine  15 mL Mouth Rinse BID  . divalproex  125 mg Oral Daily  . donepezil  10 mg Oral Daily  . enoxaparin (LOVENOX) injection  40 mg Subcutaneous Q24H  . feeding supplement (ENSURE ENLIVE)  237 mL Oral TID BM  . hydroxypropyl methylcellulose / hypromellose  1 drop Both Eyes TID  . mouth rinse  15 mL Mouth Rinse q12n4p  . megestrol  400 mg Oral Daily  . multivitamin with minerals  1 tablet Oral Daily  . polyethylene glycol  17 g Oral Daily  . rosuvastatin  10 mg Oral Daily  . senna-docusate  1 tablet Oral BID  . sertraline  100 mg Oral QHS  . sodium bicarbonate  1,300 mg Oral TID  . sodium chloride flush  3 mL Intravenous Once   Continuous Infusions:    LOS: 42 days    Time spent: 35 minutes.     Elmarie Shiley, MD [facility]   If 7PM-7AM, please contact night-coverage [email or web address]  05/25/2020, 3:18 PM

## 2020-05-25 NOTE — Clinical Social Work Note (Signed)
Attempted to reach [facility] (Adult Medicaid Worker) supervisor, Sharmon Leyden [phone]) and message left. CSW will continue attempts to reach patient's Medicaid worker or her supervisor, or other personnel at [facility] if needed.  Izen Petz Givens, MSW, LCSW Licensed Clinical Social Worker [place] [phone]

## 2020-05-26 ENCOUNTER — Encounter: Payer: Self-pay | Admitting: Cardiovascular Disease

## 2020-05-26 DIAGNOSIS — R5383 Other fatigue: Secondary | ICD-10-CM

## 2020-05-26 NOTE — TOC Progression Note (Addendum)
Transition of Care [facility]) - Progression Note    Patient Details  Name: [name] MRN: [id number] Date of Birth: August 18, 1956  [facility]) CM/SW Contact  Sharlet Salina Mila Homer, LCSW Phone Number: 05/26/2020, 3:04 PM  Clinical Narrative:  CSW talked with Adult Medicaid Supervisor Sharmon Leyden [phone]) regarding patient and was provided with clarity regarding getting patient placed. Per Ms. Evelene Croon, Patient has [place] Medicaid ([id number]) and once a facility is found that will accept the patient, the facility will need to complete and submit an [place]-2 for SNF to the state.  CSW will initiate search for a facility for LTC Medicaid and provide needed information to facilities.    *Patient's clinicals sent out to [facility] and surrounding counties.    Expected Discharge Plan: Skilled Nursing Facility Barriers to Discharge: Ship broker, Other (comment) (Initiating facility search)  Expected Discharge Plan and Services Expected Discharge Plan: [place] In-house Referral: Clinical Social Work     Living arrangements for the past 2 months: [facility])                                     Social Determinants of Health (SDOH) Interventions  Patient cannot be discharged to home as she cannot take care of herself and her son is unable to provide the care she needs.   Readmission Risk Interventions Readmission Risk Prevention Plan 01/02/2020 12/25/2019  Transportation Screening Complete Complete  PCP or Specialist Appt within 5-7 Days Complete Complete  Home Care Screening Complete Complete  Medication Review (RN CM) Complete Complete  Some recent data might be hidden

## 2020-05-26 NOTE — Plan of Care (Signed)
  Problem: Activity: Goal: Risk for activity intolerance will decrease Outcome: Completed/Met   Problem: Coping: Goal: Level of anxiety will decrease Outcome: Completed/Met   Problem: Elimination: Goal: Will not experience complications related to bowel motility Outcome: Completed/Met Goal: Will not experience complications related to urinary retention Outcome: Completed/Met   

## 2020-05-26 NOTE — Progress Notes (Signed)
PROGRESS NOTE  [name] ALP:[id number] DOB: 1955-11-13 DOA: 04/13/2020 PCP: Lujean Amel, MD   LOS: 33 days   Brief narrative:  64 year old with past medical history significant for dementia, Parkinson disease, hypertension, hypercalcemia, chronic diastolic heart failure presented to the hospital with worsening confusion, poor oral intake for 3 days prior to admission.  Patient had stopped taking her medication 3 days prior to admission.  On arrival to the ED, patient was severely dehydrated with sodium 154, chloride 117, calcium 11.2, corrected calcium greater than 12.5, severe hypoalbuminemia.  CT head showed chronic cortical right parietal lobe infarct, no acute findings. Patient was admitted with a diagnosis of hypercalcemia and dehydration and metabolic encephalopathy, severe malnutrition and failure to thrive.  Patient was started on IV fluids and her electrolytes improved.  Patient became more alert and back to baseline however she remains intermittently confused due to her baseline dementia.  PT evaluated patient and is recommending a skilled nursing facility.  PTH and vitamin D was noted to be normal,  SPEP normal, Ca trended up again and was given bisphosphonates and calcitonin, PTHrp was negative.  Currently awaiting placement, does not have long term care insurance.  -8/24 with low-grade fever and tachycardia  Assessment/Plan:  Severe dehydration with hypernatremia: Adult failure to thrive  Resolved.  On the background of dementia and Parkinson's disease.  -Encouraged oral hydration.  High chance of recurrence of dehydration due to Parkinson's disease and poor oral intake. -poor long term prognosis, pt continues to wish for full scope of Rx and family wants Rehab  Acute metabolic encephalopathy:   Secondary to hypernatremia, severe hypercalcemia and dehydration.  Improved. Urinalysis, blood cultures are negative. Extensive work-up for encephalopathy was negative.   -she  does have failure to thrive, dementia, parkinsons disease    Recent aspiration pneumonia   Secondary to dementia, Parkinson's disease and metabolic encephalopathy.  Completed Unasyn course 1 week ago.  Blood cultures negative.   -  Currently on dysphagia 3 diet.  -  Continue aspiration precautions.  Metabolic acidosis -?  RTA, started on oral bicarbonate -Improved, monitor  Hypercalcemia likely worsened by dehydration. PTH was normal, vitamin D level 34. SPEP without monoclonal protein. PTH rp <2.0.  Received 1 dose of Zometa on 8/6.  -Corrected calcium borderline high.  - encourage oral hydration  - Patient is mostly bedbound at this time.  Mild hypophosphatemia.   -Replaced  Anorexia/Severe protein calorie malnutrition  -continue Ensure, Megace.  Very low albumin.   Essential hypertension: Continue with Coreg, ibersartan.      Parkinsons disease  Dementia; Continue Sinemet, donepezil.   Aspiration precaution.  Thoracic aortic aneurysm Incidentally noted on CT scanning, will need outpatient follow-up  Oral thrush; nystatin, improved.   Conjunctivitis; improved.  On Cipro eyedrops.  Will discontinue.  Palliative care encounter Prognosis is poor, discussed with patient on multiple occasions, continues to wish for full code and full scope of treatment at this time  -Recommend palliative care follow-up at SNF  -Family is unable to care for her at home, awaiting Medicaid for long-term placement  DVT prophylaxis: Lovenox  code Status: Full code Family Communication: Discussed with patient, no family at bedside Status is: Inpatient  Remains inpatient appropriate because:Unsafe DC plan, awaiting long-term placement.  Dispo:  Patient From:  Home  Planned Disposition:   SNF with palliative care.  Medically stable for disposition.  Expected discharge date: unknown  Medically stable for discharge: Yes  Consultants:  None   Procedures:  None  Antibiotics:   . Unasyn 05/04/2020>8/15  Anti-infectives (From admission, onward)   Start     Dose/Rate Route Frequency Ordered Stop   05/19/20 1230  cefTRIAXone (ROCEPHIN) 1 g in sodium chloride 0.9 % 100 mL IVPB        1 g 200 mL/hr over 30 Minutes Intravenous Every 24 hours 05/19/20 1141 05/21/20 1236   05/04/20 0845  ampicillin-sulbactam (UNASYN) 1.5 g in sodium chloride 0.9 % 100 mL IVPB  Status:  Discontinued        1.5 g 200 mL/hr over 30 Minutes Intravenous Every 6 hours 05/04/20 0838 05/09/20 1140     Subjective: Feels okay, no events overnight  Objective: Vitals:   05/26/20 0452 05/26/20 0800  BP: (!) 185/104 (!) 156/88  Pulse: (!) 57 66  Resp: 17 18  Temp: 98.9 F (37.2 C) 98.1 F (36.7 C)  SpO2: 100% 98%    Intake/Output Summary (Last 24 hours) at 05/26/2020 1153 Last data filed at 05/26/2020 0900 Gross per 24 hour  Intake 1120 ml  Output 1000 ml  Net 120 ml   Filed Weights   05/18/20 2043 05/21/20 0459 05/26/20 0452  Weight: 51.3 kg 53.8 kg 51.1 kg   Body mass index is 22 kg/m.   Physical Exam:  General: Pleasant chronically ill cachectic female laying in bed, slow to respond, interactive, oriented to self and place, no distress HEENT: No JVD CVS: S1-S2, regular rate rhythm Lungs: Poor air movement bilaterally, diminished at both bases Abdomen: Soft, nontender, bowel sounds present Extremities: No edema Psych: Flat affect  CNS: Moves all extremities, no localizing signs, positive fine tremors   Data Review: I have personally reviewed the following laboratory data and studies,  CBC: Recent Labs  Lab 05/21/20 0322 05/25/20 0508  WBC 5.0 3.7*  HGB 9.8* 10.2*  HCT 31.5* 31.9*  MCV 82.7 82.0  PLT PLATELET CLUMPS NOTED ON SMEAR, UNABLE TO ESTIMATE 818   Basic Metabolic Panel: Recent Labs  Lab 05/21/20 0847 05/22/20 0403 05/23/20 0829 05/24/20 0810 05/25/20 0508  NA 137 136 136 139 137  K 4.2 3.5 4.1 4.5 3.5  CL 112* 107 106 109 106  CO2 15* 19* 18* 19*  20*  GLUCOSE 113* 109* 95 95 95  BUN 14 12 15 15 17   CREATININE 0.69 0.72 0.69 0.79 0.86  CALCIUM 10.2 10.2 10.3 10.3 9.8   Liver Function Tests: No results for input(s): AST, ALT, ALKPHOS, BILITOT, PROT, ALBUMIN in the last 168 hours. No results for input(s): LIPASE, AMYLASE in the last 168 hours. No results for input(s): AMMONIA in the last 168 hours. Cardiac Enzymes: No results for input(s): CKTOTAL, CKMB, CKMBINDEX, TROPONINI in the last 168 hours. BNP (last 3 results) No results for input(s): BNP in the last 8760 hours.  ProBNP (last 3 results) No results for input(s): PROBNP in the last 8760 hours.  CBG: No results for input(s): GLUCAP in the last 168 hours. Recent Results (from the past 240 hour(s))  Culture, Urine     Status: Abnormal   Collection Time: 05/18/20 11:18 AM   Specimen: Urine, Random  Result Value Ref Range Status   Specimen Description URINE, RANDOM  Final   Special Requests   Final    NONE Performed at [facility], [address] [place], [place] [address]    Culture >=100,000 COLONIES/mL ESCHERICHIA COLI (A)  Final   Report Status 05/22/2020 FINAL  Final   Organism ID, Bacteria ESCHERICHIA COLI (A)  Final  Susceptibility   Escherichia coli - MIC*    AMPICILLIN >=32 RESISTANT Resistant     CEFAZOLIN >=64 RESISTANT Resistant     CEFTRIAXONE 1 SENSITIVE Sensitive     CIPROFLOXACIN <=0.25 SENSITIVE Sensitive     GENTAMICIN <=1 SENSITIVE Sensitive     IMIPENEM <=0.25 SENSITIVE Sensitive     NITROFURANTOIN <=16 SENSITIVE Sensitive     TRIMETH/SULFA <=20 SENSITIVE Sensitive     AMPICILLIN/SULBACTAM >=32 RESISTANT Resistant     PIP/TAZO 16 SENSITIVE Sensitive     * >=100,000 COLONIES/mL ESCHERICHIA COLI     Studies: No results found.  Domenic Polite, MD  [facility] 05/26/2020

## 2020-05-26 NOTE — NC FL2 (Signed)
[place] LEVEL OF CARE SCREENING TOOL     IDENTIFICATION  Patient Name: [name] Birthdate: March 25, 1956 Sex: female Admission Date (Current Location): 04/13/2020  [place] and [place] Number:  Kathleen Argue [id number] ([place] Medicaid) Facility and Address:  [facility]. [facility], [place] [address] [place], [place] [address]      Provider Number: [id number]  Attending Physician Name and Address:  Domenic Polite, MD  Relative Name and Phone Number:  [name] - son; [phone]    Current Level of Care: Hospital Recommended Level of Care: [place] (Patient needs LTC placement in a skilled nursing facility) Prior Approval Number:    Date Approved/Denied:   PASRR Number: [id number]  Discharge Plan: SNF (Long-term care placement)    Current Diagnoses: Patient Active Problem List   Diagnosis Date Noted  . Malnutrition of moderate degree 04/15/2020  . Hypercalcemia 04/14/2020  . Dehydration, severe 04/13/2020  . Dehydration 04/13/2020  . Acute hypernatremia   . Failure to thrive in adult   . Closed left hip fracture, initial encounter ([place]) 12/30/2019  . Frequent falls   . Orthostatic hypotension 12/23/2019  . Syncopal episodes 12/22/2019  . Dementia due to Parkinson's disease without behavioral disturbance ([place]) 12/22/2019  . Renal insufficiency 12/22/2019  . Near syncope 09/04/2019  . AKI (acute kidney injury) ([place]) 09/04/2019  . Hypokalemia 09/04/2019  . Depression 09/04/2019  . Dementia ([place]) 09/04/2019  . Thrombocytopenia ([place]) 09/04/2019  . Low back pain 06/25/2019  . S/P AVR (aortic valve replacement) 12/12/2016  . Aortic stenosis 11/16/2016  . Severe aortic stenosis   . Polycythemia 06/06/2016  . Menopausal state 02/16/2016  . Hyperparathyroidism, primary ([place]) 02/16/2016  . Chronic diastolic CHF (congestive heart failure) ([place]) 05/07/2014  . Hx of colonic polyps 06/17/2013  . Ventricular tachycardia ([place])  02/21/2011  . Automatic implantable cardioverter-defibrillator in situ 06/02/2009  . Hyperlipidemia 05/29/2009  . SMOKER 05/29/2009  . Essential hypertension 05/29/2009  . Aortic valve disorder 05/29/2009  . CARDIOMYOPATHY, SECONDARY 05/29/2009  . CHF 05/29/2009  . BICUSPID AORTIC VALVE 05/29/2009  . LIVER FUNCTION TESTS, ABNORMAL, HX OF 05/29/2009  . CHOLECYSTITIS, HX OF 05/29/2009    Orientation RESPIRATION BLADDER Height & Weight     Self  Normal Incontinent, External catheter Weight: 112 lb 10.5 oz (51.1 kg) Height:  5' (152.4 cm)  BEHAVIORAL SYMPTOMS/MOOD NEUROLOGICAL BOWEL NUTRITION STATUS      Incontinent Diet (DYS 3 (Mechanical Soft, Thin liquid))  AMBULATORY STATUS COMMUNICATION OF NEEDS Skin   Total Care Verbally Other (Comment) (MASD Upper buttocks)                       Personal Care Assistance Level of Assistance  Bathing, Feeding, Dressing Bathing Assistance: Maximum assistance Feeding assistance: Limited assistance Dressing Assistance: Maximum assistance     Functional Limitations Info  Sight, Hearing, Speech Sight Info: Impaired Hearing Info: Adequate Speech Info: Adequate    SPECIAL CARE FACTORS FREQUENCY  PT (By licensed PT), OT (By licensed OT)     PT Frequency: PT eval 7/21. Patient needs LTC placement OT Frequency: OT eval 7/23 - Patient needs LTC placement            Contractures Contractures Info: Not present    Additional Factors Info  Code Status, Allergies Code Status Info: Full Allergies Info: Trospium Chloride Er           Current Medications (05/26/2020):  This is the current hospital active medication list Current Facility-Administered  Medications  Medication Dose Route Frequency Provider Last Rate Last Admin  . acetaminophen (TYLENOL) tablet 650 mg  650 mg Oral Q6H PRN Shela Leff, MD   650 mg at 05/20/20 2132  . alum & mag hydroxide-simeth (MAALOX/MYLANTA) 200-200-20 MG/5ML suspension 30 mL  30 mL Oral Q6H PRN  Wynetta Fines T, MD      . aspirin tablet 325 mg  325 mg Oral Daily Wynetta Fines T, MD   325 mg at 05/26/20 1022  . carbidopa-levodopa (SINEMET IR) 25-100 MG per tablet immediate release 1 tablet  1 tablet Oral TID Hayden Rasmussen, MD   1 tablet at 05/26/20 1624  . carvedilol (COREG) tablet 12.5 mg  12.5 mg Oral BID WC Wynetta Fines T, MD   12.5 mg at 05/26/20 1624  . chlorhexidine (PERIDEX) 0.12 % solution 15 mL  15 mL Mouth Rinse BID Darliss Cheney, MD   15 mL at 05/26/20 1024  . divalproex (DEPAKOTE SPRINKLE) capsule 125 mg  125 mg Oral Daily Wynetta Fines T, MD   125 mg at 05/26/20 1024  . donepezil (ARICEPT) tablet 10 mg  10 mg Oral Daily Wynetta Fines T, MD   10 mg at 05/26/20 1019  . enoxaparin (LOVENOX) injection 40 mg  40 mg Subcutaneous Q24H Regalado, Belkys A, MD   40 mg at 05/26/20 1024  . feeding supplement (ENSURE ENLIVE) (ENSURE ENLIVE) liquid 237 mL  237 mL Oral TID BM Regalado, Belkys A, MD   237 mL at 05/26/20 1405  . guaiFENesin (ROBITUSSIN) 100 MG/5ML solution 200 mg  200 mg Oral Q6H PRN Wynetta Fines T, MD      . hydrALAZINE (APRESOLINE) injection 10 mg  10 mg Intravenous Q4H PRN Darliss Cheney, MD   10 mg at 05/18/20 0903  . hydroxypropyl methylcellulose / hypromellose (ISOPTO TEARS / GONIOVISC) 2.5 % ophthalmic solution 1 drop  1 drop Both Eyes TID Regalado, Belkys A, MD   1 drop at 05/26/20 1628  . MEDLINE mouth rinse  15 mL Mouth Rinse q12n4p Darliss Cheney, MD   15 mL at 05/26/20 1628  . megestrol (MEGACE) 400 MG/10ML suspension 400 mg  400 mg Oral Daily Regalado, Belkys A, MD   400 mg at 05/26/20 1024  . multivitamin with minerals tablet 1 tablet  1 tablet Oral Daily Lequita Halt, MD   1 tablet at 05/26/20 1022  . neomycin-bacitracin-polymyxin (NEOSPORIN) ointment 1 application  1 application Topical PRN Wynetta Fines T, MD      . polyethylene glycol (MIRALAX / GLYCOLAX) packet 17 g  17 g Oral Daily Wynetta Fines T, MD   17 g at 05/26/20 1017  . polyvinyl alcohol (LIQUIFILM TEARS) 1.4 %  ophthalmic solution   Both Eyes Daily PRN Wynetta Fines T, MD      . rosuvastatin (CRESTOR) tablet 10 mg  10 mg Oral Daily Wynetta Fines T, MD   10 mg at 05/26/20 1019  . senna-docusate (Senokot-S) tablet 1 tablet  1 tablet Oral BID Regalado, Belkys A, MD   1 tablet at 05/26/20 1019  . sertraline (ZOLOFT) tablet 100 mg  100 mg Oral QHS Wynetta Fines T, MD   100 mg at 05/25/20 2229  . sodium bicarbonate tablet 1,300 mg  1,300 mg Oral TID Regalado, Belkys A, MD   1,300 mg at 05/26/20 1624  . sodium chloride flush (NS) 0.9 % injection 3 mL  3 mL Intravenous Once Hayden Rasmussen, MD         Discharge  Medications: Please see discharge summary for a list of discharge medications.  Relevant Imaging Results:  Relevant Lab Results:   Additional Information ss#[id number]. Patient has had the [place] vaccinations in March 2021  [name], LCSW

## 2020-05-27 MED ORDER — AMLODIPINE BESYLATE 10 MG PO TABS
10.0000 mg | ORAL_TABLET | Freq: Every day | ORAL | Status: DC
  Administered 2020-05-27 – 2020-05-28 (×2): 10 mg via ORAL
  Filled 2020-05-27 (×2): qty 1

## 2020-05-27 NOTE — Progress Notes (Signed)
Pt seen, no changes, awaiting long-term placement  Tiffany Polite, MD

## 2020-05-27 NOTE — TOC Progression Note (Signed)
Transition of Care [facility]) - Progression Note    Patient Details  Name: [name] MRN: [id number] Date of Birth: December 08, 1955  [facility]) CM/SW Contact   Sharlet Salina Mila Homer, LCSW Phone Number: 05/27/2020, 4:10 PM  Clinical Narrative: CSW followed up with SNF's that responded in Epic that that could accept patient. [facility]: Message left [place]: No LTC beds [facility]: Message left [place]: No LTC beds available at this time [facility] - Left message for admissions director  [place]itta [place]: Talked with Narda Rutherford, admissions director regarding patient. She requested to know if patient had been vaccinated. Contacted son [name] and he reported that she was vaccinated at the ALF. Call made to [facility] and talked with Jimmie Molly. She reported that patient was vaccinated and will fax information to [facility]. Fax number provided to Ms. Woolard. Janie at [facility] contacted and updated. She will check on bed availability and follow-up with CSW.    Expected Discharge Plan: Skilled Nursing Facility Barriers to Discharge: Ship broker, Other (comment) (Initiating facility search)  Expected Discharge Plan and Services Expected Discharge Plan: [place] In-house Referral: Clinical Social Work     Living arrangements for the past 2 months: [place] ([facility])                                     Social Determinants of Health (SDOH) Interventions  No SDOH interventions requested or needed at this time  Readmission Risk Interventions Readmission Risk Prevention Plan 01/02/2020 12/25/2019  Transportation Screening Complete Complete  PCP or Specialist Appt within 5-7 Days Complete Complete  Home Care Screening Complete Complete  Medication Review (RN CM) Complete Complete  Some recent data might be hidden

## 2020-05-28 LAB — SARS CORONAVIRUS 2 BY RT PCR (HOSPITAL ORDER, PERFORMED IN ~~LOC~~ HOSPITAL LAB): SARS Coronavirus 2: NEGATIVE

## 2020-05-28 MED ORDER — AMLODIPINE BESYLATE 10 MG PO TABS: 10.0000 mg | ORAL_TABLET | Freq: Every day | ORAL | Status: DC

## 2020-05-28 MED ORDER — ENSURE ENLIVE PO LIQD: 237.0000 mL | Freq: Three times a day (TID) | ORAL | 12 refills | Status: DC

## 2020-05-28 MED ORDER — ACETAMINOPHEN 325 MG PO TABS: 650.0000 mg | ORAL_TABLET | Freq: Four times a day (QID) | ORAL | 0 refills | Status: DC | PRN

## 2020-05-28 MED ORDER — SODIUM BICARBONATE 650 MG PO TABS
650.0000 mg | ORAL_TABLET | Freq: Two times a day (BID) | ORAL | Status: DC
Start: 2020-05-28 — End: 2020-11-26

## 2020-05-28 NOTE — Care Management Important Message (Signed)
Important Message  Patient Details  Name: [name] MRN: [id number] Date of Birth: July 14, 1956   Medicare Important Message Given:  Yes - Important Message mailed due to current National Emergency  Verbal consent obtained due to current National Emergency  Relationship to patient: Self Contact Name: Cheyan Frees Call Date: 05/28/20  Time: 1540 Phone: [phone] Outcome: No Answer/Busy Important Message mailed to: Patient address on file    Delorse Lek 05/28/2020, 3:41 PM

## 2020-05-28 NOTE — Progress Notes (Signed)
DISCHARGE NOTE SNF [name] to be discharged Skilled nursing facility per MD order. Patient verbalized understanding.  Skin clean, dry and intact without evidence of skin break down, no evidence of skin tears noted. IV catheter discontinued intact. Site without signs and symptoms of complications. Dressing and pressure applied. Pt denies pain at the site currently. No complaints noted.  Patient free of lines, drains, and wounds.   Discharge packet assembled. An After Visit Summary (AVS) was printed and given to the EMS personnel. Patient escorted via stretcher and discharged to [facility] via ambulance. Report called to accepting facility; all questions and concerns addressed.   RN contacted [facility] center at [phone] to attempt to give report to the receiving nurse whom did not pick up. RN left a voicemail for the receiving RN to give a call back to receive report on this patient.  Beatris Ship, RN

## 2020-05-28 NOTE — Discharge Summary (Signed)
Physician Discharge Summary  [name] XFG:[id number] DOB: May 16, 1956 DOA: 04/13/2020  PCP: Lujean Amel, MD  Admit date: 04/13/2020 Discharge date: 05/28/2020  Time spent: 35 minutes  Recommendations for Outpatient Follow-up:  1. PCP in 1 week 2. Please continue goals of care discussions, overall prognosis remains poor 3. Palliative care follow-up at SNF   Discharge Diagnoses:  Severe dehydration Hypernatremia Hypercalcemia Dementia Parkinson's disease Adult failure to thrive Acute metabolic encephalopathy Severe protein calorie malnutrition Metabolic acidosis Aspiration pneumonia   Essential hypertension   Dementia due to Parkinson's disease without behavioral disturbance (HCC)   Dehydration, severe   Dehydration   Acute hypernatremia   Hypercalcemia    Discharge Condition: Stable  Diet recommendation: Dysphagia 3 diet, thin liquids  Filed Weights   05/21/20 0459 05/26/20 0452 05/26/20 2111  Weight: 53.8 kg 51.1 kg 50.2 kg    History of present illness:  64 year old with past medical history significant for dementia, Parkinson disease, hypertension, hypercalcemia, chronic diastolic heart failure presented to the hospital with worsening confusion, poor oral intake for 3 days prior to admission. Patient had stopped taking her medication 3 days prior to admission. On arrival to the ED, patient was severely dehydrated with sodium 154, chloride 117, calcium 11.2, corrected calcium greater than 12.5, severe hypoalbuminemia. CT head showed chronic cortical right parietal lobe infarct, no acute findings. Patient was admitted with a diagnosis of hypercalcemia and dehydration and metabolic encephalopathy, severe malnutrition and failure to thrive.  Hospital Course:   Severe dehydration with hypernatremia: Adult failure to thrive  -Admitted with dehydration, hypernatremia in the background of dementia and Parkinson's disease.  -Improved with hydration -Encouraged  increased liquid intake, high chance of recurrence of dehydration due to Parkinson's disease and poor oral intake. -poor long term prognosis -Continue palliative care conversations and palliative care follow-up at SNF  Acute metabolic encephalopathy:   Secondary to hypernatremia, severe hypercalcemia and dehydration.  Improved. -she does have failure to thrive, dementia, parkinsons disease    Recent aspiration pneumonia   Secondary to dementia, Parkinson's disease and metabolic encephalopathy.  Completed Unasyn course 1 week ago.  Blood cultures negative.   -  Currently on dysphagia 3 diet.  -  Continue aspiration precautions.  Metabolic acidosis -?  RTA, started on oral bicarbonate -Improved, monitor  Hypercalcemia likely worsened by dehydration. PTH was normal, vitamin D level 34. SPEP without monoclonal protein. PTH rp <2.0.  Received 1 dose of Zometa on 8/6.  -Now improved and stable, encouraged oral hydration  Mild hypophosphatemia.   -Replaced  Anorexia/Severe protein calorie malnutrition  -continue Ensure -Albumin remains low  Essential hypertension:Continue with Coreg    Parkinsons disease  Dementia; Continue Sinemet, donepezil.   Aspiration precautions  Thoracic aortic aneurysm Incidentally noted on CT scanning, will need outpatient follow-up  Oral thrush; nystatin, improved.   Conjunctivitis; improved.  Palliative care encounter Prognosis is poor, he is bedbound, total care , discussed with patient on multiple occasions, continues to wish for full code and full scope of treatment at this time  -Recommend palliative care follow-up at SNF   Discharge Exam: Vitals:   05/28/20 0541 05/28/20 0950  BP: 137/88 123/74  Pulse: 61 70  Resp:  18  Temp: 98.3 F (36.8 C) 98.6 F (37 C)  SpO2: 100% 100%    General: Awake alert, answers questions oriented to self and place Cardiovascular:S1S2/RRR Respiratory: CTAB  Discharge  Instructions   Discharge Instructions    Diet - low sodium heart healthy   Complete by:  As directed    Discharge wound care:   Complete by: As directed    As above   Increase activity slowly   Complete by: As directed      Allergies as of 05/28/2020      Reactions   Trospium Chloride Er Other (See Comments)   Constipation       Medication List    STOP taking these medications   alum & mag hydroxide-simeth 200-200-20 MG/5ML suspension Commonly known as: MAALOX/MYLANTA   furosemide 20 MG tablet Commonly known as: LASIX   irbesartan 75 MG tablet Commonly known as: AVAPRO   loperamide 2 MG capsule Commonly known as: IMODIUM   LORazepam 0.5 MG tablet Commonly known as: ATIVAN   magnesium hydroxide 400 MG/5ML suspension Commonly known as: MILK OF MAGNESIA   NON FORMULARY   olmesartan 20 MG tablet Commonly known as: BENICAR   oxyCODONE 5 MG immediate release tablet Commonly known as: Oxy IR/ROXICODONE     TAKE these medications   acetaminophen 325 MG tablet Commonly known as: TYLENOL Take 2 tablets (650 mg total) by mouth every 6 (six) hours as needed for mild pain, fever or headache. What changed:   when to take this  reasons to take this   amLODipine 10 MG tablet Commonly known as: NORVASC Take 1 tablet (10 mg total) by mouth daily. Start taking on: May 29, 2020   aspirin 325 MG tablet Take 325 mg by mouth daily.   carbidopa-levodopa 25-100 MG tablet Commonly known as: SINEMET IR Take 1 tablet by mouth 3 (three) times daily.   carvedilol 12.5 MG tablet Commonly known as: COREG Take 12.5 mg by mouth 2 (two) times a day. With food   divalproex 125 MG capsule Commonly known as: DEPAKOTE SPRINKLE Take 125 mg by mouth daily.   donepezil 10 MG tablet Commonly known as: ARICEPT Take 1 tablet (10 mg total) by mouth daily.   feeding supplement (ENSURE ENLIVE) Liqd Take 237 mLs by mouth 3 (three) times daily between meals.   guaifenesin 100  MG/5ML syrup Commonly known as: ROBITUSSIN Take 200 mg by mouth every 6 (six) hours as needed for cough.   multivitamin with minerals Tabs tablet Take 1 tablet by mouth daily.   polyethylene glycol 17 g packet Commonly known as: MIRALAX / GLYCOLAX Take 17 g by mouth daily.   rosuvastatin 10 MG tablet Commonly known as: CRESTOR Take 1 tablet (10 mg total) by mouth daily.   sertraline 100 MG tablet Commonly known as: ZOLOFT Take 1 tablet (100 mg total) by mouth daily. What changed: when to take this   sodium bicarbonate 650 MG tablet Take 1 tablet (650 mg total) by mouth 2 (two) times daily.   SYSTANE OP Place 1 drop into both eyes daily as needed (for dry eyes.).   Triple Antibiotic [phone] Oint Apply 1 application topically as needed (for minor skin tears or abrasions, after cleaning with normal saline, then cover with Band-Aid or gauze and tape).            Discharge Care Instructions  (From admission, onward)         Start     Ordered   05/28/20 0000  Discharge wound care:       Comments: As above   05/28/20 1351         Allergies  Allergen Reactions  . Trospium Chloride Er Other (See Comments)    Constipation       The results of significant  diagnostics from this hospitalization (including imaging, microbiology, ancillary and laboratory) are listed below for reference.    Significant Diagnostic Studies: DG Chest 1 View  Result Date: 05/16/2020 CLINICAL DATA:  64 year old female with fatigue, failure thrive EXAM: CHEST  1 VIEW COMPARISON:  Prior chest x-ray 05/04/2020 FINDINGS: Left subclavian approach biventricular cardiac rhythm maintenance device remains in stable position. Leads project over the right atrium, right ventricle and overlying the left ventricle. Patient is status post median sternotomy with evidence of prior aortic valve replacement. Stable atherosclerotic calcifications in the transverse aorta. The heart remains normal in size. No  pulmonary edema, focal airspace consolidation, pleural effusion or pneumothorax. IMPRESSION: Stable chest x-ray without evidence of acute cardiopulmonary disease. Electronically Signed   By: Jacqulynn Cadet M.D.   On: 05/16/2020 10:28   CT HEAD WO CONTRAST  Result Date: 05/02/2020 CLINICAL DATA:  Acute neurologic deficit EXAM: CT HEAD WITHOUT CONTRAST TECHNIQUE: Contiguous axial images were obtained from the base of the skull through the vertex without intravenous contrast. COMPARISON:  04/13/2020 FINDINGS: Brain: There is no mass, hemorrhage or extra-axial collection. There is generalized atrophy without lobar predilection. Hypodensity of the white matter is most commonly associated with chronic microvascular disease. Vascular: Atherosclerotic calcification of the vertebral and internal carotid arteries at the skull base. No abnormal hyperdensity of the major intracranial arteries or dural venous sinuses. Skull: The visualized skull base, calvarium and extracranial soft tissues are normal. Sinuses/Orbits: No fluid levels or advanced mucosal thickening of the visualized paranasal sinuses. No mastoid or middle ear effusion. The orbits are normal. IMPRESSION: Generalized atrophy and chronic microvascular ischemia without acute intracranial abnormality. Electronically Signed   By: Ulyses Jarred M.D.   On: 05/02/2020 03:13   CT ANGIO CHEST PE W OR WO CONTRAST  Result Date: 05/02/2020 CLINICAL DATA:  Elevated D-dimer, episodes of unresponsiveness EXAM: CT ANGIOGRAPHY CHEST WITH CONTRAST TECHNIQUE: Multidetector CT imaging of the chest was performed using the standard protocol during bolus administration of intravenous contrast. Multiplanar CT image reconstructions and MIPs were obtained to evaluate the vascular anatomy. CONTRAST:  45mL OMNIPAQUE IOHEXOL 350 MG/ML SOLN COMPARISON:  Chest radiograph dated 05/02/2020. FINDINGS: Cardiovascular: Satisfactory opacification of the bilateral pulmonary arteries to the  subsegmental level. No evidence of pulmonary embolism. Prosthetic aortic valve. 4.2 cm ascending thoracic aortic aneurysm (coronal image 30). Atherosclerotic calcifications of the aortic arch. The heart is top-normal in size. No pericardial effusion. Left subclavian ICD. Three vessel coronary atherosclerosis. Mediastinum/Nodes: No suspicious mediastinal lymphadenopathy. Visualized thyroid is unremarkable. Lungs/Pleura: No suspicious pulmonary nodules. No focal consolidation. Minimal dependent atelectasis in the bilateral lower lobes, left greater than right. Mild centrilobular emphysematous changes, upper lung predominant. No pleural effusion or pneumothorax. Upper Abdomen: Visualized upper abdomen is grossly unremarkable, noting vascular calcifications. Musculoskeletal: Exaggerated thoracic kyphosis with mild degenerative changes of the mid thoracic spine. Median sternotomy. Review of the MIP images confirms the above findings. IMPRESSION: No evidence of pulmonary embolism. Prosthetic aortic valve with 4.2 cm ascending thoracic aortic aneurysm. Recommend annual imaging followup by CTA or MRA. This recommendation follows 2010 ACCF/AHA/AATS/ACR/ASA/SCA/SCAI/SIR/STS/SVM Guidelines for the Diagnosis and Management of Patients with Thoracic Aortic Disease. Circulation. 2010; 121: Z660-Y301. Aortic aneurysm NOS (ICD10-I71.9) Aortic Atherosclerosis (ICD10-I70.0) and Emphysema (ICD10-J43.9). Electronically Signed   By: Julian Hy M.D.   On: 05/02/2020 07:57   DG CHEST PORT 1 VIEW  Result Date: 05/18/2020 CLINICAL DATA:  Fever. EXAM: PORTABLE CHEST 1 VIEW COMPARISON:  05/16/2020 FINDINGS: 0915 hours. The lungs are clear without focal pneumonia, edema,  pneumothorax or pleural effusion. The cardiopericardial silhouette is within normal limits for size. Left-sided pacer/AICD again noted. The visualized bony structures of the thorax show now acute abnormality. IMPRESSION: No active disease. Electronically Signed    By: Misty Stanley M.D.   On: 05/18/2020 09:26   DG CHEST PORT 1 VIEW  Result Date: 05/04/2020 CLINICAL DATA:  Fever EXAM: PORTABLE CHEST 1 VIEW COMPARISON:  May 02, 2020 chest CT and CT angiogram chest FINDINGS: There is atelectatic change in the left base. The lungs elsewhere are clear. Heart is upper normal in size with pulmonary vascularity normal. Pacemaker leads are attached to the right atrium, right ventricle, and coronary sinus. Patient is status post aortic valve replacement. No adenopathy. There is aortic atherosclerosis. No bone lesions. IMPRESSION: Atelectatic change left base. Lungs elsewhere clear. Stable cardiac silhouette. Postoperative changes noted. Aortic Atherosclerosis (ICD10-I70.0). Electronically Signed   By: Lowella Grip III M.D.   On: 05/04/2020 08:20   DG CHEST PORT 1 VIEW  Result Date: 05/02/2020 CLINICAL DATA:  Lethargy EXAM: PORTABLE CHEST 1 VIEW COMPARISON:  None. FINDINGS: The heart size and mediastinal contours are within normal limits. Both lungs are clear. The visualized skeletal structures are unremarkable. Unchanged position of left chest wall AICD leads. Remote median sternotomy and aortic valve replacement. IMPRESSION: No active disease. Electronically Signed   By: Ulyses Jarred M.D.   On: 05/02/2020 02:16    Microbiology: No results found for this or any previous visit (from the past 240 hour(s)).   Labs: Basic Metabolic Panel: Recent Labs  Lab 05/22/20 0403 05/23/20 0829 05/24/20 0810 05/25/20 0508  NA 136 136 139 137  K 3.5 4.1 4.5 3.5  CL 107 106 109 106  CO2 19* 18* 19* 20*  GLUCOSE 109* 95 95 95  BUN 12 15 15 17   CREATININE 0.72 0.69 0.79 0.86  CALCIUM 10.2 10.3 10.3 9.8   Liver Function Tests: No results for input(s): AST, ALT, ALKPHOS, BILITOT, PROT, ALBUMIN in the last 168 hours. No results for input(s): LIPASE, AMYLASE in the last 168 hours. No results for input(s): AMMONIA in the last 168 hours. CBC: Recent Labs  Lab  05/25/20 0508  WBC 3.7*  HGB 10.2*  HCT 31.9*  MCV 82.0  PLT 296   Cardiac Enzymes: No results for input(s): CKTOTAL, CKMB, CKMBINDEX, TROPONINI in the last 168 hours. BNP: BNP (last 3 results) No results for input(s): BNP in the last 8760 hours.  ProBNP (last 3 results) No results for input(s): PROBNP in the last 8760 hours.  CBG: No results for input(s): GLUCAP in the last 168 hours.     Signed:  Domenic Polite MD.  [facility] 05/28/2020, 1:51 PM

## 2020-05-28 NOTE — TOC Transition Note (Addendum)
Transition of Care [facility]) - CM/SW Discharge Note *Discharged to [facility] *Room Number 711 A *Number for Report: [phone]   Patient Details  Name: [name] MRN: [id number] Date of Birth: [birth year]  Transition of Care [facility]) CM/SW Contact:  [name], LCSW Phone Number: 05/28/2020, 4:18 PM   Clinical Narrative:   Patient has been medically stable for discharge and a facility was located today that can accept patient. They will [place] the [place] that [place] assist in getting her approved for LTC Medicaid.  Son informed this morning and completed admissions paperwork at [facility]. Discharge clinicals transmitted to facility. Ms. Tusing will be transported by non-emergency ambulance transport. Son contacted and informed that transport called.   Final next level of care: [facility]) Barriers to Discharge: Barriers Resolved   Patient Goals and CMS Choice Patient states their goals for this hospitalization and ongoing recovery are:: Son agreeable to SNF placement CMS Medicare.gov Compare Post Acute Care list provided to:: Patient Represenative (must comment) (Son advised regarding Medicare.gov and how to access online) Choice offered to / list presented to : Adult Children  Discharge Placement   Existing PASRR number confirmed : 04/15/20          Patient chooses bed at: [facility] (Patient was difficult to place. Facility confirmed 9/3 that accepted her) Patient to be transferred to facility by: Non-emergency ambulance transport Name of family member notified: Son [name] - [phone] Patient and family notified of of transfer: 05/28/20  Discharge Plan and Services In-house Referral: Clinical Social Work                                  Social Determinants of Health (SDOH) Interventions  No SDOH interventions requested or needed at this  time   Readmission Risk Interventions Readmission Risk Prevention Plan 01/02/2020 12/25/2019  Transportation Screening Complete Complete  PCP or Specialist Appt within 5-7 Days Complete Complete  Home Care Screening Complete Complete  Medication Review (RN CM) Complete Complete  Some recent data might be hidden

## 2020-05-28 NOTE — Progress Notes (Signed)
Pharmacy was notified of missing dosage of prescribed opthalmic solution/eye drops for patient due at 1000 and 1600 from RN. RN sent two separate alerts for each dosage. Pharmacy has not sent the medication.

## 2020-05-28 NOTE — Progress Notes (Signed)
Physical Therapy Treatment Patient Details Name: [name] MRN: [id number] DOB: 21-Nov-1955 Today's Date: 05/28/2020    History of Present Illness [name] is a 64 y.o. female with medical history significant of advanced dementia, Parkinson disease, hypertension, hypercalcemia, chronic diastolic CHF, presented with worsening confusion and not eating or drinking x3 days.  Admitted with metabolic encephalopathy and hypernatremia.  Per TOC notes, pt unable to return to ALF due to level of assist, awaiting Medicaid for SNF placement.    PT Comments    Pt awake, but not focused in the moment.  Emphasis on warm up prior to mobility, transition to sit EOB, sitting balance trying to elicit truncal muscle activity while moving bil UE's in alternating flexion/ext and completing assist leg kicks.  Sit to stand for peri-care and transfer over to the chair of work on sitting tolerance.    Follow Up Recommendations  SNF     Equipment Recommendations  Hospital bed;Wheelchair (measurements PT)    Recommendations for Other Services       Precautions / Restrictions Precautions Precautions: Fall    Mobility  Bed Mobility Overal bed mobility: Needs Assistance Bed Mobility: Supine to Sit     Supine to sit: Max assist;+2 for safety/equipment     General bed mobility comments: assist up and forward with pads to pivot.  Pt very stiff throughout.  Transfers Overall transfer level: Needs assistance   Transfers: Sit to/from Stand;Stand Pivot Transfers Sit to Stand: +2 safety/equipment;Mod assist Stand pivot transfers: +2 safety/equipment;Mod assist       General transfer comment: pt did not assist significantly in initiation or helping boost, but once upright, she helped maintain w/bearing and the rest was stability assist  Ambulation/Gait             General Gait Details: unable   Stairs             Wheelchair Mobility    Modified Rankin (Stroke Patients Only)        Balance Overall balance assessment: Needs assistance Sitting-balance support: Feet supported;Bilateral upper extremity supported;No upper extremity supported Sitting balance-Leahy Scale: Poor Sitting balance - Comments: pt sat EOB for 15 min working on activating trunk.  Min guard for secs at a time before starting to list posteriorly.  Worked to activate kicking and at [facility] into UE extension with alternating fist punches forward.  x20 reps   Standing balance support: Bilateral upper extremity supported Standing balance-Leahy Scale: Poor Standing balance comment: stood x2 for pericare, w/bearing bil LE's, but assist for stability and improving upright posture.                            Cognition Arousal/Alertness: Awake/alert Behavior During Therapy: Flat affect Overall Cognitive Status: Impaired/Different from baseline                   Orientation Level: Situation;Place;Time Current Attention Level: Sustained Memory: Decreased short-term memory Following Commands: Follows one step commands inconsistently Safety/Judgement: Decreased awareness of safety;Decreased awareness of deficits Awareness: Intellectual Problem Solving: Slow processing;Decreased initiation;Difficulty sequencing;Requires verbal cues;Requires tactile cues        Exercises Other Exercises Other Exercises: warm up bil LE hip/knee flex/extension with AA    General Comments General comments (skin integrity, edema, etc.): vss      Pertinent Vitals/Pain Pain Assessment: Faces Faces Pain Scale: Hurts little more Pain Location: knee flexion ROM Pain Descriptors / Indicators: Grimacing Pain Intervention(s): Monitored during  session    Home Living                      Prior Function            PT Goals (current goals can now be found in the care plan section) Acute Rehab PT Goals Patient Stated Goal: did not state PT Goal Formulation: Patient unable to participate in  goal setting Time For Goal Achievement: 06/10/20 Potential to Achieve Goals: Fair Progress towards PT goals: Progressing toward goals    Frequency    Min 2X/week      PT Plan Current plan remains appropriate    Co-evaluation              AM-PAC PT "6 Clicks" Mobility   Outcome Measure  Help needed turning from your back to your side while in a flat bed without using bedrails?: Total Help needed moving from lying on your back to sitting on the side of a flat bed without using bedrails?: A Lot Help needed moving to and from a bed to a chair (including a wheelchair)?: A Lot Help needed standing up from a chair using your arms (e.g., wheelchair or bedside chair)?: A Lot Help needed to walk in hospital room?: Total Help needed climbing 3-5 steps with a railing? : Total 6 Click Score: 9    End of Session   Activity Tolerance: Patient tolerated treatment well Patient left: in chair;with call bell/phone within reach;with chair alarm set Nurse Communication: Mobility status PT Visit Diagnosis: Unsteadiness on feet (R26.81);Other abnormalities of gait and mobility (R26.89);Muscle weakness (generalized) (M62.81);History of falling (Z91.81)     Time: 3893-7342 PT Time Calculation (min) (ACUTE ONLY): 26 min  Charges:  $Therapeutic Activity: 23-37 mins                     05/28/2020  Ginger Carne., PT Acute Rehabilitation Services [phone]  (pager) [phone]  (office)   Tessie Fass Kirrah Mustin 05/28/2020, 2:48 PM

## 2020-05-28 NOTE — Progress Notes (Deleted)
Patient's oxygen saturation has dropped to 81-83 and was sustaining in addition to 5L of O2. MD has been notified, Respiratory therapy has also been notified. RN will continue to monitor patient.

## 2020-05-28 NOTE — Progress Notes (Signed)
PROGRESS NOTE  [name] WJX:[id number] DOB: 12-19-1955 DOA: 04/13/2020 PCP: Lujean Amel, MD   LOS: 27 days   Brief narrative:  64 year old with past medical history significant for dementia, Parkinson disease, hypertension, hypercalcemia, chronic diastolic heart failure presented to the hospital with worsening confusion, poor oral intake for 3 days prior to admission.  Patient had stopped taking her medication 3 days prior to admission.  On arrival to the ED, patient was severely dehydrated with sodium 154, chloride 117, calcium 11.2, corrected calcium greater than 12.5, severe hypoalbuminemia.  CT head showed chronic cortical right parietal lobe infarct, no acute findings. Patient was admitted with a diagnosis of hypercalcemia and dehydration and metabolic encephalopathy, severe malnutrition and failure to thrive.  Patient was started on IV fluids and her electrolytes improved.  Patient became more alert and back to baseline however she remains intermittently confused due to her baseline dementia.  PT evaluated patient and is recommending a skilled nursing facility.  PTH and vitamin D was noted to be normal,  SPEP normal, Ca trended up again and was given bisphosphonates and calcitonin, PTHrp was negative.  Currently awaiting placement, does not have long term care insurance.   Assessment/Plan:  Severe dehydration with hypernatremia: Adult failure to thrive  Resolved,  in the background of dementia and Parkinson's disease.  -Encouraged oral hydration.  High chance of recurrence of dehydration due to Parkinson's disease and poor oral intake. -poor long term prognosis, pt continues to wish for full scope of Rx and family wants Rehab  Acute metabolic encephalopathy:   Secondary to hypernatremia, severe hypercalcemia and dehydration.  Improved. Urinalysis, blood cultures are negative. Extensive work-up for encephalopathy was negative.   -she does have failure to thrive, dementia,  parkinsons disease    Recent aspiration pneumonia   Secondary to dementia, Parkinson's disease and metabolic encephalopathy.  Completed Unasyn course 1 week ago.  Blood cultures negative.   -  Currently on dysphagia 3 diet.  -  Continue aspiration precautions.  Metabolic acidosis -?  RTA, started on oral bicarbonate -Improved, monitor  Hypercalcemia likely worsened by dehydration. PTH was normal, vitamin D level 34. SPEP without monoclonal protein. PTH rp <2.0.  Received 1 dose of Zometa on 8/6.  -Now improved and stable, encouraged oral hydration  Mild hypophosphatemia.   -Replaced  Anorexia/Severe protein calorie malnutrition  -continue Ensure, Megace. -Albumin remains low  Essential hypertension: Continue with Coreg, ibersartan.      Parkinsons disease  Dementia; Continue Sinemet, donepezil.   Aspiration precautions  Thoracic aortic aneurysm Incidentally noted on CT scanning, will need outpatient follow-up  Oral thrush; nystatin, improved.   Conjunctivitis; improved.  Palliative care encounter Prognosis is poor, discussed with patient on multiple occasions, continues to wish for full code and full scope of treatment at this time  -Recommend palliative care follow-up at SNF  -Family is unable to care for her at home, awaiting Medicaid for long-term placement  DVT prophylaxis: Lovenox  code Status: Full code Family Communication: Discussed with patient, no family at bedside Status is: Inpatient  Remains inpatient appropriate because:Unsafe DC plan, awaiting long-term placement.  Dispo:  Patient From:  Home  Planned Disposition:   SNF with palliative care.  Medically stable for disposition.  Expected discharge date: unknown  Medically stable for discharge: Yes  Consultants:  None   Procedures:   None  Antibiotics:  . Unasyn 05/04/2020>8/15  Anti-infectives (From admission, onward)   Start     Dose/Rate Route Frequency Ordered Stop  05/19/20  1230  cefTRIAXone (ROCEPHIN) 1 g in sodium chloride 0.9 % 100 mL IVPB        1 g 200 mL/hr over 30 Minutes Intravenous Every 24 hours 05/19/20 1141 05/21/20 1236   05/04/20 0845  ampicillin-sulbactam (UNASYN) 1.5 g in sodium chloride 0.9 % 100 mL IVPB  Status:  Discontinued        1.5 g 200 mL/hr over 30 Minutes Intravenous Every 6 hours 05/04/20 0838 05/09/20 1140     Subjective: No events overnight  Objective: Vitals:   05/28/20 0541 05/28/20 0950  BP: 137/88 123/74  Pulse: 61 70  Resp:  18  Temp: 98.3 F (36.8 C) 98.6 F (37 C)  SpO2: 100% 100%    Intake/Output Summary (Last 24 hours) at 05/28/2020 1055 Last data filed at 05/28/2020 0936 Gross per 24 hour  Intake 814 ml  Output 1200 ml  Net -386 ml   Filed Weights   05/21/20 0459 05/26/20 0452 05/26/20 2111  Weight: 53.8 kg 51.1 kg 50.2 kg   Body mass index is 21.6 kg/m.   Physical Exam:  General: Pleasant chronically ill cachectic female laying in bed, slow to respond, interactive, oriented to self and place, no distress HEENT: No JVD CVS: S1-S2, regular rate rhythm Lungs: Poor air movement bilaterally, diminished breath sounds at both bases Abdomen: Soft, nontender, bowel sounds present Extremities: No edema  Psych: Flat affect  CNS: Moves all extremities, no localizing signs, positive fine tremors   Data Review: I have personally reviewed the following laboratory data and studies,  CBC: Recent Labs  Lab 05/25/20 0508  WBC 3.7*  HGB 10.2*  HCT 31.9*  MCV 82.0  PLT 970   Basic Metabolic Panel: Recent Labs  Lab 05/22/20 0403 05/23/20 0829 05/24/20 0810 05/25/20 0508  NA 136 136 139 137  K 3.5 4.1 4.5 3.5  CL 107 106 109 106  CO2 19* 18* 19* 20*  GLUCOSE 109* 95 95 95  BUN 12 15 15 17   CREATININE 0.72 0.69 0.79 0.86  CALCIUM 10.2 10.3 10.3 9.8   Liver Function Tests: No results for input(s): AST, ALT, ALKPHOS, BILITOT, PROT, ALBUMIN in the last 168 hours. No results for input(s): LIPASE,  AMYLASE in the last 168 hours. No results for input(s): AMMONIA in the last 168 hours. Cardiac Enzymes: No results for input(s): CKTOTAL, CKMB, CKMBINDEX, TROPONINI in the last 168 hours. BNP (last 3 results) No results for input(s): BNP in the last 8760 hours.  ProBNP (last 3 results) No results for input(s): PROBNP in the last 8760 hours.  CBG: No results for input(s): GLUCAP in the last 168 hours. Recent Results (from the past 240 hour(s))  Culture, Urine     Status: Abnormal   Collection Time: 05/18/20 11:18 AM   Specimen: Urine, Random  Result Value Ref Range Status   Specimen Description URINE, RANDOM  Final   Special Requests   Final    NONE Performed at [facility], [address], [place], [place] [address]    Culture >=100,000 COLONIES/mL ESCHERICHIA COLI (A)  Final   Report Status 05/22/2020 FINAL  Final   Organism ID, Bacteria ESCHERICHIA COLI (A)  Final      Susceptibility   Escherichia coli - MIC*    AMPICILLIN >=32 RESISTANT Resistant     CEFAZOLIN >=64 RESISTANT Resistant     CEFTRIAXONE 1 SENSITIVE Sensitive     CIPROFLOXACIN <=0.25 SENSITIVE Sensitive     GENTAMICIN <=1 SENSITIVE Sensitive  IMIPENEM <=0.25 SENSITIVE Sensitive     NITROFURANTOIN <=16 SENSITIVE Sensitive     TRIMETH/SULFA <=20 SENSITIVE Sensitive     AMPICILLIN/SULBACTAM >=32 RESISTANT Resistant     PIP/TAZO 16 SENSITIVE Sensitive     * >=100,000 COLONIES/mL ESCHERICHIA COLI     Studies: No results found.  Domenic Polite, MD  [facility] 05/28/2020

## 2020-05-30 DIAGNOSIS — R2689 Other abnormalities of gait and mobility: Secondary | ICD-10-CM | POA: Diagnosis not present

## 2020-05-30 DIAGNOSIS — I10 Essential (primary) hypertension: Secondary | ICD-10-CM | POA: Diagnosis not present

## 2020-05-30 DIAGNOSIS — G9341 Metabolic encephalopathy: Secondary | ICD-10-CM | POA: Diagnosis not present

## 2020-05-30 DIAGNOSIS — I711 Thoracic aortic aneurysm, ruptured: Secondary | ICD-10-CM | POA: Diagnosis not present

## 2020-05-30 DIAGNOSIS — G20 Parkinson's disease: Secondary | ICD-10-CM | POA: Diagnosis not present

## 2020-05-30 DIAGNOSIS — R2681 Unsteadiness on feet: Secondary | ICD-10-CM | POA: Diagnosis not present

## 2020-05-30 DIAGNOSIS — R630 Anorexia: Secondary | ICD-10-CM | POA: Diagnosis not present

## 2020-05-30 DIAGNOSIS — F0390 Unspecified dementia without behavioral disturbance: Secondary | ICD-10-CM | POA: Diagnosis not present

## 2020-05-30 DIAGNOSIS — E43 Unspecified severe protein-calorie malnutrition: Secondary | ICD-10-CM | POA: Diagnosis not present

## 2020-05-30 DIAGNOSIS — M6281 Muscle weakness (generalized): Secondary | ICD-10-CM | POA: Diagnosis not present

## 2020-05-30 DIAGNOSIS — I5042 Chronic combined systolic (congestive) and diastolic (congestive) heart failure: Secondary | ICD-10-CM | POA: Diagnosis not present

## 2020-05-30 DIAGNOSIS — Z950 Presence of cardiac pacemaker: Secondary | ICD-10-CM | POA: Diagnosis not present

## 2020-05-30 DIAGNOSIS — R627 Adult failure to thrive: Secondary | ICD-10-CM | POA: Diagnosis not present

## 2020-06-01 ENCOUNTER — Other Ambulatory Visit: Payer: Self-pay

## 2020-06-01 DIAGNOSIS — G9341 Metabolic encephalopathy: Secondary | ICD-10-CM | POA: Diagnosis not present

## 2020-06-01 DIAGNOSIS — F0390 Unspecified dementia without behavioral disturbance: Secondary | ICD-10-CM | POA: Diagnosis not present

## 2020-06-01 DIAGNOSIS — N179 Acute kidney failure, unspecified: Secondary | ICD-10-CM | POA: Diagnosis not present

## 2020-06-01 DIAGNOSIS — E782 Mixed hyperlipidemia: Secondary | ICD-10-CM | POA: Diagnosis not present

## 2020-06-01 DIAGNOSIS — F22 Delusional disorders: Secondary | ICD-10-CM | POA: Diagnosis not present

## 2020-06-01 DIAGNOSIS — I10 Essential (primary) hypertension: Secondary | ICD-10-CM | POA: Diagnosis not present

## 2020-06-01 DIAGNOSIS — N184 Chronic kidney disease, stage 4 (severe): Secondary | ICD-10-CM | POA: Diagnosis not present

## 2020-06-01 DIAGNOSIS — I482 Chronic atrial fibrillation: Secondary | ICD-10-CM | POA: Diagnosis not present

## 2020-06-01 DIAGNOSIS — B961 Klebsiella pneumoniae [K. pneumoniae] as the cause of diseases classified elsewhere: Secondary | ICD-10-CM | POA: Diagnosis not present

## 2020-06-01 DIAGNOSIS — R1310 Dysphagia, unspecified: Secondary | ICD-10-CM | POA: Diagnosis not present

## 2020-06-01 DIAGNOSIS — I639 Cerebral infarction, unspecified: Secondary | ICD-10-CM | POA: Diagnosis not present

## 2020-06-01 DIAGNOSIS — G20 Parkinson's disease: Secondary | ICD-10-CM | POA: Diagnosis not present

## 2020-06-01 NOTE — Patient Outreach (Signed)
  [facility]) Care Management Chronic Special Needs Program    06/01/2020  Name: ALECIA DOI, DOB: 1955/10/30  MRN: [id number]   Ms. Keyah Blizard is enrolled in a chronic special needs plan for Diabetes. Notification received from Natividad Brood, [facility] hospital liaison,  that client transitioned to [facility] long term care skilled nursing facility on 05/28/20.    PLAN; RNCM will continue to follow according to assigned  Chronic special needs program tier level.   [name] RN,BSN,CCM [facility] Care Management [phone]

## 2020-06-03 ENCOUNTER — Encounter: Payer: Self-pay | Admitting: Nurse Practitioner

## 2020-06-03 ENCOUNTER — Non-Acute Institutional Stay: Payer: HMO | Admitting: Nurse Practitioner

## 2020-06-03 DIAGNOSIS — E43 Unspecified severe protein-calorie malnutrition: Secondary | ICD-10-CM

## 2020-06-03 DIAGNOSIS — Z515 Encounter for palliative care: Secondary | ICD-10-CM

## 2020-06-03 NOTE — Progress Notes (Signed)
[facility] Consult Note Telephone: [phone]  Fax: [phone]  PATIENT NAME: [name] [address] [place] [address] [phone] (home)  DOB: 05/06/1956 MRN: [id number]  PRIMARY CARE PROVIDER:    Lujean Amel, MD,  [place] [address] [place] [address] [phone]  REFERRING PROVIDER:   Gaston [name] [phone]  RESPONSIBLE PARTY:   Extended Emergency Contact Information Primary Emergency Contact: [place] Phone: [phone] Mobile Phone: [phone] Relation: Son Secondary Emergency Contact: Cherlynn [place] Mobile Phone: [phone] Relation: Niece  I met face to face with patient and family in home/facility.  ASSESSMENT AND RECOMMENDATIONS:   1. Advance Care Planning/Goals of Care/Code status: Goals include to maximize quality of life and symptom management. Visit at the request of Tiffany Islam, NP for palliative consult. Visit consisted of building trust and discussions on Palliative care medicine as specialized medical care for people living with serious illness, aimed at facilitating better quality of life through symptoms relief, assisting with advance care plan and establishing goals of care. After discussions on ramifications and implications of code status patient elected to be a Full Code. Patient expressed desire CPR and mechanical ventilation if indicated. She indicated desire to live as long as possible. Patient has mild dementia, but coherent. When given ample time she is able to answer questions, would be quiet and refuse to answer questions at time. She however, report her date of birth to be May 26, 1956 which she repeated when asked a second time. Validation provided.  ACP discussed with son [name] he corroborated patient desire, saying patient has always wanted to be a full code with full scope of treatment.  MOST form completed over the phone with son, details include full scope of treatment, Antibiotics if indicated, IV fluid if indicated, feeding tube long-term indicated, form left in the facility for son to sign at his next visit. It was explained to patient and son that code status will be regularly reviewed to ensure that it reflects patient wishes.  2. Symptom Management: Patient has protein calorie malnutrition related to poor oral intake related to progression if her chronic disease. Patient admitted to the facility last week. No prior weight to compare for weight loss. Current weight 112.42lbs, BMI 22kg/m2. Staff report patient not accepting assistance during meals, as she struggles to put food in her mouth due to parkinson. Patient on mechanical soft diet. Discussed with Gaspar Bidding, NP in the facility. Recommended Ensure with meals to suplement nutrition. Facility will substitute with Medpass as facility do not provide Ensure. Staff to assist feed patient at all meals. RD referral recommended.  3. Follow up Palliative Care Visit: Palliative care will continue to provide support to patient, family and the medical team. Return 4 weeks or prn.  4. Family /Caregiver/Community Supports: Son Chrostopher is her RP and visits regularly. Patient report having a brother Trex Schoffstall.  5. Cognitive / Functional decline: Patient is currently dependent on staff for all of her ADLs. Patient speech is slightly garbled but coherent. She is non ambulatory. Son report patient was able to assist in her ADLs before her last hospitalization on 04/13/2020.  I spent 90 minutes providing this consultation, time includes time spent with patient and time on phone with son, chart review, provider coordination, and documentation. More than 50% of the time in this consultation was spent coordinating communication.   HISTORY OF PRESENT ILLNESS:  [name] is a 64  y.o. year old female with multiple medical problems including   Parkinson disease, Dementia, HTN, Chronic diastolic HF, new incidental finding of thoracic aortic aneurysm. Patient was recently hospitalized from 04/13/2020 to 05/28/2020 for severe dehydration. Palliative Care was asked to help address goals of care. This is an initial visit.  CODE STATUS: Full Code  PPS: 30%  HOSPICE ELIGIBILITY/DIAGNOSIS: TBD  PAST MEDICAL HISTORY:  Past Medical History:  Diagnosis Date  . Abnormal liver function tests   . AICD (automatic cardioverter/defibrillator) present    [employer]  . Anxiety   . Aortic stenosis   . Benign neoplasm of colon 04/19/2001   Hyperplastic  . Bicuspid aortic valve   . Cardiomyopathy secondary   . CHF (congestive heart failure) ([facility])   . Coronary artery disease   . Depression   . Headache   . Heart murmur   . HTN (hypertension)   . Hypercalcemia   . Hyperlipidemia   . Memory changes 06/2018   in the last year /per the brother  . S/P ICD (internal cardiac defibrillator) procedure 2018    SOCIAL HX:  Social History   Tobacco Use  . Smoking status: Current Every Day Smoker    Packs/day: 0.25    Years: 40.00    Pack years: 10.00    Types: Cigarettes  . Smokeless tobacco: Never Used  . Tobacco comment: client reports she smokes 1 cigarette per day  Substance Use Topics  . Alcohol use: Yes    Alcohol/week: 0.0 standard drinks    Comment: occasional   FAMILY HX:  Family History  Problem Relation Age of Onset  . Pancreatic cancer Mother   . Colon cancer Maternal Aunt   . Bone cancer Brother   . Heart attack Brother   . Hypercalcemia Neg Hx     ALLERGIES:  Allergies  Allergen Reactions  . Trospium Chloride Er Other (See Comments)    Constipation      PERTINENT MEDICATIONS:  Outpatient Encounter Medications as of 06/03/2020  Medication Sig  . acetaminophen (TYLENOL) 325 MG tablet Take 2 tablets (650 mg total) by mouth every 6 (six) hours as needed for mild pain, fever or headache.  Marland Kitchen amLODipine  (NORVASC) 10 MG tablet Take 1 tablet (10 mg total) by mouth daily.  Marland Kitchen aspirin 325 MG tablet Take 325 mg by mouth daily.  . carbidopa-levodopa (SINEMET IR) 25-100 MG tablet Take 1 tablet by mouth 3 (three) times daily.  . carvedilol (COREG) 12.5 MG tablet Take 12.5 mg by mouth 2 (two) times a day. With food  . divalproex (DEPAKOTE SPRINKLE) 125 MG capsule Take 125 mg by mouth daily.  Marland Kitchen donepezil (ARICEPT) 10 MG tablet Take 1 tablet (10 mg total) by mouth daily.  . feeding supplement, ENSURE ENLIVE, (ENSURE ENLIVE) LIQD Take 237 mLs by mouth 3 (three) times daily between meals.  Marland Kitchen guaifenesin (ROBITUSSIN) 100 MG/5ML syrup Take 200 mg by mouth every 6 (six) hours as needed for cough.   . Multiple Vitamin (MULTIVITAMIN WITH MINERALS) TABS tablet Take 1 tablet by mouth daily.   Marland Kitchen Neomycin-Bacitracin-Polymyxin (TRIPLE ANTIBIOTIC) [phone] OINT Apply 1 application topically as needed (for minor skin tears or abrasions, after cleaning with normal saline, then cover with Band-Aid or gauze and tape).   Vladimir Faster Glycol-Propyl Glycol (SYSTANE OP) Place 1 drop into both eyes daily as needed (for dry eyes.).   Marland Kitchen polyethylene glycol (MIRALAX / GLYCOLAX) 17 g packet Take 17 g by mouth daily.  . rosuvastatin (CRESTOR)  10 MG tablet Take 1 tablet (10 mg total) by mouth daily.  . sertraline (ZOLOFT) 100 MG tablet Take 1 tablet (100 mg total) by mouth daily. (Patient taking differently: Take 100 mg by mouth at bedtime. )  . sodium bicarbonate 650 MG tablet Take 1 tablet (650 mg total) by mouth 2 (two) times daily.   No facility-administered encounter medications on file as of 06/03/2020.    PHYSICAL EXAM / ROS:   Current and past weights: 112.42lb, 27f. BMI 22kg/m2 General: NAD, frail appearing, thin, cooperative Cardiovascular: denied chest pain, no edema, S1S2 normal  Pulmonary: no cough, no increased SOB, room air Abdomen: appetite poor, denied constipation, incontinent of bowel GU: denies dysuria,  incontinent of urine MSK:  no joint and ROM abnormalities, non-ambulatory Skin: no rashes seen on exposed skin Neurological: Weakness, gabbled speech  QJari Favre DNP, AGPCNP-BC

## 2020-06-04 ENCOUNTER — Other Ambulatory Visit: Payer: Self-pay

## 2020-06-04 DIAGNOSIS — G20 Parkinson's disease: Secondary | ICD-10-CM | POA: Diagnosis not present

## 2020-06-04 DIAGNOSIS — E785 Hyperlipidemia, unspecified: Secondary | ICD-10-CM | POA: Diagnosis not present

## 2020-06-04 DIAGNOSIS — I10 Essential (primary) hypertension: Secondary | ICD-10-CM | POA: Diagnosis not present

## 2020-06-04 DIAGNOSIS — F0390 Unspecified dementia without behavioral disturbance: Secondary | ICD-10-CM | POA: Diagnosis not present

## 2020-06-07 DIAGNOSIS — F329 Major depressive disorder, single episode, unspecified: Secondary | ICD-10-CM | POA: Diagnosis not present

## 2020-06-07 DIAGNOSIS — N390 Urinary tract infection, site not specified: Secondary | ICD-10-CM | POA: Diagnosis not present

## 2020-06-07 DIAGNOSIS — I10 Essential (primary) hypertension: Secondary | ICD-10-CM | POA: Diagnosis not present

## 2020-06-07 DIAGNOSIS — G20 Parkinson's disease: Secondary | ICD-10-CM | POA: Diagnosis not present

## 2020-06-07 DIAGNOSIS — E782 Mixed hyperlipidemia: Secondary | ICD-10-CM | POA: Diagnosis not present

## 2020-06-07 DIAGNOSIS — F0390 Unspecified dementia without behavioral disturbance: Secondary | ICD-10-CM | POA: Diagnosis not present

## 2020-06-11 ENCOUNTER — Ambulatory Visit: Payer: Self-pay

## 2020-06-11 DIAGNOSIS — F0390 Unspecified dementia without behavioral disturbance: Secondary | ICD-10-CM | POA: Diagnosis not present

## 2020-06-11 DIAGNOSIS — I10 Essential (primary) hypertension: Secondary | ICD-10-CM | POA: Diagnosis not present

## 2020-06-11 DIAGNOSIS — E785 Hyperlipidemia, unspecified: Secondary | ICD-10-CM | POA: Diagnosis not present

## 2020-06-11 DIAGNOSIS — G20 Parkinson's disease: Secondary | ICD-10-CM | POA: Diagnosis not present

## 2020-06-14 ENCOUNTER — Other Ambulatory Visit: Payer: Self-pay

## 2020-06-14 NOTE — Patient Outreach (Signed)
[facility]) Care Management Chronic Special Needs Program  06/14/2020  Name: [name] DOB: [birth year]  MRN: [id number]  [name] is enrolled in a chronic special needs plan for Diabetes. Telephone call to client's son [name] for CSNP assessment follow up. HIPAA verified by son for client. Son reports client is residing at [facility] long term care facility on 05/28/20.   He reports client requires total care and is on a soft diet. Denies client having any falls since admission to the long term care facility.      Goals Addressed              This Visit's Progress   .   Acknowledge receipt of Advanced Directive package   On track     Client admitted to [facility] long term care facility RN case manager will re-send Advanced Directive to client's son      .  Client understands the importance of follow-up with providers by attending scheduled visits   On track     Client admitted to [facility] long term care facility on 05/28/20 RN case manager will continue to follow     .  Client will report no fall or injuries within the next 3 months (pt-stated)   Not on track     Client has had 5 falls resulting in ED visit admissions (01/20/20, 02/17/20, 02/23/20, 03/02/20 and 03/09/20.  Goal not met Client admitted to [facility] long term care facility on 05/28/20 .      Marland Kitchen  COMPLETED: Client will verbalize knowledge of self management of Hypertension as evidences by BP reading of 140/90 or less; or as defined by provider   On track     Recent blood pressures from 05/28/2020 were 123/74 and 137/88 Client admitted to [facility] long term care facility. Client receiving palliative care services with [facility]     .  Client/Caregiver will verbalize understanding of instructions related to self-care and safety (pt-stated)   Not on track     Client admitted to [facility] long term care facility on 05/28/20  Clients care is being managed by long term care  facility Client receiving palliative care services with [facility]     .  Decrease inpatient Heart Failure admissions/ readmissions with in the year   On track     Ongoing goal Client resides at long term nursing care facility. Clients care is being managed at the facility Client receiving palliative care services with [facility] RN case manager will continue to follow    .  Decrease the use of hospital emergency department related to heart failure within the next year   On track     Ongoing goal Client resides at [facility] long term nursing facility Client receiving palliative care services with [facility]            .  Maintain timely refills of Heart Failure medication as prescribed within the year    On track     Clients medication provided by long term care facility where she currently resides.     .  Obtain annual  Lipid Profile, LDL-C   On track     Lipid panel completed 10/14/18 Client currently residing in [facility] long term care facility Client receiving palliative care services with [facility]     .  COMPLETED: Visit Primary Care Provider or Cardiologist at least 2 times per year        Last Primary care provider visit  09/10/19 ( client currently resides at [facility] long term care facility Last Cardiology visit 07/02/19        Plan:  Send successful outreach letter with a copy of their individualized care plan and Send individual care plan to provider  Chronic care management coordinator will outreach in:  6 Months     Tiffany Plowman RN,BSN,CCM [facility] [phone]     .

## 2020-06-16 DIAGNOSIS — E782 Mixed hyperlipidemia: Secondary | ICD-10-CM | POA: Diagnosis not present

## 2020-06-16 DIAGNOSIS — I10 Essential (primary) hypertension: Secondary | ICD-10-CM | POA: Diagnosis not present

## 2020-06-16 DIAGNOSIS — F329 Major depressive disorder, single episode, unspecified: Secondary | ICD-10-CM | POA: Diagnosis not present

## 2020-06-16 DIAGNOSIS — G20 Parkinson's disease: Secondary | ICD-10-CM | POA: Diagnosis not present

## 2020-06-16 DIAGNOSIS — F0390 Unspecified dementia without behavioral disturbance: Secondary | ICD-10-CM | POA: Diagnosis not present

## 2020-06-16 DIAGNOSIS — R040 Epistaxis: Secondary | ICD-10-CM | POA: Diagnosis not present

## 2020-06-25 DIAGNOSIS — I5042 Chronic combined systolic (congestive) and diastolic (congestive) heart failure: Secondary | ICD-10-CM | POA: Diagnosis not present

## 2020-06-25 DIAGNOSIS — R627 Adult failure to thrive: Secondary | ICD-10-CM | POA: Diagnosis not present

## 2020-06-25 DIAGNOSIS — M6281 Muscle weakness (generalized): Secondary | ICD-10-CM | POA: Diagnosis not present

## 2020-06-25 DIAGNOSIS — I10 Essential (primary) hypertension: Secondary | ICD-10-CM | POA: Diagnosis not present

## 2020-06-25 DIAGNOSIS — R630 Anorexia: Secondary | ICD-10-CM | POA: Diagnosis not present

## 2020-06-25 DIAGNOSIS — Z950 Presence of cardiac pacemaker: Secondary | ICD-10-CM | POA: Diagnosis not present

## 2020-06-25 DIAGNOSIS — R2681 Unsteadiness on feet: Secondary | ICD-10-CM | POA: Diagnosis not present

## 2020-06-25 DIAGNOSIS — E43 Unspecified severe protein-calorie malnutrition: Secondary | ICD-10-CM | POA: Diagnosis not present

## 2020-06-25 DIAGNOSIS — G20 Parkinson's disease: Secondary | ICD-10-CM | POA: Diagnosis not present

## 2020-06-25 DIAGNOSIS — F0390 Unspecified dementia without behavioral disturbance: Secondary | ICD-10-CM | POA: Diagnosis not present

## 2020-06-25 DIAGNOSIS — I711 Thoracic aortic aneurysm, ruptured: Secondary | ICD-10-CM | POA: Diagnosis not present

## 2020-06-25 DIAGNOSIS — R2689 Other abnormalities of gait and mobility: Secondary | ICD-10-CM | POA: Diagnosis not present

## 2020-06-25 DIAGNOSIS — G9341 Metabolic encephalopathy: Secondary | ICD-10-CM | POA: Diagnosis not present

## 2020-07-18 DIAGNOSIS — E46 Unspecified protein-calorie malnutrition: Secondary | ICD-10-CM | POA: Diagnosis not present

## 2020-07-18 DIAGNOSIS — I639 Cerebral infarction, unspecified: Secondary | ICD-10-CM | POA: Diagnosis not present

## 2020-07-18 DIAGNOSIS — E785 Hyperlipidemia, unspecified: Secondary | ICD-10-CM | POA: Diagnosis not present

## 2020-07-18 DIAGNOSIS — I10 Essential (primary) hypertension: Secondary | ICD-10-CM | POA: Diagnosis not present

## 2020-07-18 DIAGNOSIS — I5030 Unspecified diastolic (congestive) heart failure: Secondary | ICD-10-CM | POA: Diagnosis not present

## 2020-07-18 DIAGNOSIS — F0390 Unspecified dementia without behavioral disturbance: Secondary | ICD-10-CM | POA: Diagnosis not present

## 2020-07-18 DIAGNOSIS — G20 Parkinson's disease: Secondary | ICD-10-CM | POA: Diagnosis not present

## 2020-07-26 DIAGNOSIS — R2681 Unsteadiness on feet: Secondary | ICD-10-CM | POA: Diagnosis not present

## 2020-07-26 DIAGNOSIS — E43 Unspecified severe protein-calorie malnutrition: Secondary | ICD-10-CM | POA: Diagnosis not present

## 2020-07-26 DIAGNOSIS — I711 Thoracic aortic aneurysm, ruptured: Secondary | ICD-10-CM | POA: Diagnosis not present

## 2020-07-26 DIAGNOSIS — M6281 Muscle weakness (generalized): Secondary | ICD-10-CM | POA: Diagnosis not present

## 2020-07-26 DIAGNOSIS — I5042 Chronic combined systolic (congestive) and diastolic (congestive) heart failure: Secondary | ICD-10-CM | POA: Diagnosis not present

## 2020-07-26 DIAGNOSIS — G9341 Metabolic encephalopathy: Secondary | ICD-10-CM | POA: Diagnosis not present

## 2020-07-26 DIAGNOSIS — I10 Essential (primary) hypertension: Secondary | ICD-10-CM | POA: Diagnosis not present

## 2020-07-26 DIAGNOSIS — R2689 Other abnormalities of gait and mobility: Secondary | ICD-10-CM | POA: Diagnosis not present

## 2020-07-26 DIAGNOSIS — Z950 Presence of cardiac pacemaker: Secondary | ICD-10-CM | POA: Diagnosis not present

## 2020-07-26 DIAGNOSIS — G20 Parkinson's disease: Secondary | ICD-10-CM | POA: Diagnosis not present

## 2020-07-26 DIAGNOSIS — F0390 Unspecified dementia without behavioral disturbance: Secondary | ICD-10-CM | POA: Diagnosis not present

## 2020-07-26 DIAGNOSIS — R627 Adult failure to thrive: Secondary | ICD-10-CM | POA: Diagnosis not present

## 2020-07-26 DIAGNOSIS — R630 Anorexia: Secondary | ICD-10-CM | POA: Diagnosis not present

## 2020-08-13 DIAGNOSIS — I5030 Unspecified diastolic (congestive) heart failure: Secondary | ICD-10-CM | POA: Diagnosis not present

## 2020-08-13 DIAGNOSIS — G20 Parkinson's disease: Secondary | ICD-10-CM | POA: Diagnosis not present

## 2020-08-13 DIAGNOSIS — I639 Cerebral infarction, unspecified: Secondary | ICD-10-CM | POA: Diagnosis not present

## 2020-08-13 DIAGNOSIS — F0390 Unspecified dementia without behavioral disturbance: Secondary | ICD-10-CM | POA: Diagnosis not present

## 2020-08-13 DIAGNOSIS — I10 Essential (primary) hypertension: Secondary | ICD-10-CM | POA: Diagnosis not present

## 2020-08-13 DIAGNOSIS — E46 Unspecified protein-calorie malnutrition: Secondary | ICD-10-CM | POA: Diagnosis not present

## 2020-08-13 DIAGNOSIS — E785 Hyperlipidemia, unspecified: Secondary | ICD-10-CM | POA: Diagnosis not present

## 2020-08-23 ENCOUNTER — Non-Acute Institutional Stay: Payer: HMO | Admitting: Nurse Practitioner

## 2020-08-23 ENCOUNTER — Other Ambulatory Visit: Payer: Self-pay

## 2020-08-23 DIAGNOSIS — Z515 Encounter for palliative care: Secondary | ICD-10-CM | POA: Diagnosis not present

## 2020-08-23 DIAGNOSIS — R634 Abnormal weight loss: Secondary | ICD-10-CM

## 2020-08-23 NOTE — Progress Notes (Signed)
[facility] Consult Note Telephone: [phone]  Fax: [phone]  PATIENT NAME: [name] [address] [place] [address] [phone] (home)  DOB: 09/05/1956 MRN: [id number]  PRIMARY CARE PROVIDER:    Lujean Amel, MD,  [place] [phone]  REFERRING PROVIDER:   Lujean Amel, MD [address] [place],  [place] [address] [phone]  RESPONSIBLE PARTY:   Extended Emergency Contact Information Primary Emergency Contact: [place] Phone: [phone] Mobile Phone: [phone] Relation: Son Secondary Emergency Contact: Cherlynn [place] Mobile Phone: [phone] Relation: Niece  I met face to face with patient in facility.   ASSESSMENT AND RECOMMENDATIONS:   Advance Care Planning: Goal of care: Goal of care is function. Patient verbalized desire to do as much as possible for herself. Directives: Patient's code status is Full code. She has a signed MOST form on file in the facility and on [place], details of MOST include; Attempt resuscitation, full scope of treatment, antibiotics if indicated, IV fluids if indicated.  Symptom Management:  Weight loss: Patient continues to have ongoing weight loss, had 10lbs weight loss since last palliative care visit 2 months ago.  Staff report poor oral intake and patient insistence on feeding self. Patient on Medpass nutritional supplement TID. No evidence of acute distress note on exam today, patient denied fever or chillls, denied chewing or swallowing difficulty. Recommendation: Continue Medpass dietary supplement. Consider starting patient on appetite stimulant Megace or low dose Remeron 7.73m at bedtime  (hold for sedation). Recommend offering patient healthy meal choices from variety of food groups and encourage snacking between  meals. Dementia: Dementia is stable without report of uncontrollable behavioral concerns. Education provided to nursing on diease trajectory of dementia as a terminal ilness which may lead to weight loss, worsening loss of appetite, decline in cognition and physical function.  Palliative care will continue to provide support to patient, family and the medical team.  Follow up Palliative Care Visit: Palliative care will continue to follow for goals of care clarification and symptom management. Return in about 4-8 weeks or prn.  Family /Caregiver/Community Supports: [facility]. Son Chrostopher is her RP and visits regularly. Patient report having a brother TZiggy Reveles Will call son with update of today's visit.  Cognitive / Functional decline: Patient is dependent on staff for all of her ADLs. Patient speech is slightly garbled but coherent, requires ample time for communication. She is non ambulatory, wheel chair dependent. No report of recent falls, patient able to feed self, it however takes her longer time to complete.  I spent 48 minutes providing this consultation, time includes time spent with patient, chart review, and documentation. More than 50% of the time in this consultation was spent coordinating communication.   CHIEF COMPLAINT: Follow up palliative care visit form 06/03/2020  HISTORY OF PRESENT ILLNESS:  Tiffany BIBIANis a 64y.o. year old female with multiple medical problems including  Parkinson disease, Dementia (FAST 6e), HTN, Chronic diastolic HF, new incidental finding of thoracic aortic aneurysm. Patient was recently hospitalized from 04/13/2020 to 05/28/2020 for severe dehydration. Palliative Care was asked to help address goals of care, advance care planning and symptoms management.   CODE STATUS: Full code  PPS: 30%  HOSPICE ELIGIBILITY/DIAGNOSIS: TBD  PHYSICAL EXAM / ROS:   Current and past weights:  102.6lbs down from 112.42lb, Ht 21f BMI 20kg/m2 General: NAD, frail appearing, thin, sitting wheelchair in her room watching TV Cardiovascular: denied chest pain, no palpitation, no edema, Pace maker noted to left upper chest  Pulmonary: no cough, no SOB, room air GI: appetite poor, denied constipation, incontinent of bowel GU: denies dysuria, incontinent of urine MSK: muscle wasting lower extremeities Skin: no rashes reported or noted to lower extremeities Neurological: Weakness, gabbled speech  PAST MEDICAL HISTORY:  Past Medical History:  Diagnosis Date  . Abnormal liver function tests   . AICD (automatic cardioverter/defibrillator) present    [employer] . Anxiety   . Aortic stenosis   . Benign neoplasm of colon 04/19/2001   Hyperplastic  . Bicuspid aortic valve   . Cardiomyopathy secondary   . CH[place] heart [place]) [place]   . Coronary artery disease   . Depression   . Falls frequently   . Headache   . Heart murmur   . HTN (hypertension)   . Hypercalcemia   . Hyperlipidemia   . Memory changes 06/2018   in the last year /per the brother  . S/P ICD (internal cardiac defibrillator) procedure 2018    SOCIAL HX:  Social History   Tobacco Use  . Smoking status: Former Smoker    Packs/day: 0.25    Years: 40.00    Pack years: 10.00    Types: Cigarettes  . Smokeless tobacco: Never Used  . Tobacco comment: client reports she smokes 1 cigarette per day  Substance Use Topics  . Alcohol use: Yes    Alcohol/week: 0.0 standard drinks    Comment: occasional   FAMILY HX:  Family History  Problem Relation Age of Onset  . Pancreatic cancer Mother   . Colon cancer Maternal Aunt   . Bone cancer Brother   . Heart attack Brother   . Hypercalcemia Neg Hx     ALLERGIES:  Allergies  Allergen Reactions  . Trospium Chloride Er Other (See Comments)    Constipation      PERTINENT MEDICATIONS:  Outpatient Encounter Medications as of 08/23/2020    Medication Sig  . acetaminophen (TYLENOL) 325 MG tablet Take 2 tablets (650 mg total) by mouth every 6 (six) hours as needed for mild pain, fever or headache.  .Marland KitchenamLODipine (NORVASC) 10 MG tablet Take 1 tablet (10 mg total) by mouth daily.  .Marland Kitchenaspirin 325 MG tablet Take 325 mg by mouth daily.  . carbidopa-levodopa (SINEMET IR) 25-100 MG tablet Take 1 tablet by mouth 3 (three) times daily.  . carvedilol (COREG) 12.5 MG tablet Take 12.5 mg by mouth 2 (two) times a day. With food  . divalproex (DEPAKOTE SPRINKLE) 125 MG capsule Take 125 mg by mouth daily.  .Marland Kitchendonepezil (ARICEPT) 10 MG tablet Take 1 tablet (10 mg total) by mouth daily.  . feeding supplement, ENSURE ENLIVE, (ENSURE ENLIVE) LIQD Take 237 mLs by mouth 3 (three) times daily between meals.  .Marland Kitchenguaifenesin (ROBITUSSIN) 100 MG/5ML syrup Take 200 mg by mouth every 6 (six) hours as needed for cough.   . Multiple Vitamin (MULTIVITAMIN WITH MINERALS) TABS tablet Take 1 tablet by mouth daily.   .Marland KitchenNeomycin-Bacitracin-Polymyxin (TRIPLE ANTIBIOTIC) [phone] OINT Apply 1 application topically as needed (for minor skin tears or abrasions, after cleaning with normal saline, then cover with Band-Aid or gauze and tape).   .Vladimir FasterGlycol-Propyl Glycol (SYSTANE OP) Place 1 drop into both eyes daily as needed (for dry eyes.).   .Marland Kitchenpolyethylene glycol (  MIRALAX / GLYCOLAX) 17 g packet Take 17 g by mouth daily.  . rosuvastatin (CRESTOR) 10 MG tablet Take 1 tablet (10 mg total) by mouth daily.  . sertraline (ZOLOFT) 100 MG tablet Take 1 tablet (100 mg total) by mouth daily. (Patient taking differently: Take 100 mg by mouth at bedtime. )  . sodium bicarbonate 650 MG tablet Take 1 tablet (650 mg total) by mouth 2 (two) times daily.   No facility-administered encounter medications on file as of 08/23/2020.     Jari Favre, DNP, AGPCNP-BC

## 2020-08-25 ENCOUNTER — Telehealth: Payer: Self-pay

## 2020-08-25 NOTE — Telephone Encounter (Signed)
The pt son states she is at [facility]. The last nursing home did not send her monitor with her. I contacted Joey from [facility] to send the patient a new monitor. I let the son know that the monitor is on back order and will take a while to get to her.   [name] address: [address] [place] [phone]

## 2020-08-28 DIAGNOSIS — I10 Essential (primary) hypertension: Secondary | ICD-10-CM | POA: Diagnosis not present

## 2020-08-28 DIAGNOSIS — I5042 Chronic combined systolic (congestive) and diastolic (congestive) heart failure: Secondary | ICD-10-CM | POA: Diagnosis not present

## 2020-08-28 DIAGNOSIS — E559 Vitamin D deficiency, unspecified: Secondary | ICD-10-CM | POA: Diagnosis not present

## 2020-08-28 DIAGNOSIS — Z79899 Other long term (current) drug therapy: Secondary | ICD-10-CM | POA: Diagnosis not present

## 2020-08-28 DIAGNOSIS — E43 Unspecified severe protein-calorie malnutrition: Secondary | ICD-10-CM | POA: Diagnosis not present

## 2020-08-31 ENCOUNTER — Other Ambulatory Visit: Payer: Self-pay

## 2020-08-31 DIAGNOSIS — Z20822 Contact with and (suspected) exposure to covid-19: Secondary | ICD-10-CM | POA: Diagnosis not present

## 2020-08-31 NOTE — Patient Outreach (Signed)
  [facility]) Care Management Chronic Special Needs Program    08/31/2020  Name: [name], DOB: 02/15/1956  MRN: [id number]   [name] is enrolled in a chronic special needs plan for Heart Failure. [facility] Management will continue to provide services for this member through 09/24/20.  The [facility] care management team will assume care 09/25/2020.   [name] RN,BSN,CCM [facility] Care Management [phone]

## 2020-09-02 DIAGNOSIS — Z20822 Contact with and (suspected) exposure to covid-19: Secondary | ICD-10-CM | POA: Diagnosis not present

## 2020-09-03 DIAGNOSIS — E46 Unspecified protein-calorie malnutrition: Secondary | ICD-10-CM | POA: Diagnosis not present

## 2020-09-03 DIAGNOSIS — I639 Cerebral infarction, unspecified: Secondary | ICD-10-CM | POA: Diagnosis not present

## 2020-09-03 DIAGNOSIS — G20 Parkinson's disease: Secondary | ICD-10-CM | POA: Diagnosis not present

## 2020-09-03 DIAGNOSIS — I5030 Unspecified diastolic (congestive) heart failure: Secondary | ICD-10-CM | POA: Diagnosis not present

## 2020-09-03 DIAGNOSIS — F0390 Unspecified dementia without behavioral disturbance: Secondary | ICD-10-CM | POA: Diagnosis not present

## 2020-09-03 DIAGNOSIS — E785 Hyperlipidemia, unspecified: Secondary | ICD-10-CM | POA: Diagnosis not present

## 2020-09-03 DIAGNOSIS — I10 Essential (primary) hypertension: Secondary | ICD-10-CM | POA: Diagnosis not present

## 2020-09-28 ENCOUNTER — Other Ambulatory Visit: Payer: Self-pay

## 2020-10-15 DIAGNOSIS — U071 COVID-19: Secondary | ICD-10-CM | POA: Diagnosis not present

## 2020-10-15 DIAGNOSIS — I5030 Unspecified diastolic (congestive) heart failure: Secondary | ICD-10-CM | POA: Diagnosis not present

## 2020-10-15 DIAGNOSIS — G20 Parkinson's disease: Secondary | ICD-10-CM | POA: Diagnosis not present

## 2020-10-15 DIAGNOSIS — I10 Essential (primary) hypertension: Secondary | ICD-10-CM | POA: Diagnosis not present

## 2020-10-15 DIAGNOSIS — E46 Unspecified protein-calorie malnutrition: Secondary | ICD-10-CM | POA: Diagnosis not present

## 2020-10-15 DIAGNOSIS — I639 Cerebral infarction, unspecified: Secondary | ICD-10-CM | POA: Diagnosis not present

## 2020-10-15 DIAGNOSIS — F0390 Unspecified dementia without behavioral disturbance: Secondary | ICD-10-CM | POA: Diagnosis not present

## 2020-10-15 DIAGNOSIS — E785 Hyperlipidemia, unspecified: Secondary | ICD-10-CM | POA: Diagnosis not present

## 2020-10-30 DIAGNOSIS — I10 Essential (primary) hypertension: Secondary | ICD-10-CM | POA: Diagnosis not present

## 2020-10-30 DIAGNOSIS — I711 Thoracic aortic aneurysm, ruptured: Secondary | ICD-10-CM | POA: Diagnosis not present

## 2020-10-30 DIAGNOSIS — R2681 Unsteadiness on feet: Secondary | ICD-10-CM | POA: Diagnosis not present

## 2020-10-30 DIAGNOSIS — R627 Adult failure to thrive: Secondary | ICD-10-CM | POA: Diagnosis not present

## 2020-10-30 DIAGNOSIS — G20 Parkinson's disease: Secondary | ICD-10-CM | POA: Diagnosis not present

## 2020-10-30 DIAGNOSIS — R630 Anorexia: Secondary | ICD-10-CM | POA: Diagnosis not present

## 2020-10-30 DIAGNOSIS — F0390 Unspecified dementia without behavioral disturbance: Secondary | ICD-10-CM | POA: Diagnosis not present

## 2020-10-30 DIAGNOSIS — M6281 Muscle weakness (generalized): Secondary | ICD-10-CM | POA: Diagnosis not present

## 2020-10-30 DIAGNOSIS — Z950 Presence of cardiac pacemaker: Secondary | ICD-10-CM | POA: Diagnosis not present

## 2020-10-30 DIAGNOSIS — E43 Unspecified severe protein-calorie malnutrition: Secondary | ICD-10-CM | POA: Diagnosis not present

## 2020-10-30 DIAGNOSIS — I5042 Chronic combined systolic (congestive) and diastolic (congestive) heart failure: Secondary | ICD-10-CM | POA: Diagnosis not present

## 2020-11-03 ENCOUNTER — Encounter: Payer: Self-pay | Admitting: Gastroenterology

## 2020-11-03 DIAGNOSIS — F329 Major depressive disorder, single episode, unspecified: Secondary | ICD-10-CM | POA: Diagnosis not present

## 2020-11-03 DIAGNOSIS — G9341 Metabolic encephalopathy: Secondary | ICD-10-CM | POA: Diagnosis not present

## 2020-11-03 DIAGNOSIS — I482 Chronic atrial fibrillation, unspecified: Secondary | ICD-10-CM | POA: Diagnosis not present

## 2020-11-03 DIAGNOSIS — F0390 Unspecified dementia without behavioral disturbance: Secondary | ICD-10-CM | POA: Diagnosis not present

## 2020-11-03 DIAGNOSIS — G20 Parkinson's disease: Secondary | ICD-10-CM | POA: Diagnosis not present

## 2020-11-12 DIAGNOSIS — I5030 Unspecified diastolic (congestive) heart failure: Secondary | ICD-10-CM | POA: Diagnosis not present

## 2020-11-12 DIAGNOSIS — F0390 Unspecified dementia without behavioral disturbance: Secondary | ICD-10-CM | POA: Diagnosis not present

## 2020-11-12 DIAGNOSIS — E785 Hyperlipidemia, unspecified: Secondary | ICD-10-CM | POA: Diagnosis not present

## 2020-11-12 DIAGNOSIS — I639 Cerebral infarction, unspecified: Secondary | ICD-10-CM | POA: Diagnosis not present

## 2020-11-12 DIAGNOSIS — G20 Parkinson's disease: Secondary | ICD-10-CM | POA: Diagnosis not present

## 2020-11-12 DIAGNOSIS — I10 Essential (primary) hypertension: Secondary | ICD-10-CM | POA: Diagnosis not present

## 2020-11-12 DIAGNOSIS — U071 COVID-19: Secondary | ICD-10-CM | POA: Diagnosis not present

## 2020-11-12 DIAGNOSIS — E46 Unspecified protein-calorie malnutrition: Secondary | ICD-10-CM | POA: Diagnosis not present

## 2020-11-16 ENCOUNTER — Observation Stay (HOSPITAL_COMMUNITY): Payer: HMO

## 2020-11-16 ENCOUNTER — Inpatient Hospital Stay (HOSPITAL_COMMUNITY)
Admission: EM | Admit: 2020-11-16 | Discharge: 2020-11-26 | DRG: 640 | Disposition: A | Discharge status: Hospice | Payer: HMO | Source: Skilled Nursing Facility | Attending: Internal Medicine | Admitting: Internal Medicine

## 2020-11-16 ENCOUNTER — Encounter (HOSPITAL_COMMUNITY): Payer: Self-pay | Admitting: Internal Medicine

## 2020-11-16 ENCOUNTER — Emergency Department (HOSPITAL_COMMUNITY): Payer: HMO

## 2020-11-16 DIAGNOSIS — Z95810 Presence of automatic (implantable) cardiac defibrillator: Secondary | ICD-10-CM | POA: Diagnosis not present

## 2020-11-16 DIAGNOSIS — J9601 Acute respiratory failure with hypoxia: Secondary | ICD-10-CM | POA: Diagnosis not present

## 2020-11-16 DIAGNOSIS — Z7401 Bed confinement status: Secondary | ICD-10-CM | POA: Diagnosis not present

## 2020-11-16 DIAGNOSIS — J690 Pneumonitis due to inhalation of food and vomit: Secondary | ICD-10-CM | POA: Diagnosis not present

## 2020-11-16 DIAGNOSIS — E43 Unspecified severe protein-calorie malnutrition: Secondary | ICD-10-CM | POA: Diagnosis present

## 2020-11-16 DIAGNOSIS — E785 Hyperlipidemia, unspecified: Secondary | ICD-10-CM | POA: Diagnosis present

## 2020-11-16 DIAGNOSIS — N390 Urinary tract infection, site not specified: Secondary | ICD-10-CM | POA: Diagnosis present

## 2020-11-16 DIAGNOSIS — R1310 Dysphagia, unspecified: Secondary | ICD-10-CM | POA: Diagnosis present

## 2020-11-16 DIAGNOSIS — R296 Repeated falls: Secondary | ICD-10-CM | POA: Diagnosis present

## 2020-11-16 DIAGNOSIS — Z8616 Personal history of COVID-19: Secondary | ICD-10-CM | POA: Diagnosis not present

## 2020-11-16 DIAGNOSIS — R64 Cachexia: Secondary | ICD-10-CM | POA: Diagnosis present

## 2020-11-16 DIAGNOSIS — R627 Adult failure to thrive: Secondary | ICD-10-CM | POA: Diagnosis present

## 2020-11-16 DIAGNOSIS — I5032 Chronic diastolic (congestive) heart failure: Secondary | ICD-10-CM | POA: Diagnosis not present

## 2020-11-16 DIAGNOSIS — Z681 Body mass index (BMI) 19 or less, adult: Secondary | ICD-10-CM | POA: Diagnosis not present

## 2020-11-16 DIAGNOSIS — I5022 Chronic systolic (congestive) heart failure: Secondary | ICD-10-CM | POA: Diagnosis present

## 2020-11-16 DIAGNOSIS — D696 Thrombocytopenia, unspecified: Secondary | ICD-10-CM | POA: Diagnosis present

## 2020-11-16 DIAGNOSIS — Z7189 Other specified counseling: Secondary | ICD-10-CM | POA: Diagnosis not present

## 2020-11-16 DIAGNOSIS — Z515 Encounter for palliative care: Secondary | ICD-10-CM

## 2020-11-16 DIAGNOSIS — R0602 Shortness of breath: Secondary | ICD-10-CM | POA: Diagnosis not present

## 2020-11-16 DIAGNOSIS — R0989 Other specified symptoms and signs involving the circulatory and respiratory systems: Secondary | ICD-10-CM | POA: Diagnosis not present

## 2020-11-16 DIAGNOSIS — I6381 Other cerebral infarction due to occlusion or stenosis of small artery: Secondary | ICD-10-CM | POA: Diagnosis not present

## 2020-11-16 DIAGNOSIS — F0280 Dementia in other diseases classified elsewhere without behavioral disturbance: Secondary | ICD-10-CM | POA: Diagnosis present

## 2020-11-16 DIAGNOSIS — U071 COVID-19: Secondary | ICD-10-CM

## 2020-11-16 DIAGNOSIS — A419 Sepsis, unspecified organism: Secondary | ICD-10-CM | POA: Diagnosis not present

## 2020-11-16 DIAGNOSIS — Z66 Do not resuscitate: Secondary | ICD-10-CM | POA: Diagnosis not present

## 2020-11-16 DIAGNOSIS — D649 Anemia, unspecified: Secondary | ICD-10-CM | POA: Diagnosis present

## 2020-11-16 DIAGNOSIS — I10 Essential (primary) hypertension: Secondary | ICD-10-CM | POA: Diagnosis not present

## 2020-11-16 DIAGNOSIS — Z953 Presence of xenogenic heart valve: Secondary | ICD-10-CM

## 2020-11-16 DIAGNOSIS — I959 Hypotension, unspecified: Secondary | ICD-10-CM | POA: Diagnosis not present

## 2020-11-16 DIAGNOSIS — I110 Hypertensive heart disease with heart failure: Secondary | ICD-10-CM | POA: Diagnosis present

## 2020-11-16 DIAGNOSIS — M2550 Pain in unspecified joint: Secondary | ICD-10-CM | POA: Diagnosis not present

## 2020-11-16 DIAGNOSIS — N179 Acute kidney failure, unspecified: Secondary | ICD-10-CM | POA: Diagnosis present

## 2020-11-16 DIAGNOSIS — R4182 Altered mental status, unspecified: Secondary | ICD-10-CM | POA: Diagnosis not present

## 2020-11-16 DIAGNOSIS — I2510 Atherosclerotic heart disease of native coronary artery without angina pectoris: Secondary | ICD-10-CM | POA: Diagnosis present

## 2020-11-16 DIAGNOSIS — G20 Parkinson's disease: Secondary | ICD-10-CM | POA: Diagnosis present

## 2020-11-16 DIAGNOSIS — R54 Age-related physical debility: Secondary | ICD-10-CM | POA: Diagnosis present

## 2020-11-16 DIAGNOSIS — Z952 Presence of prosthetic heart valve: Secondary | ICD-10-CM

## 2020-11-16 DIAGNOSIS — N184 Chronic kidney disease, stage 4 (severe): Secondary | ICD-10-CM | POA: Diagnosis not present

## 2020-11-16 DIAGNOSIS — E870 Hyperosmolality and hypernatremia: Secondary | ICD-10-CM | POA: Diagnosis present

## 2020-11-16 DIAGNOSIS — Z789 Other specified health status: Secondary | ICD-10-CM | POA: Diagnosis not present

## 2020-11-16 DIAGNOSIS — G9341 Metabolic encephalopathy: Secondary | ICD-10-CM | POA: Diagnosis present

## 2020-11-16 DIAGNOSIS — Z800 Family history of malignant neoplasm of digestive organs: Secondary | ICD-10-CM

## 2020-11-16 DIAGNOSIS — E876 Hypokalemia: Secondary | ICD-10-CM | POA: Diagnosis not present

## 2020-11-16 DIAGNOSIS — Z8249 Family history of ischemic heart disease and other diseases of the circulatory system: Secondary | ICD-10-CM

## 2020-11-16 DIAGNOSIS — E860 Dehydration: Secondary | ICD-10-CM | POA: Diagnosis present

## 2020-11-16 LAB — BASIC METABOLIC PANEL
Anion gap: 10 (ref 5–15)
BUN: 42 mg/dL — ABNORMAL HIGH (ref 8–23)
CO2: 23 mmol/L (ref 22–32)
Calcium: 10.5 mg/dL — ABNORMAL HIGH (ref 8.9–10.3)
Chloride: 129 mmol/L — ABNORMAL HIGH (ref 98–111)
Creatinine, Ser: 0.99 mg/dL (ref 0.44–1.00)
GFR, Estimated: >60 mL/min (ref >60)
Glucose, Bld: 117 mg/dL — ABNORMAL HIGH (ref 70–99)
Potassium: 3.5 mmol/L (ref 3.5–5.1)
Sodium: 162 mmol/L (ref 135–145)

## 2020-11-16 LAB — URINALYSIS, ROUTINE W REFLEX MICROSCOPIC
Bilirubin Urine: NEGATIVE
Glucose, UA: NEGATIVE mg/dL
Hgb urine dipstick: NEGATIVE
Ketones, ur: NEGATIVE mg/dL
Nitrite: NEGATIVE
Protein, ur: 100 mg/dL — AB
Specific Gravity, Urine: 1.027 (ref 1.005–1.030)
Squamous Epithelial / HPF: >50 — ABNORMAL HIGH (ref 0–5)
WBC, UA: >50 WBC/hpf — ABNORMAL HIGH (ref 0–5)
pH: 5 (ref 5.0–8.0)

## 2020-11-16 LAB — CBC WITH DIFFERENTIAL/PLATELET
Abs Immature Granulocytes: 0.04 10*3/uL (ref 0.00–0.07)
Basophils Absolute: 0 10*3/uL (ref 0.0–0.1)
Basophils Relative: 0 %
Eosinophils Absolute: 0.1 10*3/uL (ref 0.0–0.5)
Eosinophils Relative: 1 %
HCT: 46.6 % — ABNORMAL HIGH (ref 36.0–46.0)
Hemoglobin: 14.2 g/dL (ref 12.0–15.0)
Immature Granulocytes: 1 %
Lymphocytes Relative: 24 %
Lymphs Abs: 2.1 10*3/uL (ref 0.7–4.0)
MCH: 28.1 pg (ref 26.0–34.0)
MCHC: 30.5 g/dL (ref 30.0–36.0)
MCV: 92.1 fL (ref 80.0–100.0)
Monocytes Absolute: 0.7 10*3/uL (ref 0.1–1.0)
Monocytes Relative: 8 %
Neutro Abs: 5.9 10*3/uL (ref 1.7–7.7)
Neutrophils Relative %: 66 %
Platelets: 86 10*3/uL — ABNORMAL LOW (ref 150–400)
RBC: 5.06 MIL/uL (ref 3.87–5.11)
RDW: 20.1 % — ABNORMAL HIGH (ref 11.5–15.5)
WBC: 8.9 10*3/uL (ref 4.0–10.5)
nRBC: 0 % (ref 0.0–0.2)

## 2020-11-16 LAB — COMPREHENSIVE METABOLIC PANEL
ALT: 49 U/L — ABNORMAL HIGH (ref 0–44)
AST: 52 U/L — ABNORMAL HIGH (ref 15–41)
Albumin: 3.7 g/dL (ref 3.5–5.0)
Alkaline Phosphatase: 63 U/L (ref 38–126)
BUN: 45 mg/dL — ABNORMAL HIGH (ref 8–23)
CO2: 19 mmol/L — ABNORMAL LOW (ref 22–32)
Calcium: 10.9 mg/dL — ABNORMAL HIGH (ref 8.9–10.3)
Chloride: >130 mmol/L (ref 98–111)
Creatinine, Ser: 1.23 mg/dL — ABNORMAL HIGH (ref 0.44–1.00)
GFR, Estimated: 48 mL/min — ABNORMAL LOW (ref >60)
Glucose, Bld: 127 mg/dL — ABNORMAL HIGH (ref 70–99)
Potassium: 3.2 mmol/L — ABNORMAL LOW (ref 3.5–5.1)
Sodium: 163 mmol/L (ref 135–145)
Total Bilirubin: 0.9 mg/dL (ref 0.3–1.2)
Total Protein: 8.3 g/dL — ABNORMAL HIGH (ref 6.5–8.1)

## 2020-11-16 LAB — SODIUM, URINE, RANDOM: Sodium, Ur: 24 mmol/L

## 2020-11-16 LAB — RESP PANEL BY RT-PCR (FLU A&B, COVID) ARPGX2
Influenza A by PCR: NEGATIVE
Influenza B by PCR: NEGATIVE
SARS Coronavirus 2 by RT PCR: POSITIVE — AB

## 2020-11-16 LAB — OSMOLALITY: Osmolality: 355 mOsm/kg (ref 275–295)

## 2020-11-16 LAB — PROTIME-INR
INR: 1.2 (ref 0.8–1.2)
Prothrombin Time: 14.3 seconds (ref 11.4–15.2)

## 2020-11-16 LAB — HIV ANTIBODY (ROUTINE TESTING W REFLEX): HIV Screen 4th Generation wRfx: NONREACTIVE

## 2020-11-16 LAB — T4, FREE: Free T4: 1.05 ng/dL (ref 0.61–1.12)

## 2020-11-16 LAB — LACTIC ACID, PLASMA: Lactic Acid, Venous: 1.9 mmol/L (ref 0.5–1.9)

## 2020-11-16 LAB — TSH: TSH: 1.103 u[IU]/mL (ref 0.350–4.500)

## 2020-11-16 LAB — CBG MONITORING, ED: Glucose-Capillary: 114 mg/dL — ABNORMAL HIGH (ref 70–99)

## 2020-11-16 LAB — OSMOLALITY, URINE: Osmolality, Ur: 884 mOsm/kg (ref 300–900)

## 2020-11-16 LAB — APTT: aPTT: 29 seconds (ref 24–36)

## 2020-11-16 MED ORDER — ALBUTEROL SULFATE (2.5 MG/3ML) 0.083% IN NEBU: 2.5000 mg | INHALATION_SOLUTION | RESPIRATORY_TRACT | Status: DC | PRN

## 2020-11-16 MED ORDER — VANCOMYCIN HCL 1000 MG/200ML IV SOLN
1000.0000 mg | Freq: Once | INTRAVENOUS | Status: AC
  Administered 2020-11-16: 1000 mg via INTRAVENOUS
  Filled 2020-11-16: qty 200

## 2020-11-16 MED ORDER — ONDANSETRON HCL 4 MG PO TABS: 4.0000 mg | ORAL_TABLET | Freq: Four times a day (QID) | ORAL | Status: DC | PRN

## 2020-11-16 MED ORDER — ACETAMINOPHEN 650 MG RE SUPP: 650.0000 mg | Freq: Four times a day (QID) | RECTAL | Status: DC | PRN

## 2020-11-16 MED ORDER — VANCOMYCIN HCL 500 MG/100ML IV SOLN: 500.0000 mg | INTRAVENOUS | Status: DC

## 2020-11-16 MED ORDER — SODIUM CHLORIDE 0.9 % IV SOLN: 2.0000 g | Freq: Two times a day (BID) | INTRAVENOUS | Status: DC

## 2020-11-16 MED ORDER — LACTATED RINGERS IV BOLUS
500.0000 mL | Freq: Once | INTRAVENOUS | Status: AC
  Administered 2020-11-16: 500 mL via INTRAVENOUS

## 2020-11-16 MED ORDER — DEXTROSE 5 % IV SOLN: INTRAVENOUS | Status: DC

## 2020-11-16 MED ORDER — MORPHINE SULFATE (PF) 2 MG/ML IV SOLN
2.0000 mg | INTRAVENOUS | Status: DC | PRN
  Administered 2020-11-23 – 2020-11-26 (×4): 2 mg via INTRAVENOUS
  Filled 2020-11-16 (×4): qty 1

## 2020-11-16 MED ORDER — SODIUM CHLORIDE 0.9 % IV SOLN
2.0000 g | Freq: Once | INTRAVENOUS | Status: AC
  Administered 2020-11-16: 2 g via INTRAVENOUS
  Filled 2020-11-16: qty 2

## 2020-11-16 MED ORDER — HYDRALAZINE HCL 20 MG/ML IJ SOLN: 5.0000 mg | INTRAMUSCULAR | Status: DC | PRN

## 2020-11-16 MED ORDER — HYPROMELLOSE (GONIOSCOPIC) 2.5 % OP SOLN
1.0000 [drp] | Freq: Every day | OPHTHALMIC | Status: DC | PRN
  Administered 2020-11-23 – 2020-11-24 (×2): 1 [drp] via OPHTHALMIC

## 2020-11-16 MED ORDER — LACTATED RINGERS IV BOLUS (SEPSIS)
1000.0000 mL | Freq: Once | INTRAVENOUS | Status: AC
  Administered 2020-11-16: 1000 mL via INTRAVENOUS

## 2020-11-16 MED ORDER — SODIUM CHLORIDE 0.9 % IV SOLN: Freq: Once | INTRAVENOUS | Status: DC

## 2020-11-16 MED ORDER — SODIUM CHLORIDE 0.9 % IV SOLN
2.0000 g | INTRAVENOUS | Status: DC
  Administered 2020-11-17: 2 g via INTRAVENOUS
  Filled 2020-11-16: qty 2

## 2020-11-16 MED ORDER — ACETAMINOPHEN 650 MG RE SUPP
650.0000 mg | Freq: Once | RECTAL | Status: AC
  Administered 2020-11-16: 650 mg via RECTAL
  Filled 2020-11-16: qty 1

## 2020-11-16 MED ORDER — SODIUM CHLORIDE 0.9% FLUSH
3.0000 mL | Freq: Two times a day (BID) | INTRAVENOUS | Status: DC
  Administered 2020-11-16 – 2020-11-26 (×19): 3 mL via INTRAVENOUS

## 2020-11-16 MED ORDER — LACTATED RINGERS IV SOLN: INTRAVENOUS | Status: DC

## 2020-11-16 MED ORDER — ENOXAPARIN SODIUM 30 MG/0.3ML ~~LOC~~ SOLN: 30.0000 mg | SUBCUTANEOUS | Status: DC

## 2020-11-16 MED ORDER — BISACODYL 10 MG RE SUPP: 10.0000 mg | Freq: Every day | RECTAL | Status: DC | PRN

## 2020-11-16 MED ORDER — ONDANSETRON HCL 4 MG/2ML IJ SOLN: 4.0000 mg | Freq: Four times a day (QID) | INTRAMUSCULAR | Status: DC | PRN

## 2020-11-16 MED ORDER — ACETAMINOPHEN 325 MG PO TABS: 650.0000 mg | ORAL_TABLET | Freq: Four times a day (QID) | ORAL | Status: DC | PRN

## 2020-11-16 NOTE — ED Notes (Signed)
MD made aware of critical lab.

## 2020-11-16 NOTE — Consult Note (Signed)
Consultation Note Date: 11/16/2020   Patient Name: [name]  DOB: 07-31-1956  MRN: [id number]  Age / Sex: 65 y.o., female  PCP: Lujean Amel, MD Referring Physician: Karmen Bongo, MD  Reason for Consultation: Establishing goals of care  HPI/Patient Profile: 65 y.o. female  with past medical history of Parkinson's dementia, hypertension, hyperlipidemia, AICD placement, CAD, and chronic systolic CHF who presented to the emergency department on 11/16/2020 with altered mental status. Per intake H&P, patient is verbal with 1-2 words and is frail and cachectic. Family reports she had a hip fracture about a year ago and has been non-ambulatory since. She was COVID positive 2 weeks ago.  ED Course: Reportedly more confused this morning, possible aspiration event. Febrile to 101.5, urinalysis suspicious for UTI but no evidence of sepsis. Sodium 163. Given IV fluid and tylenol. Family considering change in code status. Neurology recommending EEG.   Clinical Assessment and Goals of Care: I have reviewed medical records including EPIC notes, labs and imaging, and examined the patient. She can communicate a couple words at a time, although difficult to understand. She does follow commands.   I called son [name] to discuss diagnosis, prognosis, GOC, EOL wishes, disposition, and options. I introduced Palliative Medicine as specialized medical care for people living with serious illness. It focuses on providing relief from the symptoms and stress of a serious illness.   We discussed a brief life review of the patient. She has lived all her life in [place]. She is retired from a long career as a Control and instrumentation engineer. She never married. [name] is her only son.   Son shares that patient has suffered frequent falls. I provided education this is common with Parkinson's dementia. Patient has been at [facility] since  April 2021, when she fractured her femur after a fall.  As far as functional status, she has essentially been bed-bound since and dependent for all ADLs.   We discussed her current illness and what it means in the larger context of her ongoing co-morbidities.  Provided education and counseling on the natural disease trajectory of Parkinson's dementia. This includes decreased ability to communicate, ambulate, swallow, and maintain continence. Discussed that it is ultimately a terminal condition.  Discussed that patient has dysphagia, which includes risk for aspiration. Discussed that aspiration pneumonia is often a recurrent condition. Provided education that artificial feeding does not eliminate the risk of aspiration and has not been shown to prolong life or promote quality of life in patients with dysphagia from Parkinson's dementia.   I attempted to elicit values and goals of care important to the patient. [name] expresses that is is difficult to see is mother in her current condition, especially since she was always a very active person. He shares the she "went downhill so quickly" and he was no longer able to care for her at home, so needed to place her in a nursing home. He expresses frustration that she has not received good care at the nursing home.   The difference between aggressive  medical intervention and comfort care was considered in light of the patient's goals of care.  I introduced the concept of a comfort path to [name] and encouraged him to think about at what point he would want to stop supportive medical interventions and focus on her comfort and dignity rather than prolonging life. Introduced hospice philosophy and information on services at SNF vs residential hospice services. Discussed that hospice would provide additional care and support at the SNF. Son does ask if patient is "end of life". I answer that yes her prognosis is likely less than 6 months.   We did discuss  code status. Encouraged son to consider DNR/DNI status understanding evidenced based poor outcomes in similar hospitalized patients, as the cause of the arrest is likely associated with chronic/terminal disease rather than a reversible acute cardio-pulmonary event. I explained that DNR/DNI does not change the medical plan and it only comes into effect after a person has arrested (died).  It is a protective measure to keep [place] from harming the patient in their last moments of life. He seems receptive to this discussion however is not yet ready to make that decision.  Questions and concerns were addressed.  The family was encouraged to call with questions or concerns.    Primary decision maker: son [name] with support from patient's brother    SUMMARY OF RECOMMENDATIONS    Full code full scope treatment  Continue current medical care  Have strongly recommended DNR status  Patient should be eligible to receive [facility] care [facility]  Plan for follow-up discussion tomorrow after 3pm  Code Status/Advance Care Planning:  Full code  Symptom Management:   Per primary team  Palliative Prophylaxis:   Aspiration, Oral Care and Turn Reposition  Additional Recommendations (Limitations, Scope, Preferences):  Full Scope Treatment  Psycho-social/Spiritual:   Created space and opportunity for family to express thoughts and feelings regarding patient's current medical situation.   Emotional support provided   Prognosis:   < 6 months  Discharge Planning: To Be Determined      Primary Diagnoses: Present on Admission: . Acute metabolic encephalopathy . Hyperlipidemia . Essential hypertension . Automatic implantable cardioverter-defibrillator in situ . Chronic diastolic CHF (congestive heart failure) ([place]) . Dementia due to Parkinson's disease without behavioral disturbance ([place]) . AKI (acute kidney injury) ([place])   I have reviewed the medical record, interviewed the patient  and family, and examined the patient. The following aspects are pertinent.  Past Medical History:  Diagnosis Date  . Abnormal liver function tests   . AICD (automatic cardioverter/defibrillator) present    [employer]  . Anxiety   . Aortic stenosis   . Benign neoplasm of colon 04/19/2001   Hyperplastic  . Bicuspid aortic valve   . Cardiomyopathy secondary   . CHF (congestive heart failure) ([place])   . Coronary artery disease   . Depression   . Falls frequently   . Headache   . Heart murmur   . HTN (hypertension)   . Hypercalcemia   . Hyperlipidemia   . Memory changes 06/2018   in the last year /per the brother    Family History  Problem Relation Age of Onset  . Pancreatic cancer Mother   . Colon cancer Maternal Aunt   . Bone cancer Brother   . Heart attack Brother   . Hypercalcemia Neg Hx    Scheduled Meds: . sodium chloride flush  3 mL Intravenous Q12H   Continuous Infusions: . [START ON 11/17/2020] ceFEPime (MAXIPIME)  IV    . dextrose     PRN Meds:.acetaminophen **OR** acetaminophen, albuterol, bisacodyl, hydrALAZINE, hydroxypropyl methylcellulose / hypromellose, morphine injection, ondansetron **OR** ondansetron (ZOFRAN) IV Medications Prior to Admission:  Prior to Admission medications   Medication Sig Start Date End Date Taking? Authorizing Provider  acetaminophen (TYLENOL) 325 MG tablet Take 2 tablets (650 mg total) by mouth every 6 (six) hours as needed for mild pain, fever or headache. 05/28/20  Yes Domenic Polite, MD  amLODipine (NORVASC) 10 MG tablet Take 1 tablet (10 mg total) by mouth daily. 05/29/20  Yes Domenic Polite, MD  aspirin 81 MG EC tablet Take 81 mg by mouth daily.   Yes [provider]  carbidopa-levodopa (SINEMET IR) 25-100 MG tablet Take 1 tablet by mouth 3 (three) times daily. 01/02/20 03/09/29 Yes Arrien, Jimmy Picket, MD  carvedilol (COREG) 12.5 MG tablet Take 12.5 mg by mouth 2 (two) times a day. 12/16/18  Yes [provider]  divalproex (DEPAKOTE) 125 MG DR tablet Take 125 mg by mouth daily.   Yes [provider]  donepezil (ARICEPT) 10 MG tablet Take 1 tablet (10 mg total) by mouth daily. 01/02/20 03/09/29 Yes Arrien, Jimmy Picket, MD  feeding supplement, ENSURE ENLIVE, (ENSURE ENLIVE) LIQD Take 237 mLs by mouth 3 (three) times daily between meals. 05/28/20  Yes Domenic Polite, MD  guaifenesin (ROBITUSSIN) 100 MG/5ML syrup Take 200 mg by mouth every 6 (six) hours as needed for cough.    Yes [provider]  Multiple Vitamins-Minerals (THERAPEUTIC M PO) Take 1 tablet by mouth daily.   Yes [provider]  Neomycin-Bacitracin-Polymyxin (TRIPLE ANTIBIOTIC) [phone] OINT Apply 1 application topically daily as needed (for minor skin tears or abrasions, after cleaning with normal saline, then cover with Band-Aid or gauze and tape).   Yes [provider]  polyethylene glycol (MIRALAX / GLYCOLAX) 17 g packet Take 17 g by mouth daily.   Yes [provider]  Propylene Glycol (SYSTANE BALANCE) 0.6 % SOLN Apply 1 drop to eye daily as needed (dry eyes).   Yes [provider]  rosuvastatin (CRESTOR) 10 MG tablet Take 1 tablet (10 mg total) by mouth daily. 05/01/11  Yes Evans Lance, MD  sertraline (ZOLOFT) 100 MG tablet Take 1 tablet (100 mg total) by mouth daily. 02/28/19  Yes Cameron Sprang, MD  sodium bicarbonate 650 MG tablet Take 1 tablet (650 mg total) by mouth 2 (two) times daily. 05/28/20  Yes Domenic Polite, MD   Allergies  Allergen Reactions  . Sertraline Hcl Other (See Comments)    Swelling. Pt can take now - Per MAR  . Trospium Chloride Er Other (See Comments)    Constipation - Per MAR   Review of Systems  Unable to perform ROS: Dementia    Physical Exam Vitals reviewed.  Constitutional:      General: She is not in acute distress.    Appearance: She is ill-appearing.     Comments: frail  Cardiovascular:     Rate and Rhythm: Normal rate.   Pulmonary:     Effort: Pulmonary effort is normal.  Neurological:     Mental Status: She is alert.     Motor: Weakness present.     Comments: Limited speech Follows commands     Vital Signs: BP 120/84   Pulse (!) 56   Temp 98.8 F (37.1 C) (Rectal)   Resp 15   Ht 5' (1.524 m)   Wt 43 kg   SpO2 99%  BMI 18.51 kg/m  Pain Scale: PAINAD       SpO2: SpO2: 99 % O2 Device:SpO2: 99 % O2 Flow Rate: .O2 Flow Rate (L/min): 2 L/min  IO: Intake/output summary:   Intake/Output Summary (Last 24 hours) at 11/16/2020 1924 Last data filed at 11/16/2020 1751 Gross per 24 hour  Intake 1845.99 ml  Output 200 ml  Net 1645.99 ml    LBM:   Baseline Weight: Weight: 43 kg Most recent weight: Weight: 43 kg      Palliative Assessment/Data: PPS 30%     Time In: 18:50 Time Out: 19:45 Time Total: 55 minutes Greater than 50%  of this time was spent counseling and coordinating care related to the above assessment and plan.  Signed by: Lavena Bullion, NP   Please contact Palliative Medicine Team phone at [phone] for questions and concerns.  For individual provider: See Shea Evans

## 2020-11-16 NOTE — ED Provider Notes (Addendum)
[facility] EMERGENCY DEPARTMENT Provider Note   CSN: [id number] Arrival date & time:        History Chief Complaint  Patient presents with  . Altered Mental Status    [name] is a 65 y.o. female.  Level 5 caveat due to altered mental status. Patient arrives from nursing home with altered mental status. Had change in mental status supposedly this morning. Had some possible aspiration event. Had some black coming up from her mouth. Gastroccult was negative. Patient has history of Parkinson's and dementia. Sometimes will follow commands well. She is bedbound per son. Son states that she is still full code. Patient with possibly a history of seizures but no one can confirm. Medication list does show Depakote per EMS.  The history is provided by a caregiver.  Altered Mental Status Presenting symptoms: confusion and unresponsiveness   Most recent episode:  Today Episode history:  Single Timing:  Constant Progression:  Unchanged Chronicity:  New Context: dementia (parkinsons, qu4estional seziure)        Past Medical History:  Diagnosis Date  . Abnormal liver function tests   . AICD (automatic cardioverter/defibrillator) present    [employer]  . Anxiety   . Aortic stenosis   . Benign neoplasm of colon 04/19/2001   Hyperplastic  . Bicuspid aortic valve   . Cardiomyopathy secondary   . CHF (congestive heart failure) ([place])   . Coronary artery disease   . Depression   . Falls frequently   . Headache   . Heart murmur   . HTN (hypertension)   . Hypercalcemia   . Hyperlipidemia   . Memory changes 06/2018   in the last year /per the brother    Patient Active Problem List   Diagnosis Date Noted  . Malnutrition of moderate degree 04/15/2020  . Hypercalcemia 04/14/2020  . Dehydration, severe 04/13/2020  . Dehydration 04/13/2020  . Acute hypernatremia   . Failure to thrive in adult   . Closed left hip fracture, initial encounter  ([place]) 12/30/2019  . Frequent falls   . Orthostatic hypotension 12/23/2019  . Syncopal episodes 12/22/2019  . Dementia due to Parkinson's disease without behavioral disturbance ([place]) 12/22/2019  . Renal insufficiency 12/22/2019  . Near syncope 09/04/2019  . AKI (acute kidney injury) ([place]) 09/04/2019  . Hypokalemia 09/04/2019  . Depression 09/04/2019  . Dementia ([place]) 09/04/2019  . Thrombocytopenia ([place]) 09/04/2019  . Low back pain 06/25/2019  . S/P AVR (aortic valve replacement) 12/12/2016  . Aortic stenosis 11/16/2016  . Severe aortic stenosis   . Polycythemia 06/06/2016  . Menopausal state 02/16/2016  . Hyperparathyroidism, primary ([place]) 02/16/2016  . Chronic diastolic CHF (congestive heart failure) ([place]) 05/07/2014  . Hx of colonic polyps 06/17/2013  . Ventricular tachycardia ([place]) 02/21/2011  . Automatic implantable cardioverter-defibrillator in situ 06/02/2009  . Hyperlipidemia 05/29/2009  . SMOKER 05/29/2009  . Essential hypertension 05/29/2009  . Aortic valve disorder 05/29/2009  . CARDIOMYOPATHY, SECONDARY 05/29/2009  . CHF 05/29/2009  . BICUSPID AORTIC VALVE 05/29/2009  . LIVER FUNCTION TESTS, ABNORMAL, HX OF 05/29/2009  . CHOLECYSTITIS, HX OF 05/29/2009    Past Surgical History:  Procedure Laterality Date  . AORTIC VALVE REPLACEMENT N/A 11/16/2016   Procedure: AORTIC VALVE REPLACEMENT (AVR);  Surgeon: Gaye Pollack, MD;  Location: [facility];  Service: Open Heart Surgery;  Laterality: N/A;  74mm Perimount Magna Ease Pericardial Bioprosthesis  . BREAST EXCISIONAL BIOPSY Left 1984   benign  . CARDIAC CATHETERIZATION N/A 07/05/2016  Procedure: Right/Left Heart Cath and Coronary Angiography;  Surgeon: Burnell Blanks, MD;  Location: [facility];  Service: Cardiovascular;  Laterality: N/A;  . CHOLECYSTECTOMY    . implantation of a Guidant biventricular ICD    . laparoscopic cholecystectomy with intraoperative cholangiogram  2003  . TEE WITHOUT CARDIOVERSION  N/A 11/16/2016   Procedure: TRANSESOPHAGEAL ECHOCARDIOGRAM (TEE);  Surgeon: Gaye Pollack, MD;  Location: [facility];  Service: Open Heart Surgery;  Laterality: N/A;  . TOTAL HIP ARTHROPLASTY Left 12/31/2019   Procedure: TOTAL HIP ARTHROPLASTY ANTERIOR APPROACH;  Surgeon: Rod Can, MD;  Location: [facility];  Service: Orthopedics;  Laterality: Left;     OB History   No obstetric history on file.     Family History  Problem Relation Age of Onset  . Pancreatic cancer Mother   . Colon cancer Maternal Aunt   . Bone cancer Brother   . Heart attack Brother   . Hypercalcemia Neg Hx     Social History   Tobacco Use  . Smoking status: Former Smoker    Packs/day: 0.25    Years: 40.00    Pack years: 10.00    Types: Cigarettes  . Smokeless tobacco: Never Used  . Tobacco comment: client reports she smokes 1 cigarette per day  Vaping Use  . Vaping Use: Never used  Substance Use Topics  . Alcohol use: Yes    Alcohol/week: 0.0 standard drinks    Comment: occasional  . Drug use: No    Home Medications Prior to Admission medications   Medication Sig Start Date End Date Taking? Authorizing Provider  acetaminophen (TYLENOL) 325 MG tablet Take 2 tablets (650 mg total) by mouth every 6 (six) hours as needed for mild pain, fever or headache. 05/28/20  Yes Domenic Polite, MD  amLODipine (NORVASC) 10 MG tablet Take 1 tablet (10 mg total) by mouth daily. 05/29/20  Yes Domenic Polite, MD  aspirin 81 MG EC tablet Take 325 mg by mouth daily.   Yes [provider]  carbidopa-levodopa (SINEMET IR) 25-100 MG tablet Take 1 tablet by mouth 3 (three) times daily. 01/02/20 03/09/29 Yes Arrien, Jimmy Picket, MD  carvedilol (COREG) 12.5 MG tablet Take 12.5 mg by mouth 2 (two) times a day. 12/16/18  Yes [provider]  divalproex (DEPAKOTE SPRINKLE) 125 MG capsule Take 125 mg by mouth daily.   Yes [provider]  donepezil (ARICEPT) 10 MG tablet Take 1 tablet (10 mg total) by mouth  daily. 01/02/20 03/09/29 Yes Arrien, Jimmy Picket, MD  feeding supplement, ENSURE ENLIVE, (ENSURE ENLIVE) LIQD Take 237 mLs by mouth 3 (three) times daily between meals. 05/28/20  Yes Domenic Polite, MD  guaifenesin (ROBITUSSIN) 100 MG/5ML syrup Take 200 mg by mouth every 6 (six) hours as needed for cough.    Yes [provider]  Multiple Vitamin (MULTIVITAMIN WITH MINERALS) TABS tablet Take 1 tablet by mouth daily.    Yes [provider]  Neomycin-Bacitracin-Polymyxin (TRIPLE ANTIBIOTIC) [phone] OINT Apply 1 application topically daily as needed (for minor skin tears or abrasions, after cleaning with normal saline, then cover with Band-Aid or gauze and tape).   Yes [provider]  Polyethyl Glycol-Propyl Glycol (SYSTANE OP) Place 1 drop into both eyes daily as needed (for dry eyes.).    Yes [provider]  polyethylene glycol (MIRALAX / GLYCOLAX) 17 g packet Take 17 g by mouth daily.   Yes [provider]  Propylene Glycol (SYSTANE BALANCE) 0.6 % SOLN Apply 1  drop to eye daily as needed (dry eyes).   Yes [provider]  sertraline (ZOLOFT) 100 MG tablet Take 1 tablet (100 mg total) by mouth daily. 02/28/19  Yes Cameron Sprang, MD  sodium bicarbonate 650 MG tablet Take 1 tablet (650 mg total) by mouth 2 (two) times daily. 05/28/20  Yes Domenic Polite, MD  rosuvastatin (CRESTOR) 10 MG tablet Take 1 tablet (10 mg total) by mouth daily. 05/01/11   Evans Lance, MD    Allergies    Trospium chloride er  Review of Systems   Review of Systems  Unable to perform ROS: Mental status change  Psychiatric/Behavioral: Positive for confusion.    Physical Exam Updated Vital Signs  ED Triage Vitals  Enc Vitals Group     BP 11/16/20 1315 (!) 124/99     Pulse Rate 11/16/20 1315 89     Resp 11/16/20 1315 (!) 23     Temp --      Temp Source 11/16/20 1315 Rectal     SpO2 11/16/20 1315 90 %     Weight 11/16/20 1318 94 lb 12.8 oz (43 kg)      Height 11/16/20 1318 5' (1.524 m)     Head Circumference --      Peak Flow --      Pain Score --      Pain Loc --      Pain Edu? --      Excl. in [place]? --     Physical Exam Constitutional:      General: She is in acute distress.     Appearance: She is ill-appearing.  HENT:     Head: Normocephalic and atraumatic.     Mouth/Throat:     Mouth: Mucous membranes are dry.     Comments: Tongue is black/may be gastric contents no obvious lacerations Eyes:     Comments: Pupils are minimally reactive bilaterally, right eye is a little bit irritated with drying of the cornea, no gaze preference eyes are midline  Neck:     Comments: No obvious nuchal rigidity Cardiovascular:     Pulses: Normal pulses.     Heart sounds: Normal heart sounds.  Pulmonary:     [place]: No respiratory distress.     Comments: Coarse breath sounds throughout Abdominal:     General: There is no distension.     Palpations: Abdomen is soft.     Tenderness: There is no guarding.  Musculoskeletal:     Cervical back: Normal range of motion.  Skin:    General: Skin is warm.  Neurological:     GCS: GCS eye subscore is 4. GCS verbal subscore is 1. GCS motor subscore is 5.     ED Results / Procedures / Treatments   Labs (all labs ordered are listed, but only abnormal results are displayed) Labs Reviewed  RESP PANEL BY RT-PCR (FLU A&B, COVID) ARPGX2 - Abnormal; Notable for the following components:      Result Value   SARS Coronavirus 2 by RT PCR POSITIVE (*)    All other components within normal limits  COMPREHENSIVE METABOLIC PANEL - Abnormal; Notable for the following components:   Sodium 163 (*)    Potassium 3.2 (*)    Chloride >130 (*)    CO2 19 (*)    Glucose, Bld 127 (*)    BUN 45 (*)    Creatinine, Ser 1.23 (*)    Calcium 10.9 (*)    Total Protein 8.3 (*)  AST 52 (*)    ALT 49 (*)    GFR, Estimated 48 (*)    All other components within normal limits  CBC WITH DIFFERENTIAL/PLATELET - Abnormal;  Notable for the following components:   HCT 46.6 (*)    RDW 20.1 (*)    Platelets 86 (*)    All other components within normal limits  URINALYSIS, ROUTINE W REFLEX MICROSCOPIC - Abnormal; Notable for the following components:   Color, Urine AMBER (*)    APPearance TURBID (*)    Protein, ur 100 (*)    Leukocytes,Ua LARGE (*)    WBC, UA >50 (*)    Bacteria, UA MANY (*)    Squamous Epithelial / LPF >50 (*)    Non Squamous Epithelial 6-10 (*)    All other components within normal limits  CBG MONITORING, ED - Abnormal; Notable for the following components:   Glucose-Capillary 114 (*)    All other components within normal limits  CULTURE, BLOOD (ROUTINE X 2)  CULTURE, BLOOD (ROUTINE X 2)  URINE CULTURE  LACTIC ACID, PLASMA  PROTIME-INR  APTT  TSH  T4, FREE  LACTIC ACID, PLASMA  OSMOLALITY, URINE  SODIUM, URINE, RANDOM  OSMOLALITY    EKG EKG Interpretation  Date/Time:  Tuesday November 16 2020 13:22:26 EST Ventricular Rate:  91 PR Interval:    QRS Duration: 123 QT Interval:  428 QTC Calculation: 527 R Axis:   120 Text Interpretation: Sinus rhythm Consider left atrial enlargement Consider left ventricular hypertrophy Prolonged QT interval Confirmed by Lennice Sites [phone]) on 11/16/2020 1:32:23 PM   Radiology CT Head Wo Contrast  Result Date: 11/16/2020 CLINICAL DATA:  Mental status change, unknown cause. EXAM: CT HEAD WITHOUT CONTRAST TECHNIQUE: Contiguous axial images were obtained from the base of the skull through the vertex without intravenous contrast. COMPARISON:  Head CT 05/02/2020. FINDINGS: Brain: Moderately severe cerebral atrophy. Moderate cerebellar atrophy. Associated prominence of the ventricles and sulci. Redemonstrated chronic lacunar infarct within the left caudate nucleus. Moderate ill-defined hypoattenuation within the cerebral white matter is nonspecific, but compatible with chronic small vessel ischemic disease. There is no acute intracranial hemorrhage.  No demarcated cortical infarct. No extra-axial fluid collection. No evidence of intracranial mass. No midline shift. Vascular: No hyperdense vessel.  Atherosclerotic calcifications. Skull: Normal. Negative for fracture or focal lesion. Sinuses/Orbits: Visualized orbits show no acute finding. No significant paranasal sinus disease. IMPRESSION: No evidence of acute intracranial abnormality. Redemonstrated chronic left basal ganglia lacunar infarct. Moderate cerebral white matter chronic small vessel ischemic disease. Moderately advanced cerebral atrophy with moderate cerebellar atrophy. Electronically Signed   By: Kellie Simmering DO   On: 11/16/2020 14:29   DG Chest Port 1 View  Result Date: 11/16/2020 CLINICAL DATA:  Questionable sepsis, altered mental status EXAM: PORTABLE CHEST 1 VIEW COMPARISON:  05/18/2020 chest radiograph. FINDINGS: Intact sternotomy wires. Cardiac valvular prosthesis in place. Stable configuration of 3 lead left subclavian ICD. Stable cardiomediastinal silhouette with top-normal heart size. No pneumothorax. No pleural effusion. Lungs appear clear, with no acute consolidative airspace disease and no pulmonary edema. IMPRESSION: No active disease. Electronically Signed   By: Ilona Sorrel M.D.   On: 11/16/2020 15:06    Procedures .Critical Care Performed by: Lennice Sites, DO Authorized by: Lennice Sites, DO   Critical care provider statement:    Critical care time (minutes):  45   Critical care was necessary to treat or prevent imminent or life-threatening deterioration of the following conditions:  Sepsis and metabolic crisis  Critical care was time spent personally by me on the following activities:  Blood draw for specimens, development of treatment plan with patient or surrogate, discussions with primary provider, discussions with consultants, evaluation of patient's response to treatment, examination of patient, obtaining history from patient or surrogate, ordering and  performing treatments and interventions, ordering and review of laboratory studies, ordering and review of radiographic studies, pulse oximetry, re-evaluation of patient's condition and review of old charts   I assumed direction of critical care for this patient from another provider in my specialty: no     Care discussed with: admitting provider       Medications Ordered in ED Medications  vancomycin (VANCOREADY) IVPB 1000 mg/200 mL (1,000 mg Intravenous New Bag/Given 11/16/20 1438)  vancomycin (VANCOREADY) IVPB 500 mg/100 mL (has no administration in time range)  ceFEPIme (MAXIPIME) 2 g in sodium chloride 0.9 % 100 mL IVPB (has no administration in time range)  lactated ringers bolus 1,000 mL (0 mLs Intravenous Stopped 11/16/20 1420)  acetaminophen (TYLENOL) suppository 650 mg (650 mg Rectal Given 11/16/20 1347)  ceFEPIme (MAXIPIME) 2 g in sodium chloride 0.9 % 100 mL IVPB (0 g Intravenous Stopped 11/16/20 1433)    ED Course  I have reviewed the triage vital signs and the nursing notes.  Pertinent labs & imaging results that were available during my care of the patient were reviewed by me and considered in my medical decision making (see chart for details).    MDM Rules/Calculators/A&P                          PAMALEE MARCOE is a 65 year old female with history of heart failure, Parkinson's, dementia, possibly seizures who presents the ED with altered mental status. Arrives febrile but otherwise normal vitals. Patient more somnolent this morning at nursing home. Concern for possible aspiration event. Has had some black stuff coming out of her mouth. Gastric occult was negative. She has brown stool on exam. Patient opens eyes spontaneously and seems to respond slightly to pain. She has a resting tremor in her hands and inner jaw but does not appear to have any real focal weakness. Per son she is bedbound and she appears extremely chronically ill and cachectic. Patient is still full code. Right  now she appears to be breathing on her own and seems to be altered likely in the setting of infectious process. Possibly aspiration versus UTI. She could be having subclinical seizures as she does have this questionable seizure history. Talked with Dr. Rory Percy neurology will get a head CT and a stat EEG. We will give her IV fluids and broad-spectrum IV antibiotics and reevaluate.   Patient with normal white count, normal lactic acid.  Patient with sodium of 163.  Head CT unremarkable.  Also positive for urinary tract infection.  Overall suspect altered mental status in the setting of hypernatremia which is likely in the setting of severe euvolemia malnutrition.  Has had already good improvement with her mental status following fluid bolus.  We will start her on maintenance IV fluids.  Has gotten broad-spectrum IV antibiotics.  Have lesser suspicion for sepsis at this time given hemodynamic stability and reassuring white count and lactic acid.  Chest x-ray overall unremarkable.  No obvious aspiration or pneumonia.  Ongoing care discussions with the family members about CODE STATUS.  Suspect that they will eventually leaning towards DNR.  Will admit to medicine for further care.  Patient also tested positive  for Covid.  However supposedly tested positive about 2 weeks ago at her facility.  Will get confirmation from facility.  This x-ray showed no COVID changes.  This chart was dictated using voice recognition software.  Despite best efforts to proofread,  errors can occur which can change the documentation meaning.    Final Clinical Impression(s) / ED Diagnoses Final diagnoses:  Hypernatremia  Urinary tract infection without hematuria, site unspecified  Altered mental status, unspecified altered mental status type  COVID-19    Rx / DC Orders ED Discharge Orders    None      Lennice Sites, DO 11/16/20 [place], [place], DO 11/16/20 1527

## 2020-11-16 NOTE — Sepsis Progress Note (Signed)
ELink tracking Code Sepsis. °

## 2020-11-16 NOTE — ED Notes (Signed)
Patient critical values of sodium 163 and chloride greater than 130 given to dr Ronnald Nian at this time face to face.

## 2020-11-16 NOTE — ED Triage Notes (Signed)
BIB EMS from [facility] for noted altered mental status. LKW was before bed last night. Hot to touch. Opening eyes, not verbal and not following commands.

## 2020-11-16 NOTE — H&P (Signed)
History and Physical    [name]:[id number] DOB: [birth year] DOA: 11/16/2020  PCP: Lujean Amel, MD Consultants:  Palliative care; Lovena Le - cardiology Patient coming from: [facility]; NOK:   Chief Complaint: AMS  HPI: [name] is a 65 y.o. female with medical history significant of Parkinson's dementia; HLD; HTN; AICD placement; CAD; and chronic systolic CHF presenting with AMS.  Her son and her brother were present throughout the evaluation.  The patient is verbal with 1-2 words and is frail and cachectic.  Family reports that she had a hip fracture about a year ago and has been non-ambulatory since.  She has been failing since with increasing difficulties with feeding, speaking.  She was last able to feed herself a month or more ago and was able to have a limited conversation about 2 weeks ago.  Her brother believes that she would not want to live this way and certainly would not desire resuscitation but her son is not ready to make this decision.    ED Course:  Progressive dementia, cachexia.  More confused this AM, ?aspiration event.  Temp 101.5, looks like UTI but no evidence of sepsis.  Na+ 163.  Given IVF and tylenol, appears to be improving.  Considering change in code status.  Neurology recommends EEG but no plan to consult at this time.  COVID + 2 weeks ago.  Review of Systems: Unable to perform  Ambulatory Status:  Non-ambulatory  COVID Vaccine Status:  Unknown  Past Medical History:  Diagnosis Date  . Abnormal liver function tests   . AICD (automatic cardioverter/defibrillator) present    [employer]  . Anxiety   . Aortic stenosis   . Benign neoplasm of colon 04/19/2001   Hyperplastic  . Bicuspid aortic valve   . Cardiomyopathy secondary   . CHF (congestive heart failure) ([place])   . Coronary artery disease   . Depression   . Falls frequently   . Headache   . Heart murmur   . HTN (hypertension)   . Hypercalcemia   . Hyperlipidemia    . Memory changes 06/2018   in the last year /per the brother  . S/P ICD (internal cardiac defibrillator) procedure 2018    Past Surgical History:  Procedure Laterality Date  . AORTIC VALVE REPLACEMENT N/A 11/16/2016   Procedure: AORTIC VALVE REPLACEMENT (AVR);  Surgeon: Gaye Pollack, MD;  Location: [place];  Service: Open Heart Surgery;  Laterality: N/A;  51mm Perimount Magna Ease Pericardial Bioprosthesis  . BREAST EXCISIONAL BIOPSY Left 1984   benign  . CARDIAC CATHETERIZATION N/A 07/05/2016   Procedure: Right/Left Heart Cath and Coronary Angiography;  Surgeon: Burnell Blanks, MD;  Location: [place] [facility];  Service: Cardiovascular;  Laterality: N/A;  . CHOLECYSTECTOMY    . implantation of a Guidant biventricular ICD    . laparoscopic cholecystectomy with intraoperative cholangiogram  2003  . TEE WITHOUT CARDIOVERSION N/A 11/16/2016   Procedure: TRANSESOPHAGEAL ECHOCARDIOGRAM (TEE);  Surgeon: Gaye Pollack, MD;  Location: [place];  Service: Open Heart Surgery;  Laterality: N/A;  . TOTAL HIP ARTHROPLASTY Left 12/31/2019   Procedure: TOTAL HIP ARTHROPLASTY ANTERIOR APPROACH;  Surgeon: Rod Can, MD;  Location: [facility];  Service: Orthopedics;  Laterality: Left;    Social History   Socioeconomic History  . Marital status: Single    Spouse name: Not on file  . Number of children: 1  . Years of education: Not on file  . Highest education level: Not on  file  Occupational History  . Occupation: Geophysicist/field seismologist: UNEMPLOYED  Tobacco Use  . Smoking status: Former Smoker    Packs/day: 0.25    Years: 40.00    Pack years: 10.00    Types: Cigarettes  . Smokeless tobacco: Never Used  . Tobacco comment: client reports she smokes 1 cigarette per day  Vaping Use  . Vaping Use: Never used  Substance and Sexual Activity  . Alcohol use: Yes    Alcohol/week: 0.0 standard drinks    Comment: occasional  . Drug use: No  . Sexual activity: Not on file  Other  Topics Concern  . Not on file  Social History Narrative  . Not on file   Social Determinants of Health   Financial Resource Strain: Not on file  Food Insecurity: No Food Insecurity  . Worried About Charity fundraiser in the Last Year: Never true  . Ran Out of Food in the Last Year: Never true  Transportation Needs: No Transportation Needs  . Lack of Transportation (Medical): No  . Lack of Transportation (Non-Medical): No  Physical Activity: Not on file  Stress: Not on file  Social Connections: Not on file  Intimate Partner Violence: Not on file    Allergies  Allergen Reactions  . Trospium Chloride Er Other (See Comments)    Constipation     Family History  Problem Relation Age of Onset  . Pancreatic cancer Mother   . Colon cancer Maternal Aunt   . Bone cancer Brother   . Heart attack Brother   . Hypercalcemia Neg Hx     Prior to Admission medications   Medication Sig Start Date End Date Taking? Authorizing Provider  acetaminophen (TYLENOL) 325 MG tablet Take 2 tablets (650 mg total) by mouth every 6 (six) hours as needed for mild pain, fever or headache. 05/28/20  Yes Domenic Polite, MD  amLODipine (NORVASC) 10 MG tablet Take 1 tablet (10 mg total) by mouth daily. 05/29/20  Yes Domenic Polite, MD  aspirin 81 MG EC tablet Take 325 mg by mouth daily.   Yes [provider]  carbidopa-levodopa (SINEMET IR) 25-100 MG tablet Take 1 tablet by mouth 3 (three) times daily. 01/02/20 03/09/29 Yes Arrien, Jimmy Picket, MD  carvedilol (COREG) 12.5 MG tablet Take 12.5 mg by mouth 2 (two) times a day. 12/16/18  Yes [provider]  divalproex (DEPAKOTE SPRINKLE) 125 MG capsule Take 125 mg by mouth daily.   Yes [provider]  donepezil (ARICEPT) 10 MG tablet Take 1 tablet (10 mg total) by mouth daily. 01/02/20 03/09/29 Yes Arrien, Jimmy Picket, MD  feeding supplement, ENSURE ENLIVE, (ENSURE ENLIVE) LIQD Take 237 mLs by mouth 3 (three) times daily between meals.  05/28/20  Yes Domenic Polite, MD  guaifenesin (ROBITUSSIN) 100 MG/5ML syrup Take 200 mg by mouth every 6 (six) hours as needed for cough.    Yes [provider]  Multiple Vitamin (MULTIVITAMIN WITH MINERALS) TABS tablet Take 1 tablet by mouth daily.    Yes [provider]  Neomycin-Bacitracin-Polymyxin (TRIPLE ANTIBIOTIC) [phone] OINT Apply 1 application topically daily as needed (for minor skin tears or abrasions, after cleaning with normal saline, then cover with Band-Aid or gauze and tape).   Yes [provider]  Polyethyl Glycol-Propyl Glycol (SYSTANE OP) Place 1 drop into both eyes daily as needed (for dry eyes.).    Yes [provider]  polyethylene glycol (MIRALAX / GLYCOLAX) 17 g packet Take 17 g by  mouth daily.   Yes [provider]  Propylene Glycol (SYSTANE BALANCE) 0.6 % SOLN Apply 1 drop to eye daily as needed (dry eyes).   Yes [provider]  sertraline (ZOLOFT) 100 MG tablet Take 1 tablet (100 mg total) by mouth daily. 02/28/19  Yes Cameron Sprang, MD  sodium bicarbonate 650 MG tablet Take 1 tablet (650 mg total) by mouth 2 (two) times daily. 05/28/20  Yes Domenic Polite, MD  rosuvastatin (CRESTOR) 10 MG tablet Take 1 tablet (10 mg total) by mouth daily. 05/01/11   Evans Lance, MD    Physical Exam: Vitals:   11/16/20 1400 11/16/20 1430 11/16/20 1445 11/16/20 1500  BP: (!) 145/96 (!) 131/92 126/88 (!) 132/96  Pulse: 67 74 72 65  Resp: 20 11 11 19   TempSrc:      SpO2: 100% 98% 100% 100%  Weight:      Height:         . General:  Appears chronically ill, cachectic, advanced disease . Eyes:   EOMI, normal lids, mild B conjunctival injection . ENT:  grossly normal lips, dry mm . Neck:  no LAD, masses or thyromegaly . Cardiovascular:  RRR, no m/r/g. No LE edema.  Marland Kitchen Respiratory:   CTA bilaterally with no wheezes/rales/rhonchi.  Normal respiratory effort. . Abdomen:  soft, NT, ND, NABS . Skin:  no rash or induration seen  on limited exam . Musculoskeletal:   no bony abnormality . Psychiatric:  Flat/somnolent mood and affect, speech limited to single word answers . Neurologic:  Unable to perform    Radiological Exams on Admission: Independently reviewed - see discussion in A/P where applicable  CT Head Wo Contrast  Result Date: 11/16/2020 CLINICAL DATA:  Mental status change, unknown cause. EXAM: CT HEAD WITHOUT CONTRAST TECHNIQUE: Contiguous axial images were obtained from the base of the skull through the vertex without intravenous contrast. COMPARISON:  Head CT 05/02/2020. FINDINGS: Brain: Moderately severe cerebral atrophy. Moderate cerebellar atrophy. Associated prominence of the ventricles and sulci. Redemonstrated chronic lacunar infarct within the left caudate nucleus. Moderate ill-defined hypoattenuation within the cerebral white matter is nonspecific, but compatible with chronic small vessel ischemic disease. There is no acute intracranial hemorrhage. No demarcated cortical infarct. No extra-axial fluid collection. No evidence of intracranial mass. No midline shift. Vascular: No hyperdense vessel.  Atherosclerotic calcifications. Skull: Normal. Negative for fracture or focal lesion. Sinuses/Orbits: Visualized orbits show no acute finding. No significant paranasal sinus disease. IMPRESSION: No evidence of acute intracranial abnormality. Redemonstrated chronic left basal ganglia lacunar infarct. Moderate cerebral white matter chronic small vessel ischemic disease. Moderately advanced cerebral atrophy with moderate cerebellar atrophy. Electronically Signed   By: Kellie Simmering DO   On: 11/16/2020 14:29   DG Chest Port 1 View  Result Date: 11/16/2020 CLINICAL DATA:  Questionable sepsis, altered mental status EXAM: PORTABLE CHEST 1 VIEW COMPARISON:  05/18/2020 chest radiograph. FINDINGS: Intact sternotomy wires. Cardiac valvular prosthesis in place. Stable configuration of 3 lead left subclavian ICD. Stable  cardiomediastinal silhouette with top-normal heart size. No pneumothorax. No pleural effusion. Lungs appear clear, with no acute consolidative airspace disease and no pulmonary edema. IMPRESSION: No active disease. Electronically Signed   By: Ilona Sorrel M.D.   On: 11/16/2020 15:06    EKG: Independently reviewed.  NSR with rate 91; prolonged QTc 527; nonspecific ST changes with no evidence of acute ischemia   Labs on Admission: I have personally reviewed the available labs and imaging studies at the time of the  admission.  Pertinent labs:   Na++ 163; 137 on 8/31 Glucose 127 BUN 45/Creatinine 1.23/GFR 48; baseline creatinine 0.8, GFR >60 Lactate 1.9 WBC 8.9 Platelets 86 INR 1.2 UA: large LE, 100 protein, many bacteria, >50 WBC   Assessment/Plan Active Problems:   * No active hospital problems. *   Acute metabolic encephalopathy -Patient presenting with encephalopathy as evidenced by her obtunded/unresponsiveness -While the patient does have underlying dementia, this is either a change compared to her usual baseline mental status or is a progression to terminal dementia -Evaluation thus far unremarkable other than possible UTI - she does not have SIRS criteria suggestive of sepsis -Will observe for now with IVF hydration and telemetry monitoring -EEG suggested by neurology although fairly low suspicion for seizure as the cause of her AMS -Will observe in progressive care for now  Hypernatremia due to dehydration, with AKI -Currently also with hypotension -Appears to be profoundly dehydrated, which likely is associated with her inability to take PO due to AMS -Marked hypernatremia and AKI -Patient received 1500 mL bolus while in the ER with improvement in mentation -Will treat with judicious initial hydration with D5W at 75 cc/hour -Will monitor sodium q8h to attempt to avoid overcorrection (>53mEq/L/day)  -Hold nephrotoxic agents as much as possible  Advanced Parkinson's  dementia -She appears to have advanced and likely terminal dementia -Given concern for AMS, she does not appear to be able to swallow without aspiration risk at this time -Will keep NPO including meds (Sinemet, Depakote, Aricept, Zoloft) and request ST consultation -Also with palliative care consultation  Malnutrition -Body mass index is 18.51 kg/m..  -The patient has at least 2 indicators for malnutrition (insufficient energy intake, weight loss, loss of muscle mass, loss of subcutaneous fat diminished functional status).  -This is likely due to starvation related/chronic disease -Will obtain a nutrition consult for further recommendations.  Recent COVID infection -She reportedly had a positive COVID test at her facility on 1/18 -As such, she should be out of the window and should not require precautions -Unfortunately, her COVID test was positive today  -We have been unable to obtain a copy of her positive test from [place] yet -She is listed as a PUI currently but does not appear to be appropriate for a COVID floor at this time  HTN -Holding Norvasc, Coreg due to aspiration risk as well as hypotension -Will add prn hydralazine IV in case her BP spikes  HLD -Hold Crestor while NPO  CAD/chronic systolic CHF with AICD -Hold ASA given concern for aspiration risk -Most recent echo in 08/2019 with EF 55-60% and grade 1 diastolic dysfunction -She may be at greater risk for volume overload in this circumstance and needs to be closely monitored  Thrombocytopenia -Hold AC and place SCDs -Will follow  Goals of care -The majority of this encounter was spent discussing code status and goals of care -While the uncle believes that the patient would not want to live like this and does not have a quality of life, her son is not ready to transition to DNR - much less comfort measures -I have tried to be clear that if she does not respond to IVF and antibiotics it will be most appropriate  to transition to comfort care -Will order palliative care consult     Level of care: Progressive DVT prophylaxis: SCDs Code Status:  Full - confirmed with family Family Communication: Son and brother were present throughout evaluation Disposition Plan:  The patient is from: SNF  Anticipated  d/c is to: SNF  Anticipated d/c date will depend on clinical response to treatment, but possibly as early as tomorrow if she has excellent response to treatment  Patient is currently: acutely ill Consults called: Palliative care; Nutrition; Neurology by telephone only Admission status:  It is my clinical opinion that referral for OBSERVATION is reasonable and necessary in this patient based on the above information provided. The aforementioned taken together are felt to place the patient at high risk for further clinical deterioration. However it is anticipated that the patient may be medically stable for discharge from the hospital within 24 to 48 hours.    Karmen Bongo MD [facility]   How to contact the [facility] Attending or Consulting provider [place] or covering provider during after hours [place], for this patient?  1. Check the care team in [facility] and look for a) attending/consulting [facility] provider listed and b) the [facility] team listed 2. Log into [email or web address] and use [place]'s universal password to access. If you do not have the password, please contact the hospital operator. 3. Locate the [facility] provider you are looking for under [facility] and page to a number that you can be directly reached. 4. If you still have difficulty reaching the provider, please page the [facility] (Director on Call) for the Hospitalists listed on amion for assistance.   11/16/2020, 3:16 PM

## 2020-11-16 NOTE — Progress Notes (Addendum)
Pharmacy Antibiotic Note  [name] is a 65 y.o. female admitted on 11/16/2020 with sepsis.  Patient presents from SNF with noted altered mental status and patient is warm to the touch.  Possible aspiration event at nursing facility vs UTI.  Pharmacy has been consulted for cefepime and vancomycin dosing.  Scr slightly elevated at 1.23, baseline appears to be ~0.8. Monitor renal function for dose adjustments.  Plan:  Vancomycin 1000mg  IV x1, followed by Vancomycin 500mg  IV q24 hours (goal AUC 400-550, pred 517 w/ Scr 1.23) Cefepime 2g IV q12hours F/u cultures, renal function, vanc levels as indicated  PM UPDATE: indication for antibiotics now UTI >> will  -Adjust cefepime to 2g IV q24hours -D/c vancomycin  Height: 5' (152.4 cm) Weight: 43 kg (94 lb 12.8 oz) IBW/kg (Calculated) : 45.5  No data recorded.  No results for input(s): WBC, CREATININE, LATICACIDVEN, VANCOTROUGH, VANCOPEAK, VANCORANDOM, GENTTROUGH, GENTPEAK, GENTRANDOM, TOBRATROUGH, TOBRAPEAK, TOBRARND, AMIKACINPEAK, AMIKACINTROU, AMIKACIN in the last 168 hours.  CrCl cannot be calculated (Patient's most recent lab result is older than the maximum 21 days allowed.).    Allergies  Allergen Reactions  . Trospium Chloride Er Other (See Comments)    Constipation     Antimicrobials this admission: Vancomycin 2/22 >> 2/22 Cefepime 2/22 >>   Microbiology results: 2/22 BCx: ordered 2/22 UCx: ordered   Thank you for allowing pharmacy to be a part of this patient's care.  Dimple Nanas, PharmD PGY-1 Acute Care Pharmacy Resident Office: [phone] 11/16/2020 1:36 PM

## 2020-11-16 NOTE — ED Notes (Signed)
Paged attending for RN 

## 2020-11-16 NOTE — ED Notes (Signed)
[place] called @ 1515-per Dr. Ronnald Nian called by Levada Dy.  F/U call-Ronda, RN-Left Message @ [phone]- fax over copy of Date and results of COVID positive test results.

## 2020-11-16 NOTE — Procedures (Signed)
Patient Name: [name]  MRN: [id number]  Epilepsy Attending: Lora Havens  Referring Physician/Provider: Dr Lennice Sites Date: 11/16/2020 Duration: 25.15 mins  Patient history: 65 year old female with history of heart failure, Parkinson's, dementia, possibly seizures who presents the ED with altered mental status. EEG to evaluate for seizure  Level of alertness: Awake  AEDs during EEG study: None  Technical aspects: This EEG study was done with scalp electrodes positioned according to the 10-20 International system of electrode placement. Electrical activity was acquired at a sampling rate of 500Hz  and reviewed with a high frequency filter of 70Hz  and a low frequency filter of 1Hz . EEG data were recorded continuously and digitally stored.   Description: The posterior dominant rhythm consists of 7.5 Hz activity of moderate voltage (25-35 uV) seen predominantly in posterior head regions, symmetric and reactive to eye opening and eye closing. EEG showed continuous generalized 5 to 7 Hz theta as well as intermittent 2-3hz  delta slowing. Per tech annotation patient was noted to have left arm twitching which was not visible on camera. Concomitant eeg before, during and after the event didn't show eeg change to suggest seizure.  Hyperventilation and photic stimulation were not performed.     ABNORMALITY -Continuous slow, generalized  IMPRESSION: This study is suggestive of mild diffuse encephalopathy, nonspecific etiology. No seizures or epileptiform discharges were seen throughout the recording.  Per tech annotation patient was noted to have left arm twitching which was not visible on camera without concomitant eeg change and was most likely not epileptic  [name] [name]

## 2020-11-16 NOTE — ED Notes (Signed)
Provider paged at this time for critical lab

## 2020-11-16 NOTE — Progress Notes (Signed)
EEG complete - results pending 

## 2020-11-17 DIAGNOSIS — U071 COVID-19: Secondary | ICD-10-CM | POA: Diagnosis not present

## 2020-11-17 DIAGNOSIS — E43 Unspecified severe protein-calorie malnutrition: Secondary | ICD-10-CM | POA: Diagnosis present

## 2020-11-17 DIAGNOSIS — R627 Adult failure to thrive: Secondary | ICD-10-CM | POA: Diagnosis present

## 2020-11-17 DIAGNOSIS — G9341 Metabolic encephalopathy: Secondary | ICD-10-CM | POA: Diagnosis present

## 2020-11-17 DIAGNOSIS — D696 Thrombocytopenia, unspecified: Secondary | ICD-10-CM | POA: Diagnosis present

## 2020-11-17 DIAGNOSIS — F0280 Dementia in other diseases classified elsewhere without behavioral disturbance: Secondary | ICD-10-CM | POA: Diagnosis present

## 2020-11-17 DIAGNOSIS — R1310 Dysphagia, unspecified: Secondary | ICD-10-CM | POA: Diagnosis present

## 2020-11-17 DIAGNOSIS — Z95810 Presence of automatic (implantable) cardiac defibrillator: Secondary | ICD-10-CM | POA: Diagnosis not present

## 2020-11-17 DIAGNOSIS — G20 Parkinson's disease: Secondary | ICD-10-CM | POA: Diagnosis present

## 2020-11-17 DIAGNOSIS — I5032 Chronic diastolic (congestive) heart failure: Secondary | ICD-10-CM | POA: Diagnosis not present

## 2020-11-17 DIAGNOSIS — E785 Hyperlipidemia, unspecified: Secondary | ICD-10-CM | POA: Diagnosis present

## 2020-11-17 DIAGNOSIS — R4182 Altered mental status, unspecified: Secondary | ICD-10-CM | POA: Diagnosis not present

## 2020-11-17 DIAGNOSIS — I5022 Chronic systolic (congestive) heart failure: Secondary | ICD-10-CM | POA: Diagnosis present

## 2020-11-17 DIAGNOSIS — I2510 Atherosclerotic heart disease of native coronary artery without angina pectoris: Secondary | ICD-10-CM | POA: Diagnosis present

## 2020-11-17 DIAGNOSIS — E860 Dehydration: Secondary | ICD-10-CM | POA: Diagnosis present

## 2020-11-17 DIAGNOSIS — N179 Acute kidney failure, unspecified: Secondary | ICD-10-CM | POA: Diagnosis present

## 2020-11-17 DIAGNOSIS — E870 Hyperosmolality and hypernatremia: Secondary | ICD-10-CM | POA: Diagnosis present

## 2020-11-17 DIAGNOSIS — I110 Hypertensive heart disease with heart failure: Secondary | ICD-10-CM | POA: Diagnosis present

## 2020-11-17 DIAGNOSIS — D649 Anemia, unspecified: Secondary | ICD-10-CM | POA: Diagnosis present

## 2020-11-17 DIAGNOSIS — R64 Cachexia: Secondary | ICD-10-CM | POA: Diagnosis present

## 2020-11-17 DIAGNOSIS — R296 Repeated falls: Secondary | ICD-10-CM | POA: Diagnosis present

## 2020-11-17 DIAGNOSIS — J9601 Acute respiratory failure with hypoxia: Secondary | ICD-10-CM | POA: Diagnosis not present

## 2020-11-17 DIAGNOSIS — N390 Urinary tract infection, site not specified: Secondary | ICD-10-CM | POA: Diagnosis present

## 2020-11-17 DIAGNOSIS — J690 Pneumonitis due to inhalation of food and vomit: Secondary | ICD-10-CM | POA: Diagnosis not present

## 2020-11-17 DIAGNOSIS — Z515 Encounter for palliative care: Secondary | ICD-10-CM | POA: Diagnosis not present

## 2020-11-17 DIAGNOSIS — Z681 Body mass index (BMI) 19 or less, adult: Secondary | ICD-10-CM | POA: Diagnosis not present

## 2020-11-17 DIAGNOSIS — Z8616 Personal history of COVID-19: Secondary | ICD-10-CM | POA: Diagnosis not present

## 2020-11-17 DIAGNOSIS — Z66 Do not resuscitate: Secondary | ICD-10-CM | POA: Diagnosis not present

## 2020-11-17 LAB — BASIC METABOLIC PANEL
Anion gap: 12 (ref 5–15)
Anion gap: 11 (ref 5–15)
Anion gap: 11 (ref 5–15)
BUN: 41 mg/dL — ABNORMAL HIGH (ref 8–23)
BUN: 35 mg/dL — ABNORMAL HIGH (ref 8–23)
BUN: 34 mg/dL — ABNORMAL HIGH (ref 8–23)
CO2: 21 mmol/L — ABNORMAL LOW (ref 22–32)
CO2: 25 mmol/L (ref 22–32)
CO2: 20 mmol/L — ABNORMAL LOW (ref 22–32)
Calcium: 10.3 mg/dL (ref 8.9–10.3)
Calcium: 10.4 mg/dL — ABNORMAL HIGH (ref 8.9–10.3)
Calcium: 9.7 mg/dL (ref 8.9–10.3)
Chloride: 128 mmol/L — ABNORMAL HIGH (ref 98–111)
Chloride: 124 mmol/L — ABNORMAL HIGH (ref 98–111)
Chloride: 122 mmol/L — ABNORMAL HIGH (ref 98–111)
Creatinine, Ser: 0.74 mg/dL (ref 0.44–1.00)
Creatinine, Ser: 0.79 mg/dL (ref 0.44–1.00)
Creatinine, Ser: 0.57 mg/dL (ref 0.44–1.00)
GFR, Estimated: >60 mL/min (ref >60)
GFR, Estimated: >60 mL/min (ref >60)
GFR, Estimated: >60 mL/min (ref >60)
Glucose, Bld: 110 mg/dL — ABNORMAL HIGH (ref 70–99)
Glucose, Bld: 143 mg/dL — ABNORMAL HIGH (ref 70–99)
Glucose, Bld: 159 mg/dL — ABNORMAL HIGH (ref 70–99)
Potassium: 4.3 mmol/L (ref 3.5–5.1)
Potassium: 3.2 mmol/L — ABNORMAL LOW (ref 3.5–5.1)
Potassium: 2.9 mmol/L — ABNORMAL LOW (ref 3.5–5.1)
Sodium: 161 mmol/L (ref 135–145)
Sodium: 160 mmol/L — ABNORMAL HIGH (ref 135–145)
Sodium: 153 mmol/L — ABNORMAL HIGH (ref 135–145)

## 2020-11-17 LAB — CBC
HCT: 42.2 % (ref 36.0–46.0)
Hemoglobin: 13.6 g/dL (ref 12.0–15.0)
MCH: 29.6 pg (ref 26.0–34.0)
MCHC: 32.2 g/dL (ref 30.0–36.0)
MCV: 91.9 fL (ref 80.0–100.0)
Platelets: 65 10*3/uL — ABNORMAL LOW (ref 150–400)
RBC: 4.59 MIL/uL (ref 3.87–5.11)
RDW: 20.4 % — ABNORMAL HIGH (ref 11.5–15.5)
WBC: 11 10*3/uL — ABNORMAL HIGH (ref 4.0–10.5)
nRBC: 0 % (ref 0.0–0.2)

## 2020-11-17 LAB — FOLATE: Folate: 39.1 ng/mL (ref >5.9)

## 2020-11-17 LAB — AMMONIA: Ammonia: 17 umol/L (ref 9–35)

## 2020-11-17 LAB — VITAMIN B12: Vitamin B-12: 1328 pg/mL — ABNORMAL HIGH (ref 180–914)

## 2020-11-17 MED ORDER — CHLORHEXIDINE GLUCONATE CLOTH 2 % EX PADS
6.0000 | MEDICATED_PAD | Freq: Every day | CUTANEOUS | Status: DC
  Administered 2020-11-17 – 2020-11-18 (×2): 6 via TOPICAL

## 2020-11-17 NOTE — Evaluation (Addendum)
Clinical/Bedside Swallow Evaluation Patient Details  Name: [name] MRN: [id number] Date of Birth: Sep 20, 1956  Today's Date: 11/17/2020 Time: SLP Start Time (ACUTE ONLY): 1109 SLP Stop Time (ACUTE ONLY): 1119 SLP Time Calculation (min) (ACUTE ONLY): 10 min  Past Medical History:  Past Medical History:  Diagnosis Date  . Abnormal liver function tests   . AICD (automatic cardioverter/defibrillator) present    [employer]  . Anxiety   . Aortic stenosis   . Benign neoplasm of colon 04/19/2001   Hyperplastic  . Bicuspid aortic valve   . Cardiomyopathy secondary   . CHF (congestive heart failure) ([place])   . Coronary artery disease   . Depression   . Falls frequently   . Headache   . Heart murmur   . HTN (hypertension)   . Hypercalcemia   . Hyperlipidemia   . Memory changes 06/2018   in the last year /per the brother   Past Surgical History:  Past Surgical History:  Procedure Laterality Date  . AORTIC VALVE REPLACEMENT N/A 11/16/2016   Procedure: AORTIC VALVE REPLACEMENT (AVR);  Surgeon: Gaye Pollack, MD;  Location: [facility];  Service: Open Heart Surgery;  Laterality: N/A;  20mm Perimount Magna Ease Pericardial Bioprosthesis  . BREAST EXCISIONAL BIOPSY Left 1984   benign  . CARDIAC CATHETERIZATION N/A 07/05/2016   Procedure: Right/Left Heart Cath and Coronary Angiography;  Surgeon: Burnell Blanks, MD;  Location: [facility];  Service: Cardiovascular;  Laterality: N/A;  . CHOLECYSTECTOMY    . implantation of a Guidant biventricular ICD    . laparoscopic cholecystectomy with intraoperative cholangiogram  2003  . TEE WITHOUT CARDIOVERSION N/A 11/16/2016   Procedure: TRANSESOPHAGEAL ECHOCARDIOGRAM (TEE);  Surgeon: Gaye Pollack, MD;  Location: [facility];  Service: Open Heart Surgery;  Laterality: N/A;  . TOTAL HIP ARTHROPLASTY Left 12/31/2019   Procedure: TOTAL HIP ARTHROPLASTY ANTERIOR APPROACH;  Surgeon: Rod Can, MD;  Location: [facility];  Service:  Orthopedics;  Laterality: Left;   HPI:  65 y.o. female with medical history significant for Parkinson's dementia; HLD; HTN; AICD placement; CAD; and chronic systolic CHF presenting with AMS. Per H&P, the patient is verbal with 1-2 words. She is frail and cachectic.  Family reports that she had a hip fracture about a year ago and has been non-ambulatory since.  She has been failing since with increasing difficulties with feeding, speaking.  She was last able to feed herself a month or more ago and was able to have a limited conversation about 2 weeks ago. (SLP performed a clinical swallow evaluation 04/14/20 - dysphagia 3/thin liquids recommended.) Dx acute metabolic encephalopathy, malnutrition, hypernatremia, dehydration, AKI. Concerns for aspiration due to progressive dementia.   Assessment / Plan / Recommendation Clinical Impression  Pt answered simple questions and participated in swallow assessment. She kept her eyes closed for duration of session.  She presents with dysphagia marked by oral holding and delays, multiple subswallows per bolus (inconsistent), and coughing after multiple boluses of water from a straw. She tended to hold water in oral cavity before releasing and swallowing, leading to coughing. When provided with controlled sips from the edge of a cup or from a teaspoon, swallow appeared to occur more rapidly and no coughing ensured.  Recommend starting a dysphagia 1 diet for now; thin liquids via teaspoon/edge of cup (no straws); crush meds.   SLP will follow for safety, family education, and to determine if there would be value in pursuing an instrumental swallow study. Given  mental status and recent decline, it is likely that pt is intermittently aspiration.  Hold POs if coughing while eating.   SLP Visit Diagnosis: Dysphagia, oropharyngeal phase (R13.12)    Aspiration Risk  Mild aspiration risk    Diet Recommendation     Medication Administration: Crushed with puree    Other   Recommendations Oral Care Recommendations: Oral care BID   Follow up Recommendations Skilled Nursing facility      Frequency and Duration min 2x/week  2 weeks       Prognosis Prognosis for Safe Diet Advancement: Guarded Barriers to Reach Goals: Cognitive deficits      Swallow Study   General HPI: 65 y.o. female with medical history significant for Parkinson's dementia; HLD; HTN; AICD placement; CAD; and chronic systolic CHF presenting with AMS. Per H&P, the patient is verbal with 1-2 words. She is frail and cachectic.  Family reports that she had a hip fracture about a year ago and has been non-ambulatory since.  She has been failing since with increasing difficulties with feeding, speaking.  She was last able to feed herself a month or more ago and was able to have a limited conversation about 2 weeks ago. (SLP performed a clinical swallow evaluation 04/14/20 - dysphagia 3/thin liquids recommended.) Dx acute metabolic encephalopathy, malnutrition, hypernatremia, dehydration, AKI. Concerns for aspiration due to progressive dementia. Type of Study: Bedside Swallow Evaluation Previous Swallow Assessment: see HPI Diet Prior to this Study: NPO Temperature Spikes Noted: No Respiratory Status: Room air History of Recent Intubation: No Behavior/Cognition: Alert Oral Cavity Assessment: Within Functional Limits Oral Care Completed by SLP: No Vision: Impaired for self-feeding Self-Feeding Abilities: Total assist Patient Positioning: Upright in bed Baseline Vocal Quality: Normal Volitional Cough: Cognitively unable to elicit Volitional Swallow: Able to elicit    Oral/Motor/Sensory Function     Ice Chips Ice chips: Within functional limits   Thin Liquid Thin Liquid: Impaired Presentation: Straw;Cup;Spoon Oral Phase Functional Implications: Oral holding Pharyngeal  Phase Impairments: Multiple swallows;Cough - Immediate    Nectar Thick Nectar Thick Liquid: Not tested   Honey Thick Honey  Thick Liquid: Not tested   Puree Puree: Within functional limits   Solid     Solid: Not tested      Juan Quam Laurice 11/17/2020,11:45 AM  Estill Bamberg L. Tivis Ringer, [place] Office number [phone] Pager [phone]

## 2020-11-17 NOTE — ED Notes (Signed)
Hospitalist made aware of critical lab.

## 2020-11-17 NOTE — Evaluation (Signed)
Speech Language Pathology Evaluation Patient Details Name: [name] MRN: [id number] DOB: Jan 08, 1956 Today's Date: 11/17/2020 Time: 0623-7628 SLP Time Calculation (min) (ACUTE ONLY): 10 min  Problem List:  Patient Active Problem List   Diagnosis Date Noted  . Hypernatremia 11/17/2020  . Acute metabolic encephalopathy 31/51/7616  . Malnutrition of moderate degree 04/15/2020  . Hypercalcemia 04/14/2020  . Dehydration, severe 04/13/2020  . Dehydration 04/13/2020  . Acute hypernatremia   . Failure to thrive in adult   . Closed left hip fracture, initial encounter ([place]) 12/30/2019  . Frequent falls   . Orthostatic hypotension 12/23/2019  . Syncopal episodes 12/22/2019  . Dementia due to Parkinson's disease without behavioral disturbance ([place]) 12/22/2019  . Renal insufficiency 12/22/2019  . Near syncope 09/04/2019  . AKI (acute kidney injury) ([place]) 09/04/2019  . Hypokalemia 09/04/2019  . Depression 09/04/2019  . Dementia ([place]) 09/04/2019  . Thrombocytopenia ([place]) 09/04/2019  . Low back pain 06/25/2019  . S/P AVR (aortic valve replacement) 12/12/2016  . Aortic stenosis 11/16/2016  . Severe aortic stenosis   . Polycythemia 06/06/2016  . Menopausal state 02/16/2016  . Hyperparathyroidism, primary ([place]) 02/16/2016  . Chronic diastolic CHF (congestive heart failure) ([place]) 05/07/2014  . Hx of colonic polyps 06/17/2013  . Ventricular tachycardia ([place]) 02/21/2011  . Automatic implantable cardioverter-defibrillator in situ 06/02/2009  . Hyperlipidemia 05/29/2009  . SMOKER 05/29/2009  . Essential hypertension 05/29/2009  . Aortic valve disorder 05/29/2009  . CARDIOMYOPATHY, SECONDARY 05/29/2009  . CHF 05/29/2009  . BICUSPID AORTIC VALVE 05/29/2009  . LIVER FUNCTION TESTS, ABNORMAL, HX OF 05/29/2009  . CHOLECYSTITIS, HX OF 05/29/2009   Past Medical History:  Past Medical History:  Diagnosis Date  . Abnormal liver function tests   . AICD (automatic cardioverter/defibrillator)  present    [employer]  . Anxiety   . Aortic stenosis   . Benign neoplasm of colon 04/19/2001   Hyperplastic  . Bicuspid aortic valve   . Cardiomyopathy secondary   . CHF (congestive heart failure) ([place])   . Coronary artery disease   . Depression   . Falls frequently   . Headache   . Heart murmur   . HTN (hypertension)   . Hypercalcemia   . Hyperlipidemia   . Memory changes 06/2018   in the last year /per the brother   Past Surgical History:  Past Surgical History:  Procedure Laterality Date  . AORTIC VALVE REPLACEMENT N/A 11/16/2016   Procedure: AORTIC VALVE REPLACEMENT (AVR);  Surgeon: Gaye Pollack, MD;  Location: [facility];  Service: Open Heart Surgery;  Laterality: N/A;  71mm Perimount Magna Ease Pericardial Bioprosthesis  . BREAST EXCISIONAL BIOPSY Left 1984   benign  . CARDIAC CATHETERIZATION N/A 07/05/2016   Procedure: Right/Left Heart Cath and Coronary Angiography;  Surgeon: Burnell Blanks, MD;  Location: [facility];  Service: Cardiovascular;  Laterality: N/A;  . CHOLECYSTECTOMY    . implantation of a Guidant biventricular ICD    . laparoscopic cholecystectomy with intraoperative cholangiogram  2003  . TEE WITHOUT CARDIOVERSION N/A 11/16/2016   Procedure: TRANSESOPHAGEAL ECHOCARDIOGRAM (TEE);  Surgeon: Gaye Pollack, MD;  Location: [facility];  Service: Open Heart Surgery;  Laterality: N/A;  . TOTAL HIP ARTHROPLASTY Left 12/31/2019   Procedure: TOTAL HIP ARTHROPLASTY ANTERIOR APPROACH;  Surgeon: Rod Can, MD;  [facility];  Service: Orthopedics;  Laterality: Left;   HPI:  65 y.o. female with medical history significant for Parkinson's dementia; HLD; HTN; AICD placement; CAD; and chronic systolic CHF  presenting with AMS. Per H&P, the patient is verbal with 1-2 words. She is frail and cachectic.  Family reports that she had a hip fracture about a year ago and has been non-ambulatory since.  She has been failing since with increasing  difficulties with feeding, speaking.  She was last able to feed herself a month or more ago and was able to have a limited conversation about 2 weeks ago. (SLP performed a clinical swallow evaluation 04/14/20 - dysphagia 3/thin liquids recommended.) Dx acute metabolic encephalopathy, malnutrition, hypernatremia, dehydration, AKI. Concerns for aspiration due to progressive dementia.   Assessment / Plan / Recommendation Clinical Impression  Pt participated in limited speech/language cognitive assessment.  Presented with decreased initiation and spontaneity of speech; delayed response time. Kept eyes closed; followed occasional simple commands and answered questions related to personal needs and biographical information re: personal history. Speech was clear without dysarthria; broad deficits in attention/recall apparent.  Baseline cognition unknown beyond chart reporting Parkinson's related dementia with recent decline.  Given SNF-level support, there are no acute care SLP needs identified with regard to cognition.  SLP will follow for swallowing needs only.    SLP Assessment  SLP Recommendation/Assessment: Patient does not need any further Speech [place] [facility] SLP Visit Diagnosis: Cognitive communication deficit (R41.841)    Follow Up Recommendations  Skilled Nursing facility    Frequency and Duration min 2x/week         SLP Evaluation Cognition  Overall Cognitive Status: No family/caregiver present to determine baseline cognitive functioning Arousal/Alertness: Awake/alert Orientation Level: Oriented to person;Disoriented to place Attention: Sustained Sustained Attention: Impaired Sustained Attention Impairment: Verbal basic Memory: Impaired Memory Impairment: Storage deficit Awareness: Impaired       Comprehension  Auditory Comprehension Overall Auditory Comprehension: Impaired Yes/No Questions: Impaired Complex Questions: 25-49% accurate Commands: Impaired One Step  Basic Commands: 25-49% accurate Reading Comprehension Reading Status: Not tested    Expression Expression Primary Mode of Expression: Verbal Verbal Expression Overall Verbal Expression: Appears within functional limits for tasks assessed Initiation: Impaired Automatic Speech: Social Response Level of Generative/Spontaneous Verbalization: Phrase   Oral / Motor  Oral Motor/Sensory Function Overall Oral Motor/Sensory Function: Within functional limits Motor Speech Overall Motor Speech: Appears within functional limits for tasks assessed Articulation: Within functional limitis Intelligibility: Intelligible   GO                    Juan Quam Laurice 11/17/2020, 12:06 PM  Jajuan Skoog L. Tivis Ringer, [place] Office number [phone] Pager [phone]

## 2020-11-17 NOTE — ED Notes (Signed)
Attempted report x1. 

## 2020-11-17 NOTE — ED Notes (Signed)
This RN received a [facility] concerning pt's covid results. Pt tested positive on 10/08/2020 and successfully completed her full run of quarantine period with no complications. Pt was reswabbed here but still in the 90 day window and tested positive. Pt currently has no symptoms. This was relayed to Dr. Florene Glen. Per Dr. Florene Glen pt can come off of precautions. This RN will continue to monitor.

## 2020-11-17 NOTE — Progress Notes (Signed)
PROGRESS NOTE    [name]  ZJI:[id number] DOB: 1956/02/11 DOA: 11/16/2020 PCP: Lujean Amel, MD   Chief Complaint  Patient presents with  . Altered Mental Status    Brief Narrative:  [name] is [name] 65 y.o. female with medical history significant of Parkinson's dementia; HLD; HTN; AICD placement; CAD; and chronic systolic CHF presenting with AMS.  Her son and her brother were present throughout the evaluation.  The patient is verbal with 1-2 words and is frail and cachectic.  Family reports that she had Halee Glynn hip fracture about Franciso Dierks year ago and has been non-ambulatory since.  She has been failing since with increasing difficulties with feeding, speaking.  She was last able to feed herself Annayah Worthley month or more ago and was able to have Awanda Wilcock limited conversation about 2 weeks ago.  Her brother believes that she would not want to live this way and certainly would not desire resuscitation but her son is not ready to make this decision.  ED Course:  Progressive dementia, cachexia.  More confused this AM, ?aspiration event.  Temp 101.5, looks like UTI but no evidence of sepsis.  Na+ 163.  Given IVF and tylenol, appears to be improving.  Considering change in code status.  Neurology recommends EEG but no plan to consult at this time.  COVID + 2 weeks ago.  Assessment & Plan:   Principal Problem:   Acute metabolic encephalopathy Active Problems:   Hyperlipidemia   Essential hypertension   Automatic implantable cardioverter-defibrillator in situ   Chronic diastolic CHF (congestive heart failure) (HCC)   AKI (acute kidney injury) ([place])   Dementia due to Parkinson's disease without behavioral disturbance ([place])  Acute metabolic encephalopathy -Patient presenting with encephalopathy as evidenced by her obtunded/unresponsiveness -likely 2/2 UTI in setting of her dementia  -Will observe for now with IVF and abx -EEG with mild diffuse encephalopathy, nonspecific -head CT without acute abnormality   -consider additional imaging if persistent -TSH wnl.  B12 and folate pending. Ammonia pending.   Pyuria  Concern for UTI  SIRS Follow blood and urine cultures Continue cefepime  CXR without acute disease Tachypnea with leukocytosis, concern for UTI  Hypernatremia due to dehydration, with AKI  Hypotension -Appears to be profoundly dehydrated, which likely is associated with her inability to take PO due to AMS as well as possible UTI -AKI improved, hypotension resolved -persistent hypernatremia, continue hypotonic IVF (2.9 L free water deficit) and adjust as needed -Will monitor sodium q8h to attempt to avoid overcorrection (>21mEq/L/day)  -Hold nephrotoxic agents as much as possible  Advanced Parkinson's dementia -She appears to have advanced dementia -Given concern for AMS, she does not appear to be able to swallow without aspiration risk at this time -Will keep NPO including meds (Sinemet, Depakote, Aricept, Zoloft) and request ST consultation -Also with palliative care consultation  Malnutrition -Body mass index is 18.51 kg/m..  -The patient has at least 2 indicators for malnutrition (insufficient energy intake, weight loss, loss of muscle mass, loss of subcutaneous fat diminished functional status).  -This is likely due to starvation related/chronic disease -Will obtain Butler Vegh nutrition consult for further recommendations.  Recent COVID infection -She reportedly had Anselm Aumiller positive COVID test at her facility on 1/18 - will work on confirming this.  Called [facility] today, left message.    -As such, she should be out of the window and should not require precautions, but will confirm her test results first prior to discontinuing precautions.   HTN -Holding Norvasc,  Coreg due to aspiration risk as well as hypotension  HLD -Hold Crestor while NPO  CAD/chronic systolic CHF with AICD -Hold ASA given concern for aspiration risk -Most recent echo in 08/2019 with EF 55-60% and  grade 1 diastolic dysfunction -She may be at greater risk for volume overload in this circumstance and needs to be closely monitored  Thrombocytopenia -Hold AC and place SCDs -likely 2/2 acute infection (UTI) -Will follow smear  Concern for afib She's on telemetry  EKG this AM with concern for atrial fib (I think this is more likely related to fine tremor that she has).  Will continue to monitor on tele, consider repeat ekg and/or discussion with cardiology.  She's not appropriate for anticoagulation at this time with thrombocytopenia.  Goals of care - Per previous provider "While the uncle believes that the patient would not want to live like this and does not have Jeanine Caven quality of life, her son is not ready to transition to DNR - much less comfort measures" -Will order palliative care consult  DVT prophylaxis: SCD's with thrombocytopenia Code Status: full  Family Communication: none at bedside Disposition:   Status is: Observation  The patient will require care spanning > 2 midnights and should be moved to inpatient because: Inpatient level of care appropriate due to severity of illness  Dispo: The patient is from: SNF              Anticipated d/c is to: SNF              Anticipated d/c date is: > 3 days              Patient currently is not medically stable to d/c.   Difficult to place patient No       Consultants:  none Procedures:  EEG  IMPRESSION: This study is suggestive of mild diffuse encephalopathy, nonspecific etiology. No seizures or epileptiform discharges were seen throughout the recording.  Per tech annotation patient was noted to have left arm twitching which was not visible on camera without concomitant eeg change and was most likely not epileptic   Antimicrobials:  Anti-infectives (From admission, onward)   Start     Dose/Rate Route Frequency Ordered Stop   11/17/20 1400  vancomycin (VANCOREADY) IVPB 500 mg/100 mL  Status:  Discontinued        500  mg 100 mL/hr over 60 Minutes Intravenous Every 24 hours 11/16/20 1444 11/16/20 1704   11/17/20 1300  ceFEPIme (MAXIPIME) 2 g in sodium chloride 0.9 % 100 mL IVPB        2 g 200 mL/hr over 30 Minutes Intravenous Every 24 hours 11/16/20 1711     11/17/20 0200  ceFEPIme (MAXIPIME) 2 g in sodium chloride 0.9 % 100 mL IVPB  Status:  Discontinued        2 g 200 mL/hr over 30 Minutes Intravenous Every 12 hours 11/16/20 1444 11/16/20 1711   11/16/20 1345  vancomycin (VANCOREADY) IVPB 1000 mg/200 mL        1,000 mg 200 mL/hr over 60 Minutes Intravenous  Once 11/16/20 1330 11/16/20 1615   11/16/20 1345  ceFEPIme (MAXIPIME) 2 g in sodium chloride 0.9 % 100 mL IVPB        2 g 200 mL/hr over 30 Minutes Intravenous  Once 11/16/20 1331 11/16/20 1433         Subjective: Laelia Angelo&Ox1 Answers questions intermittently   Objective: Vitals:   11/17/20 0300 11/17/20 0400 11/17/20 0500 11/17/20 0600  BP: Marland Kitchen)  157/95 (!) 157/96 (!) 157/90 119/84  Pulse: (!) 50 (!) 50 (!) 52 (!) 50  Resp: 14 15 13 16   Temp:      TempSrc:      SpO2: 99% 99% 99% 99%  Weight:      Height:        Intake/Output Summary (Last 24 hours) at 11/17/2020 0859 Last data filed at 11/16/2020 1751 Gross per 24 hour  Intake 1845.99 ml  Output 200 ml  Net 1645.99 ml   Filed Weights   11/16/20 1318  Weight: 43 kg    Examination:  General exam: chronically ill appearing, appears older than stated age Respiratory system: CTAB, no clear adventitious lung sounds Cardiovascular system: S1 & S2 heard, RRR Gastrointestinal system: Abdomen is nondistended, soft and nontender Central nervous system: Alert and oriented x1 (answered correctly location - hospital, but only intermittently answered questions).  Followed commands intermittently.  Doesn't lift upper or lower extremities, squeezes hands to command, but doesn't move lower extremities to command or painful stimuli.  (doesn't move legs per discussion with son, facility) Extremities:  no LEE Skin: right 2nd toe with abrasion  Data Reviewed: I have personally reviewed following labs and imaging studies  CBC: Recent Labs  Lab 11/16/20 1312 11/17/20 0258  WBC 8.9 11.0*  NEUTROABS 5.9  --   HGB 14.2 13.6  HCT 46.6* 42.2  MCV 92.1 91.9  PLT 86* 65*    Basic Metabolic Panel: Recent Labs  Lab 11/16/20 1312 11/16/20 2030 11/17/20 0258  NA 163* 162* 161*  K 3.2* 3.5 4.3  CL >130* 129* 128*  CO2 19* 23 21*  GLUCOSE 127* 117* 110*  BUN 45* 42* 41*  CREATININE 1.23* 0.99 0.74  CALCIUM 10.9* 10.5* 10.3    GFR: Estimated Creatinine Clearance: 47 mL/min (by C-G formula based on SCr of 0.74 mg/dL).  Liver Function Tests: Recent Labs  Lab 11/16/20 1312  AST 52*  ALT 49*  ALKPHOS 63  BILITOT 0.9  PROT 8.3*  ALBUMIN 3.7    CBG: Recent Labs  Lab 11/16/20 1314  GLUCAP 114*     Recent Results (from the past 240 hour(s))  Resp Panel by RT-PCR (Flu Emani Taussig&B, Covid) Nasopharyngeal Swab     Status: Abnormal   Collection Time: 11/16/20  1:12 PM   Specimen: Nasopharyngeal Swab; Nasopharyngeal(NP) swabs in vial transport medium  Result Value Ref Range Status   SARS Coronavirus 2 by RT PCR POSITIVE (Lutricia Widjaja) NEGATIVE Final    Comment: RESULT CALLED TO, READ BACK BY AND VERIFIED WITH: RN M.EINIK AT 1523 ON 11/16/2020 BY T.SAAD (NOTE) SARS-CoV-2 target nucleic acids are DETECTED.  The SARS-CoV-2 RNA is generally detectable in upper respiratory specimens during the acute phase of infection. Positive results are indicative of the presence of the identified virus, but do not rule out bacterial infection or co-infection with other pathogens not detected by the test. Clinical correlation with patient history and other diagnostic information is necessary to determine patient infection status. The expected result is Negative.  Fact Sheet for Patients: EntrepreneurPulse.com.au  Fact Sheet for Healthcare  Providers: IncredibleEmployment.be  This test is not yet approved or cleared by the [place] FDA and  has been authorized for detection and/or diagnosis of SARS-CoV-2 by FDA under an Emergency Use Authorization (EUA).  This EUA will remain in effect (meaning this te st can be used) for the duration of  the COVID-19 declaration under Section 564(b)(1) of the Act, 21 U.S.C. section 360bbb-3(b)(1), unless the authorization is  terminated or revoked sooner.     Influenza Makye Radle by PCR NEGATIVE NEGATIVE Final   Influenza B by PCR NEGATIVE NEGATIVE Final    Comment: (NOTE) The Xpert Xpress SARS-CoV-2/FLU/RSV plus assay is intended as an aid in the diagnosis of influenza from Nasopharyngeal swab specimens and should not be used as Cydnee Fuquay sole basis for treatment. Nasal washings and aspirates are unacceptable for Xpert Xpress SARS-CoV-2/FLU/RSV testing.  Fact Sheet for Patients: EntrepreneurPulse.com.au  Fact Sheet for Healthcare Providers: IncredibleEmployment.be  This test is not yet approved or cleared by the [place] FDA and has been authorized for detection and/or diagnosis of SARS-CoV-2 by FDA under an Emergency Use Authorization (EUA). This EUA will remain in effect (meaning this test can be used) for the duration of the COVID-19 declaration under Section 564(b)(1) of the Act, 21 U.S.C. section 360bbb-3(b)(1), unless the authorization is terminated or revoked.  Performed at [facility], [place] [employer] [address], [place], [place] [address]   Blood Culture (routine x 2)     Status: None (Preliminary result)   Collection Time: 11/16/20  1:12 PM   Specimen: BLOOD RIGHT FOREARM  Result Value Ref Range Status   Specimen Description BLOOD RIGHT FOREARM  Final   Special Requests   Final    BOTTLES DRAWN AEROBIC AND ANAEROBIC Blood Culture adequate volume   Culture   Final    NO GROWTH < 24 HOURS Performed at [place], [place] [address] [place], [place] [address]    Report Status PENDING  Incomplete  Blood Culture (routine x 2)     Status: None (Preliminary result)   Collection Time: 11/16/20  1:17 PM   Specimen: BLOOD RIGHT WRIST  Result Value Ref Range Status   Specimen Description BLOOD RIGHT WRIST  Final   Special Requests   Final    BOTTLES DRAWN AEROBIC AND ANAEROBIC Blood Culture adequate volume   Culture   Final    NO GROWTH < 24 HOURS Performed at [facility], [place] [address] [place], [place] [address]    Report Status PENDING  Incomplete         Radiology Studies: CT Head Wo Contrast  Result Date: 11/16/2020 CLINICAL DATA:  Mental status change, unknown cause. EXAM: CT HEAD WITHOUT CONTRAST TECHNIQUE: Contiguous axial images were obtained from the base of the skull through the vertex without intravenous contrast. COMPARISON:  Head CT 05/02/2020. FINDINGS: Brain: Moderately severe cerebral atrophy. Moderate cerebellar atrophy. Associated prominence of the ventricles and sulci. Redemonstrated chronic lacunar infarct within the left caudate nucleus. Moderate ill-defined hypoattenuation within the cerebral white matter is nonspecific, but compatible with chronic small vessel ischemic disease. There is no acute intracranial hemorrhage. No demarcated cortical infarct. No extra-axial fluid collection. No evidence of intracranial mass. No midline shift. Vascular: No hyperdense vessel.  Atherosclerotic calcifications. Skull: Normal. Negative for fracture or focal lesion. Sinuses/Orbits: Visualized orbits show no acute finding. No significant paranasal sinus disease. IMPRESSION: No evidence of acute intracranial abnormality. Redemonstrated chronic left basal ganglia lacunar infarct. Moderate cerebral white matter chronic small vessel ischemic disease. Moderately advanced cerebral atrophy with moderate cerebellar atrophy. Electronically Signed   By: Kellie Simmering DO   On: 11/16/2020 14:29   DG Chest  Port 1 View  Result Date: 11/16/2020 CLINICAL DATA:  Questionable sepsis, altered mental status EXAM: PORTABLE CHEST 1 VIEW COMPARISON:  05/18/2020 chest radiograph. FINDINGS: Intact sternotomy wires. Cardiac valvular prosthesis in place. Stable configuration of 3 lead left subclavian ICD.  Stable cardiomediastinal silhouette with top-normal heart size. No pneumothorax. No pleural effusion. Lungs appear clear, with no acute consolidative airspace disease and no pulmonary edema. IMPRESSION: No active disease. Electronically Signed   By: Ilona Sorrel M.D.   On: 11/16/2020 15:06   EEG adult  Result Date: 11/16/2020 Lora Havens, MD     11/16/2020  4:46 PM Patient Name: BRYLEY CHRISMAN MRN: [id number] Epilepsy Attending: Lora Havens Referring Physician/Provider: Dr Lennice Sites Date: 11/16/2020 Duration: 25.15 mins Patient history: 65 year old female with history of heart failure, Parkinson's, dementia, possibly seizures who presents the ED with altered mental status. EEG to evaluate for seizure Level of alertness: Awake AEDs during EEG study: None Technical aspects: This EEG study was done with scalp electrodes positioned according to the 10-20 International system of electrode placement. Electrical activity was acquired at Avyana Puffenbarger sampling rate of 500Hz  and reviewed with Deetya Drouillard high frequency filter of 70Hz  and Airen Dales low frequency filter of 1Hz . EEG data were recorded continuously and digitally stored. Description: The posterior dominant rhythm consists of 7.5 Hz activity of moderate voltage (25-35 uV) seen predominantly in posterior head regions, symmetric and reactive to eye opening and eye closing. EEG showed continuous generalized 5 to 7 Hz theta as well as intermittent 2-3hz  delta slowing. Per tech annotation patient was noted to have left arm twitching which was not visible on camera. Concomitant eeg before, during and after the event didn't show eeg change to suggest seizure.  Hyperventilation and photic  stimulation were not performed.   ABNORMALITY -Continuous slow, generalized IMPRESSION: This study is suggestive of mild diffuse encephalopathy, nonspecific etiology. No seizures or epileptiform discharges were seen throughout the recording. Per tech annotation patient was noted to have left arm twitching which was not visible on camera without concomitant eeg change and was most likely not epileptic [place]        Scheduled Meds: . sodium chloride flush  3 mL Intravenous Q12H   Continuous Infusions: . ceFEPime (MAXIPIME) IV    . dextrose 100 mL/hr at 11/17/20 0833     LOS: 0 days    Time spent: over 30 min    Fayrene Helper, MD [facility]   To contact the attending provider between 7A-7P or the covering provider during after hours 7P-7A, please log into the web site [email or web address] and access using universal [place] password for that web site. If you do not have the password, please call the hospital operator.  11/17/2020, 8:59 AM

## 2020-11-18 ENCOUNTER — Inpatient Hospital Stay (HOSPITAL_COMMUNITY): Payer: HMO

## 2020-11-18 ENCOUNTER — Encounter (HOSPITAL_COMMUNITY): Payer: Self-pay | Admitting: Family Medicine

## 2020-11-18 ENCOUNTER — Other Ambulatory Visit: Payer: Self-pay

## 2020-11-18 DIAGNOSIS — Z95810 Presence of automatic (implantable) cardiac defibrillator: Secondary | ICD-10-CM

## 2020-11-18 DIAGNOSIS — N390 Urinary tract infection, site not specified: Secondary | ICD-10-CM

## 2020-11-18 DIAGNOSIS — G9341 Metabolic encephalopathy: Secondary | ICD-10-CM | POA: Diagnosis not present

## 2020-11-18 DIAGNOSIS — G20 Parkinson's disease: Secondary | ICD-10-CM | POA: Diagnosis not present

## 2020-11-18 DIAGNOSIS — N179 Acute kidney failure, unspecified: Secondary | ICD-10-CM | POA: Diagnosis not present

## 2020-11-18 LAB — COMPREHENSIVE METABOLIC PANEL
ALT: 48 U/L — ABNORMAL HIGH (ref 0–44)
AST: 31 U/L (ref 15–41)
Albumin: 2.8 g/dL — ABNORMAL LOW (ref 3.5–5.0)
Alkaline Phosphatase: 48 U/L (ref 38–126)
Anion gap: 9 (ref 5–15)
BUN: 26 mg/dL — ABNORMAL HIGH (ref 8–23)
CO2: 20 mmol/L — ABNORMAL LOW (ref 22–32)
Calcium: 9.7 mg/dL (ref 8.9–10.3)
Chloride: 118 mmol/L — ABNORMAL HIGH (ref 98–111)
Creatinine, Ser: 0.62 mg/dL (ref 0.44–1.00)
GFR, Estimated: >60 mL/min (ref >60)
Glucose, Bld: 164 mg/dL — ABNORMAL HIGH (ref 70–99)
Potassium: 2.7 mmol/L — CL (ref 3.5–5.1)
Sodium: 147 mmol/L — ABNORMAL HIGH (ref 135–145)
Total Bilirubin: 0.6 mg/dL (ref 0.3–1.2)
Total Protein: 6.2 g/dL — ABNORMAL LOW (ref 6.5–8.1)

## 2020-11-18 LAB — CBC WITH DIFFERENTIAL/PLATELET
Abs Immature Granulocytes: 0.03 10*3/uL (ref 0.00–0.07)
Basophils Absolute: 0 10*3/uL (ref 0.0–0.1)
Basophils Relative: 0 %
Eosinophils Absolute: 0.2 10*3/uL (ref 0.0–0.5)
Eosinophils Relative: 3 %
HCT: 32.5 % — ABNORMAL LOW (ref 36.0–46.0)
Hemoglobin: 10.6 g/dL — ABNORMAL LOW (ref 12.0–15.0)
Immature Granulocytes: 1 %
Lymphocytes Relative: 33 %
Lymphs Abs: 2 10*3/uL (ref 0.7–4.0)
MCH: 29 pg (ref 26.0–34.0)
MCHC: 32.6 g/dL (ref 30.0–36.0)
MCV: 89 fL (ref 80.0–100.0)
Monocytes Absolute: 0.3 10*3/uL (ref 0.1–1.0)
Monocytes Relative: 5 %
Neutro Abs: 3.7 10*3/uL (ref 1.7–7.7)
Neutrophils Relative %: 58 %
Platelets: 67 10*3/uL — ABNORMAL LOW (ref 150–400)
RBC: 3.65 MIL/uL — ABNORMAL LOW (ref 3.87–5.11)
RDW: 19.2 % — ABNORMAL HIGH (ref 11.5–15.5)
WBC: 6.3 10*3/uL (ref 4.0–10.5)
nRBC: 0 % (ref 0.0–0.2)

## 2020-11-18 LAB — MAGNESIUM: Magnesium: 2.1 mg/dL (ref 1.7–2.4)

## 2020-11-18 LAB — PATHOLOGIST SMEAR REVIEW

## 2020-11-18 LAB — URINE CULTURE

## 2020-11-18 LAB — MRSA PCR SCREENING: MRSA by PCR: NEGATIVE

## 2020-11-18 LAB — PHOSPHORUS: Phosphorus: 1.7 mg/dL — ABNORMAL LOW (ref 2.5–4.6)

## 2020-11-18 MED ORDER — BIOTENE DRY MOUTH MT LIQD
15.0000 mL | OROMUCOSAL | Status: DC | PRN
Start: 2020-11-18 — End: 2020-11-27

## 2020-11-18 MED ORDER — DONEPEZIL HCL 10 MG PO TABS
10.0000 mg | ORAL_TABLET | Freq: Every day | ORAL | Status: DC
  Administered 2020-11-18: 10 mg via ORAL
  Filled 2020-11-18: qty 1

## 2020-11-18 MED ORDER — POLYVINYL ALCOHOL 1.4 % OP SOLN
1.0000 [drp] | Freq: Four times a day (QID) | OPHTHALMIC | Status: DC | PRN
  Administered 2020-11-21 – 2020-11-23 (×2): 1 [drp] via OPHTHALMIC
  Filled 2020-11-18: qty 15

## 2020-11-18 MED ORDER — ENSURE ENLIVE PO LIQD: 237.0000 mL | Freq: Three times a day (TID) | ORAL | Status: DC

## 2020-11-18 MED ORDER — CARBIDOPA-LEVODOPA 25-100 MG PO TABS
1.0000 | ORAL_TABLET | Freq: Three times a day (TID) | ORAL | Status: DC
  Administered 2020-11-18: 1 via ORAL
  Filled 2020-11-18 (×3): qty 1

## 2020-11-18 MED ORDER — DIVALPROEX SODIUM 125 MG PO DR TAB
125.0000 mg | DELAYED_RELEASE_TABLET | Freq: Every day | ORAL | Status: DC
  Administered 2020-11-18: 125 mg via ORAL
  Filled 2020-11-18: qty 1

## 2020-11-18 MED ORDER — ORAL CARE MOUTH RINSE
15.0000 mL | Freq: Two times a day (BID) | OROMUCOSAL | Status: DC
  Administered 2020-11-18 – 2020-11-26 (×17): 15 mL via OROMUCOSAL

## 2020-11-18 MED ORDER — GLYCOPYRROLATE 0.2 MG/ML IJ SOLN
0.2000 mg | INTRAMUSCULAR | Status: DC | PRN
  Filled 2020-11-18: qty 1

## 2020-11-18 MED ORDER — POTASSIUM CHLORIDE 10 MEQ/100ML IV SOLN: 10.0000 meq | INTRAVENOUS | Status: DC

## 2020-11-18 MED ORDER — POTASSIUM CHLORIDE 10 MEQ/100ML IV SOLN
10.0000 meq | INTRAVENOUS | Status: AC
  Administered 2020-11-18 (×6): 10 meq via INTRAVENOUS
  Filled 2020-11-18 (×6): qty 100

## 2020-11-18 MED ORDER — SODIUM CHLORIDE 0.9 % IV SOLN
2.0000 g | Freq: Two times a day (BID) | INTRAVENOUS | Status: DC
  Administered 2020-11-18: 2 g via INTRAVENOUS
  Filled 2020-11-18: qty 2

## 2020-11-18 MED ORDER — LORAZEPAM 2 MG/ML IJ SOLN
1.0000 mg | INTRAMUSCULAR | Status: DC
  Administered 2020-11-18 – 2020-11-20 (×6): 1 mg via INTRAVENOUS
  Filled 2020-11-18 (×6): qty 1

## 2020-11-18 MED ORDER — GLYCOPYRROLATE 0.2 MG/ML IJ SOLN
0.2000 mg | INTRAMUSCULAR | Status: DC | PRN
  Administered 2020-11-20 – 2020-11-26 (×3): 0.2 mg via INTRAVENOUS
  Filled 2020-11-18 (×2): qty 1

## 2020-11-18 NOTE — Progress Notes (Signed)
Paged regarding desaturation and suspected aspiration. Patient seen at bedside, with increased wob, satting in mid 80's on HFNC and NRB.  Coarse breath sounds on R, diminished on L.  Mental status relatively stable from this AM.  Will continue to monitor for now.  I'd discussed desaturation/concern for aspiration event with son prior to coming to bedside.  After discussion we'd discussed transition to DNR, he was agreeable to this.    A/P Follow CXR Supportive care with O2 Appreciate palliative care assistance with [place] conversations - will follow up. Gregary Signs from palliative care following up with family to discussion additional goals of care, but if patient does not rebound from this, will need to consider comfort measures.

## 2020-11-18 NOTE — Progress Notes (Signed)
Pharmacy Antibiotic Note  [name] is a 65 y.o. female admitted on 11/16/2020 with sepsis.  Patient presents from SNF with noted altered mental status and patient is warm to the touch.  Possible aspiration event at nursing facility vs UTI.  Pharmacy has been consulted for cefepime dosing.  Scr slightly elevated at 1.23 at admit, but now down to 0.62, baseline appears to be ~0.8. Monitor renal function for dose adjustments.  Plan:  Cefepime 2g IV q12hours F/u cultures, renal function, vanc levels as indicated  Height: 5' (152.4 cm) Weight: 40.2 kg (88 lb 10 oz) IBW/kg (Calculated) : 45.5  Temp (24hrs), Avg:97.3 F (36.3 C), Min:96.1 F (35.6 C), Max:97.7 F (36.5 C)  Recent Labs  Lab 11/16/20 1312 11/16/20 2030 11/17/20 0258 11/17/20 1417 11/17/20 2154 11/18/20 0615  WBC 8.9  --  11.0*  --   --  6.3  CREATININE 1.23* 0.99 0.74 0.79 0.57 0.62  LATICACIDVEN 1.9  --   --   --   --   --     Estimated Creatinine Clearance: 43.9 mL/min (by C-G formula based on SCr of 0.62 mg/dL).    Allergies  Allergen Reactions  . Sertraline Hcl Other (See Comments)    Swelling. Pt can take now - Per MAR  . Trospium Chloride Er Other (See Comments)    Constipation - Per MAR    Antimicrobials this admission: Vancomycin 2/22 >> 2/22 Cefepime 2/22 >>   Microbiology results: 2/22 BCx: ordered 2/22 UCx: ordered   [name] A. Levada Dy, PharmD, BCPS, FNKF Clinical Pharmacist [place] Please utilize Amion for appropriate phone number to reach the unit pharmacist ([place])   11/18/2020 9:06 AM

## 2020-11-18 NOTE — NC FL2 (Signed)
[place] LEVEL OF CARE SCREENING TOOL     IDENTIFICATION  Patient Name: [name] Birthdate: Mar 27, 1956 Sex: female Admission Date (Current Location): 11/16/2020  [facility] and [place] Number:  Herbalist and Address:  [facility] [place]. [facility], [place] [address], [place], [place] [address]      Provider Number: [id number]  Attending Physician Name and Address:  Elodia Florence., *  Relative Name and Phone Number:  [name] (son) [phone]    Current Level of Care: Hospital Recommended Level of Care: [place] Prior Approval Number:    Date Approved/Denied:   PASRR Number: [id number]  Discharge Plan: SNF    Current Diagnoses: Patient Active Problem List   Diagnosis Date Noted  . Hypernatremia 11/17/2020  . Acute metabolic encephalopathy 54/62/7035  . Malnutrition of moderate degree 04/15/2020  . Hypercalcemia 04/14/2020  . Dehydration, severe 04/13/2020  . Dehydration 04/13/2020  . Acute hypernatremia   . Failure to thrive in adult   . Closed left hip fracture, initial encounter ([place]) 12/30/2019  . Frequent falls   . Orthostatic hypotension 12/23/2019  . Syncopal episodes 12/22/2019  . 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 Renal insufficiency 12/22/2019  . Near syncope 09/04/2019  . AKI (acute kidney injury) ([place]) 09/04/2019  . Hypokalemia 09/04/2019  . Depression 09/04/2019  . Dementia ([place]) 09/04/2019  . Thrombocytopenia ([place]) 09/04/2019  . Low back pain 06/25/2019  . S/P AVR (aortic valve replacement) 12/12/2016  . Aortic stenosis 11/16/2016  . Severe aortic stenosis   . Polycythemia 06/06/2016  . Menopausal state 02/16/2016  . Hyperparathyroidism, primary ([place]) 02/16/2016  . Chronic diastolic CHF (congestive heart failure) ([place]) 05/07/2014  . Hx of colonic polyps 06/17/2013  . Ventricular tachycardia ([place]) 02/21/2011  . Automatic implantable  cardioverter-defibrillator in situ 06/02/2009  . Hyperlipidemia 05/29/2009  . SMOKER 05/29/2009  . Essential hypertension 05/29/2009  . Aortic valve disorder 05/29/2009  . CARDIOMYOPATHY, SECONDARY 05/29/2009  . CHF 05/29/2009  . BICUSPID AORTIC VALVE 05/29/2009  . LIVER FUNCTION TESTS, ABNORMAL, HX OF 05/29/2009  . CHOLECYSTITIS, HX OF 05/29/2009    Orientation RESPIRATION BLADDER Height & Weight     Self  Normal Continent,Indwelling catheter Weight: 88 lb 10 oz (40.2 kg) Height:  5' (152.4 cm)  BEHAVIORAL SYMPTOMS/MOOD NEUROLOGICAL BOWEL NUTRITION STATUS      Incontinent Diet (see DC summary)  AMBULATORY STATUS COMMUNICATION OF NEEDS Skin   Extensive Assist Verbally Normal                       Personal Care Assistance Level of Assistance  Bathing,Feeding,Dressing Bathing Assistance: Maximum assistance Feeding assistance: Maximum assistance Dressing Assistance: Maximum assistance     Functional Limitations Info  Sight,Hearing,Speech Sight Info: Impaired Hearing Info: Impaired Speech Info: Adequate    SPECIAL CARE FACTORS FREQUENCY  PT (By licensed PT),OT (By licensed OT)     PT Frequency: 5X per week OT Frequency: 5X per week            Contractures      Additional Factors Info  Code Status,Allergies,Psychotropic,Isolation Precautions Code Status Info: FULL Allergies Info: Sertraline Hcl, Trospium Chloride Er Psychotropic Info: Depakote   Isolation Precautions Info: COVID positive 2+ weeks ago     Current Medications (11/18/2020):  This is the current hospital active medication list Current Facility-Administered Medications  Medication Dose Route Frequency Provider Last Rate Last Admin  . acetaminophen (TYLENOL) tablet 650  mg  650 mg Oral Q6H PRN Karmen Bongo, MD       Or  . acetaminophen (TYLENOL) suppository 650 mg  650 mg Rectal Q6H PRN Karmen Bongo, MD      . albuterol (PROVENTIL) (2.5 MG/3ML) 0.083% nebulizer solution 2.5 mg  2.5 mg  Nebulization Q2H PRN Karmen Bongo, MD      . bisacodyl (DULCOLAX) suppository 10 mg  10 mg Rectal Daily PRN Karmen Bongo, MD      . carbidopa-levodopa (SINEMET IR) 25-100 MG per tablet immediate release 1 tablet  1 tablet Oral TID Elodia Florence., MD   1 tablet at 11/18/20 1009  . ceFEPIme (MAXIPIME) 2 g in sodium chloride 0.9 % 100 mL IVPB  2 g Intravenous Q12H Pierce, Dwayne A, RPH 200 mL/hr at 11/18/20 1018 2 g at 11/18/20 1018  . Chlorhexidine Gluconate Cloth 2 % PADS 6 each  6 each Topical Daily Elodia Florence., MD   6 each at 11/18/20 1019  . divalproex (DEPAKOTE) DR tablet 125 mg  125 mg Oral Daily Elodia Florence., MD   125 mg at 11/18/20 1009  . donepezil (ARICEPT) tablet 10 mg  10 mg Oral Daily Elodia Florence., MD   10 mg at 11/18/20 1017  . hydroxypropyl methylcellulose / hypromellose (ISOPTO TEARS / GONIOVISC) 2.5 % ophthalmic solution 1 drop  1 drop Both Eyes Daily PRN Karmen Bongo, MD      . MEDLINE mouth rinse  15 mL Mouth Rinse BID Elodia Florence., MD   15 mL at 11/18/20 1019  . morphine 2 MG/ML injection 2 mg  2 mg Intravenous Q2H PRN Karmen Bongo, MD      . ondansetron [facility]) tablet 4 mg  4 mg Oral Q6H PRN Karmen Bongo, MD       Or  . ondansetron [facility]) injection 4 mg  4 mg Intravenous Q6H PRN Karmen Bongo, MD      . potassium chloride 10 mEq in 100 mL IVPB  10 mEq Intravenous Q1 Hr x 6 Elodia Florence., MD 100 mL/hr at 11/18/20 1008 10 mEq at 11/18/20 1008  . sodium chloride flush (NS) 0.9 % injection 3 mL  3 mL Intravenous Q12H Karmen Bongo, MD   3 mL at 11/17/20 2135     Discharge Medications: Please see discharge summary for a list of discharge medications.  Relevant Imaging Results:  Relevant Lab Results:   Additional Information ss#[id number]. Patient has had the [place] vaccinations in March 2021  Glennon Hamilton, [place] Work

## 2020-11-18 NOTE — Significant Event (Addendum)
Rapid Response Event Note   Reason for Call :  Acute oxygen desaturation to 65% while on room air -Pt sustaining SpO2 81-84% on 15L HFNC & 100% NRB  Initial Focused Assessment:  Pt lying in bed, alert. Skin is warm, moist. She follows commands and is answering questions appropriately. Lung sounds are rhonchus to the right, diminished on the left. She is noted to have thick, clear secretions from her nose and mouth. Following nasotracheal suctioning, lung sounds are diminished L>R. RR is tachypneic, she does not appear labored.  VS: BP 159/110, HR 71, RR 30, SpO2 84% on 15L HFNC & 100% NRB  Interventions:  NTS CXR  Plan of Care:  -Pt was able to be weaned down to 15L HFNC, oxygen saturation >96% -Oral care per protocol -Wean oxygen as pt tolerates -NTS PRN  Call rapid response for additional needs  Event Summary:  MD Notified: Dr. Florene Glen Call Time: [place] Time: 6378 End Time: [place], RN

## 2020-11-18 NOTE — Progress Notes (Signed)
Brief Palliative Medicine Progress Note:  Received updates from Gregary Signs, NP that this patient is transitioning to full comfort measures. Gregary Signs notes patient has [employer] ICD in place. Spoke to representative/Joey and requested that ICD be deactivated - he states he will be here tonight to deactivate device.  [name] [facility] Palliative Medicine Team Team Phone: [phone] NO CHARGE

## 2020-11-18 NOTE — Progress Notes (Signed)
Sent chat msg. To Dr. Florene Glen.  The patient's son, [name]. reached out and would like Dr. Florene Glen to call him at [phone]

## 2020-11-18 NOTE — Progress Notes (Signed)
PROGRESS NOTE    [name]  GYI:[id number] DOB: April 03, 1956 DOA: 11/16/2020 PCP: Lujean Amel, MD   Chief Complaint  Patient presents with  . Altered Mental Status    Brief Narrative:  [name] is a 65 y.o. female with medical history significant of Parkinson's dementia; HLD; HTN; AICD placement; CAD; and chronic systolic CHF presenting with AMS.  Her son and her brother were present throughout the evaluation.  The patient is verbal with 1-2 words and is frail and cachectic.  Family reports that she had a hip fracture about a year ago and has been non-ambulatory since.  She has been failing since with increasing difficulties with feeding, speaking.  She was last able to feed herself a month or more ago and was able to have a limited conversation about 2 weeks ago.  Her brother believes that she would not want to live this way and certainly would not desire resuscitation but her son is not ready to make this decision.  ED Course:  Progressive dementia, cachexia.  More confused this AM, ?aspiration event.  Temp 101.5, looks like UTI but no evidence of sepsis.  Na+ 163.  Given IVF and tylenol, appears to be improving.  Considering change in code status.  Neurology recommends EEG but no plan to consult at this time.  COVID + 2 weeks ago.  Assessment & Plan:   Principal Problem:   Acute metabolic encephalopathy Active Problems:   Hyperlipidemia   Essential hypertension   Automatic implantable cardioverter-defibrillator in situ   Chronic diastolic CHF (congestive heart failure) (HCC)   AKI (acute kidney injury) ([place])   Dementia due to Parkinson's disease without behavioral disturbance ([place])   Hypernatremia  Acute metabolic encephalopathy -Patient presenting with encephalopathy as evidenced by her obtunded/unresponsiveness -improved this AM, she's A&Ox1-2 (knows hospital, but [facility], doesn't know month).  Doesn't move legs (though this is chronic per  family/staff).  This AM, gripped hands L>R, but otherwise, not significant movement of upper extremities.  Gaze seems fixed when her eyes are open.   -suspected 2/2 UTI in setting of her dementia +/- hypernatremia -Will observe for now with IVF and abx -EEG with mild diffuse encephalopathy, nonspecific -head CT without acute abnormality  -follow MRI - consider neurology discussion/cs -TSH wnl.  B12 and folate wnl. Ammonia wnl.   Hypernatremia due to dehydration, with AKI  Hypotension -Appears to be profoundly dehydrated, which likely is associated with her inability to take PO due to AMS as well as possible UTI -AKI improved, hypotension resolved -continue to follow q8 Na.  She's overcorrected.  Hold further IVF for now.   -Hold nephrotoxic agents as much as possible  Dysphagia: SLP saw 2/24, recommended dysphagia 1, thin liquids via teaspoon/edge of cup, crushed meds with puree.  Pyuria  Concern for UTI  SIRS Follow blood (ng) and urine cultures (pending) Continue cefepime  CXR without acute disease Tachypnea with leukocytosis, concern for UTI  Advanced Parkinson's dementia -She appears to have advanced dementia -Given concern for AMS, she does not appear to be able to swallow without aspiration risk at this time -resume sinemet, aricept, depakote for now (continue to hold zoloft for now) -Also with palliative care consultation  Malnutrition -Body mass index is 18.51 kg/m..  -The patient has at least 2 indicators for malnutrition (insufficient energy intake, weight loss, loss of muscle mass, loss of subcutaneous fat diminished functional status).  -This is likely due to starvation related/chronic disease -Will obtain a  nutrition consult for further recommendations.  Recent COVID infection -[name] faxed notice that pt tested positive 10/08/20.  She's out of quarantine.   -positive on 2/22, but this is within 90 day window.  Can continue to follow off  precautions.  HTN -continue to hold BP meds (bp appropriate) - hold amlodipine/coreg  HLD -Hold Crestor for now  CAD/chronic systolic CHF with AICD -Hold ASA given concern for aspiration risk -Most recent echo in 08/2019 with EF 55-60% and grade 1 diastolic dysfunction -She may be at greater risk for volume overload in this circumstance and needs to be closely monitored  Thrombocytopenia -Hold AC and place SCDs -likely 2/2 acute infection (UTI) -Will follow smear  Concern for afib She's on telemetry  EKG this AM with concern for atrial fib (I think this is more likely related to fine tremor that she has).  Will continue to monitor on tele, consider repeat ekg and/or discussion with cardiology.  She's not appropriate for anticoagulation at this time with thrombocytopenia. EKG this AM with paced rhythm, follow   Goals of care - Per previous provider "While the uncle believes that the patient would not want to live like this and does not have a quality of life, her son is not ready to transition to DNR - much less comfort measures" -Will order palliative care consult - pending, will reach back out to family  DVT prophylaxis: SCD's with thrombocytopenia Code Status: full  Family Communication: none at bedside (discussed with son 2/23 over phone) Disposition:   Status is: Observation  The patient will require care spanning > 2 midnights and should be moved to inpatient because: Inpatient level of care appropriate due to severity of illness  Dispo: The patient is from: SNF              Anticipated d/c is to: SNF              Anticipated d/c date is: > 3 days              Patient currently is not medically stable to d/c.   Difficult to place patient No       Consultants:  none Procedures:  EEG  IMPRESSION: This study is suggestive of mild diffuse encephalopathy, nonspecific etiology. No seizures or epileptiform discharges were seen throughout the recording.  Per  tech annotation patient was noted to have left arm twitching which was not visible on camera without concomitant eeg change and was most likely not epileptic   Antimicrobials:  Anti-infectives (From admission, onward)   Start     Dose/Rate Route Frequency Ordered Stop   11/18/20 1000  ceFEPIme (MAXIPIME) 2 g in sodium chloride 0.9 % 100 mL IVPB        2 g 200 mL/hr over 30 Minutes Intravenous Every 12 hours 11/18/20 0907     11/17/20 1400  vancomycin (VANCOREADY) IVPB 500 mg/100 mL  Status:  Discontinued        500 mg 100 mL/hr over 60 Minutes Intravenous Every 24 hours 11/16/20 1444 11/16/20 1704   11/17/20 1300  ceFEPIme (MAXIPIME) 2 g in sodium chloride 0.9 % 100 mL IVPB  Status:  Discontinued        2 g 200 mL/hr over 30 Minutes Intravenous Every 24 hours 11/16/20 1711 11/18/20 0907   11/17/20 0200  ceFEPIme (MAXIPIME) 2 g in sodium chloride 0.9 % 100 mL IVPB  Status:  Discontinued        2 g 200 mL/hr over  30 Minutes Intravenous Every 12 hours 11/16/20 1444 11/16/20 1711   11/16/20 1345  vancomycin (VANCOREADY) IVPB 1000 mg/200 mL        1,000 mg 200 mL/hr over 60 Minutes Intravenous  Once 11/16/20 1330 11/16/20 1615   11/16/20 1345  ceFEPIme (MAXIPIME) 2 g in sodium chloride 0.9 % 100 mL IVPB        2 g 200 mL/hr over 30 Minutes Intravenous  Once 11/16/20 1331 11/16/20 1433         Subjective: A&Ox1-2 No complaints Denies pain/discomfort  Objective: Vitals:   11/18/20 0200 11/18/20 0400 11/18/20 0415 11/18/20 0546  BP: 115/75 105/71  119/77  Pulse: (!) 49 (!) 51 (!) 50 (!) 52  Resp: 16 19 19 19   Temp: 97.6 F (36.4 C) 97.7 F (36.5 C)  97.7 F (36.5 C)  TempSrc: Axillary Axillary  Axillary  SpO2: 98% 98% 98% 98%  Weight:      Height:        Intake/Output Summary (Last 24 hours) at 11/18/2020 0915 Last data filed at 11/18/2020 0700 Gross per 24 hour  Intake 1962.65 ml  Output 300 ml  Net 1662.65 ml   Filed Weights   11/16/20 1318 11/17/20 1415  Weight: 43  kg 40.2 kg    Examination:  General: appears older than stated age, chronically ill appearing Cardiovascular: Heart sounds show a regular rate, and rhythm Lungs: Clear to auscultation bilaterally with good air movement.  Abdomen: Soft, nontender, nondistended Neurological: Alert and oriented 1-2. Squeezes hands L>R to command, but does not move upper extremities significantly otherwise.  Doesn't move lower extremities (chronic per discussion with fam/staff).  Eyes are open today, symmetric pupils.  Gaze appears fixed, when asked to look at me, makes minimal movement to fix gaze in my direction.  Extremities: No clubbing or cyanosis. No edema.   Data Reviewed: I have personally reviewed following labs and imaging studies  CBC: Recent Labs  Lab 11/16/20 1312 11/17/20 0258 11/18/20 0615  WBC 8.9 11.0* 6.3  NEUTROABS 5.9  --  3.7  HGB 14.2 13.6 10.6*  HCT 46.6* 42.2 32.5*  MCV 92.1 91.9 89.0  PLT 86* 65* 67*    Basic Metabolic Panel: Recent Labs  Lab 11/16/20 2030 11/17/20 0258 11/17/20 1417 11/17/20 2154 11/18/20 0615  NA 162* 161* 160* 153* 147*  K 3.5 4.3 3.2* 2.9* 2.7*  CL 129* 128* 124* 122* 118*  CO2 23 21* 25 20* 20*  GLUCOSE 117* 110* 143* 159* 164*  BUN 42* 41* 35* 34* 26*  CREATININE 0.99 0.74 0.79 0.57 0.62  CALCIUM 10.5* 10.3 10.4* 9.7 9.7  MG  --   --   --   --  2.1  PHOS  --   --   --   --  1.7*    GFR: Estimated Creatinine Clearance: 43.9 mL/min (by C-G formula based on SCr of 0.62 mg/dL).  Liver Function Tests: Recent Labs  Lab 11/16/20 1312 11/18/20 0615  AST 52* 31  ALT 49* 48*  ALKPHOS 63 48  BILITOT 0.9 0.6  PROT 8.3* 6.2*  ALBUMIN 3.7 2.8*    CBG: Recent Labs  Lab 11/16/20 1314  GLUCAP 114*     Recent Results (from the past 240 hour(s))  Resp Panel by RT-PCR (Flu A&B, Covid) Nasopharyngeal Swab     Status: Abnormal   Collection Time: 11/16/20  1:12 PM   Specimen: Nasopharyngeal Swab; Nasopharyngeal(NP) swabs in vial  transport medium  Result Value Ref Range Status  SARS Coronavirus 2 by RT PCR POSITIVE (A) NEGATIVE Final    Comment: RESULT CALLED TO, READ BACK BY AND VERIFIED WITH: RN M.EINIK AT 7867 ON 11/16/2020 BY T.SAAD (NOTE) SARS-CoV-2 target nucleic acids are DETECTED.  The SARS-CoV-2 RNA is generally detectable in upper respiratory specimens during the acute phase of infection. Positive results are indicative of the presence of the identified virus, but do not rule out bacterial infection or co-infection with other pathogens not detected by the test. Clinical correlation with patient history and other diagnostic information is necessary to determine patient infection status. The expected result is Negative.  Fact Sheet for Patients: EntrepreneurPulse.com.au  Fact Sheet for Healthcare Providers: IncredibleEmployment.be  This test is not yet approved or cleared by the [place] FDA and  has been authorized for detection and/or diagnosis of SARS-CoV-2 by FDA under an Emergency Use Authorization (EUA).  This EUA will remain in effect (meaning this te st can be used) for the duration of  the COVID-19 declaration under Section 564(b)(1) of the Act, 21 U.S.C. section 360bbb-3(b)(1), unless the authorization is terminated or revoked sooner.     Influenza A by PCR NEGATIVE NEGATIVE Final   Influenza B by PCR NEGATIVE NEGATIVE Final    Comment: (NOTE) The Xpert Xpress SARS-CoV-2/FLU/RSV plus assay is intended as an aid in the diagnosis of influenza from Nasopharyngeal swab specimens and should not be used as a sole basis for treatment. Nasal washings and aspirates are unacceptable for Xpert Xpress SARS-CoV-2/FLU/RSV testing.  Fact Sheet for Patients: EntrepreneurPulse.com.au  Fact Sheet for Healthcare Providers: IncredibleEmployment.be  This test is not yet approved or cleared by the [place] FDA and has  been authorized for detection and/or diagnosis of SARS-CoV-2 by FDA under an Emergency Use Authorization (EUA). This EUA will remain in effect (meaning this test can be used) for the duration of the COVID-19 declaration under Section 564(b)(1) of the Act, 21 U.S.C. section 360bbb-3(b)(1), unless the authorization is terminated or revoked.  Performed at [facility], [place] [address], [place], [place] [address]   Blood Culture (routine x 2)     Status: None (Preliminary result)   Collection Time: 11/16/20  1:12 PM   Specimen: BLOOD RIGHT FOREARM  Result Value Ref Range Status   Specimen Description BLOOD RIGHT FOREARM  Final   Special Requests   Final    BOTTLES DRAWN AEROBIC AND ANAEROBIC Blood Culture adequate volume   Culture   Final    NO GROWTH 2 DAYS Performed at [facility], [place] [address] [place], [place] [address]    Report Status PENDING  Incomplete  Urine culture     Status: None (Preliminary result)   Collection Time: 11/16/20  1:12 PM   Specimen: In/Out Cath Urine  Result Value Ref Range Status   Specimen Description IN/OUT CATH URINE  Final   Special Requests NONE  Final   Culture   Final    CULTURE REINCUBATED FOR BETTER GROWTH Performed at [facility], [place] [address] [place], [place] [address]    Report Status PENDING  Incomplete  Blood Culture (routine x 2)     Status: None (Preliminary result)   Collection Time: 11/16/20  1:17 PM   Specimen: BLOOD RIGHT WRIST  Result Value Ref Range Status   Specimen Description BLOOD RIGHT WRIST  Final   Special Requests   Final    BOTTLES DRAWN AEROBIC AND ANAEROBIC Blood Culture adequate volume   Culture   Final  NO GROWTH 2 DAYS Performed at [facility], [place] [address], [place], [place] [address]    Report Status PENDING  Incomplete  MRSA PCR Screening     Status: None   Collection Time: 11/17/20 11:39 PM   Specimen: Nasal Mucosa; Nasopharyngeal  Result Value Ref Range Status   MRSA by  PCR NEGATIVE NEGATIVE Final    Comment:        The GeneXpert MRSA Assay (FDA approved for NASAL specimens only), is one component of a comprehensive MRSA colonization surveillance program. It is not intended to diagnose MRSA infection nor to guide or monitor treatment for MRSA infections. Performed at [facility], [place] [address] [place], [place] [address]          Radiology Studies: CT Head Wo Contrast  Result Date: 11/16/2020 CLINICAL DATA:  Mental status change, unknown cause. EXAM: CT HEAD WITHOUT CONTRAST TECHNIQUE: Contiguous axial images were obtained from the base of the skull through the vertex without intravenous contrast. COMPARISON:  Head CT 05/02/2020. FINDINGS: Brain: Moderately severe cerebral atrophy. Moderate cerebellar atrophy. Associated prominence of the ventricles and sulci. Redemonstrated chronic lacunar infarct within the left caudate nucleus. Moderate ill-defined hypoattenuation within the cerebral white matter is nonspecific, but compatible with chronic small vessel ischemic disease. There is no acute intracranial hemorrhage. No demarcated cortical infarct. No extra-axial fluid collection. No evidence of intracranial mass. No midline shift. Vascular: No hyperdense vessel.  Atherosclerotic calcifications. Skull: Normal. Negative for fracture or focal lesion. Sinuses/Orbits: Visualized orbits show no acute finding. No significant paranasal sinus disease. IMPRESSION: No evidence of acute intracranial abnormality. Redemonstrated chronic left basal ganglia lacunar infarct. Moderate cerebral white matter chronic small vessel ischemic disease. Moderately advanced cerebral atrophy with moderate cerebellar atrophy. Electronically Signed   By: Kellie Simmering DO   On: 11/16/2020 14:29   DG Chest Port 1 View  Result Date: 11/16/2020 CLINICAL DATA:  Questionable sepsis, altered mental status EXAM: PORTABLE CHEST 1 VIEW COMPARISON:  05/18/2020 chest radiograph. FINDINGS:  Intact sternotomy wires. Cardiac valvular prosthesis in place. Stable configuration of 3 lead left subclavian ICD. Stable cardiomediastinal silhouette with top-normal heart size. No pneumothorax. No pleural effusion. Lungs appear clear, with no acute consolidative airspace disease and no pulmonary edema. IMPRESSION: No active disease. Electronically Signed   By: Ilona Sorrel M.D.   On: 11/16/2020 15:06   EEG adult  Result Date: 11/16/2020 Lora Havens, MD     11/16/2020  4:46 PM Patient Name: [name] MRN: [id number] Epilepsy Attending: Lora Havens Referring Physician/Provider: Dr Lennice Sites Date: 11/16/2020 Duration: 25.15 mins Patient history: 65 year old female with history of heart failure, Parkinson's, dementia, possibly seizures who presents the ED with altered mental status. EEG to evaluate for seizure Level of alertness: Awake AEDs during EEG study: None Technical aspects: This EEG study was done with scalp electrodes positioned according to the 10-20 International system of electrode placement. Electrical activity was acquired at a sampling rate of 500Hz  and reviewed with a high frequency filter of 70Hz  and a low frequency filter of 1Hz . EEG data were recorded continuously and digitally stored. Description: The posterior dominant rhythm consists of 7.5 Hz activity of moderate voltage (25-35 uV) seen predominantly in posterior head regions, symmetric and reactive to eye opening and eye closing. EEG showed continuous generalized 5 to 7 Hz theta as well as intermittent 2-3hz  delta slowing. Per tech annotation patient was noted to have left arm twitching which was not visible on camera.  Concomitant eeg before, during and after the event didn't show eeg change to suggest seizure.  Hyperventilation and photic stimulation were not performed.   ABNORMALITY -Continuous slow, generalized IMPRESSION: This study is suggestive of mild diffuse encephalopathy, nonspecific etiology. No seizures or  epileptiform discharges were seen throughout the recording. Per tech annotation patient was noted to have left arm twitching which was not visible on camera without concomitant eeg change and was most likely not epileptic [place]        Scheduled Meds: . Chlorhexidine Gluconate Cloth  6 each Topical Daily  . mouth rinse  15 mL Mouth Rinse BID  . sodium chloride flush  3 mL Intravenous Q12H   Continuous Infusions: . ceFEPime (MAXIPIME) IV    . potassium chloride 10 mEq (11/18/20 0906)     LOS: 1 day    Time spent: over 30 min    Fayrene Helper, MD [facility]   To contact the attending provider between 7A-7P or the covering provider during after hours 7P-7A, please log into the web site [email or web address] and access using universal [place] password for that web site. If you do not have the password, please call the hospital operator.  11/18/2020, 9:15 AM

## 2020-11-18 NOTE — Progress Notes (Signed)
Daily Progress Note   Patient Name: [name]       Date: 11/18/2020 DOB: 24-Jan-1956  Age: 65 y.o. MRN#: [id number] Attending Physician: Elodia Florence., * Primary Care Physician: Lujean Amel, MD Admit Date: 11/16/2020  Reason for Follow-up: goals of care  Subjective: 16:42--Notified by Dr. Florene Glen that patient had an aspiration event and is hypoxic requiring NRB. He has just spoken with the son who was agreeable to change code status to DNR  17:50-- Patient is currently on 10L HFNC with oxygen saturation 98%. I met at bedside with son [name]) and brother Okey Dupre). Niece Dewaine Oats) was present by speaker phone. We discussed her current illness and what it means in the larger context of her ongoing co-morbidities.  Provided education and counseling on the natural disease trajectory of Parkinson's/ dementia. This includes decreased ability to communicate, ambulate, swallow, and maintain continence. Discussed that it is ultimately a terminal condition.  Provided education that patient has known dysphagia secondary to Parkinson's/dementia, which includes risk for aspiration. Discussed that unfortunately there is not a way to eliminate the risk for aspiration and that aspiration events will be recurrent. Discussed that aspiration can result in respiratory issues including pneumonia and hypoxia.   Allow family time to lament the course of her Parkinson's/dementia. They share that she has declined significantly over the past few months, and it is difficult for them to see her in her current condition. They express frustration over the care she received at the SNF.    The difference between continued supportive medical interventions and comfort care was discussed. I reviewed the concept  of a comfort path with family and recommended stopping supportive medical interventions and focus on her comfort and dignity rather than prolonging life. Introduced hospice philosophy and information on services at SNF vs residential hospice services. Discussed that she likely has a prognosis of weeks at this point and would be appropriate for residential hospice. Family all agrees they want her transferred to [facility] if possible.   Discussed transitioning to comfort care while in the hospital, and what that would look like--keeping her clean and dry, no labs, no artificial hydration or feeding, no antibiotics, minimizing of medications, comfort feeds, and medication for pain and dyspnea as needed. Also discussed need to deactivate her ICD to prevent it from firing  and causing pain.     Length of Stay: 1  Current Medications: Scheduled Meds:  . LORazepam  1 mg Intravenous Q4H  . mouth rinse  15 mL Mouth Rinse BID  . sodium chloride flush  3 mL Intravenous Q12H     PRN Meds: acetaminophen **OR** acetaminophen, albuterol, antiseptic oral rinse, bisacodyl, glycopyrrolate **OR** glycopyrrolate, hydroxypropyl methylcellulose / hypromellose, morphine injection, ondansetron **OR** ondansetron (ZOFRAN) IV, polyvinyl alcohol  Physical Exam Vitals reviewed.  Constitutional:      Appearance: She is ill-appearing.  Cardiovascular:     Rate and Rhythm: Normal rate.  Pulmonary:     Effort: Tachypnea present.     Comments: Breathing appears mildly labored Neurological:     Mental Status: She is alert.     Motor: Weakness present.     Comments: Follows commands             Vital Signs: BP (!) 118/95 (BP Location: Right Arm)   Pulse 79   Temp 97.6 F (36.4 C) (Oral)   Resp 20   Ht 5' (1.524 m)   Wt 40.2 kg   SpO2 100%   BMI 17.31 kg/m  SpO2: SpO2: 100 %  Intake/output summary:   Intake/Output Summary (Last 24 hours) at 11/18/2020 1850 Last data filed at 11/18/2020 1600 Gross per  24 hour  Intake 1662.65 ml  Output 525 ml  Net 1137.65 ml   LBM: Last BM Date:  (Unknown.) Baseline Weight: Weight: 43 kg Most recent weight: Weight: 40.2 kg       Palliative Assessment/Data: PPS 10%      Palliative Care Assessment & Plan   HPI/Patient Profile: 65 y.o. female  with past medical history of Parkinson's dementia, hypertension, hyperlipidemia, AICD placement, CAD, and chronic systolic CHF who presented to the emergency department on 11/16/2020 with altered mental status. Per intake H&P, patient is verbal with 1-2 words and is frail and cachectic. Family reports she had a hip fracture about a year ago and has been non-ambulatory since. She was COVID positive 2 weeks ago.  ED Course: Reportedly more confused this morning, possible aspiration event. Febrile to 101.5, urinalysis suspicious for UTI but no evidence of sepsis. Sodium 163. Given IV fluid and tylenol. Family considering change in code status. Neurology recommending EEG.   Assessment: - acute metabolic encephalopathy - chronic diastolic CHF - AICD present - pyuria, concern for UTI - advanced Parkinson's dementia - recent COVID infection - hypernatremia due to dehydration - malnutrition - AKI, improved - dysphagia - acute hypoxic respiratory failure secondary to aspiration event  Recommendations/Plan:  Full comfort measures initiated  DNR/DNI as previously documented  Transfer to [facility] when bed becomes available - TOC consult placed; will notify hospice liaison in the morning  Chart review reveals ICD is [employer] - will call and request deactivation  Added orders for symptom management at EOL as well as discontinued orders that were not focused on comfort  oral medications discontinued due to aspiration risk  Unrestricted visitation orders were placed per current [place] EOL visitation policy   Nursing to administer PRN medications as clinically necessary to ensure EOL  comfort  Spiritual care consult  PMT will continue to follow holistically   Symptom Management:   Morphine prn for pain or dyspnea  Scheduled lorazepam (ATIVAN) 1 mg IV every 4 hours   Haloperidol (HALDOL) prn for agitation   Glycopyrrolate (ROBINUL) for excessive secretions  Ondansetron (ZOFRAN) prn for nausea  Polyvinyl alcohol (LIQUIFILM TEARS) prn  for dry eyes  Antiseptic oral rinse (BIOTENE) prn for dry mouth  Code Status:  DNR/DNI  Prognosis:   less than 2 weeks  Discharge Planning:  Residential hospice  Care plan was discussed with Dr. [name], nursing  Thank you for allowing the Palliative Medicine Team to assist in the care of this patient.   Total Time 67 minutes Prolonged Time Billed  no       Greater than 50%  of this time was spent counseling and coordinating care related to the above assessment and plan.  Lavena Bullion, NP  Please contact Palliative Medicine Team phone at [phone] for questions and concerns.

## 2020-11-18 NOTE — Progress Notes (Signed)
Initial Nutrition Assessment  DOCUMENTATION CODES:   Underweight  INTERVENTION:  Provide Ensure Enlive po TID as tolerated, each supplement provides 350 kcal and 20 grams of protein  NUTRITION DIAGNOSIS:   Increased nutrient needs related to chronic illness (CHF) as evidenced by estimated needs.  GOAL:   Patient will meet greater than or equal to 90% of their needs  MONITOR:   PO intake,Supplement acceptance,Skin,Weight trends,Labs,I & O's  REASON FOR ASSESSMENT:   Consult Assessment of nutrition requirement/status  ASSESSMENT:   65 y.o. female with medical history significant of Parkinson's dementia; HLD; HTN; AICD placement; CAD; and chronic systolic CHF presenting with AMS. COVID positive 2 weeks ago.  Pt with dementia and poor historian. Family reports pt with increasing difficulties with feeding and speaking. Per weight records, pt with a 20% weight loss over the past 5 months which is significant for time frame. Pt is currently on a dysphagia 1 diet with thin liquids. Noted pt with desaturation and suspected aspiration event today. Per MD, if pt does not rebound will need to consider comfort measures. RD to order nutritional supplementation to aid in adequate nutrition. Unable to complete Nutrition-Focused physical exam at this time.   Labs and medications reviewed.   Diet Order:   Diet Order            DIET - DYS 1 Room service appropriate? Yes; Fluid consistency: Thin  Diet effective now                 EDUCATION NEEDS:   Not appropriate for education at this time  Skin:  Skin Assessment: Reviewed RN Assessment  Last BM:  Unknown  Height:   Ht Readings from Last 1 Encounters:  11/17/20 5' (1.524 m)    Weight:   Wt Readings from Last 1 Encounters:  11/17/20 40.2 kg    Ideal Body Weight:  45.45 kg   BMI:  Body mass index is 17.31 kg/m.  Estimated Nutritional Needs:   Kcal:  1500-1700  Protein:  75-85 grams  Fluid:  >/= 1.5  L/day  Corrin Parker, MS, RD, LDN RD pager number/after hours weekend pager number on Amion.

## 2020-11-18 NOTE — Progress Notes (Signed)
   11/18/20 0200  Assess: MEWS Score  Temp 97.6 F (36.4 C)  BP 115/75  Pulse Rate (!) 49  ECG Heart Rate (!) 48  Resp 16  SpO2 98 %  O2 Device Room Air  Patient Activity (if Appropriate) In bed  Assess: MEWS Score  MEWS Temp 0  MEWS Systolic 0  MEWS Pulse 1  MEWS RR 0  MEWS LOC 1  MEWS Score 2  MEWS Score Color Yellow  Assess: if the MEWS score is Yellow or Red  Were vital signs taken at a resting state? Yes  Focused Assessment No change from prior assessment  Early Detection of Sepsis Score *See Row Information* Low  MEWS guidelines implemented *See Row Information* Yes  Treat  MEWS Interventions Escalated (See documentation below)  Pain Score Asleep  Take Vital Signs  Increase Vital Sign Frequency  Yellow: Q 2hr X 2 then Q 4hr X 2, if remains yellow, continue Q 4hrs  Escalate  MEWS: Escalate Yellow: discuss with charge nurse/RN and consider discussing with provider and RRT  Notify: Charge Nurse/RN  Name of Charge Nurse/RN Notified Isaiah, RN  Date Charge Nurse/RN Notified 11/18/20  Time Charge Nurse/RN Notified 0300  Notify: Provider  Provider Name/Title Admitting MD aware.  Notify: Rapid Response  Name of Rapid Response RN Notified N/A  Document  Patient Outcome Other (Comment) (Patient stable. Continue to monitor.)   Patient's HR has maintained 40s-50s. Patient not alert but responds to voice. Patient does not open her eyes. At shift report this was confirmed by dayshift as the same since admitted. In, ED, the MD noted patient to be obtunded. No distress noted at this time. Will continue to monitor.

## 2020-11-18 NOTE — Progress Notes (Signed)
Sent note to Dr. Florene Glen: I just received a call from [facility].  They will not be able to complete on this patient d/t incompatibility with her AICD.

## 2020-11-19 DIAGNOSIS — Z789 Other specified health status: Secondary | ICD-10-CM

## 2020-11-19 DIAGNOSIS — Z66 Do not resuscitate: Secondary | ICD-10-CM

## 2020-11-19 DIAGNOSIS — G9341 Metabolic encephalopathy: Secondary | ICD-10-CM | POA: Diagnosis not present

## 2020-11-19 DIAGNOSIS — Z95810 Presence of automatic (implantable) cardiac defibrillator: Secondary | ICD-10-CM | POA: Diagnosis not present

## 2020-11-19 DIAGNOSIS — R0989 Other specified symptoms and signs involving the circulatory and respiratory systems: Secondary | ICD-10-CM

## 2020-11-19 DIAGNOSIS — N179 Acute kidney failure, unspecified: Secondary | ICD-10-CM | POA: Diagnosis not present

## 2020-11-19 DIAGNOSIS — R4182 Altered mental status, unspecified: Secondary | ICD-10-CM | POA: Diagnosis not present

## 2020-11-19 NOTE — Progress Notes (Signed)
Received in report from [place] that the patient's ICD was deactivated last night by the [employer].

## 2020-11-19 NOTE — TOC Initial Note (Signed)
Transition of Care [facility]) - Initial/Assessment Note    Patient Details  Name: [name] MRN: [id number] Date of Birth: 03/01/1956  Transition of Care [facility]) CM/SW Contact:    Benard Halsted, LCSW Phone Number: 11/19/2020, 10:04 AM  Clinical Narrative:                 CSW received request for residential hospice placement, with family preference for [facility]. CSW sent referral to [facility] for review; they do not have beds available today.  Expected Discharge Plan: [place] Barriers to Discharge: Hospice Bed not available   Patient Goals and CMS Choice Patient states their goals for this hospitalization and ongoing recovery are:: comfort CMS Medicare.gov Compare Post Acute Care list provided to:: Patient Represenative (must comment) Choice offered to / list presented to : Adult Children  Expected Discharge Plan and Services Expected Discharge Plan: [place] In-house Referral: Clinical Social Work,Hospice / [place] Acute Care Choice: Hospice Living arrangements for the past 2 months: [place]: Hospice and [place] Date [place]: 11/19/20 Time [place]: 1003 Representative spoke with at [place]: [place]  Prior Living Arrangements/Services Living arrangements for the past 2 months: [place] Lives with:: Facility Resident Patient language and need for interpreter reviewed:: Yes Do you feel safe going back to the place where you live?: No      Need for Family Participation in Patient Care: Yes (Comment) Care giver support system in place?: Yes (comment)   Criminal Activity/Legal Involvement Pertinent to Current Situation/Hospitalization: No - Comment as needed  Activities of Daily Living Home Assistive Devices/Equipment: Blood pressure cuff,Hospital bed,Wheelchair ADL Screening (condition at time of  admission) Patient's cognitive ability adequate to safely complete daily activities?: No Is the patient deaf or have difficulty hearing?: Yes Does the patient have difficulty seeing, even when wearing glasses/contacts?: Yes Does the patient have difficulty concentrating, remembering, or making decisions?: Yes Patient able to express need for assistance with ADLs?: No Does the patient have difficulty dressing or bathing?: Yes Independently performs ADLs?: No Communication: Needs assistance Dressing (OT): Dependent Grooming: Dependent Feeding: Dependent Bathing: Dependent Toileting: Dependent In/Out Bed: Dependent Walks in Home: Dependent Does the patient have difficulty walking or climbing stairs?: Yes Weakness of Legs: Both Weakness of Arms/Hands: Both  Permission Sought/Granted Permission sought to share information with : Facility Contact Representative,Family Supports Permission granted to share information with : No  Share Information with NAME: [name]  Permission granted to share info w AGENCY: Hospice  Permission granted to share info w Relationship: Son  Permission granted to share info w Contact Information: [phone]  Emotional Assessment Appearance:: Appears older than stated age Attitude/Demeanor/Rapport: Unable to Assess Affect (typically observed): Unable to Assess Orientation: :  (Responds to voice only) Alcohol / Substance Use: Not Applicable Psych Involvement: No (comment)  Admission diagnosis:  Hypernatremia [E87.0] Urinary tract infection without hematuria, site unspecified [N39.0] Altered mental status, unspecified altered mental status type [W58.09] Acute metabolic encephalopathy [X83.38] COVID-19 [U07.1] Patient Active Problem List   Diagnosis Date Noted  . Hypernatremia 11/17/2020  . Acute metabolic encephalopathy 25/01/3975  . Malnutrition of moderate degree 04/15/2020  . Hypercalcemia 04/14/2020  . Dehydration, severe 04/13/2020  .  Dehydration 04/13/2020  . Acute hypernatremia   .  Failure to thrive in adult   . Closed left hip fracture, initial encounter ([place]) 12/30/2019  . Frequent falls   . Orthostatic hypotension 12/23/2019  . Syncopal episodes 12/22/2019  . Dementia due to Parkinson's disease without behavioral disturbance ([place]) 12/22/2019  . Renal insufficiency 12/22/2019  . Near syncope 09/04/2019  . AKI (acute kidney injury) ([place]) 09/04/2019  . Hypokalemia 09/04/2019  . Depression 09/04/2019  . Dementia ([place]) 09/04/2019  . Thrombocytopenia ([place]) 09/04/2019  . Low back pain 06/25/2019  . S/P AVR (aortic valve replacement) 12/12/2016  . Aortic stenosis 11/16/2016  . Severe aortic stenosis   . Polycythemia 06/06/2016  . Menopausal state 02/16/2016  . Hyperparathyroidism, primary ([place]) 02/16/2016  . Chronic diastolic CHF (congestive heart failure) ([place]) 05/07/2014  . Hx of colonic polyps 06/17/2013  . Ventricular tachycardia ([place]) 02/21/2011  . Automatic implantable cardioverter-defibrillator in situ 06/02/2009  . Hyperlipidemia 05/29/2009  . SMOKER 05/29/2009  . Essential hypertension 05/29/2009  . Aortic valve disorder 05/29/2009  . CARDIOMYOPATHY, SECONDARY 05/29/2009  . CHF 05/29/2009  . BICUSPID AORTIC VALVE 05/29/2009  . LIVER FUNCTION TESTS, ABNORMAL, HX OF 05/29/2009  . CHOLECYSTITIS, HX OF 05/29/2009   PCP:  Lujean Amel, MD Pharmacy:   [facility] [place] [address] Phone: [phone] Fax: [phone]     Social Determinants of Health (SDOH) Interventions    Readmission Risk Interventions Readmission Risk Prevention Plan 01/02/2020 12/25/2019  Transportation Screening Complete Complete  PCP or Specialist Appt within 5-7 Days Complete Complete  Home Care Screening Complete Complete  Medication Review (RN CM) Complete Complete  Some recent data might be hidden

## 2020-11-19 NOTE — Progress Notes (Addendum)
Manufacturing engineer [facility]) Hospital Liaison note.    Addendum:  ACC MD has reviewed chart; pt has been deemed eligible for inpatient hospice at [facility]  [place] liaisons will continue to follow for bed availability.  Received request from [place] for family interest in [facility]. Chart and pt information under review by [facility] physician.  Hospice eligibility pending at this time.  [facility] is unable to offer a room today. Hospital Liaison will follow up tomorrow or sooner if a room becomes available. Please do not hesitate to call with questions.    Thank you for the opportunity to participate in this patient's care.  Domenic Moras, BSN, RN [facility] Hospital Liaison (listed on [place] under Hospice/[facility])    [phone]  (24h on call)

## 2020-11-19 NOTE — Progress Notes (Signed)
Daily Progress Note   Patient Name: [name]       Date: 11/19/2020 DOB: Apr 13, 1956  Age: 65 y.o. MRN#: [id number] Attending Physician: Elodia Florence., * Primary Care Physician: Lujean Amel, MD Admit Date: 11/16/2020  Reason for Consultation/Follow-up: Non pain symptom management, Pain control, Psychosocial/spiritual support and Terminal Care  Subjective: Chart review performed. Received report from primary RN - no acute concerns. RN reports patient is not eating or drinking, remains lethargic.  Went to visit patient at bedside - brother/Barron and DIL/Barbara were present. Patient was lying in bed - she is asleep and does not wake to voice or gentle touch. No signs or non-verbal gestures of pain or discomfort noted. No respiratory distress or increased work of breathing; mild secretions noted.   Emotional support and therapeutic listening provided to family as they reflected on happy memories of themselves and the patient. Family state patient's son is on his way to room and ask if I would wait for his arrival. Family state he is having a hard time.  Son/Christopher arrived - emotional support and therapeutic listening provided; questions answered.   I provided "Gone from My Sight" book. Family were appreciative.  All questions and concerns addressed. Encouraged to call with questions and/or concerns. PMT card previously provided.  Asked RN to administer dose of robinul now for secretions and a second dose in 4hrs, then continue to monitor and provide additional doses as needed.    Length of Stay: 2  Current Medications: Scheduled Meds:  . LORazepam  1 mg Intravenous Q4H  . mouth rinse  15 mL Mouth Rinse BID  . sodium chloride flush  3 mL Intravenous Q12H     Continuous Infusions:   PRN Meds: acetaminophen **OR** acetaminophen, albuterol, antiseptic oral rinse, bisacodyl, glycopyrrolate **OR** glycopyrrolate, hydroxypropyl methylcellulose / hypromellose, morphine injection, ondansetron **OR** ondansetron (ZOFRAN) IV, polyvinyl alcohol  Physical Exam Vitals and nursing note reviewed.  Constitutional:      General: She is not in acute distress.    Appearance: She is ill-appearing.  Pulmonary:     Effort: No respiratory distress.  Skin:    General: Skin is warm and dry.  Neurological:     Mental Status: She is unresponsive.     Motor: Weakness present.  Psychiatric:  Speech: She is noncommunicative.             Vital Signs: BP (!) 136/99 (BP Location: Left Arm)   Pulse 73   Temp (!) 97.3 F (36.3 C) (Axillary)   Resp 17   Ht 5' (1.524 m)   Wt 40.2 kg   SpO2 96%   BMI 17.31 kg/m  SpO2: SpO2: 96 % O2 Device: O2 Device: Room Air O2 Flow Rate: O2 Flow Rate (L/min): 5 L/min  Intake/output summary:   Intake/Output Summary (Last 24 hours) at 11/19/2020 1551 Last data filed at 11/19/2020 0800 Gross per 24 hour  Intake 123 ml  Output 1000 ml  Net -877 ml   LBM: Last BM Date: 11/18/20 Baseline Weight: Weight: 43 kg Most recent weight: Weight: 40.2 kg       Palliative Assessment/Data: PPS 10%      Patient Active Problem List   Diagnosis Date Noted  . Hypernatremia 11/17/2020  . Acute metabolic encephalopathy 24/26/8341  . Malnutrition of moderate degree 04/15/2020  . Hypercalcemia 04/14/2020  . Dehydration, severe 04/13/2020  . Dehydration 04/13/2020  . Acute hypernatremia   . Failure to thrive in adult   . Closed left hip fracture, initial encounter ([place]) 12/30/2019  . Frequent falls   . Orthostatic hypotension 12/23/2019  . Syncopal episodes 12/22/2019  . Dementia due to Parkinson's disease without behavioral disturbance ([place]) 12/22/2019  . Renal insufficiency 12/22/2019  . Near syncope 09/04/2019  .  AKI (acute kidney injury) ([place]) 09/04/2019  . Hypokalemia 09/04/2019  . Depression 09/04/2019  . Dementia ([place]) 09/04/2019  . Thrombocytopenia ([place]) 09/04/2019  . Low back pain 06/25/2019  . S/P AVR (aortic valve replacement) 12/12/2016  . Aortic stenosis 11/16/2016  . Severe aortic stenosis   . Polycythemia 06/06/2016  . Menopausal state 02/16/2016  . Hyperparathyroidism, primary ([place]) 02/16/2016  . Chronic diastolic CHF (congestive heart failure) ([place]) 05/07/2014  . Hx of colonic polyps 06/17/2013  . Ventricular tachycardia ([place]) 02/21/2011  . Automatic implantable cardioverter-defibrillator in situ 06/02/2009  . Hyperlipidemia 05/29/2009  . SMOKER 05/29/2009  . Essential hypertension 05/29/2009  . Aortic valve disorder 05/29/2009  . CARDIOMYOPATHY, SECONDARY 05/29/2009  . CHF 05/29/2009  . BICUSPID AORTIC VALVE 05/29/2009  . LIVER FUNCTION TESTS, ABNORMAL, HX OF 05/29/2009  . CHOLECYSTITIS, HX OF 05/29/2009    Palliative Care Assessment & Plan   Patient Profile: 65 y.o.femalewith past medical history of Parkinson's dementia, hypertension, hyperlipidemia, AICD placement, CAD, and chronic systolic CHF who presented to the emergency departmenton 2/22/2022with altered mental status.Per intake H&P, patient is verbal with 1-2 words and is frail and cachectic. Family reports she had a hip fracture about a year ago and has been non-ambulatory since. She was COVID positive 2 weeks ago.  ED Course:Reportedly more confused this morning, possible aspiration event. Febrile to 101.5, urinalysis suspicious for UTI but no evidence of sepsis. Sodium 163. Given IV fluid and tylenol. Family considering change in code status. Neurology recommending EEG.   Assessment: Acute metabolic encephalopathy Hypernatremia Dehydration AKI Dysphagia Advanced Parkinson's dementia Recent COVID19 infection  Recommendations/Plan:  Continue full comfort measures - likely days  Continue DNR/DNI as  previously documented  Transfer to [facility] place when bed is available  Patient appears comfortable - continue current medication regimen for EOL comfort   RN to administer dose of robinul now and second dose in 4 hours for secretions - provide additional doses as needed  Nursing to administer PRN medications as clinically necessary to ensure EOL comfort  PMT will continue to follow and support holistically  Goals of Care and Additional Recommendations:  Limitations on Scope of Treatment: Full Comfort Care  Code Status:    Code Status Orders  (From admission, onward)         Start     Ordered   11/18/20 1847  Do not attempt resuscitation (DNR)  Continuous       Question Answer Comment  In the event of cardiac or respiratory ARREST Do not call a "code blue"   In the event of cardiac or respiratory ARREST Do not perform Intubation, CPR, defibrillation or ACLS   In the event of cardiac or respiratory ARREST Use medication by any route, position, wound care, and other measures to relive pain and suffering. May use oxygen, suction and manual treatment of airway obstruction as needed for comfort.      11/18/20 1850        Code Status History    Date Active Date Inactive Code Status Order ID Comments User Context   11/18/2020 1643 11/18/2020 1850 DNR [id number]  Elodia Florence., MD Inpatient   11/16/2020 1704 11/18/2020 1642 Full Code [id number]  Karmen Bongo, MD ED   04/13/2020 1839 05/29/2020 0006 Full Code [id number]  Lequita Halt, MD ED   12/30/2019 0739 01/02/2020 2332 Full Code [id number]  Vianne Bulls, MD Inpatient   12/22/2019 1504 12/26/2019 2340 Full Code [id number]  Norval Morton, MD ED   09/04/2019 2342 09/05/2019 2141 Full Code [id number]  Lenore Cordia, MD ED   11/16/2016 1245 11/22/2016 1543 Full Code [id number]  Nani Skillern, PA-C Inpatient   07/05/2016 1140 07/05/2016 1744 Full Code [id number]  Burnell Blanks, MD Inpatient   Advance Care Planning  Activity       Prognosis:   Hours - Days  Discharge Planning:  Hospice facility, possibly in hospital death  Care plan was discussed with primary RN, patient's family  Thank you for allowing the Palliative Medicine Team to assist in the care of this patient.   Total Time 25 minutes Prolonged Time Billed  no       Greater than 50%  of this time was spent counseling and coordinating care related to the above assessment and plan.  Lin Landsman, NP  Please contact Palliative Medicine Team phone at [phone] for questions and concerns.

## 2020-11-19 NOTE — Progress Notes (Signed)
This chaplain responded to PMT consult for EOL spiritual care.  The chaplain understands the Pt. Is waiting on a Hospice room. The chaplain phoned the Pt. RN-Amber before the visit.  The chaplain did not observe any distress during the visit.  Family are not at the bedside.  The chaplain understands the Pt. is of the [place] tradition.  An introduction, moment of silence and prayer was shared with the Pt. along with sacred scripture.    This chaplain is available for F/U spiritual care as needed.

## 2020-11-19 NOTE — Progress Notes (Signed)
Nutrition Brief Note  Chart reviewed. Pt now transitioning to comfort care.  No further nutrition interventions warranted at this time.  Please re-consult as needed.   Corrin Parker, MS, RD, LDN RD pager number/after hours weekend pager number on Amion.

## 2020-11-19 NOTE — Progress Notes (Addendum)
PROGRESS NOTE    [name]  RKY:[id number] DOB: 1956/02/17 DOA: 11/16/2020 PCP: Lujean Amel, MD   Chief Complaint  Patient presents with  . Altered Mental Status    Brief Narrative:  [name] is [name] 65 y.o. female with medical history significant of Parkinson's dementia; HLD; HTN; AICD placement; CAD; and chronic systolic CHF presenting with AMS.  Her son and her brother were present throughout the evaluation.  The patient is verbal with 1-2 words and is frail and cachectic.  Family reports that she had [name] hip fracture about [name] year ago and has been non-ambulatory since.  She has been failing since with increasing difficulties with feeding, speaking.  She was last able to feed herself [name] month or more ago and was able to have [name] limited conversation about 2 weeks ago.  Her brother believes that she would not want to live this way and certainly would not desire resuscitation but her son is not ready to make this decision.  ED Course:  Progressive dementia, cachexia.  More confused this AM, ?aspiration event.  Temp 101.5, looks like UTI but no evidence of sepsis.  Na+ 163.  Given IVF and tylenol, appears to be improving.  Considering change in code status.  Neurology recommends EEG but no plan to consult at this time.  COVID + 2 weeks ago.  Assessment & Plan:   Principal Problem:   Acute metabolic encephalopathy Active Problems:   Hyperlipidemia   Essential hypertension   Automatic implantable cardioverter-defibrillator in situ   Chronic diastolic CHF (congestive heart failure) (HCC)   AKI (acute kidney injury) ([name])   Dementia due to Parkinson's disease without behavioral disturbance (HCC)   Hypernatremia  Goals of care: transitioned to comfort measures after discussion with palliative. Continue comfort meds as indicated Suspect she may pass in hospital  Acute metabolic encephalopathy -Patient presenting with encephalopathy as evidenced by her  obtunded/unresponsiveness -not responding to voice today -suspected 2/2 UTI in setting of her dementia +/- hypernatremia -Will observe for now with IVF and abx -EEG with mild diffuse encephalopathy, nonspecific -head CT without acute abnormality  -unable to obtain MRI due to AICD -TSH wnl.  B12 and folate wnl. Ammonia wnl.  -she's been transitioned to comfort measures  Hypernatremia due to dehydration, with AKI  Hypotension Comfort measures  Dysphagia: SLP saw 2/24, recommended dysphagia 1, thin liquids via teaspoon/edge of cup, crushed meds with puree. Transitioned to comfort  Pyuria  Concern for UTI  SIRS Follow blood (ng) and urine cultures (pending) Continue cefepime  CXR without acute disease Tachypnea with leukocytosis, concern for UTI  Advanced Parkinson's dementia -She appears to have advanced dementia -Given concern for AMS, she does not appear to be able to swallow without aspiration risk at this time -resume sinemet, aricept, depakote for now (continue to hold zoloft for now) -Also with palliative care consultation - comfort  Malnutrition -Body mass index is 18.51 kg/m..  -The patient has at least 2 indicators for malnutrition (insufficient energy intake, weight loss, loss of muscle mass, loss of subcutaneous fat diminished functional status).  -This is likely due to starvation related/chronic disease -Will obtain [facility] nutrition consult for further recommendations.  Recent COVID infection -[name] faxed notice that pt tested positive 10/08/20.  She's out of quarantine.   -positive on 2/22, but this is within 90 day window.  Can continue to follow off precautions.  HTN -continue to hold BP meds (bp appropriate) - hold amlodipine/coreg  HLD -Hold Crestor for now  CAD/chronic systolic CHF with AICD -Hold ASA given concern for aspiration risk -Most recent echo in 08/2019 with EF 55-60% and grade 1 diastolic dysfunction -She may be at greater risk for  volume overload in this circumstance and needs to be closely monitored  Thrombocytopenia -Hold AC and place SCDs -likely 2/2 acute infection (UTI) -Will follow smear  Concern for afib She's on telemetry  EKG this AM with concern for atrial fib (I think this is more likely related to fine tremor that she has).  Will continue to monitor on tele, consider repeat ekg and/or discussion with cardiology.  She's not appropriate for anticoagulation at this time with thrombocytopenia. EKG this AM with paced rhythm, follow   DVT prophylaxis: SCD's with thrombocytopenia Code Status: full  Family Communication: none at bedside (discussed with son 2/25) Disposition:   Status is: Observation  The patient will require care spanning > 2 midnights and should be moved to inpatient because: Inpatient level of care appropriate due to severity of illness  Dispo: The patient is from: SNF              Anticipated d/c is to: SNF              Anticipated d/c date is: > 3 days              Patient currently is not medically stable to d/c.   Difficult to place patient No       Consultants:  none Procedures:  EEG  IMPRESSION: This study is suggestive of mild diffuse encephalopathy, nonspecific etiology. No seizures or epileptiform discharges were seen throughout the recording.  Per tech annotation patient was noted to have left arm twitching which was not visible on camera without concomitant eeg change and was most likely not epileptic   Antimicrobials:  Anti-infectives (From admission, onward)   Start     Dose/Rate Route Frequency Ordered Stop   11/18/20 1000  ceFEPIme (MAXIPIME) 2 g in sodium chloride 0.9 % 100 mL IVPB  Status:  Discontinued        2 g 200 mL/hr over 30 Minutes Intravenous Every 12 hours 11/18/20 0907 11/18/20 1846   11/17/20 1400  vancomycin (VANCOREADY) IVPB 500 mg/100 mL  Status:  Discontinued        500 mg 100 mL/hr over 60 Minutes Intravenous Every 24 hours 11/16/20  1444 11/16/20 1704   11/17/20 1300  ceFEPIme (MAXIPIME) 2 g in sodium chloride 0.9 % 100 mL IVPB  Status:  Discontinued        2 g 200 mL/hr over 30 Minutes Intravenous Every 24 hours 11/16/20 1711 11/18/20 0907   11/17/20 0200  ceFEPIme (MAXIPIME) 2 g in sodium chloride 0.9 % 100 mL IVPB  Status:  Discontinued        2 g 200 mL/hr over 30 Minutes Intravenous Every 12 hours 11/16/20 1444 11/16/20 1711   11/16/20 1345  vancomycin (VANCOREADY) IVPB 1000 mg/200 mL        1,000 mg 200 mL/hr over 60 Minutes Intravenous  Once 11/16/20 1330 11/16/20 1615   11/16/20 1345  ceFEPIme (MAXIPIME) 2 g in sodium chloride 0.9 % 100 mL IVPB        2 g 200 mL/hr over 30 Minutes Intravenous  Once 11/16/20 1331 11/16/20 1433         Subjective: Unresponsive to voice  Objective: Vitals:   11/18/20 2315 11/19/20 0402 11/19/20 0746 11/19/20 1500  BP:    (!) 136/99  Pulse: 66  69  73  Resp:    17  Temp:    (!) 97.3 F (36.3 C)  TempSrc:    Axillary  SpO2: 96% 97% 97% 96%  Weight:      Height:        Intake/Output Summary (Last 24 hours) at 11/19/2020 1625 Last data filed at 11/19/2020 0800 Gross per 24 hour  Intake 123 ml  Output 775 ml  Net -652 ml   Filed Weights   11/16/20 1318 11/17/20 1415  Weight: 43 kg 40.2 kg    Examination:  General: obtuneded Lungs: steady deep respirations Neurological: unresponsive to voice Skin: Warm and dry. No rashes or lesions. Extremities: No clubbing or cyanosis. No edema.    Data Reviewed: I have personally reviewed following labs and imaging studies  CBC: Recent Labs  Lab 11/16/20 1312 11/17/20 0258 11/18/20 0615  WBC 8.9 11.0* 6.3  NEUTROABS 5.9  --  3.7  HGB 14.2 13.6 10.6*  HCT 46.6* 42.2 32.5*  MCV 92.1 91.9 89.0  PLT 86* 65* 67*    Basic Metabolic Panel: Recent Labs  Lab 11/16/20 2030 11/17/20 0258 11/17/20 1417 11/17/20 2154 11/18/20 0615  NA 162* 161* 160* 153* 147*  K 3.5 4.3 3.2* 2.9* 2.7*  CL 129* 128* 124* 122*  118*  CO2 23 21* 25 20* 20*  GLUCOSE 117* 110* 143* 159* 164*  BUN 42* 41* 35* 34* 26*  CREATININE 0.99 0.74 0.79 0.57 0.62  CALCIUM 10.5* 10.3 10.4* 9.7 9.7  MG  --   --   --   --  2.1  PHOS  --   --   --   --  1.7*    GFR: Estimated Creatinine Clearance: 43.9 mL/min (by C-G formula based on SCr of 0.62 mg/dL).  Liver Function Tests: Recent Labs  Lab 11/16/20 1312 11/18/20 0615  AST 52* 31  ALT 49* 48*  ALKPHOS 63 48  BILITOT 0.9 0.6  PROT 8.3* 6.2*  ALBUMIN 3.7 2.8*    CBG: Recent Labs  Lab 11/16/20 1314  GLUCAP 114*     Recent Results (from the past 240 hour(s))  Resp Panel by RT-PCR (Flu Cortney Mckinney&B, Covid) Nasopharyngeal Swab     Status: Abnormal   Collection Time: 11/16/20  1:12 PM   Specimen: Nasopharyngeal Swab; Nasopharyngeal(NP) swabs in vial transport medium  Result Value Ref Range Status   SARS Coronavirus 2 by RT PCR POSITIVE (Latrel Szymczak) NEGATIVE Final    Comment: RESULT CALLED TO, READ BACK BY AND VERIFIED WITH: RN M.EINIK AT 1523 ON 11/16/2020 BY T.SAAD (NOTE) SARS-CoV-2 target nucleic acids are DETECTED.  The SARS-CoV-2 RNA is generally detectable in upper respiratory specimens during the acute phase of infection. Positive results are indicative of the presence of the identified virus, but do not rule out bacterial infection or co-infection with other pathogens not detected by the test. Clinical correlation with patient history and other diagnostic information is necessary to determine patient infection status. The expected result is Negative.  Fact Sheet for Patients: EntrepreneurPulse.com.au  Fact Sheet for Healthcare Providers: IncredibleEmployment.be  This test is not yet approved or cleared by the [place] FDA and  has been authorized for detection and/or diagnosis of SARS-CoV-2 by FDA under an Emergency Use Authorization (EUA).  This EUA will remain in effect (meaning this te st can be used) for the duration  of  the COVID-19 declaration under Section 564(b)(1) of the Act, 21 U.S.C. section 360bbb-3(b)(1), unless the authorization is terminated or revoked sooner.  Influenza Tityana Pagan by PCR NEGATIVE NEGATIVE Final   Influenza B by PCR NEGATIVE NEGATIVE Final    Comment: (NOTE) The Xpert Xpress SARS-CoV-2/FLU/RSV plus assay is intended as an aid in the diagnosis of influenza from Nasopharyngeal swab specimens and should not be used as [place]in Kettering sole basis for treatment. Nasal washings and aspirates are unacceptable for Xpert Xpress SARS-CoV-2/FLU/RSV testing.  Fact Sheet for Patients: EntrepreneurPulse.com.au  Fact Sheet for Healthcare Providers: IncredibleEmployment.be  This test is not yet approved or cleared by the [place] FDA and has been authorized for detection and/or diagnosis of SARS-CoV-2 by FDA under an Emergency Use Authorization (EUA). This EUA will remain in effect (meaning this test can be used) for the duration of the COVID-19 declaration under Section 564(b)(1) of the Act, 21 U.S.C. section 360bbb-3(b)(1), unless the authorization is terminated or revoked.  Performed at [facility], [place] [address] [place], [place] [address]   Blood Culture (routine x 2)     Status: None (Preliminary result)   Collection Time: 11/16/20  1:12 PM   Specimen: BLOOD RIGHT FOREARM  Result Value Ref Range Status   Specimen Description BLOOD RIGHT FOREARM  Final   Special Requests   Final    BOTTLES DRAWN AEROBIC AND ANAEROBIC Blood Culture adequate volume   Culture   Final    NO GROWTH 3 DAYS Performed at [facility], [place] [address], [place], [place] [address]    Report Status PENDING  Incomplete  Urine culture     Status: Abnormal   Collection Time: 11/16/20  1:12 PM   Specimen: In/Out Cath Urine  Result Value Ref Range Status   Specimen Description IN/OUT CATH URINE  Final   Special Requests   Final    NONE Performed at [facility], [place] [name] [place], [place] [address]    Culture MULTIPLE SPECIES PRESENT, SUGGEST RECOLLECTION (Marri Mcneff)  Final   Report Status 11/18/2020 FINAL  Final  Blood Culture (routine x 2)     Status: None (Preliminary result)   Collection Time: 11/16/20  1:17 PM   Specimen: BLOOD RIGHT WRIST  Result Value Ref Range Status   Specimen Description BLOOD RIGHT WRIST  Final   Special Requests   Final    BOTTLES DRAWN AEROBIC AND ANAEROBIC Blood Culture adequate volume   Culture   Final    NO GROWTH 3 DAYS Performed at [facility], [place] [address], [place], [place] [address]    Report Status PENDING  Incomplete  MRSA PCR Screening     Status: None   Collection Time: 11/17/20 11:39 PM   Specimen: Nasal Mucosa; Nasopharyngeal  Result Value Ref Range Status   MRSA by PCR NEGATIVE NEGATIVE Final    Comment:        The GeneXpert MRSA Assay (FDA approved for NASAL specimens only), is one component of Tyden Kann comprehensive MRSA colonization surveillance program. It is not intended to diagnose MRSA infection nor to guide or monitor treatment for MRSA infections. Performed at [facility], [place] [address] [place], [place] [address]          Radiology Studies: DG CHEST PORT 1 VIEW  Result Date: 11/18/2020 CLINICAL DATA:  Shortness of breath EXAM: PORTABLE CHEST 1 VIEW COMPARISON:  Portable exam 1626 hours compared to 11/16/2020 FINDINGS: LEFT subclavian ICD with leads projecting over RIGHT atrium, RIGHT ventricle, and coronary sinus. Normal heart size post AVR. Mediastinal contours and pulmonary vascularity normal. Atherosclerotic calcification  aorta. LEFT lower lobe infiltrate consistent with pneumonia. Remaining lungs clear. No pleural effusion or pneumothorax. IMPRESSION: LEFT lower lobe infiltrate consistent with pneumonia. Post ICD and AVR. Aortic Atherosclerosis (ICD10-I70.0). Electronically Signed   By: Lavonia Dana M.D.   On: 11/18/2020 16:43        Scheduled Meds: .  LORazepam  1 mg Intravenous Q4H  . mouth rinse  15 mL Mouth Rinse BID  . sodium chloride flush  3 mL Intravenous Q12H   Continuous Infusions:    LOS: 2 days    Time spent: over 30 min    Fayrene Helper, MD [facility]   To contact the attending provider between 7A-7P or the covering provider during after hours 7P-7A, please log into the web site [email or web address] and access using universal [place] password for that web site. If you do not have the password, please call the hospital operator.  11/19/2020, 4:25 PM

## 2020-11-20 DIAGNOSIS — N179 Acute kidney failure, unspecified: Secondary | ICD-10-CM | POA: Diagnosis not present

## 2020-11-20 DIAGNOSIS — I5032 Chronic diastolic (congestive) heart failure: Secondary | ICD-10-CM | POA: Diagnosis not present

## 2020-11-20 DIAGNOSIS — Z95810 Presence of automatic (implantable) cardiac defibrillator: Secondary | ICD-10-CM | POA: Diagnosis not present

## 2020-11-20 DIAGNOSIS — G9341 Metabolic encephalopathy: Secondary | ICD-10-CM | POA: Diagnosis not present

## 2020-11-20 MED ORDER — LORAZEPAM 2 MG/ML IJ SOLN: 1.0000 mg | INTRAMUSCULAR | Status: DC | PRN

## 2020-11-20 NOTE — TOC Progression Note (Signed)
Transition of Care [facility]) - Progression Note    Patient Details  Name: [name] MRN: [id number] Date of Birth: 04-May-1956  Transition of Care [facility]) CM/SW Contact  Talitha Dicarlo [place], [place] Phone Number:[phone] 11/20/2020, 4:38 PM  Clinical Narrative:    Phone call to [place] for bed availability. [phone] Voicemail left for a return call.   Sheralyn Boatman [facility] Care Management [phone]    Expected Discharge Plan: [place] Barriers to Discharge: Hospice Bed not available  Expected Discharge Plan and Services Expected Discharge Plan: [place] In-house Referral: Clinical Social Work,Hospice / [place] Acute Care Choice: Hospice Living arrangements for the past 2 months: [place]: Hospice and [place] Date [place]: 11/19/20 Time [place]: 1003 Representative spoke with at [place]: [place] ([place]) Interventions    Readmission Risk Interventions Readmission Risk Prevention Plan 01/02/2020 12/25/2019  Transportation Screening Complete Complete  PCP or Specialist Appt within 5-7 Days Complete Complete  Home Care Screening Complete Complete  Medication Review (RN CM) Complete Complete  Some recent data might be hidden

## 2020-11-20 NOTE — Progress Notes (Signed)
PROGRESS NOTE    [name]  BOF:[id number] DOB: Aug 11, 1956 DOA: 11/16/2020 PCP: Lujean Amel, MD   Chief Complaint  Patient presents with  . Altered Mental Status    Brief Narrative:  [name] is [name] 65 y.o. female with medical history significant of Parkinson's dementia; HLD; HTN; AICD placement; CAD; and chronic systolic CHF presenting with AMS.  Her son and her brother were present throughout the evaluation.  The patient is verbal with 1-2 words and is frail and cachectic.  Family reports that she had [name] hip fracture about [name] year ago and has been non-ambulatory since.  She has been failing since with increasing difficulties with feeding, speaking.  She was last able to feed herself [name] month or more ago and was able to have [name] limited conversation about 2 weeks ago.  Her brother believes that she would not want to live this way and certainly would not desire resuscitation but her son is not ready to make this decision.  ED Course:  Progressive dementia, cachexia.  More confused this AM, ?aspiration event.  Temp 101.5, looks like UTI but no evidence of sepsis.  Na+ 163.  Given IVF and tylenol, appears to be improving.  Considering change in code status.  Neurology recommends EEG but no plan to consult at this time.  COVID + 2 weeks ago.  Assessment & Plan:   Principal Problem:   Acute metabolic encephalopathy Active Problems:   Hyperlipidemia   Essential hypertension   Automatic implantable cardioverter-defibrillator in situ   Chronic diastolic CHF (congestive heart failure) (HCC)   AKI (acute kidney injury) ([place])   Dementia due to Parkinson's disease without behavioral disturbance (HCC)   Hypernatremia  Goals of care: transitioned to comfort measures after discussion with palliative. Continue comfort meds as indicated To inpatient hospice when bed available pending stability  Acute metabolic encephalopathy -Patient presenting with encephalopathy as evidenced by her  obtunded/unresponsiveness -not responding to voice today -suspected 2/2 UTI in setting of her dementia +/- hypernatremia -Will observe for now with IVF and abx -EEG with mild diffuse encephalopathy, nonspecific -head CT without acute abnormality  -unable to obtain MRI due to AICD -TSH wnl.  B12 and folate wnl. Ammonia wnl.  -she's been transitioned to comfort measures  Hypernatremia due to dehydration, with AKI  Hypotension Comfort measures  Dysphagia: SLP saw 2/24, recommended dysphagia 1, thin liquids via teaspoon/edge of cup, crushed meds with puree. Transitioned to comfort  Pyuria  Concern for UTI  SIRS Follow blood (ng) and urine cultures (pending) Continue cefepime  CXR without acute disease Tachypnea with leukocytosis, concern for UTI  Advanced Parkinson's dementia -She appears to have advanced dementia -Given concern for AMS, she does not appear to be able to swallow without aspiration risk at this time -resume sinemet, aricept, depakote for now (continue to hold zoloft for now) -Also with palliative care consultation - comfort  Malnutrition -Body mass index is 18.51 kg/m..  -The patient has at least 2 indicators for malnutrition (insufficient energy intake, weight loss, loss of muscle mass, loss of subcutaneous fat diminished functional status).  -This is likely due to starvation related/chronic disease -Will obtain [name] nutrition consult for further recommendations.  Recent COVID infection -[name] faxed notice that pt tested positive 10/08/20.  She's out of quarantine.   -positive on 2/22, but this is within 90 day window.  Can continue to follow off precautions.  HTN -continue to hold BP meds (bp appropriate) - hold amlodipine/coreg  HLD -Hold Crestor  for now  CAD/chronic systolic CHF with AICD -Hold ASA given concern for aspiration risk -Most recent echo in 08/2019 with EF 55-60% and grade 1 diastolic dysfunction -She may be at greater risk for  volume overload in this circumstance and needs to be closely monitored  Thrombocytopenia -Hold AC and place SCDs -likely 2/2 acute infection (UTI) -Will follow smear  Concern for afib She's on telemetry  EKG this AM with concern for atrial fib (I think this is more likely related to fine tremor that she has).  Will continue to monitor on tele, consider repeat ekg and/or discussion with cardiology.  She's not appropriate for anticoagulation at this time with thrombocytopenia. EKG this AM with paced rhythm, follow   DVT prophylaxis: SCD's with thrombocytopenia Code Status: full  Family Communication: none at bedside (discussed with son 2/25) Disposition:   Status is: Observation  The patient will require care spanning > 2 midnights and should be moved to inpatient because: Inpatient level of care appropriate due to severity of illness  Dispo: The patient is from: SNF              Anticipated d/c is to: SNF              Anticipated d/c date is: > 3 days              Patient currently is not medically stable to d/c.   Difficult to place patient No       Consultants:  none Procedures:  EEG  IMPRESSION: This study is suggestive of mild diffuse encephalopathy, nonspecific etiology. No seizures or epileptiform discharges were seen throughout the recording.  Per tech annotation patient was noted to have left arm twitching which was not visible on camera without concomitant eeg change and was most likely not epileptic   Antimicrobials:  Anti-infectives (From admission, onward)   Start     Dose/Rate Route Frequency Ordered Stop   11/18/20 1000  ceFEPIme (MAXIPIME) 2 g in sodium chloride 0.9 % 100 mL IVPB  Status:  Discontinued        2 g 200 mL/hr over 30 Minutes Intravenous Every 12 hours 11/18/20 0907 11/18/20 1846   11/17/20 1400  vancomycin (VANCOREADY) IVPB 500 mg/100 mL  Status:  Discontinued        500 mg 100 mL/hr over 60 Minutes Intravenous Every 24 hours 11/16/20  1444 11/16/20 1704   11/17/20 1300  ceFEPIme (MAXIPIME) 2 g in sodium chloride 0.9 % 100 mL IVPB  Status:  Discontinued        2 g 200 mL/hr over 30 Minutes Intravenous Every 24 hours 11/16/20 1711 11/18/20 0907   11/17/20 0200  ceFEPIme (MAXIPIME) 2 g in sodium chloride 0.9 % 100 mL IVPB  Status:  Discontinued        2 g 200 mL/hr over 30 Minutes Intravenous Every 12 hours 11/16/20 1444 11/16/20 1711   11/16/20 1345  vancomycin (VANCOREADY) IVPB 1000 mg/200 mL        1,000 mg 200 mL/hr over 60 Minutes Intravenous  Once 11/16/20 1330 11/16/20 1615   11/16/20 1345  ceFEPIme (MAXIPIME) 2 g in sodium chloride 0.9 % 100 mL IVPB        2 g 200 mL/hr over 30 Minutes Intravenous  Once 11/16/20 1331 11/16/20 1433         Subjective: Minimally responsive to voice  Objective: Vitals:   11/19/20 0402 11/19/20 0746 11/19/20 1500 11/19/20 2343  BP:   (!) 136/99 Marland Kitchen)  111/94  Pulse: 69  73 80  Resp:   17 16  Temp:   (!) 97.3 F (36.3 C) (!) 97.5 F (36.4 C)  TempSrc:   Axillary Axillary  SpO2: 97% 97% 96% 98%  Weight:      Height:        Intake/Output Summary (Last 24 hours) at 11/20/2020 1723 Last data filed at 11/20/2020 0348 Gross per 24 hour  Intake --  Output 50 ml  Net -50 ml   Filed Weights   11/16/20 1318 11/17/20 1415  Weight: 43 kg 40.2 kg    Examination:  General: lethargic . Lungs: unlabored Neurological: does not follow commands, minimally responsive to voice Skin: Warm and dry. No rashes or lesions. Extremities: No clubbing or cyanosis. No edema.     Data Reviewed: I have personally reviewed following labs and imaging studies  CBC: Recent Labs  Lab 11/16/20 1312 11/17/20 0258 11/18/20 0615  WBC 8.9 11.0* 6.3  NEUTROABS 5.9  --  3.7  HGB 14.2 13.6 10.6*  HCT 46.6* 42.2 32.5*  MCV 92.1 91.9 89.0  PLT 86* 65* 67*    Basic Metabolic Panel: Recent Labs  Lab 11/16/20 2030 11/17/20 0258 11/17/20 1417 11/17/20 2154 11/18/20 0615  NA 162* 161* 160*  153* 147*  K 3.5 4.3 3.2* 2.9* 2.7*  CL 129* 128* 124* 122* 118*  CO2 23 21* 25 20* 20*  GLUCOSE 117* 110* 143* 159* 164*  BUN 42* 41* 35* 34* 26*  CREATININE 0.99 0.74 0.79 0.57 0.62  CALCIUM 10.5* 10.3 10.4* 9.7 9.7  MG  --   --   --   --  2.1  PHOS  --   --   --   --  1.7*    GFR: Estimated Creatinine Clearance: 43.9 mL/min (by C-G formula based on SCr of 0.62 mg/dL).  Liver Function Tests: Recent Labs  Lab 11/16/20 1312 11/18/20 0615  AST 52* 31  ALT 49* 48*  ALKPHOS 63 48  BILITOT 0.9 0.6  PROT 8.3* 6.2*  ALBUMIN 3.7 2.8*    CBG: Recent Labs  Lab 11/16/20 1314  GLUCAP 114*     Recent Results (from the past 240 hour(s))  Resp Panel by RT-PCR (Flu Cherika Jessie&B, Covid) Nasopharyngeal Swab     Status: Abnormal   Collection Time: 11/16/20  1:12 PM   Specimen: Nasopharyngeal Swab; Nasopharyngeal(NP) swabs in vial transport medium  Result Value Ref Range Status   SARS Coronavirus 2 by RT PCR POSITIVE (Deeann Servidio) NEGATIVE Final    Comment: RESULT CALLED TO, READ BACK BY AND VERIFIED WITH: RN M.EINIK AT 1523 ON 11/16/2020 BY T.SAAD (NOTE) SARS-CoV-2 target nucleic acids are DETECTED.  The SARS-CoV-2 RNA is generally detectable in upper respiratory specimens during the acute phase of infection. Positive results are indicative of the presence of the identified virus, but do not rule out bacterial infection or co-infection with other pathogens not detected by the test. Clinical correlation with patient history and other diagnostic information is necessary to determine patient infection status. The expected result is Negative.  Fact Sheet for Patients: EntrepreneurPulse.com.au  Fact Sheet for Healthcare Providers: IncredibleEmployment.be  This test is not yet approved or cleared by the [place] FDA and  has been authorized for detection and/or diagnosis of SARS-CoV-2 by FDA under an Emergency Use Authorization (EUA).  This EUA will remain  in effect (meaning this te st can be used) for the duration of  the COVID-19 declaration under Section 564(b)(1) of the Act, 21  U.S.C. section 360bbb-3(b)(1), unless the authorization is terminated or revoked sooner.     Influenza Samad Thon by PCR NEGATIVE NEGATIVE Final   Influenza B by PCR NEGATIVE NEGATIVE Final    Comment: (NOTE) The Xpert Xpress SARS-CoV-2/FLU/RSV plus assay is intended as an aid in the diagnosis of influenza from Nasopharyngeal swab specimens and should not be used as Kedron Uno sole basis for treatment. Nasal washings and aspirates are unacceptable for Xpert Xpress SARS-CoV-2/FLU/RSV testing.  Fact Sheet for Patients: EntrepreneurPulse.com.au  Fact Sheet for Healthcare Providers: IncredibleEmployment.be  This test is not yet approved or cleared by the [place] FDA and has been authorized for detection and/or diagnosis of SARS-CoV-2 by FDA under an Emergency Use Authorization (EUA). This EUA will remain in effect (meaning this test can be used) for the duration of the COVID-19 declaration under Section 564(b)(1) of the Act, 21 U.S.C. section 360bbb-3(b)(1), unless the authorization is terminated or revoked.  Performed at [facility], [place] [address] [place], [place] [address]   Blood Culture (routine x 2)     Status: None (Preliminary result)   Collection Time: 11/16/20  1:12 PM   Specimen: BLOOD RIGHT FOREARM  Result Value Ref Range Status   Specimen Description BLOOD RIGHT FOREARM  Final   Special Requests   Final    BOTTLES DRAWN AEROBIC AND ANAEROBIC Blood Culture adequate volume   Culture   Final    NO GROWTH 4 DAYS Performed at [facility], [place] [address] [place], [place] [address]    Report Status PENDING  Incomplete  Urine culture     Status: Abnormal   Collection Time: 11/16/20  1:12 PM   Specimen: In/Out Cath Urine  Result Value Ref Range Status   Specimen Description IN/OUT CATH URINE  Final    Special Requests   Final    NONE Performed at [facility], [place] [address], [place], [place] [address]    Culture MULTIPLE SPECIES PRESENT, SUGGEST RECOLLECTION (Mariamawit Depaoli)  Final   Report Status 11/18/2020 FINAL  Final  Blood Culture (routine x 2)     Status: None (Preliminary result)   Collection Time: 11/16/20  1:17 PM   Specimen: BLOOD RIGHT WRIST  Result Value Ref Range Status   Specimen Description BLOOD RIGHT WRIST  Final   Special Requests   Final    BOTTLES DRAWN AEROBIC AND ANAEROBIC Blood Culture adequate volume   Culture   Final    NO GROWTH 4 DAYS Performed at [facility], [place] [address], [place], [place] [address]    Report Status PENDING  Incomplete  MRSA PCR Screening     Status: None   Collection Time: 11/17/20 11:39 PM   Specimen: Nasal Mucosa; Nasopharyngeal  Result Value Ref Range Status   MRSA by PCR NEGATIVE NEGATIVE Final    Comment:        The GeneXpert MRSA Assay (FDA approved for NASAL specimens only), is one component of Lossie Kalp comprehensive MRSA colonization surveillance program. It is not intended to diagnose MRSA infection nor to guide or monitor treatment for MRSA infections. Performed at [facility], [place] [address], [place], [place] [address]          Radiology Studies: No results found.      Scheduled Meds: . mouth rinse  15 mL Mouth Rinse BID  . sodium chloride flush  3 mL Intravenous Q12H   Continuous Infusions:    LOS: 3 days  Time spent: over 30 min    Fayrene Helper, MD [facility]   To contact the attending provider between 7A-7P or the covering provider during after hours 7P-7A, please log into the web site [email or web address] and access using universal [place] password for that web site. If you do not have the password, please call the hospital operator.  11/20/2020, 5:23 PM

## 2020-11-20 NOTE — Plan of Care (Signed)
  Problem: Clinical Measurements: Goal: Cardiovascular complication will be avoided Outcome: Progressing   

## 2020-11-20 NOTE — Plan of Care (Signed)
  Problem: Education: Goal: Knowledge of General Education information will improve Description: Including pain rating scale, medication(s)/side effects and non-pharmacologic comfort measures Outcome: Progressing   Problem: Health Behavior/Discharge Planning: Goal: Ability to manage health-related needs will improve Outcome: Progressing   Problem: Clinical Measurements: Goal: Ability to maintain clinical measurements within normal limits will improve Outcome: Progressing Goal: Will remain free from infection Outcome: Progressing Goal: Diagnostic test results will improve Outcome: Progressing Goal: Respiratory complications will improve Outcome: Progressing Goal: Cardiovascular complication will be avoided Outcome: Progressing   Problem: Activity: Goal: Risk for activity intolerance will decrease Outcome: Progressing   Problem: Nutrition: Goal: Adequate nutrition will be maintained Outcome: Progressing   Problem: Coping: Goal: Level of anxiety will decrease Outcome: Progressing   Problem: Elimination: Goal: Will not experience complications related to bowel motility Outcome: Progressing Goal: Will not experience complications related to urinary retention Outcome: Progressing   Problem: Pain Managment: Goal: General experience of comfort will improve Outcome: Progressing   Problem: Safety: Goal: Ability to remain free from injury will improve Outcome: Progressing   Problem: Skin Integrity: Goal: Risk for impaired skin integrity will decrease Outcome: Progressing   Problem: Education: Goal: Ability to demonstrate management of disease process will improve Outcome: Progressing Goal: Ability to verbalize understanding of medication therapies will improve Outcome: Progressing Goal: Individualized Educational Video(s) Outcome: Progressing   Problem: Activity: Goal: Capacity to carry out activities will improve Outcome: Progressing   Problem: Cardiac: Goal:  Ability to achieve and maintain adequate cardiopulmonary perfusion will improve Outcome: Progressing   Problem: Education: Goal: Knowledge of the prescribed therapeutic regimen will improve Outcome: Progressing   Problem: Coping: Goal: Ability to identify and develop effective coping behavior will improve Outcome: Progressing   Problem: Clinical Measurements: Goal: Quality of life will improve Outcome: Progressing   Problem: Respiratory: Goal: Verbalizations of increased ease of respirations will increase Outcome: Progressing   Problem: Role Relationship: Goal: Family's ability to cope with current situation will improve Outcome: Progressing Goal: Ability to verbalize concerns, feelings, and thoughts to partner or family member will improve Outcome: Progressing   Problem: Pain Management: Goal: Satisfaction with pain management regimen will improve Outcome: Progressing

## 2020-11-20 NOTE — Progress Notes (Signed)
Daily Progress Note   Patient Name: [name]       Date: 11/20/2020 DOB: [birth year]  Age: 65 y.o. MRN#: [id number] Attending Physician: Elodia Florence., * Primary Care Physician: Lujean Amel, MD Admit Date: 11/16/2020  Reason for Consultation/Follow-up: Non pain symptom management, Pain control, Psychosocial/spiritual support and Terminal Care  Subjective: Received notification from [facility] hospice liaison that patient has to wait 10 days post last positive COVID test before they can accept patient. From my understanding, Dr. Florene Glen spoke with [facility] MD since first positive result was 10/08/20 - unfortunately, they are still unable to accept patient at this time.   Chart review performed - noted scheduled q4h ativan discontinued. Received report from primary RN - no acute concerns.   Went to visit patient at bedside - no family/visitors present. Patient was lying in bed asleep - she did not originally awake to voice/gentle touch. I adjusted her head to a more comfortable position and adjusted her pillow - at this time I asked how she was feeling, patient mumbled "I'm doing ok." I asked several other questions but did not get any other responses. No signs or non-verbal gestures of pain or discomfort noted. No respiratory distress, increased work of breathing, or secretions noted.   Notified TOC that, if family was interested, [facility] accepted COVID patient's. At this time I feel patient is still stable for transfer; however, I anticipate window for low risk transfer is closing. TOC will reach out to family and look into [facility].   Length of Stay: 3  Current Medications: Scheduled Meds:  . mouth rinse  15 mL Mouth Rinse BID  . sodium chloride flush  3 mL  Intravenous Q12H    Continuous Infusions:   PRN Meds: acetaminophen **OR** acetaminophen, albuterol, antiseptic oral rinse, bisacodyl, glycopyrrolate **OR** glycopyrrolate, hydroxypropyl methylcellulose / hypromellose, LORazepam, morphine injection, ondansetron **OR** ondansetron (ZOFRAN) IV, polyvinyl alcohol  Physical Exam Vitals and nursing note reviewed.  Constitutional:      General: She is not in acute distress.    Appearance: She is ill-appearing.     Comments: Frail appearing  Pulmonary:     Effort: No respiratory distress.  Skin:    General: Skin is warm and dry.  Neurological:     Mental Status: She is  lethargic.     Motor: Weakness present.             Vital Signs: BP (!) 111/94 (BP Location: Left Arm)   Pulse 80   Temp (!) 97.5 F (36.4 C) (Axillary)   Resp 16   Ht 5' (1.524 m)   Wt 40.2 kg   SpO2 98%   BMI 17.31 kg/m  SpO2: SpO2: 98 % O2 Device: O2 Device: Room Air O2 Flow Rate: O2 Flow Rate (L/min): 5 L/min  Intake/output summary:   Intake/Output Summary (Last 24 hours) at 11/20/2020 1557 Last data filed at 11/20/2020 0348 Gross per 24 hour  Intake --  Output 625 ml  Net -625 ml   LBM: Last BM Date: 11/18/20 Baseline Weight: Weight: 43 kg Most recent weight: Weight: 40.2 kg       Palliative Assessment/Data: PPS 10%      Patient Active Problem List   Diagnosis Date Noted  . Hypernatremia 11/17/2020  . Acute metabolic encephalopathy 37/06/6268  . Malnutrition of moderate degree 04/15/2020  . Hypercalcemia 04/14/2020  . Dehydration, severe 04/13/2020  . Dehydration 04/13/2020  . Acute hypernatremia   . Failure to thrive in adult   . Closed left hip fracture, initial encounter ([place]) 12/30/2019  . Frequent falls   . Orthostatic hypotension 12/23/2019  . Syncopal episodes 12/22/2019  . Dementia due to Parkinson's disease without behavioral disturbance ([place]) 12/22/2019  . Renal insufficiency 12/22/2019  . Near syncope 09/04/2019  . AKI  (acute kidney injury) ([place]) 09/04/2019  . Hypokalemia 09/04/2019  . Depression 09/04/2019  . Dementia ([place]) 09/04/2019  . Thrombocytopenia ([place]) 09/04/2019  . Low back pain 06/25/2019  . S/P AVR (aortic valve replacement) 12/12/2016  . Aortic stenosis 11/16/2016  . Severe aortic stenosis   . Polycythemia 06/06/2016  . Menopausal state 02/16/2016  . Hyperparathyroidism, primary ([place]) 02/16/2016  . Chronic diastolic CHF (congestive heart failure) ([place]) 05/07/2014  . Hx of colonic polyps 06/17/2013  . Ventricular tachycardia ([place]) 02/21/2011  . Automatic implantable cardioverter-defibrillator in situ 06/02/2009  . Hyperlipidemia 05/29/2009  . SMOKER 05/29/2009  . Essential hypertension 05/29/2009  . Aortic valve disorder 05/29/2009  . CARDIOMYOPATHY, SECONDARY 05/29/2009  . CHF 05/29/2009  . BICUSPID AORTIC VALVE 05/29/2009  . LIVER FUNCTION TESTS, ABNORMAL, HX OF 05/29/2009  . CHOLECYSTITIS, HX OF 05/29/2009    Palliative Care Assessment & Plan   Patient Profile: 65 y.o.femalewith past medical history of Parkinson's dementia, hypertension, hyperlipidemia, AICD placement, CAD, and chronic systolic CHF who presented to the emergency departmenton 2/22/2022with altered mental status.Per intake H&P, patient is verbal with 1-2 words and is frail and cachectic. Family reports she had a hip fracture about a year ago and has been non-ambulatory since. She was COVID positive 2 weeks ago.  ED Course:Reportedly more confused this morning, possible aspiration event. Febrile to 101.5, urinalysis suspicious for UTI but no evidence of sepsis. Sodium 163. Given IV fluid and tylenol. Family considering change in code status. Neurology recommending EEG.   Assessment: Acute metabolic encephalopathy Hypernatremia Dehydration AKI Dysphagia Advanced Parkinson's dementia Recent COVID19 infection  Recommendations/Plan:  Continue full comfort measures - prognosis likely days  Continue  DNR/DNI as previously documented  [facility] unable to accept patient due to recent COVID+ test; TOC looking into [facility]  Transfer to [facility] bed if/when available - patient's window for low risk transfer to hospice facility is likely closing soon  Continue current comfort focused medication regimen - patient appears comfortable  Nursing toadminister PRN medications as clinically necessary to ensure EOL comfort  PMT will continue to follow and support holistically  Goals of Care and Additional Recommendations:  Limitations on Scope of Treatment: Full Comfort Care  Code Status:    Code Status Orders  (From admission, onward)         Start     Ordered   11/18/20 1847  Do not attempt resuscitation (DNR)  Continuous       Question Answer Comment  In the event of cardiac or respiratory ARREST Do not call a "code blue"   In the event of cardiac or respiratory ARREST Do not perform Intubation, CPR, defibrillation or ACLS   In the event of cardiac or respiratory ARREST Use medication by any route, position, wound care, and other measures to relive pain and suffering. May use oxygen, suction and manual treatment of airway obstruction as needed for comfort.      11/18/20 1850        Code Status History    Date Active Date Inactive Code Status Order ID Comments User Context   11/18/2020 1643 11/18/2020 1850 DNR [id number]  Elodia Florence., MD Inpatient   11/16/2020 1704 11/18/2020 1642 Full Code [id number]  Karmen Bongo, MD ED   04/13/2020 1839 05/29/2020 0006 Full Code [id number]  Lequita Halt, MD ED   12/30/2019 0739 01/02/2020 2332 Full Code [id number]  Vianne Bulls, MD Inpatient   12/22/2019 1504 12/26/2019 2340 Full Code [id number]  Norval Morton, MD ED   09/04/2019 2342 09/05/2019 2141 Full Code [id number]  Lenore Cordia, MD ED   11/16/2016 1245 11/22/2016 1543 Full Code [id number]  Nani Skillern, PA-C Inpatient   07/05/2016 1140  07/05/2016 1744 Full Code [id number]  Burnell Blanks, MD Inpatient   Advance Care Planning Activity       Prognosis:   Hours - Days  Discharge Planning:  Hospice facility  Care plan was discussed with primary RN, TOC, [place] liaison, Dr. Florene Glen  Thank you for allowing the Palliative Medicine Team to assist in the care of this patient.   Total Time 15 minutes Prolonged Time Billed  no       Greater than 50%  of this time was spent counseling and coordinating care related to the above assessment and plan.  Lin Landsman, NP  Please contact Palliative Medicine Team phone at [phone] for questions and concerns.

## 2020-11-20 NOTE — Progress Notes (Addendum)
[facility] ([facility])  There is a bed to offer [name] at [facility] today.  Spoke with son [name] to confirm he would like to proceed with moving her to [facility].  Necessary consents have to be completed, and once they are this Probation officer will update [facility] manager so transport can be arranged.  RN staff, please call report to [phone] at any time.  Once d/c summary is completed, please fax to [phone].  Venia Carbon RN, BSN, [facility] Liaison   **addendum, per [facility] MD pt cannot come as her recent COVID + test was within the last 10 days per [facility] residential hospice policy, even though she is not on precautions currently. [facility] would not be able to offer a bed until 10 days from her last covid test, which was on 2/22.

## 2020-11-21 DIAGNOSIS — Z95810 Presence of automatic (implantable) cardiac defibrillator: Secondary | ICD-10-CM | POA: Diagnosis not present

## 2020-11-21 DIAGNOSIS — N179 Acute kidney failure, unspecified: Secondary | ICD-10-CM | POA: Diagnosis not present

## 2020-11-21 DIAGNOSIS — G9341 Metabolic encephalopathy: Secondary | ICD-10-CM | POA: Diagnosis not present

## 2020-11-21 DIAGNOSIS — I5032 Chronic diastolic (congestive) heart failure: Secondary | ICD-10-CM | POA: Diagnosis not present

## 2020-11-21 LAB — CULTURE, BLOOD (ROUTINE X 2)
Culture: NO GROWTH
Culture: NO GROWTH
Special Requests: ADEQUATE
Special Requests: ADEQUATE

## 2020-11-21 NOTE — Progress Notes (Signed)
PROGRESS NOTE    [name]  VOJ:[id number] DOB: 1956/04/29 DOA: 11/16/2020 PCP: Lujean Amel, MD   Chief Complaint  Patient presents with  . Altered Mental Status    Brief Narrative:  [name] is a 65 y.o. female with medical history significant of Parkinson's dementia; HLD; HTN; AICD placement; CAD; and chronic systolic CHF presenting with AMS.  Her son and her brother were present throughout the evaluation.  The patient is verbal with 1-2 words and is frail and cachectic.  Family reports that she had a hip fracture about a year ago and has been non-ambulatory since.  She has been failing since with increasing difficulties with feeding, speaking.  She was last able to feed herself a month or more ago and was able to have a limited conversation about 2 weeks ago.  Her brother believes that she would not want to live this way and certainly would not desire resuscitation but her son is not ready to make this decision.  ED Course:  Progressive dementia, cachexia.  More confused this AM, ?aspiration event.  Temp 101.5, looks like UTI but no evidence of sepsis.  Na+ 163.  Given IVF and tylenol, appears to be improving.  Considering change in code status.  Neurology recommends EEG but no plan to consult at this time.  COVID + 2 weeks ago.  Assessment & Plan:   Principal Problem:   Acute metabolic encephalopathy Active Problems:   Hyperlipidemia   Essential hypertension   Automatic implantable cardioverter-defibrillator in situ   Chronic diastolic CHF (congestive heart failure) (HCC)   AKI (acute kidney injury) ([place])   Dementia due to Parkinson's disease without behavioral disturbance (HCC)   Hypernatremia  Goals of care: transitioned to comfort measures after discussion with palliative.  Stable for transfer at this time. Continue comfort meds as indicated To inpatient hospice when bed available pending stability  Acute metabolic encephalopathy -Patient presenting with  encephalopathy as evidenced by her obtunded/unresponsiveness -not responding to voice today -suspected 2/2 UTI in setting of her dementia +/- hypernatremia -Will observe for now with IVF and abx -EEG with mild diffuse encephalopathy, nonspecific -head CT without acute abnormality  -unable to obtain MRI due to AICD -TSH wnl.  B12 and folate wnl. Ammonia wnl.  -she's been transitioned to comfort measures  Hypernatremia due to dehydration, with AKI  Hypotension Comfort measures  Dysphagia: SLP saw 2/24, recommended dysphagia 1, thin liquids via teaspoon/edge of cup, crushed meds with puree. Transitioned to comfort  Pyuria  Concern for UTI  SIRS Follow blood (ng) and urine cultures (pending) Continue cefepime  CXR without acute disease Tachypnea with leukocytosis, concern for UTI  Advanced Parkinson's dementia -She appears to have advanced dementia -Given concern for AMS, she does not appear to be able to swallow without aspiration risk at this time -resume sinemet, aricept, depakote for now (continue to hold zoloft for now) -Also with palliative care consultation - comfort  Malnutrition -Body mass index is 18.51 kg/m..  -The patient has at least 2 indicators for malnutrition (insufficient energy intake, weight loss, loss of muscle mass, loss of subcutaneous fat diminished functional status).  -This is likely due to starvation related/chronic disease -Will obtain a nutrition consult for further recommendations.  Recent COVID infection -[name] faxed notice that pt tested positive 10/08/20.  She's out of quarantine.   -positive on 2/22, but this is within 90 day window.  Can continue to follow off precautions.  HTN -continue to hold BP meds (bp appropriate) -  hold amlodipine/coreg  HLD -Hold Crestor for now  CAD/chronic systolic CHF with AICD -Hold ASA given concern for aspiration risk -Most recent echo in 08/2019 with EF 55-60% and grade 1 diastolic  dysfunction -She may be at greater risk for volume overload in this circumstance and needs to be closely monitored  Thrombocytopenia -Hold AC and place SCDs -likely 2/2 acute infection (UTI) -Will follow smear  Concern for afib She's on telemetry  EKG this AM with concern for atrial fib (I think this is more likely related to fine tremor that she has).  Will continue to monitor on tele, consider repeat ekg and/or discussion with cardiology.  She's not appropriate for anticoagulation at this time with thrombocytopenia. EKG this AM with paced rhythm, follow   DVT prophylaxis: SCD's with thrombocytopenia Code Status: full  Family Communication: none at bedside (discussed with son 2/25) Disposition:   Status is: Observation  The patient will require care spanning > 2 midnights and should be moved to inpatient because: Inpatient level of care appropriate due to severity of illness  Dispo: The patient is from: SNF              Anticipated d/c is to: SNF              Anticipated d/c date is: > 3 days              Patient currently is not medically stable to d/c.   Difficult to place patient No       Consultants:  none Procedures:  EEG  IMPRESSION: This study is suggestive of mild diffuse encephalopathy, nonspecific etiology. No seizures or epileptiform discharges were seen throughout the recording.  Per tech annotation patient was noted to have left arm twitching which was not visible on camera without concomitant eeg change and was most likely not epileptic   Antimicrobials:  Anti-infectives (From admission, onward)   Start     Dose/Rate Route Frequency Ordered Stop   11/18/20 1000  ceFEPIme (MAXIPIME) 2 g in sodium chloride 0.9 % 100 mL IVPB  Status:  Discontinued        2 g 200 mL/hr over 30 Minutes Intravenous Every 12 hours 11/18/20 0907 11/18/20 1846   11/17/20 1400  vancomycin (VANCOREADY) IVPB 500 mg/100 mL  Status:  Discontinued        500 mg 100 mL/hr over 60  Minutes Intravenous Every 24 hours 11/16/20 1444 11/16/20 1704   11/17/20 1300  ceFEPIme (MAXIPIME) 2 g in sodium chloride 0.9 % 100 mL IVPB  Status:  Discontinued        2 g 200 mL/hr over 30 Minutes Intravenous Every 24 hours 11/16/20 1711 11/18/20 0907   11/17/20 0200  ceFEPIme (MAXIPIME) 2 g in sodium chloride 0.9 % 100 mL IVPB  Status:  Discontinued        2 g 200 mL/hr over 30 Minutes Intravenous Every 12 hours 11/16/20 1444 11/16/20 1711   11/16/20 1345  vancomycin (VANCOREADY) IVPB 1000 mg/200 mL        1,000 mg 200 mL/hr over 60 Minutes Intravenous  Once 11/16/20 1330 11/16/20 1615   11/16/20 1345  ceFEPIme (MAXIPIME) 2 g in sodium chloride 0.9 % 100 mL IVPB        2 g 200 mL/hr over 30 Minutes Intravenous  Once 11/16/20 1331 11/16/20 1433         Subjective: Answers simple questions, denies complaint or discomfort  Objective: Vitals:   11/19/20 2343 11/20/20 1300 11/20/20  2035 11/21/20 0940  BP: (!) 111/94 (!) 131/97 (!) 142/101   Pulse: 80 95 80   Resp: 16 12 10    Temp: (!) 97.5 F (36.4 C) (!) 96.2 F (35.7 C) (!) 96.3 F (35.7 C)   TempSrc: Axillary Axillary Axillary   SpO2: 98% 100% 98% 99%  Weight:      Height:        Intake/Output Summary (Last 24 hours) at 11/21/2020 1028 Last data filed at 11/21/2020 0055 Gross per 24 hour  Intake -  Output 275 ml  Net -275 ml   Filed Weights   11/16/20 1318 11/17/20 1415  Weight: 43 kg 40.2 kg    Examination:  General: No acute distress.. Lungs: unlaboredy. Neurological: answers simple questions, lethargic Skin: Warm and dry. No rashes or lesions. Extremities: No clubbing or cyanosis. No edema. P   Data Reviewed: I have personally reviewed following labs and imaging studies  CBC: Recent Labs  Lab 11/16/20 1312 11/17/20 0258 11/18/20 0615  WBC 8.9 11.0* 6.3  NEUTROABS 5.9  --  3.7  HGB 14.2 13.6 10.6*  HCT 46.6* 42.2 32.5*  MCV 92.1 91.9 89.0  PLT 86* 65* 67*    Basic Metabolic Panel: Recent  Labs  Lab 11/16/20 2030 11/17/20 0258 11/17/20 1417 11/17/20 2154 11/18/20 0615  NA 162* 161* 160* 153* 147*  K 3.5 4.3 3.2* 2.9* 2.7*  CL 129* 128* 124* 122* 118*  CO2 23 21* 25 20* 20*  GLUCOSE 117* 110* 143* 159* 164*  BUN 42* 41* 35* 34* 26*  CREATININE 0.99 0.74 0.79 0.57 0.62  CALCIUM 10.5* 10.3 10.4* 9.7 9.7  MG  --   --   --   --  2.1  PHOS  --   --   --   --  1.7*    GFR: Estimated Creatinine Clearance: 43.9 mL/min (by C-G formula based on SCr of 0.62 mg/dL).  Liver Function Tests: Recent Labs  Lab 11/16/20 1312 11/18/20 0615  AST 52* 31  ALT 49* 48*  ALKPHOS 63 48  BILITOT 0.9 0.6  PROT 8.3* 6.2*  ALBUMIN 3.7 2.8*    CBG: Recent Labs  Lab 11/16/20 1314  GLUCAP 114*     Recent Results (from the past 240 hour(s))  Resp Panel by RT-PCR (Flu A&B, Covid) Nasopharyngeal Swab     Status: Abnormal   Collection Time: 11/16/20  1:12 PM   Specimen: Nasopharyngeal Swab; Nasopharyngeal(NP) swabs in vial transport medium  Result Value Ref Range Status   SARS Coronavirus 2 by RT PCR POSITIVE (A) NEGATIVE Final    Comment: RESULT CALLED TO, READ BACK BY AND VERIFIED WITH: RN M.EINIK AT 1523 ON 11/16/2020 BY T.SAAD (NOTE) SARS-CoV-2 target nucleic acids are DETECTED.  The SARS-CoV-2 RNA is generally detectable in upper respiratory specimens during the acute phase of infection. Positive results are indicative of the presence of the identified virus, but do not rule out bacterial infection or co-infection with other pathogens not detected by the test. Clinical correlation with patient history and other diagnostic information is necessary to determine patient infection status. The expected result is Negative.  Fact Sheet for Patients: EntrepreneurPulse.com.au  Fact Sheet for Healthcare Providers: IncredibleEmployment.be  This test is not yet approved or cleared by the [place] FDA and  has been authorized for detection  and/or diagnosis of SARS-CoV-2 by FDA under an Emergency Use Authorization (EUA).  This EUA will remain in effect (meaning this te st can be used) for the duration of  the COVID-19 declaration under Section 564(b)(1) of the Act, 21 U.S.C. section 360bbb-3(b)(1), unless the authorization is terminated or revoked sooner.     Influenza A by PCR NEGATIVE NEGATIVE Final   Influenza B by PCR NEGATIVE NEGATIVE Final    Comment: (NOTE) The Xpert Xpress SARS-CoV-2/FLU/RSV plus assay is intended as an aid in the diagnosis of influenza from Nasopharyngeal swab specimens and should not be used as a sole basis for treatment. Nasal washings and aspirates are unacceptable for Xpert Xpress SARS-CoV-2/FLU/RSV testing.  Fact Sheet for Patients: EntrepreneurPulse.com.au  Fact Sheet for Healthcare Providers: IncredibleEmployment.be  This test is not yet approved or cleared by the [place] FDA and has been authorized for detection and/or diagnosis of SARS-CoV-2 by FDA under an Emergency Use Authorization (EUA). This EUA will remain in effect (meaning this test can be used) for the duration of the COVID-19 declaration under Section 564(b)(1) of the Act, 21 U.S.C. section 360bbb-3(b)(1), unless the authorization is terminated or revoked.  Performed at [facility], [place] [address], [place], [place] [address]   Blood Culture (routine x 2)     Status: None (Preliminary result)   Collection Time: 11/16/20  1:12 PM   Specimen: BLOOD RIGHT FOREARM  Result Value Ref Range Status   Specimen Description BLOOD RIGHT FOREARM  Final   Special Requests   Final    BOTTLES DRAWN AEROBIC AND ANAEROBIC Blood Culture adequate volume   Culture   Final    NO GROWTH 4 DAYS Performed at [facility], [place] [address], [place], [place] [address]    Report Status PENDING  Incomplete  Urine culture     Status: Abnormal   Collection Time: 11/16/20  1:12 PM   Specimen:  In/Out Cath Urine  Result Value Ref Range Status   Specimen Description IN/OUT CATH URINE  Final   Special Requests   Final    NONE Performed at [facility], [place] [address] [place], [place] [address]    Culture MULTIPLE SPECIES PRESENT, SUGGEST RECOLLECTION (A)  Final   Report Status 11/18/2020 FINAL  Final  Blood Culture (routine x 2)     Status: None (Preliminary result)   Collection Time: 11/16/20  1:17 PM   Specimen: BLOOD RIGHT WRIST  Result Value Ref Range Status   Specimen Description BLOOD RIGHT WRIST  Final   Special Requests   Final    BOTTLES DRAWN AEROBIC AND ANAEROBIC Blood Culture adequate volume   Culture   Final    NO GROWTH 4 DAYS Performed at [facility], [place] [address], [place], [place] [address]    Report Status PENDING  Incomplete  MRSA PCR Screening     Status: None   Collection Time: 11/17/20 11:39 PM   Specimen: Nasal Mucosa; Nasopharyngeal  Result Value Ref Range Status   MRSA by PCR NEGATIVE NEGATIVE Final    Comment:        The GeneXpert MRSA Assay (FDA approved for NASAL specimens only), is one component of a comprehensive MRSA colonization surveillance program. It is not intended to diagnose MRSA infection nor to guide or monitor treatment for MRSA infections. Performed at [facility], [place] [address] [place], [place] [address]          Radiology Studies: No results found.      Scheduled Meds: . mouth rinse  15 mL Mouth Rinse BID  . sodium chloride flush  3 mL Intravenous Q12H   Continuous  Infusions:    LOS: 4 days    Time spent: over 30 min    Fayrene Helper, MD [facility]   To contact the attending provider between 7A-7P or the covering provider during after hours 7P-7A, please log into the web site [email or web address] and access using universal [place] password for that web site. If you do not have the password, please call the hospital operator.  11/21/2020, 10:28 AM

## 2020-11-21 NOTE — TOC Progression Note (Signed)
Transition of Care [facility]) - Progression Note    Patient Details  Name: [name] MRN: [id number] Date of Birth: 27-Jun-1956  Transition of Care [facility]) CM/SW Contact  Eithel Ryall [place], [place] Phone Number: 11/21/2020, 9:02 AM  Clinical Narrative:    Return phone call from [facility] with [place]-referral for residential hospice completed. Per Tharon Aquas, they have no beds today, may have a opening next week.   Patria Warzecha, LCSW Transitions of Care [phone]    Expected Discharge Plan: [place] Barriers to Discharge: Hospice Bed not available  Expected Discharge Plan and Services Expected Discharge Plan: [place] In-house Referral: Clinical Social Work,Hospice / [place] Acute Care Choice: Hospice Living arrangements for the past 2 months: [place]: Hospice and [place] Date [place]: 11/19/20 Time [place]: 1003 Representative spoke with at [place]: [place] ([place]) Interventions    Readmission Risk Interventions Readmission Risk Prevention Plan 01/02/2020 12/25/2019  Transportation Screening Complete Complete  PCP or Specialist Appt within 5-7 Days Complete Complete  Home Care Screening Complete Complete  Medication Review (RN CM) Complete Complete  Some recent data might be hidden

## 2020-11-21 NOTE — Progress Notes (Signed)
Daily Progress Note   Patient Name: [name]       Date: 11/21/2020 DOB: [birth year]  Age: 65 y.o. MRN#: [id number] Attending Physician: Elodia Florence., * Primary Care Physician: Lujean Amel, MD Admit Date: 11/16/2020  Reason for Consultation/Follow-up: Non pain symptom management, Pain control, Psychosocial/spiritual support and Terminal Care  Subjective: Chart review performed. Received report from primary RN - no acute concerns.  Went to visit patient at bedside - no family/visitors present. Patient is lying in bed asleep. I adjust her pillow again today and she does wake for a moment - she denies pain and quickly falls back asleep. No signs or non-verbal gestures of pain or discomfort noted. No respiratory distress, increased work of breathing, or secretions noted. Patient is resting peacefully.  Length of Stay: 4  Current Medications: Scheduled Meds:  . mouth rinse  15 mL Mouth Rinse BID  . sodium chloride flush  3 mL Intravenous Q12H    Continuous Infusions:   PRN Meds: acetaminophen **OR** acetaminophen, albuterol, antiseptic oral rinse, bisacodyl, glycopyrrolate **OR** glycopyrrolate, hydroxypropyl methylcellulose / hypromellose, LORazepam, morphine injection, ondansetron **OR** ondansetron (ZOFRAN) IV, polyvinyl alcohol  Physical Exam Vitals and nursing note reviewed.  Constitutional:      General: She is not in acute distress.    Appearance: She is cachectic. She is ill-appearing.  Pulmonary:     Effort: No respiratory distress.  Skin:    General: Skin is warm and dry.  Neurological:     Mental Status: She is lethargic.     Motor: Weakness present.             Vital Signs: BP (!) 142/100 (BP Location: Left Arm)   Pulse 75   Temp (!) 96.3 F (35.7  C) (Axillary)   Resp (!) 9   Ht 5' (1.524 m)   Wt 40.2 kg   SpO2 98%   BMI 17.31 kg/m  SpO2: SpO2: 98 % O2 Device: O2 Device: Room Air O2 Flow Rate: O2 Flow Rate (L/min): 5 L/min  Intake/output summary:   Intake/Output Summary (Last 24 hours) at 11/21/2020 1220 Last data filed at 11/21/2020 0055 Gross per 24 hour  Intake --  Output 275 ml  Net -275 ml   LBM: Last BM Date: 11/18/20 Baseline Weight: Weight: 43 kg Most recent weight: Weight:  40.2 kg       Palliative Assessment/Data: PPS 10%      Patient Active Problem List   Diagnosis Date Noted  . Hypernatremia 11/17/2020  . Acute metabolic encephalopathy 63/14/9702  . Malnutrition of moderate degree 04/15/2020  . Hypercalcemia 04/14/2020  . Dehydration, severe 04/13/2020  . Dehydration 04/13/2020  . Acute hypernatremia   . Failure to thrive in adult   . Closed left hip fracture, initial encounter ([place]) 12/30/2019  . Frequent falls   . Orthostatic hypotension 12/23/2019  . Syncopal episodes 12/22/2019  . Dementia due to Parkinson's disease without behavioral disturbance ([place]) 12/22/2019  . Renal insufficiency 12/22/2019  . Near syncope 09/04/2019  . AKI (acute kidney injury) ([place]) 09/04/2019  . Hypokalemia 09/04/2019  . Depression 09/04/2019  . Dementia ([place]) 09/04/2019  . Thrombocytopenia ([place]) 09/04/2019  . Low back pain 06/25/2019  . S/P AVR (aortic valve replacement) 12/12/2016  . Aortic stenosis 11/16/2016  . Severe aortic stenosis   . Polycythemia 06/06/2016  . Menopausal state 02/16/2016  . Hyperparathyroidism, primary ([place]) 02/16/2016  . Chronic diastolic CHF (congestive heart failure) ([place]) 05/07/2014  . Hx of colonic polyps 06/17/2013  . Ventricular tachycardia ([place]) 02/21/2011  . Automatic implantable cardioverter-defibrillator in situ 06/02/2009  . Hyperlipidemia 05/29/2009  . SMOKER 05/29/2009  . Essential hypertension 05/29/2009  . Aortic valve disorder 05/29/2009  . CARDIOMYOPATHY,  SECONDARY 05/29/2009  . CHF 05/29/2009  . BICUSPID AORTIC VALVE 05/29/2009  . LIVER FUNCTION TESTS, ABNORMAL, HX OF 05/29/2009  . CHOLECYSTITIS, HX OF 05/29/2009    Palliative Care Assessment & Plan   Patient Profile: 65 y.o.femalewith past medical history of Parkinson's dementia, hypertension, hyperlipidemia, AICD placement, CAD, and chronic systolic CHF who presented to the emergency departmenton 2/22/2022with altered mental status.Per intake H&P, patient is verbal with 1-2 words and is frail and cachectic. Family reports she had a hip fracture about a year ago and has been non-ambulatory since. She was COVID positive 2 weeks ago.  ED Course:Reportedly more confused this morning, possible aspiration event. Febrile to 101.5, urinalysis suspicious for UTI but no evidence of sepsis. Sodium 163. Given IV fluid and tylenol. Family considering change in code status. Neurology recommending EEG.  Assessment: Acute metabolic encephalopathy Hypernatremia Dehydration AKI Dysphagia Advanced Parkinson's dementia Recent COVID19 infection  Recommendations/Plan:  Continue full comfort measures - prognosis likely days  Continue DNR/DNI as previously documented  Transfer to [facility] when bed available  Continue current comfort focused medication regimen - patient appears comfortable   Nursing toadminister PRN medications as clinically necessary to ensure EOL comfort  PMT will continue to follow and support holistically   Goals of Care and Additional Recommendations:  Limitations on Scope of Treatment: Full Comfort Care  Code Status:    Code Status Orders  (From admission, onward)         Start     Ordered   11/18/20 1847  Do not attempt resuscitation (DNR)  Continuous       Question Answer Comment  In the event of cardiac or respiratory ARREST Do not call a "code blue"   In the event of cardiac or respiratory ARREST Do not perform Intubation, CPR,  defibrillation or ACLS   In the event of cardiac or respiratory ARREST Use medication by any route, position, wound care, and other measures to relive pain and suffering. May use oxygen, suction and manual treatment of airway obstruction as needed for comfort.      11/18/20 1850  Code Status History    Date Active Date Inactive Code Status Order ID Comments User Context   11/18/2020 1643 11/18/2020 1850 DNR [id number]  Elodia Florence., MD Inpatient   11/16/2020 1704 11/18/2020 1642 Full Code [id number]  Karmen Bongo, MD ED   04/13/2020 1839 05/29/2020 0006 Full Code [id number]  Lequita Halt, MD ED   12/30/2019 0739 01/02/2020 2332 Full Code [id number]  Vianne Bulls, MD Inpatient   12/22/2019 1504 12/26/2019 2340 Full Code [id number]  Norval Morton, MD ED   09/04/2019 2342 09/05/2019 2141 Full Code [id number]  Lenore Cordia, MD ED   11/16/2016 1245 11/22/2016 1543 Full Code [id number]  Nani Skillern, PA-C Inpatient   07/05/2016 1140 07/05/2016 1744 Full Code [id number]  Burnell Blanks, MD Inpatient   Advance Care Planning Activity       Prognosis:   < 2 weeks  Discharge Planning:  Hospice facility  Care plan was discussed with primary RN  Thank you for allowing the Palliative Medicine Team to assist in the care of this patient.   Total Time 15 minutes Prolonged Time Billed  no       Greater than 50%  of this time was spent counseling and coordinating care related to the above assessment and plan.  Lin Landsman, NP  Please contact Palliative Medicine Team phone at [phone] for questions and concerns.

## 2020-11-22 DIAGNOSIS — N179 Acute kidney failure, unspecified: Secondary | ICD-10-CM | POA: Diagnosis not present

## 2020-11-22 DIAGNOSIS — N390 Urinary tract infection, site not specified: Secondary | ICD-10-CM | POA: Diagnosis not present

## 2020-11-22 DIAGNOSIS — I5032 Chronic diastolic (congestive) heart failure: Secondary | ICD-10-CM

## 2020-11-22 DIAGNOSIS — G9341 Metabolic encephalopathy: Secondary | ICD-10-CM | POA: Diagnosis not present

## 2020-11-22 MED ORDER — CHLORHEXIDINE GLUCONATE CLOTH 2 % EX PADS
6.0000 | MEDICATED_PAD | Freq: Every day | CUTANEOUS | Status: DC
  Administered 2020-11-22 – 2020-11-26 (×5): 6 via TOPICAL

## 2020-11-22 NOTE — Progress Notes (Signed)
PROGRESS NOTE    [name]  IOE:[id number] DOB: [birth year] DOA: 11/16/2020 PCP: Lujean Amel, MD   Chief Complaint  Patient presents with  . Altered Mental Status    Brief Narrative:  [name] is [name] 65 y.o. female with medical history significant of Parkinson's dementia; HLD; HTN; AICD placement; CAD; and chronic systolic CHF presenting with AMS.  Her son and her brother were present throughout the evaluation.  The patient is verbal with 1-2 words and is frail and cachectic.  Family reports that she had [name] hip fracture about [name] year ago and has been non-ambulatory since.  She has been failing since with increasing difficulties with feeding, speaking.  She was last able to feed herself [name] month or more ago and was able to have [name] limited conversation about 2 weeks ago.  Her brother believes that she would not want to live this way and certainly would not desire resuscitation but her son is not ready to make this decision.  ED Course:  Progressive dementia, cachexia.  More confused this AM, ?aspiration event.  Temp 101.5, looks like UTI but no evidence of sepsis.  Na+ 163.  Given IVF and tylenol, appears to be improving.  Considering change in code status.  Neurology recommends EEG but no plan to consult at this time.  COVID + 2 weeks ago.  Assessment & Plan:   Principal Problem:   Acute metabolic encephalopathy Active Problems:   Hyperlipidemia   Essential hypertension   Automatic implantable cardioverter-defibrillator in situ   Chronic diastolic CHF (congestive heart failure) (HCC)   AKI (acute kidney injury) ([place])   Dementia due to Parkinson's disease without behavioral disturbance (HCC)   Hypernatremia  Goals of care: transitioned to comfort measures after discussion with palliative.  Stable for transfer at this time to inpatient hospice when bed available. Continue comfort meds as indicated  Acute metabolic encephalopathy -Patient presenting with encephalopathy as  evidenced by her obtunded/unresponsiveness -not responding to voice today -suspected 2/2 UTI in setting of her dementia +/- hypernatremia -Will observe for now with IVF and abx -EEG with mild diffuse encephalopathy, nonspecific -head CT without acute abnormality  -unable to obtain MRI due to AICD -TSH wnl.  B12 and folate wnl. Ammonia wnl.  -she's been transitioned to comfort measures  Hypernatremia due to dehydration, with AKI  Hypotension Comfort measures  Dysphagia: SLP saw 2/24, recommended dysphagia 1, thin liquids via teaspoon/edge of cup, crushed meds with puree. Transitioned to comfort  Pyuria  Concern for UTI  SIRS Follow blood (ng) and urine cultures (pending) Continue cefepime  CXR without acute disease Tachypnea with leukocytosis, concern for UTI  Advanced Parkinson's dementia -She appears to have advanced dementia -Given concern for AMS, she does not appear to be able to swallow without aspiration risk at this time -resume sinemet, aricept, depakote for now (continue to hold zoloft for now) -Also with palliative care consultation - comfort  Malnutrition -Body mass index is 18.51 kg/m..  -The patient has at least 2 indicators for malnutrition (insufficient energy intake, weight loss, loss of muscle mass, loss of subcutaneous fat diminished functional status).  -This is likely due to starvation related/chronic disease -Will obtain [name] nutrition consult for further recommendations.  Recent COVID infection -[name] faxed notice that pt tested positive 10/08/20.  She's out of quarantine.   -positive on 2/22, but this is within 90 day window.  Can continue to follow off precautions.  HTN -continue to hold BP meds (bp appropriate) - hold  amlodipine/coreg  HLD -Hold Crestor for now  CAD/chronic systolic CHF with AICD -Hold ASA given concern for aspiration risk -Most recent echo in 08/2019 with EF 55-60% and grade 1 diastolic dysfunction -She may be at  greater risk for volume overload in this circumstance and needs to be closely monitored  Thrombocytopenia -Hold AC and place SCDs -likely 2/2 acute infection (UTI) -Will follow smear  Concern for afib She's on telemetry  EKG this AM with concern for atrial fib (I think this is more likely related to fine tremor that she has).  Will continue to monitor on tele, consider repeat ekg and/or discussion with cardiology.  She's not appropriate for anticoagulation at this time with thrombocytopenia. EKG this AM with paced rhythm, follow   DVT prophylaxis: SCD's with thrombocytopenia Code Status: full  Family Communication: none at bedside (discussed with son 2/25) Disposition:   Status is: inpatient  The patient will require care spanning > 2 midnights and should be moved to inpatient because: Inpatient level of care appropriate due to severity of illness  Dispo: The patient is from: SNF              Anticipated d/c is to: SNF              Anticipated d/c date is: > 3 days              Patient currently is not medically stable to d/c.   Difficult to place patient No       Consultants:  none Procedures:  EEG  IMPRESSION: This study is suggestive of mild diffuse encephalopathy, nonspecific etiology. No seizures or epileptiform discharges were seen throughout the recording.  Per tech annotation patient was noted to have left arm twitching which was not visible on camera without concomitant eeg change and was most likely not epileptic   Antimicrobials:  Anti-infectives (From admission, onward)   Start     Dose/Rate Route Frequency Ordered Stop   11/18/20 1000  ceFEPIme (MAXIPIME) 2 g in sodium chloride 0.9 % 100 mL IVPB  Status:  Discontinued        2 g 200 mL/hr over 30 Minutes Intravenous Every 12 hours 11/18/20 0907 11/18/20 1846   11/17/20 1400  vancomycin (VANCOREADY) IVPB 500 mg/100 mL  Status:  Discontinued        500 mg 100 mL/hr over 60 Minutes Intravenous Every 24  hours 11/16/20 1444 11/16/20 1704   11/17/20 1300  ceFEPIme (MAXIPIME) 2 g in sodium chloride 0.9 % 100 mL IVPB  Status:  Discontinued        2 g 200 mL/hr over 30 Minutes Intravenous Every 24 hours 11/16/20 1711 11/18/20 0907   11/17/20 0200  ceFEPIme (MAXIPIME) 2 g in sodium chloride 0.9 % 100 mL IVPB  Status:  Discontinued        2 g 200 mL/hr over 30 Minutes Intravenous Every 12 hours 11/16/20 1444 11/16/20 1711   11/16/20 1345  vancomycin (VANCOREADY) IVPB 1000 mg/200 mL        1,000 mg 200 mL/hr over 60 Minutes Intravenous  Once 11/16/20 1330 11/16/20 1615   11/16/20 1345  ceFEPIme (MAXIPIME) 2 g in sodium chloride 0.9 % 100 mL IVPB        2 g 200 mL/hr over 30 Minutes Intravenous  Once 11/16/20 1331 11/16/20 1433         Subjective: Denies discomfort   Objective: Vitals:   11/21/20 0940 11/21/20 1200 11/21/20 2026 11/22/20 0401  BP:  Marland Kitchen)  142/100 (!) 166/104   Pulse:  75 73 68  Resp:  (!) 9 12   Temp:  (!) 96.3 F (35.7 C) 97.8 F (36.6 C)   TempSrc:  Axillary Oral   SpO2: 99% 98% 100% 98%  Weight:    40.6 kg  Height:        Intake/Output Summary (Last 24 hours) at 11/22/2020 0917 Last data filed at 11/21/2020 1800 Gross per 24 hour  Intake --  Output 150 ml  Net -150 ml   Filed Weights   11/16/20 1318 11/17/20 1415 11/22/20 0401  Weight: 43 kg 40.2 kg 40.6 kg    Examination:  General: No acute distress.. Lungs: unlabored Neurological: answers simple questions, denies pain  Skin: Warm and dry. No rashes or lesions. Extremities: No clubbing or cyanosis. No edema.     Data Reviewed: I have personally reviewed following labs and imaging studies  CBC: Recent Labs  Lab 11/16/20 1312 11/17/20 0258 11/18/20 0615  WBC 8.9 11.0* 6.3  NEUTROABS 5.9  --  3.7  HGB 14.2 13.6 10.6*  HCT 46.6* 42.2 32.5*  MCV 92.1 91.9 89.0  PLT 86* 65* 67*    Basic Metabolic Panel: Recent Labs  Lab 11/16/20 2030 11/17/20 0258 11/17/20 1417 11/17/20 2154  11/18/20 0615  NA 162* 161* 160* 153* 147*  K 3.5 4.3 3.2* 2.9* 2.7*  CL 129* 128* 124* 122* 118*  CO2 23 21* 25 20* 20*  GLUCOSE 117* 110* 143* 159* 164*  BUN 42* 41* 35* 34* 26*  CREATININE 0.99 0.74 0.79 0.57 0.62  CALCIUM 10.5* 10.3 10.4* 9.7 9.7  MG  --   --   --   --  2.1  PHOS  --   --   --   --  1.7*    GFR: Estimated Creatinine Clearance: 44.3 mL/min (by C-G formula based on SCr of 0.62 mg/dL).  Liver Function Tests: Recent Labs  Lab 11/16/20 1312 11/18/20 0615  AST 52* 31  ALT 49* 48*  ALKPHOS 63 48  BILITOT 0.9 0.6  PROT 8.3* 6.2*  ALBUMIN 3.7 2.8*    CBG: Recent Labs  Lab 11/16/20 1314  GLUCAP 114*     Recent Results (from the past 240 hour(s))  Resp Panel by RT-PCR (Flu Allia Wiltsey&B, Covid) Nasopharyngeal Swab     Status: Abnormal   Collection Time: 11/16/20  1:12 PM   Specimen: Nasopharyngeal Swab; Nasopharyngeal(NP) swabs in vial transport medium  Result Value Ref Range Status   SARS Coronavirus 2 by RT PCR POSITIVE (Bostyn Bogie) NEGATIVE Final    Comment: RESULT CALLED TO, READ BACK BY AND VERIFIED WITH: RN M.EINIK AT 1523 ON 11/16/2020 BY T.SAAD (NOTE) SARS-CoV-2 target nucleic acids are DETECTED.  The SARS-CoV-2 RNA is generally detectable in upper respiratory specimens during the acute phase of infection. Positive results are indicative of the presence of the identified virus, but do not rule out bacterial infection or co-infection with other pathogens not detected by the test. Clinical correlation with patient history and other diagnostic information is necessary to determine patient infection status. The expected result is Negative.  Fact Sheet for Patients: EntrepreneurPulse.com.au  Fact Sheet for Healthcare Providers: IncredibleEmployment.be  This test is not yet approved or cleared by the [place] FDA and  has been authorized for detection and/or diagnosis of SARS-CoV-2 by FDA under an Emergency Use  Authorization (EUA).  This EUA will remain in effect (meaning this te st can be used) for the duration of  the COVID-19 declaration  under Section 564(b)(1) of the Act, 21 U.S.C. section 360bbb-3(b)(1), unless the authorization is terminated or revoked sooner.     Influenza Asiah Befort by PCR NEGATIVE NEGATIVE Final   Influenza B by PCR NEGATIVE NEGATIVE Final    Comment: (NOTE) The Xpert Xpress SARS-CoV-2/FLU/RSV plus assay is intended as an aid in the diagnosis of influenza from Nasopharyngeal swab specimens and should not be used as Alyx Mcguirk sole basis for treatment. Nasal washings and aspirates are unacceptable for Xpert Xpress SARS-CoV-2/FLU/RSV testing.  Fact Sheet for Patients: EntrepreneurPulse.com.au  Fact Sheet for Healthcare Providers: IncredibleEmployment.be  This test is not yet approved or cleared by the [place] FDA and has been authorized for detection and/or diagnosis of SARS-CoV-2 by FDA under an Emergency Use Authorization (EUA). This EUA will remain in effect (meaning this test can be used) for the duration of the COVID-19 declaration under Section 564(b)(1) of the Act, 21 U.S.C. section 360bbb-3(b)(1), unless the authorization is terminated or revoked.  Performed at [facility], [place] [address], [place], [place] [address]   Blood Culture (routine x 2)     Status: None   Collection Time: 11/16/20  1:12 PM   Specimen: BLOOD RIGHT FOREARM  Result Value Ref Range Status   Specimen Description BLOOD RIGHT FOREARM  Final   Special Requests   Final    BOTTLES DRAWN AEROBIC AND ANAEROBIC Blood Culture adequate volume   Culture   Final    NO GROWTH 5 DAYS Performed at [facility], [place] [address], [place], [place] [address]    Report Status 11/21/2020 FINAL  Final  Urine culture     Status: Abnormal   Collection Time: 11/16/20  1:12 PM   Specimen: In/Out Cath Urine  Result Value Ref Range Status   Specimen Description  IN/OUT CATH URINE  Final   Special Requests   Final    NONE Performed at [facility], [place] [address], [place], [place] [address]    Culture MULTIPLE SPECIES PRESENT, SUGGEST RECOLLECTION ([place])  Final   Report Status 11/18/2020 FINAL  Final  Blood Culture (routine x 2)     Status: None   Collection Time: 11/16/20  1:17 PM   Specimen: BLOOD RIGHT WRIST  Result Value Ref Range Status   Specimen Description BLOOD RIGHT WRIST  Final   Special Requests   Final    BOTTLES DRAWN AEROBIC AND ANAEROBIC Blood Culture adequate volume   Culture   Final    NO GROWTH 5 DAYS Performed at [facility], [place] [address] [place], [place] [address]    Report Status 11/21/2020 FINAL  Final  MRSA PCR Screening     Status: None   Collection Time: 11/17/20 11:39 PM   Specimen: Nasal Mucosa; Nasopharyngeal  Result Value Ref Range Status   MRSA by PCR NEGATIVE NEGATIVE Final    Comment:        The GeneXpert MRSA Assay (FDA approved for NASAL specimens only), is one component of Terence Bart comprehensive MRSA colonization surveillance program. It is not intended to diagnose MRSA infection nor to guide or monitor treatment for MRSA infections. Performed at [facility], [place] [address], [place], [place] [address]          Radiology Studies: No results found.      Scheduled Meds: . mouth rinse  15 mL Mouth Rinse BID  . sodium chloride flush  3 mL Intravenous Q12H   Continuous Infusions:    LOS:  5 days    Time spent: over 30 min    Fayrene Helper, MD [facility]   To contact the attending provider between 7A-7P or the covering provider during after hours 7P-7A, please log into the web site [email or web address] and access using universal [place] password for that web site. If you do not have the password, please call the hospital operator.  11/22/2020, 9:17 AM

## 2020-11-22 NOTE — Care Management Important Message (Signed)
Important Message  Patient Details  Name: [name] MRN: [id number] Date of Birth: 1955-11-11   Medicare Important Message Given:  Yes - Important Message mailed due to current National Emergency   Verbal consent obtained due to current National Emergency  Relationship to patient: Self Contact Name: Sanita Call Date: 11/22/20  Time: 1312 Phone: [phone] Outcome: No Answer/Busy Important Message mailed to: Patient address on file    Delorse Lek 11/22/2020, 1:12 PM

## 2020-11-22 NOTE — TOC Progression Note (Signed)
Transition of Care [facility]) - Progression Note    Patient Details  Name: [name] MRN: [id number] Date of Birth: 12-12-1955  Transition of Care [facility]) CM/SW [place], LCSW Phone Number: 11/22/2020, 2:50 PM  Clinical Narrative:    No beds available at [facility].    Expected Discharge Plan: [place] Barriers to Discharge: Hospice Bed not available  Expected Discharge Plan and Services Expected Discharge Plan: [place] In-house Referral: Clinical Social Work,Hospice / [place] Acute Care Choice: Hospice Living arrangements for the past 2 months: [place]: Hospice and [place] Date [place]: 11/19/20 Time [place]: 1003 Representative spoke with at [place]: [place] ([place]) Interventions    Readmission Risk Interventions Readmission Risk Prevention Plan 01/02/2020 12/25/2019  Transportation Screening Complete Complete  PCP or Specialist Appt within 5-7 Days Complete Complete  Home Care Screening Complete Complete  Medication Review (RN CM) Complete Complete  Some recent data might be hidden

## 2020-11-22 NOTE — Progress Notes (Signed)
   I have reached out to the son to see if they have interest in the Hospice home at [facility]. I was unsuccessful in reaching him but did leave a Voice mail to return my call.   We unfortunately do not have a bed to offer the pt/ family today due to facility being at capacity. We will reach out to the family again tomorrow and also Transitions of care team to update on bed availability.   Webb Silversmith RN [phone]

## 2020-11-22 NOTE — Progress Notes (Signed)
Daily Progress Note   Patient Name: [name]       Date: 11/22/2020 DOB: 1956/06/24  Age: 65 y.o. MRN#: [id number] Attending Physician: Elodia Florence., * Primary Care Physician: Lujean Amel, MD Admit Date: 11/16/2020  Reason for Follow-up: end of life care, symptom management  Subjective: Patient appears comfortable. She denies pain and does not any non-verbal signs of pain. Respirations are even and unlabored. She is lethargic, but will intermittently answer questions with 1-2 word responses. Brother Okey Dupre is at bedside. Provided education and counseling on expectations at end of life. Emotional support provided.   Length of Stay: 5  Current Medications: Scheduled Meds:  . Chlorhexidine Gluconate Cloth  6 each Topical Q0600  . mouth rinse  15 mL Mouth Rinse BID  . sodium chloride flush  3 mL Intravenous Q12H       PRN Meds: acetaminophen **OR** acetaminophen, albuterol, antiseptic oral rinse, bisacodyl, glycopyrrolate **OR** glycopyrrolate, hydroxypropyl methylcellulose / hypromellose, LORazepam, morphine injection, ondansetron **OR** ondansetron (ZOFRAN) IV, polyvinyl alcohol          Vital Signs: BP (!) 166/104 (BP Location: Left Arm)   Pulse 68   Temp 97.8 F (36.6 C) (Oral)   Resp 12   Ht 5' (1.524 m)   Wt 40.6 kg   SpO2 98%   BMI 17.48 kg/m  SpO2: SpO2: 98 % O2 Device: O2 Device: Room Air O2 Flow Rate: O2 Flow Rate (L/min): 5 L/min  Intake/output summary: No intake or output data in the 24 hours ending 11/22/20 1801 LBM: Last BM Date: 11/18/20 Baseline Weight: Weight: 43 kg Most recent weight: Weight: 40.6 kg       Palliative Assessment/Data: PPS 10%         Palliative Care Assessment & Plan   HPI/Patient Profile: 65 y.o. female  with  past medical history of Parkinson's dementia, hypertension, hyperlipidemia, AICD placement, CAD, and chronic systolic CHF who presented to the emergency department on 11/16/2020 with altered mental status. Per intake H&P, patient is verbal with 1-2 words and is frail and cachectic. Family reports she had a hip fracture about a year ago and has been non-ambulatory since. She was COVID positive 2 weeks ago.  ED Course: Reportedly more confused this morning, possible aspiration event. Febrile to 101.5, urinalysis suspicious for UTI but no  evidence of sepsis. Sodium 163. Given IV fluid and tylenol. Family considering change in code status. Neurology recommending EEG.    Assessment: - acute metabolic encephalopathy - chronic diastolic CHF - AICD present - pyuria, concern for UTI - advanced Parkinson's dementia - recent COVID infection - hypernatremia due to dehydration - malnutrition - AKI, improved - dysphagia - acute hypoxic respiratory failure secondary to aspiration event    Recommendations/Plan:  Continue full comfort measures  DNR/DNI as previously documented  Transfer to [facility] facility in [place] when bed available  Nursing to administer PRN medications as clinically appropriate to ensure EOL comfort  PMT will continue to follow  Goals of Care and Additional Recommendations:  Limitations on Scope of Treatment: Full Comfort Care  Code Status:  Prognosis:   < 2 weeks, likely days  Discharge Planning:  Residential hospice versus hospital death   Thank you for allowing the Palliative Medicine Team to assist in the care of this patient.   Total Time 15 minutes Prolonged Time Billed  no       Greater than 50%  of this time was spent counseling and coordinating care related to the above assessment and plan.  Lavena Bullion, NP  Please contact Palliative Medicine Team phone at [phone] for questions and concerns.

## 2020-11-23 DIAGNOSIS — G9341 Metabolic encephalopathy: Secondary | ICD-10-CM | POA: Diagnosis not present

## 2020-11-23 NOTE — Progress Notes (Signed)
   I have reached out to transitions care team to update that we unfortunately do not have a bed available today to offer due to capacity. However if something changes we will reach back out.   I have also inquired about the AICD and if this has been deactivated. Which we would like to have done before the pt comes to the Hospice home in [facility] if possible especially since it appears it will be for end of life care.   I have not been able to reach the son as of this time. I will continue to try. Webb Silversmith RN [phone]

## 2020-11-23 NOTE — Progress Notes (Signed)
PROGRESS NOTE    [name]  GNF:[id number] DOB: May 18, 1956 DOA: 11/16/2020 PCP: Lujean Amel, MD   Chief Complaint  Patient presents with  . Altered Mental Status    Brief Narrative:  [name] is [name] 65 y.o. female with medical history significant of Parkinson's dementia; HLD; HTN; AICD placement; CAD; and chronic systolic CHF presenting with AMS.  Her son and her brother were present throughout the evaluation.  The patient is verbal with 1-2 words and is frail and cachectic.  Family reports that she had [name] hip fracture about [name] year ago and has been non-ambulatory since.  She has been failing since with increasing difficulties with feeding, speaking.  She was last able to feed herself Massa Pe month or more ago and was able to have Sophiya Morello limited conversation about 2 weeks ago.  She was admitted for acute metabolic encephalopathy in the setting of UTI and hypernatremia.  She was admitted and started on antibiotics and IVF.  She had aspiration event on 2/24 and she was transitioned to DNR.  Palliative care had discussion with family and she's been transitioned to comfort measures.  Transfer to inpatient hospice when bed available.   Assessment & Plan:   Principal Problem:   Acute metabolic encephalopathy Active Problems:   Hyperlipidemia   Essential hypertension   Automatic implantable cardioverter-defibrillator in situ   Chronic diastolic CHF (congestive heart failure) (HCC)   AKI (acute kidney injury) ([place])   Dementia due to Parkinson's disease without behavioral disturbance (HCC)   Hypernatremia  Goals of care: transitioned to comfort measures after discussion with palliative.  Stable for transfer at this time to inpatient hospice when bed available. Continue comfort meds as indicated - tachypneic today, discussed with RN, giving morphine   Per 2/25 RN note at 0800 "Received in report from [place] that the patient's ICD was deactivated last night by the [employer] representative."  Acute metabolic encephalopathy -Patient presenting with encephalopathy as evidenced by her obtunded/unresponsiveness -not responding to voice today -suspected 2/2 UTI in setting of her dementia +/- hypernatremia -Will observe for now with IVF and abx -EEG with mild diffuse encephalopathy, nonspecific -head CT without acute abnormality  -unable to obtain MRI due to AICD -TSH wnl.  B12 and folate wnl. Ammonia wnl.  -she's been transitioned to comfort measures  Aspiration Pneumonia CXR 2/24 with LLL infiltrate c/w pneumonia Aspiration event 2/24 - transitioned to DNR and then comfort measures per palliative care conversation  Hypernatremia due to dehydration, with AKI  Hypotension Comfort measures  Dysphagia: SLP saw 2/24, recommended dysphagia 1, thin liquids via teaspoon/edge of cup, crushed meds with puree. Transitioned to comfort  Pyuria  Concern for UTI  SIRS Follow blood (ng) and urine cultures (multiple species) Continue cefepime  CXR without acute disease Tachypnea with leukocytosis, concern for UTI  Advanced Parkinson's dementia -She appears to have advanced dementia -Given concern for AMS, she does not appear to be able to swallow without aspiration risk at this time -resume sinemet, aricept, depakote for now (continue to hold zoloft for now) -Also with palliative care consultation - comfort  Malnutrition -Body mass index is 18.51 kg/m..  -The patient has at least 2 indicators for malnutrition (insufficient energy intake, weight loss, loss of muscle mass, loss of subcutaneous fat diminished functional status).  -This is likely due to starvation related/chronic disease -Will obtain [name] nutrition consult for further recommendations.  Recent COVID infection -[name] faxed notice that pt tested positive 10/08/20.  She's out of quarantine.   -positive on 2/22, but this is within 90 day window.  Can continue to follow off  precautions.  HTN -continue to hold BP meds (bp appropriate) - hold amlodipine/coreg  HLD -Hold Crestor for now  CAD/chronic systolic CHF with AICD -Hold ASA given concern for aspiration risk -Most recent echo in 08/2019 with EF 55-60% and grade 1 diastolic dysfunction -She may be at greater risk for volume overload in this circumstance and needs to be closely monitored  Thrombocytopenia -Hold AC and place SCDs -likely 2/2 acute infection (UTI) -Will follow smear  Concern for afib She's on telemetry  EKG this AM with concern for atrial fib (I think this is more likely related to fine tremor that she has).  Will continue to monitor on tele, consider repeat ekg and/or discussion with cardiology.  She's not appropriate for anticoagulation at this time with thrombocytopenia. EKG this AM with paced rhythm, follow   DVT prophylaxis: SCD's with thrombocytopenia Code Status: full  Family Communication: none at bedside (discussed with son 2/28) Disposition:   Status is: inpatient  The patient will require care spanning > 2 midnights and should be moved to inpatient because: Inpatient level of care appropriate due to severity of illness  Dispo: The patient is from: SNF              Anticipated d/c is to: SNF              Anticipated d/c date is: > 3 days              Patient currently is not medically stable to d/c.   Difficult to place patient No       Consultants:  none Procedures:  EEG  IMPRESSION: This study is suggestive of mild diffuse encephalopathy, nonspecific etiology. No seizures or epileptiform discharges were seen throughout the recording.  Per tech annotation patient was noted to have left arm twitching which was not visible on camera without concomitant eeg change and was most likely not epileptic   Antimicrobials:  Anti-infectives (From admission, onward)   Start     Dose/Rate Route Frequency Ordered Stop   11/18/20 1000  ceFEPIme (MAXIPIME) 2 g in  sodium chloride 0.9 % 100 mL IVPB  Status:  Discontinued        2 g 200 mL/hr over 30 Minutes Intravenous Every 12 hours 11/18/20 0907 11/18/20 1846   11/17/20 1400  vancomycin (VANCOREADY) IVPB 500 mg/100 mL  Status:  Discontinued        500 mg 100 mL/hr over 60 Minutes Intravenous Every 24 hours 11/16/20 1444 11/16/20 1704   11/17/20 1300  ceFEPIme (MAXIPIME) 2 g in sodium chloride 0.9 % 100 mL IVPB  Status:  Discontinued        2 g 200 mL/hr over 30 Minutes Intravenous Every 24 hours 11/16/20 1711 11/18/20 0907   11/17/20 0200  ceFEPIme (MAXIPIME) 2 g in sodium chloride 0.9 % 100 mL IVPB  Status:  Discontinued        2 g 200 mL/hr over 30 Minutes Intravenous Every 12 hours 11/16/20 1444 11/16/20 1711   11/16/20 1345  vancomycin (VANCOREADY) IVPB 1000 mg/200 mL        1,000 mg 200 mL/hr over 60 Minutes Intravenous  Once 11/16/20 1330 11/16/20 1615   11/16/20 1345  ceFEPIme (MAXIPIME) 2 g in sodium chloride 0.9 % 100 mL IVPB        2 g 200 mL/hr over 30 Minutes  Intravenous  Once 11/16/20 1331 11/16/20 1433         Subjective: Unintelligible speech   Objective: Vitals:   11/23/20 0400 11/23/20 0900 11/23/20 1416 11/23/20 1659  BP: 119/87 108/89 (!) 139/100 (!) 129/98  Pulse: 85 89 95 98  Resp: (!) 22 (!) 28 (!) 26 (!) 25  Temp: 97.7 F (36.5 C) 97.8 F (36.6 C) 98.3 F (36.8 C) 98.4 F (36.9 C)  TempSrc: Axillary Axillary Axillary Axillary  SpO2: 99% 98% 99% 97%  Weight:      Height:        Intake/Output Summary (Last 24 hours) at 11/23/2020 1735 Last data filed at 11/23/2020 1100 Gross per 24 hour  Intake 3 ml  Output --  Net 3 ml   Filed Weights   11/16/20 1318 11/17/20 1415 11/22/20 0401  Weight: 43 kg 40.2 kg 40.6 kg    Examination:  General: Appears older than stated age. Lungs: tachypneic, appears mildly uncomfortable Neurological: unintelligible speech when I talk to her today Skin: Warm and dry. No rashes or lesions. Extremities: No clubbing or  cyanosis. No edema.     Data Reviewed: I have personally reviewed following labs and imaging studies  CBC: Recent Labs  Lab 11/17/20 0258 11/18/20 0615  WBC 11.0* 6.3  NEUTROABS  --  3.7  HGB 13.6 10.6*  HCT 42.2 32.5*  MCV 91.9 89.0  PLT 65* 67*    Basic Metabolic Panel: Recent Labs  Lab 11/16/20 2030 11/17/20 0258 11/17/20 1417 11/17/20 2154 11/18/20 0615  NA 162* 161* 160* 153* 147*  K 3.5 4.3 3.2* 2.9* 2.7*  CL 129* 128* 124* 122* 118*  CO2 23 21* 25 20* 20*  GLUCOSE 117* 110* 143* 159* 164*  BUN 42* 41* 35* 34* 26*  CREATININE 0.99 0.74 0.79 0.57 0.62  CALCIUM 10.5* 10.3 10.4* 9.7 9.7  MG  --   --   --   --  2.1  PHOS  --   --   --   --  1.7*    GFR: Estimated Creatinine Clearance: 44.3 mL/min (by C-G formula based on SCr of 0.62 mg/dL).  Liver Function Tests: Recent Labs  Lab 11/18/20 0615  AST 31  ALT 48*  ALKPHOS 48  BILITOT 0.6  PROT 6.2*  ALBUMIN 2.8*    CBG: No results for input(s): GLUCAP in the last 168 hours.   Recent Results (from the past 240 hour(s))  Resp Panel by RT-PCR (Flu Alazae Crymes&B, Covid) Nasopharyngeal Swab     Status: Abnormal   Collection Time: 11/16/20  1:12 PM   Specimen: Nasopharyngeal Swab; Nasopharyngeal(NP) swabs in vial transport medium  Result Value Ref Range Status   SARS Coronavirus 2 by RT PCR POSITIVE ([place]) NEGATIVE Final    Comment: RESULT CALLED TO, READ BACK BY AND VERIFIED WITH: RN M.EINIK AT 1523 ON 11/16/2020 BY T.SAAD (NOTE) SARS-CoV-2 target nucleic acids are DETECTED.  The SARS-CoV-2 RNA is generally detectable in upper respiratory specimens during the acute phase of infection. Positive results are indicative of the presence of the identified virus, but do not rule out bacterial infection or co-infection with other pathogens not detected by the test. Clinical correlation with patient history and other diagnostic information is necessary to determine patient infection status. The expected result is  Negative.  Fact Sheet for Patients: EntrepreneurPulse.com.au  Fact Sheet for Healthcare Providers: IncredibleEmployment.be  This test is not yet approved or cleared by the [place] FDA and  has been authorized for detection and/or  diagnosis of SARS-CoV-2 by FDA under an Emergency Use Authorization (EUA).  This EUA will remain in effect (meaning this te st can be used) for the duration of  the COVID-19 declaration under Section 564(b)(1) of the Act, 21 U.S.C. section 360bbb-3(b)(1), unless the authorization is terminated or revoked sooner.     Influenza Leean Amezcua by PCR NEGATIVE NEGATIVE Final   Influenza B by PCR NEGATIVE NEGATIVE Final    Comment: (NOTE) The Xpert Xpress SARS-CoV-2/FLU/RSV plus assay is intended as an aid in the diagnosis of influenza from Nasopharyngeal swab specimens and should not be used as Landyn Buckalew sole basis for treatment. Nasal washings and aspirates are unacceptable for Xpert Xpress SARS-CoV-2/FLU/RSV testing.  Fact Sheet for Patients: EntrepreneurPulse.com.au  Fact Sheet for Healthcare Providers: IncredibleEmployment.be  This test is not yet approved or cleared by the [place] FDA and has been authorized for detection and/or diagnosis of SARS-CoV-2 by FDA under an Emergency Use Authorization (EUA). This EUA will remain in effect (meaning this test can be used) for the duration of the COVID-19 declaration under Section 564(b)(1) of the Act, 21 U.S.C. section 360bbb-3(b)(1), unless the authorization is terminated or revoked.  Performed at [facility], [place] [address], [place], [place] [address]   Blood Culture (routine x 2)     Status: None   Collection Time: 11/16/20  1:12 PM   Specimen: BLOOD RIGHT FOREARM  Result Value Ref Range Status   Specimen Description BLOOD RIGHT FOREARM  Final   Special Requests   Final    BOTTLES DRAWN AEROBIC AND ANAEROBIC Blood Culture  adequate volume   Culture   Final    NO GROWTH 5 DAYS Performed at [facility], [place] [address], [place], [place] [address]    Report Status 11/21/2020 FINAL  Final  Urine culture     Status: Abnormal   Collection Time: 11/16/20  1:12 PM   Specimen: In/Out Cath Urine  Result Value Ref Range Status   Specimen Description IN/OUT CATH URINE  Final   Special Requests   Final    NONE Performed at [facility], [place] [address], [place], [place] [address]    Culture MULTIPLE SPECIES PRESENT, SUGGEST RECOLLECTION (Magdiel Bartles)  Final   Report Status 11/18/2020 FINAL  Final  Blood Culture (routine x 2)     Status: None   Collection Time: 11/16/20  1:17 PM   Specimen: BLOOD RIGHT WRIST  Result Value Ref Range Status   Specimen Description BLOOD RIGHT WRIST  Final   Special Requests   Final    BOTTLES DRAWN AEROBIC AND ANAEROBIC Blood Culture adequate volume   Culture   Final    NO GROWTH 5 DAYS Performed at [facility], [place] [address] [place], [place] [address]    Report Status 11/21/2020 FINAL  Final  MRSA PCR Screening     Status: None   Collection Time: 11/17/20 11:39 PM   Specimen: Nasal Mucosa; Nasopharyngeal  Result Value Ref Range Status   MRSA by PCR NEGATIVE NEGATIVE Final    Comment:        The GeneXpert MRSA Assay (FDA approved for NASAL specimens only), is one component of Dawson Albers comprehensive MRSA colonization surveillance program. It is not intended to diagnose MRSA infection nor to guide or monitor treatment for MRSA infections. Performed at [facility], [place] [address], [place], [place] [address]          Radiology Studies: No results found.  Scheduled Meds: . Chlorhexidine Gluconate Cloth  6 each Topical Q0600  . mouth rinse  15 mL Mouth Rinse BID  . sodium chloride flush  3 mL Intravenous Q12H   Continuous Infusions:    LOS: 6 days    Time spent: over 30 min    Fayrene Helper, MD [facility]   To contact the  attending provider between 7A-7P or the covering provider during after hours 7P-7A, please log into the web site [email or web address] and access using universal [place] password for that web site. If you do not have the password, please call the hospital operator.  11/23/2020, 5:35 PM

## 2020-11-24 DIAGNOSIS — I5032 Chronic diastolic (congestive) heart failure: Secondary | ICD-10-CM | POA: Diagnosis not present

## 2020-11-24 DIAGNOSIS — N179 Acute kidney failure, unspecified: Secondary | ICD-10-CM | POA: Diagnosis not present

## 2020-11-24 DIAGNOSIS — G20 Parkinson's disease: Secondary | ICD-10-CM | POA: Diagnosis not present

## 2020-11-24 DIAGNOSIS — G9341 Metabolic encephalopathy: Secondary | ICD-10-CM | POA: Diagnosis not present

## 2020-11-24 MED ORDER — POLYVINYL ALCOHOL 1.4 % OP SOLN
1.0000 [drp] | OPHTHALMIC | Status: DC
  Administered 2020-11-24 – 2020-11-26 (×14): 1 [drp] via OPHTHALMIC
  Filled 2020-11-24: qty 15

## 2020-11-24 NOTE — TOC Progression Note (Signed)
Transition of Care [facility]) - Progression Note    Patient Details  Name: CARTIER MAPEL MRN: [id number] Date of Birth: 1956-06-02  Transition of [facility]) CM/SW [place], LCSW Phone Number: 11/24/2020, 9:13 AM  Clinical Narrative:    CSW left voicemail for patient's son, Chistopher, to provide an update.    Expected Discharge Plan: [place] Barriers to Discharge: Hospice Bed not available  Expected Discharge Plan and Services Expected Discharge Plan: [place] In-house Referral: Clinical Social Work,Hospice / [place] Acute Care Choice: Hospice Living arrangements for the past 2 months: [place]: Hospice and [place] Date [place]: 11/19/20 Time [place]: 1003 Representative spoke with at [place]: [place] ([place]) Interventions    Readmission Risk Interventions Readmission Risk Prevention Plan 01/02/2020 12/25/2019  Transportation Screening Complete Complete  PCP or Specialist Appt within 5-7 Days Complete Complete  Home Care Screening Complete Complete  Medication Review (RN CM) Complete Complete  Some recent data might be hidden

## 2020-11-24 NOTE — Progress Notes (Signed)
Daily Progress Note   Patient Name: [name]       Date: 11/24/2020 DOB: 1955-12-03  Age: 65 y.o. MRN#: [id number] Attending Physician: Jonetta Osgood, MD Primary Care Physician: Lujean Amel, MD Admit Date: 11/16/2020  Reason for Follow-up: end of life care, symptom check  Subjective: Patient appears comfortable. She is lethargic but will verbally respond to voice. When I asked if she could hear me, she states "I can hear you very well". She denies pain. No non-verbal signs of pain or discomfort noted. Respirations are even and unlabored. No excessive respiratory secretions noted. Here eyes are partially closed; but I do note bilateral scleral edema and eryhthema. No family at bedside currently.    I spoke with the hospice liaison - she states there may possibly be a bed available this afternoon.  12:15--I spoke with brother/[name] by phone. He shares that patient was interactive with family members during a recent visit and now several members of his family, including his daughter (patient's niece), are expressing that patient can "recover from this". He states these family members are expressing to him and patient's son that they "gave up too early" and speaking negatively about their decision to transition to comfort care.  I provided education and counseling on the nautral trajectory of Parkinson's dementia, emphasizing that she has significant dysphagia and aspiration risk, which indicates end-stage of the disease process. Discussed at length difference between life-prolonging measures versus promoting quality of life. Education and counseling provided on expectations at EOL. Emotional support provided.  Okey Dupre expresses appreciation for the call. I offered to speak with any family  members that needed support and/or education.     Length of Stay: 7  Current Medications: Scheduled Meds:  . Chlorhexidine Gluconate Cloth  6 each Topical Q0600  . mouth rinse  15 mL Mouth Rinse BID  . sodium chloride flush  3 mL Intravenous Q12H    Continuous Infusions:   PRN Meds: acetaminophen **OR** acetaminophen, albuterol, antiseptic oral rinse, bisacodyl, glycopyrrolate **OR** glycopyrrolate, hydroxypropyl methylcellulose / hypromellose, LORazepam, morphine injection, ondansetron **OR** ondansetron (ZOFRAN) IV, polyvinyl alcohol    Vital Signs: BP (!) 145/101 (BP Location: Left Arm)   Pulse 91   Temp 98.4 F (36.9 C) (Axillary)   Resp (!) 22   Ht 5' (1.524 m)   Wt  40.6 kg   SpO2 97%   BMI 17.48 kg/m  SpO2: SpO2: 97 % O2 Device: O2 Device: Room Air  Intake/output summary:   Intake/Output Summary (Last 24 hours) at 11/24/2020 1059 Last data filed at 11/24/2020 9030 Gross per 24 hour  Intake 3 ml  Output 550 ml  Net -547 ml   LBM: Last BM Date: 11/18/20 Baseline Weight: Weight: 43 kg Most recent weight: Weight: 40.6 kg       Palliative Assessment/Data: PPS 10%        Palliative Care Assessment & Plan   HPI/Patient Profile:64 y.o.femalewith past medical history of Parkinson's dementia, hypertension, hyperlipidemia, AICD placement, CAD, and chronic systolic CHF who presented to the emergency departmenton 2/22/2022with altered mental status.Per intake H&P, patient is verbal with 1-2 words and is frail and cachectic. Family reports she had a hip fracture about a year ago and has been non-ambulatory since. She was COVID positive 2 weeks ago.  ED Course:Reportedly more confused this morning, possible aspiration event. Febrile to 101.5, urinalysis suspicious for UTI but no evidence of sepsis. Sodium 163. Given IV fluid and tylenol. Family considering change in code status. Neurology recommending EEG.   Assessment: - acute metabolic encephalopathy -  chronic diastolic CHF - AICD present - pyuria, concern for UTI - advanced Parkinson's dementia - recent COVID infection - hypernatremia due to dehydration - malnutrition - AKI, improved - dysphagia - acute hypoxic respiratory failure secondary to aspiration event   Recommendations/Plan:  Continue full comfort measures  DNR/DNI as previously documented   Transfer to hospice facility in [facility] when bed becomes available  Nursing to administer PRM medications as clinically appropriate to ensure EOL comfort  Polyvinyl alcohol (LIQUIFILM TEARS) eye drops scheduled every 4 hours - for scleral edema/irritation  AICD was deactivated on 2/24 by representative from [facility]  PMT will continue to follow  Goals of Care and Additional Recommendations:  Limitations on Scope of Treatment: Full Comfort Care  Code Status: DNR/DNI  Prognosis:   < 2 weeks  Discharge Planning:  Hospice facility   Thank you for allowing the Palliative Medicine Team to assist in the care of this patient.   Total Time 25 minutes Prolonged Time Billed  no       Greater than 50%  of this time was spent counseling and coordinating care related to the above assessment and plan.  Lavena Bullion, NP  Please contact Palliative Medicine Team phone at [phone] for questions and concerns.

## 2020-11-24 NOTE — Progress Notes (Signed)
This chaplain is present bedside for F/U spiritual care.  The chaplain sits in silence with the Pt. and observes the peace in her breath.  The chaplain shares prayer with the Pt. before leaving her bedside.

## 2020-11-24 NOTE — Progress Notes (Addendum)
PROGRESS NOTE        PATIENT DETAILS Name: [name] Age: 65 y.o. Sex: female Date of Birth: [birth year] Admit Date: 11/16/2020 Admitting Physician A Melven Sartorius., MD VOZ:DGUYQIH, Dibas, MD  Brief Narrative: Patient is a 66 y.o. female Parkinson's disease, dementia, HLD, HTN, CAD, chronic systolic heart failure, AICD placement-with ongoing severe failure to thrive syndrome-admitted for evaluation of acute metabolic encephalopathy in the setting of UTI and hyponatremia.  She was started on antibiotics and IV fluids.  She had a aspiration event on 2/24-after extensive discussion with family-she was subsequently transitioned to comfort measures.   Subjective: Unresponsive-but comfortable.  Assessment/Plan: Acute metabolic encephalopathy in the setting of UTI and hyponatremia Aspiration pneumonia Hyponatremia due to dehydration AKI likely hemodynamically mediated Dysphagia Chronic systolic heart failure s/p ICD placement Thrombocytopenia Normocytic anemia Advanced Parkinson's dementia Severe protein calorie malnutrition Recent COVID-19 infection   Continue full comfort measures-awaiting residential hospice bed.  We will continue to optimize comfort care medications while awaiting residential hospice bed.    Diet: Diet Order            Diet NPO time specified  Diet effective now                  Code Status: DNR  Family Communication: None at bedside-palliative care following  Disposition Plan: Status is: Inpatient  Remains inpatient appropriate because:Inpatient level of care appropriate due to severity of illness   Dispo: The patient is from: SNF              Anticipated d/c is to: Residential Hospice              Patient currently is medically stable to d/c.   Difficult to place patient No   Barriers to Discharge: Awaiting residential hospice placement  Antimicrobial agents: Anti-infectives (From admission, onward)    Start     Dose/Rate Route Frequency Ordered Stop   11/18/20 1000  ceFEPIme (MAXIPIME) 2 g in sodium chloride 0.9 % 100 mL IVPB  Status:  Discontinued        2 g 200 mL/hr over 30 Minutes Intravenous Every 12 hours 11/18/20 0907 11/18/20 1846   11/17/20 1400  vancomycin (VANCOREADY) IVPB 500 mg/100 mL  Status:  Discontinued        500 mg 100 mL/hr over 60 Minutes Intravenous Every 24 hours 11/16/20 1444 11/16/20 1704   11/17/20 1300  ceFEPIme (MAXIPIME) 2 g in sodium chloride 0.9 % 100 mL IVPB  Status:  Discontinued        2 g 200 mL/hr over 30 Minutes Intravenous Every 24 hours 11/16/20 1711 11/18/20 0907   11/17/20 0200  ceFEPIme (MAXIPIME) 2 g in sodium chloride 0.9 % 100 mL IVPB  Status:  Discontinued        2 g 200 mL/hr over 30 Minutes Intravenous Every 12 hours 11/16/20 1444 11/16/20 1711   11/16/20 1345  vancomycin (VANCOREADY) IVPB 1000 mg/200 mL        1,000 mg 200 mL/hr over 60 Minutes Intravenous  Once 11/16/20 1330 11/16/20 1615   11/16/20 1345  ceFEPIme (MAXIPIME) 2 g in sodium chloride 0.9 % 100 mL IVPB        2 g 200 mL/hr over 30 Minutes Intravenous  Once 11/16/20 1331 11/16/20 1433       Time spent: 15 minutes-Greater  than 50% of this time was spent in counseling, explanation of diagnosis, planning of further management, and coordination of care.  MEDICATIONS: Scheduled Meds: . Chlorhexidine Gluconate Cloth  6 each Topical Q0600  . mouth rinse  15 mL Mouth Rinse BID  . polyvinyl alcohol  1 drop Both Eyes Q4H  . sodium chloride flush  3 mL Intravenous Q12H   Continuous Infusions: PRN Meds:.acetaminophen **OR** acetaminophen, albuterol, antiseptic oral rinse, bisacodyl, glycopyrrolate **OR** glycopyrrolate, hydroxypropyl methylcellulose / hypromellose, LORazepam, morphine injection, ondansetron **OR** ondansetron (ZOFRAN) IV   PHYSICAL EXAM: Vital signs: Vitals:   11/23/20 1416 11/23/20 1659 11/23/20 1955 11/24/20 0900  BP: (!) 139/100 (!) 129/98 (!) 145/101  (!) 151/106  Pulse: 95 98 91 80  Resp: (!) 26 (!) 25 (!) 22 20  Temp: 98.3 F (36.8 C) 98.4 F (36.9 C) 98.4 F (36.9 C) 97.6 F (36.4 C)  TempSrc: Axillary Axillary Axillary Axillary  SpO2: 99% 97% 97% 100%  Weight:      Height:       Filed Weights   11/16/20 1318 11/17/20 1415 11/22/20 0401  Weight: 43 kg 40.2 kg 40.6 kg   Body mass index is 17.48 kg/m.   Appears comfortable-not in any distress.  I have personally reviewed following labs and imaging studies  LABORATORY DATA: CBC: Recent Labs  Lab 11/18/20 0615  WBC 6.3  NEUTROABS 3.7  HGB 10.6*  HCT 32.5*  MCV 89.0  PLT 67*    Basic Metabolic Panel: Recent Labs  Lab 11/17/20 1417 11/17/20 2154 11/18/20 0615  NA 160* 153* 147*  K 3.2* 2.9* 2.7*  CL 124* 122* 118*  CO2 25 20* 20*  GLUCOSE 143* 159* 164*  BUN 35* 34* 26*  CREATININE 0.79 0.57 0.62  CALCIUM 10.4* 9.7 9.7  MG  --   --  2.1  PHOS  --   --  1.7*    GFR: Estimated Creatinine Clearance: 44.3 mL/min (by C-G formula based on SCr of 0.62 mg/dL).  Liver Function Tests: Recent Labs  Lab 11/18/20 0615  AST 31  ALT 48*  ALKPHOS 48  BILITOT 0.6  PROT 6.2*  ALBUMIN 2.8*   No results for input(s): LIPASE, AMYLASE in the last 168 hours. Recent Labs  Lab 11/17/20 1417  AMMONIA 17    Coagulation Profile: No results for input(s): INR, PROTIME in the last 168 hours.  Cardiac Enzymes: No results for input(s): CKTOTAL, CKMB, CKMBINDEX, TROPONINI in the last 168 hours.  BNP (last 3 results) No results for input(s): PROBNP in the last 8760 hours.  Lipid Profile: No results for input(s): CHOL, HDL, LDLCALC, TRIG, CHOLHDL, LDLDIRECT in the last 72 hours.  Thyroid Function Tests: No results for input(s): TSH, T4TOTAL, FREET4, T3FREE, THYROIDAB in the last 72 hours.  Anemia Panel: No results for input(s): VITAMINB12, FOLATE, FERRITIN, TIBC, IRON, RETICCTPCT in the last 72 hours.  Urine analysis:    Component Value Date/Time    COLORURINE AMBER (A) 11/16/2020 1312   APPEARANCEUR TURBID (A) 11/16/2020 1312   LABSPEC 1.027 11/16/2020 1312   PHURINE 5.0 11/16/2020 1312   GLUCOSEU NEGATIVE 11/16/2020 1312   HGBUR NEGATIVE 11/16/2020 1312   BILIRUBINUR NEGATIVE 11/16/2020 1312   KETONESUR NEGATIVE 11/16/2020 1312   PROTEINUR 100 (A) 11/16/2020 1312   NITRITE NEGATIVE 11/16/2020 1312   LEUKOCYTESUR LARGE (A) 11/16/2020 1312    Sepsis Labs: Lactic Acid, Venous    Component Value Date/Time   LATICACIDVEN 1.9 11/16/2020 1312    MICROBIOLOGY: Recent Results (from the  past 240 hour(s))  Resp Panel by RT-PCR (Flu A&B, Covid) Nasopharyngeal Swab     Status: Abnormal   Collection Time: 11/16/20  1:12 PM   Specimen: Nasopharyngeal Swab; Nasopharyngeal(NP) swabs in vial transport medium  Result Value Ref Range Status   SARS Coronavirus 2 by RT PCR POSITIVE (A) NEGATIVE Final    Comment: RESULT CALLED TO, READ BACK BY AND VERIFIED WITH: RN M.EINIK AT 1523 ON 11/16/2020 BY T.SAAD (NOTE) SARS-CoV-2 target nucleic acids are DETECTED.  The SARS-CoV-2 RNA is generally detectable in upper respiratory specimens during the acute phase of infection. Positive results are indicative of the presence of the identified virus, but do not rule out bacterial infection or co-infection with other pathogens not detected by the test. Clinical correlation with patient history and other diagnostic information is necessary to determine patient infection status. The expected result is Negative.  Fact Sheet for Patients: EntrepreneurPulse.com.au  Fact Sheet for Healthcare Providers: IncredibleEmployment.be  This test is not yet approved or cleared by the [place] FDA and  has been authorized for detection and/or diagnosis of SARS-CoV-2 by FDA under an Emergency Use Authorization (EUA).  This EUA will remain in effect (meaning this te st can be used) for the duration of  the COVID-19  declaration under Section 564(b)(1) of the Act, 21 U.S.C. section 360bbb-3(b)(1), unless the authorization is terminated or revoked sooner.     Influenza A by PCR NEGATIVE NEGATIVE Final   Influenza B by PCR NEGATIVE NEGATIVE Final    Comment: (NOTE) The Xpert Xpress SARS-CoV-2/FLU/RSV plus assay is intended as an aid in the diagnosis of influenza from Nasopharyngeal swab specimens and should not be used as a sole basis for treatment. Nasal washings and aspirates are unacceptable for Xpert Xpress SARS-CoV-2/FLU/RSV testing.  Fact Sheet for Patients: EntrepreneurPulse.com.au  Fact Sheet for Healthcare Providers: IncredibleEmployment.be  This test is not yet approved or cleared by the [place] FDA and has been authorized for detection and/or diagnosis of SARS-CoV-2 by FDA under an Emergency Use Authorization (EUA). This EUA will remain in effect (meaning this test can be used) for the duration of the COVID-19 declaration under Section 564(b)(1) of the Act, 21 U.S.C. section 360bbb-3(b)(1), unless the authorization is terminated or revoked.  Performed at [facility], [place] [address] [place], [place] [address]   Blood Culture (routine x 2)     Status: None   Collection Time: 11/16/20  1:12 PM   Specimen: BLOOD RIGHT FOREARM  Result Value Ref Range Status   Specimen Description BLOOD RIGHT FOREARM  Final   Special Requests   Final    BOTTLES DRAWN AEROBIC AND ANAEROBIC Blood Culture adequate volume   Culture   Final    NO GROWTH 5 DAYS Performed at [facility], [place] [address], [place], [place] [address]    Report Status 11/21/2020 FINAL  Final  Urine culture     Status: Abnormal   Collection Time: 11/16/20  1:12 PM   Specimen: In/Out Cath Urine  Result Value Ref Range Status   Specimen Description IN/OUT CATH URINE  Final   Special Requests   Final    NONE Performed at [facility], [place] [address],  [place], [place] [address]    Culture MULTIPLE SPECIES PRESENT, SUGGEST RECOLLECTION (A)  Final   Report Status 11/18/2020 FINAL  Final  Blood Culture (routine x 2)     Status: None   Collection Time: 11/16/20  1:17 PM  Specimen: BLOOD RIGHT WRIST  Result Value Ref Range Status   Specimen Description BLOOD RIGHT WRIST  Final   Special Requests   Final    BOTTLES DRAWN AEROBIC AND ANAEROBIC Blood Culture adequate volume   Culture   Final    NO GROWTH 5 DAYS Performed at [facility], [address] [place], [place] [address]    Report Status 11/21/2020 FINAL  Final  MRSA PCR Screening     Status: None   Collection Time: 11/17/20 11:39 PM   Specimen: Nasal Mucosa; Nasopharyngeal  Result Value Ref Range Status   MRSA by PCR NEGATIVE NEGATIVE Final    Comment:        The GeneXpert MRSA Assay (FDA approved for NASAL specimens only), is one component of a comprehensive MRSA colonization surveillance program. It is not intended to diagnose MRSA infection nor to guide or monitor treatment for MRSA infections. Performed at [facility], [place] [address], [place], [place] [address]     RADIOLOGY STUDIES/RESULTS: No results found.   LOS: 7 days   Oren Binet, MD  [facility]    To contact the attending provider between 7A-7P or the covering provider during after hours 7P-7A, please log into the web site [email or web address] and access using universal [place] password for that web site. If you do not have the password, please call the hospital operator.  11/24/2020, 12:18 PM

## 2020-11-25 DIAGNOSIS — G9341 Metabolic encephalopathy: Secondary | ICD-10-CM | POA: Diagnosis not present

## 2020-11-25 DIAGNOSIS — R4182 Altered mental status, unspecified: Secondary | ICD-10-CM | POA: Diagnosis not present

## 2020-11-25 DIAGNOSIS — Z95810 Presence of automatic (implantable) cardiac defibrillator: Secondary | ICD-10-CM | POA: Diagnosis not present

## 2020-11-25 DIAGNOSIS — N179 Acute kidney failure, unspecified: Secondary | ICD-10-CM | POA: Diagnosis not present

## 2020-11-25 DIAGNOSIS — R0602 Shortness of breath: Secondary | ICD-10-CM

## 2020-11-25 NOTE — Progress Notes (Signed)
Daily Progress Note   Patient Name: [name]       Date: 11/25/2020 DOB: 1956/04/11  Age: 65 y.o. MRN#: [id number] Attending Physician: Jonetta Osgood, MD Primary Care Physician: Lujean Amel, MD Admit Date: 11/16/2020  Reason for Consultation/Follow-up: Non pain symptom management, Pain control, Psychosocial/spiritual support and Terminal Care  Subjective: Chart review performed. Received report from primary RN - no acute concerns. Patient is not eating or drinking, remains lethargic.  Went to visit patient at bedside - no family/visitors present. Patient is lying in bed asleep - she does open her eyes partially to voice/gentle touch. I ask if she's in pain - she tries to respond but it is mumbled and unintelligible and quickly falls back asleep. No signs or non-verbal gestures of pain or discomfort noted. No respiratory distress, increased work of breathing, or secretions noted. She is frail appearing. She is on room air.  Length of Stay: 8  Current Medications: Scheduled Meds:  . Chlorhexidine Gluconate Cloth  6 each Topical Q0600  . mouth rinse  15 mL Mouth Rinse BID  . polyvinyl alcohol  1 drop Both Eyes Q4H  . sodium chloride flush  3 mL Intravenous Q12H    Continuous Infusions:   PRN Meds: acetaminophen **OR** acetaminophen, albuterol, antiseptic oral rinse, bisacodyl, glycopyrrolate **OR** glycopyrrolate, hydroxypropyl methylcellulose / hypromellose, LORazepam, morphine injection, ondansetron **OR** ondansetron (ZOFRAN) IV  Physical Exam Vitals and nursing note reviewed.  Constitutional:      General: She is not in acute distress.    Appearance: She is cachectic. She is ill-appearing.     Comments: Frail appearing  Pulmonary:     Effort: No respiratory distress.   Skin:    General: Skin is warm and dry.  Neurological:     Mental Status: She is lethargic.     Motor: Weakness present.  Psychiatric:        Speech: She is noncommunicative.        Cognition and Memory: Cognition is impaired. Memory is impaired.             Vital Signs: BP (!) 154/105 (BP Location: Left Arm)   Pulse 93   Temp 97.7 F (36.5 C) (Axillary)   Resp 14   Ht 5' (1.524 m)   Wt 40.6 kg   SpO2 96%  BMI 17.48 kg/m  SpO2: SpO2: 96 % O2 Device: O2 Device: Room Air O2 Flow Rate: O2 Flow Rate (L/min): 5 L/min  Intake/output summary:   Intake/Output Summary (Last 24 hours) at 11/25/2020 1434 Last data filed at 11/25/2020 0402 Gross per 24 hour  Intake 150 ml  Output --  Net 150 ml   LBM: Last BM Date: 11/24/20 Baseline Weight: Weight: 43 kg Most recent weight: Weight: 40.6 kg       Palliative Assessment/Data: PPS 10%    Flowsheet Rows   Flowsheet Row Most Recent Value  Intake Tab   Referral Department Hospitalist  Unit at Time of Referral ER  Date Notified 11/16/20  Palliative Care Type Return patient Palliative Care  Reason for referral Clarify Goals of Care  Date of Admission 11/16/20  Date first seen by Palliative Care 11/16/20  # of days Palliative referral response time 0 Day(s)  # of days IP prior to Palliative referral 0  Clinical Assessment   Psychosocial & Spiritual Assessment   Palliative Care Outcomes       Patient Active Problem List   Diagnosis Date Noted  . Hypernatremia 11/17/2020  . Acute metabolic encephalopathy 63/84/6659  . Malnutrition of moderate degree 04/15/2020  . Hypercalcemia 04/14/2020  . Dehydration, severe 04/13/2020  . Dehydration 04/13/2020  . Acute hypernatremia   . Failure to thrive in adult   . Closed left hip fracture, initial encounter ([place]) 12/30/2019  . Frequent falls   . Orthostatic hypotension 12/23/2019  . Syncopal episodes 12/22/2019  . Dementia due to Parkinson's disease without behavioral  disturbance ([place]) 12/22/2019  . Renal insufficiency 12/22/2019  . Near syncope 09/04/2019  . AKI (acute kidney injury) ([place]) 09/04/2019  . Hypokalemia 09/04/2019  . Depression 09/04/2019  . Dementia ([place]) 09/04/2019  . Thrombocytopenia ([place]) 09/04/2019  . Low back pain 06/25/2019  . S/P AVR (aortic valve replacement) 12/12/2016  . Aortic stenosis 11/16/2016  . Severe aortic stenosis   . Polycythemia 06/06/2016  . Menopausal state 02/16/2016  . Hyperparathyroidism, primary ([place]) 02/16/2016  . Chronic diastolic CHF (congestive heart failure) ([place]) 05/07/2014  . Hx of colonic polyps 06/17/2013  . Ventricular tachycardia ([place]) 02/21/2011  . Automatic implantable cardioverter-defibrillator in situ 06/02/2009  . Hyperlipidemia 05/29/2009  . SMOKER 05/29/2009  . Essential hypertension 05/29/2009  . Aortic valve disorder 05/29/2009  . CARDIOMYOPATHY, SECONDARY 05/29/2009  . CHF 05/29/2009  . BICUSPID AORTIC VALVE 05/29/2009  . LIVER FUNCTION TESTS, ABNORMAL, HX OF 05/29/2009  . CHOLECYSTITIS, HX OF 05/29/2009    Palliative Care Assessment & Plan   Patient Profile: 65 y.o.femalewith past medical history of Parkinson's dementia, hypertension, hyperlipidemia, AICD placement, CAD, and chronic systolic CHF who presented to the emergency departmenton 2/22/2022with altered mental status.Per intake H&P, patient is verbal with 1-2 words and is frail and cachectic. Family reports she had a hip fracture about a year ago and has been non-ambulatory since. She was COVID positive 2 weeks ago.  ED Course:Reportedly more confused this morning, possible aspiration event. Febrile to 101.5, urinalysis suspicious for UTI but no evidence of sepsis. Sodium 163. Given IV fluid and tylenol. Family considering change in code status. Neurology recommending EEG.   Assessment: Acute metabolic encephalopathy Hypernatremia Dehydration AKI Dysphagia Advanced Parkinson's dementia Recent COVID19  infection Chronic diastolic CHF Terminal care  Recommendations/Plan:  Continue full comfort measures - prognosis likely days  Continue DNR/DNI as previously documented  Transfer to [employer] (now that patient is post 10 day COVID positive result) or [facility] when bed available  AICD was deactivated on 2/24 by representative from [employer] current comfort focused medication regimen - patient appears comfortable  Nursing toadminister PRN medications as clinically necessary to ensure EOL comfort  PMT will continue to follow and support holistically  Goals of Care and Additional Recommendations:  Limitations on Scope of Treatment: Full Comfort Care  Code Status:    Code Status Orders  (From admission, onward)         Start     Ordered   11/18/20 1847  Do not attempt resuscitation (DNR)  Continuous       Question Answer Comment  In the event of cardiac or respiratory ARREST Do not call a "code blue"   In the event of cardiac or respiratory ARREST Do not perform Intubation, CPR, defibrillation or ACLS   In the event of cardiac or respiratory ARREST Use medication by any route, position, wound care, and other measures to relive pain and suffering. May use oxygen, suction and manual treatment of airway obstruction as needed for comfort.      11/18/20 1850        Code Status History    Date Active Date Inactive Code Status Order ID Comments User Context   11/18/2020 1643 11/18/2020 1850 DNR [id number]  Elodia Florence., MD Inpatient   11/16/2020 1704 11/18/2020 1642 Full Code [id number]  Karmen Bongo, MD ED   04/13/2020 1839 05/29/2020 0006 Full Code [id number]  Lequita Halt, MD ED   12/30/2019 0739 01/02/2020 2332 Full Code [id number]  Vianne Bulls, MD Inpatient   12/22/2019 1504 12/26/2019 2340 Full Code [id number]  Norval Morton, MD ED   09/04/2019 2342 09/05/2019 2141 Full Code [id number]  Lenore Cordia, MD ED   11/16/2016 1245  11/22/2016 1543 Full Code [id number]  Nani Skillern, PA-C Inpatient   07/05/2016 1140 07/05/2016 1744 Full Code [id number]  Burnell Blanks, MD Inpatient   Advance Care Planning Activity       Prognosis:   < 2 weeks  Discharge Planning:  Hospice facility  Care plan was discussed with primary RN  Thank you for allowing the Palliative Medicine Team to assist in the care of this patient.   Total Time 15 minutes Prolonged Time Billed  no       Greater than 50%  of this time was spent counseling and coordinating care related to the above assessment and plan.  Lin Landsman, NP  Please contact Palliative Medicine Team phone at [phone] for questions and concerns.

## 2020-11-25 NOTE — Progress Notes (Signed)
   We continue to follow and support family at this time while we wait an opening at our Hospice home in [facility]. We currently do not have a bed at this time to offer but if something opens up later today we will be happy to reach out to Transitions of care team to update and proceed with moving forward with d/c plan   Webb Silversmith RN [phone]

## 2020-11-25 NOTE — TOC Progression Note (Signed)
Transition of Care [facility]) - Progression Note    Patient Details  Name: [name] MRN: [id number] Date of Birth: 03-21-1956  Transition of Care [facility]) CM/SW [place], LCSW Phone Number: 11/25/2020, 2:30 PM  Clinical Narrative:    CSW notes [place] will also follow patient since she will be ten days past COVID + (2/22) tomorrow.    Expected Discharge Plan: [place] Barriers to Discharge: Hospice Bed not available  Expected Discharge Plan and Services Expected Discharge Plan: [place] In-house Referral: Clinical Social Work,Hospice / [place] Acute Care Choice: Hospice Living arrangements for the past 2 months: [place]: Hospice and [place] Date [place]: 11/19/20 Time [place]: 1003 Representative spoke with at [place]: [place] ([place]) Interventions    Readmission Risk Interventions Readmission Risk Prevention Plan 01/02/2020 12/25/2019  Transportation Screening Complete Complete  PCP or Specialist Appt within 5-7 Days Complete Complete  Home Care Screening Complete Complete  Medication Review (RN CM) Complete Complete  Some recent data might be hidden

## 2020-11-25 NOTE — Progress Notes (Signed)
PROGRESS NOTE        PATIENT DETAILS Name: [name] Age: 65 y.o. Sex: female Date of Birth: 1956/09/11 Admit Date: 11/16/2020 Admitting Physician A Melven Sartorius., MD FGB:[id number], [place], MD  Brief Narrative: Patient is a 65 y.o. female Parkinson's disease, dementia, HLD, HTN, CAD, chronic systolic heart failure, AICD placement-with ongoing severe failure to thrive syndrome-admitted for evaluation of acute metabolic encephalopathy in the setting of UTI and hyponatremia.  She was started on antibiotics and IV fluids.  She had a aspiration event on 2/24-after extensive discussion with family-she was subsequently transitioned to comfort measures.   Subjective: Lethargic but more awake/alert compared to yesterday.  Assessment/Plan: Acute metabolic encephalopathy in the setting of UTI and hyponatremia Aspiration pneumonia Hyponatremia due to dehydration AKI likely hemodynamically mediated Dysphagia Chronic systolic heart failure s/p ICD placement Thrombocytopenia Normocytic anemia Advanced Parkinson's dementia Severe protein calorie malnutrition Recent COVID-19 infection   She remains comfortable-on the full comfort measures-palliative care team following-awaiting residential hospice bed.    Diet: Diet Order            Diet NPO time specified  Diet effective now                  Code Status: DNR  Family Communication: None at bedside-palliative care plans to reach out to family again later today.  Disposition Plan: Status is: Inpatient  Remains inpatient appropriate because:Inpatient level of care appropriate due to severity of illness   Dispo: The patient is from: SNF              Anticipated d/c is to: Residential Hospice              Patient currently is medically stable to d/c.   Difficult to place patient No   Barriers to Discharge: Awaiting residential hospice placement  Antimicrobial agents: Anti-infectives (From  admission, onward)   Start     Dose/Rate Route Frequency Ordered Stop   11/18/20 1000  ceFEPIme (MAXIPIME) 2 g in sodium chloride 0.9 % 100 mL IVPB  Status:  Discontinued        2 g 200 mL/hr over 30 Minutes Intravenous Every 12 hours 11/18/20 0907 11/18/20 1846   11/17/20 1400  vancomycin (VANCOREADY) IVPB 500 mg/100 mL  Status:  Discontinued        500 mg 100 mL/hr over 60 Minutes Intravenous Every 24 hours 11/16/20 1444 11/16/20 1704   11/17/20 1300  ceFEPIme (MAXIPIME) 2 g in sodium chloride 0.9 % 100 mL IVPB  Status:  Discontinued        2 g 200 mL/hr over 30 Minutes Intravenous Every 24 hours 11/16/20 1711 11/18/20 0907   11/17/20 0200  ceFEPIme (MAXIPIME) 2 g in sodium chloride 0.9 % 100 mL IVPB  Status:  Discontinued        2 g 200 mL/hr over 30 Minutes Intravenous Every 12 hours 11/16/20 1444 11/16/20 1711   11/16/20 1345  vancomycin (VANCOREADY) IVPB 1000 mg/200 mL        1,000 mg 200 mL/hr over 60 Minutes Intravenous  Once 11/16/20 1330 11/16/20 1615   11/16/20 1345  ceFEPIme (MAXIPIME) 2 g in sodium chloride 0.9 % 100 mL IVPB        2 g 200 mL/hr over 30 Minutes Intravenous  Once 11/16/20 1331 11/16/20 1433  Time spent: 15 minutes-Greater than 50% of this time was spent in counseling, explanation of diagnosis, planning of further management, and coordination of care.  MEDICATIONS: Scheduled Meds: . Chlorhexidine Gluconate Cloth  6 each Topical Q0600  . mouth rinse  15 mL Mouth Rinse BID  . polyvinyl alcohol  1 drop Both Eyes Q4H  . sodium chloride flush  3 mL Intravenous Q12H   Continuous Infusions: PRN Meds:.acetaminophen **OR** acetaminophen, albuterol, antiseptic oral rinse, bisacodyl, glycopyrrolate **OR** glycopyrrolate, hydroxypropyl methylcellulose / hypromellose, LORazepam, morphine injection, ondansetron **OR** ondansetron (ZOFRAN) IV   PHYSICAL EXAM: Vital signs: Vitals:   11/23/20 1955 11/24/20 0900 11/24/20 1332 11/24/20 2333  BP: (!) 145/101 (!)  151/106  (!) 133/97  Pulse: 91 80  75  Resp: (!) 22 20 (!) 22 20  Temp: 98.4 F (36.9 C) 97.6 F (36.4 C)  (!) 97.5 F (36.4 C)  TempSrc: Axillary Axillary  Axillary  SpO2: 97% 100%  99%  Weight:      Height:       Filed Weights   11/16/20 1318 11/17/20 1415 11/22/20 0401  Weight: 43 kg 40.2 kg 40.6 kg   Body mass index is 17.48 kg/m.   Appears comfortable-not in any distress.  I have personally reviewed following labs and imaging studies  LABORATORY DATA: CBC: No results for input(s): WBC, NEUTROABS, HGB, HCT, MCV, PLT in the last 168 hours.  Basic Metabolic Panel: No results for input(s): NA, K, CL, CO2, GLUCOSE, BUN, CREATININE, CALCIUM, MG, PHOS in the last 168 hours.  GFR: Estimated Creatinine Clearance: 44.3 mL/min (by C-G formula based on SCr of 0.62 mg/dL).  Liver Function Tests: No results for input(s): AST, ALT, ALKPHOS, BILITOT, PROT, ALBUMIN in the last 168 hours. No results for input(s): LIPASE, AMYLASE in the last 168 hours. No results for input(s): AMMONIA in the last 168 hours.  Coagulation Profile: No results for input(s): INR, PROTIME in the last 168 hours.  Cardiac Enzymes: No results for input(s): CKTOTAL, CKMB, CKMBINDEX, TROPONINI in the last 168 hours.  BNP (last 3 results) No results for input(s): PROBNP in the last 8760 hours.  Lipid Profile: No results for input(s): CHOL, HDL, LDLCALC, TRIG, CHOLHDL, LDLDIRECT in the last 72 hours.  Thyroid Function Tests: No results for input(s): TSH, T4TOTAL, FREET4, T3FREE, THYROIDAB in the last 72 hours.  Anemia Panel: No results for input(s): VITAMINB12, FOLATE, FERRITIN, TIBC, IRON, RETICCTPCT in the last 72 hours.  Urine analysis:    Component Value Date/Time   COLORURINE AMBER (A) 11/16/2020 1312   APPEARANCEUR TURBID (A) 11/16/2020 1312   LABSPEC 1.027 11/16/2020 1312   PHURINE 5.0 11/16/2020 1312   GLUCOSEU NEGATIVE 11/16/2020 1312   HGBUR NEGATIVE 11/16/2020 1312   BILIRUBINUR  NEGATIVE 11/16/2020 1312   KETONESUR NEGATIVE 11/16/2020 1312   PROTEINUR 100 (A) 11/16/2020 1312   NITRITE NEGATIVE 11/16/2020 1312   LEUKOCYTESUR LARGE (A) 11/16/2020 1312    Sepsis Labs: Lactic Acid, Venous    Component Value Date/Time   LATICACIDVEN 1.9 11/16/2020 1312    MICROBIOLOGY: Recent Results (from the past 240 hour(s))  Resp Panel by RT-PCR (Flu A&B, Covid) Nasopharyngeal Swab     Status: Abnormal   Collection Time: 11/16/20  1:12 PM   Specimen: Nasopharyngeal Swab; Nasopharyngeal(NP) swabs in vial transport medium  Result Value Ref Range Status   SARS Coronavirus 2 by RT PCR POSITIVE (A) NEGATIVE Final    Comment: RESULT CALLED TO, READ BACK BY AND VERIFIED WITH: RN M.EINIK AT 1523 ON  11/16/2020 BY T.SAAD (NOTE) SARS-CoV-2 target nucleic acids are DETECTED.  The SARS-CoV-2 RNA is generally detectable in upper respiratory specimens during the acute phase of infection. Positive results are indicative of the presence of the identified virus, but do not rule out bacterial infection or co-infection with other pathogens not detected by the test. Clinical correlation with patient history and other diagnostic information is necessary to determine patient infection status. The expected result is Negative.  Fact Sheet for Patients: EntrepreneurPulse.com.au  Fact Sheet for Healthcare Providers: IncredibleEmployment.be  This test is not yet approved or cleared by the [place] FDA and  has been authorized for detection and/or diagnosis of SARS-CoV-2 by FDA under an Emergency Use Authorization (EUA).  This EUA will remain in effect (meaning this te st can be used) for the duration of  the COVID-19 declaration under Section 564(b)(1) of the Act, 21 U.S.C. section 360bbb-3(b)(1), unless the authorization is terminated or revoked sooner.     Influenza A by PCR NEGATIVE NEGATIVE Final   Influenza B by PCR NEGATIVE NEGATIVE Final     Comment: (NOTE) The Xpert Xpress SARS-CoV-2/FLU/RSV plus assay is intended as an aid in the diagnosis of influenza from Nasopharyngeal swab specimens and should not be used as a sole basis for treatment. Nasal washings and aspirates are unacceptable for Xpert Xpress SARS-CoV-2/FLU/RSV testing.  Fact Sheet for Patients: EntrepreneurPulse.com.au  Fact Sheet for Healthcare Providers: IncredibleEmployment.be  This test is not yet approved or cleared by the [place] FDA and has been authorized for detection and/or diagnosis of SARS-CoV-2 by FDA under an Emergency Use Authorization (EUA). This EUA will remain in effect (meaning this test can be used) for the duration of the COVID-19 declaration under Section 564(b)(1) of the Act, 21 U.S.C. section 360bbb-3(b)(1), unless the authorization is terminated or revoked.  Performed at [facility], [place] [address] [place], [place] [address]   Blood Culture (routine x 2)     Status: None   Collection Time: 11/16/20  1:12 PM   Specimen: BLOOD RIGHT FOREARM  Result Value Ref Range Status   Specimen Description BLOOD RIGHT FOREARM  Final   Special Requests   Final    BOTTLES DRAWN AEROBIC AND ANAEROBIC Blood Culture adequate volume   Culture   Final    NO GROWTH 5 DAYS Performed at [facility], [place] [address], [place], [place] [address]    Report Status 11/21/2020 FINAL  Final  Urine culture     Status: Abnormal   Collection Time: 11/16/20  1:12 PM   Specimen: In/Out Cath Urine  Result Value Ref Range Status   Specimen Description IN/OUT CATH URINE  Final   Special Requests   Final    NONE Performed at [facility], [place] [address], [place], [place] [address]    Culture MULTIPLE SPECIES PRESENT, SUGGEST RECOLLECTION (A)  Final   Report Status 11/18/2020 FINAL  Final  Blood Culture (routine x 2)     Status: None   Collection Time: 11/16/20  1:17 PM   Specimen: BLOOD RIGHT WRIST   Result Value Ref Range Status   Specimen Description BLOOD RIGHT WRIST  Final   Special Requests   Final    BOTTLES DRAWN AEROBIC AND ANAEROBIC Blood Culture adequate volume   Culture   Final    NO GROWTH 5 DAYS Performed at [facility], [place] [address] [place], [place] [address]    Report Status 11/21/2020 FINAL  Final  MRSA PCR Screening     Status: None   Collection Time: 11/17/20 11:39 PM   Specimen: Nasal Mucosa; Nasopharyngeal  Result Value Ref Range Status   MRSA by PCR NEGATIVE NEGATIVE Final    Comment:        The GeneXpert MRSA Assay (FDA approved for NASAL specimens only), is one component of a comprehensive MRSA colonization surveillance program. It is not intended to diagnose MRSA infection nor to guide or monitor treatment for MRSA infections. Performed at [facility], [place] [address], [place], [place] [address]     RADIOLOGY STUDIES/RESULTS: No results found.   LOS: 8 days   Oren Binet, MD  [facility]    To contact the attending provider between 7A-7P or the covering provider during after hours 7P-7A, please log into the web site [email or web address] and access using universal [place] password for that web site. If you do not have the password, please call the hospital operator.  11/25/2020, 12:11 PM

## 2020-11-26 DIAGNOSIS — U071 COVID-19: Secondary | ICD-10-CM | POA: Diagnosis not present

## 2020-11-26 DIAGNOSIS — G20 Parkinson's disease: Secondary | ICD-10-CM | POA: Diagnosis not present

## 2020-11-26 DIAGNOSIS — Z515 Encounter for palliative care: Secondary | ICD-10-CM | POA: Diagnosis not present

## 2020-11-26 DIAGNOSIS — G9341 Metabolic encephalopathy: Secondary | ICD-10-CM | POA: Diagnosis not present

## 2020-11-26 MED ORDER — LORAZEPAM 2 MG/ML IJ SOLN: 1.0000 mg | INTRAMUSCULAR | 0 refills | Status: AC | PRN

## 2020-11-26 MED ORDER — MORPHINE SULFATE (PF) 2 MG/ML IV SOLN
2.0000 mg | INTRAVENOUS | 0 refills | Status: AC | PRN
Start: 2020-11-26 — End: ?

## 2020-11-26 NOTE — TOC Transition Note (Signed)
Transition of Care [facility]) - CM/SW Discharge Note   Patient Details  Name: [name] MRN: [id number] Date of Birth: Dec 22, 1955  [facility]) CM/SW Contact:  Benard Halsted, LCSW Phone Number: 11/26/2020, 2:43 PM   Clinical Narrative:    Patient will DC to: [facility] Place Anticipated DC date: 11/26/20 Family notified: Son, [name] Transport by: Corey Harold   Per MD patient ready for DC to [facility]. RN to call report prior to discharge [phone]). RN, patient, patient's family, and facility notified of DC. Discharge Summary sent to facility. DC packet on chart. Ambulance transport requested for patient.   CSW will sign off for now as social work intervention is no longer needed. Please consult [place] again if new needs arise.      Final next level of care: [place] Barriers to Discharge: Barriers Resolved   Patient Goals and CMS Choice Patient states their goals for this hospitalization and ongoing recovery are:: comfort CMS Medicare.gov Compare Post Acute Care list provided to:: Patient Represenative (must comment) Choice offered to / list presented to : Adult Children  Discharge Placement                Patient to be transferred to facility by: [place] Name of family member notified: Son Patient and family notified of of transfer: 11/26/20  Discharge Plan and Services In-house Referral: Clinical Social Work,Hospice / [place] Acute Care Choice: Hospice                      [facility] Agency: Hospice and [place] Date [place]: 11/19/20 Time [place]: 1003 Representative spoke with at [place]: [place] ([place]) Interventions     Readmission Risk Interventions Readmission Risk Prevention Plan 01/02/2020 12/25/2019  Transportation Screening Complete Complete  PCP or Specialist Appt within 5-7 Days Complete Complete  Home Care Screening Complete Complete   Medication Review (RN CM) Complete Complete  Some recent data might be hidden

## 2020-11-26 NOTE — TOC Progression Note (Addendum)
Transition of Care [facility]) - Progression Note    Patient Details  Name: [name] MRN: [id number] Date of Birth: 1955/10/22  Transition of Care [facility]) CM/SW [place], LCSW Phone Number: 11/26/2020, 8:59 AM  Clinical Narrative:    CSW able to reach patient's son, [name], on the phone. CSW provided update that we are waiting on a bed to open at [facility] or [place]. He expressed understanding. CSW asked if patient's niece and brother could also be contacted if [name] is unable to answer the phone and he agreed.   [place] received: [phone]   Expected Discharge Plan: [place] Barriers to Discharge: Hospice Bed not available  Expected Discharge Plan and Services Expected Discharge Plan: [place] In-house Referral: Clinical Social Work,Hospice / [place] Acute Care Choice: Hospice Living arrangements for the past 2 months: [place]: Hospice and [place] Date [place]: 11/19/20 Time [place]: 1003 Representative spoke with at [place]: [place] ([place]) Interventions    Readmission Risk Interventions Readmission Risk Prevention Plan 01/02/2020 12/25/2019  Transportation Screening Complete Complete  PCP or Specialist Appt within 5-7 Days Complete Complete  Home Care Screening Complete Complete  Medication Review (RN CM) Complete Complete  Some recent data might be hidden

## 2020-11-26 NOTE — Progress Notes (Signed)
Report called to [facility] for patient to be transferred. Waiting for [facility] to pick patient up and transport.

## 2020-11-26 NOTE — Progress Notes (Addendum)
PMT follow up.  Chart reviewed.  Will dc to [facility] today for EOL care.  Visited patient at bedside as it is 3:00 pm and she has not yet discharged to [facility].  Her eyes are open, head turned right.  She will speak if you call her by name.  Heart beat is irregular but not rapid. She shows no signs of distress.  Discussed her care with nursing.  She used 2 doses of morphine PRN overnight.   Today she has had only robinul.    Assessment:  Comfortable.  Nearing EOL. Prognosis:  Hours to days. Disposition:  [facility]  Florentina Jenny, PA-C Palliative Medicine Office:  [phone]   15 min.

## 2020-11-26 NOTE — Discharge Summary (Signed)
PATIENT DETAILS Name: [name] Age: 65 y.o. Sex: female Date of Birth: Mar 08, 1956 MRN: [id number]. Admitting Physician: Elodia Florence., MD HEN:IDPOEUM, [place], MD  Admit Date: 11/16/2020 Discharge date: 11/26/2020  Recommendations for Outpatient Follow-up:  1. Optimize comfort measures  Admitted From:  SNF  Disposition: [place]: No  Equipment/Devices: None  Discharge Condition: Stable  CODE STATUS: DNR  Diet recommendation:  Diet Order            Diet - low sodium heart healthy           Diet regular Room service appropriate? Yes; Fluid consistency: Thin  Diet effective now                  Brief Summary: Patient is a 65 y.o. female Parkinson's disease, dementia, HLD, HTN, CAD, chronic systolic heart failure, AICD placement-with ongoing severe failure to thrive syndrome-admitted for evaluation of acute metabolic encephalopathy in the setting of UTI and hyponatremia.  She was started on antibiotics and IV fluids.  She had a aspiration event on 2/24-after extensive discussion with family by hospitalist MD and palliative care-she was subsequently transitioned to comfort measures.  Plans are to discharge residential hospice when bed is available.  Brief Hospital Course: Acute metabolic encephalopathy in the setting of UTI and hyponatremia Aspiration pneumonia Hyponatremia due to dehydration AKI likely hemodynamically mediated Dysphagia Chronic systolic heart failure s/p ICD placement Thrombocytopenia Normocytic anemia Advanced Parkinson's dementia Severe protein calorie malnutrition Recent COVID-19 infection   As noted above-patient had a significant aspiration event on 2/24-she has been transitioned to full comfort measures.  This morning she appears minimally unresponsive but comfortable.  Goals of care are for comfort-family at bedside this morning-they are aware that patient may be discharged to residential hospice  if bed is available.   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 Pt can take now - Per MAR   Trospium Chloride Er Other (See Comments)   Constipation - Per MAR      Medication List    STOP taking these medications   acetaminophen 325 MG tablet Commonly known as: TYLENOL   amLODipine 10 MG tablet Commonly known as: NORVASC   aspirin 81 MG EC tablet   carbidopa-levodopa 25-100 MG tablet Commonly known as: SINEMET IR   carvedilol 12.5 MG tablet Commonly known as: COREG   divalproex 125 MG DR tablet Commonly known as: DEPAKOTE   donepezil 10 MG tablet Commonly known as: ARICEPT   feeding supplement Liqd   guaifenesin 100 MG/5ML syrup Commonly known as: ROBITUSSIN   polyethylene glycol 17 g packet Commonly known as: MIRALAX / GLYCOLAX   rosuvastatin 10 MG tablet Commonly known as: CRESTOR   sertraline 100 MG tablet Commonly known as: ZOLOFT   sodium bicarbonate 650 MG tablet  Systane Balance 0.6 % Soln Generic drug: Propylene Glycol   THERAPEUTIC M PO   Triple Antibiotic [phone] Oint     TAKE these medications   LORazepam 2 MG/ML injection Commonly known as: ATIVAN Inject 0.5 mLs (1 mg total) into the vein every hour as needed for anxiety, sedation or seizure.   morphine 2 MG/ML injection Inject 1 mL (2 mg total) into the  vein every 2 (two) hours as needed.       Allergies  Allergen Reactions  . Sertraline Hcl Other (See Comments)    Swelling. Pt can take now - Per MAR  . Trospium Chloride Er Other (See Comments)    Constipation - Per MAR     Other Procedures/Studies: CT Head Wo Contrast  Result Date: 11/16/2020 CLINICAL DATA:  Mental status change, unknown cause. EXAM: CT HEAD WITHOUT CONTRAST TECHNIQUE: Contiguous axial images were obtained from the base of the skull through the vertex without intravenous contrast. COMPARISON:  Head CT 05/02/2020. FINDINGS: Brain: Moderately severe cerebral atrophy. Moderate cerebellar atrophy. Associated prominence of the ventricles and sulci. Redemonstrated chronic lacunar infarct within the left caudate nucleus. Moderate ill-defined hypoattenuation within the cerebral white matter is nonspecific, but compatible with chronic small vessel ischemic disease. There is no acute intracranial hemorrhage. No demarcated cortical infarct. No extra-axial fluid collection. No evidence of intracranial mass. No midline shift. Vascular: No hyperdense vessel.  Atherosclerotic calcifications. Skull: Normal. Negative for fracture or focal lesion. Sinuses/Orbits: Visualized orbits show no acute finding. No significant paranasal sinus disease. IMPRESSION: No evidence of acute intracranial abnormality. Redemonstrated chronic left basal ganglia lacunar infarct. Moderate cerebral white matter chronic small vessel ischemic disease. Moderately advanced cerebral atrophy with moderate cerebellar atrophy. Electronically Signed   By: Kellie Simmering DO   On: 11/16/2020 14:29   DG CHEST PORT 1 VIEW  Result Date: 11/18/2020 CLINICAL DATA:  Shortness of breath EXAM: PORTABLE CHEST 1 VIEW COMPARISON:  Portable exam 1626 hours compared to 11/16/2020 FINDINGS: LEFT subclavian ICD with leads projecting over RIGHT atrium, RIGHT ventricle, and coronary sinus. Normal heart size post AVR. Mediastinal contours and  pulmonary vascularity normal. Atherosclerotic calcification aorta. LEFT lower lobe infiltrate consistent with pneumonia. Remaining lungs clear. No pleural effusion or pneumothorax. IMPRESSION: LEFT lower lobe infiltrate consistent with pneumonia. Post ICD and AVR. Aortic Atherosclerosis (ICD10-I70.0). Electronically Signed   By: Lavonia Dana M.D.   On: 11/18/2020 16:43   DG Chest Port 1 View  Result Date: 11/16/2020 CLINICAL DATA:  Questionable sepsis, altered mental status EXAM: PORTABLE CHEST 1 VIEW COMPARISON:  05/18/2020 chest radiograph. FINDINGS: Intact sternotomy wires. Cardiac valvular prosthesis in place. Stable configuration of 3 lead left subclavian ICD. Stable cardiomediastinal silhouette with top-normal heart size. No pneumothorax. No pleural effusion. Lungs appear clear, with no acute consolidative airspace disease and no pulmonary edema. IMPRESSION: No active disease. Electronically Signed   By: Ilona Sorrel M.D.   On: 11/16/2020 15:06   EEG adult  Result Date: 11/16/2020 Lora Havens, MD     11/16/2020  4:46 PM Patient Name: [name] MRN: [id number] Epilepsy Attending: Lora Havens Referring Physician/Provider: Dr Lennice Sites Date: 11/16/2020 Duration: 25.15 mins Patient history: 65 year old female with history of heart failure, Parkinson's, dementia, possibly seizures who presents the ED with altered mental status. EEG to evaluate for seizure Level of alertness: Awake AEDs during EEG study: None Technical aspects: This EEG study was done with scalp electrodes positioned according to the 10-20 International system of electrode placement. Electrical activity  was acquired at a sampling rate of 500Hz  and reviewed with a high frequency filter of 70Hz  and a low frequency filter of 1Hz . EEG data were recorded continuously and digitally stored. Description: The posterior dominant rhythm consists of 7.5 Hz activity of moderate voltage (25-35 uV) seen predominantly in posterior head  regions, symmetric and reactive to eye opening and eye closing. EEG showed continuous generalized 5 to 7 Hz theta as well as intermittent 2-3hz  delta slowing. Per tech annotation patient was noted to have left arm twitching which was not visible on camera. Concomitant eeg before, during and after the event didn't show eeg change to suggest seizure.  Hyperventilation and photic stimulation were not performed.   ABNORMALITY -Continuous slow, generalized IMPRESSION: This study is suggestive of mild diffuse encephalopathy, nonspecific etiology. No seizures or epileptiform discharges were seen throughout the recording. Per tech annotation patient was noted to have left arm twitching which was not visible on camera without concomitant eeg change and was most likely not epileptic [place]:  Subjective:   [name] today appears comfortable but barely responsive.  Objective:   Blood pressure (!) 160/113, pulse 91, temperature 97.8 F (36.6 C), temperature source Axillary, resp. rate (!) 21, height 5' (1.524 m), weight 40.6 kg, SpO2 95 %.  Intake/Output Summary (Last 24 hours) at 11/26/2020 0938 Last data filed at 11/26/2020 0530 Gross per 24 hour  Intake 0 ml  Output 350 ml  Net -350 ml   Filed Weights   11/16/20 1318 11/17/20 1415 11/22/20 0401  Weight: 43 kg 40.2 kg 40.6 kg    Exam: Not in any distress.   PERTINENT RADIOLOGIC STUDIES: No results found.   PERTINENT LAB RESULTS: CBC: No results for input(s): WBC, HGB, HCT, PLT in the last 72 hours. CMET CMP     Component Value Date/Time   NA 147 (H) 11/18/2020 0615   NA 139 07/06/2017 1152   K 2.7 (LL) 11/18/2020 0615   CL 118 (H) 11/18/2020 0615   CO2 20 (L) 11/18/2020 0615   GLUCOSE 164 (H) 11/18/2020 0615   BUN 26 (H) 11/18/2020 0615   BUN 12 07/06/2017 1152   CREATININE 0.62 11/18/2020 0615   CREATININE 0.95 07/03/2016 1148   CALCIUM 9.7 11/18/2020 0615   CALCIUM 11.1 (H) 04/22/2020  0119   PROT 6.2 (L) 11/18/2020 0615   ALBUMIN 2.8 (L) 11/18/2020 0615   AST 31 11/18/2020 0615   ALT 48 (H) 11/18/2020 0615   ALKPHOS 48 11/18/2020 0615   BILITOT 0.6 11/18/2020 0615   GFRNONAA >60 11/18/2020 0615   GFRAA >60 05/25/2020 0508    GFR Estimated Creatinine Clearance: 44.3 mL/min (by C-G formula based on SCr of 0.62 mg/dL). No results for input(s): LIPASE, AMYLASE in the last 72 hours. No results for input(s): CKTOTAL, CKMB, CKMBINDEX, TROPONINI in the last 72 hours. Invalid input(s): POCBNP No results for input(s): DDIMER in the last 72 hours. No results for input(s): HGBA1C in the last 72 hours. No results for input(s): CHOL, HDL, LDLCALC, TRIG, CHOLHDL, LDLDIRECT in the last 72 hours. No results for input(s): TSH, T4TOTAL, T3FREE, THYROIDAB in the last 72 hours.  Invalid input(s): FREET3 No results for input(s): VITAMINB12, FOLATE, FERRITIN, TIBC, IRON, RETICCTPCT in the last 72 hours. Coags: No results for input(s): INR in the last 72 hours.  Invalid input(s): PT Microbiology: Recent Results (from the past 240 hour(s))  Resp Panel by RT-PCR (Flu A&B, Covid) Nasopharyngeal Swab  Status: Abnormal   Collection Time: 11/16/20  1:12 PM   Specimen: Nasopharyngeal Swab; Nasopharyngeal(NP) swabs in vial transport medium  Result Value Ref Range Status   SARS Coronavirus 2 by RT PCR POSITIVE (A) NEGATIVE Final    Comment: RESULT CALLED TO, READ BACK BY AND VERIFIED WITH: RN M.EINIK AT 1523 ON 11/16/2020 BY T.SAAD (NOTE) SARS-CoV-2 target nucleic acids are DETECTED.  The SARS-CoV-2 RNA is generally detectable in upper respiratory specimens during the acute phase of infection. Positive results are indicative of the presence of the identified virus, but do not rule out bacterial infection or co-infection with other pathogens not detected by the test. Clinical correlation with patient history and other diagnostic information is necessary to determine  patient infection status. The expected result is Negative.  Fact Sheet for Patients: EntrepreneurPulse.com.au  Fact Sheet for Healthcare Providers: IncredibleEmployment.be  This test is not yet approved or cleared by the [place] FDA and  has been authorized for detection and/or diagnosis of SARS-CoV-2 by FDA under an Emergency Use Authorization (EUA).  This EUA will remain in effect (meaning this te st can be used) for the duration of  the COVID-19 declaration under Section 564(b)(1) of the Act, 21 U.S.C. section 360bbb-3(b)(1), unless the authorization is terminated or revoked sooner.     Influenza A by PCR NEGATIVE NEGATIVE Final   Influenza B by PCR NEGATIVE NEGATIVE Final    Comment: (NOTE) The Xpert Xpress SARS-CoV-2/FLU/RSV plus assay is intended as an aid in the diagnosis of influenza from Nasopharyngeal swab specimens and should not be used as a sole basis for treatment. Nasal washings and aspirates are unacceptable for Xpert Xpress SARS-CoV-2/FLU/RSV testing.  Fact Sheet for Patients: EntrepreneurPulse.com.au  Fact Sheet for Healthcare Providers: IncredibleEmployment.be  This test is not yet approved or cleared by the [place] FDA and has been authorized for detection and/or diagnosis of SARS-CoV-2 by FDA under an Emergency Use Authorization (EUA). This EUA will remain in effect (meaning this test can be used) for the duration of the COVID-19 declaration under Section 564(b)(1) of the Act, 21 U.S.C. section 360bbb-3(b)(1), unless the authorization is terminated or revoked.  Performed at [facility], [place] [address] [place], [place] [address]   Blood Culture (routine x 2)     Status: None   Collection Time: 11/16/20  1:12 PM   Specimen: BLOOD RIGHT FOREARM  Result Value Ref Range Status   Specimen Description BLOOD RIGHT FOREARM  Final   Special Requests   Final    BOTTLES  DRAWN AEROBIC AND ANAEROBIC Blood Culture adequate volume   Culture   Final    NO GROWTH 5 DAYS Performed at [facility], [place] [address] [place], [place] [address]    Report Status 11/21/2020 FINAL  Final  Urine culture     Status: Abnormal   Collection Time: 11/16/20  1:12 PM   Specimen: In/Out Cath Urine  Result Value Ref Range Status   Specimen Description IN/OUT CATH URINE  Final   Special Requests   Final    NONE Performed at [facility], [place] [address] [place], [place] [address]    Culture MULTIPLE SPECIES PRESENT, SUGGEST RECOLLECTION (A)  Final   Report Status 11/18/2020 FINAL  Final  Blood Culture (routine x 2)     Status: None   Collection Time: 11/16/20  1:17 PM   Specimen: BLOOD RIGHT WRIST  Result Value Ref Range Status   Specimen Description BLOOD RIGHT WRIST  Final   Special Requests   Final    BOTTLES DRAWN AEROBIC AND ANAEROBIC Blood Culture adequate volume   Culture   Final    NO GROWTH 5 DAYS Performed at [facility], [address] [place], [place] [address]    Report Status 11/21/2020 FINAL  Final  MRSA PCR Screening     Status: None   Collection Time: 11/17/20 11:39 PM   Specimen: Nasal Mucosa; Nasopharyngeal  Result Value Ref Range Status   MRSA by PCR NEGATIVE NEGATIVE Final    Comment:        The GeneXpert MRSA Assay (FDA approved for NASAL specimens only), is one component of a comprehensive MRSA colonization surveillance program. It is not intended to diagnose MRSA infection nor to guide or monitor treatment for MRSA infections. Performed at [facility], [place] [address] [place], [place] [address]     FURTHER DISCHARGE INSTRUCTIONS:  Get Medicines reviewed and adjusted: Please take all your medications with you for your next visit with your Primary MD  Laboratory/radiological data: Please request your Primary MD to go over all hospital tests and procedure/radiological results at the follow up, please ask  your Primary MD to get all Hospital records sent to his/her office.  In some cases, they will be blood work, cultures and biopsy results pending at the time of your discharge. Please request that your primary care M.D. goes through all the records of your hospital data and follows up on these results.  Also Note the following: If you experience worsening of your admission symptoms, develop shortness of breath, life threatening emergency, suicidal or homicidal thoughts you must seek medical attention immediately by calling 911 or calling your MD immediately  if symptoms less severe.  You must read complete instructions/literature along with all the possible adverse reactions/side effects for all the Medicines you take and that have been prescribed to you. Take any new Medicines after you have completely understood and accpet all the possible adverse reactions/side effects.   Do not drive when taking Pain medications or sleeping medications (Benzodaizepines)  Do not take more than prescribed Pain, Sleep and Anxiety Medications. It is not advisable to combine anxiety,sleep and pain medications without talking with your primary care practitioner  Special Instructions: If you have smoked or chewed Tobacco  in the last 2 yrs please stop smoking, stop any regular Alcohol  and or any Recreational drug use.  Wear Seat belts while driving.  Please note: You were cared for by a hospitalist during your hospital stay. Once you are discharged, your primary care physician will handle any further medical issues. Please note that NO REFILLS for any discharge medications will be authorized once you are discharged, as it is imperative that you return to your primary care physician (or establish a relationship with a primary care physician if you do not have one) for your post hospital discharge needs so that they can reassess your need for medications and monitor your lab values.  Total Time spent coordinating  discharge including counseling, education and face to face time equals 25 minutes.  SignedOren Binet 11/26/2020 9:38 AM

## 2020-11-26 NOTE — Plan of Care (Signed)
  Problem: Education: Goal: Knowledge of General Education information will improve Description: Including pain rating scale, medication(s)/side effects and non-pharmacologic comfort measures Outcome: Adequate for Discharge   Problem: Health Behavior/Discharge Planning: Goal: Ability to manage health-related needs will improve Outcome: Adequate for Discharge   Problem: Clinical Measurements: Goal: Ability to maintain clinical measurements within normal limits will improve Outcome: Adequate for Discharge Goal: Will remain free from infection Outcome: Adequate for Discharge Goal: Diagnostic test results will improve Outcome: Adequate for Discharge Goal: Respiratory complications will improve Outcome: Adequate for Discharge Goal: Cardiovascular complication will be avoided Outcome: Adequate for Discharge   Problem: Activity: Goal: Risk for activity intolerance will decrease Outcome: Adequate for Discharge   Problem: Nutrition: Goal: Adequate nutrition will be maintained Outcome: Adequate for Discharge   Problem: Coping: Goal: Level of anxiety will decrease Outcome: Adequate for Discharge   Problem: Elimination: Goal: Will not experience complications related to bowel motility Outcome: Adequate for Discharge Goal: Will not experience complications related to urinary retention Outcome: Adequate for Discharge   Problem: Pain Managment: Goal: General experience of comfort will improve Outcome: Adequate for Discharge   Problem: Safety: Goal: Ability to remain free from injury will improve Outcome: Adequate for Discharge   Problem: Skin Integrity: Goal: Risk for impaired skin integrity will decrease Outcome: Adequate for Discharge   Problem: Education: Goal: Ability to demonstrate management of disease process will improve Outcome: Adequate for Discharge Goal: Ability to verbalize understanding of medication therapies will improve Outcome: Adequate for Discharge Goal:  Individualized Educational Video(s) Outcome: Adequate for Discharge   Problem: Activity: Goal: Capacity to carry out activities will improve Outcome: Adequate for Discharge   Problem: Cardiac: Goal: Ability to achieve and maintain adequate cardiopulmonary perfusion will improve Outcome: Adequate for Discharge   Problem: Education: Goal: Knowledge of the prescribed therapeutic regimen will improve Outcome: Adequate for Discharge   Problem: Coping: Goal: Ability to identify and develop effective coping behavior will improve Outcome: Adequate for Discharge   Problem: Clinical Measurements: Goal: Quality of life will improve Outcome: Adequate for Discharge   Problem: Respiratory: Goal: Verbalizations of increased ease of respirations will increase Outcome: Adequate for Discharge   Problem: Role Relationship: Goal: Family's ability to cope with current situation will improve Outcome: Adequate for Discharge Goal: Ability to verbalize concerns, feelings, and thoughts to partner or family member will improve Outcome: Adequate for Discharge   Problem: Pain Management: Goal: Satisfaction with pain management regimen will improve Outcome: Adequate for Discharge

## 2020-11-26 NOTE — Progress Notes (Addendum)
Manufacturing engineer [facility]) Hospital Liaison note.    *Addendum: 2:00pm: Consents are complete at this time .  Please feel free to arrange transport.  Please be sure the signed [facility] DNR form transports with the patient.  Thank you!  Received request from [place] for family interest in [facility]. Chart reviewed and eligibility confirmed. Spoke with family to confirm interest and explain services. Family agreeable to transfer today. Discussed with son Gerald Stabs that she could go to [facility] if that woud be better for the family, but he shared that traveling to [facility] would be a difficulty for them and that [facility] is more convenient for the family. TOC aware.   [facility] will notify TOC when registration paperwork has been completed to arrange transport.   RN please call report to [phone].  Thank you for the opportunity to participate in this patients care.  Domenic Moras, BSN, RN [facility] Liaison (listed on [place] under Hospice[facility])    [phone] (24h on call)

## 2020-12-24 DEATH — deceased

## 2021-08-05 IMAGING — DX DG KNEE 3 VIEWS*L*
4 series · 4 of 4 positions shown · non-contrast
Comparison: None.

CLINICAL DATA: Fall.  Pain.

EXAM:
LEFT KNEE - 3 VIEW

[knee ap (1 of 3)]
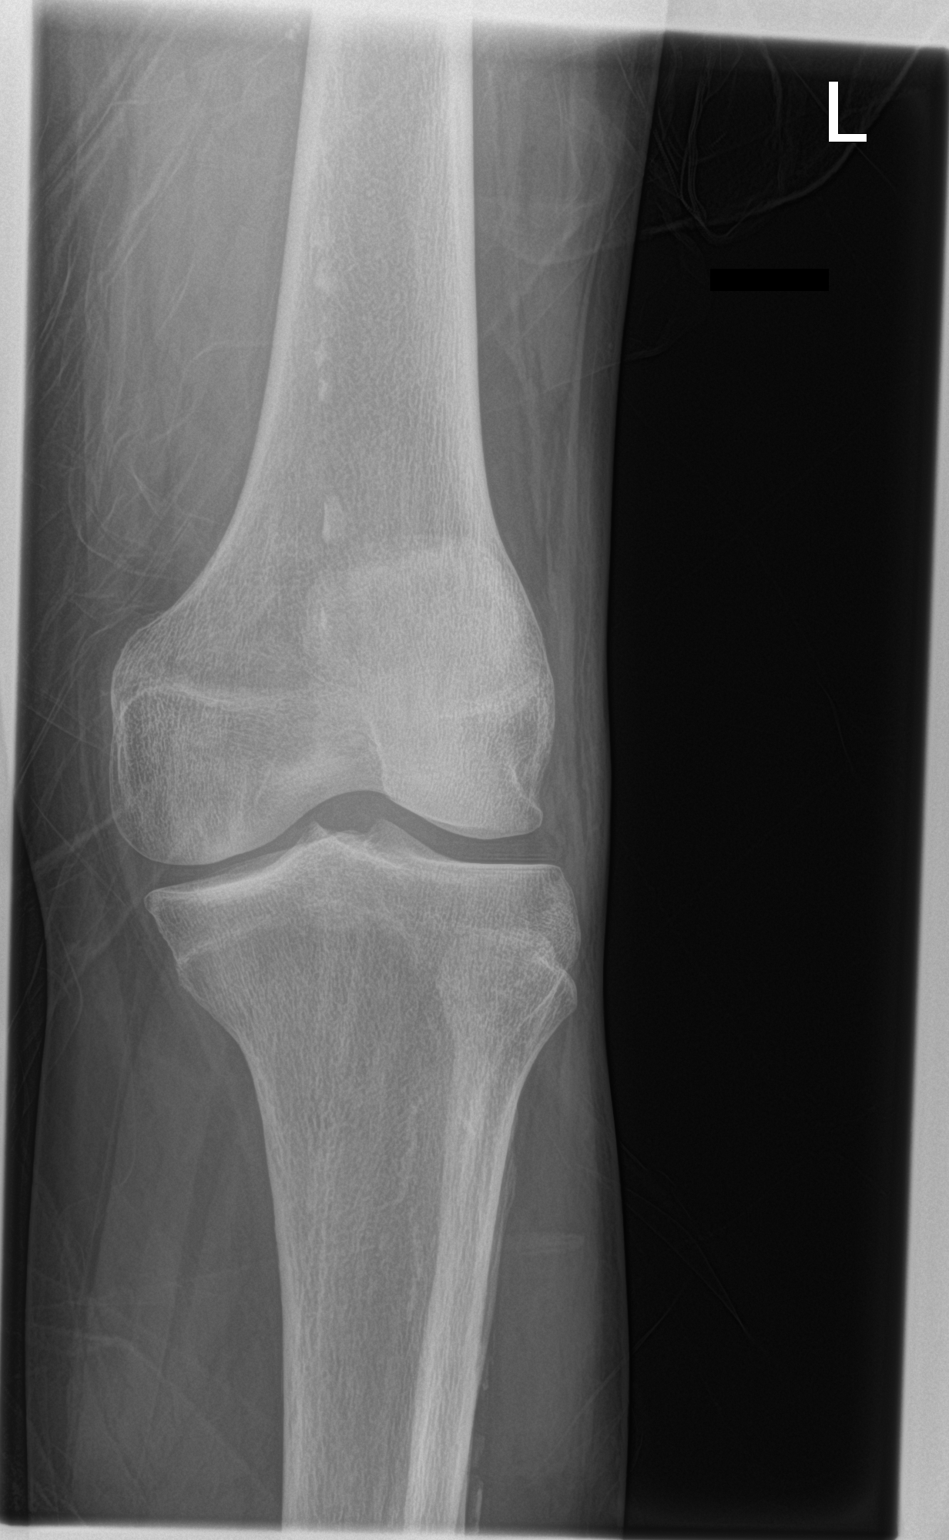

[knee lat]
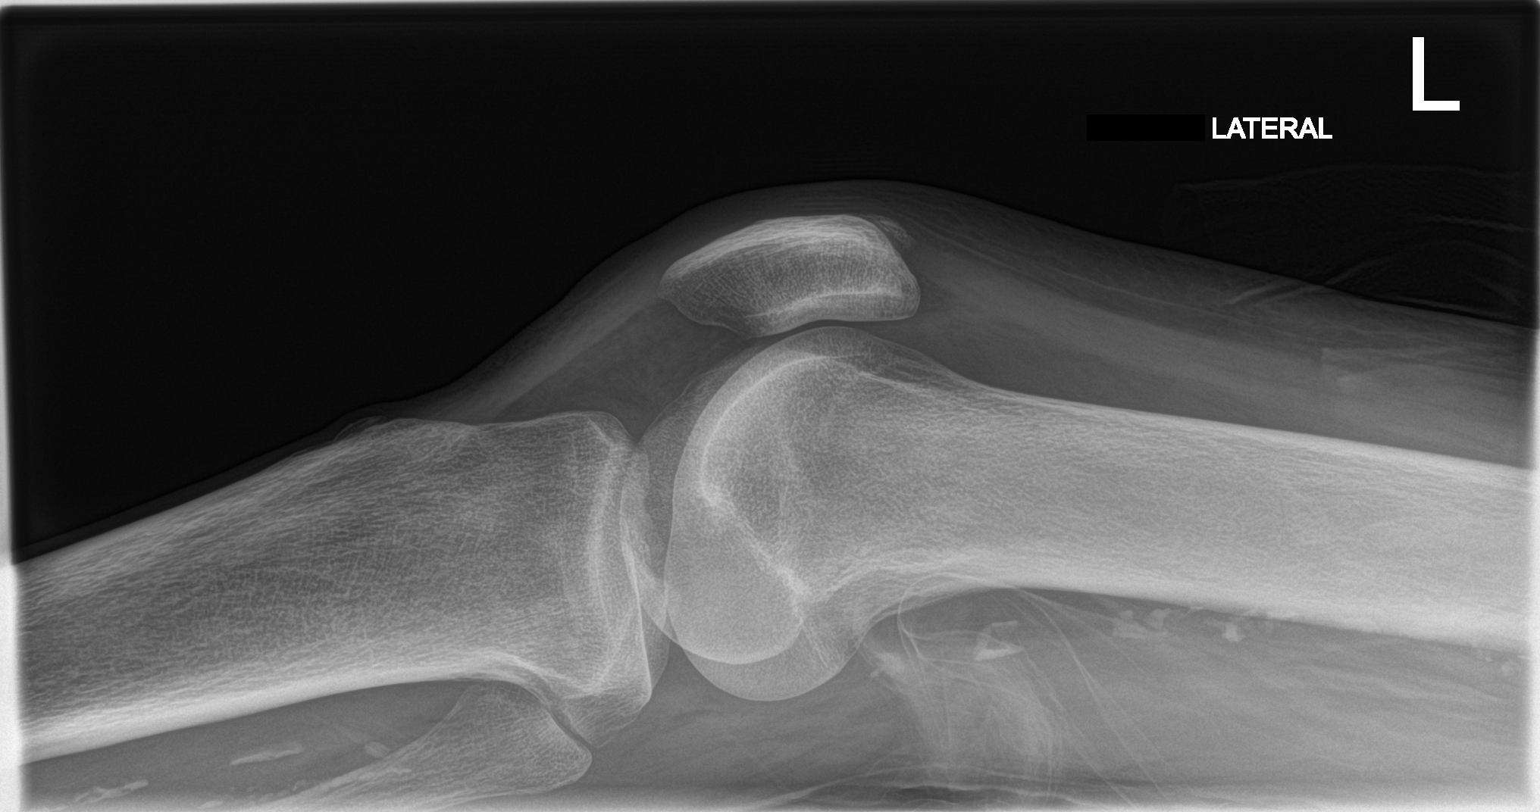

[knee ap (2 of 3)]
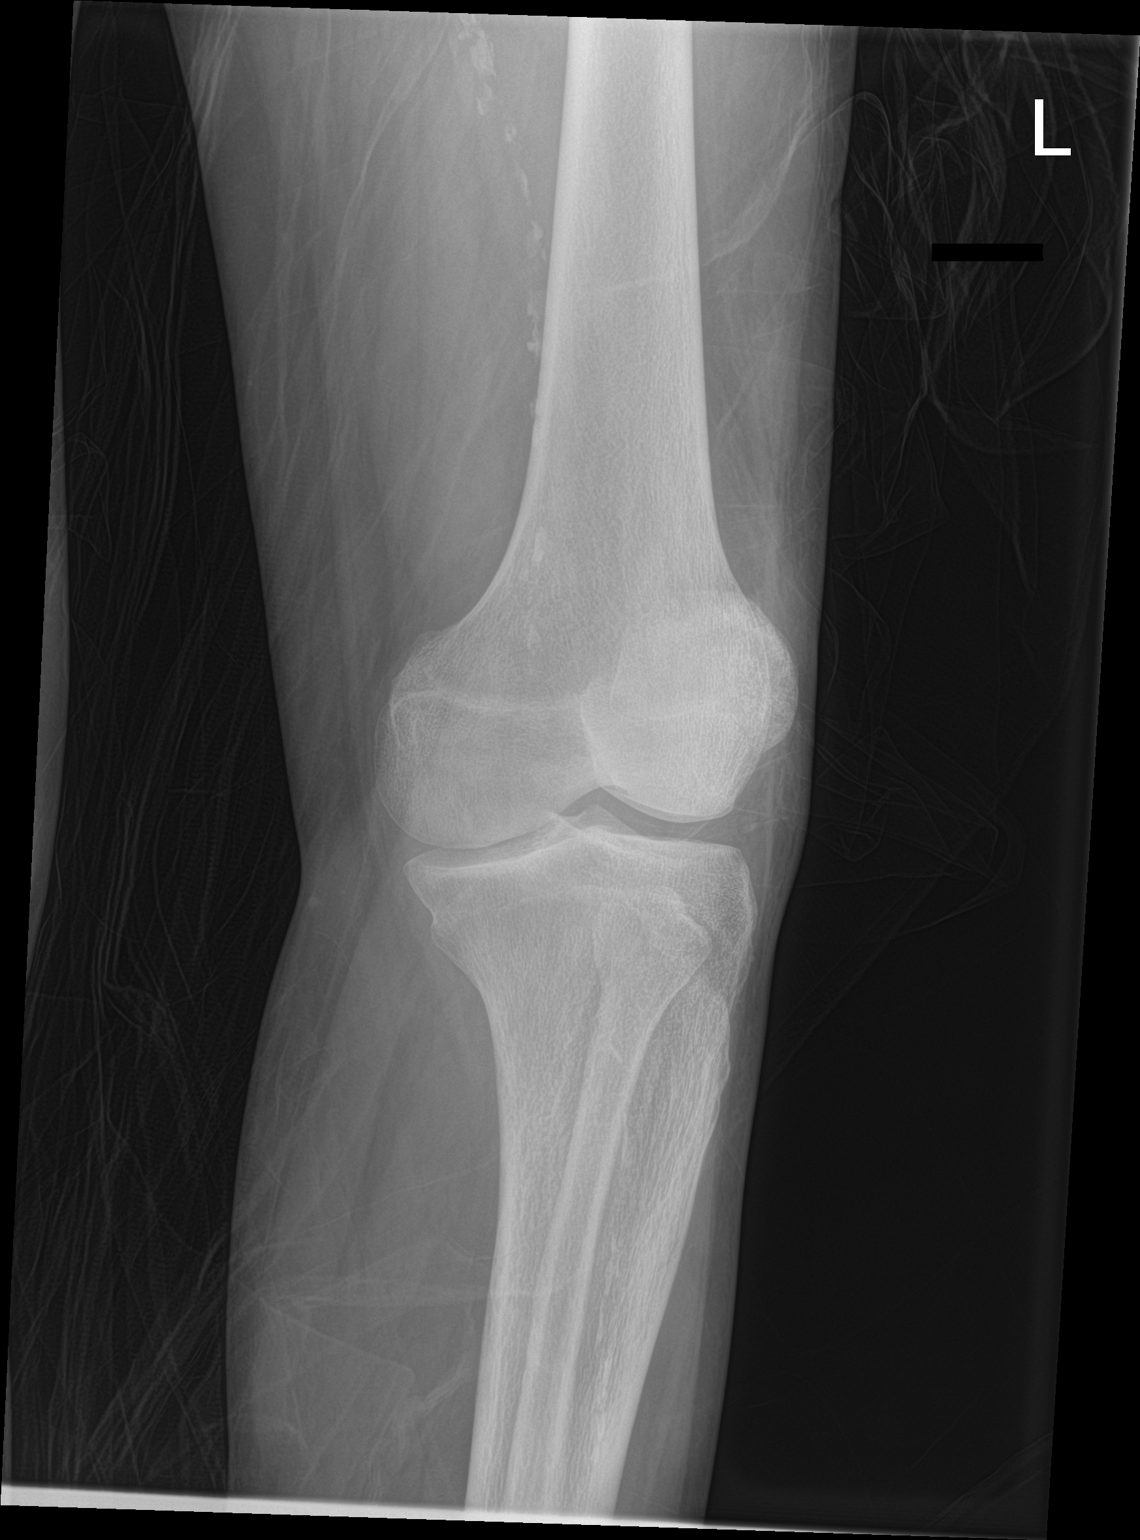

[knee ap (3 of 3)]
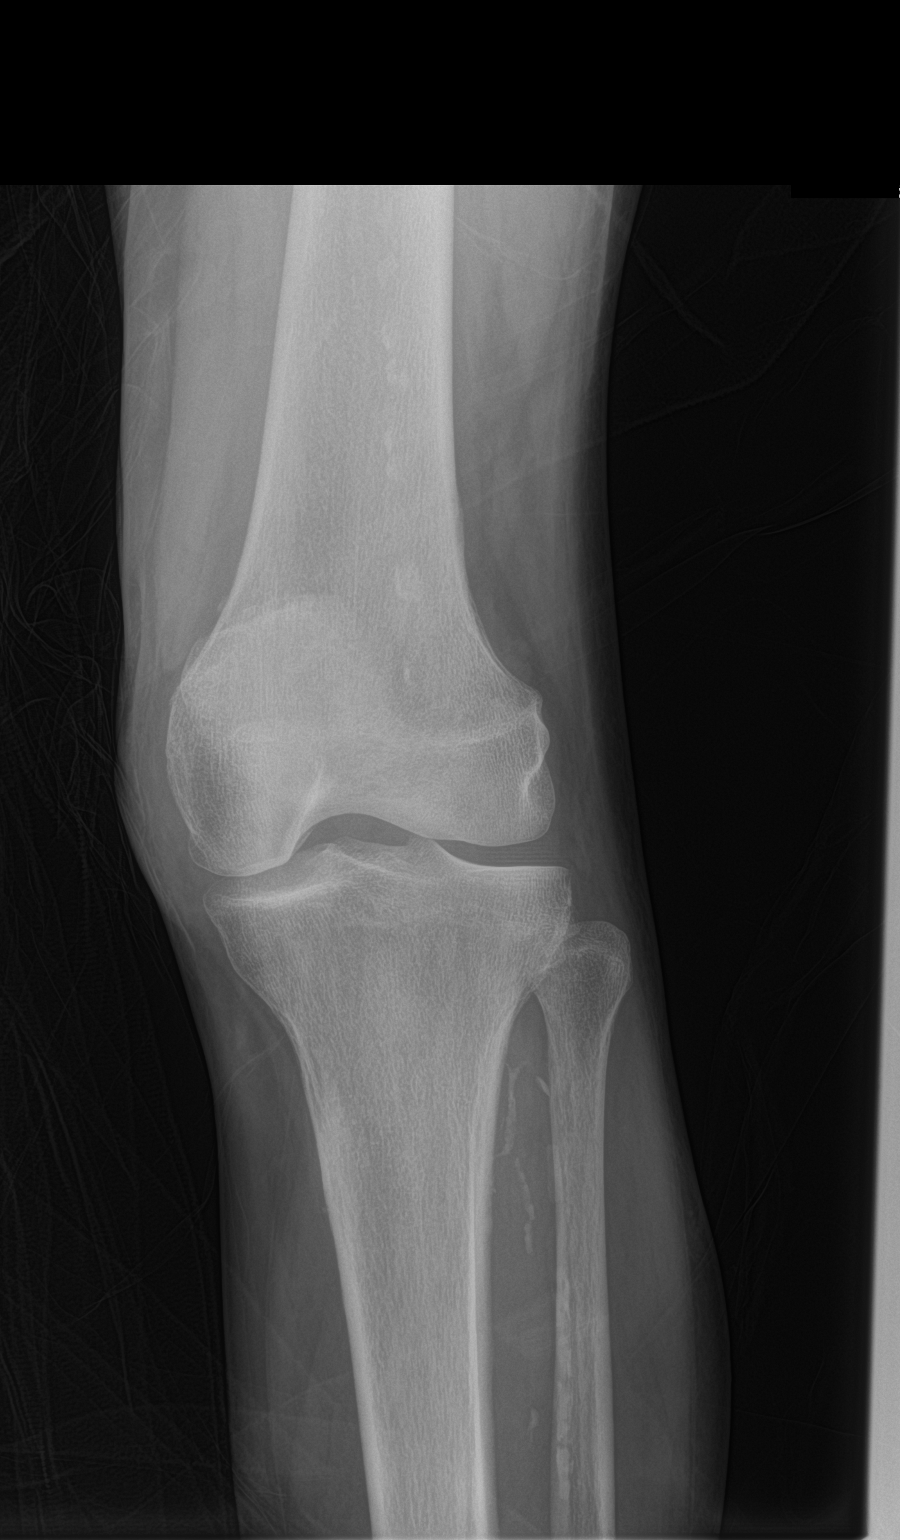

[4 of 4 positions shown; findings below may reference images not displayed]

FINDINGS: No fracture. No subluxation or dislocation. No joint effusion.
Meniscal mineralization evident.
IMPRESSION: Negative.

## 2021-08-06 IMAGING — DX DG PORTABLE PELVIS
2 series · 2 of 2 positions shown · non-contrast
Comparison: Intraoperative fluoroscopic study dated 12/31/2019.

CLINICAL DATA: 63-year-old female status post left hip
arthroplasty.

EXAM:
PORTABLE PELVIS 1-2 VIEWS

[pelvis ap]
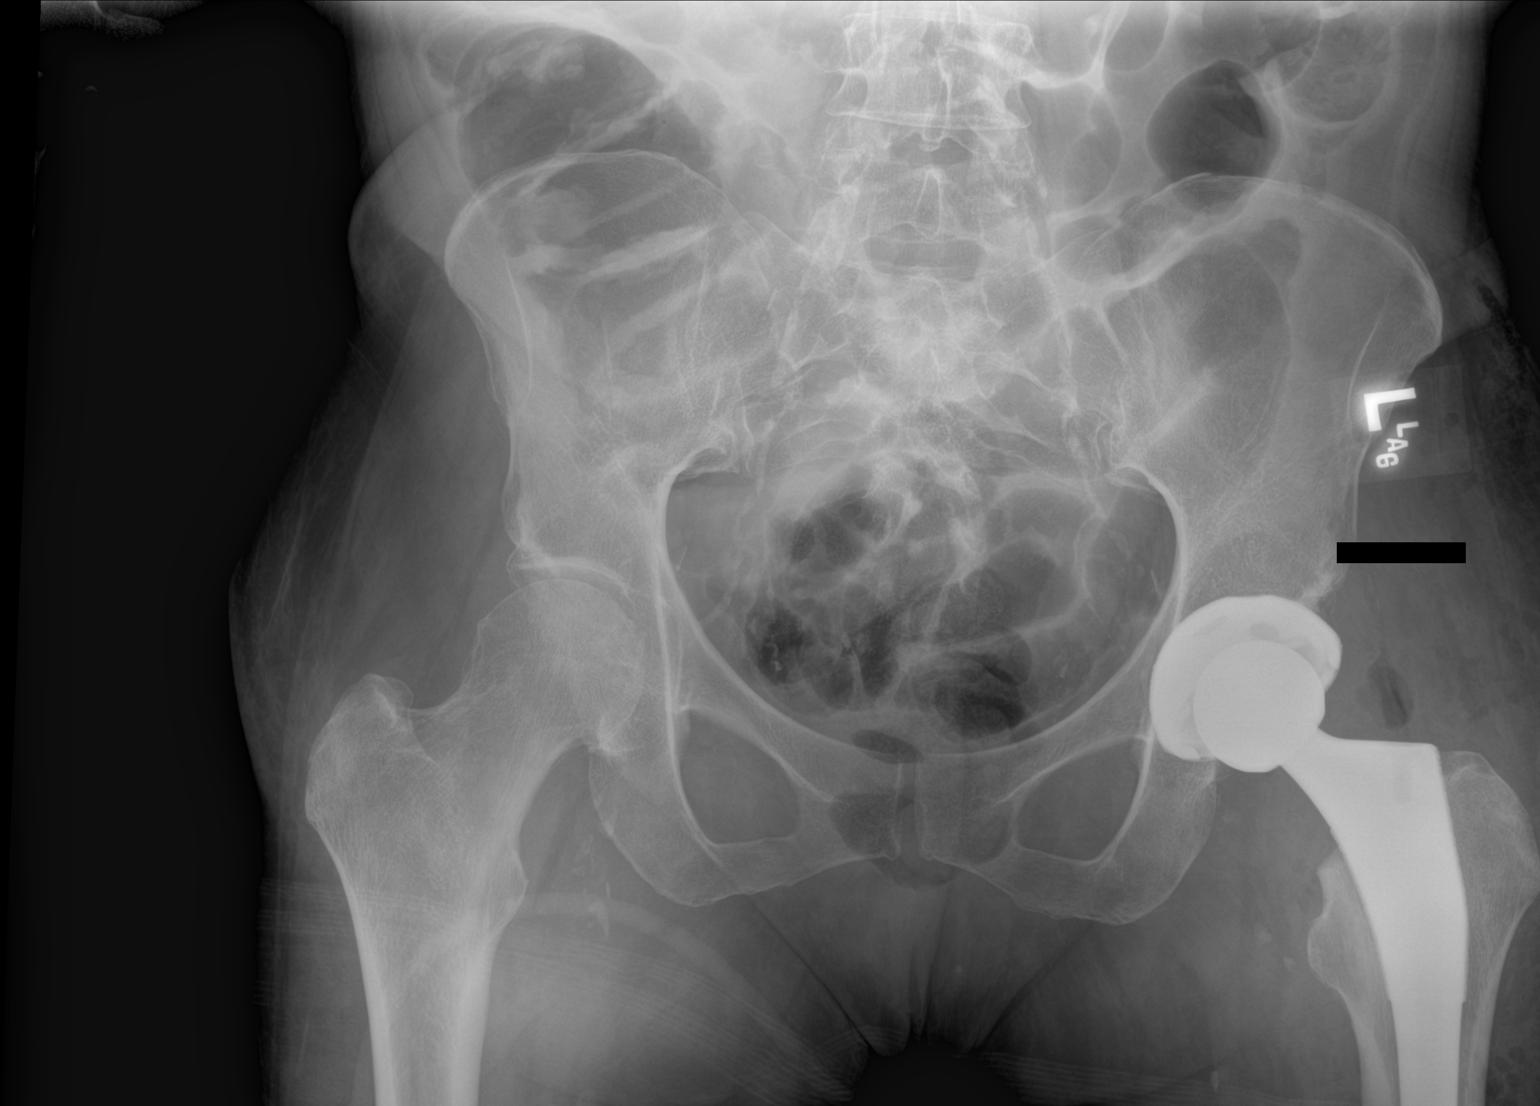

[pelvis lat]
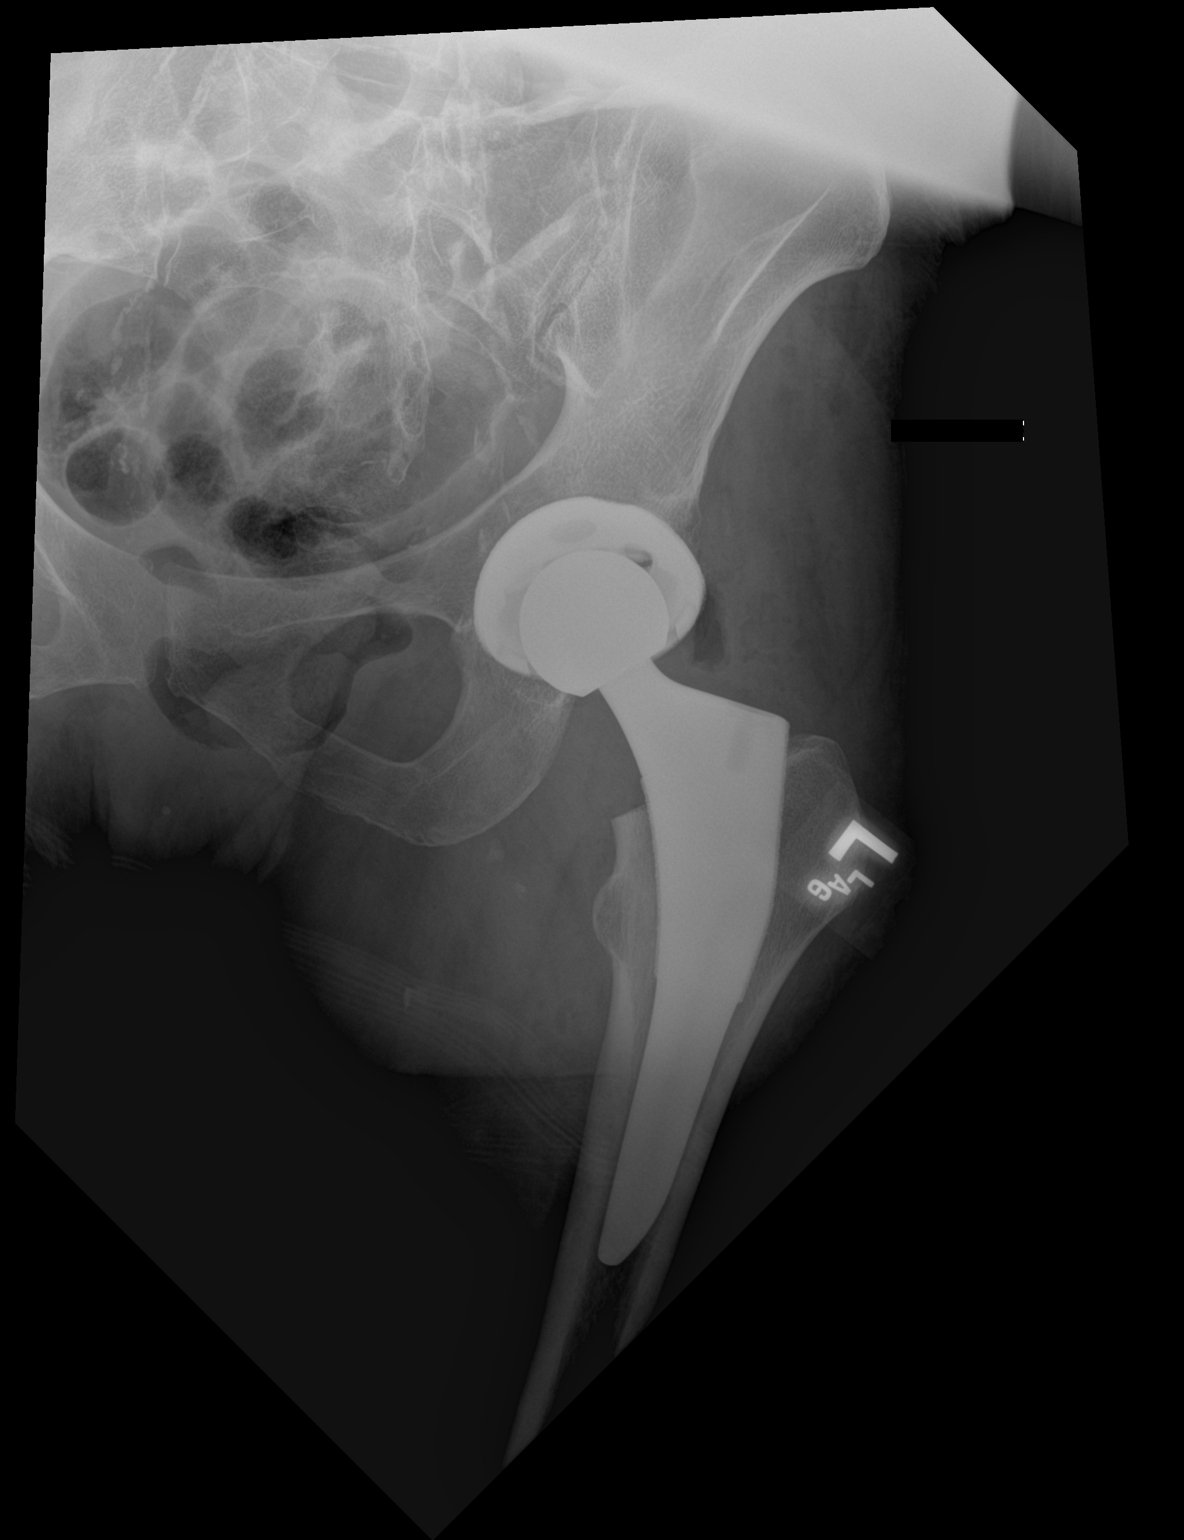

[2 of 2 positions shown; findings below may reference images not displayed]

FINDINGS: There is a total left hip arthroplasty. The arthroplasty components
appear intact and in anatomic alignment. There is no acute fracture
or dislocation. Mild right hip arthritic changes. Vascular
calcifications noted. Postsurgical changes in the soft tissues of
the left hip.
IMPRESSION: Status post total left hip arthroplasty. No complication.

## 2021-08-06 IMAGING — RF DG HIP (WITH PELVIS) OPERATIVE*L*
1 series · 2 of 2 positions shown · non-contrast
Comparison: None.

CLINICAL DATA: Left hip replacement.

EXAM:
OPERATIVE LEFT HIP (WITH PELVIS IF PERFORMED)  VIEWS
TECHNIQUE: Fluoroscopic spot image(s) were submitted for interpretation
post-operatively.

[Series 1: unknown protocol · 0.20mm/px · 2 of 2 slices shown]
[im 1/2]
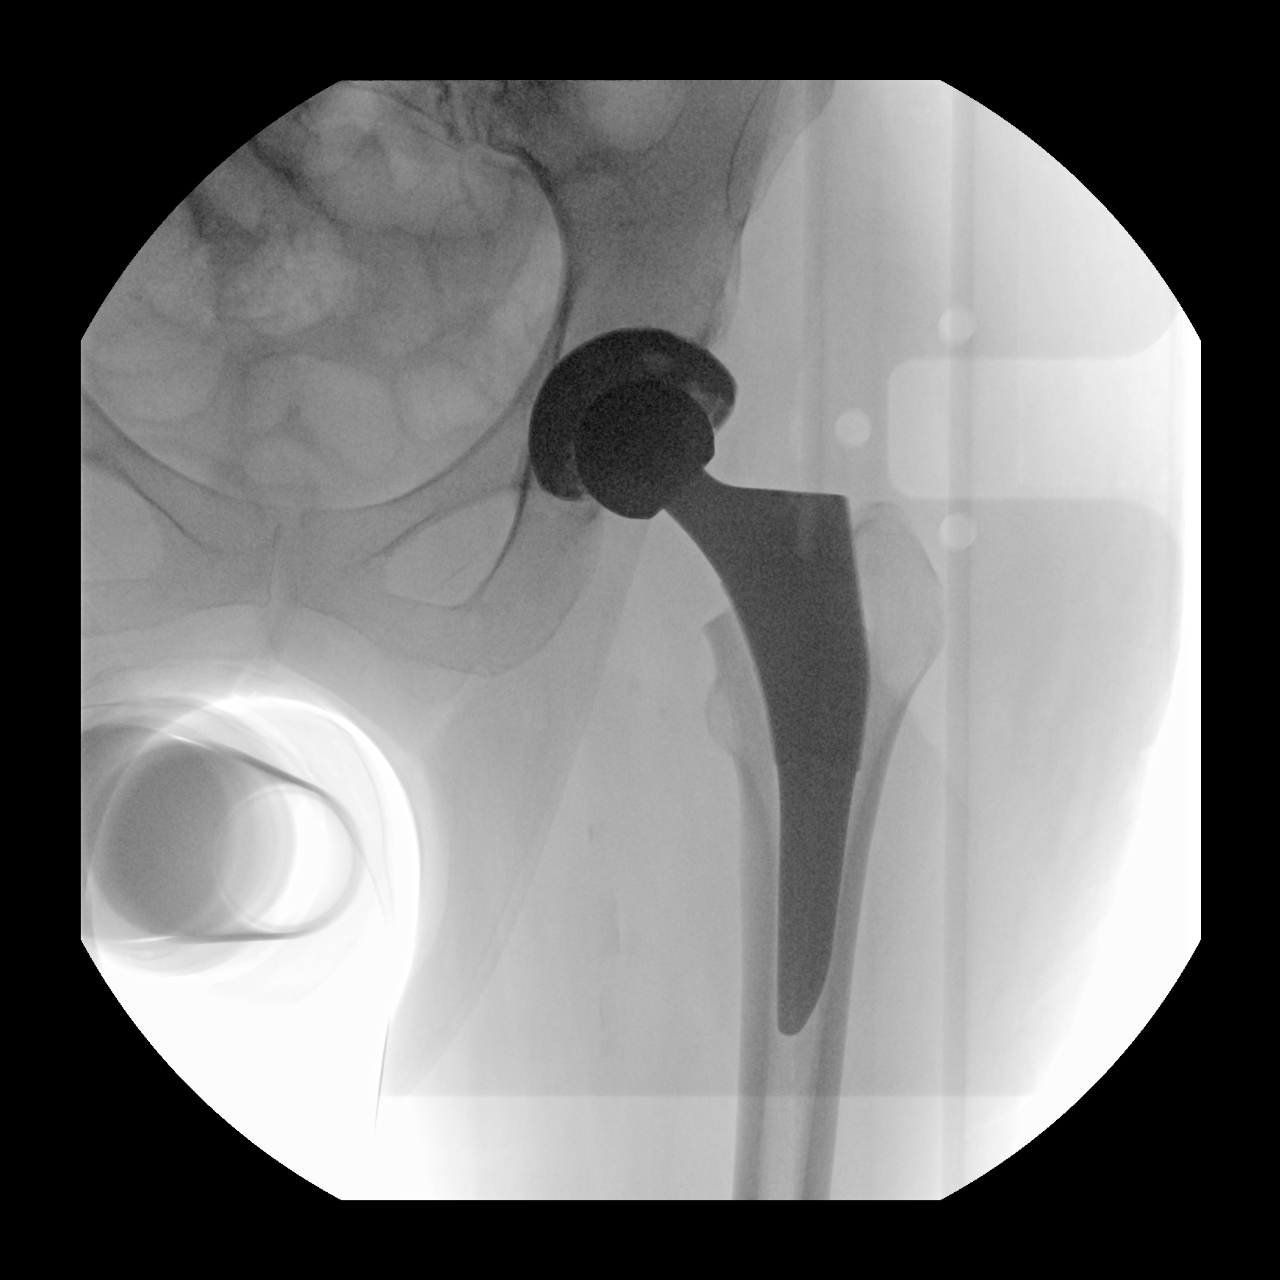
[im 2/2]
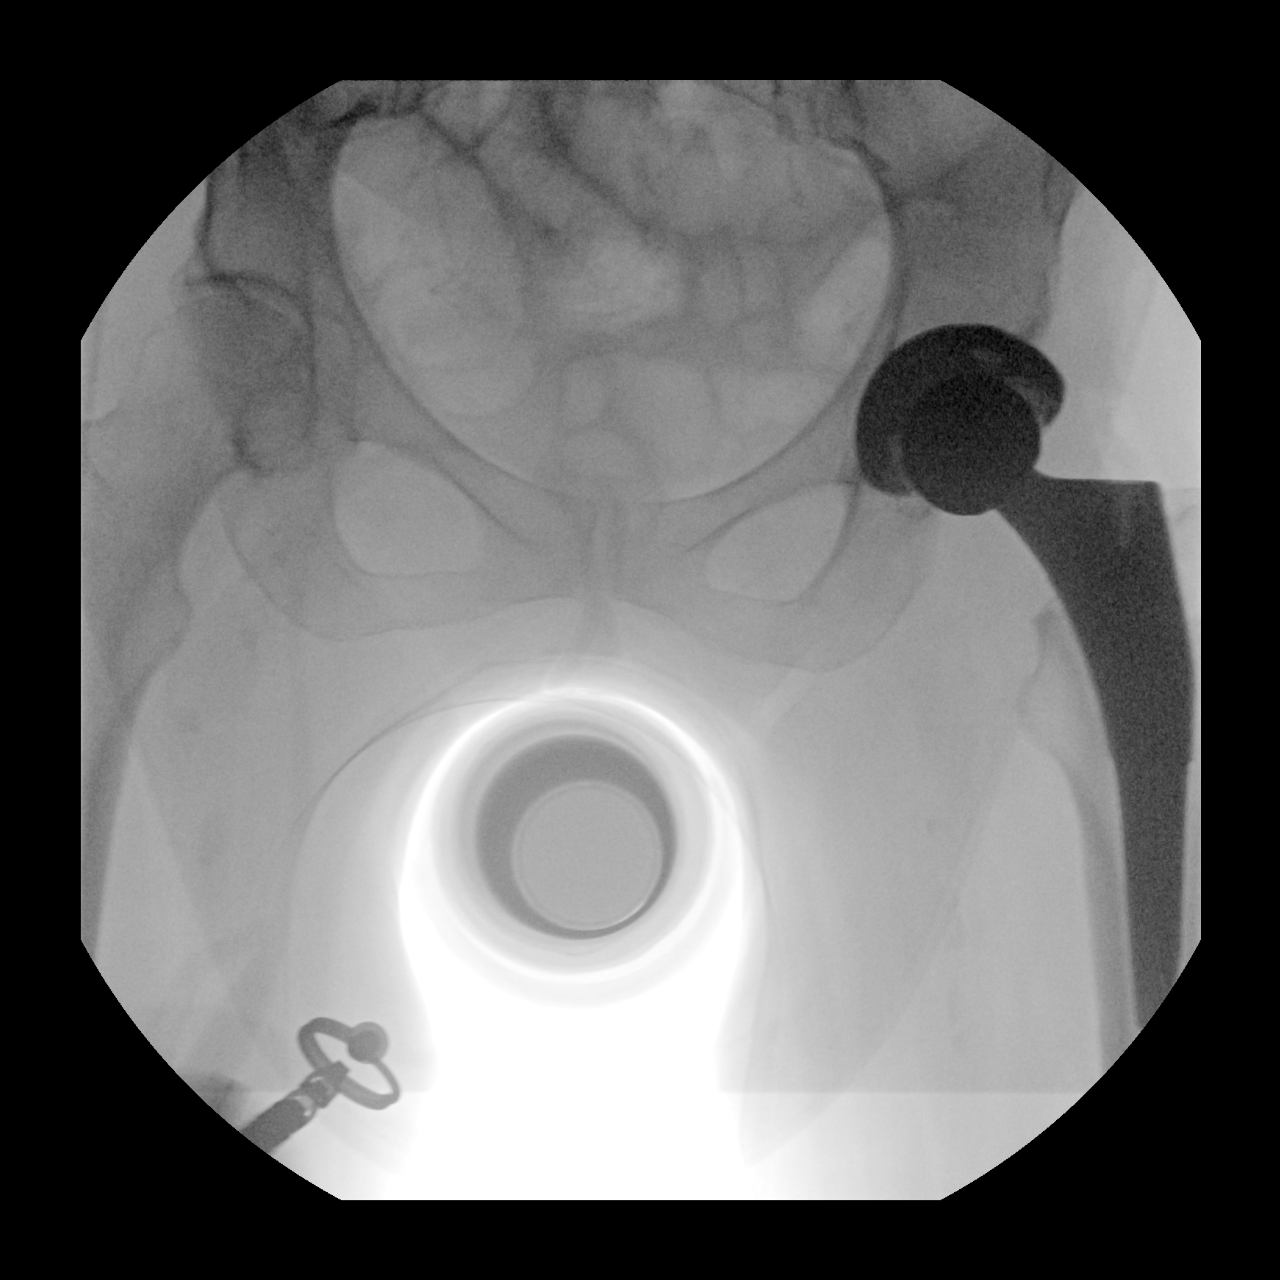

[2 of 2 positions shown; findings below may reference images not displayed]

FINDINGS: A total left hip replacement is seen. There is no evidence of
surrounding lucency to suggest the presence of hardware loosening or
infection. The remaining visualized osseous and soft tissue
structures are unremarkable.
IMPRESSION: Left hip replacement without evidence of hardware complication.
